# Patient Record
Sex: Male | Born: 1955
Health system: Southern US, Community
[De-identification: ages and names within clinical notes are randomized; demographics above are authoritative.]

## PROBLEM LIST (undated history)

## (undated) DIAGNOSIS — M109 Gout, unspecified: Secondary | ICD-10-CM

## (undated) DIAGNOSIS — J189 Pneumonia, unspecified organism: Secondary | ICD-10-CM

## (undated) DIAGNOSIS — R0600 Dyspnea, unspecified: Secondary | ICD-10-CM

## (undated) DIAGNOSIS — K219 Gastro-esophageal reflux disease without esophagitis: Secondary | ICD-10-CM

## (undated) DIAGNOSIS — L97909 Non-pressure chronic ulcer of unspecified part of unspecified lower leg with unspecified severity: Secondary | ICD-10-CM

## (undated) DIAGNOSIS — I10 Essential (primary) hypertension: Secondary | ICD-10-CM

## (undated) DIAGNOSIS — K859 Acute pancreatitis without necrosis or infection, unspecified: Secondary | ICD-10-CM

## (undated) DIAGNOSIS — K649 Unspecified hemorrhoids: Secondary | ICD-10-CM

## (undated) DIAGNOSIS — I340 Nonrheumatic mitral (valve) insufficiency: Secondary | ICD-10-CM

## (undated) DIAGNOSIS — J449 Chronic obstructive pulmonary disease, unspecified: Secondary | ICD-10-CM

## (undated) DIAGNOSIS — M1990 Unspecified osteoarthritis, unspecified site: Secondary | ICD-10-CM

## (undated) DIAGNOSIS — D126 Benign neoplasm of colon, unspecified: Secondary | ICD-10-CM

## (undated) DIAGNOSIS — L0232 Furuncle of buttock: Secondary | ICD-10-CM

## (undated) DIAGNOSIS — R7989 Other specified abnormal findings of blood chemistry: Secondary | ICD-10-CM

## (undated) DIAGNOSIS — R945 Abnormal results of liver function studies: Secondary | ICD-10-CM

## (undated) DIAGNOSIS — I071 Rheumatic tricuspid insufficiency: Secondary | ICD-10-CM

## (undated) DIAGNOSIS — J42 Unspecified chronic bronchitis: Secondary | ICD-10-CM

## (undated) HISTORY — DX: Acute pancreatitis without necrosis or infection, unspecified: K85.90

## (undated) HISTORY — DX: Unspecified hemorrhoids: K64.9

## (undated) HISTORY — DX: Nonrheumatic mitral (valve) insufficiency: I34.0

## (undated) HISTORY — DX: Benign neoplasm of colon, unspecified: D12.6

## (undated) HISTORY — DX: Abnormal results of liver function studies: R94.5

## (undated) HISTORY — DX: Other specified abnormal findings of blood chemistry: R79.89

## (undated) HISTORY — DX: Hypomagnesemia: E83.42

## (undated) HISTORY — DX: Rheumatic tricuspid insufficiency: I07.1

## (undated) HISTORY — DX: Non-pressure chronic ulcer of unspecified part of unspecified lower leg with unspecified severity: L97.909

## (undated) HISTORY — PX: TONSILLECTOMY: SUR1361

---

## 2003-09-15 ENCOUNTER — Emergency Department (HOSPITAL_COMMUNITY): Admission: EM | Admit: 2003-09-15 | Discharge: 2003-09-16 | Payer: Self-pay | Admitting: Emergency Medicine

## 2003-09-20 ENCOUNTER — Inpatient Hospital Stay (HOSPITAL_COMMUNITY): Admission: EM | Admit: 2003-09-20 | Discharge: 2003-09-23 | Payer: Self-pay | Admitting: Emergency Medicine

## 2003-10-11 ENCOUNTER — Emergency Department (HOSPITAL_COMMUNITY): Admission: EM | Admit: 2003-10-11 | Discharge: 2003-10-11 | Payer: Self-pay | Admitting: Family Medicine

## 2004-05-01 ENCOUNTER — Ambulatory Visit: Payer: Self-pay | Admitting: Internal Medicine

## 2004-05-11 ENCOUNTER — Ambulatory Visit: Payer: Self-pay | Admitting: Family Medicine

## 2004-05-11 ENCOUNTER — Ambulatory Visit: Payer: Self-pay | Admitting: *Deleted

## 2004-07-07 ENCOUNTER — Ambulatory Visit: Payer: Self-pay | Admitting: Internal Medicine

## 2004-08-01 ENCOUNTER — Ambulatory Visit: Payer: Self-pay | Admitting: Family Medicine

## 2004-11-27 ENCOUNTER — Ambulatory Visit: Payer: Self-pay | Admitting: Family Medicine

## 2005-04-03 ENCOUNTER — Ambulatory Visit: Payer: Self-pay | Admitting: Family Medicine

## 2005-06-15 ENCOUNTER — Ambulatory Visit: Payer: Self-pay | Admitting: Family Medicine

## 2005-06-28 ENCOUNTER — Ambulatory Visit: Payer: Self-pay | Admitting: Family Medicine

## 2005-07-02 ENCOUNTER — Ambulatory Visit: Payer: Self-pay | Admitting: Family Medicine

## 2005-07-09 ENCOUNTER — Ambulatory Visit: Payer: Self-pay | Admitting: Internal Medicine

## 2005-12-14 ENCOUNTER — Ambulatory Visit: Payer: Self-pay | Admitting: Family Medicine

## 2006-07-01 ENCOUNTER — Ambulatory Visit: Payer: Self-pay | Admitting: Family Medicine

## 2006-10-09 ENCOUNTER — Ambulatory Visit: Payer: Self-pay | Admitting: Family Medicine

## 2007-05-02 DIAGNOSIS — A630 Anogenital (venereal) warts: Secondary | ICD-10-CM | POA: Insufficient documentation

## 2007-05-02 DIAGNOSIS — J4530 Mild persistent asthma, uncomplicated: Secondary | ICD-10-CM | POA: Insufficient documentation

## 2007-05-02 DIAGNOSIS — J309 Allergic rhinitis, unspecified: Secondary | ICD-10-CM

## 2007-05-02 DIAGNOSIS — I10 Essential (primary) hypertension: Secondary | ICD-10-CM | POA: Insufficient documentation

## 2007-05-02 DIAGNOSIS — K859 Acute pancreatitis without necrosis or infection, unspecified: Secondary | ICD-10-CM | POA: Insufficient documentation

## 2007-05-02 DIAGNOSIS — J3089 Other allergic rhinitis: Secondary | ICD-10-CM | POA: Insufficient documentation

## 2007-05-02 DIAGNOSIS — K219 Gastro-esophageal reflux disease without esophagitis: Secondary | ICD-10-CM | POA: Insufficient documentation

## 2007-05-02 DIAGNOSIS — B009 Herpesviral infection, unspecified: Secondary | ICD-10-CM | POA: Insufficient documentation

## 2007-05-07 ENCOUNTER — Encounter (INDEPENDENT_AMBULATORY_CARE_PROVIDER_SITE_OTHER): Payer: Self-pay | Admitting: *Deleted

## 2007-06-20 ENCOUNTER — Encounter (INDEPENDENT_AMBULATORY_CARE_PROVIDER_SITE_OTHER): Payer: Self-pay | Admitting: Nurse Practitioner

## 2007-09-29 ENCOUNTER — Telehealth (INDEPENDENT_AMBULATORY_CARE_PROVIDER_SITE_OTHER): Payer: Self-pay | Admitting: Nurse Practitioner

## 2007-10-01 ENCOUNTER — Ambulatory Visit: Payer: Self-pay | Admitting: Nurse Practitioner

## 2007-10-01 DIAGNOSIS — B9789 Other viral agents as the cause of diseases classified elsewhere: Secondary | ICD-10-CM | POA: Insufficient documentation

## 2007-10-02 LAB — CONVERTED CEMR LAB
ALT: 33 units/L (ref 0–53)
AST: 38 units/L — ABNORMAL HIGH (ref 0–37)
BUN: 22 mg/dL (ref 6–23)
Basophils Absolute: 0.1 10*3/uL (ref 0.0–0.1)
Basophils Relative: 1 % (ref 0–1)
Calcium: 9 mg/dL (ref 8.4–10.5)
Creatinine, Ser: 1.1 mg/dL (ref 0.40–1.50)
Eosinophils Absolute: 0 10*3/uL (ref 0.0–0.7)
Eosinophils Relative: 0 % (ref 0–5)
HCT: 36.5 % — ABNORMAL LOW (ref 39.0–52.0)
Hemoglobin: 12.1 g/dL — ABNORMAL LOW (ref 13.0–17.0)
MCHC: 33.2 g/dL (ref 30.0–36.0)
MCV: 83.5 fL (ref 78.0–100.0)
Monocytes Absolute: 0.8 10*3/uL (ref 0.1–1.0)
PSA: 0.79 ng/mL (ref 0.10–4.00)
Platelets: 470 10*3/uL — ABNORMAL HIGH (ref 150–400)
RDW: 13.1 % (ref 11.5–15.5)
Total Bilirubin: 0.6 mg/dL (ref 0.3–1.2)

## 2007-10-22 ENCOUNTER — Encounter (INDEPENDENT_AMBULATORY_CARE_PROVIDER_SITE_OTHER): Payer: Self-pay | Admitting: Family Medicine

## 2008-03-31 ENCOUNTER — Telehealth (INDEPENDENT_AMBULATORY_CARE_PROVIDER_SITE_OTHER): Payer: Self-pay | Admitting: Family Medicine

## 2008-05-18 ENCOUNTER — Encounter (INDEPENDENT_AMBULATORY_CARE_PROVIDER_SITE_OTHER): Payer: Self-pay | Admitting: Family Medicine

## 2008-05-19 ENCOUNTER — Telehealth (INDEPENDENT_AMBULATORY_CARE_PROVIDER_SITE_OTHER): Payer: Self-pay | Admitting: Family Medicine

## 2008-05-25 ENCOUNTER — Ambulatory Visit: Payer: Self-pay | Admitting: Family Medicine

## 2008-05-25 DIAGNOSIS — F528 Other sexual dysfunction not due to a substance or known physiological condition: Secondary | ICD-10-CM | POA: Insufficient documentation

## 2008-05-25 DIAGNOSIS — J1189 Influenza due to unidentified influenza virus with other manifestations: Secondary | ICD-10-CM | POA: Insufficient documentation

## 2008-12-23 ENCOUNTER — Telehealth (INDEPENDENT_AMBULATORY_CARE_PROVIDER_SITE_OTHER): Payer: Self-pay | Admitting: Family Medicine

## 2009-06-13 ENCOUNTER — Telehealth (INDEPENDENT_AMBULATORY_CARE_PROVIDER_SITE_OTHER): Payer: Self-pay | Admitting: Nurse Practitioner

## 2009-07-05 ENCOUNTER — Ambulatory Visit: Payer: Self-pay | Admitting: Physician Assistant

## 2009-07-05 DIAGNOSIS — L500 Allergic urticaria: Secondary | ICD-10-CM | POA: Insufficient documentation

## 2009-07-05 DIAGNOSIS — B351 Tinea unguium: Secondary | ICD-10-CM | POA: Insufficient documentation

## 2009-07-05 DIAGNOSIS — M79609 Pain in unspecified limb: Secondary | ICD-10-CM | POA: Insufficient documentation

## 2009-07-05 LAB — CONVERTED CEMR LAB
ALT: 31 units/L (ref 0–53)
Albumin: 4.4 g/dL (ref 3.5–5.2)
Alkaline Phosphatase: 66 units/L (ref 39–117)
CO2: 26 meq/L (ref 19–32)
Glucose, Bld: 80 mg/dL (ref 70–99)
Potassium: 4.3 meq/L (ref 3.5–5.3)
Sodium: 142 meq/L (ref 135–145)
Total Bilirubin: 0.3 mg/dL (ref 0.3–1.2)
Total Protein: 8.2 g/dL (ref 6.0–8.3)

## 2009-07-06 ENCOUNTER — Encounter: Payer: Self-pay | Admitting: Physician Assistant

## 2009-10-10 ENCOUNTER — Telehealth: Payer: Self-pay | Admitting: Physician Assistant

## 2009-11-02 ENCOUNTER — Ambulatory Visit: Payer: Self-pay | Admitting: Internal Medicine

## 2009-11-25 ENCOUNTER — Telehealth: Payer: Self-pay | Admitting: Physician Assistant

## 2009-12-12 ENCOUNTER — Encounter: Payer: Self-pay | Admitting: Physician Assistant

## 2010-01-09 ENCOUNTER — Telehealth: Payer: Self-pay | Admitting: Physician Assistant

## 2010-01-27 ENCOUNTER — Encounter (INDEPENDENT_AMBULATORY_CARE_PROVIDER_SITE_OTHER): Payer: Self-pay | Admitting: *Deleted

## 2010-02-15 ENCOUNTER — Telehealth: Payer: Self-pay | Admitting: Physician Assistant

## 2010-02-15 ENCOUNTER — Ambulatory Visit: Payer: Self-pay | Admitting: Physician Assistant

## 2010-02-15 DIAGNOSIS — K029 Dental caries, unspecified: Secondary | ICD-10-CM | POA: Insufficient documentation

## 2010-02-16 DIAGNOSIS — Z8639 Personal history of other endocrine, nutritional and metabolic disease: Secondary | ICD-10-CM

## 2010-02-16 DIAGNOSIS — Z862 Personal history of diseases of the blood and blood-forming organs and certain disorders involving the immune mechanism: Secondary | ICD-10-CM | POA: Insufficient documentation

## 2010-02-16 LAB — CONVERTED CEMR LAB
ALT: 41 units/L (ref 0–53)
AST: 46 units/L — ABNORMAL HIGH (ref 0–37)
Alkaline Phosphatase: 66 units/L (ref 39–117)
Basophils Absolute: 0 10*3/uL (ref 0.0–0.1)
Basophils Relative: 1 % (ref 0–1)
Cholesterol: 146 mg/dL (ref 0–200)
Creatinine, Ser: 1.02 mg/dL (ref 0.40–1.50)
Eosinophils Absolute: 0.1 10*3/uL (ref 0.0–0.7)
LDL Cholesterol: 92 mg/dL (ref 0–99)
MCHC: 31.7 g/dL (ref 30.0–36.0)
MCV: 84.3 fL (ref 78.0–100.0)
Neutro Abs: 0.9 10*3/uL — ABNORMAL LOW (ref 1.7–7.7)
Neutrophils Relative %: 44 % (ref 43–77)
PSA: 0.36 ng/mL (ref 0.10–4.00)
Platelets: 222 10*3/uL (ref 150–400)
RBC: 4.71 M/uL (ref 4.22–5.81)
Sodium: 141 meq/L (ref 135–145)
TSH: 2.963 microintl units/mL (ref 0.350–4.500)
Total Bilirubin: 0.4 mg/dL (ref 0.3–1.2)
Total CHOL/HDL Ratio: 3.7
Total Protein: 7.8 g/dL (ref 6.0–8.3)
VLDL: 14 mg/dL (ref 0–40)

## 2010-02-17 ENCOUNTER — Encounter: Payer: Self-pay | Admitting: Physician Assistant

## 2010-02-20 DIAGNOSIS — D72819 Decreased white blood cell count, unspecified: Secondary | ICD-10-CM | POA: Insufficient documentation

## 2010-03-03 ENCOUNTER — Ambulatory Visit: Payer: Self-pay | Admitting: Physician Assistant

## 2010-03-04 ENCOUNTER — Encounter: Payer: Self-pay | Admitting: Physician Assistant

## 2010-03-07 LAB — CONVERTED CEMR LAB
Albumin: 4.5 g/dL (ref 3.5–5.2)
Alkaline Phosphatase: 71 units/L (ref 39–117)
Basophils Absolute: 0 10*3/uL (ref 0.0–0.1)
Bilirubin, Direct: 0.1 mg/dL (ref 0.0–0.3)
Eosinophils Relative: 2 % (ref 0–5)
HCT: 42.7 % (ref 39.0–52.0)
Lymphocytes Relative: 43 % (ref 12–46)
Neutrophils Relative %: 42 % — ABNORMAL LOW (ref 43–77)
Platelets: 241 10*3/uL (ref 150–400)
RDW: 13.3 % (ref 11.5–15.5)
Total Bilirubin: 0.5 mg/dL (ref 0.3–1.2)

## 2010-03-13 ENCOUNTER — Telehealth: Payer: Self-pay | Admitting: Physician Assistant

## 2010-03-13 ENCOUNTER — Ambulatory Visit: Payer: Self-pay | Admitting: Physician Assistant

## 2010-03-13 LAB — CONVERTED CEMR LAB
Alkaline Phosphatase: 67 units/L (ref 39–117)
Indirect Bilirubin: 0.4 mg/dL (ref 0.0–0.9)
Total Bilirubin: 0.5 mg/dL (ref 0.3–1.2)
Total Protein: 8.1 g/dL (ref 6.0–8.3)

## 2010-03-15 ENCOUNTER — Telehealth: Payer: Self-pay | Admitting: Physician Assistant

## 2010-03-15 ENCOUNTER — Ambulatory Visit: Payer: Self-pay | Admitting: Physician Assistant

## 2010-03-15 DIAGNOSIS — L84 Corns and callosities: Secondary | ICD-10-CM | POA: Insufficient documentation

## 2010-03-15 LAB — CONVERTED CEMR LAB
Basophils Absolute: 0 10*3/uL (ref 0.0–0.1)
Basophils Relative: 1 % (ref 0–1)
Eosinophils Absolute: 0.2 10*3/uL (ref 0.0–0.7)
Eosinophils Relative: 7 % — ABNORMAL HIGH (ref 0–5)
HCT: 42.6 % (ref 39.0–52.0)
Hemoglobin: 13.5 g/dL (ref 13.0–17.0)
MCHC: 31.7 g/dL (ref 30.0–36.0)
Monocytes Absolute: 0.3 10*3/uL (ref 0.1–1.0)
RDW: 13.8 % (ref 11.5–15.5)

## 2010-03-21 ENCOUNTER — Ambulatory Visit: Payer: Self-pay | Admitting: Oncology

## 2010-03-24 ENCOUNTER — Encounter: Payer: Self-pay | Admitting: Physician Assistant

## 2010-03-24 LAB — CBC WITH DIFFERENTIAL/PLATELET
BASO%: 0.7 % (ref 0.0–2.0)
Basophils Absolute: 0 10*3/uL (ref 0.0–0.1)
HCT: 40 % (ref 38.4–49.9)
HGB: 12.9 g/dL — ABNORMAL LOW (ref 13.0–17.1)
MCHC: 32.3 g/dL (ref 32.0–36.0)
MONO#: 0.3 10*3/uL (ref 0.1–0.9)
NEUT%: 38.5 % — ABNORMAL LOW (ref 39.0–75.0)
WBC: 2.7 10*3/uL — ABNORMAL LOW (ref 4.0–10.3)
lymph#: 1.2 10*3/uL (ref 0.9–3.3)

## 2010-03-24 LAB — COMPREHENSIVE METABOLIC PANEL
AST: 31 U/L (ref 0–37)
Albumin: 4.2 g/dL (ref 3.5–5.2)
BUN: 11 mg/dL (ref 6–23)
Calcium: 9.4 mg/dL (ref 8.4–10.5)
Chloride: 102 mEq/L (ref 96–112)
Potassium: 3.5 mEq/L (ref 3.5–5.3)
Total Protein: 8.1 g/dL (ref 6.0–8.3)

## 2010-03-24 LAB — CHCC SMEAR

## 2010-03-31 ENCOUNTER — Encounter: Payer: Self-pay | Admitting: Physician Assistant

## 2010-04-21 ENCOUNTER — Encounter: Payer: Self-pay | Admitting: Physician Assistant

## 2010-04-21 DIAGNOSIS — D708 Other neutropenia: Secondary | ICD-10-CM | POA: Insufficient documentation

## 2010-05-08 ENCOUNTER — Telehealth: Payer: Self-pay | Admitting: Physician Assistant

## 2010-06-23 ENCOUNTER — Ambulatory Visit: Payer: Self-pay | Admitting: Oncology

## 2010-08-07 ENCOUNTER — Ambulatory Visit: Payer: Self-pay | Admitting: Oncology

## 2010-08-08 ENCOUNTER — Encounter (INDEPENDENT_AMBULATORY_CARE_PROVIDER_SITE_OTHER): Payer: Self-pay | Admitting: Nurse Practitioner

## 2010-08-08 LAB — CBC WITH DIFFERENTIAL/PLATELET
BASO%: 0.3 % (ref 0.0–2.0)
EOS%: 4.7 % (ref 0.0–7.0)
Eosinophils Absolute: 0.1 10*3/uL (ref 0.0–0.5)
MCH: 28 pg (ref 27.2–33.4)
MCHC: 33.2 g/dL (ref 32.0–36.0)
MCV: 84.3 fL (ref 79.3–98.0)
MONO%: 19.3 % — ABNORMAL HIGH (ref 0.0–14.0)
NEUT#: 1 10*3/uL — ABNORMAL LOW (ref 1.5–6.5)
RBC: 4.45 10*6/uL (ref 4.20–5.82)
RDW: 13.9 % (ref 11.0–14.6)

## 2010-09-08 ENCOUNTER — Encounter (INDEPENDENT_AMBULATORY_CARE_PROVIDER_SITE_OTHER): Payer: Self-pay | Admitting: Nurse Practitioner

## 2010-09-19 NOTE — Letter (Signed)
Summary: [facility]  [facility]   Imported By: Arta Bruce 03/31/2010 10:38:16  _____________________________________________________________________  External Attachment:    Type:   Image     Comment:   External Document

## 2010-09-19 NOTE — Progress Notes (Signed)
Summary: MED REFILLS  Phone Note Refill Request Call back at [phone]   Caller: Patient Summary of Call: PT NEEDS REFILL ON MEDS, STILL HAVING A COLD  TO BREAK UP COUGH AND CONGESTION/LR  Initial call taken by: Oscar La,  May 08, 2010 4:41 PM  Follow-up for Phone Call        forward to provider Follow-up by: Armenia Shannon,  May 08, 2010 5:16 PM  Additional Follow-up for Phone Call Additional follow up Details #1::        What does he need refilled? Tereso Newcomer PA-C  May 08, 2010 5:56 PM     Additional Follow-up for Phone Call Additional follow up Details #2::    TESSALON PEARLS Follow-up by: Oscar La,  May 09, 2010 11:59 AM  Additional Follow-up for Phone Call Additional follow up Details #3:: Details for Additional Follow-up Action Taken: Rx in basket to send to his pharmacy. If having significant cough, shortness of breath, wheezing, etc., he really should be seen for an appt. If symptoms getting better and he just needs the medicine for cough, ok.  Tereso Newcomer PA-C  May 09, 2010 1:58 PM   pt says he just have coughing and congestion.... Armenia Shannon  May 09, 2010 2:29 PM   New/Updated Medications: TESSALON PERLES 100 MG CAPS (BENZONATATE) Take 1 capsule by mouth three times a day as needed for cough Prescriptions: TESSALON PERLES 100 MG CAPS (BENZONATATE) Take 1 capsule by mouth three times a day as needed for cough  #30 x 0   Entered by:   Tereso Newcomer PA-C   Authorized by:   Armenia Shannon   Signed by:   Tereso Newcomer PA-C on 05/09/2010   Method used:   Printed then faxed to ...       [facility] (retail)       [address] [place], [place]  [address]       Ph: [phone]       Fax: [phone]   RxID:   [id number]

## 2010-09-19 NOTE — Progress Notes (Signed)
  Phone Note Call from Patient   Summary of Call: pt says the provider increased the advair and he has been having problem breathing.... pt says this has started when he increased the med which was last week...Marland Kitchen pt says he went back down to normal dose to see if his breathing gets better and it has got better with old dose.... pt would like to know what to do.... [facility]..... Initial call taken by: Armenia Shannon RMA,  March 13, 2010 12:29 PM  Follow-up for Phone Call        Was he using samples that I gave him? If so, they only have a week of medicine in them.  Question if he was out and using an empty inhaler. If the above not true, is he feeling better now? If he is not breathing better or wheezing a lot or using his rescue inhaler a lot, he needs an appt.  Can see Aggie Cosier for a nurse visit if nothing available.  Follow-up by: Tereso Newcomer PA-C,  March 14, 2010 5:47 AM  Additional Follow-up for Phone Call Additional follow up Details #1::        pt is here in office and i told him Catlyn Shipton's response Additional Follow-up by: Armenia Shannon,  March 15, 2010 9:28 AM

## 2010-09-19 NOTE — Assessment & Plan Note (Signed)
Summary: fu with Stephen Whitaker (Asthma and HTN) ///gk   Vital Signs:  Patient profile:   55 year old male Height:      72.5 inches Weight:      195 pounds BMI:     26.18 O2 Sat:      99 % on Room air Temp:     98.1 degrees F oral Pulse rate:   90 / minute Pulse rhythm:   regular Resp:     18 per minute BP sitting:   128 / 81  (left arm) Cuff size:   large  Vitals Entered By: Armenia Shannon (February 15, 2010 11:53 AM)  O2 Flow:  Room air CC: f/u .Marland Kitchen.. pt needs refill on ibuprofen... pt wants to know if there is another med like viagra... pt says his feet still bother him... pt wants a referral to the denist, Hypertension Management Is Patient Diabetic? No Pain Assessment Patient in pain? no       Does patient need assistance? Functional Status Self care Ambulation Normal Comments PF  1.   460    2.   370    3.   380   Primary Care Provider:  Tereso Newcomer PA-C  CC:  f/u .Marland Kitchen.. pt needs refill on ibuprofen... pt wants to know if there is another med like viagra... pt says his feet still bother him... pt wants a referral to the denist and Hypertension Management.  History of Present Illness: Here for f/u.  HTN:  Doing well.  No side effects with meds.  Asthma:  This time of year is worse for him.  Also, working in a factory now.  He uses the ventolin more. See below.   Dental:  Needs to see dentist for chipped tooth.  Has receding gums as well.  No fevers or swelling.  ED:  Wants Rx for viagra to take to outside pharmacy.  Onychomycosis:  Did go to Podiatry clinic.  Never started Lamisil.  Podiatrist wanted to do culture on toenail first.  Patient is to call his office.         Asthma History    Asthma Control Assessment:    Age range: 12+ years    Symptoms: 0-2 days/week    Nighttime Awakenings: 0-2/month    Interferes w/ normal activity: no limitations    SABA use (not for EIB): >2 days/week    Asthma Control Assessment: Not Well Controlled  Hypertension History:  He complains of headache, but denies chest pain, dyspnea with exertion, and syncope.        Positive major cardiovascular risk factors include male age 33 years old or older, hypertension, and family history for ischemic heart disease (males less than 67 years old).  Negative major cardiovascular risk factors include non-tobacco-user status.      Problems Prior to Update: 1)  Pain in Limb  (ICD-729.5) 2)  Onychomycosis  (ICD-110.1) 3)  Allergic Urticaria  (ICD-708.0) 4)  Erectile Dysfunction  (ICD-302.72) 5)  Influenza  (ICD-487.8) 6)  Health Screening  (ICD-V70.0) 7)  Viral Infection  (ICD-079.99) 8)  Condyloma Acuminatum  (ICD-078.11) 9)  Hsv  (ICD-054.9) 10)  Allergic Rhinitis  (ICD-477.9) 11)  Gerd  (ICD-530.81) 12)  Pancreatitis  (ICD-577.0) 13)  Hypertension  (ICD-401.9) 14)  Asthma  (ICD-493.90)  Current Medications (verified): 1)  Ibuprofen 800 Mg  Tabs (Ibuprofen) .Marland Kitchen.. 1 Tablet By Mouth Every 8 Hours As Needed For Pain 2)  Allegra 180 Mg  Tabs (Fexofenadine Hcl) .... Once Daily As Needed  Allergies 3)  Singulair 10 Mg  Tabs (Montelukast Sodium) .... Once Daily 4)  Advair Diskus 250-50 Mcg/dose  Misc (Fluticasone-Salmeterol) .... One Inhalation Two Times A Day Rinse and Spit After Use 5)  Ester-C   Tbcr (Bioflavonoid Products) 6)  Viagra 100 Mg  Tabs (Sildenafil Citrate) .... Take 1/2 To 1 Tablet By Mouth 30-60 Minutes Prior To Sex.limit Use To Once in 24hrs 7)  Hydrochlorothiazide 25 Mg  Tabs (Hydrochlorothiazide) .... Take 1 Tablet By Mouth Every Morning 8)  Protonix 40 Mg  Tbec (Pantoprazole Sodium) .... Take 1 Tablet By Mouth Once A Day 9)  Ventolin Hfa 108 (90 Base) Mcg/act Aers (Albuterol Sulfate) .... 2 Puffs Every 6 Hours As Needed For Shortness of Breath 10)  Lotrisone 1-0.05 %  Crea (Clotrimazole-Betamethasone) .... Apply To Affected Area Two Times A Day As Needed 11)  Nasacort Aq 55 Mcg/act  Aers (Triamcinolone Acetonide(Nasal)) .Marland Kitchen.. 1 Spray in Each Nostril Twice  Daily 12)  Klor-Con M20 20 Meq Cr-Tabs (Potassium Chloride Crys Cr) .Marland Kitchen.. 1 Tablet By Mouth Daily 13)  Creon 24000 Unit Cpep (Pancrelipase (Lip-Prot-Amyl)) .Marland Kitchen.. 1 Capsule By Mouth Three Times A Day Before Meals 14)  Valtrex 1 Gm Tabs (Valacyclovir Hcl) .Marland Kitchen.. 1 Tablet By Mouth Daily X 5 Days or Outbreak 15)  Olux 0.05 % Foam (Clobetasol Propionate) .... Apply Two Times A Day As Needed To Rash 16)  Lamisil 250 Mg Tabs (Terbinafine Hcl) .... Take 1 Tablet By Mouth Once A Day  Allergies (verified): No Known Drug Allergies  Past History:  Past Medical History: Last updated: 05/25/2008  CONDYLOMA ACUMINATUM (ICD-078.11) HSV (ICD-054.9) ALLERGIC RHINITIS (ICD-477.9) GERD (ICD-530.81) PANCREATITIS (ICD-577.0) HYPERTENSION (ICD-401.9) ASTHMA (ICD-493.90)  Social History: Never Smoked Occupation: works in Psychologist, counselling Divorced 1 son rare alcohol  Physical Exam  General:  alert, well-developed, and well-nourished.   Head:  normocephalic and atraumatic.   Neck:  supple.   Lungs:  normal breath sounds, no crackles, and no wheezes.   Heart:  normal rate and regular rhythm.   Abdomen:  soft, non-tender, normal bowel sounds, and no hepatomegaly.   Extremities:  no edema  Neurologic:  alert & oriented X3 and cranial nerves II-XII intact.   Psych:  normally interactive.     Impression & Recommendations:  Problem # 1:  ASTHMA (ICD-493.90) increase Advair to 500/50 for the summer months when he has increased dyspnea with the humidity  His updated medication list for this problem includes:    Singulair 10 Mg Tabs (Montelukast sodium) ..... Once daily    Advair Diskus 500-50 Mcg/dose Aepb (Fluticasone-salmeterol) .Marland Kitchen... 1 inhalation two times a day    Ventolin Hfa 108 (90 Base) Mcg/act Aers (Albuterol sulfate) .Marland Kitchen... 2 puffs every 6 hours as needed for shortness of breath  Problem # 2:  ONYCHOMYCOSIS (ICD-110.1) no lamisil yet to see podiatrist first  His updated medication list  for this problem includes:    Lotrisone 1-0.05 % Crea (Clotrimazole-betamethasone) .Marland Kitchen... Apply to affected area two times a day as needed    Lamisil 250 Mg Tabs (Terbinafine hcl) .Marland Kitchen... Take 1 tablet by mouth once a day  Problem # 3:  ERECTILE DYSFUNCTION (ICD-302.72) needs paper Rx to take to outside pharmacy  His updated medication list for this problem includes:    Viagra 100 Mg Tabs (Sildenafil citrate) .Marland Kitchen... Take 1/2 to 1 tablet by mouth 30-60 minutes prior to sex.limit use to once in 24hrs  Problem # 4:  DENTAL CARIES (ICD-521.00)  Orders: Dental Referral (Dentist)  Problem # 5:  HEALTH SCREENING (ICD-V70.0) refer for screening colo wants PSA wants to wait on pneumovax and Td schedule CPE   Orders: T-Comprehensive Metabolic Panel [id number]) T-Lipid Profile (09811-91478) T-CBC w/Diff (29562-13086) T-TSH (57846-96295) T-PSA (28413-24401) T-Hemoccult Cards-Multiple (02725) Gastroenterology Referral (GI)  Problem # 6:  GERD (ICD-530.81) refill protonix  His updated medication list for this problem includes:    Protonix 40 Mg Tbec (Pantoprazole sodium) .Marland Kitchen... Take 1 tablet by mouth once a day  Problem # 7:  PANCREATITIS (ICD-577.0) stable with pancreatic enzymes  Problem # 8:  HYPERTENSION (ICD-401.9) stable  His updated medication list for this problem includes:    Hydrochlorothiazide 25 Mg Tabs (Hydrochlorothiazide) .Marland Kitchen... Take 1 tablet by mouth every morning  Orders: T-Comprehensive Metabolic Panel [id number]) T-Lipid Profile (25956-38756) T-CBC w/Diff (43329-51884) T-TSH (16606-30160)  Complete Medication List: 1)  Ibuprofen 800 Mg Tabs (Ibuprofen) .Marland Kitchen.. 1 tablet by mouth every 8 hours as needed for pain 2)  Allegra 180 Mg Tabs (Fexofenadine hcl) .... Once daily as needed allergies 3)  Singulair 10 Mg Tabs (Montelukast sodium) .... Once daily 4)  Advair Diskus 500-50 Mcg/dose Aepb (Fluticasone-salmeterol) .Marland Kitchen.. 1 inhalation two times a day 5)  Ester-c Tbcr  (Bioflavonoid products) 6)  Viagra 100 Mg Tabs (Sildenafil citrate) .... Take 1/2 to 1 tablet by mouth 30-60 minutes prior to sex.limit use to once in 24hrs 7)  Hydrochlorothiazide 25 Mg Tabs (Hydrochlorothiazide) .... Take 1 tablet by mouth every morning 8)  Protonix 40 Mg Tbec (Pantoprazole sodium) .... Take 1 tablet by mouth once a day 9)  Ventolin Hfa 108 (90 Base) Mcg/act Aers (Albuterol sulfate) .... 2 puffs every 6 hours as needed for shortness of breath 10)  Lotrisone 1-0.05 % Crea (Clotrimazole-betamethasone) .... Apply to affected area two times a day as needed 11)  Nasacort Aq 55 Mcg/act Aers (Triamcinolone acetonide(nasal)) .Marland Kitchen.. 1 spray in each nostril twice daily 12)  Klor-con M20 20 Meq Cr-tabs (Potassium chloride crys cr) .Marland Kitchen.. 1 tablet by mouth daily 13)  Creon 24000 Unit Cpep (Pancrelipase (lip-prot-amyl)) .Marland Kitchen.. 1 capsule by mouth three times a day before meals 14)  Valtrex 1 Gm Tabs (Valacyclovir hcl) .Marland Kitchen.. 1 tablet by mouth daily x 5 days or outbreak 15)  Olux 0.05 % Foam (Clobetasol propionate) .... Apply two times a day as needed to rash 16)  Lamisil 250 Mg Tabs (Terbinafine hcl) .... Take 1 tablet by mouth once a day  Hypertension Assessment/Plan:      The patient's hypertensive risk group is category B: At least one risk factor (excluding diabetes) with no target organ damage.  Today's blood pressure is 128/81.  His blood pressure goal is < 140/90.  Patient Instructions: 1)  If you start on Lamisil, call our office to get LFTs done 6 weeks after starting. 2)  Please schedule a follow-up appointment in 3-4  months with Vannia Pola for CPE.  Prescriptions: PROTONIX 40 MG  TBEC (PANTOPRAZOLE SODIUM) Take 1 tablet by mouth once a day  #30 x 5   Entered and Authorized by:   Tereso Newcomer PA-C   Signed by:   Tereso Newcomer PA-C on 02/15/2010   Method used:   Faxed to ...       [facility] - [facility] (retail)       [address] [place], [place]   [address]       Ph: [phone]       Fax: [phone]   RxID:  [id number] ADVAIR DISKUS 500-50 MCG/DOSE AEPB (FLUTICASONE-SALMETEROL) 1 inhalation two times a day  #1 x 5   Entered and Authorized by:   Tereso Newcomer PA-C   Signed by:   Tereso Newcomer PA-C on 02/15/2010   Method used:   Faxed to ...       [facility] (retail)       [address] [place], [place]  [address]       Ph: [phone]       Fax: [phone]   RxID:   [id number] IBUPROFEN 800 MG  TABS (IBUPROFEN) 1 tablet by mouth every 8 hours as needed for pain  #60 x 3   Entered and Authorized by:   Tereso Newcomer PA-C   Signed by:   Tereso Newcomer PA-C on 02/15/2010   Method used:   Faxed to ...       [facility] - [facility] (retail)       [address] [place], [place]  [address]       Ph: [phone]       Fax: [phone]   RxID:   [id number] VIAGRA 100 MG  TABS (SILDENAFIL CITRATE) Take 1/2 to 1 tablet by mouth 30-60 minutes prior to sex.Limit use to once in 24hrs  #15 x 5   Entered and Authorized by:   Tereso Newcomer PA-C   Signed by:   Tereso Newcomer PA-C on 02/15/2010   Method used:   Print then Give to Patient   RxID:   [id number]

## 2010-09-19 NOTE — Progress Notes (Signed)
Summary: GI referral  Phone Note Outgoing Call   Summary of Call: Please refer to Dr. Doreatha Martin for screening colo.  Initial call taken by: Brynda Rim,  February 15, 2010 1:13 PM

## 2010-09-19 NOTE — Assessment & Plan Note (Signed)
Summary: FU WITH Sybrina Laning//GK   Vital Signs:  Patient profile:   55 year old male Height:      72.5 inches Weight:      196 pounds BMI:     26.31 O2 Sat:      100 % on Room air Temp:     98.1 degrees F oral Pulse rate:   79 / minute Pulse rhythm:   regular Resp:     18 per minute BP sitting:   113 / 70  (left arm) Cuff size:   large  Vitals Entered By: Armenia Shannon (March 15, 2010 9:17 AM)  O2 Flow:  Room air CC: f/u.... pt says he wants you to look at his left middle toe.... pt wants to know lab results..., Hypertension Management Is Patient Diabetic? No Pain Assessment Patient in pain? no       Does patient need assistance? Functional Status Self care Ambulation Normal Comments PF  1.   410       2.  390     3.  390   Primary Care Provider:  Tereso Newcomer PA-C  CC:  f/u.... pt says he wants you to look at his left middle toe.... pt wants to know lab results... and Hypertension Management.  History of Present Illness: 55 year old male returns for followup.  He was given a sample of Advair 500/50 last time.  These samples only last for a week.  Within the week he started to feel worse.  He went back to 250.  His symptoms improved with this.  He still has some wheezing and uses his Proventil inhaler quite frequently.  The increased humidity recently seems to attribute his symptoms.  I have been following his liver function tests.  His AST has been mildly elevated.  In looking through his records, his AST has been elevated in the past.  Recent AST is normal.  He does a history of chronic pancreatitis.  He denies abdominal pain.  He had recent hepatitis serologies.  These were negative.  I have been following his white count.  This has been low.  Recent peripheral smear demonstrated few mature segmented neutrophils.  I discussed with him today regarding referral to hematology.  He is open to this.  Asthma History    Asthma Control Assessment:    Age range: 12+ years  Symptoms: 0-2 days/week    Nighttime Awakenings: 0-2/month    Interferes w/ normal activity: no limitations    SABA use (not for EIB): >2 days/week    Asthma Control Assessment: Not Well Controlled  Hypertension History:      Positive major cardiovascular risk factors include male age 52 years old or older, hypertension, and family history for ischemic heart disease (males less than 56 years old).  Negative major cardiovascular risk factors include non-tobacco-user status.     Current Medications (verified): 1)  Ibuprofen 800 Mg  Tabs (Ibuprofen) .Marland Kitchen.. 1 Tablet By Mouth Every 8 Hours As Needed For Pain 2)  Allegra 180 Mg  Tabs (Fexofenadine Hcl) .... Once Daily As Needed Allergies 3)  Singulair 10 Mg  Tabs (Montelukast Sodium) .... Once Daily 4)  Advair Diskus 500-50 Mcg/dose Aepb (Fluticasone-Salmeterol) .Marland Kitchen.. 1 Inhalation Two Times A Day 5)  Ester-C   Tbcr (Bioflavonoid Products) 6)  Viagra 100 Mg  Tabs (Sildenafil Citrate) .... Take 1/2 To 1 Tablet By Mouth 30-60 Minutes Prior To Sex.limit Use To Once in 24hrs 7)  Hydrochlorothiazide 25 Mg  Tabs (Hydrochlorothiazide) .... Take 1 Tablet  By Mouth Every Morning 8)  Protonix 40 Mg  Tbec (Pantoprazole Sodium) .... Take 1 Tablet By Mouth Once A Day 9)  Ventolin Hfa 108 (90 Base) Mcg/act Aers (Albuterol Sulfate) .... 2 Puffs Every 6 Hours As Needed For Shortness of Breath 10)  Lotrisone 1-0.05 %  Crea (Clotrimazole-Betamethasone) .... Apply To Affected Area Two Times A Day As Needed 11)  Nasacort Aq 55 Mcg/act  Aers (Triamcinolone Acetonide(Nasal)) .Marland Kitchen.. 1 Spray in Each Nostril Twice Daily 12)  Klor-Con M20 20 Meq Cr-Tabs (Potassium Chloride Crys Cr) .Marland Kitchen.. 1 Tablet By Mouth Daily 13)  Creon 24000 Unit Cpep (Pancrelipase (Lip-Prot-Amyl)) .Marland Kitchen.. 1 Capsule By Mouth Three Times A Day Before Meals 14)  Valtrex 1 Gm Tabs (Valacyclovir Hcl) .Marland Kitchen.. 1 Tablet By Mouth Daily X 5 Days or Outbreak 15)  Olux 0.05 % Foam (Clobetasol Propionate) .... Apply Two Times A  Day As Needed To Rash 16)  Lamisil 250 Mg Tabs (Terbinafine Hcl) .... Take 1 Tablet By Mouth Once A Day  Allergies (verified): No Known Drug Allergies  Physical Exam  General:  alert, well-developed, and well-nourished.   Head:  normocephalic and atraumatic.   Neck:  supple.   Lungs:  normal breath sounds.   few mild exp wheezes at base no rales  Heart:  normal rate and regular rhythm.   Abdomen:  soft, non-tender, no hepatomegaly, and no splenomegaly.   Neurologic:  alert & oriented X3 and cranial nerves II-XII intact.   Psych:  normally interactive.     Impression & Recommendations:  Problem # 1:  ASTHMA (ICD-493.90) Assessment Unchanged will give him another try with advair 500/50 think he ran out of sample and was not getting any med he will call if making him feel worse  His updated medication list for this problem includes:    Singulair 10 Mg Tabs (Montelukast sodium) ..... Once daily    Advair Diskus 500-50 Mcg/dose Aepb (Fluticasone-salmeterol) .Marland Kitchen... 1 inhalation two times a day    Ventolin Hfa 108 (90 Base) Mcg/act Aers (Albuterol sulfate) .Marland Kitchen... 2 puffs every 6 hours as needed for shortness of breath  Problem # 2:  LIVER FUNCTION TESTS, ABNORMAL, HX OF (ICD-V12.2) AST mildly elevated no sig alcohol now used to be heavy drinker has chronic pancreatitis Hep serologies negative  Problem # 3:  OTHER NEUTROPENIA (ICD-288.09)  check HIV refer to heme for eval Orders: T-HIV Antibody  (Reflex) ([id number]) Hematology Referral (Hematology)  Problem # 4:  DENTAL CARIES (ICD-521.00) still waiting on dental clinic  Problem # 5:  CALLUS, TOE (ICD-700) left 2nd toe refer to foot clinic for shaving  Problem # 6:  HYPERTENSION (ICD-401.9) stable  His updated medication list for this problem includes:    Hydrochlorothiazide 25 Mg Tabs (Hydrochlorothiazide) .Marland Kitchen... Take 1 tablet by mouth every morning  Complete Medication List: 1)  Ibuprofen 800 Mg Tabs  (Ibuprofen) .Marland Kitchen.. 1 tablet by mouth every 8 hours as needed for pain 2)  Allegra 180 Mg Tabs (Fexofenadine hcl) .... Once daily as needed allergies 3)  Singulair 10 Mg Tabs (Montelukast sodium) .... Once daily 4)  Advair Diskus 500-50 Mcg/dose Aepb (Fluticasone-salmeterol) .Marland Kitchen.. 1 inhalation two times a day 5)  Ester-c Tbcr (Bioflavonoid products) 6)  Viagra 100 Mg Tabs (Sildenafil citrate) .... Take 1/2 to 1 tablet by mouth 30-60 minutes prior to sex.limit use to once in 24hrs 7)  Hydrochlorothiazide 25 Mg Tabs (Hydrochlorothiazide) .... Take 1 tablet by mouth every morning 8)  Protonix 40 Mg Tbec (Pantoprazole sodium) .Marland KitchenMarland KitchenMarland Kitchen  Take 1 tablet by mouth once a day 9)  Ventolin Hfa 108 (90 Base) Mcg/act Aers (Albuterol sulfate) .... 2 puffs every 6 hours as needed for shortness of breath 10)  Lotrisone 1-0.05 % Crea (Clotrimazole-betamethasone) .... Apply to affected area two times a day as needed 11)  Nasacort Aq 55 Mcg/act Aers (Triamcinolone acetonide(nasal)) .Marland Kitchen.. 1 spray in each nostril twice daily 12)  Klor-con M20 20 Meq Cr-tabs (Potassium chloride crys cr) .Marland Kitchen.. 1 tablet by mouth daily 13)  Creon 24000 Unit Cpep (Pancrelipase (lip-prot-amyl)) .Marland Kitchen.. 1 capsule by mouth three times a day before meals 14)  Valtrex 1 Gm Tabs (Valacyclovir hcl) .Marland Kitchen.. 1 tablet by mouth daily x 5 days or outbreak 15)  Olux 0.05 % Foam (Clobetasol propionate) .... Apply two times a day as needed to rash 16)  Lamisil 250 Mg Tabs (Terbinafine hcl) .... Take 1 tablet by mouth once a day  Hypertension Assessment/Plan:      The patient's hypertensive risk group is category B: At least one risk factor (excluding diabetes) with no target organ damage.  His calculated 10 year risk of coronary heart disease is 6 %.  Today's blood pressure is 113/70.  His blood pressure goal is < 140/90.   Patient Instructions: 1)  Schedule appointment at podiatry clinic at [facility] for shaving of callus on toe. 2)  Start on the Advair 500/50.  Let me  know if you feel worse with this. 3)  Someone will call you about seeing the hematologist.  Call me if you do not hear something in 2 weeks. 4)  Please schedule a follow-up appointment in 2 month with Preslea Rhodus for Asthma.   Laboratory Results    Other Tests  Rapid HIV: negative

## 2010-09-19 NOTE — Progress Notes (Signed)
Summary: podiatry  Phone Note Call from Patient   Summary of Call: Pt did not take Lamisil due to possible side effects, would like to be referred to podiatry instead is this ok with you?  Pt can be reached at [phone]. Initial call taken by: Vesta Mixer CMA,  October 10, 2009 3:44 PM  Follow-up for Phone Call        He can go to podiatry clinic at [facility]. Order on your desk. Follow-up by: Tereso Newcomer PA-C,  October 10, 2009 5:55 PM  Additional Follow-up for Phone Call Additional follow up Details #1::        Left message on answering machine for pt to call back.Marland KitchenMarland KitchenArmenia Shannon  October 11, 2009 3:51 PM  pt is informed.... Armenia Shannon  October 13, 2009 5:21 PM

## 2010-09-19 NOTE — Progress Notes (Signed)
Summary: Hematology referral  Phone Note Outgoing Call   Summary of Call: Please refer to Hematology for Neutropenia.  Initial call taken by: Brynda Rim,  March 15, 2010 2:13 PM

## 2010-09-19 NOTE — Progress Notes (Signed)
  Phone Note Call from Patient Call back at [facility] Phone [phone]   Summary of Call: The pt needs more refills from ventolin medication; he goes to [facility]. weaver PA-c Initial call taken by: Manon Hilding,  November 25, 2009 4:47 PM  Follow-up for Phone Call        forward to provider, last filled on 07-08-09 1 x 3 Follow-up by: Armenia Shannon,  November 28, 2009 4:15 PM  Additional Follow-up for Phone Call Additional follow up Details #1::        pt is aware Additional Follow-up by: Armenia Shannon,  November 29, 2009 11:39 AM    New/Updated Medications: VENTOLIN HFA 108 (90 BASE) MCG/ACT AERS (ALBUTEROL SULFATE) 2 puffs every 6 hours as needed for shortness of breath Prescriptions: VENTOLIN HFA 108 (90 BASE) MCG/ACT AERS (ALBUTEROL SULFATE) 2 puffs every 6 hours as needed for shortness of breath  #1 x 11   Entered and Authorized by:   Tereso Newcomer PA-C   Signed by:   Tereso Newcomer PA-C on 11/28/2009   Method used:   Faxed to ...       [facility] - Pharmac (retail)       [address] [place], [place]  [address]       Ph: [phone]       Fax: [phone]   RxID:   [id number]

## 2010-09-19 NOTE — Progress Notes (Signed)
  Phone Note Outgoing Call   Summary of Call: Needs f/u for Asthma and HTN. Initial call taken by: Brynda Rim,  Jan 09, 2010 10:41 PM  Follow-up for Phone Call        gracelia can you schedule this pt an appt Follow-up by: Armenia Shannon,  Jan 11, 2010 10:27 AM  Additional Follow-up for Phone Call Additional follow up Details #1::        left a message to the pt to call back.Manon Hilding  Jan 11, 2010 2:12 PM  left a message to the pt to call back.Manon Hilding  January 19, 2010 2:13 PM  LEFT A MESSAGE TO THE PT ON THE VOICE MAIL TO CALL us BACK.Manon Hilding  January 25, 2010 10:01 AM    Additional Follow-up for Phone Call Additional follow up Details #2::    Left a message again to the pt  voice mail, ,but he is not caling me back so we might need to send a letter.Manon Hilding  January 26, 2010 4:31 PM    Left message on answering machine for pt to call back.... will mail letter... Armenia Shannon  January 27, 2010 8:44 AM

## 2010-09-19 NOTE — Letter (Signed)
Summary: *HSN Results Follow up  [facility]  [address] [place], [place] [address]   Phone: [phone]  Fax: [phone]      01/27/2010   Stephen Whitaker [address] [place], [place]  [address]   Dear  Mr. Stephen Whitaker,                            ____S.Drinkard,FNP   ____D. Gore,FNP       ____B. McPherson,MD   ____V. Rankins,MD    ____E. Mulberry,MD    ____N. Daphine Deutscher, FNP  ____D. Reche Dixon, MD    ____K. Philipp Deputy, MD    ____Other     This letter is to inform you that your recent test(s):  _______Pap Smear    _______Lab Test     _______X-ray    _______ is within acceptable limits  _______ requires a medication change  _______ requires a follow-up lab visit  ___X____ requires a follow-up visit with your provider   Comments:  We have been trying to reach you.  Please give the office a call to schedule you an appointment with your provider.       _________________________________________________________ If you have any questions, please contact our office                     Sincerely,  Armenia Shannon [facility]

## 2010-09-19 NOTE — Letter (Signed)
Summary: PODIATRY NOTE  PODIATRY NOTE   Imported By: Arta Bruce 01/19/2010 11:52:35  _____________________________________________________________________  External Attachment:    Type:   Image     Comment:   External Document

## 2010-09-19 NOTE — Miscellaneous (Signed)
  Clinical Lists Changes  Problems: Changed problem from OTHER NEUTROPENIA (ICD-288.09) to OTHER NEUTROPENIA (ICD-288.09) - eval by Heme . . Dr. Clelia Croft 8.5.2011 . . .observe WBC for now

## 2010-09-21 NOTE — Letter (Signed)
Summary: HEMATOLOGY/MEDICAL ONCOLOGY  HEMATOLOGY/MEDICAL ONCOLOGY   Imported By: Arta Bruce 09/11/2010 10:30:16  _____________________________________________________________________  External Attachment:    Type:   Image     Comment:   External Document

## 2010-09-21 NOTE — Miscellaneous (Signed)
Summary: Dx addition  Clinical Lists Changes  Problems: Added new problem of LEUKOPENIA, MILD (ICD-288.50) - Followed by hematology

## 2010-09-22 NOTE — Letter (Signed)
Summary: [facility]NEW PT EVAL  [facility]   Imported By: Arta Bruce 04/27/2010 12:50:00  _____________________________________________________________________  External Attachment:    Type:   Image     Comment:   External Document

## 2010-09-22 NOTE — Letter (Signed)
Summary: DENTAL REFERRAL  DENTAL REFERRAL   Imported By: Arta Bruce 02/17/2010 12:42:24  _____________________________________________________________________  External Attachment:    Type:   Image     Comment:   External Document

## 2010-10-13 ENCOUNTER — Telehealth (INDEPENDENT_AMBULATORY_CARE_PROVIDER_SITE_OTHER): Payer: Self-pay | Admitting: Internal Medicine

## 2010-10-17 ENCOUNTER — Encounter: Payer: Self-pay | Admitting: Nurse Practitioner

## 2010-10-17 ENCOUNTER — Telehealth (INDEPENDENT_AMBULATORY_CARE_PROVIDER_SITE_OTHER): Payer: Self-pay | Admitting: Nurse Practitioner

## 2010-10-17 ENCOUNTER — Encounter (INDEPENDENT_AMBULATORY_CARE_PROVIDER_SITE_OTHER): Payer: Self-pay | Admitting: Nurse Practitioner

## 2010-10-17 DIAGNOSIS — J40 Bronchitis, not specified as acute or chronic: Secondary | ICD-10-CM | POA: Insufficient documentation

## 2010-10-17 DIAGNOSIS — M722 Plantar fascial fibromatosis: Secondary | ICD-10-CM | POA: Insufficient documentation

## 2010-10-24 ENCOUNTER — Encounter (INDEPENDENT_AMBULATORY_CARE_PROVIDER_SITE_OTHER): Payer: Self-pay | Admitting: Nurse Practitioner

## 2010-10-24 ENCOUNTER — Telehealth (INDEPENDENT_AMBULATORY_CARE_PROVIDER_SITE_OTHER): Payer: Self-pay | Admitting: Nurse Practitioner

## 2010-10-26 NOTE — Progress Notes (Signed)
Summary: Wants refill of tessalon perles  Phone Note From Pharmacy   Caller: [facility] - Pharmac Summary of Call: Request for Tessalon perles from pharmacy--has not been filled since 2009--please call pt. and see what symptoms he is having.  In future, if he is not on a medication regularly, he should call the office with symptoms and why he wants a med.  To call the pharmacy for regular refills. Initial call taken by: Julieanne Manson MD,  October 13, 2010 4:29 PM  Follow-up for Phone Call        Left message on voicemail for pt. to return call.  Dutch Quint RN  October 13, 2010 4:41 PM   Additional Follow-up for Phone Call Additional follow up Details #1::        Cough and congestion expectorating white/green mucous, lg amt. Having problems getting mucous up. Afebrile, nasal D/C clear and constant. Denies any eye or ear problems. Occasional HA, has been using nyquil with no relief. Appt. scheduled. Additional Follow-up by: Gaylyn Cheers RN,  October 16, 2010 11:33 AM

## 2010-10-26 NOTE — Assessment & Plan Note (Signed)
Summary: Bronchitis/Plantar Fascititis   Vital Signs:  Patient profile:   55 year old male Weight:      190.6 pounds BMI:     25.59 O2 Sat:      99 % on Room air Temp:     98.3 degrees F oral Pulse rate:   98 / minute Pulse rhythm:   regular Resp:     20 per minute BP sitting:   126 / 78  (left arm) Cuff size:   large  Vitals Entered By: Levon Hedger (October 17, 2010 11:22 AM)  Nutrition Counseling: Patient's BMI is greater than 25 and therefore counseled on weight management options.  O2 Flow:  Room air  Serial Vital Signs/Assessments:  Comments: P/F  300, 350,350 By: Levon Hedger   CC: cold that triggered his bronchitis x 3 days, Hypertension Management Is Patient Diabetic? No Pain Assessment Patient in pain? yes     Location: foot Intensity: 7-8  Does patient need assistance? Functional Status Self care Ambulation Normal   Primary Care Provider:  Tereso Newcomer PA-C  CC:  cold that triggered his bronchitis x 3 days and Hypertension Management.  History of Present Illness:  Pt into the office with c/o Upper respiratory symptoms Started 3 days ago Main problem is cough. 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  Hypertension History:      He denies headache, chest pain, and palpitations.  He notes no problems with any antihypertensive medication side effects.        Positive major cardiovascular risk factors include male age 69 years old or older, hypertension, and family history for ischemic heart disease (males less than 68 years old).  Negative major cardiovascular risk factors include non-tobacco-user status.     Allergies (verified): No Known Drug Allergies  Review of Systems General:  Denies fever. ENT:  Complains of hoarseness and  nasal congestion; denies earache and sore throat. CV:  Denies chest pain or discomfort. Resp:  Complains of cough, sputum productive, and wheezing; Chronic bronchitis. GU:  Complains of erectile dysfunction. MS:  started 2 weeks ago. Neuro:  Complains of headaches.  Physical Exam  General:  alert.   Head:  normocephalic.   Lungs:  scattered rhonchi throughout Heart:  normal rate and regular rhythm.   Abdomen:  normal bowel sounds.   Neurologic:  alert & oriented X3.   Psych:  Oriented X3.     Foot/Ankle Exam  Foot Exam:    Right:    Inspection:  Normal    Palpation:  Abnormal    Stability:  stable    Tenderness:  yes    Swelling:  no    Erythema:  no   Impression & Recommendations:  Problem # 1:  PLANTAR FASCIITIS, LEFT (ICD-728.71)  pt advised to get inserts in his shoes advise pt to use a can to roll back and forth to exericse His updated medication list for this problem includes:    Ibuprofen 800 Mg Tabs (Ibuprofen) .Marland Kitchen... 1 tablet by mouth every 8 hours as needed for pain  Orders: Podiatry Referral (Podiatry)  Problem # 2:  BRONCHITIS (ICD-490) pt to start on prednisone  neb given in office The following medications were removed from the medication list:    Tessalon Perles  100 Mg Caps (Benzonatate) .Marland Kitchen... Take 1 capsule by mouth three times a day as needed for cough His updated medication list for this problem includes:    Singulair 10 Mg Tabs (Montelukast sodium) ..... Once daily    Advair Diskus 500-50 Mcg/dose Aepb (Fluticasone-salmeterol) .Marland Kitchen... 1 inhalation two times a day    Ventolin Hfa 108 (90 Base) Mcg/act Aers (Albuterol sulfate) .Marland Kitchen... 2 puffs every 6 hours as needed for shortness of breath  Orders: Albuterol Sulfate Sol 1mg  unit dose (Z6109) Atrovent 1mg  (Neb) [id number]) Admin of Therapeutic Inj  intramuscular or subcutaneous (09811)  Problem # 3:  ONYCHOMYCOSIS (ICD-110.1) pt wants referral to podiatry His updated medication list for this problem  includes:    Lotrisone 1-0.05 % Crea (Clotrimazole-betamethasone) .Marland Kitchen... Apply to affected area two times a day as needed    Lamisil 250 Mg Tabs (Terbinafine hcl) .Marland Kitchen... Take 1 tablet by mouth once a day  Problem # 4:  HYPERTENSION (ICD-401.9)  His updated medication list for this problem includes:    Hydrochlorothiazide 25 Mg Tabs (Hydrochlorothiazide) .Marland Kitchen... Take 1 tablet by mouth every morning  Problem # 5:  ERECTILE DYSFUNCTION (ICD-302.72) pt would like rx for viagra - advised that he could no longer get filled at [facility] His updated medication list for this problem includes:    Viagra 100 Mg Tabs (Sildenafil citrate) .Marland Kitchen... Take 1/2 to 1 tablet by mouth 30-60 minutes prior to sex.limit use to once in 24hrs  Complete Medication List: 1)  Ibuprofen 800 Mg Tabs (Ibuprofen) .Marland Kitchen.. 1 tablet by mouth every 8 hours as needed for pain 2)  Xyzal 5 Mg Tabs (Levocetirizine dihydrochloride) .... One tablet by mouth daily for allergies 3)  Singulair 10 Mg Tabs (Montelukast sodium) .... Once daily 4)  Advair Diskus 500-50 Mcg/dose Aepb (Fluticasone-salmeterol) .Marland Kitchen.. 1 inhalation two times a day 5)  Ester-c Tbcr (Bioflavonoid products) 6)  Viagra 100 Mg Tabs (Sildenafil citrate) .... Take 1/2 to 1 tablet by mouth 30-60 minutes prior to sex.limit use to once in 24hrs 7)  Hydrochlorothiazide 25 Mg Tabs (Hydrochlorothiazide) .... Take 1 tablet by mouth every morning 8)  Protonix 40 Mg Tbec (Pantoprazole sodium) .... Take 1 tablet by mouth once a day 9)  Ventolin Hfa 108 (90 Base) Mcg/act Aers (Albuterol sulfate) .... 2 puffs every 6 hours as needed for shortness of breath 10)  Lotrisone 1-0.05 % Crea (Clotrimazole-betamethasone) .... Apply to affected area two times a day as needed 11)  Nasacort Aq 55 Mcg/act Aers (Triamcinolone acetonide(nasal)) .Marland Kitchen.. 1 spray in each nostril twice daily 12)  Klor-con M20 20 Meq Cr-tabs (Potassium chloride crys cr) .Marland Kitchen.. 1 tablet by mouth daily 13)  Creon 24000 Unit Cpep  (Pancrelipase (lip-prot-amyl)) .Marland Kitchen.. 1 capsule by mouth three times a day before meals 14)  Valtrex 1 Gm Tabs (Valacyclovir hcl) .Marland Kitchen.. 1 tablet by mouth daily x 5 days or outbreak 15)  Olux 0.05 % Foam (Clobetasol propionate) .... Apply two times a day as needed to rash 16)  Lamisil 250 Mg Tabs (Terbinafine hcl) .... Take 1 tablet by mouth once a day  Asthma Management Plan    Asthma Severity: Moderate Persistent    Control Assessment: Not Well Controlled    Personal best PEF: 350 liters/minute    Predicted PEF: 613 liters/minute    Working PEF: 613 liters/minute    Plan based on PEF formula: [employer] [employer] Zone: (Range: 490 to 610) ADVAIR DISKUS 500-50 MCG/DOSE AEPB:  2 puffs every 12 hours SINGULAIR  10 MG  TABS:  1 tablet daily  Yellow Zone:  Red Zone:  Hypertension Assessment/Plan:      The patient's hypertensive risk group is category B: At least one risk factor (excluding diabetes) with no target organ damage.  His calculated 10 year risk of coronary heart disease is 6 %.  Today's blood pressure is 126/78.  His blood pressure goal is < 140/90.   Patient Instructions: 1)  Breathing treatment given today in office 2)  Prednisone started 3)  Lotrisone cream sent to the pharmacy 4)  Will refer to podiatry Prescriptions: LOTRISONE 1-0.05 %  CREA (CLOTRIMAZOLE-BETAMETHASONE) Apply to affected area two times a day as needed  #45grams x 2   Entered and Authorized by:   Lehman Prom FNP   Signed by:   Lehman Prom FNP on 10/17/2010   Method used:   Faxed to ...       [facility] - Pharmac (retail)       [address] [place], [place]  [address]       Ph: [phone]       Fax: [phone]   RxID:   [id number]    Medication Administration  Medication # 1:    Medication: Albuterol Sulfate Sol 1mg  unit dose    Diagnosis: BRONCHITIS (ICD-490)    Dose: 2.5mg     Route: po    Exp Date: 04/2011    Lot #: [id number]    Mfr:  nephron    Comments: ndc  [id number]    Patient tolerated medication without complications    Given by: Levon Hedger (October 17, 2010 1:21 PM)  Medication # 2:    Medication: Atrovent 1mg  (Neb)    Diagnosis: BRONCHITIS (ICD-490)    Dose: 3 ml    Route: po    Exp Date: 01/2011    Lot #: [id number]    Mfr: cobalt    Comments: ndc  [phone]    Patient tolerated medication without complications    Given by: Levon Hedger (October 17, 2010 1:21 PM)  Orders Added: 1)  Est. Patient Level III [99213] 2)  Podiatry Referral [Podiatry] 3)  Albuterol Sulfate Sol 1mg  unit dose [J7613] 4)  Atrovent 1mg  (Neb) [P3295] 5)  Admin of Therapeutic Inj  intramuscular or subcutaneous [96372]     Medication Administration  Medication # 1:    Medication: Albuterol Sulfate Sol 1mg  unit dose    Diagnosis: BRONCHITIS (ICD-490)    Dose: 2.5mg     Route: po    Exp Date: 04/2011    Lot #: [id number]    Mfr: nephron    Comments: SAY  [id number]    Patient tolerated medication without complications    Given by: Levon Hedger (October 17, 2010 1:21 PM)  Medication # 2:    Medication: Atrovent 1mg  (Neb)    Diagnosis: BRONCHITIS (ICD-490)    Dose: 3 ml    Route: po    Exp Date: 01/2011    Lot #: [id number]    Mfr: cobalt    Comments: ndc  [phone]    Patient tolerated medication without complications    Given by: Levon Hedger (October 17, 2010 1:21 PM)  Orders Added: 1)  Est. Patient Level III [42706] 2)  Podiatry Referral [Podiatry] 3)  Albuterol Sulfate Sol 1mg  unit dose [J7613] 4)  Atrovent 1mg  (Neb) [C3762] 5)  Admin of Therapeutic Inj  intramuscular or subcutaneous [83151]

## 2010-10-31 NOTE — Progress Notes (Signed)
Summary: Cough persists  Phone Note Call from Patient   Summary of Call: Pt. called re previous symptoms from last visit - states was given Prenisone, tessalon pearls; pt. states took temp this am, reading was 101.4.  Requests refill on meds, states he does not have $15.00 copay to come in for visit.  Follow-up for Phone Call        States he is still coughing, hurts a little, has headache and body aches, temp has gone down to 99.4, took estra-C and ibuprofen.   Fever started last night, along with aching, chills.  Cough is about the same, had started getting better, but it didn't hurt then when he coughed.  Denies SOB, has been using inhaler, having some wheezing when he's lying down.  States chest is still congested.  Denies eararche or sore throat.  Completed prednisone.  Drinking plenty of water.  Cough is productive of scarce amounts of thick white mucus.  Cough is throughout day/night.  Took Nyquil last night, able to sleep, some relief of symptoms.  Advised per cold/cough protocol, humidify home, keep hydrated, avoid strong perfumes or cleaning products and cigarette smoke.  Use Mucinex to loosen secretions, along with warm showers.   Verbalized understanding and agreement.  Would like to know if tessalon perles and prednisone can be refilled. Follow-up by: Dutch Quint RN,  October 24, 2010 12:32 PM  Additional Follow-up for Phone Call Additional follow up Details #1::        Advise pt to quit smoking  He is take mucinex as indicated above will order a z-pack (rx sent to [facility] pharmacy) he needs to stay hydrated He should use his ventolin as needed and advair as ordered Additional Follow-up by: Lehman Prom FNP,  October 24, 2010 2:23 PM    Additional Follow-up for Phone Call Additional follow up Details #2::    Pt. notified of new Rx and provider's instructions. States he does not smoke.  Verbalized understanding and agreement.  Dutch Quint RN  October 24, 2010 3:17 PM   New/Updated  Medications: AZITHROMYCIN 250 MG TABS (AZITHROMYCIN) 2 tablets by mouth on first day then one tablet by mouth daily Prescriptions: AZITHROMYCIN 250 MG TABS (AZITHROMYCIN) 2 tablets by mouth on first day then one tablet by mouth daily  #6 x 0   Entered and Authorized by:   Lehman Prom FNP   Signed by:   Lehman Prom FNP on 10/24/2010   Method used:   Faxed to ...       [facility] - Pharmac (retail)       [address] [place], [place]  [address]       Ph: [phone]       Fax: [phone]   RxID:   [id number]

## 2010-10-31 NOTE — Progress Notes (Signed)
Summary: Podiatry referral  Phone Note Outgoing Call   Summary of Call: Refer to podiatry Plantar fascitits and long toe nails Initial call taken by: Lehman Prom FNP,  October 17, 2010 1:06 PM  Follow-up for Phone Call        PT HAS AN APPT [facility] 11-17-10 @ 1:15PM T PT AWARE OF HER APPT.Marland KitchenCheryll Whitaker  October 24, 2010 2:48 PM

## 2010-11-16 NOTE — Letter (Signed)
Summary: CALL A NURSE  CALL A NURSE   Imported By: Arta Bruce 11/08/2010 16:42:27  _____________________________________________________________________  External Attachment:    Type:   Image     Comment:   External Document

## 2011-01-05 NOTE — Discharge Summary (Signed)
NAME:  Stephen Whitaker, Stephen Whitaker                      ACCOUNT NO.:  [id number]   MEDICAL RECORD NO.:  [id number]                   PATIENT TYPE:  INP   LOCATION:  6711                                 FACILITY:  [facility]   PHYSICIAN:  Sherin Quarry, MD                   DATE OF BIRTH:  Dec 27, 1955   DATE OF ADMISSION:  09/20/2003  DATE OF DISCHARGE:  09/23/2003                                 DISCHARGE SUMMARY   Stephen Whitaker is a 55 year old man who was initially admitted on January  31.  He presented to the [facility] Emergency Room at that time with a  complaint of difficulty eating and vomiting.  In the emergency room the  patient was incoherent and reported that he could not eat because he was  bothered by bad spirits.  He also said numerous, rather unusual, hyper  religious comments.  He stated that he had lost about 40 pounds over the  last year.  He reported that he had been experiencing chronic nausea and  vomiting.  In the emergency room the patient was found to have an elevated  amylase and lipase and Dr. Deirdre Peer. Polite was contacted to admit the  patient.   PHYSICAL EXAMINATION:  GENERAL:  At the time of admission, the patient was  alert and oriented.  He was in no distress.  He had seemed to experienced  recent weight loss.  CHEST:  Clear.  CARDIOVASCULAR:  Normal S1, normal S2 without rubs, murmurs or gallops.  ABDOMEN:  Benign.  NEUROLOGIC:  Within normal limits.  EXTREMITIES:  No cyanosis, clubbing or edema.   RELEVANT LABORATORY STUDIES OBTAINED:  Include thyroid profile, which was  normal.  CBC revealed a hemoglobin of 13.9, hematocrit of 42.2, white count  was 4,100.  Sodium was initially 128 and it came up to 132 with hydration.  The liver profile was normal.  The amylase was persistently in the range of  350.  Alcohol was less than 5.  A drug screen was done and it was positive  for opiates, presumably because the patient had received pain medication  prior to this  being done.  No drugs of abuse were identified.  Chest x-ray  was normal.  CT scan of the abdomen was completely normal.  The liver,  spleen, adrenal glands, kidneys and gallbladder all looked fine.  There was  no evidence of free fluid and no lymph nodes.  CT of the pelvis looked good.   Initially during the hospitalization, the patient continued to have episodic  confusion.  On the night of February 1, he was noted to be shaking his head  back and forth, speaking incoherently and exhibiting a very unsteady gait.  On February 1 he was seen by speech therapy and underwent a modified barium  swallow, which was normal.  By February 2 there had been a remarkable change  in the patient's behavior.  He was very alert, was completely coherent.  He  exhibited no delusional thinking.  He was not hallucinating.  At that time  he denied depression, anxiety, insomnia or auditory hallucinations.  He  denied any previous history of psychosis.  On February 2 the patient was  seen in consultation by Dr. Antonietta Breach of the psychiatry service.  Dr.  Jeanie Sewer could find no evidence of psychiatric illness.  He was rather  perplexed in trying to understand the patient's transient delusional  behavior.  He questioned whether the patient could have been abusing  alcohol, but there is no evidence that this is the case.  The only  suggestion that he could make was that psychiatry be reconsulted if his  symptoms returned.  By February 3 the patient was eating a regular diet.  There did not seem to be any reason to keep him in the hospital and he was  therefore discharged.  I advised him to identify a primary physician and to  follow-up with that doctor.  He was seen by social services who will assist  him with obtaining medications.   DISCHARGE DIAGNOSES:  1. Elevated amylase with no apparent evidence of pancreatitis.  2. Nausea, vomiting and weight loss of uncertain etiology.  3. History of asthma.  4.  History of acid reflux.  5. Transient delusions, resolved.   DISCHARGE MEDICATIONS:  The patient will be instructed to continue thiamin  100 mg daily and Folate 1 mg daily.  He should also take a multiple vitamin  each day.  He will also take Protonix 40 mg daily.  I also prescribed for  him some Lotrisone 1% cream to apply to his feet on a b.i.d. schedule.  As  he currently has no resources, I suggested that he might want to follow up  with [facility] for his subsequent care.                                                Sherin Quarry, MD    SY/MEDQ  D:  09/23/2003  T:  09/23/2003  Job:  [id number]

## 2011-01-05 NOTE — H&P (Signed)
NAME:  Stephen Whitaker, Stephen Whitaker                      ACCOUNT NO.:  [id number]   MEDICAL RECORD NO.:  [id number]                   PATIENT TYPE:  EMS   LOCATION:  [facility]                                 FACILITY:  [facility]   PHYSICIAN:  Deirdre Peer. Polite, M.D.              DATE OF BIRTH:  [birth year]   DATE OF ADMISSION:  09/20/2003  DATE OF DISCHARGE:                                HISTORY & PHYSICAL   CHIEF COMPLAINT:  Nausea and vomiting.   HISTORY OF PRESENT ILLNESS:  This is a 55 year old male with known history  of hypertension, GERD, and asthmatic bronchitis who presents to the ED for  evaluation secondary to inability to keep food down.  At the time of my  evaluation, the patient is somewhat confused, at time hyper-religious, and  unable to give a good coherent story.  States that food does not stay down  because of bad spirits.  He does admit to weight loss of approximately 40  pounds.  He denies fever or chills.  Does admit to some nausea and vomiting  and, as stated, poor p.o. intake.  The patient denies any new medications,  denies any alcohol or drugs.   In the ED, the patient was evaluated and found to be hemodynamically stable  with lab abnormalities of amylase 351 and lipase 119.  Because of the  patient's lab abnormalities, [facility] have been called to evaluate  the patient for possible admission.   At the time of my exam, the patient is alert and oriented, in no apparent  distress.  Has been noted to be retching and attempting to vomit, but he  states nothing has come up for two days.  When asked about his last meal, he  states that he occasionally has Jello pops and ice pops.   PAST MEDICAL HISTORY:  Per patient, significant for:  1. Hypertension.  2. Bronchitis.  3. Asthma.  4. GERD.   The patient denies diabetes, heart attack, or stroke.  The patient's primary  care has been contacted and confirms above medical problems and no prior  problems with alcoholism  or drugs.   MEDICATIONS:  Per patient, he takes several over-the-counter multivitamins,  [employer] products, nebulizers which include Proventil, Aerobid, and Allegra.  Hydrochlorothiazide for his hypertension.   SOCIAL HISTORY:  Negative for tobacco, alcohol, or drugs.  The patient  states that he works at [employer].   ALLERGIES:  No known drug allergies; however, the patient does admit allergy  to SHELLFISH.   PAST SURGICAL HISTORY:  Negative.   FAMILY HISTORY:  Noncontributory.   PHYSICAL EXAMINATION:  GENERAL:  The patient is alert and oriented.  No  apparent distress.  HEENT:  Anicteric sclerae.  No oral lesions.  The patient does appear to  have sunken eyeballs and some mild temporal wasting.  NECK:  No nodes.  No JVD.  CHEST:  Clear to auscultation, no rales.  CARDIOVASCULAR:  Regular.  No S3.  ABDOMEN:  Soft, nontender.  No hepatosplenomegaly appreciated.  No  tenderness.  EXTREMITIES:  No edema, 2+ pulse.  RECTAL:  Deferred per patient.  NEUROLOGIC:  Nonfocal.   LABORATORY DATA:  White count 4.1, hemoglobin 13.9, MCV 80.9, platelets 398.  BMET: Sodium 131, potassium 3.3, chloride 89, carbon dioxide 32, BUN 31,  creatinine 1.3, AST 42, ALT 33, alkaline phosphatase 79, bilirubin 0.8,  amylase 351, lipase 119.   IMPRESSION AND PLAN:  1. Abnormal laboratories:  Elevated amylase and lipase without history of     alcohol ingestion.  Must rule out pancreatitis; i.e., possible gallstone     pancreatitis.  2. Nausea and vomiting.  3. Questionable weight loss of 40 pounds.  4. Poor p.o. intake secondary to nausea and vomiting.  5. History of bronchitis/asthma.  6. Dehydration secondary to poor p.o. intake.  7. Gastroesophageal reflux disease.  8. Rule out psychosis.   RECOMMENDATIONS:  Patient be admitted to a medicine floor bed for evaluation  and treatment of abnormal labs.  The patient will have a urine drug screen,  will have a CT of the abdomen and pelvis to  rule out pathology related to  the pancreas.  Will have serial amylase and lipase.  The patient will have  judicious IV fluids and replete potassium.  The patient may need a  psychiatric consultation as the patient seems to have some hyper-religious  state at this time and seems completely focused on the fact that he cannot  take food and because of bad spirits.  Also of note, the patient's primary  MD has been called, Dr. Dorothe Pea, and there is no history of any problems of  this nature. Will make further recommendations at the review of the above  studies.                                                Deirdre Peer. Polite, M.D.    RDP/MEDQ  D:  09/20/2003  T:  09/20/2003  Job:  [id number]

## 2011-06-02 ENCOUNTER — Emergency Department (HOSPITAL_COMMUNITY)
Admission: EM | Admit: 2011-06-02 | Discharge: 2011-06-02 | Disposition: A | Discharge status: Home / Self Care | Payer: Self-pay | Attending: Emergency Medicine | Admitting: Emergency Medicine

## 2011-06-02 DIAGNOSIS — I10 Essential (primary) hypertension: Secondary | ICD-10-CM | POA: Insufficient documentation

## 2011-06-02 DIAGNOSIS — Z79899 Other long term (current) drug therapy: Secondary | ICD-10-CM | POA: Insufficient documentation

## 2011-06-02 DIAGNOSIS — R55 Syncope and collapse: Secondary | ICD-10-CM | POA: Insufficient documentation

## 2011-06-02 DIAGNOSIS — I739 Peripheral vascular disease, unspecified: Secondary | ICD-10-CM | POA: Insufficient documentation

## 2011-06-02 DIAGNOSIS — K219 Gastro-esophageal reflux disease without esophagitis: Secondary | ICD-10-CM | POA: Insufficient documentation

## 2011-06-02 DIAGNOSIS — J42 Unspecified chronic bronchitis: Secondary | ICD-10-CM | POA: Insufficient documentation

## 2011-06-02 LAB — GLUCOSE, CAPILLARY: Glucose-Capillary: 83 mg/dL (ref 70–99)

## 2011-06-02 LAB — POCT I-STAT, CHEM 8
Calcium, Ion: 1.17 mmol/L (ref 1.12–1.32)
Glucose, Bld: 95 mg/dL (ref 70–99)
HCT: 45 % (ref 39.0–52.0)
Hemoglobin: 15.3 g/dL (ref 13.0–17.0)
TCO2: 27 mmol/L (ref 0–100)

## 2011-06-03 ENCOUNTER — Emergency Department (HOSPITAL_COMMUNITY)
Admission: EM | Admit: 2011-06-03 | Discharge: 2011-06-03 | Disposition: A | Discharge status: Home / Self Care | Payer: Self-pay | Attending: Emergency Medicine | Admitting: Emergency Medicine

## 2011-06-03 DIAGNOSIS — W19XXXA Unspecified fall, initial encounter: Secondary | ICD-10-CM | POA: Insufficient documentation

## 2011-06-03 DIAGNOSIS — M25519 Pain in unspecified shoulder: Secondary | ICD-10-CM | POA: Insufficient documentation

## 2011-06-03 DIAGNOSIS — Z79899 Other long term (current) drug therapy: Secondary | ICD-10-CM | POA: Insufficient documentation

## 2011-06-03 DIAGNOSIS — I10 Essential (primary) hypertension: Secondary | ICD-10-CM | POA: Insufficient documentation

## 2011-06-03 DIAGNOSIS — S139XXA Sprain of joints and ligaments of unspecified parts of neck, initial encounter: Secondary | ICD-10-CM | POA: Insufficient documentation

## 2011-06-03 DIAGNOSIS — M549 Dorsalgia, unspecified: Secondary | ICD-10-CM | POA: Insufficient documentation

## 2011-06-03 DIAGNOSIS — M542 Cervicalgia: Secondary | ICD-10-CM | POA: Insufficient documentation

## 2011-06-03 DIAGNOSIS — K219 Gastro-esophageal reflux disease without esophagitis: Secondary | ICD-10-CM | POA: Insufficient documentation

## 2011-06-03 DIAGNOSIS — J42 Unspecified chronic bronchitis: Secondary | ICD-10-CM | POA: Insufficient documentation

## 2011-06-03 DIAGNOSIS — Y990 Civilian activity done for income or pay: Secondary | ICD-10-CM | POA: Insufficient documentation

## 2011-06-03 DIAGNOSIS — I739 Peripheral vascular disease, unspecified: Secondary | ICD-10-CM | POA: Insufficient documentation

## 2011-07-16 ENCOUNTER — Telehealth: Payer: Self-pay | Admitting: Oncology

## 2011-07-16 NOTE — Telephone Encounter (Signed)
Approved 100% ind 07/16/11 - 01/13/11. Family 1,total yearly income $96,736.

## 2012-12-11 ENCOUNTER — Encounter: Payer: Self-pay | Admitting: Internal Medicine

## 2013-01-14 ENCOUNTER — Encounter: Payer: Self-pay | Admitting: Internal Medicine

## 2013-01-22 ENCOUNTER — Encounter: Payer: Self-pay | Admitting: Internal Medicine

## 2013-02-05 ENCOUNTER — Encounter: Payer: Self-pay | Admitting: Internal Medicine

## 2014-06-14 ENCOUNTER — Emergency Department (HOSPITAL_COMMUNITY)
Admission: EM | Admit: 2014-06-14 | Discharge: 2014-06-14 | Disposition: A | Discharge status: Home / Self Care | Payer: BC Managed Care – PPO | Attending: Emergency Medicine | Admitting: Emergency Medicine

## 2014-06-14 ENCOUNTER — Emergency Department (HOSPITAL_COMMUNITY): Payer: BC Managed Care – PPO

## 2014-06-14 ENCOUNTER — Telehealth (HOSPITAL_COMMUNITY): Payer: Self-pay | Admitting: Unknown Physician Specialty

## 2014-06-14 ENCOUNTER — Encounter (HOSPITAL_COMMUNITY): Payer: Self-pay | Admitting: Emergency Medicine

## 2014-06-14 DIAGNOSIS — R52 Pain, unspecified: Secondary | ICD-10-CM

## 2014-06-14 DIAGNOSIS — B353 Tinea pedis: Secondary | ICD-10-CM | POA: Diagnosis not present

## 2014-06-14 DIAGNOSIS — R2242 Localized swelling, mass and lump, left lower limb: Secondary | ICD-10-CM | POA: Diagnosis not present

## 2014-06-14 DIAGNOSIS — I10 Essential (primary) hypertension: Secondary | ICD-10-CM | POA: Insufficient documentation

## 2014-06-14 DIAGNOSIS — Z79899 Other long term (current) drug therapy: Secondary | ICD-10-CM | POA: Diagnosis not present

## 2014-06-14 DIAGNOSIS — M7989 Other specified soft tissue disorders: Secondary | ICD-10-CM

## 2014-06-14 DIAGNOSIS — Z791 Long term (current) use of non-steroidal anti-inflammatories (NSAID): Secondary | ICD-10-CM | POA: Insufficient documentation

## 2014-06-14 DIAGNOSIS — Z7951 Long term (current) use of inhaled steroids: Secondary | ICD-10-CM | POA: Insufficient documentation

## 2014-06-14 DIAGNOSIS — M79675 Pain in left toe(s): Secondary | ICD-10-CM | POA: Diagnosis present

## 2014-06-14 HISTORY — DX: Essential (primary) hypertension: I10

## 2014-06-14 MED ORDER — FLUCONAZOLE 100 MG PO TABS
150.0000 mg | ORAL_TABLET | Freq: Once | ORAL | Status: AC
  Administered 2014-06-14: 150 mg via ORAL
  Filled 2014-06-14: qty 2

## 2014-06-14 MED ORDER — ENOXAPARIN SODIUM 100 MG/ML ~~LOC~~ SOLN
1.0000 mg/kg | Freq: Once | SUBCUTANEOUS | Status: AC
  Administered 2014-06-14: 85 mg via SUBCUTANEOUS
  Filled 2014-06-14: qty 1

## 2014-06-14 MED ORDER — FLUCONAZOLE 150 MG PO TABS: 150.0000 mg | ORAL_TABLET | ORAL | Status: DC

## 2014-06-14 NOTE — ED Notes (Signed)
Pt c/o lt 3rd toes pain for several months.  Getting worse for one year .  He was referred to a ortho doctor but he never went to be checked.  He wears steel toed shoes at work

## 2014-06-14 NOTE — ED Provider Notes (Signed)
CSN: [id number]     Arrival date & time 06/14/14  0153 History   First MD Initiated Contact with Patient 06/14/14 0249     Chief Complaint  Patient presents with  . Toe Pain     (Consider location/radiation/quality/duration/timing/severity/associated sxs/prior Treatment) HPI Stephen Whitaker is a 58 y.o. male with a past medical history of hypertension coming in with foot pain. Patient states it is his left foot that is swollen and painful. He states that the bones of his foot have also been shifting. All his symptoms have been going on for approximately 1 year. He states the swelling has been off and on but now it persists. He does describe calf cramping and some tenderness. Had numbness and tingling in that extremity for years. He denies any recent trauma. He had follow-up with orthopedic surgery however he did not make the appointment and did not follow-up after that. He denies any chest pain or shortness of breath. Patient has no further complaints.  10 Systems reviewed and are negative for acute change except as noted in the HPI.     Past Medical History  Diagnosis Date  . Hypertension    History reviewed. No pertinent past surgical history. No family history on file. History  Substance Use Topics  . Smoking status: Never Smoker   . Smokeless tobacco: Not on file  . Alcohol Use: Yes    Review of Systems    Allergies  Shellfish allergy  Home Medications   Prior to Admission medications   Medication Sig Start Date End Date Taking? Authorizing Provider  albuterol (PROVENTIL HFA;VENTOLIN HFA) 108 (90 BASE) MCG/ACT inhaler Inhale 2 puffs into the lungs every 6 (six) hours as needed for wheezing or shortness of breath.   Yes Historical Provider, MD  Bioflavonoid Products (ESTER C PO) Take 1 tablet by mouth daily.   Yes Historical Provider, MD  Digestive Enzymes (DIGESTIVE ENZYME PO) Take 1 capsule by mouth 3 (three) times daily with meals. [employer] brand   Yes Historical  Provider, MD  Fluticasone-Salmeterol (ADVAIR) 500-50 MCG/DOSE AEPB Inhale 1 puff into the lungs 2 (two) times daily.   Yes Historical Provider, MD  hydrochlorothiazide (HYDRODIURIL) 25 MG tablet Take 25 mg by mouth daily.   Yes Historical Provider, MD  ibuprofen (ADVIL,MOTRIN) 800 MG tablet Take 800 mg by mouth 3 (three) times daily.   Yes Historical Provider, MD  loratadine (CLARITIN) 10 MG tablet Take 10 mg by mouth daily.   Yes Historical Provider, MD  Multiple Vitamin (MULTIVITAMIN WITH MINERALS) TABS tablet Take 1 tablet by mouth daily.   Yes Historical Provider, MD  OVER THE COUNTER MEDICATION Take 1 capsule by mouth 3 (three) times daily with meals. Blue/green algae   Yes Historical Provider, MD  pantoprazole (PROTONIX) 40 MG tablet Take 40 mg by mouth daily.   Yes Historical Provider, MD   BP 152/89  Pulse 87  Temp(Src) 98.5 F (36.9 C) (Oral)  Resp 18  Ht 6\' 2"  (1.88 m)  Wt 190 lb (86.183 kg)  BMI 24.38 kg/m2 Physical Exam  Nursing note and vitals reviewed. Constitutional: He is oriented to person, place, and time. Vital signs are normal. He appears well-developed and well-nourished.  Non-toxic appearance. He does not appear ill. No distress.  HENT:  Head: Normocephalic and atraumatic.  Nose: Nose normal.  Mouth/Throat: Oropharynx is clear and moist. No oropharyngeal exudate.  Eyes: Conjunctivae and EOM are normal. Pupils are equal, round, and reactive to light. No scleral icterus.  Neck:  Normal range of motion. Neck supple. No tracheal deviation, no edema, no erythema and normal range of motion present. No mass and no thyromegaly present.  Cardiovascular: Normal rate, regular rhythm, S1 normal, S2 normal, normal heart sounds, intact distal pulses and normal pulses.  Exam reveals no gallop and no friction rub.   No murmur heard. Pulses:      Radial pulses are 2+ on the right side, and 2+ on the left side.       Dorsalis pedis pulses are 2+ on the right side, and 2+ on the left  side.  Pulmonary/Chest: Effort normal and breath sounds normal. No respiratory distress. He has no wheezes. He has no rhonchi. He has no rales.  Abdominal: Soft. Normal appearance and bowel sounds are normal. He exhibits no distension, no ascites and no mass. There is no hepatosplenomegaly. There is no tenderness. There is no rebound, no guarding and no CVA tenderness.  Musculoskeletal: Normal range of motion. He exhibits edema. He exhibits no tenderness.  Left lower extremity 1+ edema to mid tibia. Mild tenderness to palpation of left calf muscle. Distally there are good pulses. There is decreased sensation of the left foot. There is obvious fungal infection to all 10 toes. Worse infection is on the left second toe, beginning to form an indurated area skin.  Lymphadenopathy:    He has no cervical adenopathy.  Neurological: He is alert and oriented to person, place, and time. He has normal strength. No cranial nerve deficit or sensory deficit. GCS eye subscore is 4. GCS verbal subscore is 5. GCS motor subscore is 6.  Skin: Skin is warm, dry and intact. No petechiae and no rash noted. He is not diaphoretic. No erythema. No pallor.  Psychiatric: He has a normal mood and affect. His behavior is normal. Judgment normal.    ED Course  Procedures (including critical care time) Labs Review Labs Reviewed - No data to display  Imaging Review Dg Foot Complete Left  06/14/2014   CLINICAL DATA:  Foot pain, intermittent swelling  EXAM: LEFT FOOT - COMPLETE 3+ VIEW  COMPARISON:  None.  FINDINGS: Lisfranc joint intact. Severe pes planus. Osseous irregularity about the calcaneal cuboid articulation with osseous remodeling. No displaced acute fracture or dislocation.  IMPRESSION: Severe pes planus.  Irregularity about the calcaneocuboid articulation with osseous remodeling, may reflect sequelae of remote trauma/fracture.   Electronically Signed   By: Carlos Levering M.D.   On: 06/14/2014 02:43     EKG  Interpretation None      MDM   Final diagnoses:  Pain    Patient presented to emergency department for left lower stomach pain for the past year. Patient will need ultrasound to evaluate for DVT. He was given Lovenox emergency department and told to follow-up with radiology tomorrow or at the latest Tuesday. Patient was given Diflucan for fungal infection as he's been using clotrimazole without any relief. He'll be given a prescription. He is advised to follow-up with orthopedic surgery regarding his severe pes planus deformity. He demonstrated understanding to this, his vital signs remained within normal limits and he is safe for discharge.    Everlene Balls, MD 06/14/14 [phone]

## 2014-06-14 NOTE — Discharge Instructions (Signed)
Athlete's Foot Stephen Whitaker, he was seen today for pain in her foot. You were given a blood thinner and you need to come back as soon as possible to have an ultrasound of your leg for blood clot. You were also given antifungal's for the infection of her foot. Please take as directed. Follow-up with orthopedic surgery regarding the alignment of the bones on your feet. If any symptoms worsen come back to the emergency department immediately for repeat evaluation. Thank you.  Athlete's foot is a skin infection caused by a fungus. Athlete's foot is often seen between or under the toes. It can also be seen on the bottom of the foot. Athlete's foot can spread to other people by sharing towels or shower stalls. HOME CARE  Only take medicines as told by your doctor. Do not use steroid creams.  Wash your feet daily. Dry your feet well, especially between the toes.  Change your socks every day. Wear cotton or wool socks.  Change your socks 2 to 3 times a day in hot weather.  Wear sandals or canvas tennis shoes with good airflow.  If you have blisters, soak your feet in a solution as told by your doctor. Do this for 20 to 30 minutes, 2 times a day. Dry your feet well after you soak them.  Do not share towels.  Wear sandals when you use shared locker rooms or showers. GET HELP RIGHT AWAY IF:   You have a fever.  Your foot is puffy (swollen), sore, warm, or red.  You are not getting better after 7 days of treatment.  You still have athlete's foot after 30 days.  You have problems caused by your medicine. MAKE SURE YOU:   Understand these instructions.  Will watch your condition.  Will get help right away if you are not doing well or get worse. Document Released: 01/23/2008 Document Revised: 10/29/2011 Document Reviewed: 05/25/2011 [facility] Patient Information 2015 [place], [place]. This information is not intended to replace advice given to you by your health care provider. Make sure you  discuss any questions you have with your health care provider.  Flat Feet Having flat feet is a common condition. One foot or both might be affected. People of any age can have flat feet. In fact, everyone is born with them. But most of the time, the foot gradually develops an arch. That is the curve on the bottom of the foot that creates a gap between the foot and the ground. An arch usually develops in childhood. Sometimes, though, an arch never develops and the foot stays flat on the bottom. Other times, an arch develops but later collapses (caves in). That is what gives the condition its nickname, "fallen arches." The medical term for flat feet is pes planus. Some people have flat feet their whole life and have no problems. For others, the condition causes pain and needs to be corrected.  CAUSES   A problem with the foot's soft tissue; tendons and ligaments could be loose.  This can cause what is called flexible flat feet. That means the shape of the foot changes with pressure. When standing on the toes, a curved arch can be seen. When standing on the ground, the foot is flat.  Wear and tear. Sometimes arches simply flatten over time.  Damage to the posterior tibial tendon. This is the tendon that goes from the inside of the ankle to the bones in the middle of the foot. It is the main support for  the arch. If the tendon is injured, stretched or torn, the arch might flatten.  Tarsal coalition. With this condition, two or more bones in the foot are joined together (fused ) during development in the womb. This limits movement and can lead to a flat foot. SYMPTOMS   The foot is even with the ground from toe to heel. Your caregiver will look closely at the inside of the foot while you are standing.  Pain along the bottom of the foot. Some people describe the pain as tightness.  Swelling on the inside of the foot or ankle.  Changes in the way you walk (gait).  The feet lean inward, starting  at the ankle (pronation). DIAGNOSIS  To decide if a child or adult has flat feet, a healthcare provider will probably:  Do a physical examination. This might include having the person stand on his or her toes and then stand normally. The caregiver will also hold the foot and put pressure on the foot in different directions.  Check the person's shoes. The pattern of wear on the soles can offer clues.  Order images (pictures) of the foot. They can help identify the cause of any pain. They also will show injuries to bones or tendons that could be causing the condition. The images can come from:  X-rays.  Computed tomography (CT) scan. This combines X-ray and a computer.  Magnetic resonance imaging (MRI). This uses magnets, radio waves and a computer to take a picture of the foot. It is the best technique to evaluate tendons, ligaments and muscles. TREATMENT   Flexible flat feet usually are painless. Most of the time, gait is not affected. Most children grow out of the condition. Often no treatment is needed. If there is pain, treatment options include:  Orthotics. These are inserts that go in the shoes. They add support and shape to the feet. An orthotic is custom-made from a mold of the foot.  Shoes. Not all shoes are the same. People with flat feet need arch support. However, too much can be painful. It is important to find shoes that offer the right amount of support. Athletes, especially runners, may need to try shoes made just for people with flatter feet.  Medication. For pain, only take over-the-counter medicine for pain, discomfort, as directed by your caregiver.  Rest. If the feet start to hurt, cut back on the exercise which increases the pain. Use common sense.  For damage to the posterior tibial tendon, options include:  Orthotics. Also adding a wedge on the inside edge may help. This can relieve pressure on the tendon.  Ankle brace, boot or cast. These supports can ease the  load on the tendon while it heals.  Surgery. If the tendon is torn, it might need to be repaired.  For tarsal coalition, similar options apply:  Pain medication.  Orthotics.  A cast and crutches. This keeps weight off the foot.  Physical therapy.  Surgery to remove the bone bridge joining the two bones together. PROGNOSIS  In most people, flat feet do not cause pain or problems. People can go about their normal activities. However, if flat feet are painful, they can and should be treated. Treatment usually relieves the pain. HOME CARE INSTRUCTIONS   Take any medications prescribed by the healthcare provider. Follow the directions carefully.  Wear, or make sure a child wears, orthotics or special shoes if this was suggested. Be sure to ask how often and for how long they should be  worn.  Do any exercises or therapy treatments that were suggested.  Take notes on when the pain occurs. This will help healthcare providers decide how to treat the condition.  If surgery is needed, be sure to find out if there is anything that should or should not be done before the operation. SEEK MEDICAL CARE IF:   Pain worsens in the foot or lower leg.  Pain disappears after treatment, but then returns.  Walking or simple exercise becomes difficult or causes foot pain.  Orthotics or special shoes are uncomfortable or painful. Document Released: 06/03/2009 Document Revised: 10/29/2011 Document Reviewed: 06/03/2009 [facility] Patient Information 2015 [place], [place]. This information is not intended to replace advice given to you by your health care provider. Make sure you discuss any questions you have with your health care provider. Deep Vein Thrombosis A deep vein thrombosis (DVT) is a blood clot that develops in the deep, larger veins of the leg, arm, or pelvis. These are more dangerous than clots that might form in veins near the surface of the body. A DVT can lead to serious and even  life-threatening complications if the clot breaks off and travels in the bloodstream to the lungs.  A DVT can damage the valves in your leg veins so that instead of flowing upward, the blood pools in the lower leg. This is called post-thrombotic syndrome, and it can result in pain, swelling, discoloration, and sores on the leg. CAUSES Usually, several things contribute to the formation of blood clots. Contributing factors include:  The flow of blood slows down.  The inside of the vein is damaged in some way.  You have a condition that makes blood clot more easily. RISK FACTORS Some people are more likely than others to develop blood clots. Risk factors include:   Smoking.  Being overweight (obese).  Sitting or lying still for a long time. This includes long-distance travel, paralysis, or recovery from an illness or surgery. Other factors that increase risk are:   Older age, especially over 33 years of age.  Having a family history of blood clots or if you have already had a blot clot.  Having major or lengthy surgery. This is especially true for surgery on the hip, knee, or belly (abdomen). Hip surgery is particularly high risk.  Having a long, thin tube (catheter) placed inside a vein during a medical procedure.  Breaking a hip or leg.  Having cancer or cancer treatment.  Pregnancy and childbirth.  Hormone changes make the blood clot more easily during pregnancy.  The fetus puts pressure on the veins of the pelvis.  There is a risk of injury to veins during delivery or a caesarean delivery. The risk is highest just after childbirth.  Medicines containing the male hormone estrogen. This includes birth control pills and hormone replacement therapy.  Other circulation or heart problems.  SIGNS AND SYMPTOMS When a clot forms, it can either partially or totally block the blood flow in that vein. Symptoms of a DVT can include:  Swelling of the leg or arm, especially if one  side is much worse.  Warmth and redness of the leg or arm, especially if one side is much worse.  Pain in an arm or leg. If the clot is in the leg, symptoms may be more noticeable or worse when standing or walking. The symptoms of a DVT that has traveled to the lungs (pulmonary embolism, PE) usually start suddenly and include:  Shortness of breath.  Coughing.  Coughing  up blood or blood-tinged mucus.  Chest pain. The chest pain is often worse with deep breaths.  Rapid heartbeat. Anyone with these symptoms should get emergency medical treatment right away. Do not wait to see if the symptoms will go away. Call your local emergency services (911 in the [place] if you have these symptoms. Do not drive yourself to the hospital. DIAGNOSIS If a DVT is suspected, your health care provider will take a full medical history and perform a physical exam. Tests that also may be required include:  Blood tests, including studies of the clotting properties of the blood.  Ultrasound to see if you have clots in your legs or lungs.  X-rays to show the flow of blood when dye is injected into the veins (venogram).  Studies of your lungs if you have any chest symptoms. PREVENTION  Exercise the legs regularly. Take a brisk 30-minute walk every day.  Maintain a weight that is appropriate for your height.  Avoid sitting or lying in bed for long periods of time without moving your legs.  Women, particularly those over the age of 67 years, should consider the risks and benefits of taking estrogen medicines, including birth control pills.  Do not smoke, especially if you take estrogen medicines.  Long-distance travel can increase your risk of DVT. You should exercise your legs by walking or pumping the muscles every hour.  Many of the risk factors above relate to situations that exist with hospitalization, either for illness, injury, or elective surgery. Prevention may include medical and nonmedical  measures.  Your health care provider will assess you for the need for venous thromboembolism prevention when you are admitted to the hospital. If you are having surgery, your surgeon will assess you the day of or day after surgery. TREATMENT Once identified, a DVT can be treated. It can also be prevented in some circumstances. Once you have had a DVT, you may be at increased risk for a DVT in the future. The most common treatment for DVT is blood-thinning (anticoagulant) medicine, which reduces the blood's tendency to clot. Anticoagulants can stop new blood clots from forming and stop old clots from growing. They cannot dissolve existing clots. Your body does this by itself over time. Anticoagulants can be given by mouth, through an IV tube, or by injection. Your health care provider will determine the best program for you. Other medicines or treatments that may be used are:  Heparin or related medicines (low molecular weight heparin) are often the first treatment for a blood clot. They act quickly. However, they cannot be taken orally and must be given either in shot form or by IV tube.  Heparin can cause a fall in a component of blood that stops bleeding and forms blood clots (platelets). You will be monitored with blood tests to be sure this does not occur.  Warfarin is an anticoagulant that can be swallowed. It takes a few days to start working, so usually heparin or related medicines are used in combination. Once warfarin is working, heparin is usually stopped.  Factor Xa inhibitor medicines, such as rivaroxaban and apixaban, also reduce blood clotting. These medicines are taken orally and can often be used without heparin or related medicines.  Less commonly, clot dissolving drugs (thrombolytics) are used to dissolve a DVT. They carry a high risk of bleeding, so they are used mainly in severe cases where your life or a part of your body is threatened.  Very rarely, a blood clot in the  leg needs  to be removed surgically.  If you are unable to take anticoagulants, your health care provider may arrange for you to have a filter placed in a main vein in your abdomen. This filter prevents clots from traveling to your lungs. HOME CARE INSTRUCTIONS  Take all medicines as directed by your health care provider.  Learn as much as you can about DVT.  Wear a medical alert bracelet or carry a medical alert card.  Ask your health care provider how soon you can go back to normal activities. It is important to stay active to prevent blood clots. If you are on anticoagulant medicine, avoid contact sports.  It is very important to exercise. This is especially important while traveling, sitting, or standing for long periods of time. Exercise your legs by walking or by tightening and relaxing your leg muscles regularly. Take frequent walks.  You may need to wear compression stockings. These are tight elastic stockings that apply pressure to the lower legs. This pressure can help keep the blood in the legs from clotting. Taking Warfarin Warfarin is a daily medicine that is taken by mouth. Your health care provider will advise you on the length of treatment (usually 3-6 months, sometimes lifelong). If you take warfarin:  Understand how to take warfarin and foods that can affect how warfarin works in Veterinary surgeon.  Too much and too little warfarin are both dangerous. Too much warfarin increases the risk of bleeding. Too little warfarin continues to allow the risk for blood clots. Warfarin and Regular Blood Testing While taking warfarin, you will need to have regular blood tests to measure your blood clotting time. These blood tests usually include both the prothrombin time (PT) and international normalized ratio (INR) tests. The PT and INR results allow your health care provider to adjust your dose of warfarin. It is very important that you have your PT and INR tested as often as directed by your health care  provider.  Warfarin and Your Diet Avoid major changes in your diet, or notify your health care provider before changing your diet. Arrange a visit with a registered dietitian to answer your questions. Many foods, especially foods high in vitamin K, can interfere with warfarin and affect the PT and INR results. You should eat a consistent amount of foods high in vitamin K. Foods high in vitamin K include:   Spinach, kale, broccoli, cabbage, collard and turnip greens, Brussels sprouts, peas, cauliflower, seaweed, and parsley.  Beef and pork liver.  Green tea.  Soybean oil. Warfarin with Other Medicines Many medicines can interfere with warfarin and affect the PT and INR results. You must:  Tell your health care provider about any and all medicines, vitamins, and supplements you take, including aspirin and other over-the-counter anti-inflammatory medicines. Be especially cautious with aspirin and anti-inflammatory medicines. Ask your health care provider before taking these.  Do not take or discontinue any prescribed or over-the-counter medicine except on the advice of your health care provider or pharmacist. Warfarin Side Effects Warfarin can have side effects, such as easy bruising and difficulty stopping bleeding. Ask your health care provider or pharmacist about other side effects of warfarin. You will need to:  Hold pressure over cuts for longer than usual.  Notify your dentist and other health care providers that you are taking warfarin before you undergo any procedures where bleeding may occur. Warfarin with Alcohol and Tobacco   Drinking alcohol frequently can increase the effect of warfarin, leading to excess bleeding.  It is best to avoid alcoholic drinks or to consume only very small amounts while taking warfarin. Notify your health care provider if you change your alcohol intake.   Do not use any tobacco products including cigarettes, chewing tobacco, or electronic cigarettes.  If you smoke, quit. Ask your health care provider for help with quitting smoking. Alternative Medicines to Warfarin: Factor Xa Inhibitor Medicines  These blood-thinning medicines are taken by mouth, usually for several weeks or longer. It is important to take the medicine every single day at the same time each day.  There are no regular blood tests required when using these medicines.  There are fewer food and drug interactions than with warfarin.  The side effects of this class of medicine are similar to those of warfarin, including excessive bruising or bleeding. Ask your health care provider or pharmacist about other potential side effects. SEEK MEDICAL CARE IF:  You notice a rapid heartbeat.  You feel weaker or more tired than usual.  You feel faint.  You notice increased bruising.  You feel your symptoms are not getting better in the time expected.  You believe you are having side effects of medicine. SEEK IMMEDIATE MEDICAL CARE IF:  You have chest pain.  You have trouble breathing.  You have new or increased swelling or pain in one leg.  You cough up blood.  You notice blood in vomit, in a bowel movement, or in urine. MAKE SURE YOU:  Understand these instructions.  Will watch your condition.  Will get help right away if you are not doing well or get worse. Document Released: 08/06/2005 Document Revised: 12/21/2013 Document Reviewed: 04/13/2013 [facility] Patient Information 2015 [place], [place]. This information is not intended to replace advice given to you by your health care provider. Make sure you discuss any questions you have with your health care provider.

## 2014-06-14 NOTE — ED Notes (Signed)
Pt discharged home with all belongings, pt alert, oriented and ambulatory upon discharge. 1 new rx prescribed, pt drove self home, no narcotics given in ED

## 2014-06-15 ENCOUNTER — Encounter (HOSPITAL_COMMUNITY): Payer: Self-pay | Admitting: Emergency Medicine

## 2014-06-15 ENCOUNTER — Ambulatory Visit (HOSPITAL_COMMUNITY)
Admission: RE | Admit: 2014-06-15 | Discharge: 2014-06-15 | Disposition: A | Discharge status: Home / Self Care | Payer: BC Managed Care – PPO | Source: Ambulatory Visit | Attending: Emergency Medicine | Admitting: Emergency Medicine

## 2014-06-15 DIAGNOSIS — M79676 Pain in unspecified toe(s): Secondary | ICD-10-CM

## 2014-06-15 DIAGNOSIS — M7989 Other specified soft tissue disorders: Secondary | ICD-10-CM | POA: Insufficient documentation

## 2014-06-15 NOTE — Progress Notes (Signed)
*  PRELIMINARY RESULTS* Vascular Ultrasound Left lower extremity venous duplex has been completed.  Preliminary findings: no evidence of DVT or baker's cyst.   Landry Mellow, RDMS, RVT  06/15/2014, 8:09 AM

## 2014-07-09 ENCOUNTER — Other Ambulatory Visit (HOSPITAL_COMMUNITY): Payer: Self-pay | Admitting: Orthopedic Surgery

## 2014-07-13 MED ORDER — CEFAZOLIN SODIUM-DEXTROSE 2-3 GM-% IV SOLR
2.0000 g | INTRAVENOUS | Status: AC
  Administered 2014-07-14: 2 g via INTRAVENOUS

## 2014-07-13 NOTE — Progress Notes (Addendum)
I was unable to reach patient by phone.  I left  A message on voice mail.  I instructed the patient to arrive at [facility] entrance at [phone]   , nothing to eat or drink after midnight.   I instructed the patient to take the following medications in the am with just enough water to get them down:  Protonix and Clartin.  Use Advair inhaler, use Albuterol inhaler if needed and bring it to the hospital with you. I asked patient to not wear any lotions, powders, cologne, jewelry, piercing, make-up or nail polish.  I asked the patient to call [phone], in the am if there were any questions or problems.  I notified patient that he needs a driver to go home and someone to stay with him for the first 24 hours.

## 2014-07-14 ENCOUNTER — Encounter (HOSPITAL_COMMUNITY)
Admission: RE | Disposition: A | Discharge status: Home / Self Care | Payer: Self-pay | Source: Ambulatory Visit | Attending: Orthopedic Surgery

## 2014-07-14 ENCOUNTER — Ambulatory Visit (HOSPITAL_COMMUNITY): Payer: BC Managed Care – PPO | Admitting: Anesthesiology

## 2014-07-14 ENCOUNTER — Ambulatory Visit (HOSPITAL_COMMUNITY)
Admission: RE | Admit: 2014-07-14 | Discharge: 2014-07-14 | Disposition: A | Discharge status: Home / Self Care | Payer: BC Managed Care – PPO | Source: Ambulatory Visit | Attending: Orthopedic Surgery | Admitting: Orthopedic Surgery

## 2014-07-14 ENCOUNTER — Encounter (HOSPITAL_COMMUNITY): Payer: Self-pay | Admitting: *Deleted

## 2014-07-14 DIAGNOSIS — D1632 Benign neoplasm of short bones of left lower limb: Secondary | ICD-10-CM

## 2014-07-14 DIAGNOSIS — L97529 Non-pressure chronic ulcer of other part of left foot with unspecified severity: Secondary | ICD-10-CM | POA: Diagnosis not present

## 2014-07-14 DIAGNOSIS — K219 Gastro-esophageal reflux disease without esophagitis: Secondary | ICD-10-CM | POA: Insufficient documentation

## 2014-07-14 DIAGNOSIS — Z91013 Allergy to seafood: Secondary | ICD-10-CM | POA: Diagnosis not present

## 2014-07-14 DIAGNOSIS — I10 Essential (primary) hypertension: Secondary | ICD-10-CM | POA: Diagnosis not present

## 2014-07-14 DIAGNOSIS — M868X7 Other osteomyelitis, ankle and foot: Secondary | ICD-10-CM | POA: Insufficient documentation

## 2014-07-14 DIAGNOSIS — J45909 Unspecified asthma, uncomplicated: Secondary | ICD-10-CM | POA: Diagnosis not present

## 2014-07-14 DIAGNOSIS — E1140 Type 2 diabetes mellitus with diabetic neuropathy, unspecified: Secondary | ICD-10-CM | POA: Diagnosis not present

## 2014-07-14 DIAGNOSIS — M86172 Other acute osteomyelitis, left ankle and foot: Secondary | ICD-10-CM

## 2014-07-14 HISTORY — PX: AMPUTATION: SHX166

## 2014-07-14 LAB — PROTIME-INR
INR: 1.12 (ref 0.00–1.49)
Prothrombin Time: 14.6 seconds (ref 11.6–15.2)

## 2014-07-14 LAB — COMPREHENSIVE METABOLIC PANEL
ALBUMIN: 3.8 g/dL (ref 3.5–5.2)
ALT: 36 U/L (ref 0–53)
AST: 52 U/L — AB (ref 0–37)
Alkaline Phosphatase: 88 U/L (ref 39–117)
Anion gap: 12 (ref 5–15)
BILIRUBIN TOTAL: 0.3 mg/dL (ref 0.3–1.2)
BUN: 15 mg/dL (ref 6–23)
CHLORIDE: 101 meq/L (ref 96–112)
CO2: 27 mEq/L (ref 19–32)
CREATININE: 0.88 mg/dL (ref 0.50–1.35)
Calcium: 9.5 mg/dL (ref 8.4–10.5)
GFR calc Af Amer: >90 mL/min (ref >90)
GFR calc non Af Amer: >90 mL/min (ref >90)
Glucose, Bld: 88 mg/dL (ref 70–99)
POTASSIUM: 3.7 meq/L (ref 3.7–5.3)
Sodium: 140 mEq/L (ref 137–147)
TOTAL PROTEIN: 8.6 g/dL — AB (ref 6.0–8.3)

## 2014-07-14 LAB — CBC
HCT: 36.8 % — ABNORMAL LOW (ref 39.0–52.0)
Hemoglobin: 11.7 g/dL — ABNORMAL LOW (ref 13.0–17.0)
MCH: 26.3 pg (ref 26.0–34.0)
MCHC: 31.8 g/dL (ref 30.0–36.0)
MCV: 82.7 fL (ref 78.0–100.0)
PLATELETS: 328 10*3/uL (ref 150–400)
RBC: 4.45 MIL/uL (ref 4.22–5.81)
RDW: 12.9 % (ref 11.5–15.5)
WBC: 2.3 10*3/uL — ABNORMAL LOW (ref 4.0–10.5)

## 2014-07-14 LAB — APTT: APTT: 27 s (ref 24–37)

## 2014-07-14 SURGERY — AMPUTATION DIGIT
Anesthesia: General | Laterality: Left

## 2014-07-14 MED ORDER — FENTANYL CITRATE 0.05 MG/ML IJ SOLN
INTRAMUSCULAR | Status: DC | PRN
  Administered 2014-07-14: 100 ug via INTRAVENOUS

## 2014-07-14 MED ORDER — BUPIVACAINE-EPINEPHRINE (PF) 0.5% -1:200000 IJ SOLN
INTRAMUSCULAR | Status: DC | PRN
  Administered 2014-07-14: 30 mL via PERINEURAL

## 2014-07-14 MED ORDER — OXYCODONE HCL 5 MG/5ML PO SOLN: 5.0000 mg | Freq: Once | ORAL | Status: DC | PRN

## 2014-07-14 MED ORDER — PROPOFOL 10 MG/ML IV BOLUS
INTRAVENOUS | Status: AC
  Filled 2014-07-14: qty 20

## 2014-07-14 MED ORDER — LACTATED RINGERS IV SOLN
INTRAVENOUS | Status: DC | PRN
  Administered 2014-07-14: 10:00:00 via INTRAVENOUS

## 2014-07-14 MED ORDER — LIDOCAINE HCL (CARDIAC) 20 MG/ML IV SOLN
INTRAVENOUS | Status: AC
  Filled 2014-07-14: qty 5

## 2014-07-14 MED ORDER — PROPOFOL 10 MG/ML IV BOLUS
INTRAVENOUS | Status: DC | PRN
  Administered 2014-07-14: 100 mg via INTRAVENOUS

## 2014-07-14 MED ORDER — MIDAZOLAM HCL 5 MG/5ML IJ SOLN
INTRAMUSCULAR | Status: DC | PRN
  Administered 2014-07-14: 2 mg via INTRAVENOUS

## 2014-07-14 MED ORDER — ONDANSETRON HCL 4 MG/2ML IJ SOLN
INTRAMUSCULAR | Status: AC
  Filled 2014-07-14: qty 2

## 2014-07-14 MED ORDER — FENTANYL CITRATE 0.05 MG/ML IJ SOLN
INTRAMUSCULAR | Status: AC
  Administered 2014-07-14: 100 ug
  Filled 2014-07-14: qty 2

## 2014-07-14 MED ORDER — OXYCODONE HCL 5 MG PO TABS: 5.0000 mg | ORAL_TABLET | Freq: Once | ORAL | Status: DC | PRN

## 2014-07-14 MED ORDER — MIDAZOLAM HCL 2 MG/2ML IJ SOLN
INTRAMUSCULAR | Status: AC
  Administered 2014-07-14: 2 mg
  Filled 2014-07-14: qty 2

## 2014-07-14 MED ORDER — FENTANYL CITRATE 0.05 MG/ML IJ SOLN
INTRAMUSCULAR | Status: AC
  Filled 2014-07-14: qty 5

## 2014-07-14 MED ORDER — MIDAZOLAM HCL 2 MG/2ML IJ SOLN
INTRAMUSCULAR | Status: AC
  Filled 2014-07-14: qty 2

## 2014-07-14 MED ORDER — ONDANSETRON HCL 4 MG/2ML IJ SOLN
INTRAMUSCULAR | Status: DC | PRN
  Administered 2014-07-14: 4 mg via INTRAVENOUS

## 2014-07-14 MED ORDER — HYDROMORPHONE HCL 1 MG/ML IJ SOLN: 0.2500 mg | INTRAMUSCULAR | Status: DC | PRN

## 2014-07-14 MED ORDER — PROMETHAZINE HCL 25 MG/ML IJ SOLN: 6.2500 mg | INTRAMUSCULAR | Status: DC | PRN

## 2014-07-14 MED ORDER — LACTATED RINGERS IV SOLN
INTRAVENOUS | Status: DC
  Administered 2014-07-14: 09:00:00 via INTRAVENOUS

## 2014-07-14 MED ORDER — OXYCODONE-ACETAMINOPHEN 5-325 MG PO TABS: 1.0000 | ORAL_TABLET | ORAL | Status: DC | PRN

## 2014-07-14 MED ORDER — 0.9 % SODIUM CHLORIDE (POUR BTL) OPTIME
TOPICAL | Status: DC | PRN
  Administered 2014-07-14: 1000 mL

## 2014-07-14 SURGICAL SUPPLY — 29 items
BNDG COHESIVE 4X5 TAN STRL (GAUZE/BANDAGES/DRESSINGS) ×2 IMPLANT
BNDG ESMARK 4X9 LF (GAUZE/BANDAGES/DRESSINGS) IMPLANT
BNDG GAUZE ELAST 4 BULKY (GAUZE/BANDAGES/DRESSINGS) ×2 IMPLANT
COVER SURGICAL LIGHT HANDLE (MISCELLANEOUS) ×2 IMPLANT
DRAPE U-SHAPE 47X51 STRL (DRAPES) ×2 IMPLANT
DRSG ADAPTIC 3X8 NADH LF (GAUZE/BANDAGES/DRESSINGS) ×2 IMPLANT
DRSG PAD ABDOMINAL 8X10 ST (GAUZE/BANDAGES/DRESSINGS) ×2 IMPLANT
DURAPREP 26ML APPLICATOR (WOUND CARE) ×2 IMPLANT
ELECT REM PT RETURN 9FT ADLT (ELECTROSURGICAL) ×2
ELECTRODE REM PT RTRN 9FT ADLT (ELECTROSURGICAL) ×1 IMPLANT
GAUZE SPONGE 4X4 12PLY STRL (GAUZE/BANDAGES/DRESSINGS) IMPLANT
GLOVE BIOGEL PI IND STRL 9 (GLOVE) ×1 IMPLANT
GLOVE BIOGEL PI INDICATOR 9 (GLOVE) ×1
GLOVE SURG ORTHO 9.0 STRL STRW (GLOVE) ×2 IMPLANT
GOWN STRL REUS W/ TWL XL LVL3 (GOWN DISPOSABLE) ×2 IMPLANT
GOWN STRL REUS W/TWL XL LVL3 (GOWN DISPOSABLE) ×2
KIT BASIN OR (CUSTOM PROCEDURE TRAY) ×2 IMPLANT
KIT ROOM TURNOVER OR (KITS) ×2 IMPLANT
MANIFOLD NEPTUNE II (INSTRUMENTS) ×2 IMPLANT
NEEDLE 22X1 1/2 (OR ONLY) (NEEDLE) IMPLANT
NS IRRIG 1000ML POUR BTL (IV SOLUTION) ×2 IMPLANT
PACK ORTHO EXTREMITY (CUSTOM PROCEDURE TRAY) ×2 IMPLANT
PAD ARMBOARD 7.5X6 YLW CONV (MISCELLANEOUS) ×4 IMPLANT
SPONGE GAUZE 4X4 12PLY STER LF (GAUZE/BANDAGES/DRESSINGS) ×2 IMPLANT
SUCTION FRAZIER TIP 10 FR DISP (SUCTIONS) IMPLANT
SUT ETHILON 2 0 PSLX (SUTURE) ×2 IMPLANT
SYR CONTROL 10ML LL (SYRINGE) IMPLANT
TOWEL OR 17X24 6PK STRL BLUE (TOWEL DISPOSABLE) ×2 IMPLANT
TOWEL OR 17X26 10 PK STRL BLUE (TOWEL DISPOSABLE) ×2 IMPLANT

## 2014-07-14 NOTE — Progress Notes (Addendum)
Orthopedic Tech Progress Note Patient Details:  Stephen Whitaker 12/17/1955 [id number]  Ortho Devices Type of Ortho Device: Crutches Ortho Device/Splint Location: lle Ortho Device/Splint Interventions: Application Viewed order from doctor's order list One ortho visit is to be deleted [name] 07/14/2014, 2:54 PM

## 2014-07-14 NOTE — Transfer of Care (Signed)
Immediate Anesthesia Transfer of Care Note  Patient: Stephen Whitaker  Procedure(s) Performed: Procedure(s): 2nd Toe Amputation (Left)  Patient Location: PACU  Anesthesia Type:MAC  Level of Consciousness: awake, alert , oriented and patient cooperative  Airway & Oxygen Therapy: Patient Spontanous Breathing  Post-op Assessment: Report given to PACU RN, Post -op Vital signs reviewed and stable and Patient moving all extremities  Post vital signs: Reviewed and stable  Complications: No apparent anesthesia complications

## 2014-07-14 NOTE — H&P (Signed)
Stephen Whitaker is an 58 y.o. male.   Chief Complaint: Left foot second toe osteomyelitis and ulceration HPI: Patient is a 58 year old gentleman with diabetic insensate neuropathy who presents with ulceration osteomyelitis left foot second toe  Past Medical History  Diagnosis Date  . Hypertension     No past surgical history on file.  No family history on file. Social History:  reports that he has never smoked. He does not have any smokeless tobacco history on file. He reports that he drinks alcohol. His drug history is not on file.  Allergies:  Allergies  Allergen Reactions  . Shellfish Allergy Swelling    No prescriptions prior to admission    No results found for this or any previous visit (from the past 48 hour(s)). No results found.  Review of Systems  All other systems reviewed and are negative.   There were no vitals taken for this visit. Physical Exam  On examination patient has diminished pulses. There is ulceration osteomyelitis left foot second toe. Assessment/Plan Assessment: Ulceration osteomyelitis left foot second toe.  Plan: We'll plan for left foot second toe amputation. Risks and benefits were discussed including risk of the wound not healing risk for higher level amputation. Patient states he understands and wished to proceed at this time.  Stephen Whitaker 07/14/2014, 6:57 AM

## 2014-07-14 NOTE — Discharge Instructions (Signed)
What to eat:  For your first meals, you should eat lightly; only small meals initially.  If you do not have nausea, you may eat larger meals.  Avoid spicy, greasy and heavy food.    General Anesthesia, Adult, Care After  Refer to this sheet in the next few weeks. These instructions provide you with information on caring for yourself after your procedure. Your health care provider may also give you more specific instructions. Your treatment has been planned according to current medical practices, but problems sometimes occur. Call your health care provider if you have any problems or questions after your procedure.  WHAT TO EXPECT AFTER THE PROCEDURE  After the procedure, it is typical to experience:  Sleepiness.  Nausea and vomiting. HOME CARE INSTRUCTIONS  For the first 24 hours after general anesthesia:  Have a responsible person with you.  Do not drive a car. If you are alone, do not take public transportation.  Do not drink alcohol.  Do not take medicine that has not been prescribed by your health care provider.  Do not sign important papers or make important decisions.  You may resume a normal diet and activities as directed by your health care provider.  Change bandages (dressings) as directed.  If you have questions or problems that seem related to general anesthesia, call the hospital and ask for the anesthetist or anesthesiologist on call. SEEK MEDICAL CARE IF:  You have nausea and vomiting that continue the day after anesthesia.  You develop a rash. SEEK IMMEDIATE MEDICAL CARE IF:  You have difficulty breathing.  You have chest pain.  You have any allergic problems. Document Released: 11/12/2000 Document Revised: 04/08/2013 Document Reviewed: 02/19/2013  [facility] Patient Information 2014 [employer] [place], [place].   Reinforce dressing if it bleeds through, and call Dr. Sharol Given as needed.

## 2014-07-14 NOTE — Progress Notes (Signed)
Orthopedic Tech Progress Note Patient Details:  Stephen Whitaker 21-Sep-1955 [id number] There was a double charge for an ortho tech visit, so one had to be deleted. Patient ID: Stephen Whitaker, male   DOB: January 24, 1956, 58 y.o.   MRN: [id number]   Stephen Whitaker 07/14/2014, 4:11 PM

## 2014-07-14 NOTE — Anesthesia Preprocedure Evaluation (Addendum)
Anesthesia Evaluation  Patient identified by MRN, date of birth, ID band Patient awake    Reviewed: Allergy & Precautions, H&P , NPO status   History of Anesthesia Complications Negative for: history of anesthetic complications  Airway Mallampati: II  TM Distance: >3 FB Neck ROM: Full    Dental  (+) Teeth Intact, Dental Advisory Given   Pulmonary asthma ,    Pulmonary exam normal       Cardiovascular hypertension,     Neuro/Psych negative neurological ROS  negative psych ROS   GI/Hepatic Neg liver ROS, GERD-  Medicated,  Endo/Other  negative endocrine ROS  Renal/GU negative Renal ROS     Musculoskeletal   Abdominal   Peds  Hematology   Anesthesia Other Findings   Reproductive/Obstetrics                            Anesthesia Physical Anesthesia Plan  ASA: III  Anesthesia Plan: MAC and Regional   Post-op Pain Management:    Induction: Intravenous  Airway Management Planned: Simple Face Mask  Additional Equipment:   Intra-op Plan:   Post-operative Plan:   Informed Consent: I have reviewed the patients History and Physical, chart, labs and discussed the procedure including the risks, benefits and alternatives for the proposed anesthesia with the patient or authorized representative who has indicated his/her understanding and acceptance.   Dental advisory given  Plan Discussed with: Anesthesiologist, Surgeon and CRNA  Anesthesia Plan Comments:        Anesthesia Quick Evaluation

## 2014-07-14 NOTE — Progress Notes (Signed)
Orthopedic Tech Progress Note Patient Details:  Stephen Whitaker [birth year] [id number]  Ortho Devices Type of Ortho Device: Postop shoe/boot Ortho Device/Splint Location: lle Ortho Device/Splint Interventions: Application   [name] 07/14/2014, 12:32 PM

## 2014-07-14 NOTE — Progress Notes (Signed)
Orthopedic Tech Progress Note Patient Details:  Stephen Whitaker August 07, 1956 [id number]  Patient ID: Stephen Whitaker, male   DOB: May 30, 1956, 58 y.o.   MRN: [id number]  Viewed order from doctor's order list [name] 07/14/2014, 12:33 PM

## 2014-07-14 NOTE — Anesthesia Procedure Notes (Addendum)
Anesthesia Regional Block:  Popliteal block  Pre-Anesthetic Checklist: ,, timeout performed, Correct Patient, Correct Site, Correct Laterality, Correct Procedure, Correct Position, site marked, Risks and benefits discussed,  Surgical consent,  Pre-op evaluation,  At surgeon's request and post-op pain management  Laterality: Left  Prep: chloraprep       Needles:  Injection technique: Single-shot  Needle Type: Echogenic Stimulator Needle          Additional Needles:  Procedures: ultrasound guided (picture in chart) and nerve stimulator Popliteal block  Nerve Stimulator or Paresthesia:  Response: plantar flexion, 0.45 mA,   Additional Responses:   Narrative:  Start time: 07/14/2014 10:09 AM End time: 07/14/2014 10:19 AM  Performed by: Personally   Additional Notes: A functioning IV was confirmed and monitors were applied.  Sterile prep and drape, hand hygiene and sterile gloves were used.  Negative aspiration and test dose prior to incremental administration of local anesthetic. The patient tolerated the procedure well.Ultrasound  guidance: relevant anatomy identified, needle position confirmed, local anesthetic spread visualized around nerve(s), vascular puncture avoided.  Image printed for medical record.    Procedure Name: MAC Date/Time: 07/14/2014 11:03 AM Performed by: Izora Gala Pre-anesthesia Checklist: Patient identified, Emergency Drugs available, Suction available and Patient being monitored Patient Re-evaluated:Patient Re-evaluated prior to inductionPreoxygenation: Pre-oxygenation with 100% oxygen Intubation Type: IV induction Placement Confirmation: positive ETCO2

## 2014-07-14 NOTE — Anesthesia Postprocedure Evaluation (Signed)
Anesthesia Post Note  Patient: Stephen Whitaker  Procedure(s) Performed: Procedure(s) (LRB): 2nd Toe Amputation (Left)  Anesthesia type: MAC  Patient location: PACU  Post pain: Pain level controlled  Post assessment: Patient's Cardiovascular Status Stable  Last Vitals:  Filed Vitals:   07/14/14 1232  BP:   Pulse: 69  Temp:   Resp:     Post vital signs: Reviewed and stable  Level of consciousness: sedated  Complications: No apparent anesthesia complications

## 2014-07-14 NOTE — Op Note (Signed)
07/14/2014  11:13 AM  PATIENT:  Stephen Whitaker    PRE-OPERATIVE DIAGNOSIS:  Osteomyelitis Left 2nd Toe  POST-OPERATIVE DIAGNOSIS:  Same  PROCEDURE:  2nd Ray Amputation  SURGEON:  [name], MD  PHYSICIAN ASSISTANT:None ANESTHESIA:   General  PREOPERATIVE INDICATIONS:  Stephen Whitaker Marrow is a  58 y.o. male with a diagnosis of Osteomyelitis Left 2nd Toe who failed conservative measures and elected for surgical management.    The risks benefits and alternatives were discussed with the patient preoperatively including but not limited to the risks of infection, bleeding, nerve injury, cardiopulmonary complications, the need for revision surgery, among others, and the patient was willing to proceed.  OPERATIVE IMPLANTS: None  OPERATIVE FINDINGS: Good petechial bleeding  OPERATIVE PROCEDURE: Patient was brought to the operating room after undergoing an ankle block. After adequate levels of anesthesia were obtained patient's left lower extremity was prepped using DuraPrep draped into a sterile field. A timeout was called.  An elliptical incision was made around the second toe and second ray. Metatarsal was transected through the shaft and the metatarsal head and toe were resected in one block of tissue. The wound was irrigated with normal saline hemostasis was obtained. Sterile compressive dressing was applied patient was taken to the PACU in stable condition.

## 2014-07-19 ENCOUNTER — Encounter (HOSPITAL_COMMUNITY): Payer: Self-pay | Admitting: Orthopedic Surgery

## 2014-08-10 ENCOUNTER — Encounter: Payer: Self-pay | Admitting: Internal Medicine

## 2014-08-10 ENCOUNTER — Ambulatory Visit: Payer: BC Managed Care – PPO | Attending: Internal Medicine | Admitting: Internal Medicine

## 2014-08-10 VITALS — BP 155/84 | HR 88 | Temp 99.5°F | Resp 16 | Ht 74.5 in | Wt 193.0 lb

## 2014-08-10 DIAGNOSIS — M1000 Idiopathic gout, unspecified site: Secondary | ICD-10-CM | POA: Diagnosis not present

## 2014-08-10 DIAGNOSIS — Z0000 Encounter for general adult medical examination without abnormal findings: Secondary | ICD-10-CM

## 2014-08-10 DIAGNOSIS — K861 Other chronic pancreatitis: Secondary | ICD-10-CM | POA: Diagnosis not present

## 2014-08-10 DIAGNOSIS — H109 Unspecified conjunctivitis: Secondary | ICD-10-CM | POA: Insufficient documentation

## 2014-08-10 DIAGNOSIS — J42 Unspecified chronic bronchitis: Secondary | ICD-10-CM | POA: Insufficient documentation

## 2014-08-10 DIAGNOSIS — I10 Essential (primary) hypertension: Secondary | ICD-10-CM | POA: Diagnosis not present

## 2014-08-10 DIAGNOSIS — J322 Chronic ethmoidal sinusitis: Secondary | ICD-10-CM | POA: Insufficient documentation

## 2014-08-10 DIAGNOSIS — Z23 Encounter for immunization: Secondary | ICD-10-CM

## 2014-08-10 DIAGNOSIS — J449 Chronic obstructive pulmonary disease, unspecified: Secondary | ICD-10-CM | POA: Insufficient documentation

## 2014-08-10 DIAGNOSIS — Z1211 Encounter for screening for malignant neoplasm of colon: Secondary | ICD-10-CM

## 2014-08-10 DIAGNOSIS — H1013 Acute atopic conjunctivitis, bilateral: Secondary | ICD-10-CM | POA: Insufficient documentation

## 2014-08-10 LAB — TSH: TSH: 3.603 u[IU]/mL (ref 0.350–4.500)

## 2014-08-10 LAB — LIPID PANEL
CHOL/HDL RATIO: 3.3 ratio
Cholesterol: 143 mg/dL (ref 0–200)
HDL: 44 mg/dL (ref >39)
LDL Cholesterol: 84 mg/dL (ref 0–99)
Triglycerides: 77 mg/dL (ref <150)
VLDL: 15 mg/dL (ref 0–40)

## 2014-08-10 LAB — COMPLETE METABOLIC PANEL WITH GFR
ALBUMIN: 4.2 g/dL (ref 3.5–5.2)
ALT: 35 U/L (ref 0–53)
AST: 41 U/L — ABNORMAL HIGH (ref 0–37)
Alkaline Phosphatase: 89 U/L (ref 39–117)
BILIRUBIN TOTAL: 0.4 mg/dL (ref 0.2–1.2)
BUN: 15 mg/dL (ref 6–23)
CO2: 31 meq/L (ref 19–32)
CREATININE: 0.98 mg/dL (ref 0.50–1.35)
Calcium: 9.8 mg/dL (ref 8.4–10.5)
Chloride: 98 mEq/L (ref 96–112)
GFR, Est African American: >89 mL/min
GFR, Est Non African American: 84 mL/min
Glucose, Bld: 90 mg/dL (ref 70–99)
Potassium: 3.8 mEq/L (ref 3.5–5.3)
SODIUM: 138 meq/L (ref 135–145)
TOTAL PROTEIN: 8.3 g/dL (ref 6.0–8.3)

## 2014-08-10 LAB — POCT GLYCOSYLATED HEMOGLOBIN (HGB A1C): Hemoglobin A1C: 5.6

## 2014-08-10 LAB — URIC ACID: Uric Acid, Serum: 6 mg/dL (ref 4.0–7.8)

## 2014-08-10 MED ORDER — FLUTICASONE PROPIONATE 50 MCG/ACT NA SUSP: 1.0000 | Freq: Every day | NASAL | Status: DC

## 2014-08-10 MED ORDER — ERYTHROMYCIN 5 MG/GM OP OINT: 1.0000 "application " | TOPICAL_OINTMENT | Freq: Every day | OPHTHALMIC | Status: DC

## 2014-08-10 MED ORDER — IBUPROFEN 800 MG PO TABS: 800.0000 mg | ORAL_TABLET | Freq: Three times a day (TID) | ORAL | Status: DC | PRN

## 2014-08-10 MED ORDER — HYDROCHLOROTHIAZIDE 25 MG PO TABS: 25.0000 mg | ORAL_TABLET | Freq: Every day | ORAL | Status: DC

## 2014-08-10 MED ORDER — PANTOPRAZOLE SODIUM 40 MG PO TBEC: 40.0000 mg | DELAYED_RELEASE_TABLET | Freq: Every day | ORAL | Status: DC

## 2014-08-10 MED ORDER — PANCRELIPASE (LIP-PROT-AMYL) 24000-76000 UNITS PO CPEP: 24000.0000 [IU] | ORAL_CAPSULE | Freq: Every day | ORAL | Status: DC

## 2014-08-10 NOTE — Progress Notes (Signed)
Patient ID: Stephen Whitaker, male   DOB: Mar 21, 1956, 58 y.o.   MRN: [id number]   Stephen Whitaker, is a 58 y.o. male  NKN:[id number]  FXT:[id number]  DOB - 1956-06-18  CC:  Chief Complaint  Patient presents with  . Establish Care       HPI: Stephen Whitaker is a 58 y.o. male here today to establish medical care. Patient has history of hypertension , chronic pancreatitis and diabetes mellitus who recently had amputation of left second toe due to osteomyelitis came in today for follow-up. Patient needs med refill and also some paperwork filled out.  He is complaining of redness and discharge of both eyes for a few days , no blurry vision , no foreign body, no history of trauma to the eyes. Patient has numbness and tingling in his lower extremities for years. He denies any recent trauma. Patient has No headache, No chest pain, No abdominal pain - No Nausea, No new weakness tingling or numbness, No Cough - SOB.  Allergies  Allergen Reactions  . Shellfish Allergy Swelling   Past Medical History  Diagnosis Date  . Hypertension   . Pancreatitis, acute    Current Outpatient Prescriptions on File Prior to Visit  Medication Sig Dispense Refill  . albuterol (PROVENTIL HFA;VENTOLIN HFA) 108 (90 BASE) MCG/ACT inhaler Inhale 2 puffs into the lungs every 6 (six) hours as needed for wheezing or shortness of breath.    Marland Kitchen Bioflavonoid Products (ESTER C PO) Take 1 tablet by mouth daily.    . Fluticasone-Salmeterol (ADVAIR) 500-50 MCG/DOSE AEPB Inhale 1 puff into the lungs 2 (two) times daily.    Marland Kitchen loratadine (CLARITIN) 10 MG tablet Take 10 mg by mouth daily.    . Multiple Vitamin (MULTIVITAMIN WITH MINERALS) TABS tablet Take 1 tablet by mouth daily.    Marland Kitchen OVER THE COUNTER MEDICATION Take 1 capsule by mouth 3 (three) times daily with meals. Blue/green algae    . oxyCODONE-acetaminophen (ROXICET) 5-325 MG per tablet Take 1 tablet by mouth every 4 (four) hours as needed for severe pain. 60 tablet 0  .  Digestive Enzymes (DIGESTIVE ENZYME PO) Take 1 capsule by mouth 3 (three) times daily with meals. [employer] brand     No current facility-administered medications on file prior to visit.   Family History  Problem Relation Age of Onset  . Adopted: Yes   History   Social History  . Marital Status: Single    Spouse Name: N/A    Number of Children: N/A  . Years of Education: N/A   Occupational History  . Not on file.   Social History Main Topics  . Smoking status: Never Smoker   . Smokeless tobacco: Not on file  . Alcohol Use: Yes  . Drug Use: Not on file  . Sexual Activity: Not on file   Other Topics Concern  . Not on file   Social History Narrative    Review of Systems: Constitutional: Negative for fever, chills, diaphoresis, activity change, appetite change and fatigue. HENT: Negative for ear pain, nosebleeds, congestion, facial swelling, rhinorrhea, neck pain, neck stiffness and ear discharge.  Eyes: Negative for pain, discharge, redness, itching and visual disturbance. Respiratory: Negative for cough, choking, chest tightness, shortness of breath, wheezing and stridor.  Cardiovascular: Negative for chest pain, palpitations and leg swelling. Gastrointestinal: Negative for abdominal distention. Genitourinary: Negative for dysuria, urgency, frequency, hematuria, flank pain, decreased urine volume, difficulty urinating and dyspareunia.  Musculoskeletal: Negative for back pain, joint swelling, arthralgia and gait  problem. Neurological: Negative for dizziness, tremors, seizures, syncope, facial asymmetry, speech difficulty, weakness, light-headedness, numbness and headaches.  Hematological: Negative for adenopathy. Does not bruise/bleed easily. Psychiatric/Behavioral: Negative for hallucinations, behavioral problems, confusion, dysphoric mood, decreased concentration and agitation.    Objective:   Filed Vitals:   08/10/14 0930  BP: 155/84  Pulse: 88  Temp: 99.5 F (37.5 C)    Resp: 16    Physical Exam: Constitutional: Patient appears well-developed and well-nourished. No distress. HENT: Normocephalic, atraumatic, External right and left ear normal. Oropharynx is clear and moist.  Eyes:  Bilateral conjunctivitis with conjunctival injections. PERRLA, no scleral icterus. Neck: Normal ROM. Neck supple. No JVD. No tracheal deviation. No thyromegaly. CVS: RRR, S1/S2 +, no murmurs, no gallops, no carotid bruit.  Pulmonary: Effort and breath sounds normal, no stridor, rhonchi, wheezes, rales.  Abdominal: Soft. BS +, no distension, tenderness, rebound or guarding.  Musculoskeletal: Normal range of motion.  S/P 2nd toe of left foot amputation Lymphadenopathy: No lymphadenopathy noted, cervical, inguinal or axillary Neuro: Alert. Normal reflexes, muscle tone coordination. No cranial nerve deficit. Skin: Skin is warm and dry. No rash noted. Not diaphoretic. No erythema. No pallor. Psychiatric: Normal mood and affect. Behavior, judgment, thought content normal.  Lab Results  Component Value Date   WBC 2.3* 07/14/2014   HGB 11.7* 07/14/2014   HCT 36.8* 07/14/2014   MCV 82.7 07/14/2014   PLT 328 07/14/2014   Lab Results  Component Value Date   CREATININE 0.88 07/14/2014   BUN 15 07/14/2014   NA 140 07/14/2014   K 3.7 07/14/2014   CL 101 07/14/2014   CO2 27 07/14/2014    No results found for: HGBA1C Lipid Panel     Component Value Date/Time   CHOL 146 02/15/2010 2227   TRIG 69 02/15/2010 2227   HDL 40 02/15/2010 2227   CHOLHDL 3.7 Ratio 02/15/2010 2227   VLDL 14 02/15/2010 2227   LDLCALC 92 02/15/2010 2227       Assessment and plan:   1. Bilateral conjunctivitis  - erythromycin (ROMYCIN) ophthalmic ointment; Place 1 application into both eyes at bedtime.  Dispense: 3.5 g; Refill: 1  2. Other chronic pancreatitis  - Pancrelipase, Lip-Prot-Amyl, (CREON) 24000 UNITS CPEP; Take 1 capsule (24,000 Units total) by mouth daily.  Dispense: 180 capsule;  Refill: 3 - pantoprazole (PROTONIX) 40 MG tablet; Take 1 tablet (40 mg total) by mouth daily.  Dispense: 30 tablet; Refill: 3  3. Chronic bronchitis, unspecified chronic bronchitis type Continue inhalers  4. Chronic ethmoidal sinusitis  - fluticasone (FLONASE) 50 MCG/ACT nasal spray; Place 1 spray into both nostrils daily.  Dispense: 16 g; Refill: 1  5. Essential hypertension  - hydrochlorothiazide (HYDRODIURIL) 25 MG tablet; Take 1 tablet (25 mg total) by mouth daily.  Dispense: 30 tablet; Refill: 3  - CBC with Differential - COMPLETE METABOLIC PANEL WITH GFR - POCT glycosylated hemoglobin (Hb A1C) - Lipid panel - TSH - Urinalysis, Complete - PSA  6. Idiopathic gout, unspecified chronicity, unspecified site  - ibuprofen (ADVIL,MOTRIN) 800 MG tablet; Take 1 tablet (800 mg total) by mouth every 8 (eight) hours as needed for mild pain.  Dispense: 60 tablet; Refill: 1 - Uric Acid  7. Colon cancer screening  - [facility] COLONOSCOPY - Ambulatory referral to Gastroenterology  Patient was counseled extensively about nutrition and exercise.  Return in about 3 months (around 11/09/2014), or if symptoms worsen or fail to improve, for Follow up HTN, Gout, Routine Follow Up.  The patient was  given clear instructions to go to ER or return to medical center if symptoms don't improve, worsen or new problems develop. The patient verbalized understanding. The patient was told to call to get lab results if they haven't heard anything in the next week.     This note has been created with Surveyor, quantity. Any transcriptional errors are unintentional.    Angelica Chessman, MD, [place], [place], [place], [place]   08/10/2014, 10:22 AM

## 2014-08-10 NOTE — Patient Instructions (Signed)
Gout Gout is an inflammatory arthritis caused by a buildup of uric acid crystals in the joints. Uric acid is a chemical that is normally present in the blood. When the level of uric acid in the blood is too high it can form crystals that deposit in your joints and tissues. This causes joint redness, soreness, and swelling (inflammation). Repeat attacks are common. Over time, uric acid crystals can form into masses (tophi) near a joint, destroying bone and causing disfigurement. Gout is treatable and often preventable. CAUSES  The disease begins with elevated levels of uric acid in the blood. Uric acid is produced by your body when it breaks down a naturally found substance called purines. Certain foods you eat, such as meats and fish, contain high amounts of purines. Causes of an elevated uric acid level include:  Being passed down from parent to child (heredity).  Diseases that cause increased uric acid production (such as obesity, psoriasis, and certain cancers).  Excessive alcohol use.  Diet, especially diets rich in meat and seafood.  Medicines, including certain cancer-fighting medicines (chemotherapy), water pills (diuretics), and aspirin.  Chronic kidney disease. The kidneys are no longer able to remove uric acid well.  Problems with metabolism. Conditions strongly associated with gout include:  Obesity.  High blood pressure.  High cholesterol.  Diabetes. Not everyone with elevated uric acid levels gets gout. It is not understood why some people get gout and others do not. Surgery, joint injury, and eating too much of certain foods are some of the factors that can lead to gout attacks. SYMPTOMS   An attack of gout comes on quickly. It causes intense pain with redness, swelling, and warmth in a joint.  Fever can occur.  Often, only one joint is involved. Certain joints are more commonly involved:  Base of the big toe.  Knee.  Ankle.  Wrist.  Finger. Without  treatment, an attack usually goes away in a few days to weeks. Between attacks, you usually will not have symptoms, which is different from many other forms of arthritis. DIAGNOSIS  Your caregiver will suspect gout based on your symptoms and exam. In some cases, tests may be recommended. The tests may include:  Blood tests.  Urine tests.  X-rays.  Joint fluid exam. This exam requires a needle to remove fluid from the joint (arthrocentesis). Using a microscope, gout is confirmed when uric acid crystals are seen in the joint fluid. TREATMENT  There are two phases to gout treatment: treating the sudden onset (acute) attack and preventing attacks (prophylaxis).  Treatment of an Acute Attack.  Medicines are used. These include anti-inflammatory medicines or steroid medicines.  An injection of steroid medicine into the affected joint is sometimes necessary.  The painful joint is rested. Movement can worsen the arthritis.  You may use warm or cold treatments on painful joints, depending which works best for you.  Treatment to Prevent Attacks.  If you suffer from frequent gout attacks, your caregiver may advise preventive medicine. These medicines are started after the acute attack subsides. These medicines either help your kidneys eliminate uric acid from your body or decrease your uric acid production. You may need to stay on these medicines for a very long time.  The early phase of treatment with preventive medicine can be associated with an increase in acute gout attacks. For this reason, during the first few months of treatment, your caregiver may also advise you to take medicines usually used for acute gout treatment. Be sure you   understand your caregiver's directions. Your caregiver may make several adjustments to your medicine dose before these medicines are effective.  Discuss dietary treatment with your caregiver or dietitian. Alcohol and drinks high in sugar and fructose and foods  such as meat, poultry, and seafood can increase uric acid levels. Your caregiver or dietitian can advise you on drinks and foods that should be limited. HOME CARE INSTRUCTIONS   Do not take aspirin to relieve pain. This raises uric acid levels.  Only take over-the-counter or prescription medicines for pain, discomfort, or fever as directed by your caregiver.  Rest the joint as much as possible. When in bed, keep sheets and blankets off painful areas.  Keep the affected joint raised (elevated).  Apply warm or cold treatments to painful joints. Use of warm or cold treatments depends on which works best for you.  Use crutches if the painful joint is in your leg.  Drink enough fluids to keep your urine clear or pale yellow. This helps your body get rid of uric acid. Limit alcohol, sugary drinks, and fructose drinks.  Follow your dietary instructions. Pay careful attention to the amount of protein you eat. Your daily diet should emphasize fruits, vegetables, whole grains, and fat-free or low-fat milk products. Discuss the use of coffee, vitamin C, and cherries with your caregiver or dietitian. These may be helpful in lowering uric acid levels.  Maintain a healthy body weight. SEEK MEDICAL CARE IF:   You develop diarrhea, vomiting, or any side effects from medicines.  You do not feel better in 24 hours, or you are getting worse. SEEK IMMEDIATE MEDICAL CARE IF:   Your joint becomes suddenly more tender, and you have chills or a fever. MAKE SURE YOU:   Understand these instructions.  Will watch your condition.  Will get help right away if you are not doing well or get worse. Document Released: 08/03/2000 Document Revised: 12/21/2013 Document Reviewed: 03/19/2012 [facility] Patient Information 2015 [place], [place]. This information is not intended to replace advice given to you by your health care provider. Make sure you discuss any questions you have with your health care provider. DASH  Eating Plan DASH stands for "Dietary Approaches to Stop Hypertension." The DASH eating plan is a healthy eating plan that has been shown to reduce high blood pressure (hypertension). Additional health benefits may include reducing the risk of type 2 diabetes mellitus, heart disease, and stroke. The DASH eating plan may also help with weight loss. WHAT DO I NEED TO KNOW ABOUT THE DASH EATING PLAN? For the DASH eating plan, you will follow these general guidelines:  Choose foods with a percent daily value for sodium of less than 5% (as listed on the food label).  Use salt-free seasonings or herbs instead of table salt or sea salt.  Check with your health care provider or pharmacist before using salt substitutes.  Eat lower-sodium products, often labeled as "lower sodium" or "no salt added."  Eat fresh foods.  Eat more vegetables, fruits, and low-fat dairy products.  Choose whole grains. Look for the word "whole" as the first word in the ingredient list.  Choose fish and skinless chicken or [place] more often than red meat. Limit fish, poultry, and meat to 6 oz (170 g) each day.  Limit sweets, desserts, sugars, and sugary drinks.  Choose heart-healthy fats.  Limit cheese to 1 oz (28 g) per day.  Eat more home-cooked food and less restaurant, buffet, and fast food.  Limit fried foods.  Lacinda Axon  foods using methods other than frying.  Limit canned vegetables. If you do use them, rinse them well to decrease the sodium.  When eating at a restaurant, ask that your food be prepared with less salt, or no salt if possible. WHAT FOODS CAN I EAT? Seek help from a dietitian for individual calorie needs. Grains Whole grain or whole wheat bread. Brown rice. Whole grain or whole wheat pasta. Quinoa, bulgur, and whole grain cereals. Low-sodium cereals. Corn or whole wheat flour tortillas. Whole grain cornbread. Whole grain crackers. Low-sodium crackers. Vegetables Fresh or frozen vegetables (raw,  steamed, roasted, or grilled). Low-sodium or reduced-sodium tomato and vegetable juices. Low-sodium or reduced-sodium tomato sauce and paste. Low-sodium or reduced-sodium canned vegetables.  Fruits All fresh, canned (in natural juice), or frozen fruits. Meat and Other Protein Products Ground beef (85% or leaner), grass-fed beef, or beef trimmed of fat. Skinless chicken or [place]. Ground chicken or [place]. Pork trimmed of fat. All fish and seafood. Eggs. Dried beans, peas, or lentils. Unsalted nuts and seeds. Unsalted canned beans. Dairy Low-fat dairy products, such as skim or 1% milk, 2% or reduced-fat cheeses, low-fat ricotta or cottage cheese, or plain low-fat yogurt. Low-sodium or reduced-sodium cheeses. Fats and Oils Tub margarines without trans fats. Light or reduced-fat mayonnaise and salad dressings (reduced sodium). Avocado. Safflower, olive, or canola oils. Natural peanut or almond butter. Other Unsalted popcorn and pretzels. The items listed above may not be a complete list of recommended foods or beverages. Contact your dietitian for more options. WHAT FOODS ARE NOT RECOMMENDED? Grains White bread. White pasta. White rice. Refined cornbread. Bagels and croissants. Crackers that contain trans fat. Vegetables Creamed or fried vegetables. Vegetables in a cheese sauce. Regular canned vegetables. Regular canned tomato sauce and paste. Regular tomato and vegetable juices. Fruits Dried fruits. Canned fruit in light or heavy syrup. Fruit juice. Meat and Other Protein Products Fatty cuts of meat. Ribs, chicken wings, bacon, sausage, bologna, salami, chitterlings, fatback, hot dogs, bratwurst, and packaged luncheon meats. Salted nuts and seeds. Canned beans with salt. Dairy Whole or 2% milk, cream, half-and-half, and cream cheese. Whole-fat or sweetened yogurt. Full-fat cheeses or blue cheese. Nondairy creamers and whipped toppings. Processed cheese, cheese spreads, or cheese  curds. Condiments Onion and garlic salt, seasoned salt, table salt, and sea salt. Canned and packaged gravies. Worcestershire sauce. Tartar sauce. Barbecue sauce. Teriyaki sauce. Soy sauce, including reduced sodium. Steak sauce. Fish sauce. Oyster sauce. Cocktail sauce. Horseradish. Ketchup and mustard. Meat flavorings and tenderizers. Bouillon cubes. Hot sauce. Tabasco sauce. Marinades. Taco seasonings. Relishes. Fats and Oils Butter, stick margarine, lard, shortening, ghee, and bacon fat. Coconut, palm kernel, or palm oils. Regular salad dressings. Other Pickles and olives. Salted popcorn and pretzels. The items listed above may not be a complete list of foods and beverages to avoid. Contact your dietitian for more information. WHERE CAN I FIND MORE INFORMATION? National Heart, Lung, and Blood Institute: travelstabloid.com Document Released: 07/26/2011 Document Revised: 12/21/2013 Document Reviewed: 06/10/2013 [facility] Patient Information 2015 [place], [place]. This information is not intended to replace advice given to you by your health care provider. Make sure you discuss any questions you have with your health care provider. Hypertension Hypertension, commonly called high blood pressure, is when the force of blood pumping through your arteries is too strong. Your arteries are the blood vessels that carry blood from your heart throughout your body. A blood pressure reading consists of a higher number over a lower number, such as 110/72. The higher  number (systolic) is the pressure inside your arteries when your heart pumps. The lower number (diastolic) is the pressure inside your arteries when your heart relaxes. Ideally you want your blood pressure below 120/80. Hypertension forces your heart to work harder to pump blood. Your arteries may become narrow or stiff. Having hypertension puts you at risk for heart disease, stroke, and other problems.  RISK  FACTORS Some risk factors for high blood pressure are controllable. Others are not.  Risk factors you cannot control include:   Race. You may be at higher risk if you are African American.  Age. Risk increases with age.  Gender. Men are at higher risk than women before age 34 years. After age 5, women are at higher risk than men. Risk factors you can control include:  Not getting enough exercise or physical activity.  Being overweight.  Getting too much fat, sugar, calories, or salt in your diet.  Drinking too much alcohol. SIGNS AND SYMPTOMS Hypertension does not usually cause signs or symptoms. Extremely high blood pressure (hypertensive crisis) may cause headache, anxiety, shortness of breath, and nosebleed. DIAGNOSIS  To check if you have hypertension, your health care provider will measure your blood pressure while you are seated, with your arm held at the level of your heart. It should be measured at least twice using the same arm. Certain conditions can cause a difference in blood pressure between your right and left arms. A blood pressure reading that is higher than normal on one occasion does not mean that you need treatment. If one blood pressure reading is high, ask your health care provider about having it checked again. TREATMENT  Treating high blood pressure includes making lifestyle changes and possibly taking medicine. Living a healthy lifestyle can help lower high blood pressure. You may need to change some of your habits. Lifestyle changes may include:  Following the DASH diet. This diet is high in fruits, vegetables, and whole grains. It is low in salt, red meat, and added sugars.  Getting at least 2 hours of brisk physical activity every week.  Losing weight if necessary.  Not smoking.  Limiting alcoholic beverages.  Learning ways to reduce stress. If lifestyle changes are not enough to get your blood pressure under control, your health care provider may  prescribe medicine. You may need to take more than one. Work closely with your health care provider to understand the risks and benefits. HOME CARE INSTRUCTIONS  Have your blood pressure rechecked as directed by your health care provider.   Take medicines only as directed by your health care provider. Follow the directions carefully. Blood pressure medicines must be taken as prescribed. The medicine does not work as well when you skip doses. Skipping doses also puts you at risk for problems.   Do not smoke.   Monitor your blood pressure at home as directed by your health care provider. SEEK MEDICAL CARE IF:   You think you are having a reaction to medicines taken.  You have recurrent headaches or feel dizzy.  You have swelling in your ankles.  You have trouble with your vision. SEEK IMMEDIATE MEDICAL CARE IF:  You develop a severe headache or confusion.  You have unusual weakness, numbness, or feel faint.  You have severe chest or abdominal pain.  You vomit repeatedly.  You have trouble breathing. MAKE SURE YOU:   Understand these instructions.  Will watch your condition.  Will get help right away if you are not doing well or  get worse. Document Released: 08/06/2005 Document Revised: 12/21/2013 Document Reviewed: 05/29/2013 [facility] Patient Information 2015 [place], [place]. This information is not intended to replace advice given to you by your health care provider. Make sure you discuss any questions you have with your health care provider.

## 2014-08-10 NOTE — Progress Notes (Signed)
Pt is here to establish care. Pt recently had his second toe of his left foot amputated. Pt also reports having a sinus infection. Pt has paper work to be filled out.

## 2014-08-11 LAB — CBC WITH DIFFERENTIAL/PLATELET
Basophils Absolute: 0 10*3/uL (ref 0.0–0.1)
Basophils Relative: 1 % (ref 0–1)
Eosinophils Absolute: 0.3 10*3/uL (ref 0.0–0.7)
Eosinophils Relative: 12 % — ABNORMAL HIGH (ref 0–5)
HEMATOCRIT: 37.2 % — AB (ref 39.0–52.0)
HEMOGLOBIN: 12.3 g/dL — AB (ref 13.0–17.0)
LYMPHS PCT: 40 % (ref 12–46)
Lymphs Abs: 1.1 10*3/uL (ref 0.7–4.0)
MCH: 26.7 pg (ref 26.0–34.0)
MCHC: 33.1 g/dL (ref 30.0–36.0)
MCV: 80.7 fL (ref 78.0–100.0)
MONOS PCT: 14 % — AB (ref 3–12)
MPV: 9.9 fL (ref 9.4–12.4)
Monocytes Absolute: 0.4 10*3/uL (ref 0.1–1.0)
NEUTROS ABS: 0.9 10*3/uL — AB (ref 1.7–7.7)
Neutrophils Relative %: 33 % — ABNORMAL LOW (ref 43–77)
Platelets: 240 10*3/uL (ref 150–400)
RBC: 4.61 MIL/uL (ref 4.22–5.81)
RDW: 14.4 % (ref 11.5–15.5)
WBC: 2.8 10*3/uL — ABNORMAL LOW (ref 4.0–10.5)

## 2014-08-11 LAB — PATHOLOGIST SMEAR REVIEW

## 2014-08-11 LAB — URINALYSIS, COMPLETE
Bacteria, UA: NONE SEEN
Bilirubin Urine: NEGATIVE
Casts: NONE SEEN
Crystals: NONE SEEN
Glucose, UA: NEGATIVE mg/dL
Hgb urine dipstick: NEGATIVE
Ketones, ur: NEGATIVE mg/dL
LEUKOCYTES UA: NEGATIVE
Nitrite: NEGATIVE
PROTEIN: 30 mg/dL — AB
SPECIFIC GRAVITY, URINE: 1.013 (ref 1.005–1.030)
Squamous Epithelial / LPF: NONE SEEN
UROBILINOGEN UA: 0.2 mg/dL (ref 0.0–1.0)
pH: 6 (ref 5.0–8.0)

## 2014-08-11 LAB — PSA: PSA: 0.76 ng/mL (ref <=4.00)

## 2014-08-17 ENCOUNTER — Telehealth: Payer: Self-pay | Admitting: Internal Medicine

## 2014-08-20 DIAGNOSIS — J189 Pneumonia, unspecified organism: Secondary | ICD-10-CM

## 2014-08-20 HISTORY — DX: Pneumonia, unspecified organism: J18.9

## 2014-08-27 ENCOUNTER — Telehealth: Payer: Self-pay | Admitting: Emergency Medicine

## 2014-08-27 NOTE — Telephone Encounter (Signed)
Left message with test results and medication instructions

## 2014-08-27 NOTE — Telephone Encounter (Signed)
-----   Message from Tresa Garter, MD sent at 08/15/2014  1:37 PM EST ----- Please inform patient that his laboratory test results shows the following: 1] Mild anemia most likely iron deficiency, we will await the results of colonoscopy meanwhile will encourage patient to start over-the-counter iron, supplement. #2] slightly elevated liver enzyme but better than previous results, we will monitor the liver function. Other test results are within normal limits.

## 2014-09-19 ENCOUNTER — Encounter (HOSPITAL_COMMUNITY): Payer: Self-pay | Admitting: *Deleted

## 2014-09-19 ENCOUNTER — Emergency Department (INDEPENDENT_AMBULATORY_CARE_PROVIDER_SITE_OTHER)
Admission: EM | Admit: 2014-09-19 | Discharge: 2014-09-19 | Disposition: A | Discharge status: Home / Self Care | Payer: BLUE CROSS/BLUE SHIELD | Source: Home / Self Care | Attending: Family Medicine | Admitting: Family Medicine

## 2014-09-19 DIAGNOSIS — S90425A Blister (nonthermal), left lesser toe(s), initial encounter: Secondary | ICD-10-CM

## 2014-09-19 MED ORDER — CHLORHEXIDINE GLUCONATE 4 % EX LIQD
Freq: Once | CUTANEOUS | Status: AC
  Administered 2014-09-19: 12:00:00 via TOPICAL

## 2014-09-19 MED ORDER — POVIDONE-IODINE 10 % EX SOLN
CUTANEOUS | Status: AC
  Filled 2014-09-19: qty 118

## 2014-09-19 NOTE — ED Provider Notes (Signed)
CSN: [id number]     Arrival date & time 09/19/14  1048 History   First MD Initiated Contact with Patient 09/19/14 1121     Chief Complaint  Patient presents with  . Toe Pain   (Consider location/radiation/quality/duration/timing/severity/associated sxs/prior Treatment) Patient is a 59 y.o. male presenting with toe pain. The history is provided by the patient.  Toe Pain This is a new problem. The current episode started yesterday (left gt toe medial blister).    Past Medical History  Diagnosis Date  . Hypertension   . Pancreatitis, acute    Past Surgical History  Procedure Laterality Date  . Tonsillectomy    . Amputation Left 07/14/2014    Procedure: 2nd Toe Amputation;  Surgeon: [name], MD;  Location: [place];  Service: Orthopedics;  Laterality: Left;   Family History  Problem Relation Age of Onset  . Adopted: Yes   History  Substance Use Topics  . Smoking status: Never Smoker   . Smokeless tobacco: Not on file  . Alcohol Use: Yes    Review of Systems  Constitutional: Negative.   Musculoskeletal: Negative for gait problem.  Skin: Positive for wound.    Allergies  Shellfish allergy  Home Medications   Prior to Admission medications   Medication Sig Start Date End Date Taking? Authorizing Provider  albuterol (PROVENTIL HFA;VENTOLIN HFA) 108 (90 BASE) MCG/ACT inhaler Inhale 2 puffs into the lungs every 6 (six) hours as needed for wheezing or shortness of breath.    Historical Provider, MD  allopurinol (ZYLOPRIM) 100 MG tablet  07/19/14   Historical Provider, MD  Bioflavonoid Products (ESTER C PO) Take 1 tablet by mouth daily.    Historical Provider, MD  COLCRYS 0.6 MG tablet  08/06/14   Historical Provider, MD  Digestive Enzymes (DIGESTIVE ENZYME PO) Take 1 capsule by mouth 3 (three) times daily with meals. [place] brand    Historical Provider, MD  erythromycin [facility]) ophthalmic ointment Place 1 application into both eyes at bedtime. 08/10/14   Tresa Garter, MD  fluticasone (FLONASE) 50 MCG/ACT nasal spray Place 1 spray into both nostrils daily. 08/10/14   Tresa Garter, MD  Fluticasone-Salmeterol (ADVAIR) 500-50 MCG/DOSE AEPB Inhale 1 puff into the lungs 2 (two) times daily.    Historical Provider, MD  hydrochlorothiazide (HYDRODIURIL) 25 MG tablet Take 1 tablet (25 mg total) by mouth daily. 08/10/14   Tresa Garter, MD  ibuprofen (ADVIL,MOTRIN) 800 MG tablet Take 1 tablet (800 mg total) by mouth every 8 (eight) hours as needed for mild pain. 08/10/14   Tresa Garter, MD  loratadine (CLARITIN) 10 MG tablet Take 10 mg by mouth daily.    Historical Provider, MD  Multiple Vitamin (MULTIVITAMIN WITH MINERALS) TABS tablet Take 1 tablet by mouth daily.    Historical Provider, MD  OVER THE COUNTER MEDICATION Take 1 capsule by mouth 3 (three) times daily with meals. Blue/green algae    Historical Provider, MD  oxyCODONE-acetaminophen (ROXICET) 5-325 MG per tablet Take 1 tablet by mouth every 4 (four) hours as needed for severe pain. 07/14/14   [name], MD  Pancrelipase, Lip-Prot-Amyl, (CREON) 24000 UNITS CPEP Take 1 capsule (24,000 Units total) by mouth daily. 08/10/14   Tresa Garter, MD  pantoprazole (PROTONIX) 40 MG tablet Take 1 tablet (40 mg total) by mouth daily. 08/10/14   Tresa Garter, MD   There were no vitals taken for this visit. Physical Exam  Constitutional: He is oriented to person, place,  and time. He appears well-developed and well-nourished.  Musculoskeletal: He exhibits tenderness.  Hemorrhagic blister to medial left gt toe, no infection., debrided.  Neurological: He is alert and oriented to person, place, and time.  Skin: Skin is warm and dry. No erythema.  Nursing note and vitals reviewed.   ED Course  Procedures (including critical care time) Labs Review Labs Reviewed - No data to display  Imaging Review No results found.   MDM   1. Toe blister without infection, left, initial  encounter        Billy Fischer, MD 09/19/14 [phone]

## 2014-09-19 NOTE — Discharge Instructions (Signed)
Soak in warm salt water 1-2 times a day, cover to protect, see dr duda if any poblems.

## 2014-09-19 NOTE — ED Notes (Signed)
Pt  Has  A  Blister  On l big  Toe      For  sev  Days        painfull  To  Touch    Denys  specefic  Injury      Ambulated  To  Room  In no  Acute  Distress  Skin is  Warm  And  Dry  Pt is  Awake  And  Alert

## 2014-09-28 ENCOUNTER — Emergency Department (INDEPENDENT_AMBULATORY_CARE_PROVIDER_SITE_OTHER)
Admission: EM | Admit: 2014-09-28 | Discharge: 2014-09-28 | Disposition: A | Discharge status: Home / Self Care | Payer: BLUE CROSS/BLUE SHIELD | Source: Home / Self Care | Attending: Emergency Medicine | Admitting: Emergency Medicine

## 2014-09-28 ENCOUNTER — Encounter (HOSPITAL_COMMUNITY): Payer: Self-pay | Admitting: Emergency Medicine

## 2014-09-28 DIAGNOSIS — R42 Dizziness and giddiness: Secondary | ICD-10-CM | POA: Diagnosis not present

## 2014-09-28 DIAGNOSIS — R531 Weakness: Secondary | ICD-10-CM

## 2014-09-28 LAB — POCT I-STAT, CHEM 8
BUN: 16 mg/dL (ref 6–23)
CALCIUM ION: 1.15 mmol/L (ref 1.12–1.23)
CREATININE: 1.1 mg/dL (ref 0.50–1.35)
Chloride: 98 mmol/L (ref 96–112)
Glucose, Bld: 97 mg/dL (ref 70–99)
HEMATOCRIT: 47 % (ref 39.0–52.0)
Hemoglobin: 16 g/dL (ref 13.0–17.0)
Potassium: 3.4 mmol/L — ABNORMAL LOW (ref 3.5–5.1)
Sodium: 139 mmol/L (ref 135–145)
TCO2: 23 mmol/L (ref 0–100)

## 2014-09-28 NOTE — ED Provider Notes (Signed)
CSN: [id number]     Arrival date & time 09/28/14  1854 History   First MD Initiated Contact with Patient 09/28/14 1914     Chief Complaint  Patient presents with  . Weakness  . Dizziness  . Night Sweats   (Consider location/radiation/quality/duration/timing/severity/associated sxs/prior Treatment) HPI Comments: 59 year old male was walking to the bathroom yesterday and experienced sudden onset of dizziness and a feeling of weakness in his legs. He fell down and broke out into a sweat. He denies having chest pain, heaviness, tightness, pressure or squeezing. He denied acute shortness of breath. The dizziness and weakness lasted approximately 5 minutes. Sometime after this incident he felt a hard cramp in the left side of his left leg. Since then he feels generally weak with weakness in the legs. Other than that he feels better.   Past Medical History  Diagnosis Date  . Hypertension   . Pancreatitis, acute    Past Surgical History  Procedure Laterality Date  . Tonsillectomy    . Amputation Left 07/14/2014    Procedure: 2nd Toe Amputation;  Surgeon: [name], MD;  Location: [facility];  Service: Orthopedics;  Laterality: Left;   Family History  Problem Relation Age of Onset  . Adopted: Yes   History  Substance Use Topics  . Smoking status: Never Smoker   . Smokeless tobacco: Not on file  . Alcohol Use: Yes    Review of Systems  Constitutional: Negative for fever, activity change and appetite change.  HENT: Negative for congestion, dental problem, ear pain, facial swelling, postnasal drip, sore throat, trouble swallowing and voice change.   Eyes: Negative for pain and visual disturbance.  Respiratory: Negative for cough, chest tightness, shortness of breath and wheezing.   Cardiovascular: Negative for chest pain, palpitations and leg swelling.  Gastrointestinal: Positive for nausea.  Genitourinary: Negative.   Musculoskeletal: Positive for neck pain.  Skin: Negative.    Neurological: Positive for dizziness, weakness and headaches. Negative for tremors, syncope, facial asymmetry, speech difficulty and numbness.       These particular symptoms abdomen improved substantially since yesterday.    Allergies  Shellfish allergy  Home Medications   Prior to Admission medications   Medication Sig Start Date End Date Taking? Authorizing Provider  albuterol (PROVENTIL HFA;VENTOLIN HFA) 108 (90 BASE) MCG/ACT inhaler Inhale 2 puffs into the lungs every 6 (six) hours as needed for wheezing or shortness of breath.   Yes Historical Provider, MD  Bioflavonoid Products (ESTER C PO) Take 1 tablet by mouth daily.   Yes Historical Provider, MD  COLCRYS 0.6 MG tablet  08/06/14  Yes Historical Provider, MD  Digestive Enzymes (DIGESTIVE ENZYME PO) Take 1 capsule by mouth 3 (three) times daily with meals. [place] brand   Yes Historical Provider, MD  hydrochlorothiazide (HYDRODIURIL) 25 MG tablet Take 1 tablet (25 mg total) by mouth daily. 08/10/14  Yes Tresa Garter, MD  loratadine (CLARITIN) 10 MG tablet Take 10 mg by mouth daily.   Yes Historical Provider, MD  Pancrelipase, Lip-Prot-Amyl, (CREON) 24000 UNITS CPEP Take 1 capsule (24,000 Units total) by mouth daily. 08/10/14  Yes Tresa Garter, MD  pantoprazole (PROTONIX) 40 MG tablet Take 1 tablet (40 mg total) by mouth daily. 08/10/14  Yes Tresa Garter, MD  allopurinol (ZYLOPRIM) 100 MG tablet  07/19/14   Historical Provider, MD  erythromycin [facility]) ophthalmic ointment Place 1 application into both eyes at bedtime. 08/10/14   Tresa Garter, MD  fluticasone (FLONASE) 50 MCG/ACT nasal  spray Place 1 spray into both nostrils daily. 08/10/14   Tresa Garter, MD  Fluticasone-Salmeterol (ADVAIR) 500-50 MCG/DOSE AEPB Inhale 1 puff into the lungs 2 (two) times daily.    Historical Provider, MD  ibuprofen (ADVIL,MOTRIN) 800 MG tablet Take 1 tablet (800 mg total) by mouth every 8 (eight) hours as needed for mild  pain. 08/10/14   Tresa Garter, MD  Multiple Vitamin (MULTIVITAMIN WITH MINERALS) TABS tablet Take 1 tablet by mouth daily.    Historical Provider, MD  OVER THE COUNTER MEDICATION Take 1 capsule by mouth 3 (three) times daily with meals. Blue/green algae    Historical Provider, MD  oxyCODONE-acetaminophen (ROXICET) 5-325 MG per tablet Take 1 tablet by mouth every 4 (four) hours as needed for severe pain. 07/14/14   [name], MD   BP 138/88 mmHg  Pulse 104  Temp(Src) 99.9 F (37.7 C) (Oral)  Resp 20  SpO2 99% Physical Exam  Constitutional: He is oriented to person, place, and time. He appears well-developed and well-nourished. No distress.  HENT:  Head: Normocephalic and atraumatic.  Right Ear: External ear normal.  Left Ear: External ear normal.  Bilateral TMs are normal Oropharynx with minor erythema and scant PND. No exudates. Soft palate rises symmetrically. No intraoral swelling or erythema.  Eyes: Conjunctivae and EOM are normal. Pupils are equal, round, and reactive to light. Right eye exhibits no discharge. Left eye exhibits no discharge.  Neck: Normal range of motion. Neck supple.  Cardiovascular: Normal rate, regular rhythm, normal heart sounds and intact distal pulses.   No murmur heard. Pulmonary/Chest: Effort normal and breath sounds normal. No respiratory distress. He has no wheezes. He has no rales.  Abdominal: Soft. There is no tenderness. There is no rebound.  Vague "stomach pain"  Musculoskeletal: He exhibits no edema or tenderness.  Lymphadenopathy:    He has no cervical adenopathy.  Neurological: He is alert and oriented to person, place, and time. He has normal strength. He displays no tremor. No cranial nerve deficit or sensory deficit. He exhibits normal muscle tone.  Reflex Scores:      Patellar reflexes are 0 on the right side and 0 on the left side. Romberg is positive. Heel to toe is abnormal. He is unable to walk a straight line. He attributes  this to having  Toe amputation and malposition of an ankle bone to the left foot. He states he has had balance problems for several months to over a year.  Skin: Skin is warm and dry.  Psychiatric: He has a normal mood and affect.  Nursing note and vitals reviewed.   ED Course  Procedures (including critical care time) Labs Review Labs Reviewed  POCT I-STAT, CHEM 8 - Abnormal; Notable for the following:    Potassium 3.4 (*)    All other components within normal limits   Results for orders placed or performed during the hospital encounter of 09/28/14  I-STAT, chem 8  Result Value Ref Range   Sodium 139 135 - 145 mmol/L   Potassium 3.4 (L) 3.5 - 5.1 mmol/L   Chloride 98 96 - 112 mmol/L   BUN 16 6 - 23 mg/dL   Creatinine, Ser 1.10 0.50 - 1.35 mg/dL   Glucose, Bld 97 70 - 99 mg/dL   Calcium, Ion 1.15 1.12 - 1.23 mmol/L   TCO2 23 0 - 100 mmol/L   Hemoglobin 16.0 13.0 - 17.0 g/dL   HCT 47.0 39.0 - 52.0 %    Imaging  Review No results found.   MDM   1. Dizziness   2. Weakness     Did not go to work or drive if you are having dizziness. If the symptoms return as they occurred yesterday go to the emergency department. Call to get an appointment with your primary care doctor tomorrow. Patient is stable at this time and states he does feel better. He will be given a note to stay out of work tomorrow. COnsulted with Dr. Koren Shiver, NP 09/28/14 2037  Janne Napoleon, NP 09/28/14 2038

## 2014-09-28 NOTE — Discharge Instructions (Signed)
Dizziness Did not go to work or drive if you are having dizziness. If the symptoms return as they occurred yesterday go to the emergency department. Call to get an appointment with your primary care doctor tomorrow.  Weakness Weakness is a lack of strength. It may be felt all over the body (generalized) or in one specific part of the body (focal). Some causes of weakness can be serious. You may need further medical evaluation, especially if you are elderly or you have a history of immunosuppression (such as chemotherapy or HIV), kidney disease, heart disease, or diabetes. CAUSES  Weakness can be caused by many different things, including:  Infection.  Physical exhaustion.  Internal bleeding or other blood loss that results in a lack of red blood cells (anemia).  Dehydration. This cause is more common in elderly people.  Side effects or electrolyte abnormalities from medicines, such as pain medicines or sedatives.  Emotional distress, anxiety, or depression.  Circulation problems, especially severe peripheral arterial disease.  Heart disease, such as rapid atrial fibrillation, bradycardia, or heart failure.  Nervous system disorders, such as Guillain-Barr syndrome, multiple sclerosis, or stroke. DIAGNOSIS  To find the cause of your weakness, your caregiver will take your history and perform a physical exam. Lab tests or X-rays may also be ordered, if needed. TREATMENT  Treatment of weakness depends on the cause of your symptoms and can vary greatly. HOME CARE INSTRUCTIONS   Rest as needed.  Eat a well-balanced diet.  Try to get some exercise every day.  Only take over-the-counter or prescription medicines as directed by your caregiver. SEEK MEDICAL CARE IF:   Your weakness seems to be getting worse or spreads to other parts of your body.  You develop new aches or pains. SEEK IMMEDIATE MEDICAL CARE IF:   You cannot perform your normal daily activities, such as getting  dressed and feeding yourself.  You cannot walk up and down stairs, or you feel exhausted when you do so.  You have shortness of breath or chest pain.  You have difficulty moving parts of your body.  You have weakness in only one area of the body or on only one side of the body.  You have a fever.  You have trouble speaking or swallowing.  You cannot control your bladder or bowel movements.  You have black or bloody vomit or stools. MAKE SURE YOU:  Understand these instructions.  Will watch your condition.  Will get help right away if you are not doing well or get worse. Document Released: 08/06/2005 Document Revised: 02/05/2012 Document Reviewed: 10/05/2011 [facility] Patient Information 2015 [place], [place]. This information is not intended to replace advice given to you by your health care provider. Make sure you discuss any questions you have with your health care provider. Dizziness is a common problem. It is a feeling of unsteadiness or light-headedness. You may feel like you are about to faint. Dizziness can lead to injury if you stumble or fall. A person of any age group can suffer from dizziness, but dizziness is more common in older adults. CAUSES  Dizziness can be caused by many different things, including:  Middle ear problems.  Standing for too long.  Infections.  An allergic reaction.  Aging.  An emotional response to something, such as the sight of blood.  Side effects of medicines.  Tiredness.  Problems with circulation or blood pressure.  Excessive use of alcohol or medicines, or illegal drug use.  Breathing too fast (hyperventilation).  An irregular  heart rhythm (arrhythmia).  A low red blood cell count (anemia).  Pregnancy.  Vomiting, diarrhea, fever, or other illnesses that cause body fluid loss (dehydration).  Diseases or conditions such as Parkinson's disease, high blood pressure (hypertension), diabetes, and thyroid problems.  Exposure  to extreme heat. DIAGNOSIS  Your health care provider will ask about your symptoms, perform a physical exam, and perform an electrocardiogram (ECG) to record the electrical activity of your heart. Your health care provider may also perform other heart or blood tests to determine the cause of your dizziness. These may include:  Transthoracic echocardiogram (TTE). During echocardiography, sound waves are used to evaluate how blood flows through your heart.  Transesophageal echocardiogram (TEE).  Cardiac monitoring. This allows your health care provider to monitor your heart rate and rhythm in real time.  Holter monitor. This is a portable device that records your heartbeat and can help diagnose heart arrhythmias. It allows your health care provider to track your heart activity for several days if needed.  Stress tests by exercise or by giving medicine that makes the heart beat faster. TREATMENT  Treatment of dizziness depends on the cause of your symptoms and can vary greatly. HOME CARE INSTRUCTIONS   Drink enough fluids to keep your urine clear or pale yellow. This is especially important in very hot weather. In older adults, it is also important in cold weather.  Take your medicine exactly as directed if your dizziness is caused by medicines. When taking blood pressure medicines, it is especially important to get up slowly.  Rise slowly from chairs and steady yourself until you feel okay.  In the morning, first sit up on the side of the bed. When you feel okay, stand slowly while holding onto something until you know your balance is fine.  Move your legs often if you need to stand in one place for a long time. Tighten and relax your muscles in your legs while standing.  Have someone stay with you for 1-2 days if dizziness continues to be a problem. Do this until you feel you are well enough to stay alone. Have the person call your health care provider if he or she notices changes in you  that are concerning.  Do not drive or use heavy machinery if you feel dizzy.  Do not drink alcohol. SEEK IMMEDIATE MEDICAL CARE IF:   Your dizziness or light-headedness gets worse.  You feel nauseous or vomit.  You have problems talking, walking, or using your arms, hands, or legs.  You feel weak.  You are not thinking clearly or you have trouble forming sentences. It may take a friend or family member to notice this.  You have chest pain, abdominal pain, shortness of breath, or sweating.  Your vision changes.  You notice any bleeding.  You have side effects from medicine that seems to be getting worse rather than better. MAKE SURE YOU:   Understand these instructions.  Will watch your condition.  Will get help right away if you are not doing well or get worse. Document Released: 01/30/2001 Document Revised: 08/11/2013 Document Reviewed: 02/23/2011 [facility] Patient Information 2015 [place], [place]. This information is not intended to replace advice given to you by your health care provider. Make sure you discuss any questions you have with your health care provider.

## 2014-09-28 NOTE — ED Notes (Signed)
Reports having a episode with acute on set of feeling weak, dizzy, nausea, and breaking out in a sweat.   States "I dropped to my knees when the episode hit".   States this has never happened before.  Episode happened Monday.     Pt has not had any further problems.   Denies any cardiac hx.   No fever, vomiting, or diarrhea.

## 2014-10-21 ENCOUNTER — Ambulatory Visit: Payer: BLUE CROSS/BLUE SHIELD | Attending: Internal Medicine | Admitting: Internal Medicine

## 2014-10-21 ENCOUNTER — Encounter: Payer: Self-pay | Admitting: Internal Medicine

## 2014-10-21 VITALS — BP 153/83 | HR 75 | Temp 97.8°F | Resp 18 | Ht 74.0 in | Wt 193.0 lb

## 2014-10-21 DIAGNOSIS — L309 Dermatitis, unspecified: Secondary | ICD-10-CM | POA: Diagnosis present

## 2014-10-21 DIAGNOSIS — R55 Syncope and collapse: Secondary | ICD-10-CM

## 2014-10-21 DIAGNOSIS — I10 Essential (primary) hypertension: Secondary | ICD-10-CM | POA: Diagnosis not present

## 2014-10-21 LAB — BASIC METABOLIC PANEL
BUN: 15 mg/dL (ref 6–23)
CO2: 26 meq/L (ref 19–32)
CREATININE: 0.92 mg/dL (ref 0.50–1.35)
Calcium: 9.2 mg/dL (ref 8.4–10.5)
Chloride: 103 mEq/L (ref 96–112)
Glucose, Bld: 82 mg/dL (ref 70–99)
POTASSIUM: 3.5 meq/L (ref 3.5–5.3)
SODIUM: 139 meq/L (ref 135–145)

## 2014-10-21 MED ORDER — FLUTICASONE-SALMETEROL 500-50 MCG/DOSE IN AEPB: 1.0000 | INHALATION_SPRAY | Freq: Two times a day (BID) | RESPIRATORY_TRACT | Status: DC

## 2014-10-21 MED ORDER — CLOBETASOL PROPIONATE 0.05 % EX CREA: 1.0000 "application " | TOPICAL_CREAM | Freq: Two times a day (BID) | CUTANEOUS | Status: DC

## 2014-10-21 NOTE — Patient Instructions (Signed)
DASH Eating Plan DASH stands for "Dietary Approaches to Stop Hypertension." The DASH eating plan is a healthy eating plan that has been shown to reduce high blood pressure (hypertension). Additional health benefits may include reducing the risk of type 2 diabetes mellitus, heart disease, and stroke. The DASH eating plan may also help with weight loss. WHAT DO I NEED TO KNOW ABOUT THE DASH EATING PLAN? For the DASH eating plan, you will follow these general guidelines:  Choose foods with a percent daily value for sodium of less than 5% (as listed on the food label).  Use salt-free seasonings or herbs instead of table salt or sea salt.  Check with your health care provider or pharmacist before using salt substitutes.  Eat lower-sodium products, often labeled as "lower sodium" or "no salt added."  Eat fresh foods.  Eat more vegetables, fruits, and low-fat dairy products.  Choose whole grains. Look for the word "whole" as the first word in the ingredient list.  Choose fish and skinless chicken or turkey more often than red meat. Limit fish, poultry, and meat to 6 oz (170 g) each day.  Limit sweets, desserts, sugars, and sugary drinks.  Choose heart-healthy fats.  Limit cheese to 1 oz (28 g) per day.  Eat more home-cooked food and less restaurant, buffet, and fast food.  Limit fried foods.  Cook foods using methods other than frying.  Limit canned vegetables. If you do use them, rinse them well to decrease the sodium.  When eating at a restaurant, ask that your food be prepared with less salt, or no salt if possible. WHAT FOODS CAN I EAT? Seek help from a dietitian for individual calorie needs. Grains Whole grain or whole wheat bread. Brown rice. Whole grain or whole wheat pasta. Quinoa, bulgur, and whole grain cereals. Low-sodium cereals. Corn or whole wheat flour tortillas. Whole grain cornbread. Whole grain crackers. Low-sodium crackers. Vegetables Fresh or frozen vegetables  (raw, steamed, roasted, or grilled). Low-sodium or reduced-sodium tomato and vegetable juices. Low-sodium or reduced-sodium tomato sauce and paste. Low-sodium or reduced-sodium canned vegetables.  Fruits All fresh, canned (in natural juice), or frozen fruits. Meat and Other Protein Products Ground beef (85% or leaner), grass-fed beef, or beef trimmed of fat. Skinless chicken or turkey. Ground chicken or turkey. Pork trimmed of fat. All fish and seafood. Eggs. Dried beans, peas, or lentils. Unsalted nuts and seeds. Unsalted canned beans. Dairy Low-fat dairy products, such as skim or 1% milk, 2% or reduced-fat cheeses, low-fat ricotta or cottage cheese, or plain low-fat yogurt. Low-sodium or reduced-sodium cheeses. Fats and Oils Tub margarines without trans fats. Light or reduced-fat mayonnaise and salad dressings (reduced sodium). Avocado. Safflower, olive, or canola oils. Natural peanut or almond butter. Other Unsalted popcorn and pretzels. The items listed above may not be a complete list of recommended foods or beverages. Contact your dietitian for more options. WHAT FOODS ARE NOT RECOMMENDED? Grains White bread. White pasta. White rice. Refined cornbread. Bagels and croissants. Crackers that contain trans fat. Vegetables Creamed or fried vegetables. Vegetables in a cheese sauce. Regular canned vegetables. Regular canned tomato sauce and paste. Regular tomato and vegetable juices. Fruits Dried fruits. Canned fruit in light or heavy syrup. Fruit juice. Meat and Other Protein Products Fatty cuts of meat. Ribs, chicken wings, bacon, sausage, bologna, salami, chitterlings, fatback, hot dogs, bratwurst, and packaged luncheon meats. Salted nuts and seeds. Canned beans with salt. Dairy Whole or 2% milk, cream, half-and-half, and cream cheese. Whole-fat or sweetened yogurt. Full-fat   cheeses or blue cheese. Nondairy creamers and whipped toppings. Processed cheese, cheese spreads, or cheese  curds. Condiments Onion and garlic salt, seasoned salt, table salt, and sea salt. Canned and packaged gravies. Worcestershire sauce. Tartar sauce. Barbecue sauce. Teriyaki sauce. Soy sauce, including reduced sodium. Steak sauce. Fish sauce. Oyster sauce. Cocktail sauce. Horseradish. Ketchup and mustard. Meat flavorings and tenderizers. Bouillon cubes. Hot sauce. Tabasco sauce. Marinades. Taco seasonings. Relishes. Fats and Oils Butter, stick margarine, lard, shortening, ghee, and bacon fat. Coconut, palm kernel, or palm oils. Regular salad dressings. Other Pickles and olives. Salted popcorn and pretzels. The items listed above may not be a complete list of foods and beverages to avoid. Contact your dietitian for more information. WHERE CAN I FIND MORE INFORMATION? National Heart, Lung, and Blood Institute: travelstabloid.com Document Released: 07/26/2011 Document Revised: 12/21/2013 Document Reviewed: 06/10/2013 [facility] Patient Information 2015 [place], [place]. This information is not intended to replace advice given to you by your health care provider. Make sure you discuss any questions you have with your health care provider. Hypertension Hypertension, commonly called high blood pressure, is when the force of blood pumping through your arteries is too strong. Your arteries are the blood vessels that carry blood from your heart throughout your body. A blood pressure reading consists of a higher number over a lower number, such as 110/72. The higher number (systolic) is the pressure inside your arteries when your heart pumps. The lower number (diastolic) is the pressure inside your arteries when your heart relaxes. Ideally you want your blood pressure below 120/80. Hypertension forces your heart to work harder to pump blood. Your arteries may become narrow or stiff. Having hypertension puts you at risk for heart disease, stroke, and other problems.  RISK  FACTORS Some risk factors for high blood pressure are controllable. Others are not.  Risk factors you cannot control include:   Race. You may be at higher risk if you are African American.  Age. Risk increases with age.  Gender. Men are at higher risk than women before age 37 years. After age 55, women are at higher risk than men. Risk factors you can control include:  Not getting enough exercise or physical activity.  Being overweight.  Getting too much fat, sugar, calories, or salt in your diet.  Drinking too much alcohol. SIGNS AND SYMPTOMS Hypertension does not usually cause signs or symptoms. Extremely high blood pressure (hypertensive crisis) may cause headache, anxiety, shortness of breath, and nosebleed. DIAGNOSIS  To check if you have hypertension, your health care provider will measure your blood pressure while you are seated, with your arm held at the level of your heart. It should be measured at least twice using the same arm. Certain conditions can cause a difference in blood pressure between your right and left arms. A blood pressure reading that is higher than normal on one occasion does not mean that you need treatment. If one blood pressure reading is high, ask your health care provider about having it checked again. TREATMENT  Treating high blood pressure includes making lifestyle changes and possibly taking medicine. Living a healthy lifestyle can help lower high blood pressure. You may need to change some of your habits. Lifestyle changes may include:  Following the DASH diet. This diet is high in fruits, vegetables, and whole grains. It is low in salt, red meat, and added sugars.  Getting at least 2 hours of brisk physical activity every week.  Losing weight if necessary.  Not smoking.  Limiting  alcoholic beverages.  Learning ways to reduce stress. If lifestyle changes are not enough to get your blood pressure under control, your health care provider may  prescribe medicine. You may need to take more than one. Work closely with your health care provider to understand the risks and benefits. HOME CARE INSTRUCTIONS  Have your blood pressure rechecked as directed by your health care provider.   Take medicines only as directed by your health care provider. Follow the directions carefully. Blood pressure medicines must be taken as prescribed. The medicine does not work as well when you skip doses. Skipping doses also puts you at risk for problems.   Do not smoke.   Monitor your blood pressure at home as directed by your health care provider. SEEK MEDICAL CARE IF:   You think you are having a reaction to medicines taken.  You have recurrent headaches or feel dizzy.  You have swelling in your ankles.  You have trouble with your vision. SEEK IMMEDIATE MEDICAL CARE IF:  You develop a severe headache or confusion.  You have unusual weakness, numbness, or feel faint.  You have severe chest or abdominal pain.  You vomit repeatedly.  You have trouble breathing. MAKE SURE YOU:   Understand these instructions.  Will watch your condition.  Will get help right away if you are not doing well or get worse. Document Released: 08/06/2005 Document Revised: 12/21/2013 Document Reviewed: 05/29/2013 [facility] Patient Information 2015 [place], [place]. This information is not intended to replace advice given to you by your health care provider. Make sure you discuss any questions you have with your health care provider. Dizziness Dizziness is a common problem. It is a feeling of unsteadiness or light-headedness. You may feel like you are about to faint. Dizziness can lead to injury if you stumble or fall. A person of any age group can suffer from dizziness, but dizziness is more common in older adults. CAUSES  Dizziness can be caused by many different things, including:  Middle ear problems.  Standing for too long.  Infections.  An allergic  reaction.  Aging.  An emotional response to something, such as the sight of blood.  Side effects of medicines.  Tiredness.  Problems with circulation or blood pressure.  Excessive use of alcohol or medicines, or illegal drug use.  Breathing too fast (hyperventilation).  An irregular heart rhythm (arrhythmia).  A low red blood cell count (anemia).  Pregnancy.  Vomiting, diarrhea, fever, or other illnesses that cause body fluid loss (dehydration).  Diseases or conditions such as Parkinson's disease, high blood pressure (hypertension), diabetes, and thyroid problems.  Exposure to extreme heat. DIAGNOSIS  Your health care provider will ask about your symptoms, perform a physical exam, and perform an electrocardiogram (ECG) to record the electrical activity of your heart. Your health care provider may also perform other heart or blood tests to determine the cause of your dizziness. These may include:  Transthoracic echocardiogram (TTE). During echocardiography, sound waves are used to evaluate how blood flows through your heart.  Transesophageal echocardiogram (TEE).  Cardiac monitoring. This allows your health care provider to monitor your heart rate and rhythm in real time.  Holter monitor. This is a portable device that records your heartbeat and can help diagnose heart arrhythmias. It allows your health care provider to track your heart activity for several days if needed.  Stress tests by exercise or by giving medicine that makes the heart beat faster. TREATMENT  Treatment of dizziness depends on the cause  of your symptoms and can vary greatly. HOME CARE INSTRUCTIONS   Drink enough fluids to keep your urine clear or pale yellow. This is especially important in very hot weather. In older adults, it is also important in cold weather.  Take your medicine exactly as directed if your dizziness is caused by medicines. When taking blood pressure medicines, it is especially  important to get up slowly.  Rise slowly from chairs and steady yourself until you feel okay.  In the morning, first sit up on the side of the bed. When you feel okay, stand slowly while holding onto something until you know your balance is fine.  Move your legs often if you need to stand in one place for a long time. Tighten and relax your muscles in your legs while standing.  Have someone stay with you for 1-2 days if dizziness continues to be a problem. Do this until you feel you are well enough to stay alone. Have the person call your health care provider if he or she notices changes in you that are concerning.  Do not drive or use heavy machinery if you feel dizzy.  Do not drink alcohol. SEEK IMMEDIATE MEDICAL CARE IF:   Your dizziness or light-headedness gets worse.  You feel nauseous or vomit.  You have problems talking, walking, or using your arms, hands, or legs.  You feel weak.  You are not thinking clearly or you have trouble forming sentences. It may take a friend or family member to notice this.  You have chest pain, abdominal pain, shortness of breath, or sweating.  Your vision changes.  You notice any bleeding.  You have side effects from medicine that seems to be getting worse rather than better. MAKE SURE YOU:   Understand these instructions.  Will watch your condition.  Will get help right away if you are not doing well or get worse. Document Released: 01/30/2001 Document Revised: 08/11/2013 Document Reviewed: 02/23/2011 [facility] Patient Information 2015 [place], [place]. This information is not intended to replace advice given to you by your health care provider. Make sure you discuss any questions you have with your health care provider.

## 2014-10-21 NOTE — Progress Notes (Signed)
:   59 year old male was walking to the bathroom yesterday and experienced sudden onset of dizziness and a feeling of weakness in his legs. He fell down and broke out into a sweat. He denies having chest pain, heaviness, tightness, pressure or squeezing. He denied acute shortness of breath. The dizziness and weakness lasted approximately 5 minutes. Sometime after this incident he felt a hard cramp in the left side of his left leg. Since then he feels generally weak with weakness in the legs. Other than that he feels better.   Pt returns today to f/u dizziness and feeling faint while at home 3 weeks ago Seen in ER with negative work-up Denies dizziness or syncope C/o itchy, rash all over  Need refill on previous medication Clobetasol

## 2014-10-21 NOTE — Progress Notes (Signed)
Patient ID: Stephen Whitaker, male   DOB: 28-Dec-1955, 60 y.o.   MRN: [id number]   Stephen Whitaker, is a 59 y.o. male  [id number]  [id number]  DOB - [birth year]  Chief Complaint  Patient presents with  . Follow-up  . Dizziness  . Rash        Subjective:   Stephen Whitaker is a 59 y.o. male here today for a follow up visit. Patient has history of hypertension recently seen in the ED for acute onset of dizziness and a feeling of weakness in his legs. Patient fell down and broke out in sweats, denies chest pain or heaviness or tightness. He denied shortness of breath. He was evaluated in the ED found to be stable clinically and discharged to be followed up in the clinic today for further workup. Patient has no new complaint today, denies ongoing dizziness or syncope. No complaints of itchy and rash all over. He had his itchiness and rash before and was prescribed clobetasol which he had the rashes are not his request a refill of clobetasol. Patient has allergy to shellfish but has not eaten shellfish recently, does not know aggravating factor to this itchiness and rash. Patient has No headache, No chest pain, No abdominal pain - No Nausea, No new weakness tingling or numbness, No Cough - SOB.  Problem  Eczema  Essential Hypertension  Vasovagal Syncope    ALLERGIES: Allergies  Allergen Reactions  . Shellfish Allergy Swelling    PAST MEDICAL HISTORY: Past Medical History  Diagnosis Date  . Hypertension   . Pancreatitis, acute     MEDICATIONS AT HOME: Prior to Admission medications   Medication Sig Start Date End Date Taking? Authorizing Provider  albuterol (PROVENTIL HFA;VENTOLIN HFA) 108 (90 BASE) MCG/ACT inhaler Inhale 2 puffs into the lungs every 6 (six) hours as needed for wheezing or shortness of breath.   Yes Historical Provider, MD  allopurinol (ZYLOPRIM) 100 MG tablet  07/19/14  Yes Historical Provider, MD  COLCRYS 0.6 MG tablet  08/06/14  Yes Historical  Provider, MD  Digestive Enzymes (DIGESTIVE ENZYME PO) Take 1 capsule by mouth 3 (three) times daily with meals. [place] brand   Yes Historical Provider, MD  fluticasone (FLONASE) 50 MCG/ACT nasal spray Place 1 spray into both nostrils daily. 08/10/14  Yes Tresa Garter, MD  Fluticasone-Salmeterol (ADVAIR) 500-50 MCG/DOSE AEPB Inhale 1 puff into the lungs 2 (two) times daily. 10/21/14  Yes Tresa Garter, MD  hydrochlorothiazide (HYDRODIURIL) 25 MG tablet Take 1 tablet (25 mg total) by mouth daily. 08/10/14  Yes Tresa Garter, MD  Multiple Vitamin (MULTIVITAMIN WITH MINERALS) TABS tablet Take 1 tablet by mouth daily.   Yes Historical Provider, MD  Pancrelipase, Lip-Prot-Amyl, (CREON) 24000 UNITS CPEP Take 1 capsule (24,000 Units total) by mouth daily. 08/10/14  Yes Tresa Garter, MD  pantoprazole (PROTONIX) 40 MG tablet Take 1 tablet (40 mg total) by mouth daily. 08/10/14  Yes Tresa Garter, MD  Bioflavonoid Products (ESTER C PO) Take 1 tablet by mouth daily.    Historical Provider, MD  clobetasol cream (TEMOVATE) 2.56 % Apply 1 application topically 2 (two) times daily. 10/21/14   Tresa Garter, MD  erythromycin [facility]) ophthalmic ointment Place 1 application into both eyes at bedtime. Patient not taking: Reported on 10/21/2014 08/10/14   Tresa Garter, MD  ibuprofen (ADVIL,MOTRIN) 800 MG tablet Take 1 tablet (800 mg total) by mouth every 8 (eight) hours as needed for mild pain. Patient not taking: Reported  on 10/21/2014 08/10/14   Tresa Garter, MD  loratadine (CLARITIN) 10 MG tablet Take 10 mg by mouth daily.    Historical Provider, MD  OVER THE COUNTER MEDICATION Take 1 capsule by mouth 3 (three) times daily with meals. Blue/green algae    Historical Provider, MD     Objective:   Filed Vitals:   10/21/14 1527  BP: 153/83  Pulse: 75  Temp: 97.8 F (36.6 C)  TempSrc: Oral  Resp: 18  Height: 6\' 2"  (1.88 m)  Weight: 193 lb (87.544 kg)  SpO2: 97%     Exam General appearance : Awake, alert, not in any distress. Speech Clear. Not toxic looking HEENT: Atraumatic and Normocephalic, pupils equally reactive to light and accomodation Neck: supple, no JVD. No cervical lymphadenopathy.  Chest:Good air entry bilaterally, no added sounds  CVS: S1 S2 regular, no murmurs.  Abdomen: Bowel sounds present, Non tender and not distended with no gaurding, rigidity or rebound. Extremities: B/L Lower Ext shows no edema, both legs are warm to touch Neurology: Awake alert, and oriented X 3, CN II-XII intact, Non focal Skin: Maculopapular rashes on extensor surfaces of upper and lower limb and some part of trunk. Data Review Lab Results  Component Value Date   HGBA1C 5.6 08/10/2014     Assessment & Plan   1. Eczema  - clobetasol cream (TEMOVATE) 0.05 %; Apply 1 application topically 2 (two) times daily.  Dispense: 60 g; Refill: 3  2. Essential hypertension  - Basic Metabolic Panel  3. Vasovagal syncope: Patient has negative orthostatic vitals  - 2D Echocardiogram with contrast; Future - US Carotid Duplex Bilateral; Future  Patient was counseled extensively about nutrition and exercise. Return in about 3 months (around 01/21/2015) for Follow up HTN, Annual Physical.  The patient was given clear instructions to go to ER or return to medical center if symptoms don't improve, worsen or new problems develop. The patient verbalized understanding. The patient was told to call to get lab results if they haven't heard anything in the next week.   This note has been created with Surveyor, quantity. Any transcriptional errors are unintentional.    Angelica Chessman, MD, [place], [place], [place] and [place], [place]   10/21/2014, 4:05 PM

## 2014-10-22 ENCOUNTER — Telehealth: Payer: Self-pay

## 2014-10-22 ENCOUNTER — Other Ambulatory Visit (HOSPITAL_COMMUNITY): Payer: Self-pay | Admitting: Internal Medicine

## 2014-10-22 DIAGNOSIS — R55 Syncope and collapse: Secondary | ICD-10-CM

## 2014-10-22 NOTE — Telephone Encounter (Signed)
Dr. Doreene Burke ordered 2D echocardiogram with contrast and carotid duplex bilateral for patient for vasovagal syncope. Was informed by Vascular that prior authorization may be needed.  Call placed to patient's insurance [place] [place] [phone]) and was informed that prior authorization not needed for this imaging.  Call placed to [facility] to provide update and schedule echocardiogram and carotid duplex bilateral.  Patient scheduled for carotid duplex bilateral on 10/26/14 at 1400 and echocardiogram on 10/26/14 at 1500.  Call placed to patient to inform; unable to reach patient. Left voicemail requesting return call.  Awaiting call back.  Also placed call to emergency contact, Warrick Parisian; however, unable to reach patient at this number or leave voicemail as voicemail box full and unable to receive messages.

## 2014-10-22 NOTE — Telephone Encounter (Signed)
Call placed once again to patient to inform that carotid duplex bilateral scheduled for 10/26/14 at 1400 and 2D echocardiogram scheduled for 10/26/14 at 1500. Unable to reach patient once again; awaiting return call from patient.

## 2014-10-25 ENCOUNTER — Telehealth: Payer: Self-pay | Admitting: Emergency Medicine

## 2014-10-25 NOTE — Telephone Encounter (Signed)
Left 2nd message with scheduled Echo/Carotid Duplex scheduled 10/26/14 @ 2pm and 4pm. Pt instructed to arrived @ 145 pm and call nurse line when message received

## 2014-10-26 ENCOUNTER — Ambulatory Visit (HOSPITAL_COMMUNITY): Payer: BLUE CROSS/BLUE SHIELD

## 2014-10-29 ENCOUNTER — Telehealth: Payer: Self-pay | Admitting: Emergency Medicine

## 2014-10-29 NOTE — Telephone Encounter (Signed)
-----   Message from Tresa Garter, MD sent at 10/27/2014  5:27 PM EST ----- Please inform patient that his blood test is normal, potassium is now within normal limit

## 2014-10-29 NOTE — Telephone Encounter (Signed)
Left message with test results.

## 2014-11-04 ENCOUNTER — Ambulatory Visit (HOSPITAL_COMMUNITY): Payer: BLUE CROSS/BLUE SHIELD

## 2014-11-04 ENCOUNTER — Ambulatory Visit (HOSPITAL_COMMUNITY): Admission: RE | Admit: 2014-11-04 | Payer: BLUE CROSS/BLUE SHIELD | Source: Ambulatory Visit

## 2014-11-05 ENCOUNTER — Other Ambulatory Visit: Payer: Self-pay | Admitting: Emergency Medicine

## 2014-11-05 MED ORDER — CLOBETASOL PROPIONATE 0.05 % EX OINT: 1.0000 "application " | TOPICAL_OINTMENT | Freq: Two times a day (BID) | CUTANEOUS | Status: DC

## 2014-11-09 ENCOUNTER — Ambulatory Visit (HOSPITAL_COMMUNITY)
Admission: RE | Admit: 2014-11-09 | Discharge: 2014-11-09 | Disposition: A | Discharge status: Home / Self Care | Payer: BLUE CROSS/BLUE SHIELD | Source: Ambulatory Visit | Attending: Internal Medicine | Admitting: Internal Medicine

## 2014-11-09 DIAGNOSIS — R55 Syncope and collapse: Secondary | ICD-10-CM

## 2014-11-09 NOTE — Progress Notes (Signed)
VASCULAR LAB PRELIMINARY  PRELIMINARY  PRELIMINARY  PRELIMINARY  Carotid duplex completed.    Preliminary report:  Bilateral:  1-39% ICA stenosis.  Vertebral artery flow is antegrade.     Avenell Sellers, [place], RVS 11/09/2014, 3:03 PM

## 2014-11-09 NOTE — Progress Notes (Signed)
  Echocardiogram 2D Echocardiogram has been performed.  Stephen Whitaker M 11/09/2014, 2:28 PM

## 2014-11-13 ENCOUNTER — Emergency Department (INDEPENDENT_AMBULATORY_CARE_PROVIDER_SITE_OTHER)
Admission: EM | Admit: 2014-11-13 | Discharge: 2014-11-13 | Disposition: A | Discharge status: Home / Self Care | Payer: BLUE CROSS/BLUE SHIELD | Source: Home / Self Care | Attending: Emergency Medicine | Admitting: Emergency Medicine

## 2014-11-13 ENCOUNTER — Encounter (HOSPITAL_COMMUNITY): Payer: Self-pay

## 2014-11-13 ENCOUNTER — Emergency Department (INDEPENDENT_AMBULATORY_CARE_PROVIDER_SITE_OTHER): Payer: BLUE CROSS/BLUE SHIELD

## 2014-11-13 DIAGNOSIS — J189 Pneumonia, unspecified organism: Secondary | ICD-10-CM | POA: Diagnosis not present

## 2014-11-13 MED ORDER — PREDNISONE 50 MG PO TABS: ORAL_TABLET | ORAL | Status: DC

## 2014-11-13 MED ORDER — LEVOFLOXACIN 500 MG PO TABS: 500.0000 mg | ORAL_TABLET | Freq: Every day | ORAL | Status: DC

## 2014-11-13 NOTE — ED Notes (Signed)
C/o no energy, fatigue since Friday. NAD

## 2014-11-13 NOTE — ED Provider Notes (Signed)
CSN: [id number]     Arrival date & time 11/13/14  1121 History   First MD Initiated Contact with Patient 11/13/14 1213     Chief Complaint  Patient presents with  . Cough   (Consider location/radiation/quality/duration/timing/severity/associated sxs/prior Treatment) HPI He is a 59 year old man here for evaluation of decreased energy. He states yesterday he developed subjective fever, decreased energy, feeling short of breath, productive cough. He also reports some nasal discharge. He describes some hot and cold chills. He had one episode of nausea with vomiting yesterday. No abdominal pain or diarrhea. No known sick contacts. He states he did have one day last week where he had no energy, but this resolved until yesterday.  Past Medical History  Diagnosis Date  . Hypertension   . Pancreatitis, acute    Past Surgical History  Procedure Laterality Date  . Tonsillectomy    . Amputation Left 07/14/2014    Procedure: 2nd Toe Amputation;  Surgeon: [name], MD;  Location: [facility];  Service: Orthopedics;  Laterality: Left;   Family History  Problem Relation Age of Onset  . Adopted: Yes   History  Substance Use Topics  . Smoking status: Never Smoker   . Smokeless tobacco: Not on file  . Alcohol Use: Yes    Review of Systems  Constitutional: Positive for fever (subjective), chills, activity change and fatigue.  HENT: Positive for congestion and rhinorrhea. Negative for sore throat.   Respiratory: Positive for cough and shortness of breath.   Cardiovascular: Negative for chest pain.  Gastrointestinal: Positive for nausea and vomiting. Negative for diarrhea.  Musculoskeletal: Negative for myalgias.  Neurological: Negative for headaches.    Allergies  Shellfish allergy  Home Medications   Prior to Admission medications   Medication Sig Start Date End Date Taking? Authorizing Provider  albuterol (PROVENTIL HFA;VENTOLIN HFA) 108 (90 BASE) MCG/ACT inhaler Inhale 2 puffs into the  lungs every 6 (six) hours as needed for wheezing or shortness of breath.    Historical Provider, MD  allopurinol (ZYLOPRIM) 100 MG tablet  07/19/14   Historical Provider, MD  Bioflavonoid Products (ESTER C PO) Take 1 tablet by mouth daily.    Historical Provider, MD  clobetasol ointment (TEMOVATE) 0.73 % Apply 1 application topically 2 (two) times daily. 11/05/14   Tresa Garter, MD  Digestive Enzymes (DIGESTIVE ENZYME PO) Take 1 capsule by mouth 3 (three) times daily with meals. [place] brand    Historical Provider, MD  Fluticasone-Salmeterol (ADVAIR) 500-50 MCG/DOSE AEPB Inhale 1 puff into the lungs 2 (two) times daily. 10/21/14   Tresa Garter, MD  hydrochlorothiazide (HYDRODIURIL) 25 MG tablet Take 1 tablet (25 mg total) by mouth daily. 08/10/14   Tresa Garter, MD  ibuprofen (ADVIL,MOTRIN) 800 MG tablet Take 1 tablet (800 mg total) by mouth every 8 (eight) hours as needed for mild pain. Patient not taking: Reported on 10/21/2014 08/10/14   Tresa Garter, MD  levofloxacin (LEVAQUIN) 500 MG tablet Take 1 tablet (500 mg total) by mouth daily. 11/13/14   Melony Overly, MD  loratadine (CLARITIN) 10 MG tablet Take 10 mg by mouth daily.    Historical Provider, MD  Multiple Vitamin (MULTIVITAMIN WITH MINERALS) TABS tablet Take 1 tablet by mouth daily.    Historical Provider, MD  OVER THE COUNTER MEDICATION Take 1 capsule by mouth 3 (three) times daily with meals. Blue/green algae    Historical Provider, MD  Pancrelipase, Lip-Prot-Amyl, (CREON) 24000 UNITS CPEP Take 1 capsule (24,000 Units total)  by mouth daily. 08/10/14   Tresa Garter, MD  pantoprazole (PROTONIX) 40 MG tablet Take 1 tablet (40 mg total) by mouth daily. 08/10/14   Tresa Garter, MD  predniSONE (DELTASONE) 50 MG tablet Take 1 pill daily for 5 days. 11/13/14   Melony Overly, MD   BP 124/82 mmHg  Pulse 101  Temp(Src) 98.8 F (37.1 C) (Oral)  Resp 20  SpO2 97% Physical Exam  Constitutional: He is oriented to  person, place, and time. He appears well-developed and well-nourished. No distress.  HENT:  Head: Normocephalic and atraumatic.  Right Ear: Tympanic membrane normal.  Nose: Rhinorrhea present. No mucosal edema.  Mouth/Throat: Posterior oropharyngeal erythema present. No oropharyngeal exudate.  Eyes: Conjunctivae are normal.  Neck: Neck supple.  Cardiovascular: Normal rate, regular rhythm and normal heart sounds.   No murmur heard. Pulmonary/Chest: Effort normal and breath sounds normal. No respiratory distress. He has no wheezes. He has no rales.  Lymphadenopathy:    He has no cervical adenopathy.  Neurological: He is alert and oriented to person, place, and time.    ED Course  Procedures (including critical care time) Labs Review Labs Reviewed - No data to display  Imaging Review Dg Chest 2 View  11/13/2014   CLINICAL DATA:  Cough and fever for 2 days.  EXAM: CHEST  2 VIEW  COMPARISON:  09/16/2003  FINDINGS: Cardiomediastinal silhouette is stable. There is streaky atelectasis or early infiltrate in lingula. No pulmonary edema. Bony thorax is unremarkable.  IMPRESSION: Streaky atelectasis or infiltrate in lingula.  No pulmonary edema.   Electronically Signed   By: Lahoma Crocker M.D.   On: 11/13/2014 12:47     MDM   1. CAP (community acquired pneumonia)    X-ray concerning for pneumonia. We'll treat with Levaquin and prednisone. Return precautions reviewed.    Melony Overly, MD 11/13/14 1259

## 2014-11-13 NOTE — Discharge Instructions (Signed)
You have pneumonia. Take Levaquin and prednisone as prescribed. You should feel better in the next 1-2 days. If you are having persistent fevers, having trouble breathing, or develop vomiting please go to the emergency room.

## 2014-12-30 ENCOUNTER — Encounter (HOSPITAL_COMMUNITY): Payer: Self-pay | Admitting: Emergency Medicine

## 2014-12-30 ENCOUNTER — Emergency Department (INDEPENDENT_AMBULATORY_CARE_PROVIDER_SITE_OTHER)
Admission: EM | Admit: 2014-12-30 | Discharge: 2014-12-30 | Disposition: A | Discharge status: Home / Self Care | Payer: BLUE CROSS/BLUE SHIELD | Source: Home / Self Care | Attending: Family Medicine | Admitting: Family Medicine

## 2014-12-30 DIAGNOSIS — L97529 Non-pressure chronic ulcer of other part of left foot with unspecified severity: Secondary | ICD-10-CM

## 2014-12-30 MED ORDER — SULFAMETHOXAZOLE-TRIMETHOPRIM 800-160 MG PO TABS: 2.0000 | ORAL_TABLET | Freq: Two times a day (BID) | ORAL | Status: DC

## 2014-12-30 NOTE — ED Provider Notes (Signed)
Stephen Whitaker is a 59 y.o. male who presents to Urgent Care today for ulceration plantar surface left first toe. Symptoms present for months. Patient removed a blister and notes a small hole on the bottom of his toe. It is not particularly tender. No fevers or chills nausea vomiting or diarrhea. He has an appointment coming up with his orthopedic surgeon soon.   Past Medical History  Diagnosis Date  . Hypertension   . Pancreatitis, acute    Past Surgical History  Procedure Laterality Date  . Tonsillectomy    . Amputation Left 07/14/2014    Procedure: 2nd Toe Amputation;  Surgeon: [name], MD;  Location: [facility];  Service: Orthopedics;  Laterality: Left;   History  Substance Use Topics  . Smoking status: Never Smoker   . Smokeless tobacco: Not on file  . Alcohol Use: Yes   ROS as above Medications: No current facility-administered medications for this encounter.   Current Outpatient Prescriptions  Medication Sig Dispense Refill  . albuterol (PROVENTIL HFA;VENTOLIN HFA) 108 (90 BASE) MCG/ACT inhaler Inhale 2 puffs into the lungs every 6 (six) hours as needed for wheezing or shortness of breath.    . allopurinol (ZYLOPRIM) 100 MG tablet     . Bioflavonoid Products (ESTER C PO) Take 1 tablet by mouth daily.    . clobetasol ointment (TEMOVATE) 0.93 % Apply 1 application topically 2 (two) times daily. 30 g 0  . Digestive Enzymes (DIGESTIVE ENZYME PO) Take 1 capsule by mouth 3 (three) times daily with meals. [place] brand    . Fluticasone-Salmeterol (ADVAIR) 500-50 MCG/DOSE AEPB Inhale 1 puff into the lungs 2 (two) times daily. 60 each 3  . hydrochlorothiazide (HYDRODIURIL) 25 MG tablet Take 1 tablet (25 mg total) by mouth daily. 30 tablet 3  . ibuprofen (ADVIL,MOTRIN) 800 MG tablet Take 1 tablet (800 mg total) by mouth every 8 (eight) hours as needed for mild pain. (Patient not taking: Reported on 10/21/2014) 60 tablet 1  . levofloxacin (LEVAQUIN) 500 MG tablet Take 1 tablet (500 mg  total) by mouth daily. 7 tablet 0  . loratadine (CLARITIN) 10 MG tablet Take 10 mg by mouth daily.    . Multiple Vitamin (MULTIVITAMIN WITH MINERALS) TABS tablet Take 1 tablet by mouth daily.    Marland Kitchen OVER THE COUNTER MEDICATION Take 1 capsule by mouth 3 (three) times daily with meals. Blue/green algae    . Pancrelipase, Lip-Prot-Amyl, (CREON) 24000 UNITS CPEP Take 1 capsule (24,000 Units total) by mouth daily. 180 capsule 3  . pantoprazole (PROTONIX) 40 MG tablet Take 1 tablet (40 mg total) by mouth daily. 30 tablet 3  . predniSONE (DELTASONE) 50 MG tablet Take 1 pill daily for 5 days. 5 tablet 0  . sulfamethoxazole-trimethoprim (BACTRIM DS,SEPTRA DS) 800-160 MG per tablet Take 2 tablets by mouth 2 (two) times daily. 28 tablet 0  . [DISCONTINUED] COLCRYS 0.6 MG tablet     . [DISCONTINUED] fluticasone (FLONASE) 50 MCG/ACT nasal spray Place 1 spray into both nostrils daily. 16 g 1   Allergies  Allergen Reactions  . Shellfish Allergy Swelling     Exam:  BP 122/82 mmHg  Pulse 102  Temp(Src) 99.3 F (37.4 C) (Oral)  Resp 20  SpO2 96% Gen: Well NAD Left foot and ankle: Significant pronation and subluxation of his ankle medially. He has a unstageable ulceration on the plantar aspect of his great toe. Nontender no skin erythema. Pulses and sensation intact in the foot.  No results found for  this or any previous visit (from the past 24 hour(s)). No results found.  Assessment and Plan: 59 y.o. male with total ulceration due to abnormal gait. Follow-up with Dr. Sharol Given treat with Bactrim return as needed  Discussed warning signs or symptoms. Please see discharge instructions. Patient expresses understanding.     Gregor Hams, MD 12/30/14 [phone]

## 2014-12-30 NOTE — Discharge Instructions (Signed)
Thank you for coming in today. Consider steel insoles to keep pressure off your toe.  Turf Toe Full [employer]  Item #: [id number]  How do I place an order online? Each one of our product pages contains an option to Add to [facility]. Click on as many items as youd like and each will be added to your shopping cart. For some items, you will need to select the appropriate, color, size or width prior to clicking on the Add to Cart button. When you are done shopping, select Shopping Cart on the header bar at the top of the page. You can change the quantity or delete items when you are in the [employer].  Once youve reviewed your items and quantity click the Checkout button. The next pages will ask for your shipping, billing and payment information. You will then be able to view your order details before selection Send Order. Your order will not be processed until you click the Send Order button.  You will know that your order has gone through if you receive an immediate e-mail confirmation. If you do not receive a confirming email, notify us at [email or web address] or call toll free at [phone]. Also, please contact us when a change has been made to a previous email address used so that we may update our entire system. Phone orders are also welcome. Our Customer Service Representatives are available to help you Monday through Friday 9:00 a.m. EST - 5:30 p.m. EST.  Do I Have To Order Online? Can I Fax Or Call In My Order? You can place an order with [place] two different ways:  Online through our secure site By Calling and placing an order with a live Customer Service Representative, Monday through Friday - 9:00 a.m. EST - 5:30 p.m. EST. We do not accept fax orders. - See more at: [email or web address]   Consider follow up with Dr. Darene Lamer for insoles.  Return as needed.     Pressure Ulcer A pressure ulcer is a sore that has  formed from the breakdown of skin and exposure of deeper layers of tissue. It develops in areas of the body where there is unrelieved pressure. Pressure ulcers are usually found over a bony area, such as the shoulder blades, spine, lower back, hips, knees, ankles, and heels. Pressure ulcers vary in severity. Your health care provider may determine the severity (stage) of your pressure ulcer. The stages include:  Stage I--The skin is red, and when the skin is pressed, it stays red.  Stage II--The top layer of skin is gone, and there is a shallow, pink ulcer.  Stage III--The ulcer becomes deeper, and it is more difficult to see the whole wound. Also, there may be yellow or brown parts, as well as pink and red parts.  Stage IV--The ulcer may be deep and red, pink, brown, white, or yellow. Bone or muscle may be seen.  Unstageable pressure ulcer--The ulcer is covered almost completely with black, brown, or yellow tissue. It is not known how deep the ulcer is or what stage it is until this covering comes off.  Suspected deep tissue injury--A person's skin can be injured from pressure or pulling on the skin when his or her position is changed. The skin appears purple or maroon. There may not be an opening in the skin, but there could be a blood-filled blister. This deep tissue injury is often difficult to see in people with darker skin tones. The site may open and become deeper in  time. However, early interventions will help the area heal and may prevent the area from opening. CAUSES  Pressure ulcers are caused by pressure against the skin that limits the flow of blood to the skin and nearby tissues. There are many risk factors that can lead to pressure sores. RISK FACTORS  Decreased ability to move.  Decreased ability to feel pain or discomfort.  Excessive skin moisture from urine, stool, sweat, or secretions.  Poor nutrition.  Dehydration.  Tobacco, drug, or alcohol abuse.  Having someone  pull on bedsheets that are under you, such as when health care workers are changing your position in a hospital bed.  Obesity.  Increased adult age.  Hospitalization in a critical care unit for longer than 4 days with use of medical devices.  Prolonged use of medical devices.  Critical illness.  Anemia.  Traumatic brain injury.  Spinal cord injury.  Stroke.  Diabetes.  Poor blood glucose control.  Low blood pressure (hypotension).  Low oxygen levels.  Medicines that reduce blood flow.  Infection. DIAGNOSIS  Your health care provider will diagnose your pressure ulcer based on its appearance. The health care provider may determine the stage of your pressure ulcer as well. Tests may be done to check for infection, to assess your circulation, or to check for other diseases, such as diabetes. TREATMENT  Treatment of your pressure ulcer begins with determining what stage the ulcer is in. Your treatment team may include your health care provider, a wound care specialist, a nutritionist, a physical therapist, and a Psychologist, sport and exercise. Possible treatments may include:   Moving or repositioning every 1-2 hours.  Using beds or mattresses to shift your body weight and pressure points frequently.  Improving your diet.  Cleaning and bandaging (dressing) the open wound.  Giving antibiotic medicines.  Removing damaged tissue.  Surgery and sometimes skin grafts. HOME CARE INSTRUCTIONS  If you were hospitalized, follow the care plan that was started in the hospital.  Avoid staying in the same position for more than 2 hours. Use padding, devices, or mattresses to cushion your pressure points as directed by your health care provider.  Eat a well-balanced diet. Take nutritional supplements and vitamins as directed by your health care provider.  Keep all follow-up appointments.  Only take over-the-counter or prescription medicines for pain, fever, or discomfort as directed by your health  care provider. SEEK MEDICAL CARE IF:   Your pressure ulcer is not improving.  You do not know how to care for your pressure ulcer.  You notice other areas of redness on your skin.  You have a fever. SEEK IMMEDIATE MEDICAL CARE IF:   You have increasing redness, swelling, or pain in your pressure ulcer.  You notice pus coming from your pressure ulcer.  You notice a bad smell coming from the wound or dressing.  Your pressure ulcer opens up again. Document Released: 08/06/2005 Document Revised: 08/11/2013 Document Reviewed: 04/13/2013 [facility] Patient Information 2015 [place], [place]. This information is not intended to replace advice given to you by your health care provider. Make sure you discuss any questions you have with your health care provider.

## 2014-12-30 NOTE — ED Notes (Signed)
C/o a blister on left big toe for 3-4 months States he does have hx of blisters on toes

## 2015-01-21 ENCOUNTER — Inpatient Hospital Stay (HOSPITAL_COMMUNITY): Payer: BLUE CROSS/BLUE SHIELD

## 2015-01-21 ENCOUNTER — Inpatient Hospital Stay (HOSPITAL_COMMUNITY)
Admission: EM | Admit: 2015-01-21 | Discharge: 2015-01-24 | DRG: 871 | Disposition: A | Discharge status: Home / Self Care | Payer: BLUE CROSS/BLUE SHIELD | Attending: Internal Medicine | Admitting: Internal Medicine

## 2015-01-21 ENCOUNTER — Encounter (HOSPITAL_COMMUNITY): Payer: Self-pay

## 2015-01-21 ENCOUNTER — Inpatient Hospital Stay (HOSPITAL_COMMUNITY)
Admit: 2015-01-21 | Discharge: 2015-01-21 | Disposition: A | Discharge status: Home / Self Care | Payer: BLUE CROSS/BLUE SHIELD | Attending: Internal Medicine | Admitting: Internal Medicine

## 2015-01-21 ENCOUNTER — Emergency Department (HOSPITAL_COMMUNITY): Payer: BLUE CROSS/BLUE SHIELD

## 2015-01-21 ENCOUNTER — Other Ambulatory Visit: Payer: Self-pay

## 2015-01-21 DIAGNOSIS — Z91013 Allergy to seafood: Secondary | ICD-10-CM | POA: Diagnosis not present

## 2015-01-21 DIAGNOSIS — E611 Iron deficiency: Secondary | ICD-10-CM | POA: Diagnosis present

## 2015-01-21 DIAGNOSIS — I10 Essential (primary) hypertension: Secondary | ICD-10-CM | POA: Diagnosis present

## 2015-01-21 DIAGNOSIS — D709 Neutropenia, unspecified: Secondary | ICD-10-CM | POA: Insufficient documentation

## 2015-01-21 DIAGNOSIS — I7300 Raynaud's syndrome without gangrene: Secondary | ICD-10-CM | POA: Diagnosis not present

## 2015-01-21 DIAGNOSIS — D500 Iron deficiency anemia secondary to blood loss (chronic): Secondary | ICD-10-CM | POA: Insufficient documentation

## 2015-01-21 DIAGNOSIS — E872 Acidosis: Secondary | ICD-10-CM | POA: Diagnosis present

## 2015-01-21 DIAGNOSIS — E860 Dehydration: Secondary | ICD-10-CM | POA: Diagnosis present

## 2015-01-21 DIAGNOSIS — R197 Diarrhea, unspecified: Secondary | ICD-10-CM | POA: Diagnosis present

## 2015-01-21 DIAGNOSIS — Z791 Long term (current) use of non-steroidal anti-inflammatories (NSAID): Secondary | ICD-10-CM

## 2015-01-21 DIAGNOSIS — L89899 Pressure ulcer of other site, unspecified stage: Secondary | ICD-10-CM | POA: Diagnosis present

## 2015-01-21 DIAGNOSIS — L97519 Non-pressure chronic ulcer of other part of right foot with unspecified severity: Secondary | ICD-10-CM | POA: Diagnosis not present

## 2015-01-21 DIAGNOSIS — R404 Transient alteration of awareness: Secondary | ICD-10-CM

## 2015-01-21 DIAGNOSIS — D72819 Decreased white blood cell count, unspecified: Secondary | ICD-10-CM | POA: Diagnosis present

## 2015-01-21 DIAGNOSIS — L03032 Cellulitis of left toe: Secondary | ICD-10-CM | POA: Diagnosis present

## 2015-01-21 DIAGNOSIS — R55 Syncope and collapse: Secondary | ICD-10-CM | POA: Diagnosis present

## 2015-01-21 DIAGNOSIS — E11621 Type 2 diabetes mellitus with foot ulcer: Secondary | ICD-10-CM | POA: Diagnosis present

## 2015-01-21 DIAGNOSIS — D61811 Other drug-induced pancytopenia: Secondary | ICD-10-CM

## 2015-01-21 DIAGNOSIS — J449 Chronic obstructive pulmonary disease, unspecified: Secondary | ICD-10-CM | POA: Diagnosis present

## 2015-01-21 DIAGNOSIS — A401 Sepsis due to streptococcus, group B: Secondary | ICD-10-CM | POA: Diagnosis not present

## 2015-01-21 DIAGNOSIS — E876 Hypokalemia: Secondary | ICD-10-CM | POA: Diagnosis present

## 2015-01-21 DIAGNOSIS — K521 Toxic gastroenteritis and colitis: Secondary | ICD-10-CM | POA: Diagnosis present

## 2015-01-21 DIAGNOSIS — D61818 Other pancytopenia: Secondary | ICD-10-CM | POA: Diagnosis present

## 2015-01-21 DIAGNOSIS — A419 Sepsis, unspecified organism: Secondary | ICD-10-CM | POA: Diagnosis not present

## 2015-01-21 DIAGNOSIS — R531 Weakness: Secondary | ICD-10-CM | POA: Diagnosis not present

## 2015-01-21 DIAGNOSIS — D649 Anemia, unspecified: Secondary | ICD-10-CM | POA: Diagnosis present

## 2015-01-21 DIAGNOSIS — Z89429 Acquired absence of other toe(s), unspecified side: Secondary | ICD-10-CM | POA: Diagnosis not present

## 2015-01-21 DIAGNOSIS — L97529 Non-pressure chronic ulcer of other part of left foot with unspecified severity: Secondary | ICD-10-CM

## 2015-01-21 DIAGNOSIS — R5081 Fever presenting with conditions classified elsewhere: Secondary | ICD-10-CM | POA: Diagnosis present

## 2015-01-21 DIAGNOSIS — R4020 Unspecified coma: Secondary | ICD-10-CM | POA: Insufficient documentation

## 2015-01-21 DIAGNOSIS — R109 Unspecified abdominal pain: Secondary | ICD-10-CM

## 2015-01-21 DIAGNOSIS — M869 Osteomyelitis, unspecified: Secondary | ICD-10-CM | POA: Diagnosis present

## 2015-01-21 DIAGNOSIS — K859 Acute pancreatitis, unspecified: Secondary | ICD-10-CM | POA: Diagnosis present

## 2015-01-21 DIAGNOSIS — K861 Other chronic pancreatitis: Secondary | ICD-10-CM | POA: Diagnosis present

## 2015-01-21 DIAGNOSIS — Z79899 Other long term (current) drug therapy: Secondary | ICD-10-CM | POA: Diagnosis not present

## 2015-01-21 DIAGNOSIS — IMO0001 Reserved for inherently not codable concepts without codable children: Secondary | ICD-10-CM | POA: Insufficient documentation

## 2015-01-21 DIAGNOSIS — T3695XA Adverse effect of unspecified systemic antibiotic, initial encounter: Secondary | ICD-10-CM | POA: Diagnosis present

## 2015-01-21 DIAGNOSIS — L97509 Non-pressure chronic ulcer of other part of unspecified foot with unspecified severity: Secondary | ICD-10-CM | POA: Diagnosis present

## 2015-01-21 DIAGNOSIS — L8990 Pressure ulcer of unspecified site, unspecified stage: Secondary | ICD-10-CM | POA: Diagnosis present

## 2015-01-21 LAB — CBC WITH DIFFERENTIAL/PLATELET
BAND NEUTROPHILS: 0 % (ref 0–10)
Basophils Absolute: 0 10*3/uL (ref 0.0–0.1)
Basophils Absolute: 0 10*3/uL (ref 0.0–0.1)
Basophils Relative: 0 % (ref 0–1)
Basophils Relative: 0 % (ref 0–1)
Blasts: 0 %
EOS ABS: 0 10*3/uL (ref 0.0–0.7)
EOS PCT: 0 % (ref 0–5)
Eosinophils Absolute: 0 10*3/uL (ref 0.0–0.7)
Eosinophils Relative: 0 % (ref 0–5)
HCT: 28 % — ABNORMAL LOW (ref 39.0–52.0)
HCT: 31 % — ABNORMAL LOW (ref 39.0–52.0)
HEMOGLOBIN: 10 g/dL — AB (ref 13.0–17.0)
Hemoglobin: 9.3 g/dL — ABNORMAL LOW (ref 13.0–17.0)
Lymphocytes Relative: 60 % — ABNORMAL HIGH (ref 12–46)
Lymphocytes Relative: 54 % — ABNORMAL HIGH (ref 12–46)
Lymphs Abs: 0.5 10*3/uL — ABNORMAL LOW (ref 0.7–4.0)
Lymphs Abs: 0.5 10*3/uL — ABNORMAL LOW (ref 0.7–4.0)
MCH: 26.6 pg (ref 26.0–34.0)
MCH: 26.1 pg (ref 26.0–34.0)
MCHC: 33.2 g/dL (ref 30.0–36.0)
MCHC: 32.3 g/dL (ref 30.0–36.0)
MCV: 80.2 fL (ref 78.0–100.0)
MCV: 80.9 fL (ref 78.0–100.0)
METAMYELOCYTES PCT: 0 %
MONO ABS: 0.3 10*3/uL (ref 0.1–1.0)
MYELOCYTES: 0 %
Monocytes Absolute: 0.3 10*3/uL (ref 0.1–1.0)
Monocytes Relative: 40 % — ABNORMAL HIGH (ref 3–12)
Monocytes Relative: 33 % — ABNORMAL HIGH (ref 3–12)
NEUTROS ABS: 0 10*3/uL — AB (ref 1.7–7.7)
Neutro Abs: 0.1 10*3/uL — ABNORMAL LOW (ref 1.7–7.7)
Neutrophils Relative %: 0 % — ABNORMAL LOW (ref 43–77)
Neutrophils Relative %: 13 % — ABNORMAL LOW (ref 43–77)
Other: 0 %
Platelets: 286 10*3/uL (ref 150–400)
Platelets: ADEQUATE 10*3/uL (ref 150–400)
Promyelocytes Absolute: 0 %
RBC: 3.49 MIL/uL — AB (ref 4.22–5.81)
RBC: 3.83 MIL/uL — AB (ref 4.22–5.81)
RDW: 13.9 % (ref 11.5–15.5)
RDW: 13.8 % (ref 11.5–15.5)
WBC: 0.8 10*3/uL — AB (ref 4.0–10.5)
WBC: 0.9 10*3/uL — CL (ref 4.0–10.5)
nRBC: 0 /100 WBC

## 2015-01-21 LAB — BASIC METABOLIC PANEL
Anion gap: 5 (ref 5–15)
BUN: 26 mg/dL — AB (ref 6–20)
CO2: 25 mmol/L (ref 22–32)
Calcium: 7.1 mg/dL — ABNORMAL LOW (ref 8.9–10.3)
Chloride: 103 mmol/L (ref 101–111)
Creatinine, Ser: 1.17 mg/dL (ref 0.61–1.24)
GFR calc Af Amer: >60 mL/min (ref >60)
Glucose, Bld: 112 mg/dL — ABNORMAL HIGH (ref 65–99)
Potassium: 3 mmol/L — ABNORMAL LOW (ref 3.5–5.1)
Sodium: 133 mmol/L — ABNORMAL LOW (ref 135–145)

## 2015-01-21 LAB — LIPASE, BLOOD
Lipase: 23 U/L (ref 22–51)
Lipase: 115 U/L — ABNORMAL HIGH (ref 22–51)

## 2015-01-21 LAB — CORTISOL: Cortisol, Plasma: 15.4 ug/dL

## 2015-01-21 LAB — I-STAT CG4 LACTIC ACID, ED: Lactic Acid, Venous: 2.76 mmol/L (ref 0.5–2.0)

## 2015-01-21 LAB — COMPREHENSIVE METABOLIC PANEL
ALT: 73 U/L — ABNORMAL HIGH (ref 17–63)
AST: 49 U/L — ABNORMAL HIGH (ref 15–41)
Albumin: 2.8 g/dL — ABNORMAL LOW (ref 3.5–5.0)
Alkaline Phosphatase: 44 U/L (ref 38–126)
Anion gap: 10 (ref 5–15)
BILIRUBIN TOTAL: 1.1 mg/dL (ref 0.3–1.2)
BUN: 30 mg/dL — ABNORMAL HIGH (ref 6–20)
CALCIUM: 7.4 mg/dL — AB (ref 8.9–10.3)
CHLORIDE: 101 mmol/L (ref 101–111)
CO2: 22 mmol/L (ref 22–32)
Creatinine, Ser: 1.24 mg/dL (ref 0.61–1.24)
GFR calc Af Amer: >60 mL/min (ref >60)
GFR calc non Af Amer: >60 mL/min (ref >60)
Glucose, Bld: 107 mg/dL — ABNORMAL HIGH (ref 65–99)
Potassium: 2.8 mmol/L — ABNORMAL LOW (ref 3.5–5.1)
Sodium: 133 mmol/L — ABNORMAL LOW (ref 135–145)
TOTAL PROTEIN: 6.6 g/dL (ref 6.5–8.1)

## 2015-01-21 LAB — HIV ANTIBODY (ROUTINE TESTING W REFLEX): HIV SCREEN 4TH GENERATION: NONREACTIVE

## 2015-01-21 LAB — URINALYSIS, ROUTINE W REFLEX MICROSCOPIC
Bilirubin Urine: NEGATIVE
Glucose, UA: NEGATIVE mg/dL
KETONES UR: NEGATIVE mg/dL
Leukocytes, UA: NEGATIVE
NITRITE: NEGATIVE
PROTEIN: 100 mg/dL — AB
Specific Gravity, Urine: 1.019 (ref 1.005–1.030)
UROBILINOGEN UA: 1 mg/dL (ref 0.0–1.0)
pH: 6 (ref 5.0–8.0)

## 2015-01-21 LAB — URINE MICROSCOPIC-ADD ON

## 2015-01-21 LAB — RAPID HIV SCREEN (HIV 1/2 AB+AG)
HIV 1/2 Antibodies: NONREACTIVE
HIV-1 P24 Antigen - HIV24: NONREACTIVE

## 2015-01-21 LAB — PROTIME-INR
INR: 1.61 — ABNORMAL HIGH (ref 0.00–1.49)
Prothrombin Time: 19.2 seconds — ABNORMAL HIGH (ref 11.6–15.2)

## 2015-01-21 LAB — IRON AND TIBC
Iron: 13 ug/dL — ABNORMAL LOW (ref 45–182)
Saturation Ratios: 7 % — ABNORMAL LOW (ref 17.9–39.5)
TIBC: 181 ug/dL — ABNORMAL LOW (ref 250–450)
UIBC: 168 ug/dL

## 2015-01-21 LAB — TYPE AND SCREEN
ABO/RH(D): O POS
Antibody Screen: NEGATIVE

## 2015-01-21 LAB — VITAMIN B12: Vitamin B-12: 915 pg/mL — ABNORMAL HIGH (ref 180–914)

## 2015-01-21 LAB — LACTATE DEHYDROGENASE: LDH: 178 U/L (ref 98–192)

## 2015-01-21 LAB — MAGNESIUM: MAGNESIUM: 1.8 mg/dL (ref 1.7–2.4)

## 2015-01-21 LAB — TROPONIN I: Troponin I: <0.03 ng/mL (ref <0.031)

## 2015-01-21 LAB — FERRITIN: FERRITIN: 577 ng/mL — AB (ref 24–336)

## 2015-01-21 LAB — SEDIMENTATION RATE: Sed Rate: 80 mm/hr — ABNORMAL HIGH (ref 0–16)

## 2015-01-21 LAB — SAVE SMEAR

## 2015-01-21 LAB — PATHOLOGIST SMEAR REVIEW

## 2015-01-21 LAB — PROCALCITONIN: Procalcitonin: 4.17 ng/mL

## 2015-01-21 LAB — TSH: TSH: 0.633 u[IU]/mL (ref 0.350–4.500)

## 2015-01-21 LAB — OCCULT BLOOD X 1 CARD TO LAB, STOOL: Fecal Occult Bld: NEGATIVE

## 2015-01-21 LAB — LACTIC ACID, PLASMA: LACTIC ACID, VENOUS: 0.8 mmol/L (ref 0.5–2.0)

## 2015-01-21 LAB — ABO/RH: ABO/RH(D): O POS

## 2015-01-21 MED ORDER — SODIUM CHLORIDE 0.9 % IV BOLUS (SEPSIS)
1000.0000 mL | Freq: Once | INTRAVENOUS | Status: AC
  Administered 2015-01-21: 1000 mL via INTRAVENOUS

## 2015-01-21 MED ORDER — VANCOMYCIN HCL IN DEXTROSE 1-5 GM/200ML-% IV SOLN
1000.0000 mg | Freq: Two times a day (BID) | INTRAVENOUS | Status: DC
  Administered 2015-01-21: 1000 mg via INTRAVENOUS
  Filled 2015-01-21: qty 200

## 2015-01-21 MED ORDER — SODIUM CHLORIDE 0.9 % IV SOLN
Freq: Once | INTRAVENOUS | Status: AC
  Administered 2015-01-21: 04:00:00 via INTRAVENOUS

## 2015-01-21 MED ORDER — ACETAMINOPHEN 650 MG RE SUPP: 650.0000 mg | Freq: Four times a day (QID) | RECTAL | Status: DC | PRN

## 2015-01-21 MED ORDER — ONDANSETRON HCL 4 MG PO TABS
4.0000 mg | ORAL_TABLET | Freq: Four times a day (QID) | ORAL | Status: DC | PRN
Start: 2015-01-21 — End: 2015-01-24

## 2015-01-21 MED ORDER — ACETAMINOPHEN 325 MG PO TABS
650.0000 mg | ORAL_TABLET | Freq: Four times a day (QID) | ORAL | Status: DC | PRN
  Administered 2015-01-21 – 2015-01-24 (×5): 650 mg via ORAL
  Filled 2015-01-21 (×5): qty 2

## 2015-01-21 MED ORDER — IOHEXOL 300 MG/ML  SOLN
100.0000 mL | Freq: Once | INTRAMUSCULAR | Status: AC | PRN
  Administered 2015-01-21: 100 mL via INTRAVENOUS

## 2015-01-21 MED ORDER — POTASSIUM CHLORIDE IN NACL 20-0.9 MEQ/L-% IV SOLN
INTRAVENOUS | Status: DC
  Administered 2015-01-21 – 2015-01-23 (×6): via INTRAVENOUS
  Filled 2015-01-21 (×9): qty 1000

## 2015-01-21 MED ORDER — VANCOMYCIN HCL IN DEXTROSE 1-5 GM/200ML-% IV SOLN
1000.0000 mg | Freq: Once | INTRAVENOUS | Status: AC
  Administered 2015-01-21: 1000 mg via INTRAVENOUS
  Filled 2015-01-21: qty 200

## 2015-01-21 MED ORDER — PANCRELIPASE (LIP-PROT-AMYL) 12000-38000 UNITS PO CPEP
24000.0000 [IU] | ORAL_CAPSULE | Freq: Every day | ORAL | Status: DC
Start: 2015-01-21 — End: 2015-01-24
  Administered 2015-01-21 – 2015-01-24 (×4): 24000 [IU] via ORAL
  Filled 2015-01-21 (×4): qty 2

## 2015-01-21 MED ORDER — ALLOPURINOL 100 MG PO TABS
100.0000 mg | ORAL_TABLET | Freq: Every day | ORAL | Status: DC
  Administered 2015-01-21 – 2015-01-24 (×4): 100 mg via ORAL
  Filled 2015-01-21 (×4): qty 1

## 2015-01-21 MED ORDER — POTASSIUM CHLORIDE 10 MEQ/100ML IV SOLN
INTRAVENOUS | Status: AC
  Filled 2015-01-21: qty 100

## 2015-01-21 MED ORDER — ALBUTEROL SULFATE (2.5 MG/3ML) 0.083% IN NEBU
3.0000 mL | INHALATION_SOLUTION | Freq: Four times a day (QID) | RESPIRATORY_TRACT | Status: DC | PRN
  Administered 2015-01-22: 3 mL via RESPIRATORY_TRACT
  Filled 2015-01-21: qty 3

## 2015-01-21 MED ORDER — IOHEXOL 300 MG/ML  SOLN: 25.0000 mL | INTRAMUSCULAR | Status: AC

## 2015-01-21 MED ORDER — FILGRASTIM 480 MCG/1.6ML IJ SOLN
480.0000 ug | Freq: Once | INTRAMUSCULAR | Status: AC
  Administered 2015-01-21: 480 ug via SUBCUTANEOUS
  Filled 2015-01-21: qty 1.6

## 2015-01-21 MED ORDER — POTASSIUM CHLORIDE 10 MEQ/100ML IV SOLN
10.0000 meq | Freq: Once | INTRAVENOUS | Status: AC
  Administered 2015-01-21: 10 meq via INTRAVENOUS
  Filled 2015-01-21: qty 100

## 2015-01-21 MED ORDER — MOMETASONE FURO-FORMOTEROL FUM 200-5 MCG/ACT IN AERO
2.0000 | INHALATION_SPRAY | Freq: Two times a day (BID) | RESPIRATORY_TRACT | Status: DC
  Administered 2015-01-22 – 2015-01-24 (×5): 2 via RESPIRATORY_TRACT
  Filled 2015-01-21: qty 8.8

## 2015-01-21 MED ORDER — ONDANSETRON HCL 4 MG/2ML IJ SOLN: 4.0000 mg | Freq: Four times a day (QID) | INTRAMUSCULAR | Status: DC | PRN

## 2015-01-21 MED ORDER — ONDANSETRON HCL 4 MG/2ML IJ SOLN
4.0000 mg | Freq: Once | INTRAMUSCULAR | Status: AC
  Administered 2015-01-21: 4 mg via INTRAVENOUS
  Filled 2015-01-21: qty 2

## 2015-01-21 MED ORDER — FLUTICASONE PROPIONATE 50 MCG/ACT NA SUSP
1.0000 | Freq: Every day | NASAL | Status: DC
  Administered 2015-01-21 – 2015-01-24 (×4): 1 via NASAL
  Filled 2015-01-21: qty 16

## 2015-01-21 MED ORDER — PIPERACILLIN-TAZOBACTAM 3.375 G IVPB
3.3750 g | Freq: Three times a day (TID) | INTRAVENOUS | Status: DC
  Administered 2015-01-21 – 2015-01-24 (×10): 3.375 g via INTRAVENOUS
  Filled 2015-01-21 (×11): qty 50

## 2015-01-21 MED ORDER — SODIUM CHLORIDE 0.9 % IV BOLUS (SEPSIS): 1000.0000 mL | Freq: Once | INTRAVENOUS | Status: DC

## 2015-01-21 MED ORDER — BACITRACIN ZINC 500 UNIT/GM EX OINT
TOPICAL_OINTMENT | Freq: Every day | CUTANEOUS | Status: DC
  Administered 2015-01-21: 1 via TOPICAL
  Administered 2015-01-22: 10:00:00 via TOPICAL
  Administered 2015-01-23 – 2015-01-24 (×3): 1 via TOPICAL
  Filled 2015-01-21 (×4): qty 0.9

## 2015-01-21 MED ORDER — LIDOCAINE HCL 2 % EX GEL
CUTANEOUS | Status: AC
  Administered 2015-01-21: 05:00:00
  Filled 2015-01-21: qty 10

## 2015-01-21 MED ORDER — PIPERACILLIN-TAZOBACTAM 3.375 G IVPB
3.3750 g | Freq: Once | INTRAVENOUS | Status: AC
  Administered 2015-01-21: 3.375 g via INTRAVENOUS
  Filled 2015-01-21: qty 50

## 2015-01-21 MED ORDER — POTASSIUM CHLORIDE CRYS ER 20 MEQ PO TBCR
60.0000 meq | EXTENDED_RELEASE_TABLET | Freq: Once | ORAL | Status: AC
  Administered 2015-01-21: 60 meq via ORAL
  Filled 2015-01-21 (×2): qty 3

## 2015-01-21 MED ORDER — SODIUM CHLORIDE 0.9 % IV SOLN
6.0000 mg/kg | Freq: Once | INTRAVENOUS | Status: AC
  Administered 2015-01-21: 482 mg via INTRAVENOUS
  Filled 2015-01-21: qty 9.64

## 2015-01-21 MED ORDER — LORATADINE 10 MG PO TABS
10.0000 mg | ORAL_TABLET | Freq: Every day | ORAL | Status: DC
  Administered 2015-01-21 – 2015-01-24 (×4): 10 mg via ORAL
  Filled 2015-01-21 (×4): qty 1

## 2015-01-21 NOTE — ED Notes (Signed)
Bed: VO16 Expected date:  Expected time:  Means of arrival:  Comments: EMS N/V/D, hypotensive

## 2015-01-21 NOTE — Progress Notes (Signed)
Patient with fever of 102.6, PRN tylenol given,  Now down to 101.1. Dr. Clementeen Graham notified. Will continue to assess patient.

## 2015-01-21 NOTE — Progress Notes (Signed)
[facility] PROGRESS NOTE  Stephen Whitaker HYI:[id number] DOB: Feb 01, 1956 DOA: 01/21/2015 PCP: Angelica Chessman, MD Brief narrative 59 year old male with history of hypertension, COPD, chronic pancreatitis, amputation of left second toe presented to the ED with an episode of loss of consciousness. Patient was recently prescribed a two-week course of Bactrim for ulceration of his left great toe. For the past few days he has had multiple episodes of diarrhea with 2 episodes of vomiting and nausea. Also reported epigastric discomfort. For the past 2 days patient reports poor by mouth intake. On the night prior to admission he went to the kitchen to get some food when he became weak in past out for a few minutes with stool incontinence. On arrival patient was septic with hypotension, febrile and pancytopenic. Also had elevated lactate acid. X-ray of the left foot showed possible osteomyelitis. Patient admitted to telemetry for sepsis.   Assessment/Plan: Sepsis  Possibly related to acute ostium myelitis versus colitis. Follow blood cultures. On empiric IV vancomycin and Zosyn. Blood pressure improved with aggressive IV hydration. MRI of the left foot shows edema in the distal phalanx of the great toe possibly associated with osteomyelitis.  consulted orthopedics. (Dr  Lorin Mercy). Follow lactic acid closely. -UA unremarkable. Check stool for C. difficile and GI pathogen panel.. Normal cortisol level  Syncope Patient has had similar symptoms recently and had outpatient workup including normal 2-D echo and carotids. CT head unremarkable. EEG results pending.  Acute pancreatitis/ possible colitis Monitor with IV hydration.  Pancytopenia Possibly related to sepsis and recent use of Bactrim. Appreciate hematology consult. Monitor CBC closely. Check LDH, folate,. Stool for occult blood negative. Findings deficiency. Normal TSH and B12.    Lactic acidosis Secondary to sepsis. Monitor with IV  hydration.  Hypokalemia Replenished with IV and by mouth KCl.   Diet: Regular DVT prophylaxis: SCDs  Code Status: Full code Family Communication: None at bedside Disposition Plan: Continue inpatient monitoring   Consultants:  Hematology  Orthopedics  Procedures:  EEG   head CT  MRI left foot  Antibiotics:  IV vancomycin and Zosyn.  HPI/Subjective: Seen and examined. Admission H&P reviewed. Reports mild abdominal discomfort. No diarrhea since admission.  Objective: Filed Vitals:   01/21/15 1517  BP: 106/57  Pulse: 106  Temp: 102.6 F (39.2 C)  Resp: 18    Intake/Output Summary (Last 24 hours) at 01/21/15 1535 Last data filed at 01/21/15 1520  Gross per 24 hour  Intake      0 ml  Output   1980 ml  Net  -1980 ml   Filed Weights   01/21/15 0649  Weight: 80.3 kg (177 lb 0.5 oz)    Exam:   General:  Middle aged thin built male in no acute distress  HEENT: Pallor present, dry oral mucosa, supple neck  Chest: Clear to auscultation bilaterally, no added sounds  CVS: Normal S1 and S2, no murmurs rub or gallop  GI: Soft, nondistended, nontender, bowel sounds present  Musculoskeletal: Warm, no edema, ulcerations over base of left great toe, without discharge but some foul-smelling  CNS: Alert and oriented.    Data Reviewed: Basic Metabolic Panel:  Recent Labs Lab 01/21/15 0413 01/21/15 0730  NA 133* 133*  K 2.8* 3.0*  CL 101 103  CO2 22 25  GLUCOSE 107* 112*  BUN 30* 26*  CREATININE 1.24 1.17  CALCIUM 7.4* 7.1*  MG  --  1.8   Liver Function Tests:  Recent Labs Lab 01/21/15 0413  AST 49*  ALT 73*  ALKPHOS 44  BILITOT 1.1  PROT 6.6  ALBUMIN 2.8*    Recent Labs Lab 01/21/15 0413 01/21/15 0730  LIPASE 23 115*   No results for input(s): AMMONIA in the last 168 hours. CBC:  Recent Labs Lab 01/21/15 0413 01/21/15 0627  WBC 0.8* 0.9*  NEUTROABS 0.0* 0.1*  HGB 9.3* 10.0*  HCT 28.0* 31.0*  MCV 80.2 80.9  PLT 286  PLATELET CLUMPS NOTED ON SMEAR, COUNT APPEARS ADEQUATE   Cardiac Enzymes:  Recent Labs Lab 01/21/15 0730  TROPONINI <0.03   BNP (last 3 results) No results for input(s): BNP in the last 8760 hours.  ProBNP (last 3 results) No results for input(s): PROBNP in the last 8760 hours.  CBG: No results for input(s): GLUCAP in the last 168 hours.  No results found for this or any previous visit (from the past 240 hour(s)).   Studies: Ct Head Wo Contrast  01/21/2015   CLINICAL DATA:  Nausea, vomiting, syncope, history hypertension, pancreatitis, chills, weakness, hypotensive at presentation  EXAM: CT HEAD WITHOUT CONTRAST  TECHNIQUE: Contiguous axial images were obtained from the base of the skull through the vertex without intravenous contrast.  COMPARISON:  None  FINDINGS: Mild atrophy.  Normal ventricular morphology.  No midline shift or mass effect.  Normal appearance of brain parenchyma.  Streak artifacts at skull base, for which repeat imaging was performed.  No intracranial hemorrhage, mass lesion or evidence acute infarction.  No extra-axial fluid collections.  Bones and sinuses unremarkable.  IMPRESSION: No acute intracranial abnormalities.   Electronically Signed   By: Lavonia Dana M.D.   On: 01/21/2015 12:36   Ct Abdomen Pelvis W Contrast  01/21/2015   CLINICAL DATA:  59 year old next the with history of hypertension, pancreatitis, presents with nausea, vomiting, syncope. Epigastric pain, worsening with vomiting. Diarrhea in chills.  EXAM: CT ABDOMEN AND PELVIS WITH CONTRAST  TECHNIQUE: Multidetector CT imaging of the abdomen and pelvis was performed using the standard protocol following bolus administration of intravenous contrast.  CONTRAST:  144mL OMNIPAQUE IOHEXOL 300 MG/ML  SOLN  COMPARISON:  09/20/2003  FINDINGS: Lower chest: Dependent opacities in the lung bases, likely atelectasis. No effusions. Heart is normal size.  Hepatobiliary: No biliary ductal dilatation or focal hepatic  lesion. Gallbladder grossly unremarkable.  Pancreas: No focal pancreatic abnormality. Small amount of fluid adjacent to the pancreatic tail in the left anterior para renal space. Small amount of fluid also noted in the right anterior para renal space.  Spleen: No focal abnormality.  Normal size.  Adrenals/Urinary Tract: Small cysts in the upper pole and midpole of the right kidney. No hydronephrosis. No renal or ureteral stones. Adrenal glands and urinary bladder unremarkable. Foley catheter in place within the bladder.  Stomach/Bowel: Stomach, large and small bowel grossly unremarkable. Appendix is visualized and unremarkable.  Vascular/Lymphatic: No retroperitoneal or mesenteric adenopathy. Aorta normal caliber.  Reproductive: No mass or other significant abnormality.  Other: No free fluid or free air.  Musculoskeletal: No focal bone lesion or acute bony abnormality.  : Small amount of fluid within the anterior para renal spaces bilaterally, including adjacent to the pancreatic tail. While no definite focal pancreatic process or inflammatory change around the pancreas is noted, this could reflect changes of early acute pancreatitis. Recommend clinical correlation.  Small right renal cysts.  Posterior bibasilar opacities, likely atelectasis.   Electronically Signed   By: Rolm Baptise M.D.   On: 01/21/2015 12:33   Mr Foot Left Wo Contrast  01/21/2015   CLINICAL DATA:  Ulceration on the plantar aspect of the great toe. Elevated white blood cell count and fever. Initial encounter  EXAM: MRI OF THE LEFT FOREFOOT WITHOUT CONTRAST  TECHNIQUE: Multiplanar, multisequence MR imaging was performed. No intravenous contrast was administered.  COMPARISON:  Plain films of the left great toe 01/2002/ 2016 and plain films left foot 06/14/2014.  FINDINGS: There appears to be a skin ulceration on the plantar surface of the great toe just distal to the IP joint. There is marrow edema in the distal 1.4 cm of the proximal phalanx of  the great toe eccentric on the medial side. The small cortical defect seen on the most recent plain films appears well-marginated without adjacent edema.  There is first MTP joint space narrowing with mild subchondral edema in the central head of the first metatarsal consistent with osteoarthritis. Bone marrow signal is otherwise unremarkable. No fluid collection is identified. Intrinsic musculature the foot is somewhat atrophic.  IMPRESSION: Skin ulceration appears to be on the plantar surface of the great toe at or just distal to the IP joint. There is no abscess. Edema in the distal 1.4 cm of the proximal phalanx of the great toe could be secondary to osteomyelitis, inflammatory change or stress change.  Mild appearing first MTP osteoarthritis.   Electronically Signed   By: Inge Rise M.D.   On: 01/21/2015 13:21   Dg Chest Portable 1 View  01/21/2015   CLINICAL DATA:  Vomiting for several days, weakness and dehydration.  EXAM: PORTABLE CHEST - 1 VIEW  COMPARISON:  11/13/2014  FINDINGS: Lung volumes are low. Minimal linear atelectasis at the lung bases, left greater than right. The cardiomediastinal contours are normal. Pulmonary vasculature is normal for technique. No consolidation, pleural effusion, or pneumothorax. No acute osseous abnormalities are seen.  IMPRESSION: Hypoventilatory chest with bibasilar atelectasis.   Electronically Signed   By: Jeb Levering M.D.   On: 01/21/2015 02:46   Dg Toe Great Left  01/21/2015   CLINICAL DATA:  Ulceration at the plantar aspect of the great toe. Initial encounter.  EXAM: LEFT GREAT TOE  COMPARISON:  Left foot radiographs performed 06/14/2014  FINDINGS: The known soft tissue ulceration is difficult to fully characterize on radiograph. No radiopaque foreign bodies are seen. Scattered soft tissue calcifications are suggested at the great toe. There is suggestion of a small erosion at the medial distal aspect of the first proximal phalanx.  The visualized joint  spaces are preserved. The patient is status post amputation of the distal second metatarsal. No osseous erosions are  IMPRESSION: 1. Suggestion of a small erosion at the medial distal aspect of the first proximal phalanx; osteomyelitis is a concern. MRI could be considered for further evaluation as deemed clinically appropriate. 2. Soft tissue ulceration not well characterized on radiograph. No radiopaque foreign bodies seen. Scattered soft calcifications suggested at the great toe.   Electronically Signed   By: Garald Balding M.D.   On: 01/21/2015 02:42    Scheduled Meds: . allopurinol  100 mg Oral Daily  . bacitracin   Topical Daily  . fluticasone  1 spray Each Nare Daily  . lipase/protease/amylase  24,000 Units Oral Daily  . loratadine  10 mg Oral Daily  . mometasone-formoterol  2 puff Inhalation BID  . piperacillin-tazobactam (ZOSYN)  IV  3.375 g Intravenous Q8H  . potassium chloride      . sodium chloride  1,000 mL Intravenous Once  . vancomycin  1,000 mg Intravenous  Q12H   Continuous Infusions: . 0.9 % NaCl with KCl 20 mEq / L 150 mL/hr at 01/21/15 0941     Time spent: 35 minutes    Delontae Lamm, [place]  [facility] Pager [phone]. If 7PM-7AM, please contact night-coverage at [email or web address], password [facility] 01/21/2015, 3:35 PM  LOS: 0 days

## 2015-01-21 NOTE — ED Provider Notes (Addendum)
CSN: [id number]     Arrival date & time 01/21/15  0139 History   First MD Initiated Contact with Patient 01/21/15 0141     Chief Complaint  Patient presents with  . Emesis     (Consider location/radiation/quality/duration/timing/severity/associated sxs/prior Treatment) HPI  This is a 59 year old male with a history of hypertension, pancreatitis who presents with nausea, vomiting, and a syncopal event. Patient reports that he has not felt well for the last day or 2. He reports abdominal pain which she describes as a "sore feeling" in the epigastrium worse with vomiting.  Also reports associated diarrhea and chills. Patient had a syncopal episode prior to EMS being called. He states that he's felt very weak. EMS noted blood pressure of 80/40. Patient reports chills but has not documented a fever at home. Of note, patient has a healing ulcer over the left great toe. He was given an antibiotics 2 weeks ago by Dr. Sharol Given.   On admission, patient noted to be febrile and tachycardic.  Past Medical History  Diagnosis Date  . Hypertension   . Pancreatitis, acute    Past Surgical History  Procedure Laterality Date  . Tonsillectomy    . Amputation Left 07/14/2014    Procedure: 2nd Toe Amputation;  Surgeon: [name], MD;  Location: [facility];  Service: Orthopedics;  Laterality: Left;   Family History  Problem Relation Age of Onset  . Adopted: Yes   History  Substance Use Topics  . Smoking status: Never Smoker   . Smokeless tobacco: Not on file  . Alcohol Use: Yes    Review of Systems  Constitutional: Positive for fever, chills and fatigue.  Respiratory: Negative.  Negative for chest tightness and shortness of breath.   Cardiovascular: Negative.  Negative for chest pain.  Gastrointestinal: Positive for nausea, vomiting, abdominal pain and diarrhea.  Genitourinary: Negative.  Negative for dysuria.  Musculoskeletal: Negative for back pain.  Skin: Positive for wound. Negative for color  change.  Neurological: Positive for weakness. Negative for headaches.  All other systems reviewed and are negative.     Allergies  Shellfish allergy  Home Medications   Prior to Admission medications   Medication Sig Start Date End Date Taking? Authorizing Provider  albuterol (PROVENTIL HFA;VENTOLIN HFA) 108 (90 BASE) MCG/ACT inhaler Inhale 2 puffs into the lungs every 6 (six) hours as needed for wheezing or shortness of breath.   Yes Historical Provider, MD  allopurinol (ZYLOPRIM) 100 MG tablet Take 100 mg by mouth daily.  07/19/14  Yes Historical Provider, MD  Bioflavonoid Products (ESTER C PO) Take 1 tablet by mouth daily.   Yes Historical Provider, MD  clobetasol ointment (TEMOVATE) 0.25 % Apply 1 application topically 2 (two) times daily. 11/05/14  Yes Tresa Garter, MD  Digestive Enzymes (DIGESTIVE ENZYME PO) Take 1 capsule by mouth 3 (three) times daily with meals. Glide brand   Yes Historical Provider, MD  fluticasone (FLONASE) 50 MCG/ACT nasal spray Place 1 spray into both nostrils daily. 12/29/14  Yes Historical Provider, MD  Fluticasone-Salmeterol (ADVAIR) 500-50 MCG/DOSE AEPB Inhale 1 puff into the lungs 2 (two) times daily. 10/21/14  Yes Tresa Garter, MD  hydrochlorothiazide (HYDRODIURIL) 25 MG tablet Take 1 tablet (25 mg total) by mouth daily. 08/10/14  Yes Tresa Garter, MD  ibuprofen (ADVIL,MOTRIN) 200 MG tablet Take 800 mg by mouth every 6 (six) hours as needed for moderate pain.   Yes Historical Provider, MD  loratadine (CLARITIN) 10 MG tablet Take 10 mg  by mouth daily.   Yes Historical Provider, MD  Multiple Vitamin (MULTIVITAMIN WITH MINERALS) TABS tablet Take 1 tablet by mouth daily.   Yes Historical Provider, MD  OVER THE COUNTER MEDICATION Take 1 capsule by mouth 3 (three) times daily with meals. Blue/green algae   Yes Historical Provider, MD  Pancrelipase, Lip-Prot-Amyl, (CREON) 24000 UNITS CPEP Take 1 capsule (24,000 Units total) by mouth daily.  08/10/14  Yes Tresa Garter, MD  pantoprazole (PROTONIX) 40 MG tablet Take 1 tablet (40 mg total) by mouth daily. 08/10/14  Yes Tresa Garter, MD  ibuprofen (ADVIL,MOTRIN) 800 MG tablet Take 1 tablet (800 mg total) by mouth every 8 (eight) hours as needed for mild pain. Patient not taking: Reported on 10/21/2014 08/10/14   Tresa Garter, MD  levofloxacin (LEVAQUIN) 500 MG tablet Take 1 tablet (500 mg total) by mouth daily. Patient not taking: Reported on 01/21/2015 11/13/14   Melony Overly, MD  predniSONE (DELTASONE) 50 MG tablet Take 1 pill daily for 5 days. Patient not taking: Reported on 01/21/2015 11/13/14   Melony Overly, MD  sulfamethoxazole-trimethoprim (BACTRIM DS,SEPTRA DS) 800-160 MG per tablet Take 2 tablets by mouth 2 (two) times daily. Patient not taking: Reported on 01/21/2015 12/30/14   Gregor Hams, MD   BP 121/65 mmHg  Pulse 100  Temp(Src) 100.8 F (38.2 C) (Oral)  Resp 18  SpO2 96% Physical Exam  Constitutional: He is oriented to person, place, and time.  Thin, ill-appearing but nontoxic  HENT:  Head: Normocephalic and atraumatic.  Mucous membranes dry  Eyes: Pupils are equal, round, and reactive to light.  Neck: Neck supple.  Cardiovascular: Regular rhythm and normal heart sounds.   No murmur heard. Tachycardia  Pulmonary/Chest: Effort normal and breath sounds normal. No respiratory distress. He has no wheezes.  Abdominal: Soft. Bowel sounds are normal. There is no tenderness. There is no rebound.  Musculoskeletal: He exhibits no edema.  1 cm ulcerative wound over the plantar aspect of the left great toe, no spontaneous drainage noted, wound edges are well granulated, no adjacent skin changes  Neurological: He is alert and oriented to person, place, and time.  Skin: Skin is warm and dry.  Psychiatric: He has a normal mood and affect.  Nursing note and vitals reviewed.   ED Course  Procedures (including critical care time)  CRITICAL CARE Performed by:  Merryl Hacker   Total critical care time: 45 min  Critical care time was exclusive of separately billable procedures and treating other patients.  Critical care was necessary to treat or prevent imminent or life-threatening deterioration.  Critical care was time spent personally by me on the following activities: development of treatment plan with patient and/or surrogate as well as nursing, discussions with consultants, evaluation of patient's response to treatment, examination of patient, obtaining history from patient or surrogate, ordering and performing treatments and interventions, ordering and review of laboratory studies, ordering and review of radiographic studies, pulse oximetry and re-evaluation of patient's condition.  Labs Review Labs Reviewed  URINALYSIS, ROUTINE W REFLEX MICROSCOPIC (NOT AT [facility]) - Abnormal; Notable for the following:    Color, Urine AMBER (*)    APPearance CLOUDY (*)    Hgb urine dipstick TRACE (*)    Protein, ur 100 (*)    All other components within normal limits  CBC WITH DIFFERENTIAL/PLATELET - Abnormal; Notable for the following:    WBC 0.8 (*)    RBC 3.49 (*)    Hemoglobin 9.3 (*)  HCT 28.0 (*)    All other components within normal limits  COMPREHENSIVE METABOLIC PANEL - Abnormal; Notable for the following:    Sodium 133 (*)    Potassium 2.8 (*)    Glucose, Bld 107 (*)    BUN 30 (*)    Calcium 7.4 (*)    Albumin 2.8 (*)    AST 49 (*)    ALT 73 (*)    All other components within normal limits  URINE MICROSCOPIC-ADD ON - Abnormal; Notable for the following:    Bacteria, UA MANY (*)    Casts GRANULAR CAST (*)    All other components within normal limits  I-STAT CG4 LACTIC ACID, ED - Abnormal; Notable for the following:    Lactic Acid, Venous 2.76 (*)    All other components within normal limits  CULTURE, BLOOD (ROUTINE X 2)  CULTURE, BLOOD (ROUTINE X 2)  URINE CULTURE  LIPASE, BLOOD  CBC WITH DIFFERENTIAL/PLATELET  RAPID HIV  SCREEN (HIV 1/2 AB+AG)  I-STAT CG4 LACTIC ACID, ED    Imaging Review Dg Chest Portable 1 View  01/21/2015   CLINICAL DATA:  Vomiting for several days, weakness and dehydration.  EXAM: PORTABLE CHEST - 1 VIEW  COMPARISON:  11/13/2014  FINDINGS: Lung volumes are low. Minimal linear atelectasis at the lung bases, left greater than right. The cardiomediastinal contours are normal. Pulmonary vasculature is normal for technique. No consolidation, pleural effusion, or pneumothorax. No acute osseous abnormalities are seen.  IMPRESSION: Hypoventilatory chest with bibasilar atelectasis.   Electronically Signed   By: Jeb Levering M.D.   On: 01/21/2015 02:46   Dg Toe Great Left  01/21/2015   CLINICAL DATA:  Ulceration at the plantar aspect of the great toe. Initial encounter.  EXAM: LEFT GREAT TOE  COMPARISON:  Left foot radiographs performed 06/14/2014  FINDINGS: The known soft tissue ulceration is difficult to fully characterize on radiograph. No radiopaque foreign bodies are seen. Scattered soft tissue calcifications are suggested at the great toe. There is suggestion of a small erosion at the medial distal aspect of the first proximal phalanx.  The visualized joint spaces are preserved. The patient is status post amputation of the distal second metatarsal. No osseous erosions are  IMPRESSION: 1. Suggestion of a small erosion at the medial distal aspect of the first proximal phalanx; osteomyelitis is a concern. MRI could be considered for further evaluation as deemed clinically appropriate. 2. Soft tissue ulceration not well characterized on radiograph. No radiopaque foreign bodies seen. Scattered soft calcifications suggested at the great toe.   Electronically Signed   By: Garald Balding M.D.   On: 01/21/2015 02:42     EKG Interpretation None      MDM   Final diagnoses:  Sepsis, due to unspecified organism  Osteomyelitis  Hypokalemia  Leukopenia    Patient presents with abdominal pain, nausea,  vomiting, diarrhea, and noted to be febrile and tachycardic on admission. Typical episode prior to arrival. Was hypotensive for EMS. Initial blood pressure 109/67; however subsequent blood pressures 95/53. Patient has a history of hypertension at baseline. For this reason, sepsis workup was initiated including blood cultures.  Source is unclear at this time.  Chest x-ray and x-ray of the great toe obtained. X-rays great toe concerning for possible osteomyelitis. Patient given vancomycin and Zosyn. There was a significant delay in obtaining patient lab work. See nursing notes for detail. Patient was given a total of 3 L of normal saline for marginal blood pressures and was  placed on normal saline infusion.  5:30 AM  I just spoke with the lab to get an update on lab work. Per the lab had not received repeat lab work. I confirmed that it was redrawn at approximate 4:30 AM. Upon my questioning, they now state that they have received it and will begin to run it.  5:50 AM Lab work notable for lactate of 2.7. This is following 3 L of volume resuscitation. Likely much higher with initial lab draw which was hemolyzed.  Other workup notable for hypokalemia and leukopenia. Patient with history of white counts between 2 and 3. White count on this blood draw was 0.8. Also noted to be acutely anemic with a hemoglobin of 9.3. Baseline normal. Platelet count is within normal range.  Chest x-ray and urinalysis without evidence of infection. Repeat exam, patient is more comfortable. Abdominal exam is benign and without signs of peritonitis. Patient could have colitis versus gastroenteritis. No indication for imaging at this time. Likely source of sepsis left great toe. Discuss with patient further testing as well given leukopenia and anemia. Patient reports he's had testing in the past. Denies any behaviors that would put him at risk for HIV. However he is consented for further testing including HIV.  CBC recent. Differential  with 0 neutrophils and anemia which is new. Patient denies any rectal bleeding.  Discussed with admitting hospitalist. Patient's blood pressure has responded nicely to IV fluids. Currently 120s over 5s. Heart rate in the 90s. Will admit to telemetry.  Merryl Hacker, MD 01/21/15 8329  Merryl Hacker, MD 01/21/15 1916  Merryl Hacker, MD 01/21/15 6060  Merryl Hacker, MD 01/24/15 2220

## 2015-01-21 NOTE — Consult Note (Signed)
[place]  Telephone:([phone]) [place] NOTE  FIELDS OROS                                MR#: [id number]  DOB: [birth year]                       CSN#: [id number]  Referring MD: [facility]     Patient Care Team: Tresa Garter, MD as PCP - General (Internal Medicine)  Reason for Consult: Leukopenia   MVE:[id number] W Leja is a  59 y.o.male admitted via EMS on 6/3 due to loss of consciousness in the setting of hypotension,requiring IV fluid resucitation. Patient had several episodes of nausea, vomiting and diarrhea, with abdominal cramping prior to admission. He was weak and lethargic as well. He had no respiratory or cardiac complaints. He denied any headaches or vision changes. Chest x ray was negative for acute changes. CT of the abdomen and pelvis is pending. Left great toe x ray was suspicious for osteomyelitis. He was admitted for management of possible sepsis. CBC is notable for leukopenia and mild anemia, in the setting of infection. Currently WBC is 0.9. ANC is 0.1. On admission WBC was 0.8, ANC was 0.  Labs from 07/2014 showed WBC already at 2.8 with ANC of 0.9, and in 2011 similar values, with WBC at 2.5, ANC 1.1. Smear has been ordered for review. Patient has ben seen in the past by Dr. Alen Blew at the [facility] at which time this was felt to be chronic. No bone marrow biopsy was performed at the time.  We were kindly asked to see the patient with recommendations.   PMH:  Past Medical History  Diagnosis Date  . Hypertension   . Pancreatitis, acute     Surgeries:  Past Surgical History  Procedure Laterality Date  . Tonsillectomy    . Amputation Left 07/14/2014    Procedure: 2nd Toe Amputation;  Surgeon: [name], MD;  Location: [place];  Service: Orthopedics;  Laterality: Left;    Allergies:  Allergies  Allergen Reactions  . Shellfish Allergy Swelling    Medications:   Prior to Admission:    Prescriptions prior to admission  Medication Sig Dispense Refill Last Dose  . albuterol (PROVENTIL HFA;VENTOLIN HFA) 108 (90 BASE) MCG/ACT inhaler Inhale 2 puffs into the lungs every 6 (six) hours as needed for wheezing or shortness of breath.   01/20/2015 at Unknown time  . allopurinol (ZYLOPRIM) 100 MG tablet Take 100 mg by mouth daily.    01/20/2015 at Unknown time  . Bioflavonoid Products (ESTER C PO) Take 1 tablet by mouth daily.   01/20/2015 at Unknown time  . clobetasol ointment (TEMOVATE) 6.28 % Apply 1 application topically 2 (two) times daily. 30 g 0 Past Month at Unknown time  . Digestive Enzymes (DIGESTIVE ENZYME PO) Take 1 capsule by mouth 3 (three) times daily with meals. Flanagan brand   01/20/2015 at Unknown time  . fluticasone (FLONASE) 50 MCG/ACT nasal spray Place 1 spray into both nostrils daily.   01/20/2015 at Unknown time  . Fluticasone-Salmeterol (ADVAIR) 500-50 MCG/DOSE AEPB Inhale 1 puff into the lungs 2 (two) times daily. 60 each 3 01/20/2015 at Unknown time  . hydrochlorothiazide (HYDRODIURIL) 25 MG tablet Take 1 tablet (25 mg total) by mouth daily. 30 tablet 3 01/20/2015 at Unknown time  . ibuprofen (ADVIL,MOTRIN)  200 MG tablet Take 800 mg by mouth every 6 (six) hours as needed for moderate pain.   01/20/2015 at Unknown time  . loratadine (CLARITIN) 10 MG tablet Take 10 mg by mouth daily.   01/20/2015 at Unknown time  . Multiple Vitamin (MULTIVITAMIN WITH MINERALS) TABS tablet Take 1 tablet by mouth daily.   01/20/2015 at Unknown time  . OVER THE COUNTER MEDICATION Take 1 capsule by mouth 3 (three) times daily with meals. Blue/green algae   01/20/2015 at Unknown time  . Pancrelipase, Lip-Prot-Amyl, (CREON) 24000 UNITS CPEP Take 1 capsule (24,000 Units total) by mouth daily. 180 capsule 3 01/20/2015 at Unknown time  . pantoprazole (PROTONIX) 40 MG tablet Take 1 tablet (40 mg total) by mouth daily. 30 tablet 3 01/20/2015 at Unknown time  . ibuprofen (ADVIL,MOTRIN) 800 MG tablet Take 1 tablet (800 mg  total) by mouth every 8 (eight) hours as needed for mild pain. (Patient not taking: Reported on 10/21/2014) 60 tablet 1 Unknown at Unknown time  . levofloxacin (LEVAQUIN) 500 MG tablet Take 1 tablet (500 mg total) by mouth daily. (Patient not taking: Reported on 01/21/2015) 7 tablet 0   . predniSONE (DELTASONE) 50 MG tablet Take 1 pill daily for 5 days. (Patient not taking: Reported on 01/21/2015) 5 tablet 0   . sulfamethoxazole-trimethoprim (BACTRIM DS,SEPTRA DS) 800-160 MG per tablet Take 2 tablets by mouth 2 (two) times daily. (Patient not taking: Reported on 01/21/2015) 28 tablet 0     Scheduled Meds: . allopurinol  100 mg Oral Daily  . fluticasone  1 spray Each Nare Daily  . iohexol  25 mL Oral Q1 Hr x 2  . lipase/protease/amylase  24,000 Units Oral Daily  . loratadine  10 mg Oral Daily  . mometasone-formoterol  2 puff Inhalation BID  . piperacillin-tazobactam (ZOSYN)  IV  3.375 g Intravenous Q8H  . potassium chloride      . sodium chloride  1,000 mL Intravenous Once  . sodium chloride  1,000 mL Intravenous Once  . vancomycin  1,000 mg Intravenous Q12H   Continuous Infusions: . 0.9 % NaCl with KCl 20 mEq / L     PRN Meds:.acetaminophen **OR** acetaminophen, albuterol, ondansetron **OR** ondansetron (ZOFRAN) IV  ROS: See HPI for significant positives.   Family History:    Family History  Problem Relation Age of Onset  . Adopted: Yes    No family history of hematological  disorders.  Social History:  reports that he has never smoked. He does not have any smokeless tobacco history on file. He reports that he drinks alcohol. His drug history is not on file.  Physical Exam   ECOG PERFORMANCE STATUS:  Filed Vitals:   01/21/15 0815  BP: 112/57  Pulse: 99  Temp: 100.3 F (37.9 C)  Resp: 20   Filed Weights   01/21/15 0649  Weight: 177 lb 0.5 oz (80.3 kg)    GENERAL: no distress, ill appearing SKIN: remarkable for left great toe ulceration EYES: normal, conjunctiva are  pink and non-injected, sclera clear OROPHARYNX:no exudate, no erythema and lips, buccal mucosa, and tongue normal  NECK: supple, thyroid normal size, non-tender, without nodularity LYMPH:  no palpable lymphadenopathy in the cervical, axillary or inguinal area LUNGS: clear to auscultation and percussion with normal breathing effort HEART: regular rate & rhythm and no murmurs and no lower extremity edema ABDOMEN  soft, non-tender and normal bowel sounds Musculoskeletal:no cyanosis of digits and no clubbing. Left toe amputation PSYCH: alert & oriented x 3  with fluent speech NEURO: no focal motor/sensory deficits  Labs:    Recent Labs Lab 01/21/15 0413 01/21/15 0627  WBC 0.8* 0.9*  HGB 9.3* 10.0*  HCT 28.0* 31.0*  PLT 286 PLATELET CLUMPS NOTED ON SMEAR, COUNT APPEARS ADEQUATE  MCV 80.2 80.9  MCH 26.6 26.1  MCHC 33.2 32.3  RDW 13.9 13.8  LYMPHSABS 0.5* 0.5*  MONOABS 0.3 0.3  EOSABS 0.0 0.0  BASOSABS 0.0 0.0       Recent Labs Lab 01/21/15 0413 01/21/15 0730  NA 133* 133*  K 2.8* 3.0*  CL 101 103  CO2 22 25  GLUCOSE 107* 112*  BUN 30* 26*  CREATININE 1.24 1.17  CALCIUM 7.4* 7.1*  MG  --  1.8  AST 49*  --   ALT 73*  --   ALKPHOS 44  --   BILITOT 1.1  --         Component Value Date/Time   BILITOT 1.1 01/21/2015 0413   BILIDIR 0.1 03/13/2010 2219   IBILI 0.4 03/13/2010 2219     No results for input(s): INR, PROTIME in the last 168 hours.  No results for input(s): DDIMER in the last 72 hours.   Anemia panel:  No results for input(s): VITAMINB12, FOLATE, FERRITIN, TIBC, IRON, RETICCTPCT in the last 72 hours.  Urinalysis    Component Value Date/Time   COLORURINE AMBER* 01/21/2015 0454   APPEARANCEUR CLOUDY* 01/21/2015 0454   LABSPEC 1.019 01/21/2015 0454   PHURINE 6.0 01/21/2015 0454   GLUCOSEU NEGATIVE 01/21/2015 0454   HGBUR TRACE* 01/21/2015 0454   BILIRUBINUR NEGATIVE 01/21/2015 0454   KETONESUR NEGATIVE 01/21/2015 0454   PROTEINUR 100*  01/21/2015 0454   UROBILINOGEN 1.0 01/21/2015 0454   NITRITE NEGATIVE 01/21/2015 0454   LEUKOCYTESUR NEGATIVE 01/21/2015 0454    Drugs of Abuse  No results found for: LABOPIA, COCAINSCRNUR, LABBENZ, AMPHETMU, THCU, LABBARB    Imaging Studies:  Dg Chest Portable 1 View  01/21/2015   CLINICAL DATA:  Vomiting for several days, weakness and dehydration.  EXAM: PORTABLE CHEST - 1 VIEW  COMPARISON:  11/13/2014  FINDINGS: Lung volumes are low. Minimal linear atelectasis at the lung bases, left greater than right. The cardiomediastinal contours are normal. Pulmonary vasculature is normal for technique. No consolidation, pleural effusion, or pneumothorax. No acute osseous abnormalities are seen.  IMPRESSION: Hypoventilatory chest with bibasilar atelectasis.   Electronically Signed   By: Jeb Levering M.D.   On: 01/21/2015 02:46   Dg Toe Great Left  01/21/2015   CLINICAL DATA:  Ulceration at the plantar aspect of the great toe. Initial encounter.  EXAM: LEFT GREAT TOE  COMPARISON:  Left foot radiographs performed 06/14/2014  FINDINGS: The known soft tissue ulceration is difficult to fully characterize on radiograph. No radiopaque foreign bodies are seen. Scattered soft tissue calcifications are suggested at the great toe. There is suggestion of a small erosion at the medial distal aspect of the first proximal phalanx.  The visualized joint spaces are preserved. The patient is status post amputation of the distal second metatarsal. No osseous erosions are  IMPRESSION: 1. Suggestion of a small erosion at the medial distal aspect of the first proximal phalanx; osteomyelitis is a concern. MRI could be considered for further evaluation as deemed clinically appropriate. 2. Soft tissue ulceration not well characterized on radiograph. No radiopaque foreign bodies seen. Scattered soft calcifications suggested at the great toe.   Electronically Signed   By: Garald Balding M.D.   On: 01/21/2015 02:42  Assessment  and Plan 59 y.o.  Leukopenia Likely acute on chronic In the setting of sepsis, antibiotics and IV dilution Smear ordered for review. Will consider Granix as ANC is less than 1.0 Continue to closely monitor  Anemia of chronic disease In the setting of dilution infection antibiotics No transfusion is indicated at this time Monitor counts closely Transfuse blood to maintain a Hb of 8 g or if the patient is acutely bleeding  Full Code   Other medical issues as per admitting team    [facility] E, PA-C 01/21/2015 9:12 AM   ADDENDUM:  I saw and examined the patient.  I will get his blood smear. He has marked decrease in his white blood cells. He has fairly mature white blood cells. I do not see any blasts.  He's had some leukopenia for several years. 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 This affects about 20-25% of African-Americans. It typically does not cause any issues and does not increase risk of infection.  I think the leukopenia might be a combination of factors. He is on vancomycin. I will consider changing the vancomycin. Vancomycin does cause reversible neutropenia.  I think just the stress of the underlying infection/inflammation might be a source for the worsening neutropenia.  It sounds like he describes Raynaud's syndrome. As such, I think he bodies be checked for ANA and rheumatoid factor.  I cannot find anything on his exam that was suspicious or pointed to the source of his neutropenia.  I have to believe that he will always have some degree of leukopenia but that his neutrophils will increase back up to baseline.  He is very nice. He had good fellowship.  We will follow along and help out as necessary.  [place]

## 2015-01-21 NOTE — Progress Notes (Signed)
Offsite EEG completed at [facility]. Results pending. 

## 2015-01-21 NOTE — ED Notes (Signed)
Blood drawn from patient's EMS IV, at bedside.

## 2015-01-21 NOTE — ED Notes (Signed)
Per screening tool, patient does not meet sepsis criteria.  However, patient is hypotensive, tachycardic, and febrile.

## 2015-01-21 NOTE — Progress Notes (Signed)
ANTIBIOTIC CONSULT NOTE - INITIAL  Pharmacy Consult for Daptomycin Indication: sepsis, possible toe osteomyelitis  Allergies  Allergen Reactions  . Shellfish Allergy Swelling    Patient Measurements: Height: 6' 2.5" (189.2 cm) Weight: 177 lb 0.5 oz (80.3 kg) IBW/kg (Calculated) : 83.35 Adjusted Body Weight:   Vital Signs: Temp: 101.1 F (38.4 C) (06/03 1652) Temp Source: Oral (06/03 1652) BP: 106/57 mmHg (06/03 1517) Pulse Rate: 106 (06/03 1517) Intake/Output from previous day: 06/02 0701 - 06/03 0700 In: -  Out: 580 [Urine:580] Intake/Output from this shift: Total I/O In: 1350 [IV Piggyback:1350] Out: 1650 [Urine:1650]  Labs:  Recent Labs  01/21/15 0413 01/21/15 0627 01/21/15 0730  WBC 0.8* 0.9*  --   HGB 9.3* 10.0*  --   PLT 286 PLATELET CLUMPS NOTED ON SMEAR, COUNT APPEARS ADEQUATE  --   CREATININE 1.24  --  1.17   Estimated Creatinine Clearance: 76.3 mL/min (by C-G formula based on Cr of 1.17). No results for input(s): VANCOTROUGH, VANCOPEAK, VANCORANDOM, GENTTROUGH, GENTPEAK, GENTRANDOM, TOBRATROUGH, TOBRAPEAK, TOBRARND, AMIKACINPEAK, AMIKACINTROU, AMIKACIN in the last 72 hours.   Microbiology: No results found for this or any previous visit (from the past 720 hour(s)).  Medical History: Past Medical History  Diagnosis Date  . Hypertension   . Pancreatitis, acute      Assessment: 70 yoF with history of hypertension, COPD, chronic pancreatitis, amputation of left second toe presented to the ED septic with hypotension, febrile and pancytopenic.  Patient was started on vancomycin and zosyn for sepsis, great toe ulcer.  MRI of foot noted that the edema of the great toe could be secondary to osteomyelitis, inflammatory change or stress change.  Hem/Onc was consulted for leukopenia.  Dr. Marin Olp concerned that vancomycin can cause neutropenia and wants to change to daptomycin.  Pharmacy will allow a one-time order for tonight since this is a restricted  medication that requires ID approval.  Goal of Therapy:  Doses adjusted per renal function Eradication of infection  Plan:  Daptomycin 482 mg (6 mg/kg) IV x 1.  Daptomycin is restricted antibiotic and further doses will need to be ordered by an ID physician.  This was communicated to Dr. Marin Olp.  Ok with ordering 1 dose for tonight and have ID follow up tomorrow.    Hershal Coria 01/21/2015,6:38 PM

## 2015-01-21 NOTE — ED Notes (Signed)
Labs redrawn at this time and sent to lab. CBC, CMP and Dark Green. Dark Nyoka Cowden was labeled an d placed in mini lab.

## 2015-01-21 NOTE — Consult Note (Signed)
Reason for Consult:Dhungel MD Referring Physician: left foot diabetic ulcer with previous 2nd toe amputation with sepsis and low WBC, fever , diarrhea.  Had been on Levaquin and Sepra.   Stephen Whitaker is an 59 y.o. male.  HPI: previous ulcer right foot medial great toe healed. Has felt ill, presented with fever , chills , diarrhea.  Denies drug abuse , denies IV drugs, he states he is  divorced, straight.   Past Medical History  Diagnosis Date  . Hypertension   . Pancreatitis, acute     Past Surgical History  Procedure Laterality Date  . Tonsillectomy    . Amputation Left 07/14/2014    Procedure: 2nd Toe Amputation;  Surgeon: [name], MD;  Location: [place];  Service: Orthopedics;  Laterality: Left;    Family History  Problem Relation Age of Onset  . Adopted: Yes    Social History:  reports that he has never smoked. He does not have any smokeless tobacco history on file. He reports that he drinks alcohol. His drug history is not on file.  Allergies:  Allergies  Allergen Reactions  . Shellfish Allergy Swelling    Medications: I have reviewed the patient's current medications.  Results for orders placed or performed during the hospital encounter of 01/21/15 (from the past 48 hour(s))  CBC with Differential/Platelet     Status: Abnormal   Collection Time: 01/21/15  4:13 AM  Result Value Ref Range   WBC 0.8 (LL) 4.0 - 10.5 K/uL    Comment: REPEATED TO VERIFY CRITICAL RESULT CALLED TO, READ BACK BY AND VERIFIED WITH: ADKINS RN AT 0535 ON 06.03.16 BY SHUEA    RBC 3.49 (L) 4.22 - 5.81 MIL/uL   Hemoglobin 9.3 (L) 13.0 - 17.0 g/dL   HCT 28.0 (L) 39.0 - 52.0 %   MCV 80.2 78.0 - 100.0 fL   MCH 26.6 26.0 - 34.0 pg   MCHC 33.2 30.0 - 36.0 g/dL   RDW 13.9 11.5 - 15.5 %   Platelets 286 150 - 400 K/uL   Neutrophils Relative % 0 (L) 43 - 77 %   Lymphocytes Relative 60 (H) 12 - 46 %   Monocytes Relative 40 (H) 3 - 12 %   Eosinophils Relative 0 0 - 5 %   Basophils  Relative 0 0 - 1 %   Band Neutrophils 0 0 - 10 %   Metamyelocytes Relative 0 %   Myelocytes 0 %   Promyelocytes Absolute 0 %   Blasts 0 %   nRBC 0 0 /100 WBC   Other 0 %   Neutro Abs 0.0 (L) 1.7 - 7.7 K/uL   Lymphs Abs 0.5 (L) 0.7 - 4.0 K/uL   Monocytes Absolute 0.3 0.1 - 1.0 K/uL   Eosinophils Absolute 0.0 0.0 - 0.7 K/uL   Basophils Absolute 0.0 0.0 - 0.1 K/uL   WBC Morphology WHITE COUNT CONFIRMED ON SMEAR    Smear Review LARGE PLATELETS PRESENT   Comprehensive metabolic panel     Status: Abnormal   Collection Time: 01/21/15  4:13 AM  Result Value Ref Range   Sodium 133 (L) 135 - 145 mmol/L   Potassium 2.8 (L) 3.5 - 5.1 mmol/L   Chloride 101 101 - 111 mmol/L   CO2 22 22 - 32 mmol/L   Glucose, Bld 107 (H) 65 - 99 mg/dL   BUN 30 (H) 6 - 20 mg/dL   Creatinine, Ser 1.24 0.61 - 1.24 mg/dL   Calcium 7.4 (L) 8.9 -  10.3 mg/dL   Total Protein 6.6 6.5 - 8.1 g/dL   Albumin 2.8 (L) 3.5 - 5.0 g/dL   AST 49 (H) 15 - 41 U/L   ALT 73 (H) 17 - 63 U/L   Alkaline Phosphatase 44 38 - 126 U/L   Total Bilirubin 1.1 0.3 - 1.2 mg/dL   GFR calc non Af Amer >60 >60 mL/min   GFR calc Af Amer >60 >60 mL/min    Comment: (NOTE) The eGFR has been calculated using the CKD EPI equation. This calculation has not been validated in all clinical situations. eGFR's persistently <60 mL/min signify possible Chronic Kidney Disease.    Anion gap 10 5 - 15  Lipase, blood     Status: None   Collection Time: 01/21/15  4:13 AM  Result Value Ref Range   Lipase 23 22 - 51 U/L  I-Stat CG4 Lactic Acid, ED     Status: Abnormal   Collection Time: 01/21/15  4:52 AM  Result Value Ref Range   Lactic Acid, Venous 2.76 (HH) 0.5 - 2.0 mmol/L   Comment NOTIFIED PHYSICIAN   Urinalysis, Routine w reflex microscopic (not at [facility])     Status: Abnormal   Collection Time: 01/21/15  4:54 AM  Result Value Ref Range   Color, Urine AMBER (A) YELLOW    Comment: BIOCHEMICALS MAY BE AFFECTED BY COLOR   APPearance CLOUDY (A) CLEAR    Specific Gravity, Urine 1.019 1.005 - 1.030   pH 6.0 5.0 - 8.0   Glucose, UA NEGATIVE NEGATIVE mg/dL   Hgb urine dipstick TRACE (A) NEGATIVE   Bilirubin Urine NEGATIVE NEGATIVE   Ketones, ur NEGATIVE NEGATIVE mg/dL   Protein, ur 100 (A) NEGATIVE mg/dL   Urobilinogen, UA 1.0 0.0 - 1.0 mg/dL   Nitrite NEGATIVE NEGATIVE   Leukocytes, UA NEGATIVE NEGATIVE  Urine microscopic-add on     Status: Abnormal   Collection Time: 01/21/15  4:54 AM  Result Value Ref Range   Squamous Epithelial / LPF RARE RARE   WBC, UA 0-2 <3 WBC/hpf   RBC / HPF 0-2 <3 RBC/hpf   Bacteria, UA MANY (A) RARE   Casts GRANULAR CAST (A) NEGATIVE    Comment: HYALINE CASTS  Rapid HIV screen (HIV 1/2 Ab+Ag)     Status: None   Collection Time: 01/21/15  6:27 AM  Result Value Ref Range   HIV-1 P24 Antigen - HIV24 NON REACTIVE NON REACTIVE   HIV 1/2 Antibodies NON REACTIVE NON REACTIVE   Interpretation (HIV Ag Ab)      A non reactive test result means that HIV 1 or HIV 2 antibodies and HIV 1 p24 antigen were not detected in the specimen.  CBC with Differential     Status: Abnormal   Collection Time: 01/21/15  6:27 AM  Result Value Ref Range   WBC 0.9 (LL) 4.0 - 10.5 K/uL    Comment: REPEATED TO VERIFY CRITICAL VALUE NOTED.  VALUE IS CONSISTENT WITH PREVIOUSLY REPORTED AND CALLED VALUE.    RBC 3.83 (L) 4.22 - 5.81 MIL/uL   Hemoglobin 10.0 (L) 13.0 - 17.0 g/dL   HCT 31.0 (L) 39.0 - 52.0 %   MCV 80.9 78.0 - 100.0 fL   MCH 26.1 26.0 - 34.0 pg   MCHC 32.3 30.0 - 36.0 g/dL   RDW 13.8 11.5 - 15.5 %   Platelets  150 - 400 K/uL    PLATELET CLUMPS NOTED ON SMEAR, COUNT APPEARS ADEQUATE   Neutrophils Relative % 13 (L) 43 -  77 %   Lymphocytes Relative 54 (H) 12 - 46 %   Monocytes Relative 33 (H) 3 - 12 %   Eosinophils Relative 0 0 - 5 %   Basophils Relative 0 0 - 1 %   Neutro Abs 0.1 (L) 1.7 - 7.7 K/uL   Lymphs Abs 0.5 (L) 0.7 - 4.0 K/uL   Monocytes Absolute 0.3 0.1 - 1.0 K/uL   Eosinophils Absolute 0.0 0.0 - 0.7 K/uL    Basophils Absolute 0.0 0.0 - 0.1 K/uL   WBC Morphology WHITE COUNT CONFIRMED ON SMEAR   Save smear     Status: None   Collection Time: 01/21/15  6:27 AM  Result Value Ref Range   Smear Review SMEAR STAINED AND AVAILABLE FOR REVIEW   Troponin I     Status: None   Collection Time: 01/21/15  7:30 AM  Result Value Ref Range   Troponin I <0.03 <0.031 ng/mL    Comment:        NO INDICATION OF MYOCARDIAL INJURY.   Basic metabolic panel     Status: Abnormal   Collection Time: 01/21/15  7:30 AM  Result Value Ref Range   Sodium 133 (L) 135 - 145 mmol/L   Potassium 3.0 (L) 3.5 - 5.1 mmol/L   Chloride 103 101 - 111 mmol/L   CO2 25 22 - 32 mmol/L   Glucose, Bld 112 (H) 65 - 99 mg/dL   BUN 26 (H) 6 - 20 mg/dL   Creatinine, Ser 1.17 0.61 - 1.24 mg/dL   Calcium 7.1 (L) 8.9 - 10.3 mg/dL   GFR calc non Af Amer >60 >60 mL/min   GFR calc Af Amer >60 >60 mL/min    Comment: (NOTE) The eGFR has been calculated using the CKD EPI equation. This calculation has not been validated in all clinical situations. eGFR's persistently <60 mL/min signify possible Chronic Kidney Disease.    Anion gap 5 5 - 15  Magnesium     Status: None   Collection Time: 01/21/15  7:30 AM  Result Value Ref Range   Magnesium 1.8 1.7 - 2.4 mg/dL  Pathologist smear review     Status: None   Collection Time: 01/21/15  7:30 AM  Result Value Ref Range   Path Review Reviewed by Kalman Drape, M.D.     Comment: 6.3.16 LEUKOPENIA. NORMOCYTIC ANEMIA  WITH SCATTERED HYPOCHROMIC CELLS.   Procalcitonin - Baseline     Status: None   Collection Time: 01/21/15  7:30 AM  Result Value Ref Range   Procalcitonin 4.17 ng/mL    Comment:        Interpretation: PCT > 2 ng/mL: Systemic infection (sepsis) is likely, unless other causes are known. (NOTE)         ICU PCT Algorithm               Non ICU PCT Algorithm    ----------------------------     ------------------------------         PCT < 0.25 ng/mL                 PCT < 0.1  ng/mL     Stopping of antibiotics            Stopping of antibiotics       strongly encouraged.               strongly encouraged.    ----------------------------     ------------------------------       PCT level decrease by  PCT < 0.25 ng/mL       >= 80% from peak PCT       OR PCT 0.25 - 0.5 ng/mL          Stopping of antibiotics                                             encouraged.     Stopping of antibiotics           encouraged.    ----------------------------     ------------------------------       PCT level decrease by              PCT >= 0.25 ng/mL       < 80% from peak PCT        AND PCT >= 0.5 ng/mL            Continuing antibiotics                                               encouraged.       Continuing antibiotics            encouraged.    ----------------------------     ------------------------------     PCT level increase compared          PCT > 0.5 ng/mL         with peak PCT AND          PCT >= 0.5 ng/mL             Escalation of antibiotics                                          strongly encouraged.      Escalation of antibiotics        strongly encouraged.   Sedimentation rate     Status: Abnormal   Collection Time: 01/21/15  7:30 AM  Result Value Ref Range   Sed Rate 80 (H) 0 - 16 mm/hr  HIV antibody     Status: None   Collection Time: 01/21/15  7:30 AM  Result Value Ref Range   HIV Screen 4th Generation wRfx Non Reactive Non Reactive    Comment: (NOTE) Performed At: [facility] [address] [place], [place] [id number] Lindon Romp MD [id number]   Lipase, blood     Status: Abnormal   Collection Time: 01/21/15  7:30 AM  Result Value Ref Range   Lipase 115 (H) 22 - 51 U/L  ABO/Rh     Status: None   Collection Time: 01/21/15  7:33 AM  Result Value Ref Range   ABO/RH(D) O POS   Occult blood card to lab, stool RN will collect     Status: None   Collection Time: 01/21/15  8:00 AM  Result Value Ref Range   Fecal  Occult Bld NEGATIVE NEGATIVE  Type and screen     Status: None   Collection Time: 01/21/15  9:45 AM  Result Value Ref Range   ABO/RH(D) O POS    Antibody Screen NEG    Sample Expiration 01/24/2015   Lactate dehydrogenase  Status: None   Collection Time: 01/21/15  9:45 AM  Result Value Ref Range   LDH 178 98 - 192 U/L  Cortisol     Status: None   Collection Time: 01/21/15  9:45 AM  Result Value Ref Range   Cortisol, Plasma 15.4 ug/dL    Comment: (NOTE) AM    6.7 - 22.6 ug/dL PM   <10.0       ug/dL Performed at [place]     Status: Abnormal   Collection Time: 01/21/15  9:45 AM  Result Value Ref Range   Prothrombin Time 19.2 (H) 11.6 - 15.2 seconds   INR 1.61 (H) 0.00 - 1.49  TSH     Status: None   Collection Time: 01/21/15  9:45 AM  Result Value Ref Range   TSH 0.633 0.350 - 4.500 uIU/mL  Iron and TIBC     Status: Abnormal   Collection Time: 01/21/15  9:45 AM  Result Value Ref Range   Iron 13 (L) 45 - 182 ug/dL   TIBC 181 (L) 250 - 450 ug/dL   Saturation Ratios 7 (L) 17.9 - 39.5 %   UIBC 168 ug/dL    Comment: Performed at [facility]  Ferritin     Status: Abnormal   Collection Time: 01/21/15  9:45 AM  Result Value Ref Range   Ferritin 577 (H) 24 - 336 ng/mL    Comment: Performed at [facility]  Vitamin B12     Status: Abnormal   Collection Time: 01/21/15  9:45 AM  Result Value Ref Range   Vitamin B-12 915 (H) 180 - 914 pg/mL    Comment: (NOTE) This assay is not validated for testing neonatal or myeloproliferative syndrome specimens for Vitamin B12 levels. Performed at [facility]   Lactic acid, plasma     Status: None   Collection Time: 01/21/15  5:18 PM  Result Value Ref Range   Lactic Acid, Venous 0.8 0.5 - 2.0 mmol/L    Ct Head Wo Contrast  01/21/2015   CLINICAL DATA:  Nausea, vomiting, syncope, history hypertension, pancreatitis, chills, weakness, hypotensive at presentation  EXAM: CT HEAD WITHOUT CONTRAST   TECHNIQUE: Contiguous axial images were obtained from the base of the skull through the vertex without intravenous contrast.  COMPARISON:  None  FINDINGS: Mild atrophy.  Normal ventricular morphology.  No midline shift or mass effect.  Normal appearance of brain parenchyma.  Streak artifacts at skull base, for which repeat imaging was performed.  No intracranial hemorrhage, mass lesion or evidence acute infarction.  No extra-axial fluid collections.  Bones and sinuses unremarkable.  IMPRESSION: No acute intracranial abnormalities.   Electronically Signed   By: Lavonia Dana M.D.   On: 01/21/2015 12:36   Ct Abdomen Pelvis W Contrast  01/21/2015   CLINICAL DATA:  59 year old next the with history of hypertension, pancreatitis, presents with nausea, vomiting, syncope. Epigastric pain, worsening with vomiting. Diarrhea in chills.  EXAM: CT ABDOMEN AND PELVIS WITH CONTRAST  TECHNIQUE: Multidetector CT imaging of the abdomen and pelvis was performed using the standard protocol following bolus administration of intravenous contrast.  CONTRAST:  170m OMNIPAQUE IOHEXOL 300 MG/ML  SOLN  COMPARISON:  09/20/2003  FINDINGS: Lower chest: Dependent opacities in the lung bases, likely atelectasis. No effusions. Heart is normal size.  Hepatobiliary: No biliary ductal dilatation or focal hepatic lesion. Gallbladder grossly unremarkable.  Pancreas: No focal pancreatic abnormality. Small amount of fluid adjacent to the pancreatic  tail in the left anterior para renal space. Small amount of fluid also noted in the right anterior para renal space.  Spleen: No focal abnormality.  Normal size.  Adrenals/Urinary Tract: Small cysts in the upper pole and midpole of the right kidney. No hydronephrosis. No renal or ureteral stones. Adrenal glands and urinary bladder unremarkable. Foley catheter in place within the bladder.  Stomach/Bowel: Stomach, large and small bowel grossly unremarkable. Appendix is visualized and unremarkable.   Vascular/Lymphatic: No retroperitoneal or mesenteric adenopathy. Aorta normal caliber.  Reproductive: No mass or other significant abnormality.  Other: No free fluid or free air.  Musculoskeletal: No focal bone lesion or acute bony abnormality.  : Small amount of fluid within the anterior para renal spaces bilaterally, including adjacent to the pancreatic tail. While no definite focal pancreatic process or inflammatory change around the pancreas is noted, this could reflect changes of early acute pancreatitis. Recommend clinical correlation.  Small right renal cysts.  Posterior bibasilar opacities, likely atelectasis.   Electronically Signed   By: Rolm Baptise M.D.   On: 01/21/2015 12:33   Mr Foot Left Wo Contrast  01/21/2015   CLINICAL DATA:  Ulceration on the plantar aspect of the great toe. Elevated white blood cell count and fever. Initial encounter  EXAM: MRI OF THE LEFT FOREFOOT WITHOUT CONTRAST  TECHNIQUE: Multiplanar, multisequence MR imaging was performed. No intravenous contrast was administered.  COMPARISON:  Plain films of the left great toe 01/2002/ 2016 and plain films left foot 06/14/2014.  FINDINGS: There appears to be a skin ulceration on the plantar surface of the great toe just distal to the IP joint. There is marrow edema in the distal 1.4 cm of the proximal phalanx of the great toe eccentric on the medial side. The small cortical defect seen on the most recent plain films appears well-marginated without adjacent edema.  There is first MTP joint space narrowing with mild subchondral edema in the central head of the first metatarsal consistent with osteoarthritis. Bone marrow signal is otherwise unremarkable. No fluid collection is identified. Intrinsic musculature the foot is somewhat atrophic.  IMPRESSION: Skin ulceration appears to be on the plantar surface of the great toe at or just distal to the IP joint. There is no abscess. Edema in the distal 1.4 cm of the proximal phalanx of the great  toe could be secondary to osteomyelitis, inflammatory change or stress change.  Mild appearing first MTP osteoarthritis.   Electronically Signed   By: Inge Rise M.D.   On: 01/21/2015 13:21   Dg Chest Portable 1 View  01/21/2015   CLINICAL DATA:  Vomiting for several days, weakness and dehydration.  EXAM: PORTABLE CHEST - 1 VIEW  COMPARISON:  11/13/2014  FINDINGS: Lung volumes are low. Minimal linear atelectasis at the lung bases, left greater than right. The cardiomediastinal contours are normal. Pulmonary vasculature is normal for technique. No consolidation, pleural effusion, or pneumothorax. No acute osseous abnormalities are seen.  IMPRESSION: Hypoventilatory chest with bibasilar atelectasis.   Electronically Signed   By: Jeb Levering M.D.   On: 01/21/2015 02:46   Dg Toe Great Left  01/21/2015   CLINICAL DATA:  Ulceration at the plantar aspect of the great toe. Initial encounter.  EXAM: LEFT GREAT TOE  COMPARISON:  Left foot radiographs performed 06/14/2014  FINDINGS: The known soft tissue ulceration is difficult to fully characterize on radiograph. No radiopaque foreign bodies are seen. Scattered soft tissue calcifications are suggested at the great toe. There is suggestion of  a small erosion at the medial distal aspect of the first proximal phalanx.  The visualized joint spaces are preserved. The patient is status post amputation of the distal second metatarsal. No osseous erosions are  IMPRESSION: 1. Suggestion of a small erosion at the medial distal aspect of the first proximal phalanx; osteomyelitis is a concern. MRI could be considered for further evaluation as deemed clinically appropriate. 2. Soft tissue ulceration not well characterized on radiograph. No radiopaque foreign bodies seen. Scattered soft calcifications suggested at the great toe.   Electronically Signed   By: Garald Balding M.D.   On: 01/21/2015 02:42    Review of Systems  Constitutional: Positive for fever, chills and  malaise/fatigue.  Respiratory: Negative for cough.   Gastrointestinal: Positive for diarrhea.  Musculoskeletal: Positive for joint pain.       Pos. Hx of gout  Neurological: Positive for sensory change.   Blood pressure 106/57, pulse 106, temperature 101.1 F (38.4 C), temperature source Oral, resp. rate 18, height 6' 2.5" (1.892 m), weight 80.3 kg (177 lb 0.5 oz), SpO2 100 %. Physical Exam  Constitutional: He is oriented to person, place, and time.  Thin , alert  HENT:  Head: Normocephalic.  Neck: Normal range of motion.  Cardiovascular: Normal rate.   Respiratory: Effort normal.  Musculoskeletal:  Left great toe ulcer plantar toe. No tendon visible.  Right foot no ulceration  Neurological: He is alert and oriented to person, place, and time.  Skin: Skin is warm.  Psychiatric: He has a normal mood and affect. His behavior is normal.    Assessment/Plan: xrays of toe and MRI do not show definite osteomyelitis. It may be early. Agree with IV ABX , will follow.   Continue daily dressing changes.   YATES,MARK C 01/21/2015, 8:24 PM

## 2015-01-21 NOTE — ED Notes (Signed)
Patient arrived via EMS after having N/V/D for several hours.  He had a syncopal episode at a convenience store.  EMS reports that his initial BP was 80/40.  Medicated with Zofran 4mg  IV en route.  Patient reports that he had surgery on his left foot seven months PTA and states he now has an ulcer on that foot.

## 2015-01-21 NOTE — Procedures (Signed)
ELECTROENCEPHALOGRAM REPORT  Date of Study: 01/21/2015  Patient's Name: Stephen Whitaker MRN: [id number] Date of Birth: [birth year]  Referring Provider: Dr. Flonnie Overman Dhungel  Clinical History: This is a 59 year old man with an episode of loss of consciousness and weakness.  Medications: acetaminophen (TYLENOL) tablet 650 mg albuterol (PROVENTIL) (2.5 MG/3ML) 0.083% nebulizer solution 3 mL allopurinol (ZYLOPRIM) tablet 100 mg fluticasone (FLONASE) 50 MCG/ACT nasal spray 1 spray lipase/protease/amylase (CREON) capsule 24,000 Units loratadine (CLARITIN) tablet 10 mg mometasone-formoterol (DULERA) 200-5 MCG/ACT inhaler 2 puff piperacillin-tazobactam (ZOSYN) IVPB 3.375 g vancomycin (VANCOCIN) IVPB 1000 mg/200 mL premix  Technical Summary: A multichannel digital EEG recording measured by the international 10-20 system with electrodes applied with paste and impedances below 5000 ohms performed in our laboratory with EKG monitoring in an awake and drowsy patient.  Hyperventilation and photic stimulation were not performed.  The digital EEG was referentially recorded, reformatted, and digitally filtered in a variety of bipolar and referential montages for optimal display.    Description: The patient is awake and drowsy during the recording.  During maximal wakefulness, there is a symmetric, medium voltage 9-10 Hz posterior dominant rhythm that attenuates with eye opening.  The record is symmetric.  During drowsiness, there is an increase in theta slowing of the background.  Deeper stages of sleep were not seen. Hyperventilation and photic stimulation were not performed.  There were no epileptiform discharges or electrographic seizures seen.    EKG lead was unremarkable.  Impression: This awake and drowsy EEG is normal.    Clinical Correlation: A normal EEG does not exclude a clinical diagnosis of epilepsy. Clinical correlation is advised.   Ellouise Newer, M.D.

## 2015-01-21 NOTE — H&P (Signed)
[facility] History and Physical  [name] JTT:[id number] DOB: 09/01/1955 DOA: 01/21/2015  Referring physician: Dr.Horton. PCP: Angelica Chessman, MD  Specialists: None.  Chief Complaint: Loss of consciousness and weakness.  HPI: Stephen Whitaker is a 59 y.o. male with history of hypertension, COPD, chronic pancreatitis and has had previous amputation of left foot second toe presents to the ER because of loss of consciousness. Patient states he has had a ulcer on his left great toe for which she has been on antibiotics for 3 weeks which he finished a week ago. Initially he was feeling constipated but over the last 2 days he has been having multiple episodes of diarrhea with couple of episodes of vomiting. He still feels nauseated and has some mild epigastric discomfort. Over the last 2 days he has hardly eaten anything and last night around 1 AM he had gone to get some food when he felt weak and lost consciousness at the stool. Patient states he may have lost consciousness for a couple of minutes. Patient was brought to the ER by EMS. On arrival patient was hypotensive and was given 3 L fluid bolus following which his blood pressure improved and labs show lactic acid elevated after the fluid bolus. At this time patient still feels weak denies any chest pain or shortness of breath abdomen appears benign. Patient denies any headache and appears nonfocal. Patient is still febrile. Patient's blood count shows low white blood cell count and anemia. X-rays show possible osteomyelitis of the left great toe.   Review of Systems: As presented in the history of presenting illness, rest negative.  Past Medical History  Diagnosis Date  . Hypertension   . Pancreatitis, acute    Past Surgical History  Procedure Laterality Date  . Tonsillectomy    . Amputation Left 07/14/2014    Procedure: 2nd Toe Amputation;  Surgeon: [name], MD;  Location: [place];  Service: Orthopedics;   Laterality: Left;   Social History:  reports that he has never smoked. He does not have any smokeless tobacco history on file. He reports that he drinks alcohol. His drug history is not on file. Where does patient live home. Can patient participate in ADLs? Yes.  Allergies  Allergen Reactions  . Shellfish Allergy Swelling    Family History:  Family History  Problem Relation Age of Onset  . Adopted: Yes      Prior to Admission medications   Medication Sig Start Date End Date Taking? Authorizing Provider  albuterol (PROVENTIL HFA;VENTOLIN HFA) 108 (90 BASE) MCG/ACT inhaler Inhale 2 puffs into the lungs every 6 (six) hours as needed for wheezing or shortness of breath.   Yes Historical Provider, MD  allopurinol (ZYLOPRIM) 100 MG tablet Take 100 mg by mouth daily.  07/19/14  Yes Historical Provider, MD  Bioflavonoid Products (ESTER C PO) Take 1 tablet by mouth daily.   Yes Historical Provider, MD  clobetasol ointment (TEMOVATE) 0.09 % Apply 1 application topically 2 (two) times daily. 11/05/14  Yes Tresa Garter, MD  Digestive Enzymes (DIGESTIVE ENZYME PO) Take 1 capsule by mouth 3 (three) times daily with meals. [place] brand   Yes Historical Provider, MD  fluticasone (FLONASE) 50 MCG/ACT nasal spray Place 1 spray into both nostrils daily. 12/29/14  Yes Historical Provider, MD  Fluticasone-Salmeterol (ADVAIR) 500-50 MCG/DOSE AEPB Inhale 1 puff into the lungs 2 (two) times daily. 10/21/14  Yes Tresa Garter, MD  hydrochlorothiazide (HYDRODIURIL) 25 MG tablet Take 1 tablet (25 mg  total) by mouth daily. 08/10/14  Yes Tresa Garter, MD  ibuprofen (ADVIL,MOTRIN) 200 MG tablet Take 800 mg by mouth every 6 (six) hours as needed for moderate pain.   Yes Historical Provider, MD  loratadine (CLARITIN) 10 MG tablet Take 10 mg by mouth daily.   Yes Historical Provider, MD  Multiple Vitamin (MULTIVITAMIN WITH MINERALS) TABS tablet Take 1 tablet by mouth daily.   Yes Historical Provider, MD   OVER THE COUNTER MEDICATION Take 1 capsule by mouth 3 (three) times daily with meals. Blue/green algae   Yes Historical Provider, MD  Pancrelipase, Lip-Prot-Amyl, (CREON) 24000 UNITS CPEP Take 1 capsule (24,000 Units total) by mouth daily. 08/10/14  Yes Tresa Garter, MD  pantoprazole (PROTONIX) 40 MG tablet Take 1 tablet (40 mg total) by mouth daily. 08/10/14  Yes Tresa Garter, MD  ibuprofen (ADVIL,MOTRIN) 800 MG tablet Take 1 tablet (800 mg total) by mouth every 8 (eight) hours as needed for mild pain. Patient not taking: Reported on 10/21/2014 08/10/14   Tresa Garter, MD  levofloxacin (LEVAQUIN) 500 MG tablet Take 1 tablet (500 mg total) by mouth daily. Patient not taking: Reported on 01/21/2015 11/13/14   Melony Overly, MD  predniSONE (DELTASONE) 50 MG tablet Take 1 pill daily for 5 days. Patient not taking: Reported on 01/21/2015 11/13/14   Melony Overly, MD  sulfamethoxazole-trimethoprim (BACTRIM DS,SEPTRA DS) 800-160 MG per tablet Take 2 tablets by mouth 2 (two) times daily. Patient not taking: Reported on 01/21/2015 12/30/14   Gregor Hams, MD    Physical Exam: Filed Vitals:   01/21/15 0500 01/21/15 0502 01/21/15 0546 01/21/15 0649  BP: 121/61 121/61 121/65 98/58  Pulse:  97 100 105  Temp:    101.5 F (38.6 C)  TempSrc:    Oral  Resp: 21 13 18 16   Height:    6' 2.5" (1.892 m)  Weight:    80.3 kg (177 lb 0.5 oz)  SpO2:  98% 96% 100%     General:  Moderately built and poorly nourished.  Eyes: Anicteric. No pallor.  ENT: No discharge from ears eyes nose and mouth.  Neck: No mass felt.  Cardiovascular: S1 and S2 heard.  Respiratory: No rhonchi or crepitations.  Abdomen: Soft nontender bowel sounds present.  Skin: Left foot great toe has a small ulcer with no active discharge.  Musculoskeletal: No edema.  Psychiatric: Appears normal.  Neurologic: Alert awake oriented to time place and person. Moves all extremities. For deep tendon reflexes.  Labs on  Admission:  Basic Metabolic Panel:  Recent Labs Lab 01/21/15 0413  NA 133*  K 2.8*  CL 101  CO2 22  GLUCOSE 107*  BUN 30*  CREATININE 1.24  CALCIUM 7.4*   Liver Function Tests:  Recent Labs Lab 01/21/15 0413  AST 49*  ALT 73*  ALKPHOS 44  BILITOT 1.1  PROT 6.6  ALBUMIN 2.8*    Recent Labs Lab 01/21/15 0413  LIPASE 23   No results for input(s): AMMONIA in the last 168 hours. CBC:  Recent Labs Lab 01/21/15 0413 01/21/15 0627  WBC 0.8* 0.9*  NEUTROABS 0.0* 0.1*  HGB 9.3* 10.0*  HCT 28.0* 31.0*  MCV 80.2 80.9  PLT 286 PLATELET CLUMPS NOTED ON SMEAR, COUNT APPEARS ADEQUATE   Cardiac Enzymes: No results for input(s): CKTOTAL, CKMB, CKMBINDEX, TROPONINI in the last 168 hours.  BNP (last 3 results) No results for input(s): BNP in the last 8760 hours.  ProBNP (last 3 results) No  results for input(s): PROBNP in the last 8760 hours.  CBG: No results for input(s): GLUCAP in the last 168 hours.  Radiological Exams on Admission: Dg Chest Portable 1 View  01/21/2015   CLINICAL DATA:  Vomiting for several days, weakness and dehydration.  EXAM: PORTABLE CHEST - 1 VIEW  COMPARISON:  11/13/2014  FINDINGS: Lung volumes are low. Minimal linear atelectasis at the lung bases, left greater than right. The cardiomediastinal contours are normal. Pulmonary vasculature is normal for technique. No consolidation, pleural effusion, or pneumothorax. No acute osseous abnormalities are seen.  IMPRESSION: Hypoventilatory chest with bibasilar atelectasis.   Electronically Signed   By: Jeb Levering M.D.   On: 01/21/2015 02:46   Dg Toe Great Left  01/21/2015   CLINICAL DATA:  Ulceration at the plantar aspect of the great toe. Initial encounter.  EXAM: LEFT GREAT TOE  COMPARISON:  Left foot radiographs performed 06/14/2014  FINDINGS: The known soft tissue ulceration is difficult to fully characterize on radiograph. No radiopaque foreign bodies are seen. Scattered soft tissue calcifications  are suggested at the great toe. There is suggestion of a small erosion at the medial distal aspect of the first proximal phalanx.  The visualized joint spaces are preserved. The patient is status post amputation of the distal second metatarsal. No osseous erosions are  IMPRESSION: 1. Suggestion of a small erosion at the medial distal aspect of the first proximal phalanx; osteomyelitis is a concern. MRI could be considered for further evaluation as deemed clinically appropriate. 2. Soft tissue ulceration not well characterized on radiograph. No radiopaque foreign bodies seen. Scattered soft calcifications suggested at the great toe.   Electronically Signed   By: Garald Balding M.D.   On: 01/21/2015 02:42    EKG: Independently reviewed. Sinus tachycardia.  Assessment/Plan Principal Problem:   Sepsis Active Problems:   Leucopenia   Anemia   Syncope   Diarrhea   Hypokalemia   Osteomyelitis   1. Sepsis - source could be either intra-abdominal secondary to colitis or related to osteomyelitis. Patient has been empirically placed on vancomycin and cefepime. Check blood cultures urine cultures stool studies including C. difficile. Patient has received 3 L normal saline bolus and I have ordered to monitor bolus. Patient will need to be admitted to stepdown unit for now. Follow lactic acid levels and procalcitonin levels. I have also ordered CT head and CT abdomen and pelvis. 2. Leukopenia and anemia - may be related to sepsis. Check LDH for any hemolytic process. Patient's leukopenia and anemia could also be related to patient's recent use of Bactrim. I have ordered for smear studies and LDH. Closely follow CBC. Patient will be to be on neutropenic precautions. Check HIV. May need hematology consult. Check stool for occult blood. Type and screen. 3. Osteomyelitis of left great toe - check sedimentation rate. Will need MRI of the left foot once patient is more stable. Patient is on empiric  antibiotics. 4. Hypokalemia probably from diarrhea - replace and recheck. Check magnesium levels. 5. Syncope probably from hypotension and dehydration - CT head is pending cardiac markers are pending. Check 2-D echo. Continue hydration. 6. History of hypertension presently hypotensive so holding off antihypertensives. 7. History of chronic bronchitis presently not wheezing. Chest x-ray is pending. 8. History of chronic pancreatitis - CT abdomen and pelvis is pending.  Patient will be admitted to stepdown unit due to persistent hypotension. Chest x-ray CT head CT abdomen and pelvis are pending. Stool for blood results are pending.  Discussed  with oncoming hospitalist Dr. Clementeen Graham.   DVT Prophylaxis SCDs.  Code Status: Full code.   Family Communication: Discussed with patient.  Disposition Plan: Admit to inpatient.    Mirta Mally N. [facility] Pager [phone].  If 7PM-7AM, please contact night-coverage [email or web address] Password [facility] 01/21/2015, 7:37 AM

## 2015-01-21 NOTE — Consult Note (Signed)
WOC wound consult note Reason for Consult: Left great toe ulceration.  Started as blister, per patient.  Amputation of second toe noted.  Wound type: Trauma Pressure Ulcer POA: Yes Measurement: 0.5 cm x 0.5 cm x 0.2 cm Wound EFE:OFHQ pink Drainage (amount, consistency, odor) Minimal serosanguinous drainage.  No odor.  Periwound:Calloused Dressing procedure/placement/frequency:Cleanse ulcer to left great toe with NS and pat gently dry.  Apply Bacitracin ointment to wound bed.  Cover with 2x2 and tape.  Change daily.  Will not follow at this time.  Please re-consult if needed.  Domenic Moras RN BSN [place] Pager [phone]

## 2015-01-21 NOTE — Progress Notes (Addendum)
ANTIBIOTIC CONSULT NOTE - INITIAL  Pharmacy Consult for Vancomycin Indication: Sepsis  Allergies  Allergen Reactions  . Shellfish Allergy Swelling    Patient Measurements:   Wt=87 kg  Vital Signs: Temp: 100.8 F (38.2 C) (06/03 0147) Temp Source: Oral (06/03 0147) BP: 121/65 mmHg (06/03 0546) Pulse Rate: 100 (06/03 0546) Intake/Output from previous day: 06/02 0701 - 06/03 0700 In: -  Out: 580 [Urine:580] Intake/Output from this shift: Total I/O In: -  Out: 580 [Urine:580]  Labs:  Recent Labs  01/21/15 0413  WBC 0.8*  HGB 9.3*  PLT 286  CREATININE 1.24   CrCl cannot be calculated (Unknown ideal weight.). No results for input(s): VANCOTROUGH, VANCOPEAK, VANCORANDOM, GENTTROUGH, GENTPEAK, GENTRANDOM, TOBRATROUGH, TOBRAPEAK, TOBRARND, AMIKACINPEAK, AMIKACINTROU, AMIKACIN in the last 72 hours.   Microbiology: No results found for this or any previous visit (from the past 720 hour(s)).  Medical History: Past Medical History  Diagnosis Date  . Hypertension   . Pancreatitis, acute     Medications:   (Not in a hospital admission) Scheduled:   Infusions:  . potassium chloride    . vancomycin     Assessment: 1 yoM c/o n/v/d x several hours s/p syncopal episode at [employer].  Initial BP 80/40.  Vancomycin per Rx for Sepsis.   Goal of Therapy:  Vancomycin trough level 15-20 mcg/ml  Plan:   Vancomycin 1Gm IV q12h  F/u SCr/cultures/levels  Lawana Pai R 01/21/2015,6:15 AM   Adden: To add zosyn to abx regimen for suspected sepsis.  Will start zosyn 3.375 gm IV q8h (infuse over 4 hours). Dia Sitter, PharmD, BCPS 01/21/2015 7:49 AM

## 2015-01-22 DIAGNOSIS — L97509 Non-pressure chronic ulcer of other part of unspecified foot with unspecified severity: Secondary | ICD-10-CM | POA: Diagnosis present

## 2015-01-22 LAB — CBC WITH DIFFERENTIAL/PLATELET
BASOS ABS: 0 10*3/uL (ref 0.0–0.1)
Basophils Relative: 0 % (ref 0–1)
EOS ABS: 0 10*3/uL (ref 0.0–0.7)
Eosinophils Relative: 0 % (ref 0–5)
HEMATOCRIT: 27.7 % — AB (ref 39.0–52.0)
Hemoglobin: 8.8 g/dL — ABNORMAL LOW (ref 13.0–17.0)
Lymphocytes Relative: 54 % — ABNORMAL HIGH (ref 12–46)
Lymphs Abs: 0.8 10*3/uL (ref 0.7–4.0)
MCH: 25.6 pg — ABNORMAL LOW (ref 26.0–34.0)
MCHC: 31.8 g/dL (ref 30.0–36.0)
MCV: 80.5 fL (ref 78.0–100.0)
Monocytes Absolute: 0.5 10*3/uL (ref 0.1–1.0)
Monocytes Relative: 37 % — ABNORMAL HIGH (ref 3–12)
NEUTROS ABS: 0.1 10*3/uL — AB (ref 1.7–7.7)
Neutrophils Relative %: 9 % — ABNORMAL LOW (ref 43–77)
PLATELETS: 262 10*3/uL (ref 150–400)
RBC: 3.44 MIL/uL — AB (ref 4.22–5.81)
RDW: 14.1 % (ref 11.5–15.5)
WBC: 1.4 10*3/uL — CL (ref 4.0–10.5)

## 2015-01-22 LAB — COMPREHENSIVE METABOLIC PANEL
ALK PHOS: 42 U/L (ref 38–126)
ALT: 58 U/L (ref 17–63)
ANION GAP: 6 (ref 5–15)
AST: 37 U/L (ref 15–41)
Albumin: 2.5 g/dL — ABNORMAL LOW (ref 3.5–5.0)
BUN: 14 mg/dL (ref 6–20)
CO2: 22 mmol/L (ref 22–32)
Calcium: 7.5 mg/dL — ABNORMAL LOW (ref 8.9–10.3)
Chloride: 107 mmol/L (ref 101–111)
Creatinine, Ser: 0.92 mg/dL (ref 0.61–1.24)
GFR calc non Af Amer: >60 mL/min (ref >60)
Glucose, Bld: 95 mg/dL (ref 65–99)
POTASSIUM: 3.4 mmol/L — AB (ref 3.5–5.1)
Sodium: 135 mmol/L (ref 135–145)
Total Bilirubin: 1 mg/dL (ref 0.3–1.2)
Total Protein: 6.3 g/dL — ABNORMAL LOW (ref 6.5–8.1)

## 2015-01-22 LAB — URINE CULTURE
Colony Count: NO GROWTH
Culture: NO GROWTH

## 2015-01-22 LAB — CLOSTRIDIUM DIFFICILE BY PCR: Toxigenic C. Difficile by PCR: NEGATIVE

## 2015-01-22 LAB — CK: Total CK: 359 U/L (ref 49–397)

## 2015-01-22 MED ORDER — SODIUM CHLORIDE 0.9 % IV SOLN
500.0000 mg | Freq: Once | INTRAVENOUS | Status: AC
  Administered 2015-01-22: 500 mg via INTRAVENOUS
  Filled 2015-01-22: qty 10

## 2015-01-22 MED ORDER — SODIUM CHLORIDE 0.9 % IV SOLN
510.0000 mg | Freq: Once | INTRAVENOUS | Status: AC
  Administered 2015-01-22: 510 mg via INTRAVENOUS
  Filled 2015-01-22: qty 17

## 2015-01-22 MED ORDER — PANTOPRAZOLE SODIUM 40 MG PO TBEC
40.0000 mg | DELAYED_RELEASE_TABLET | Freq: Every day | ORAL | Status: DC
  Administered 2015-01-22 – 2015-01-24 (×3): 40 mg via ORAL
  Filled 2015-01-22 (×3): qty 1

## 2015-01-22 NOTE — Consult Note (Signed)
[facility]  Total days of antibiotics 3        Day 3 vanco        Day 3 piptazo               Reason for Consult: febrile neutropenia/osteo    Referring Physician: [email or web address]  Principal Problem:   Sepsis Active Problems:   Leucopenia   Anemia   Syncope   Diarrhea   Hypokalemia   Osteomyelitis   Pressure ulcer   Blood poisoning   Ulcer of great toe    HPI: Stephen Whitaker is a 59 y.o. male with known history of leukopenia, htn, chronic pancreatitis who has had a non-healing ulcer to left great toe. He was seen in the urgent care center roughly mid May and received a course of bactrim which he finished roughly 1 week ago. He states that drainage from this great toe improved but he also noted to be constipated. Days prior to admit, he had poor po intake. He was admitted on 6/2 for fever, altered mental status found to have hypotension requiring to 3L IVF. His labs reviewed a total WBC of 0.9 but ANC of 0. Infectious work up revealed possible osteomyelitis for foot ulcer. He was also seen by Dr. Marin Olp to evaluate for neutropenia. He still remains febrile. He was given a dose of GCSF on 6/3 to increase his WBC to 1.4.   Past Medical History  Diagnosis Date  . Hypertension   . Pancreatitis, acute     Allergies:  Allergies  Allergen Reactions  . Shellfish Allergy Swelling    MEDICATIONS: . allopurinol  100 mg Oral Daily  . bacitracin   Topical Daily  . fluticasone  1 spray Each Nare Daily  . lipase/protease/amylase  24,000 Units Oral Daily  . loratadine  10 mg Oral Daily  . mometasone-formoterol  2 puff Inhalation BID  . pantoprazole  40 mg Oral Daily  . piperacillin-tazobactam (ZOSYN)  IV  3.375 g Intravenous Q8H  . sodium chloride  1,000 mL Intravenous Once    History  Substance Use Topics  . Smoking status: Never Smoker   . Smokeless tobacco: Not on file  . Alcohol Use: Yes    Family History  Problem Relation Age of Onset  .  Adopted: Yes    Review of Systems - Review of Systems  Constitutional: +for fever, chills, diaphoresis, activity change, appetite change, fatigue and unexpected weight change.  HENT: Negative for congestion, sore throat, rhinorrhea, sneezing, trouble swallowing and sinus pressure.  Eyes: Negative for photophobia and visual disturbance.  Respiratory: Negative for cough, chest tightness, shortness of breath, wheezing and stridor.  Cardiovascular: Negative for chest pain, palpitations and leg swelling.  Gastrointestinal: + diarrhea, Negative for nausea, vomiting, abdominal pain, diarrhea, constipation, blood in stool, abdominal distention and anal bleeding.  Genitourinary: Negative for dysuria, hematuria, flank pain and difficulty urinating.  Musculoskeletal: Negative for myalgias, back pain, joint swelling, arthralgias and gait problem.  Skin: Negative for color change, pallor, rash and wound.  Neurological: Negative for dizziness, tremors, weakness and light-headedness.  Hematological: Negative for adenopathy. Does not bruise/bleed easily.  Psychiatric/Behavioral: Negative for behavioral problems, confusion, sleep disturbance, dysphoric mood, decreased concentration and agitation.     OBJECTIVE: Temp:  [99.7 F (37.6 C)-101.3 F (38.5 C)] 100.5 F (38.1 C) (06/04 1416) Pulse Rate:  [95-100] 100 (06/04 1416) Resp:  [16-20] 20 (06/04 1416) BP: (109-119)/(60-68) 114/60 mmHg (06/04 1416) SpO2:  [100 %] 100 % (  06/04 1416) Weight:  [184 lb 6.4 oz (83.643 kg)] 184 lb 6.4 oz (83.643 kg) (06/04 5697) Physical Exam  Constitutional: He is oriented to person, place, and time. He appears frail. No distress.  HENT:  Mouth/Throat: Oropharynx is clear and moist. No oropharyngeal exudate.  Cardiovascular: Normal rate, regular rhythm and normal heart sounds. Exam reveals no gallop and no friction rub.  No murmur heard.  Pulmonary/Chest: Effort normal and breath sounds normal. No respiratory  distress. He has no wheezes.  Abdominal: Soft. Bowel sounds are normal. He exhibits no distension. There is no tenderness.  Lymphadenopathy:  He has no cervical adenopathy.  Neurological: He is alert and oriented to person, place, and time.  Skin: left great toe, plantar ulcer 1.5 cm wide, hyperkeratotic rim Psychiatric: He has a normal mood and affect. His behavior is normal.      LABS: Results for orders placed or performed during the hospital encounter of 01/21/15 (from the past 48 hour(s))  Blood culture (routine x 2)     Status: None (Preliminary result)   Collection Time: 01/21/15  2:19 AM  Result Value Ref Range   Specimen Description BLOOD RAC    Special Requests BOTTLES DRAWN AEROBIC AND ANAEROBIC 5CC    Culture             BLOOD CULTURE RECEIVED NO GROWTH TO DATE CULTURE WILL BE HELD FOR 5 DAYS BEFORE ISSUING A FINAL NEGATIVE REPORT Performed at [facility]    Report Status PENDING   Blood culture (routine x 2)     Status: None (Preliminary result)   Collection Time: 01/21/15  3:13 AM  Result Value Ref Range   Specimen Description BLOOD LEFT HAND    Special Requests BOTTLES DRAWN AEROBIC AND ANAEROBIC 5ML    Culture             BLOOD CULTURE RECEIVED NO GROWTH TO DATE CULTURE WILL BE HELD FOR 5 DAYS BEFORE ISSUING A FINAL NEGATIVE REPORT Performed at [facility]    Report Status PENDING   CBC with Differential/Platelet     Status: Abnormal   Collection Time: 01/21/15  4:13 AM  Result Value Ref Range   WBC 0.8 (LL) 4.0 - 10.5 K/uL    Comment: REPEATED TO VERIFY CRITICAL RESULT CALLED TO, READ BACK BY AND VERIFIED WITH: ADKINS RN AT 0535 ON 06.03.16 BY SHUEA    RBC 3.49 (L) 4.22 - 5.81 MIL/uL   Hemoglobin 9.3 (L) 13.0 - 17.0 g/dL   HCT 28.0 (L) 39.0 - 52.0 %   MCV 80.2 78.0 - 100.0 fL   MCH 26.6 26.0 - 34.0 pg   MCHC 33.2 30.0 - 36.0 g/dL   RDW 13.9 11.5 - 15.5 %   Platelets 286 150 - 400 K/uL   Neutrophils Relative % 0 (L) 43 - 77 %    Lymphocytes Relative 60 (H) 12 - 46 %   Monocytes Relative 40 (H) 3 - 12 %   Eosinophils Relative 0 0 - 5 %   Basophils Relative 0 0 - 1 %   Band Neutrophils 0 0 - 10 %   Metamyelocytes Relative 0 %   Myelocytes 0 %   Promyelocytes Absolute 0 %   Blasts 0 %   nRBC 0 0 /100 WBC   Other 0 %   Neutro Abs 0.0 (L) 1.7 - 7.7 K/uL   Lymphs Abs 0.5 (L) 0.7 - 4.0 K/uL   Monocytes Absolute 0.3 0.1 - 1.0 K/uL  Eosinophils Absolute 0.0 0.0 - 0.7 K/uL   Basophils Absolute 0.0 0.0 - 0.1 K/uL   WBC Morphology WHITE COUNT CONFIRMED ON SMEAR    Smear Review LARGE PLATELETS PRESENT   Comprehensive metabolic panel     Status: Abnormal   Collection Time: 01/21/15  4:13 AM  Result Value Ref Range   Sodium 133 (L) 135 - 145 mmol/L   Potassium 2.8 (L) 3.5 - 5.1 mmol/L   Chloride 101 101 - 111 mmol/L   CO2 22 22 - 32 mmol/L   Glucose, Bld 107 (H) 65 - 99 mg/dL   BUN 30 (H) 6 - 20 mg/dL   Creatinine, Ser 1.24 0.61 - 1.24 mg/dL   Calcium 7.4 (L) 8.9 - 10.3 mg/dL   Total Protein 6.6 6.5 - 8.1 g/dL   Albumin 2.8 (L) 3.5 - 5.0 g/dL   AST 49 (H) 15 - 41 U/L   ALT 73 (H) 17 - 63 U/L   Alkaline Phosphatase 44 38 - 126 U/L   Total Bilirubin 1.1 0.3 - 1.2 mg/dL   GFR calc non Af Amer >60 >60 mL/min   GFR calc Af Amer >60 >60 mL/min    Comment: (NOTE) The eGFR has been calculated using the CKD EPI equation. This calculation has not been validated in all clinical situations. eGFR's persistently <60 mL/min signify possible Chronic Kidney Disease.    Anion gap 10 5 - 15  Lipase, blood     Status: None   Collection Time: 01/21/15  4:13 AM  Result Value Ref Range   Lipase 23 22 - 51 U/L  I-Stat CG4 Lactic Acid, ED     Status: Abnormal   Collection Time: 01/21/15  4:52 AM  Result Value Ref Range   Lactic Acid, Venous 2.76 (HH) 0.5 - 2.0 mmol/L   Comment NOTIFIED PHYSICIAN   Urinalysis, Routine w reflex microscopic (not at [facility])     Status: Abnormal   Collection Time: 01/21/15  4:54 AM  Result Value  Ref Range   Color, Urine AMBER (A) YELLOW    Comment: BIOCHEMICALS MAY BE AFFECTED BY COLOR   APPearance CLOUDY (A) CLEAR   Specific Gravity, Urine 1.019 1.005 - 1.030   pH 6.0 5.0 - 8.0   Glucose, UA NEGATIVE NEGATIVE mg/dL   Hgb urine dipstick TRACE (A) NEGATIVE   Bilirubin Urine NEGATIVE NEGATIVE   Ketones, ur NEGATIVE NEGATIVE mg/dL   Protein, ur 100 (A) NEGATIVE mg/dL   Urobilinogen, UA 1.0 0.0 - 1.0 mg/dL   Nitrite NEGATIVE NEGATIVE   Leukocytes, UA NEGATIVE NEGATIVE  Urine microscopic-add on     Status: Abnormal   Collection Time: 01/21/15  4:54 AM  Result Value Ref Range   Squamous Epithelial / LPF RARE RARE   WBC, UA 0-2 <3 WBC/hpf   RBC / HPF 0-2 <3 RBC/hpf   Bacteria, UA MANY (A) RARE   Casts GRANULAR CAST (A) NEGATIVE    Comment: HYALINE CASTS  Rapid HIV screen (HIV 1/2 Ab+Ag)     Status: None   Collection Time: 01/21/15  6:27 AM  Result Value Ref Range   HIV-1 P24 Antigen - HIV24 NON REACTIVE NON REACTIVE   HIV 1/2 Antibodies NON REACTIVE NON REACTIVE   Interpretation (HIV Ag Ab)      A non reactive test result means that HIV 1 or HIV 2 antibodies and HIV 1 p24 antigen were not detected in the specimen.  CBC with Differential     Status: Abnormal   Collection  Time: 01/21/15  6:27 AM  Result Value Ref Range   WBC 0.9 (LL) 4.0 - 10.5 K/uL    Comment: REPEATED TO VERIFY CRITICAL VALUE NOTED.  VALUE IS CONSISTENT WITH PREVIOUSLY REPORTED AND CALLED VALUE.    RBC 3.83 (L) 4.22 - 5.81 MIL/uL   Hemoglobin 10.0 (L) 13.0 - 17.0 g/dL   HCT 31.0 (L) 39.0 - 52.0 %   MCV 80.9 78.0 - 100.0 fL   MCH 26.1 26.0 - 34.0 pg   MCHC 32.3 30.0 - 36.0 g/dL   RDW 13.8 11.5 - 15.5 %   Platelets  150 - 400 K/uL    PLATELET CLUMPS NOTED ON SMEAR, COUNT APPEARS ADEQUATE   Neutrophils Relative % 13 (L) 43 - 77 %   Lymphocytes Relative 54 (H) 12 - 46 %   Monocytes Relative 33 (H) 3 - 12 %   Eosinophils Relative 0 0 - 5 %   Basophils Relative 0 0 - 1 %   Neutro Abs 0.1 (L) 1.7 - 7.7  K/uL   Lymphs Abs 0.5 (L) 0.7 - 4.0 K/uL   Monocytes Absolute 0.3 0.1 - 1.0 K/uL   Eosinophils Absolute 0.0 0.0 - 0.7 K/uL   Basophils Absolute 0.0 0.0 - 0.1 K/uL   WBC Morphology WHITE COUNT CONFIRMED ON SMEAR   Save smear     Status: None   Collection Time: 01/21/15  6:27 AM  Result Value Ref Range   Smear Review SMEAR STAINED AND AVAILABLE FOR REVIEW   Troponin I     Status: None   Collection Time: 01/21/15  7:30 AM  Result Value Ref Range   Troponin I <0.03 <0.031 ng/mL    Comment:        NO INDICATION OF MYOCARDIAL INJURY.   Basic metabolic panel     Status: Abnormal   Collection Time: 01/21/15  7:30 AM  Result Value Ref Range   Sodium 133 (L) 135 - 145 mmol/L   Potassium 3.0 (L) 3.5 - 5.1 mmol/L   Chloride 103 101 - 111 mmol/L   CO2 25 22 - 32 mmol/L   Glucose, Bld 112 (H) 65 - 99 mg/dL   BUN 26 (H) 6 - 20 mg/dL   Creatinine, Ser 1.17 0.61 - 1.24 mg/dL   Calcium 7.1 (L) 8.9 - 10.3 mg/dL   GFR calc non Af Amer >60 >60 mL/min   GFR calc Af Amer >60 >60 mL/min    Comment: (NOTE) The eGFR has been calculated using the CKD EPI equation. This calculation has not been validated in all clinical situations. eGFR's persistently <60 mL/min signify possible Chronic Kidney Disease.    Anion gap 5 5 - 15  Magnesium     Status: None   Collection Time: 01/21/15  7:30 AM  Result Value Ref Range   Magnesium 1.8 1.7 - 2.4 mg/dL  Pathologist smear review     Status: None   Collection Time: 01/21/15  7:30 AM  Result Value Ref Range   Path Review Reviewed by Kalman Drape, M.D.     Comment: 6.3.16 LEUKOPENIA. NORMOCYTIC ANEMIA  WITH SCATTERED HYPOCHROMIC CELLS.   Procalcitonin - Baseline     Status: None   Collection Time: 01/21/15  7:30 AM  Result Value Ref Range   Procalcitonin 4.17 ng/mL    Comment:        Interpretation: PCT > 2 ng/mL: Systemic infection (sepsis) is likely, unless other causes are known. (NOTE)  ICU PCT Algorithm               Non ICU PCT  Algorithm    ----------------------------     ------------------------------         PCT < 0.25 ng/mL                 PCT < 0.1 ng/mL     Stopping of antibiotics            Stopping of antibiotics       strongly encouraged.               strongly encouraged.    ----------------------------     ------------------------------       PCT level decrease by               PCT < 0.25 ng/mL       >= 80% from peak PCT       OR PCT 0.25 - 0.5 ng/mL          Stopping of antibiotics                                             encouraged.     Stopping of antibiotics           encouraged.    ----------------------------     ------------------------------       PCT level decrease by              PCT >= 0.25 ng/mL       < 80% from peak PCT        AND PCT >= 0.5 ng/mL            Continuing antibiotics                                               encouraged.       Continuing antibiotics            encouraged.    ----------------------------     ------------------------------     PCT level increase compared          PCT > 0.5 ng/mL         with peak PCT AND          PCT >= 0.5 ng/mL             Escalation of antibiotics                                          strongly encouraged.      Escalation of antibiotics        strongly encouraged.   Sedimentation rate     Status: Abnormal   Collection Time: 01/21/15  7:30 AM  Result Value Ref Range   Sed Rate 80 (H) 0 - 16 mm/hr  HIV antibody     Status: None   Collection Time: 01/21/15  7:30 AM  Result Value Ref Range   HIV Screen 4th Generation wRfx Non Reactive Non Reactive    Comment: (NOTE) Performed At: [facility] [place], [place] [id number] Lindon Romp MD QG:[id number]  Lipase, blood     Status: Abnormal   Collection Time: 01/21/15  7:30 AM  Result Value Ref Range   Lipase 115 (H) 22 - 51 U/L  ABO/Rh     Status: None   Collection Time: 01/21/15  7:33 AM  Result Value Ref Range   ABO/RH(D) O POS   Occult  blood card to lab, stool RN will collect     Status: None   Collection Time: 01/21/15  8:00 AM  Result Value Ref Range   Fecal Occult Bld NEGATIVE NEGATIVE  Type and screen     Status: None   Collection Time: 01/21/15  9:45 AM  Result Value Ref Range   ABO/RH(D) O POS    Antibody Screen NEG    Sample Expiration 01/24/2015   Lactate dehydrogenase     Status: None   Collection Time: 01/21/15  9:45 AM  Result Value Ref Range   LDH 178 98 - 192 U/L  Cortisol     Status: None   Collection Time: 01/21/15  9:45 AM  Result Value Ref Range   Cortisol, Plasma 15.4 ug/dL    Comment: (NOTE) AM    6.7 - 22.6 ug/dL PM   <10.0       ug/dL Performed at [place]     Status: Abnormal   Collection Time: 01/21/15  9:45 AM  Result Value Ref Range   Prothrombin Time 19.2 (H) 11.6 - 15.2 seconds   INR 1.61 (H) 0.00 - 1.49  TSH     Status: None   Collection Time: 01/21/15  9:45 AM  Result Value Ref Range   TSH 0.633 0.350 - 4.500 uIU/mL  Iron and TIBC     Status: Abnormal   Collection Time: 01/21/15  9:45 AM  Result Value Ref Range   Iron 13 (L) 45 - 182 ug/dL   TIBC 181 (L) 250 - 450 ug/dL   Saturation Ratios 7 (L) 17.9 - 39.5 %   UIBC 168 ug/dL    Comment: Performed at [facility]  Ferritin     Status: Abnormal   Collection Time: 01/21/15  9:45 AM  Result Value Ref Range   Ferritin 577 (H) 24 - 336 ng/mL    Comment: Performed at [facility]  Vitamin B12     Status: Abnormal   Collection Time: 01/21/15  9:45 AM  Result Value Ref Range   Vitamin B-12 915 (H) 180 - 914 pg/mL    Comment: (NOTE) This assay is not validated for testing neonatal or myeloproliferative syndrome specimens for Vitamin B12 levels. Performed at [facility]   Lactic acid, plasma     Status: None   Collection Time: 01/21/15  5:18 PM  Result Value Ref Range   Lactic Acid, Venous 0.8 0.5 - 2.0 mmol/L  Clostridium Difficile by PCR     Status: None   Collection Time:  01/22/15  2:40 AM  Result Value Ref Range   C difficile by pcr NEGATIVE NEGATIVE  CBC with Differential/Platelet     Status: Abnormal   Collection Time: 01/22/15  5:21 AM  Result Value Ref Range   WBC 1.4 (LL) 4.0 - 10.5 K/uL    Comment: RESULT REPEATED AND VERIFIED CRITICAL VALUE NOTED.  VALUE IS CONSISTENT WITH PREVIOUSLY REPORTED AND CALLED VALUE. WHITE COUNT CONFIRMED ON SMEAR    RBC 3.44 (L) 4.22 - 5.81 MIL/uL   Hemoglobin 8.8 (L) 13.0 - 17.0 g/dL   HCT 27.7 (  L) 39.0 - 52.0 %   MCV 80.5 78.0 - 100.0 fL   MCH 25.6 (L) 26.0 - 34.0 pg   MCHC 31.8 30.0 - 36.0 g/dL   RDW 14.1 11.5 - 15.5 %   Platelets 262 150 - 400 K/uL   Neutrophils Relative % 9 (L) 43 - 77 %   Lymphocytes Relative 54 (H) 12 - 46 %   Monocytes Relative 37 (H) 3 - 12 %   Eosinophils Relative 0 0 - 5 %   Basophils Relative 0 0 - 1 %   Neutro Abs 0.1 (L) 1.7 - 7.7 K/uL   Lymphs Abs 0.8 0.7 - 4.0 K/uL   Monocytes Absolute 0.5 0.1 - 1.0 K/uL   Eosinophils Absolute 0.0 0.0 - 0.7 K/uL   Basophils Absolute 0.0 0.0 - 0.1 K/uL   WBC Morphology WHITE COUNT CONFIRMED ON SMEAR   Comprehensive metabolic panel     Status: Abnormal   Collection Time: 01/22/15  5:21 AM  Result Value Ref Range   Sodium 135 135 - 145 mmol/L   Potassium 3.4 (L) 3.5 - 5.1 mmol/L   Chloride 107 101 - 111 mmol/L   CO2 22 22 - 32 mmol/L   Glucose, Bld 95 65 - 99 mg/dL   BUN 14 6 - 20 mg/dL   Creatinine, Ser 0.92 0.61 - 1.24 mg/dL   Calcium 7.5 (L) 8.9 - 10.3 mg/dL   Total Protein 6.3 (L) 6.5 - 8.1 g/dL   Albumin 2.5 (L) 3.5 - 5.0 g/dL   AST 37 15 - 41 U/L   ALT 58 17 - 63 U/L   Alkaline Phosphatase 42 38 - 126 U/L   Total Bilirubin 1.0 0.3 - 1.2 mg/dL   GFR calc non Af Amer >60 >60 mL/min   GFR calc Af Amer >60 >60 mL/min    Comment: (NOTE) The eGFR has been calculated using the CKD EPI equation. This calculation has not been validated in all clinical situations. eGFR's persistently <60 mL/min signify possible Chronic Kidney Disease.      Anion gap 6 5 - 15    MICRO: Blood cx ngtd IMAGING: Ct Head Wo Contrast  01/21/2015   CLINICAL DATA:  Nausea, vomiting, syncope, history hypertension, pancreatitis, chills, weakness, hypotensive at presentation  EXAM: CT HEAD WITHOUT CONTRAST  TECHNIQUE: Contiguous axial images were obtained from the base of the skull through the vertex without intravenous contrast.  COMPARISON:  None  FINDINGS: Mild atrophy.  Normal ventricular morphology.  No midline shift or mass effect.  Normal appearance of brain parenchyma.  Streak artifacts at skull base, for which repeat imaging was performed.  No intracranial hemorrhage, mass lesion or evidence acute infarction.  No extra-axial fluid collections.  Bones and sinuses unremarkable.  IMPRESSION: No acute intracranial abnormalities.   Electronically Signed   By: Lavonia Dana M.D.   On: 01/21/2015 12:36   Ct Abdomen Pelvis W Contrast  01/21/2015   CLINICAL DATA:  59 year old next the with history of hypertension, pancreatitis, presents with nausea, vomiting, syncope. Epigastric pain, worsening with vomiting. Diarrhea in chills.  EXAM: CT ABDOMEN AND PELVIS WITH CONTRAST  TECHNIQUE: Multidetector CT imaging of the abdomen and pelvis was performed using the standard protocol following bolus administration of intravenous contrast.  CONTRAST:  18m OMNIPAQUE IOHEXOL 300 MG/ML  SOLN  COMPARISON:  09/20/2003  FINDINGS: Lower chest: Dependent opacities in the lung bases, likely atelectasis. No effusions. Heart is normal size.  Hepatobiliary: No biliary ductal dilatation or focal  hepatic lesion. Gallbladder grossly unremarkable.  Pancreas: No focal pancreatic abnormality. Small amount of fluid adjacent to the pancreatic tail in the left anterior para renal space. Small amount of fluid also noted in the right anterior para renal space.  Spleen: No focal abnormality.  Normal size.  Adrenals/Urinary Tract: Small cysts in the upper pole and midpole of the right kidney. No  hydronephrosis. No renal or ureteral stones. Adrenal glands and urinary bladder unremarkable. Foley catheter in place within the bladder.  Stomach/Bowel: Stomach, large and small bowel grossly unremarkable. Appendix is visualized and unremarkable.  Vascular/Lymphatic: No retroperitoneal or mesenteric adenopathy. Aorta normal caliber.  Reproductive: No mass or other significant abnormality.  Other: No free fluid or free air.  Musculoskeletal: No focal bone lesion or acute bony abnormality.  : Small amount of fluid within the anterior para renal spaces bilaterally, including adjacent to the pancreatic tail. While no definite focal pancreatic process or inflammatory change around the pancreas is noted, this could reflect changes of early acute pancreatitis. Recommend clinical correlation.  Small right renal cysts.  Posterior bibasilar opacities, likely atelectasis.   Electronically Signed   By: Rolm Baptise M.D.   On: 01/21/2015 12:33   Mr Foot Left Wo Contrast  01/21/2015   CLINICAL DATA:  Ulceration on the plantar aspect of the great toe. Elevated white blood cell count and fever. Initial encounter  EXAM: MRI OF THE LEFT FOREFOOT WITHOUT CONTRAST  TECHNIQUE: Multiplanar, multisequence MR imaging was performed. No intravenous contrast was administered.  COMPARISON:  Plain films of the left great toe 01/2002/ 2016 and plain films left foot 06/14/2014.  FINDINGS: There appears to be a skin ulceration on the plantar surface of the great toe just distal to the IP joint. There is marrow edema in the distal 1.4 cm of the proximal phalanx of the great toe eccentric on the medial side. The small cortical defect seen on the most recent plain films appears well-marginated without adjacent edema.  There is first MTP joint space narrowing with mild subchondral edema in the central head of the first metatarsal consistent with osteoarthritis. Bone marrow signal is otherwise unremarkable. No fluid collection is identified.  Intrinsic musculature the foot is somewhat atrophic.  IMPRESSION: Skin ulceration appears to be on the plantar surface of the great toe at or just distal to the IP joint. There is no abscess. Edema in the distal 1.4 cm of the proximal phalanx of the great toe could be secondary to osteomyelitis, inflammatory change or stress change.  Mild appearing first MTP osteoarthritis.   Electronically Signed   By: Inge Rise M.D.   On: 01/21/2015 13:21   Dg Chest Portable 1 View  01/21/2015   CLINICAL DATA:  Vomiting for several days, weakness and dehydration.  EXAM: PORTABLE CHEST - 1 VIEW  COMPARISON:  11/13/2014  FINDINGS: Lung volumes are low. Minimal linear atelectasis at the lung bases, left greater than right. The cardiomediastinal contours are normal. Pulmonary vasculature is normal for technique. No consolidation, pleural effusion, or pneumothorax. No acute osseous abnormalities are seen.  IMPRESSION: Hypoventilatory chest with bibasilar atelectasis.   Electronically Signed   By: Jeb Levering M.D.   On: 01/21/2015 02:46   Dg Toe Great Left  01/21/2015   CLINICAL DATA:  Ulceration at the plantar aspect of the great toe. Initial encounter.  EXAM: LEFT GREAT TOE  COMPARISON:  Left foot radiographs performed 06/14/2014  FINDINGS: The known soft tissue ulceration is difficult to fully characterize on radiograph. No  radiopaque foreign bodies are seen. Scattered soft tissue calcifications are suggested at the great toe. There is suggestion of a small erosion at the medial distal aspect of the first proximal phalanx.  The visualized joint spaces are preserved. The patient is status post amputation of the distal second metatarsal. No osseous erosions are  IMPRESSION: 1. Suggestion of a small erosion at the medial distal aspect of the first proximal phalanx; osteomyelitis is a concern. MRI could be considered for further evaluation as deemed clinically appropriate. 2. Soft tissue ulceration not well characterized  on radiograph. No radiopaque foreign bodies seen. Scattered soft calcifications suggested at the great toe.   Electronically Signed   By: Garald Balding M.D.   On: 01/21/2015 02:42    Assessment/Plan:  59yo M with febrile neutropenia and left great toe plantar ulcer   febrile neutropenia = possibly due to having bactrim exposure which may have caused bone marrow suppression. Fever could be due to plantar ulcer but  will check cmv, ebv viral load, and cd 4 count  For possible osteo will continue on dapto, and piptazo. Make sure baseline CK is checked.  Neutropenia = will defer to dr. Marin Olp for further needs of gcsf  Diarrhea = agree with check with c.difficile, but possibly just antibiotic associated c.difficile  Will provide further recs tomorrow

## 2015-01-22 NOTE — Progress Notes (Addendum)
ANTIBIOTIC CONSULT NOTE - FOLLOW UP  Pharmacy Consult for Daptomycin Indication: possible osteomyelitis  Allergies  Allergen Reactions  . Shellfish Allergy Swelling    Patient Measurements: Height: 6' 2.5" (189.2 cm) Weight: 184 lb 6.4 oz (83.643 kg) IBW/kg (Calculated) : 83.35  Vital Signs: Temp: 100.5 F (38.1 C) (06/04 1416) Temp Source: Oral (06/04 1416) BP: 114/60 mmHg (06/04 1416) Pulse Rate: 100 (06/04 1416) Intake/Output from previous day: 06/03 0701 - 06/04 0700 In: 4497.5 [I.V.:3047.5; IV Piggyback:1450] Out: 2326 [Urine:2325; Stool:1] Intake/Output from this shift: Total I/O In: 1241.3 [P.O.:240; I.V.:1001.3] Out: 2 [Stool:2]  Labs:  Recent Labs  01/21/15 0413 01/21/15 0627 01/21/15 0730 01/22/15 0521  WBC 0.8* 0.9*  --  1.4*  HGB 9.3* 10.0*  --  8.8*  PLT 286 PLATELET CLUMPS NOTED ON SMEAR, COUNT APPEARS ADEQUATE  --  262  CREATININE 1.24  --  1.17 0.92   Estimated Creatinine Clearance: 100.7 mL/min (by C-G formula based on Cr of 0.92). No results for input(s): VANCOTROUGH, VANCOPEAK, VANCORANDOM, GENTTROUGH, GENTPEAK, GENTRANDOM, TOBRATROUGH, TOBRAPEAK, TOBRARND, AMIKACINPEAK, AMIKACINTROU, AMIKACIN in the last 72 hours.   Microbiology: Recent Results (from the past 720 hour(s))  Blood culture (routine x 2)     Status: None (Preliminary result)   Collection Time: 01/21/15  2:19 AM  Result Value Ref Range Status   Specimen Description BLOOD RAC  Final   Special Requests BOTTLES DRAWN AEROBIC AND ANAEROBIC 5CC  Final   Culture   Final           BLOOD CULTURE RECEIVED NO GROWTH TO DATE CULTURE WILL BE HELD FOR 5 DAYS BEFORE ISSUING A FINAL NEGATIVE REPORT Performed at [facility]    Report Status PENDING  Incomplete  Blood culture (routine x 2)     Status: None (Preliminary result)   Collection Time: 01/21/15  3:13 AM  Result Value Ref Range Status   Specimen Description BLOOD LEFT HAND  Final   Special Requests BOTTLES DRAWN AEROBIC  AND ANAEROBIC 5ML  Final   Culture   Final           BLOOD CULTURE RECEIVED NO GROWTH TO DATE CULTURE WILL BE HELD FOR 5 DAYS BEFORE ISSUING A FINAL NEGATIVE REPORT Performed at [facility]    Report Status PENDING  Incomplete  Urine culture     Status: None   Collection Time: 01/21/15  4:54 AM  Result Value Ref Range Status   Specimen Description URINE, CATHETERIZED  Final   Special Requests NONE  Final   Colony Count NO GROWTH Performed at [facility]   Final   Culture NO GROWTH Performed at [facility]   Final   Report Status 01/22/2015 FINAL  Final  Clostridium Difficile by PCR     Status: None   Collection Time: 01/22/15  2:40 AM  Result Value Ref Range Status   C difficile by pcr NEGATIVE NEGATIVE Final    Anti-infectives    Start     Dose/Rate Route Frequency Ordered Stop   01/21/15 2000  DAPTOmycin (CUBICIN) 482 mg in sodium chloride 0.9 % IVPB     6 mg/kg  80.3 kg 219.3 mL/hr over 30 Minutes Intravenous  Once 01/21/15 1907 01/21/15 2152   01/21/15 1600  vancomycin (VANCOCIN) IVPB 1000 mg/200 mL premix  Status:  Discontinued     1,000 mg 200 mL/hr over 60 Minutes Intravenous Every 12 hours 01/21/15 0613 01/21/15 1749   01/21/15 1000  piperacillin-tazobactam (ZOSYN) IVPB 3.375  g     3.375 g 12.5 mL/hr over 240 Minutes Intravenous Every 8 hours 01/21/15 0751     01/21/15 0345  piperacillin-tazobactam (ZOSYN) IVPB 3.375 g     3.375 g 12.5 mL/hr over 240 Minutes Intravenous  Once 01/21/15 0337 01/21/15 0500   01/21/15 0345  vancomycin (VANCOCIN) IVPB 1000 mg/200 mL premix     1,000 mg 200 mL/hr over 60 Minutes Intravenous  Once 01/21/15 0344 01/21/15 0530      Assessment: 32 yoF with history of HTN, COPD, chronic pancreatitis, amputation of left second toe, and chronic neutropenia presented to the ED septic with hypotension, febrile and pancytopenic. Patient was started on vancomycin and zosyn for sepsis, great toe ulcer. MRI of foot  noted that the edema of the great toe could be secondary to osteomyelitis, inflammatory change or stress change. Hem/Onc was consulted for leukopenia. Dr. Marin Olp concerned that vancomycin can cause neutropenia and wants to change to daptomycin.  ID seen and cannot rule out osteomyelitis; agrees with continuing daptomycin per pharmacy.  6/3 >> vancomycin >> 6/3 6/3 >> Zosyn >>   6/3 >> daptomycin >>  Tmax: 102.6 yesterday, now 100.5 WBCs: neutropenia, chronic Renal: SCr bumped on admission; now resolved; CrCl > 90 Baseline CK pending  6/3 Blood: NGTD 6/3 urine: NGF 6/3 C diff: neg   Goal of Therapy:  Eradication of infection Appropriate antibiotic dosing for indication and renal function  Plan:  Day 1 antibiotics  Continue daptomycin 6 mg/kg q24 hr  Continue Zosyn 3.375 g IV q8 hr by extended infusion  Follow clinical course, renal function, culture results as available  Follow for de-escalation of antibiotics and LOT   Reuel Boom, PharmD Pager: [phone] 01/22/2015, 6:44 PM

## 2015-01-22 NOTE — Progress Notes (Signed)
[facility] PROGRESS NOTE  Stephen Whitaker TKP:[id number] DOB: Apr 12, 1956 DOA: 01/21/2015 PCP: Angelica Chessman, MD Brief narrative 59 year old male with history of hypertension, COPD, chronic pancreatitis, amputation of left second toe presented to the ED with an episode of loss of consciousness. Patient was recently prescribed a two-week course of Bactrim for ulceration of his left great toe. For the past few days he has had multiple episodes of diarrhea with 2 episodes of vomiting and nausea. Also reported epigastric discomfort. For the past 2 days patient reports poor by mouth intake. On the night prior to admission he went to the kitchen to get some food when he became weak in past out for a few minutes with stool incontinence. On arrival patient was septic with hypotension, febrile and pancytopenic. Also had elevated lactate acid. X-ray of the left foot showed possible osteomyelitis. Patient admitted to telemetry for sepsis.   Assessment/Plan: Sepsis  Possibly related to acute early osteomyelitis  versus colitis seen on abd CT.. Follow blood cultures. On empiric IV Zosyn ( vanco dced by heme given pancytopenia and ordered a dose of Cubicin on 6/3). Blood pressure improved with aggressive IV hydration. MRI of the left foot shows edema in the distal phalanx of the great toe possibly associated with possible osteomyelitis.  consulted orthopedics. (Dr  Lorin Mercy) who suggests this could be early osteomyelitis vs cellulitis. Recommend empiric antibodies and dressing. Appreciate wound care evaluation. -UA unremarkable. stool for C. difficile negative Normal cortisol level. -Patient still febrile and pancytopenic. ID consulted.   Pancytopenia Possibly related to sepsis and recent use of Bactrim. Appreciate hematology consult. Monitor CBC closely. Check LDH, folate,. Stool for occult blood negative. Iron deficiency noted. Ordered IV iron by hematology on 6/3. Vancomycin discontinued by  hematology and ordered a dose of Cubicin on 6/3. Further antibody recommendations per ID.Marland Kitchen  Normal TSH and B12.    Syncope Patient has had similar symptoms recently and had outpatient workup including normal 2-D echo and carotids. CT head unremarkable. EEG negative.  Acute pancreatitis/ possible colitis Monitor with IV hydration.   Lactic acidosis Secondary to sepsis. Resolved with hydration.  Hypokalemia Replenished    Diet: Regular DVT prophylaxis: SCDs  Code Status: Full code Family Communication: None at bedside Disposition Plan: Continue inpatient monitoring   Consultants:  Hematology  Orthopedics  Procedures:  EEG   head CT  MRI left foot  Antibiotics:  IV vancomycin 6/2-6/3  IV  Zosyn.6/3--  IV cubicin x 1 dose on 6/3  HPI/Subjective: Seen and examined. Has some abd discomfort which is better. No diarrhea , N/V. Reports some dysuria.  Objective: Filed Vitals:   01/22/15 0623  BP: 119/68  Pulse: 97  Temp: 101.3 F (38.5 C)  Resp: 16    Intake/Output Summary (Last 24 hours) at 01/22/15 1240 Last data filed at 01/22/15 1207  Gross per 24 hour  Intake 3587.5 ml  Output   2328 ml  Net 1259.5 ml   Filed Weights   01/21/15 0649 01/22/15 0623  Weight: 80.3 kg (177 lb 0.5 oz) 83.643 kg (184 lb 6.4 oz)    Exam:   General:  no acute distress  HEENT: Pallor present, dry oral mucosa, supple neck  Chest: Clear to auscultation bilaterally, no added sounds  CVS: Normal S1 and S2, no murmurs rub or gallop  GI: Soft, nondistended, nontender, bowel sounds present  Musculoskeletal: Warm, no edema, ulcerations over base of left great toe, without discharge but some foul-smelling  CNS: Alert and oriented.  Data Reviewed: Basic Metabolic Panel:  Recent Labs Lab 01/21/15 0413 01/21/15 0730 01/22/15 0521  NA 133* 133* 135  K 2.8* 3.0* 3.4*  CL 101 103 107  CO2 22 25 22   GLUCOSE 107* 112* 95  BUN 30* 26* 14  CREATININE 1.24 1.17  0.92  CALCIUM 7.4* 7.1* 7.5*  MG  --  1.8  --    Liver Function Tests:  Recent Labs Lab 01/21/15 0413 01/22/15 0521  AST 49* 37  ALT 73* 58  ALKPHOS 44 42  BILITOT 1.1 1.0  PROT 6.6 6.3*  ALBUMIN 2.8* 2.5*    Recent Labs Lab 01/21/15 0413 01/21/15 0730  LIPASE 23 115*   No results for input(s): AMMONIA in the last 168 hours. CBC:  Recent Labs Lab 01/21/15 0413 01/21/15 0627 01/22/15 0521  WBC 0.8* 0.9* 1.4*  NEUTROABS 0.0* 0.1* 0.1*  HGB 9.3* 10.0* 8.8*  HCT 28.0* 31.0* 27.7*  MCV 80.2 80.9 80.5  PLT 286 PLATELET CLUMPS NOTED ON SMEAR, COUNT APPEARS ADEQUATE 262   Cardiac Enzymes:  Recent Labs Lab 01/21/15 0730  TROPONINI <0.03   BNP (last 3 results) No results for input(s): BNP in the last 8760 hours.  ProBNP (last 3 results) No results for input(s): PROBNP in the last 8760 hours.  CBG: No results for input(s): GLUCAP in the last 168 hours.  Recent Results (from the past 240 hour(s))  Blood culture (routine x 2)     Status: None (Preliminary result)   Collection Time: 01/21/15  2:19 AM  Result Value Ref Range Status   Specimen Description BLOOD RAC  Final   Special Requests BOTTLES DRAWN AEROBIC AND ANAEROBIC 5CC  Final   Culture   Final           BLOOD CULTURE RECEIVED NO GROWTH TO DATE CULTURE WILL BE HELD FOR 5 DAYS BEFORE ISSUING A FINAL NEGATIVE REPORT Performed at [facility]    Report Status PENDING  Incomplete  Blood culture (routine x 2)     Status: None (Preliminary result)   Collection Time: 01/21/15  3:13 AM  Result Value Ref Range Status   Specimen Description BLOOD LEFT HAND  Final   Special Requests BOTTLES DRAWN AEROBIC AND ANAEROBIC 5ML  Final   Culture   Final           BLOOD CULTURE RECEIVED NO GROWTH TO DATE CULTURE WILL BE HELD FOR 5 DAYS BEFORE ISSUING A FINAL NEGATIVE REPORT Performed at [facility]    Report Status PENDING  Incomplete  Clostridium Difficile by PCR     Status: None   Collection  Time: 01/22/15  2:40 AM  Result Value Ref Range Status   C difficile by pcr NEGATIVE NEGATIVE Final     Studies: Ct Head Wo Contrast  01/21/2015   CLINICAL DATA:  Nausea, vomiting, syncope, history hypertension, pancreatitis, chills, weakness, hypotensive at presentation  EXAM: CT HEAD WITHOUT CONTRAST  TECHNIQUE: Contiguous axial images were obtained from the base of the skull through the vertex without intravenous contrast.  COMPARISON:  None  FINDINGS: Mild atrophy.  Normal ventricular morphology.  No midline shift or mass effect.  Normal appearance of brain parenchyma.  Streak artifacts at skull base, for which repeat imaging was performed.  No intracranial hemorrhage, mass lesion or evidence acute infarction.  No extra-axial fluid collections.  Bones and sinuses unremarkable.  IMPRESSION: No acute intracranial abnormalities.   Electronically Signed   By: Lavonia Dana M.D.   On: 01/21/2015  12:36   Ct Abdomen Pelvis W Contrast  01/21/2015   CLINICAL DATA:  59 year old next the with history of hypertension, pancreatitis, presents with nausea, vomiting, syncope. Epigastric pain, worsening with vomiting. Diarrhea in chills.  EXAM: CT ABDOMEN AND PELVIS WITH CONTRAST  TECHNIQUE: Multidetector CT imaging of the abdomen and pelvis was performed using the standard protocol following bolus administration of intravenous contrast.  CONTRAST:  114mL OMNIPAQUE IOHEXOL 300 MG/ML  SOLN  COMPARISON:  09/20/2003  FINDINGS: Lower chest: Dependent opacities in the lung bases, likely atelectasis. No effusions. Heart is normal size.  Hepatobiliary: No biliary ductal dilatation or focal hepatic lesion. Gallbladder grossly unremarkable.  Pancreas: No focal pancreatic abnormality. Small amount of fluid adjacent to the pancreatic tail in the left anterior para renal space. Small amount of fluid also noted in the right anterior para renal space.  Spleen: No focal abnormality.  Normal size.  Adrenals/Urinary Tract: Small cysts  in the upper pole and midpole of the right kidney. No hydronephrosis. No renal or ureteral stones. Adrenal glands and urinary bladder unremarkable. Foley catheter in place within the bladder.  Stomach/Bowel: Stomach, large and small bowel grossly unremarkable. Appendix is visualized and unremarkable.  Vascular/Lymphatic: No retroperitoneal or mesenteric adenopathy. Aorta normal caliber.  Reproductive: No mass or other significant abnormality.  Other: No free fluid or free air.  Musculoskeletal: No focal bone lesion or acute bony abnormality.  : Small amount of fluid within the anterior para renal spaces bilaterally, including adjacent to the pancreatic tail. While no definite focal pancreatic process or inflammatory change around the pancreas is noted, this could reflect changes of early acute pancreatitis. Recommend clinical correlation.  Small right renal cysts.  Posterior bibasilar opacities, likely atelectasis.   Electronically Signed   By: Rolm Baptise M.D.   On: 01/21/2015 12:33   Mr Foot Left Wo Contrast  01/21/2015   CLINICAL DATA:  Ulceration on the plantar aspect of the great toe. Elevated white blood cell count and fever. Initial encounter  EXAM: MRI OF THE LEFT FOREFOOT WITHOUT CONTRAST  TECHNIQUE: Multiplanar, multisequence MR imaging was performed. No intravenous contrast was administered.  COMPARISON:  Plain films of the left great toe 01/2002/ 2016 and plain films left foot 06/14/2014.  FINDINGS: There appears to be a skin ulceration on the plantar surface of the great toe just distal to the IP joint. There is marrow edema in the distal 1.4 cm of the proximal phalanx of the great toe eccentric on the medial side. The small cortical defect seen on the most recent plain films appears well-marginated without adjacent edema.  There is first MTP joint space narrowing with mild subchondral edema in the central head of the first metatarsal consistent with osteoarthritis. Bone marrow signal is otherwise  unremarkable. No fluid collection is identified. Intrinsic musculature the foot is somewhat atrophic.  IMPRESSION: Skin ulceration appears to be on the plantar surface of the great toe at or just distal to the IP joint. There is no abscess. Edema in the distal 1.4 cm of the proximal phalanx of the great toe could be secondary to osteomyelitis, inflammatory change or stress change.  Mild appearing first MTP osteoarthritis.   Electronically Signed   By: Inge Rise M.D.   On: 01/21/2015 13:21   Dg Chest Portable 1 View  01/21/2015   CLINICAL DATA:  Vomiting for several days, weakness and dehydration.  EXAM: PORTABLE CHEST - 1 VIEW  COMPARISON:  11/13/2014  FINDINGS: Lung volumes are low. Minimal  linear atelectasis at the lung bases, left greater than right. The cardiomediastinal contours are normal. Pulmonary vasculature is normal for technique. No consolidation, pleural effusion, or pneumothorax. No acute osseous abnormalities are seen.  IMPRESSION: Hypoventilatory chest with bibasilar atelectasis.   Electronically Signed   By: Jeb Levering M.D.   On: 01/21/2015 02:46   Dg Toe Great Left  01/21/2015   CLINICAL DATA:  Ulceration at the plantar aspect of the great toe. Initial encounter.  EXAM: LEFT GREAT TOE  COMPARISON:  Left foot radiographs performed 06/14/2014  FINDINGS: The known soft tissue ulceration is difficult to fully characterize on radiograph. No radiopaque foreign bodies are seen. Scattered soft tissue calcifications are suggested at the great toe. There is suggestion of a small erosion at the medial distal aspect of the first proximal phalanx.  The visualized joint spaces are preserved. The patient is status post amputation of the distal second metatarsal. No osseous erosions are  IMPRESSION: 1. Suggestion of a small erosion at the medial distal aspect of the first proximal phalanx; osteomyelitis is a concern. MRI could be considered for further evaluation as deemed clinically appropriate.  2. Soft tissue ulceration not well characterized on radiograph. No radiopaque foreign bodies seen. Scattered soft calcifications suggested at the great toe.   Electronically Signed   By: Garald Balding M.D.   On: 01/21/2015 02:42    Scheduled Meds: . allopurinol  100 mg Oral Daily  . bacitracin   Topical Daily  . fluticasone  1 spray Each Nare Daily  . lipase/protease/amylase  24,000 Units Oral Daily  . loratadine  10 mg Oral Daily  . mometasone-formoterol  2 puff Inhalation BID  . pantoprazole  40 mg Oral Daily  . piperacillin-tazobactam (ZOSYN)  IV  3.375 g Intravenous Q8H  . sodium chloride  1,000 mL Intravenous Once   Continuous Infusions: . 0.9 % NaCl with KCl 20 mEq / L 75 mL/hr at 01/22/15 1021     Time spent: 35 minutes    Dajae Kizer, [place]  [facility] Pager [phone]. If 7PM-7AM, please contact night-coverage at [email or web address], password [facility] 01/22/2015, 12:40 PM  LOS: 1 day

## 2015-01-23 LAB — BASIC METABOLIC PANEL
ANION GAP: 8 (ref 5–15)
BUN: 13 mg/dL (ref 6–20)
CO2: 23 mmol/L (ref 22–32)
CREATININE: 0.79 mg/dL (ref 0.61–1.24)
Calcium: 8 mg/dL — ABNORMAL LOW (ref 8.9–10.3)
Chloride: 107 mmol/L (ref 101–111)
GFR calc non Af Amer: >60 mL/min (ref >60)
Glucose, Bld: 91 mg/dL (ref 65–99)
Potassium: 2.9 mmol/L — ABNORMAL LOW (ref 3.5–5.1)
Sodium: 138 mmol/L (ref 135–145)

## 2015-01-23 LAB — CBC WITH DIFFERENTIAL/PLATELET
BASOS PCT: 2 % — AB (ref 0–1)
Basophils Absolute: 0.1 10*3/uL (ref 0.0–0.1)
EOS ABS: 0 10*3/uL (ref 0.0–0.7)
EOS PCT: 0 % (ref 0–5)
HCT: 28.5 % — ABNORMAL LOW (ref 39.0–52.0)
Hemoglobin: 9.2 g/dL — ABNORMAL LOW (ref 13.0–17.0)
Lymphocytes Relative: 27 % (ref 12–46)
Lymphs Abs: 1 10*3/uL (ref 0.7–4.0)
MCH: 25.9 pg — AB (ref 26.0–34.0)
MCHC: 32.3 g/dL (ref 30.0–36.0)
MCV: 80.3 fL (ref 78.0–100.0)
Monocytes Absolute: 1.4 10*3/uL — ABNORMAL HIGH (ref 0.1–1.0)
Monocytes Relative: 37 % — ABNORMAL HIGH (ref 3–12)
NEUTROS ABS: 1.3 10*3/uL — AB (ref 1.7–7.7)
NEUTROS PCT: 34 % — AB (ref 43–77)
Platelets: 244 10*3/uL (ref 150–400)
RBC: 3.55 MIL/uL — ABNORMAL LOW (ref 4.22–5.81)
RDW: 14.1 % (ref 11.5–15.5)
WBC: 3.8 10*3/uL — ABNORMAL LOW (ref 4.0–10.5)

## 2015-01-23 LAB — LIPASE, BLOOD: Lipase: 73 U/L — ABNORMAL HIGH (ref 22–51)

## 2015-01-23 LAB — PROCALCITONIN: Procalcitonin: 1.69 ng/mL

## 2015-01-23 MED ORDER — SODIUM CHLORIDE 0.9 % IV SOLN
500.0000 mg | INTRAVENOUS | Status: DC
  Administered 2015-01-23: 500 mg via INTRAVENOUS
  Filled 2015-01-23 (×2): qty 10

## 2015-01-23 MED ORDER — IBUPROFEN 200 MG PO TABS
400.0000 mg | ORAL_TABLET | Freq: Once | ORAL | Status: AC
  Administered 2015-01-23: 400 mg via ORAL
  Filled 2015-01-23: qty 2

## 2015-01-23 NOTE — Progress Notes (Signed)
[facility] PROGRESS NOTE  Stephen Whitaker FXT:[id number] DOB: 10/17/1955 DOA: 01/21/2015 PCP: Angelica Chessman, MD Brief narrative 59 year old male with history of hypertension, COPD, chronic pancreatitis, amputation of left second toe presented to the ED with an episode of loss of consciousness. Patient was recently prescribed a two-week course of Bactrim for ulceration of his left great toe. For the past few days he has had multiple episodes of diarrhea with 2 episodes of vomiting and nausea. Also reported epigastric discomfort. For the past 2 days patient reports poor by mouth intake. On the night prior to admission he went to the kitchen to get some food when he became weak in past out for a few minutes with stool incontinence. On arrival patient was septic with hypotension, febrile and pancytopenic. Also had elevated lactate acid. X-ray of the left foot showed possible osteomyelitis. Patient admitted to telemetry for sepsis.   Assessment/Plan: Sepsis  Possibly related to acute early osteomyelitis  versus colitis seen on abd CT.. Follow blood cultures. On empiric IV Zosyn. Vancomycin switched to Cubicin. MRI of the left foot shows edema in the distal phalanx of the great toe possibly associated with possible osteomyelitis.  consulted orthopedics. (Dr  Lorin Mercy) who suggests this could be early osteomyelitis vs cellulitis. Recommend empiric antibodies and dressing. Appreciate wound care evaluation. -UA unremarkable. stool for C. difficile negative . Normal cortisol level. -Afebrile last 24 hours and neutropenia improving. -Follow T helper cells, EBV and CMV PCR.  Pancytopenia Possibly related to sepsis and recent use of Bactrim. Appreciate hematology consult. Wbc improving after Neupogen given on 6/3. LDH normal.. Stool for occult blood negative. Iron deficiency noted. Given  IV iron by hematology on 6/4. Vancomycin switched to Cubicin. Normal TSH and B12.    Syncope Patient has  had similar symptoms recently and had outpatient workup including normal 2-D echo and carotids. CT head unremarkable. EEG negative.  Acute pancreatitis/ possible colitis Monitor with IV hydration.   Lactic acidosis Secondary to sepsis. Resolved with hydration.  Hypokalemia Persistent. Replenished. Check magnesium level.   Diet: Regular DVT prophylaxis: SCDs  Code Status: Full code Family Communication: None at bedside Disposition Plan: Home once pancytopenia improving and further plan per ortho   Consultants:  Hematology  Orthopedics  ID  Procedures:  EEG   head CT  MRI left foot  Antibiotics:  IV vancomycin 6/2-6/3  IV  Zosyn.6/3--  IV cubicin x 1 dose on 6/3  HPI/Subjective: Seen and examined. Still has some loose stool but no nausea, vomiting or abdominal pain.  Objective: Filed Vitals:   01/23/15 0635  BP: 117/66  Pulse: 75  Temp: 97.4 F (36.3 C)  Resp: 20    Intake/Output Summary (Last 24 hours) at 01/23/15 1414 Last data filed at 01/23/15 1200  Gross per 24 hour  Intake 1776.25 ml  Output      2 ml  Net 1774.25 ml   Filed Weights   01/21/15 0649 01/22/15 0623 01/23/15 0635  Weight: 80.3 kg (177 lb 0.5 oz) 83.643 kg (184 lb 6.4 oz) 84.868 kg (187 lb 1.6 oz)    Exam:   General:  no acute distress  HEENT: Pallor present, moist oral mucosa, supple neck  Chest: Clear to auscultation bilaterally, no added sounds  CVS: Normal S1 and S2, no murmurs rub or gallop  GI: Soft, nondistended, nontender, bowel sounds present  Musculoskeletal: Warm, no edema, ulcerations over base of left great toe, without discharge but some foul-smell  CNS: Alert and oriented.  Data Reviewed: Basic Metabolic Panel:  Recent Labs Lab 01/21/15 0413 01/21/15 0730 01/22/15 0521 01/23/15 0514  NA 133* 133* 135 138  K 2.8* 3.0* 3.4* 2.9*  CL 101 103 107 107  CO2 22 25 22 23   GLUCOSE 107* 112* 95 91  BUN 30* 26* 14 13  CREATININE 1.24 1.17  0.92 0.79  CALCIUM 7.4* 7.1* 7.5* 8.0*  MG  --  1.8  --   --    Liver Function Tests:  Recent Labs Lab 01/21/15 0413 01/22/15 0521  AST 49* 37  ALT 73* 58  ALKPHOS 44 42  BILITOT 1.1 1.0  PROT 6.6 6.3*  ALBUMIN 2.8* 2.5*    Recent Labs Lab 01/21/15 0413 01/21/15 0730 01/23/15 0514  LIPASE 23 115* 73*   No results for input(s): AMMONIA in the last 168 hours. CBC:  Recent Labs Lab 01/21/15 0413 01/21/15 0627 01/22/15 0521 01/23/15 0514  WBC 0.8* 0.9* 1.4* 3.8*  NEUTROABS 0.0* 0.1* 0.1* 1.3*  HGB 9.3* 10.0* 8.8* 9.2*  HCT 28.0* 31.0* 27.7* 28.5*  MCV 80.2 80.9 80.5 80.3  PLT 286 PLATELET CLUMPS NOTED ON SMEAR, COUNT APPEARS ADEQUATE 262 244   Cardiac Enzymes:  Recent Labs Lab 01/21/15 0730 01/22/15 1800  CKTOTAL  --  359  TROPONINI <0.03  --    BNP (last 3 results) No results for input(s): BNP in the last 8760 hours.  ProBNP (last 3 results) No results for input(s): PROBNP in the last 8760 hours.  CBG: No results for input(s): GLUCAP in the last 168 hours.  Recent Results (from the past 240 hour(s))  Blood culture (routine x 2)     Status: None (Preliminary result)   Collection Time: 01/21/15  2:19 AM  Result Value Ref Range Status   Specimen Description BLOOD RAC  Final   Special Requests BOTTLES DRAWN AEROBIC AND ANAEROBIC 5CC  Final   Culture   Final           BLOOD CULTURE RECEIVED NO GROWTH TO DATE CULTURE WILL BE HELD FOR 5 DAYS BEFORE ISSUING A FINAL NEGATIVE REPORT Performed at [facility]    Report Status PENDING  Incomplete  Blood culture (routine x 2)     Status: None (Preliminary result)   Collection Time: 01/21/15  3:13 AM  Result Value Ref Range Status   Specimen Description BLOOD LEFT HAND  Final   Special Requests BOTTLES DRAWN AEROBIC AND ANAEROBIC 5ML  Final   Culture   Final           BLOOD CULTURE RECEIVED NO GROWTH TO DATE CULTURE WILL BE HELD FOR 5 DAYS BEFORE ISSUING A FINAL NEGATIVE REPORT Performed at [facility]    Report Status PENDING  Incomplete  Urine culture     Status: None   Collection Time: 01/21/15  4:54 AM  Result Value Ref Range Status   Specimen Description URINE, CATHETERIZED  Final   Special Requests NONE  Final   Colony Count NO GROWTH Performed at [facility]   Final   Culture NO GROWTH Performed at [facility]   Final   Report Status 01/22/2015 FINAL  Final  Clostridium Difficile by PCR     Status: None   Collection Time: 01/22/15  2:40 AM  Result Value Ref Range Status   C difficile by pcr NEGATIVE NEGATIVE Final     Studies: No results found.  Scheduled Meds: . allopurinol  100 mg Oral Daily  . bacitracin  Topical Daily  . DAPTOmycin (CUBICIN)  IV  500 mg Intravenous Q24H  . fluticasone  1 spray Each Nare Daily  . lipase/protease/amylase  24,000 Units Oral Daily  . loratadine  10 mg Oral Daily  . mometasone-formoterol  2 puff Inhalation BID  . pantoprazole  40 mg Oral Daily  . piperacillin-tazobactam (ZOSYN)  IV  3.375 g Intravenous Q8H  . sodium chloride  1,000 mL Intravenous Once   Continuous Infusions:     Time spent: 35 minutes    Madelina Sanda, [place]  [facility] Pager [phone]. If 7PM-7AM, please contact night-coverage at [email or web address], password [facility] 01/23/2015, 2:14 PM  LOS: 2 days

## 2015-01-24 DIAGNOSIS — D61818 Other pancytopenia: Secondary | ICD-10-CM | POA: Diagnosis present

## 2015-01-24 DIAGNOSIS — Z8619 Personal history of other infectious and parasitic diseases: Secondary | ICD-10-CM

## 2015-01-24 DIAGNOSIS — IMO0001 Reserved for inherently not codable concepts without codable children: Secondary | ICD-10-CM | POA: Insufficient documentation

## 2015-01-24 DIAGNOSIS — D500 Iron deficiency anemia secondary to blood loss (chronic): Secondary | ICD-10-CM | POA: Insufficient documentation

## 2015-01-24 DIAGNOSIS — K861 Other chronic pancreatitis: Secondary | ICD-10-CM | POA: Diagnosis present

## 2015-01-24 DIAGNOSIS — M869 Osteomyelitis, unspecified: Secondary | ICD-10-CM | POA: Insufficient documentation

## 2015-01-24 DIAGNOSIS — R5081 Fever presenting with conditions classified elsewhere: Secondary | ICD-10-CM

## 2015-01-24 DIAGNOSIS — D709 Neutropenia, unspecified: Secondary | ICD-10-CM | POA: Insufficient documentation

## 2015-01-24 DIAGNOSIS — D72819 Decreased white blood cell count, unspecified: Secondary | ICD-10-CM | POA: Insufficient documentation

## 2015-01-24 DIAGNOSIS — Z89422 Acquired absence of other left toe(s): Secondary | ICD-10-CM

## 2015-01-24 LAB — CBC WITH DIFFERENTIAL/PLATELET
Band Neutrophils: 10 % (ref 0–10)
Basophils Absolute: 0 10*3/uL (ref 0.0–0.1)
Basophils Relative: 0 % (ref 0–1)
Blasts: 0 %
EOS ABS: 0 10*3/uL (ref 0.0–0.7)
Eosinophils Relative: 0 % (ref 0–5)
HCT: 27 % — ABNORMAL LOW (ref 39.0–52.0)
Hemoglobin: 8.7 g/dL — ABNORMAL LOW (ref 13.0–17.0)
LYMPHS ABS: 1.9 10*3/uL (ref 0.7–4.0)
Lymphocytes Relative: 22 % (ref 12–46)
MCH: 25.7 pg — AB (ref 26.0–34.0)
MCHC: 32.2 g/dL (ref 30.0–36.0)
MCV: 79.6 fL (ref 78.0–100.0)
MYELOCYTES: 10 %
Metamyelocytes Relative: 4 %
Monocytes Absolute: 1.4 10*3/uL — ABNORMAL HIGH (ref 0.1–1.0)
Monocytes Relative: 16 % — ABNORMAL HIGH (ref 3–12)
NEUTROS ABS: 5.4 10*3/uL (ref 1.7–7.7)
NEUTROS PCT: 38 % — AB (ref 43–77)
NRBC: 1 /100{WBCs} — AB
Other: 0 %
Platelets: 276 10*3/uL (ref 150–400)
Promyelocytes Absolute: 0 %
RBC: 3.39 MIL/uL — AB (ref 4.22–5.81)
RDW: 14.3 % (ref 11.5–15.5)
WBC: 8.7 10*3/uL (ref 4.0–10.5)

## 2015-01-24 LAB — T-HELPER CELLS (CD4) COUNT (NOT AT ARMC)
CD4 T CELL ABS: 200 /uL — AB (ref 400–2700)
CD4 T CELL HELPER: 30 % — AB (ref 33–55)

## 2015-01-24 LAB — FOLATE RBC
Folate, Hemolysate: 566.1 ng/mL
Folate, RBC: 2044 ng/mL (ref >498)
Hematocrit: 27.7 % — ABNORMAL LOW (ref 37.5–51.0)

## 2015-01-24 MED ORDER — HYDROCODONE-ACETAMINOPHEN 5-325 MG PO TABS: 1.0000 | ORAL_TABLET | Freq: Four times a day (QID) | ORAL | Status: DC | PRN

## 2015-01-24 MED ORDER — AMOXICILLIN-POT CLAVULANATE 875-125 MG PO TABS
1.0000 | ORAL_TABLET | Freq: Two times a day (BID) | ORAL | Status: DC
  Administered 2015-01-24: 1 via ORAL
  Filled 2015-01-24: qty 1

## 2015-01-24 MED ORDER — LOPERAMIDE HCL 2 MG PO CAPS
2.0000 mg | ORAL_CAPSULE | ORAL | Status: DC | PRN
  Administered 2015-01-24: 2 mg via ORAL
  Filled 2015-01-24: qty 1

## 2015-01-24 MED ORDER — BACITRACIN ZINC 500 UNIT/GM EX OINT: TOPICAL_OINTMENT | Freq: Every day | CUTANEOUS | Status: DC

## 2015-01-24 MED ORDER — LOPERAMIDE HCL 2 MG PO CAPS: 2.0000 mg | ORAL_CAPSULE | ORAL | Status: DC | PRN

## 2015-01-24 MED ORDER — AMOXICILLIN-POT CLAVULANATE 875-125 MG PO TABS: 1.0000 | ORAL_TABLET | Freq: Two times a day (BID) | ORAL | Status: DC

## 2015-01-24 NOTE — Progress Notes (Signed)
[facility]    Subjective: Patient reports a few loose stools this morning, C diff screen is pending, he is relieved that he was previously constipated.   He reports he has had neuropathy of unknown underlying cause of his bilateral extremities for a few years.  His left 2nd toe was amputated in November of 2015 due to osteomyelitis. He otherwise feels well and has no acute complaints.  His Tmax overnight was 99.7.   Antibiotics:  Anti-infectives    Start     Dose/Rate Route Frequency Ordered Stop   01/24/15 1500  amoxicillin-clavulanate (AUGMENTIN) 875-125 MG per tablet 1 tablet     1 tablet Oral Every 12 hours 01/24/15 1422     01/23/15 2000  DAPTOmycin (CUBICIN) 500 mg in sodium chloride 0.9 % IVPB  Status:  Discontinued     500 mg 220 mL/hr over 30 Minutes Intravenous Every 24 hours 01/23/15 0724 01/24/15 1422   01/22/15 2000  DAPTOmycin (CUBICIN) 500 mg in sodium chloride 0.9 % IVPB     500 mg 220 mL/hr over 30 Minutes Intravenous  Once 01/22/15 1858 01/22/15 2047   01/21/15 2000  DAPTOmycin (CUBICIN) 482 mg in sodium chloride 0.9 % IVPB     6 mg/kg  80.3 kg 219.3 mL/hr over 30 Minutes Intravenous  Once 01/21/15 1907 01/21/15 2152   01/21/15 1600  vancomycin (VANCOCIN) IVPB 1000 mg/200 mL premix  Status:  Discontinued     1,000 mg 200 mL/hr over 60 Minutes Intravenous Every 12 hours 01/21/15 0613 01/21/15 1749   01/21/15 1000  piperacillin-tazobactam (ZOSYN) IVPB 3.375 g  Status:  Discontinued     3.375 g 12.5 mL/hr over 240 Minutes Intravenous Every 8 hours 01/21/15 0751 01/24/15 1422   01/21/15 0345  piperacillin-tazobactam (ZOSYN) IVPB 3.375 g     3.375 g 12.5 mL/hr over 240 Minutes Intravenous  Once 01/21/15 0337 01/21/15 0500   01/21/15 0345  vancomycin (VANCOCIN) IVPB 1000 mg/200 mL premix     1,000 mg 200 mL/hr over 60 Minutes Intravenous  Once 01/21/15 0344 01/21/15 0530      Medications: Scheduled Meds: . allopurinol  100 mg  Oral Daily  . amoxicillin-clavulanate  1 tablet Oral Q12H  . bacitracin   Topical Daily  . fluticasone  1 spray Each Nare Daily  . lipase/protease/amylase  24,000 Units Oral Daily  . loratadine  10 mg Oral Daily  . mometasone-formoterol  2 puff Inhalation BID  . pantoprazole  40 mg Oral Daily  . sodium chloride  1,000 mL Intravenous Once   Continuous Infusions:  PRN Meds:.acetaminophen **OR** acetaminophen, albuterol, loperamide, ondansetron **OR** ondansetron (ZOFRAN) IV    Objective: Weight change: -6.4 oz (-0.181 kg)  Intake/Output Summary (Last 24 hours) at 01/24/15 1538 Last data filed at 01/24/15 1346  Gross per 24 hour  Intake    690 ml  Output      0 ml  Net    690 ml   Blood pressure 122/69, pulse 108, temperature 98.7 F (37.1 C), temperature source Oral, resp. rate 20, height 6' 2.5" (1.892 m), weight 186 lb 11.2 oz (84.687 kg), SpO2 98 %. Temp:  [98.7 F (37.1 C)-99.7 F (37.6 C)] 98.7 F (37.1 C) (06/06 1321) Pulse Rate:  [105-110] 108 (06/06 1321) Resp:  [18-20] 20 (06/06 1321) BP: (122-137)/(69-81) 122/69 mmHg (06/06 1321) SpO2:  [96 %-98 %] 98 % (06/06 1321) Weight:  [186 lb 11.2 oz (84.687 kg)] 186 lb 11.2 oz (84.687 kg) (06/06  0509)  Physical Exam: General: resting in bed in NAD HEENT:no scleral icterus Cardiac: RRR, no rubs, murmurs or gallops Pulm: clear to auscultation bilaterally, moving normal volumes of air Abd: soft, nontender, nondistended, BS present Ext: left 1st toe has ~1cm plantar ulcer without active drainage., 2nd toe is amputated Neuro: alert and oriented X3  CBC: Lab Results  Component Value Date   WBC 8.7 01/24/2015   HGB 8.7* 01/24/2015   HCT 27.0* 01/24/2015   MCV 79.6 01/24/2015   PLT 276 01/24/2015      BMET  Recent Labs  01/22/15 0521 01/23/15 0514  NA 135 138  K 3.4* 2.9*  CL 107 107  CO2 22 23  GLUCOSE 95 91  BUN 14 13  CREATININE 0.92 0.79  CALCIUM 7.5* 8.0*     Liver Panel   Recent Labs   01/22/15 0521  PROT 6.3*  ALBUMIN 2.5*  AST 37  ALT 58  ALKPHOS 42  BILITOT 1.0       Sedimentation Rate No results for input(s): ESRSEDRATE in the last 72 hours. C-Reactive Protein No results for input(s): CRP in the last 72 hours.  Micro Results: Recent Results (from the past 720 hour(s))  Blood culture (routine x 2)     Status: None (Preliminary result)   Collection Time: 01/21/15  2:19 AM  Result Value Ref Range Status   Specimen Description BLOOD RAC  Final   Special Requests BOTTLES DRAWN AEROBIC AND ANAEROBIC 5CC  Final   Culture   Final           BLOOD CULTURE RECEIVED NO GROWTH TO DATE CULTURE WILL BE HELD FOR 5 DAYS BEFORE ISSUING A FINAL NEGATIVE REPORT Performed at [facility]    Report Status PENDING  Incomplete  Blood culture (routine x 2)     Status: None (Preliminary result)   Collection Time: 01/21/15  3:13 AM  Result Value Ref Range Status   Specimen Description BLOOD LEFT HAND  Final   Special Requests BOTTLES DRAWN AEROBIC AND ANAEROBIC 5ML  Final   Culture   Final           BLOOD CULTURE RECEIVED NO GROWTH TO DATE CULTURE WILL BE HELD FOR 5 DAYS BEFORE ISSUING A FINAL NEGATIVE REPORT Performed at [facility]    Report Status PENDING  Incomplete  Urine culture     Status: None   Collection Time: 01/21/15  4:54 AM  Result Value Ref Range Status   Specimen Description URINE, CATHETERIZED  Final   Special Requests NONE  Final   Colony Count NO GROWTH Performed at [facility]   Final   Culture NO GROWTH Performed at [facility]   Final   Report Status 01/22/2015 FINAL  Final  Clostridium Difficile by PCR     Status: None   Collection Time: 01/22/15  2:40 AM  Result Value Ref Range Status   C difficile by pcr NEGATIVE NEGATIVE Final    Studies/Results: No results found.    Assessment/Plan:  Principal Problem:   Sepsis Active Problems:   Leucopenia   Anemia   Syncope   Diarrhea   Hypokalemia    Osteomyelitis   Pressure ulcer   Blood poisoning   Ulcer of great toe    Stephen Whitaker is a 59 y.o. male with hx of chronic leukopenia presented septic with febrile neutropenia due to possible osteomyelitis of his left 1st toe.  Neutropenia: seen inpatient by Dr. Marin Olp, has previously  seen Dr Alen Blew in the past (no BM biopsy), felt likely to be due to ethnic associated leukopenia that may have been exacerbated in the setting of sepsis/ vancomycin use.  He has received neupogen and his WBC now is 8.7  Possible Osteomyelitis: Follow outpatient by Dr Sharol Given, seen by Dr Lorin Mercy inpatient.  Has been treated inpatient with IV Daptomycin and Pip/tazo.  Blood cultures are no growth to date. - Can change to oral Augmentin, and follow up as outpatient.  Diarrhea - C diff negative, likely antibiotic associated diarrhea.   LOS: 3 days   Stephen Whitaker 01/24/2015, 3:38 PM

## 2015-01-24 NOTE — Discharge Summary (Signed)
Physician Discharge Summary  ESAIAH Whitaker MCN:[id number] DOB: 11/01/1955 DOA: 01/21/2015  PCP: Angelica Chessman, MD  Admit date: 01/21/2015 Discharge date: 01/24/2015  Time spent: 35 minutes  Recommendations for Outpatient Follow-up:  1. Discharge home with outpatient follow-up with PCP on 6/9 at 3 PM 2. Follow-up with Dr. Sharol Given in 2 weeks 3. Patient will complete a total 10 day course of antibiotic (Augmentin) for his left toe cellulitis and possible colitis on 6/12. Please follow-up CBC as outpatient. These also start patient on iron supplement as outpatient once current diarrhea resolved. 4. Please follow T helper cells, EBV and CMV sent during hospitalization. 5. Patient sees hematologist Dr. Alen Blew for his neutropenia and can follow-up as needed.  Discharge Diagnoses:  Principal Problem:   Sepsis   Active Problems:   Ulcer of great toe/ early osteomyelitis   Other pancytopenia    acute versus acute on Chronic pancreatitis   Essential hypertension   Leucopenia   Anemia   Syncope   Diarrhea   Hypokalemia   Pressure ulcer      Discharge Condition: Fair  Diet recommendation: Regular    Filed Weights   01/22/15 0623 01/23/15 0635 01/24/15 0509  Weight: 83.643 kg (184 lb 6.4 oz) 84.868 kg (187 lb 1.6 oz) 84.687 kg (186 lb 11.2 oz)    History of present illness:  Please refer to admission H&P for details, in brief, 59 year old male with history of hypertension, COPD, chronic pancreatitis, amputation of left second toe presented to the ED with an episode of loss of consciousness. Patient was recently prescribed a two-week course of Bactrim for ulceration of his left great toe. For the past few days he has had multiple episodes of diarrhea with 2 episodes of vomiting and nausea. Also reported epigastric discomfort. For the past 2 days patient reports poor by mouth intake. On the night prior to admission he went to the kitchen to get some food when he became weak in past  out for a few minutes with stool incontinence. On arrival patient was septic with hypotension, febrile and pancytopenic. Also had elevated lactate acid. X-ray of the left foot showed possible osteomyelitis. Patient admitted to telemetry for sepsis.   Assessment/Plan: Sepsis  Possibly related to acute early osteomyelitis/ cellulitis of right toe versus colitis seen on abd CT.Marland Kitchen Blood cultures negative. On empiric IV Zosyn. Vancomycin switched to Cubicin given pancytopenia. MRI of the left foot showed edema in the distal phalanx of the great toe possibly associated with possible osteomyelitis. consulted orthopedics. (Dr Lorin Mercy) who suggests this could be early osteomyelitis vs cellulitis. Recommend empiric antibodies and dressing. Appreciate wound care evaluation. -UA unremarkable. stool for C. difficile negative . Normal cortisol level. -Patient remains afebrile past 48 hours and neutropenia has resolved. Given clinical improvement would doubt osteomyelitis and patient will be discharged home on antibiotics and should follow-up with Dr. Sharol Given in 2 weeks. -Follow T helper cells, EBV and CMV PCR As outpatient.. -Appreciate ID evaluation. Patient will be discharged on oral Augmentin 875 mg twice daily to complete a total 10 day course of antibiotics.   Pancytopenia Possibly related to sepsis and recent use of Bactrim. Appreciate hematology consult. Wbc improving after Neupogen given on 6/3. LDH normal.. Stool for occult blood negative. Iron deficiency noted. Given IV iron by hematology on 6/4. Vancomycin switched to Cubicin. Normal TSH and B12.    Syncope Patient has had similar symptoms recently and had outpatient workup including normal 2-D echo and carotids. CT head unremarkable. EEG negative.No  further episode.  Acute pancreatitis/ possible colitis Symptoms improved with IV hydration.Patient has loose diarrhea which could be related to chronic pancreatitis. Added Imodium and encouraged to  continue pancrelipase.   patient denies smoking but reports occasional drinking of beer. Counseled strongly on avoiding alcohol.  Cellulitis / ?early  osteomyelitis Symptoms have improved on antibiotics. Will discharge and oral Augmentin as per ID recommendation. He should follow-up with Dr. Sharol Given in 2 weeks evaluate the ulcer. Will prescribe bacitracin for dressing.   Lactic acidosis Secondary to sepsis. Resolved with hydration.  Hypokalemia Replenished.   diarrhea Likely associated with antibiotics/chronic pancreatitis. C. difficile negative. Prescribed Imodium.     Diet: Regular   Code Status: Full code Family Communication: None at bedside Disposition Plan: Home outpatient follow-up    Consultants  hematology  orthopedics  ID  Procedures:  EEG  head CT  MRI left foot  Antibiotics:  IV vancomycin 6/2-6/3  IV Zosyn.6/3--  IV cubicin x 1 dose on 6/3  Discharge Exam: Filed Vitals:   01/24/15 1321  BP: 122/69  Pulse: 108  Temp: 98.7 F (37.1 C)  Resp: 20     Gen. elderly male in no acute distress   HEENT: Pallor present, moist oral mucosa, supple neck Chest: Clear to auscultation bilaterally, no added sounds CVS: Normal S1 and S2, no murmurs or gallop GI: Soft, nondistended, nontender, bowel sounds present  musculoskeletal: Warm, no edema, ulceration or base of the left great toe, no discharge, no foul smell CNS: Alert and oriented   Discharge Instructions    Current Discharge Medication List    START taking these medications   Details  amoxicillin-clavulanate (AUGMENTIN) 875-125 MG per tablet Take 1 tablet by mouth every 12 (twelve) hours. Qty: 14 tablet, Refills: 0    bacitracin ointment Apply topically daily. Qty: 120 g, Refills: 0    HYDROcodone-acetaminophen (NORCO/VICODIN) 5-325 MG per tablet Take 1 tablet by mouth every 6 (six) hours as needed for moderate pain. Qty: 30 tablet, Refills: 0    loperamide (IMODIUM) 2 MG capsule  Take 1 capsule (2 mg total) by mouth as needed for diarrhea or loose stools. Qty: 30 capsule, Refills: 0      CONTINUE these medications which have NOT CHANGED   Details  albuterol (PROVENTIL HFA;VENTOLIN HFA) 108 (90 BASE) MCG/ACT inhaler Inhale 2 puffs into the lungs every 6 (six) hours as needed for wheezing or shortness of breath.    allopurinol (ZYLOPRIM) 100 MG tablet Take 100 mg by mouth daily.     Bioflavonoid Products (ESTER C PO) Take 1 tablet by mouth daily.    clobetasol ointment (TEMOVATE) 7.65 % Apply 1 application topically 2 (two) times daily. Qty: 30 g, Refills: 0    Digestive Enzymes (DIGESTIVE ENZYME PO) Take 1 capsule by mouth 3 (three) times daily with meals. [employer] brand    fluticasone (FLONASE) 50 MCG/ACT nasal spray Place 1 spray into both nostrils daily.    Fluticasone-Salmeterol (ADVAIR) 500-50 MCG/DOSE AEPB Inhale 1 puff into the lungs 2 (two) times daily. Qty: 60 each, Refills: 3    hydrochlorothiazide (HYDRODIURIL) 25 MG tablet Take 1 tablet (25 mg total) by mouth daily. Qty: 30 tablet, Refills: 3   Associated Diagnoses: Essential hypertension    loratadine (CLARITIN) 10 MG tablet Take 10 mg by mouth daily.    Multiple Vitamin (MULTIVITAMIN WITH MINERALS) TABS tablet Take 1 tablet by mouth daily.    OVER THE COUNTER MEDICATION Take 1 capsule by mouth 3 (three) times daily with  meals. Blue/green algae    Pancrelipase, Lip-Prot-Amyl, (CREON) 24000 UNITS CPEP Take 1 capsule (24,000 Units total) by mouth daily. Qty: 180 capsule, Refills: 3   Associated Diagnoses: Other chronic pancreatitis    pantoprazole (PROTONIX) 40 MG tablet Take 1 tablet (40 mg total) by mouth daily. Qty: 30 tablet, Refills: 3   Associated Diagnoses: Other chronic pancreatitis      STOP taking these medications     ibuprofen (ADVIL,MOTRIN) 200 MG tablet      ibuprofen (ADVIL,MOTRIN) 800 MG tablet      levofloxacin (LEVAQUIN) 500 MG tablet      predniSONE (DELTASONE) 50 MG  tablet      sulfamethoxazole-trimethoprim (BACTRIM DS,SEPTRA DS) 800-160 MG per tablet        Allergies  Allergen Reactions  . Shellfish Allergy Swelling   Follow-up Information    Follow up with Angelica Chessman, MD On 02/03/2015.   Specialty:  Internal Medicine   Why:  Appointment time:  Thursday, February 03, 2015 at 3:00 pm   Contact information:   [place] [id number] [phone]       Follow up with DUDA,MARCUS V, MD. Schedule an appointment as soon as possible for a visit in 2 weeks.   Specialty:  Orthopedic Surgery   Contact information:   [place] [address] [phone]        The results of significant diagnostics from this hospitalization (including imaging, microbiology, ancillary and laboratory) are listed below for reference.    Significant Diagnostic Studies: Ct Head Wo Contrast  01/21/2015   CLINICAL DATA:  Nausea, vomiting, syncope, history hypertension, pancreatitis, chills, weakness, hypotensive at presentation  EXAM: CT HEAD WITHOUT CONTRAST  TECHNIQUE: Contiguous axial images were obtained from the base of the skull through the vertex without intravenous contrast.  COMPARISON:  None  FINDINGS: Mild atrophy.  Normal ventricular morphology.  No midline shift or mass effect.  Normal appearance of brain parenchyma.  Streak artifacts at skull base, for which repeat imaging was performed.  No intracranial hemorrhage, mass lesion or evidence acute infarction.  No extra-axial fluid collections.  Bones and sinuses unremarkable.  IMPRESSION: No acute intracranial abnormalities.   Electronically Signed   By: Lavonia Dana M.D.   On: 01/21/2015 12:36   Ct Abdomen Pelvis W Contrast  01/21/2015   CLINICAL DATA:  59 year old next the with history of hypertension, pancreatitis, presents with nausea, vomiting, syncope. Epigastric pain, worsening with vomiting. Diarrhea in chills.  EXAM: CT ABDOMEN AND PELVIS WITH CONTRAST  TECHNIQUE:  Multidetector CT imaging of the abdomen and pelvis was performed using the standard protocol following bolus administration of intravenous contrast.  CONTRAST:  146mL OMNIPAQUE IOHEXOL 300 MG/ML  SOLN  COMPARISON:  09/20/2003  FINDINGS: Lower chest: Dependent opacities in the lung bases, likely atelectasis. No effusions. Heart is normal size.  Hepatobiliary: No biliary ductal dilatation or focal hepatic lesion. Gallbladder grossly unremarkable.  Pancreas: No focal pancreatic abnormality. Small amount of fluid adjacent to the pancreatic tail in the left anterior para renal space. Small amount of fluid also noted in the right anterior para renal space.  Spleen: No focal abnormality.  Normal size.  Adrenals/Urinary Tract: Small cysts in the upper pole and midpole of the right kidney. No hydronephrosis. No renal or ureteral stones. Adrenal glands and urinary bladder unremarkable. Foley catheter in place within the bladder.  Stomach/Bowel: Stomach, large and small bowel grossly unremarkable. Appendix is visualized and unremarkable.  Vascular/Lymphatic: No  retroperitoneal or mesenteric adenopathy. Aorta normal caliber.  Reproductive: No mass or other significant abnormality.  Other: No free fluid or free air.  Musculoskeletal: No focal bone lesion or acute bony abnormality.  : Small amount of fluid within the anterior para renal spaces bilaterally, including adjacent to the pancreatic tail. While no definite focal pancreatic process or inflammatory change around the pancreas is noted, this could reflect changes of early acute pancreatitis. Recommend clinical correlation.  Small right renal cysts.  Posterior bibasilar opacities, likely atelectasis.   Electronically Signed   By: Rolm Baptise M.D.   On: 01/21/2015 12:33   Mr Foot Left Wo Contrast  01/21/2015   CLINICAL DATA:  Ulceration on the plantar aspect of the great toe. Elevated white blood cell count and fever. Initial encounter  EXAM: MRI OF THE LEFT FOREFOOT  WITHOUT CONTRAST  TECHNIQUE: Multiplanar, multisequence MR imaging was performed. No intravenous contrast was administered.  COMPARISON:  Plain films of the left great toe 01/2002/ 2016 and plain films left foot 06/14/2014.  FINDINGS: There appears to be a skin ulceration on the plantar surface of the great toe just distal to the IP joint. There is marrow edema in the distal 1.4 cm of the proximal phalanx of the great toe eccentric on the medial side. The small cortical defect seen on the most recent plain films appears well-marginated without adjacent edema.  There is first MTP joint space narrowing with mild subchondral edema in the central head of the first metatarsal consistent with osteoarthritis. Bone marrow signal is otherwise unremarkable. No fluid collection is identified. Intrinsic musculature the foot is somewhat atrophic.  IMPRESSION: Skin ulceration appears to be on the plantar surface of the great toe at or just distal to the IP joint. There is no abscess. Edema in the distal 1.4 cm of the proximal phalanx of the great toe could be secondary to osteomyelitis, inflammatory change or stress change.  Mild appearing first MTP osteoarthritis.   Electronically Signed   By: Inge Rise M.D.   On: 01/21/2015 13:21   Dg Chest Portable 1 View  01/21/2015   CLINICAL DATA:  Vomiting for several days, weakness and dehydration.  EXAM: PORTABLE CHEST - 1 VIEW  COMPARISON:  11/13/2014  FINDINGS: Lung volumes are low. Minimal linear atelectasis at the lung bases, left greater than right. The cardiomediastinal contours are normal. Pulmonary vasculature is normal for technique. No consolidation, pleural effusion, or pneumothorax. No acute osseous abnormalities are seen.  IMPRESSION: Hypoventilatory chest with bibasilar atelectasis.   Electronically Signed   By: Jeb Levering M.D.   On: 01/21/2015 02:46   Dg Toe Great Left  01/21/2015   CLINICAL DATA:  Ulceration at the plantar aspect of the great toe. Initial  encounter.  EXAM: LEFT GREAT TOE  COMPARISON:  Left foot radiographs performed 06/14/2014  FINDINGS: The known soft tissue ulceration is difficult to fully characterize on radiograph. No radiopaque foreign bodies are seen. Scattered soft tissue calcifications are suggested at the great toe. There is suggestion of a small erosion at the medial distal aspect of the first proximal phalanx.  The visualized joint spaces are preserved. The patient is status post amputation of the distal second metatarsal. No osseous erosions are  IMPRESSION: 1. Suggestion of a small erosion at the medial distal aspect of the first proximal phalanx; osteomyelitis is a concern. MRI could be considered for further evaluation as deemed clinically appropriate. 2. Soft tissue ulceration not well characterized on radiograph. No radiopaque foreign bodies  seen. Scattered soft calcifications suggested at the great toe.   Electronically Signed   By: Garald Balding M.D.   On: 01/21/2015 02:42    Microbiology: Recent Results (from the past 240 hour(s))  Blood culture (routine x 2)     Status: None (Preliminary result)   Collection Time: 01/21/15  2:19 AM  Result Value Ref Range Status   Specimen Description BLOOD RAC  Final   Special Requests BOTTLES DRAWN AEROBIC AND ANAEROBIC 5CC  Final   Culture   Final           BLOOD CULTURE RECEIVED NO GROWTH TO DATE CULTURE WILL BE HELD FOR 5 DAYS BEFORE ISSUING A FINAL NEGATIVE REPORT Performed at [facility]    Report Status PENDING  Incomplete  Blood culture (routine x 2)     Status: None (Preliminary result)   Collection Time: 01/21/15  3:13 AM  Result Value Ref Range Status   Specimen Description BLOOD LEFT HAND  Final   Special Requests BOTTLES DRAWN AEROBIC AND ANAEROBIC 5ML  Final   Culture   Final           BLOOD CULTURE RECEIVED NO GROWTH TO DATE CULTURE WILL BE HELD FOR 5 DAYS BEFORE ISSUING A FINAL NEGATIVE REPORT Performed at [facility]    Report Status  PENDING  Incomplete  Urine culture     Status: None   Collection Time: 01/21/15  4:54 AM  Result Value Ref Range Status   Specimen Description URINE, CATHETERIZED  Final   Special Requests NONE  Final   Colony Count NO GROWTH Performed at [facility]   Final   Culture NO GROWTH Performed at [facility]   Final   Report Status 01/22/2015 FINAL  Final  Clostridium Difficile by PCR     Status: None   Collection Time: 01/22/15  2:40 AM  Result Value Ref Range Status   C difficile by pcr NEGATIVE NEGATIVE Final     Labs: Basic Metabolic Panel:  Recent Labs Lab 01/21/15 0413 01/21/15 0730 01/22/15 0521 01/23/15 0514  NA 133* 133* 135 138  K 2.8* 3.0* 3.4* 2.9*  CL 101 103 107 107  CO2 22 25 22 23   GLUCOSE 107* 112* 95 91  BUN 30* 26* 14 13  CREATININE 1.24 1.17 0.92 0.79  CALCIUM 7.4* 7.1* 7.5* 8.0*  MG  --  1.8  --   --    Liver Function Tests:  Recent Labs Lab 01/21/15 0413 01/22/15 0521  AST 49* 37  ALT 73* 58  ALKPHOS 44 42  BILITOT 1.1 1.0  PROT 6.6 6.3*  ALBUMIN 2.8* 2.5*    Recent Labs Lab 01/21/15 0413 01/21/15 0730 01/23/15 0514  LIPASE 23 115* 73*   No results for input(s): AMMONIA in the last 168 hours. CBC:  Recent Labs Lab 01/21/15 0413 01/21/15 0932 01/21/15 0945 01/22/15 0521 01/23/15 0514 01/24/15 0425  WBC 0.8* 0.9*  --  1.4* 3.8* 8.7  NEUTROABS 0.0* 0.1*  --  0.1* 1.3* 5.4  HGB 9.3* 10.0*  --  8.8* 9.2* 8.7*  HCT 28.0* 31.0* 27.7* 27.7* 28.5* 27.0*  MCV 80.2 80.9  --  80.5 80.3 79.6  PLT 286 PLATELET CLUMPS NOTED ON SMEAR, COUNT APPEARS ADEQUATE  --  262 244 276   Cardiac Enzymes:  Recent Labs Lab 01/21/15 0730 01/22/15 1800  CKTOTAL  --  359  TROPONINI <0.03  --    BNP: BNP (last 3 results) No results  for input(s): BNP in the last 8760 hours.  ProBNP (last 3 results) No results for input(s): PROBNP in the last 8760 hours.  CBG: No results for input(s): GLUCAP in the last 168  hours.     SignedLouellen Molder  [facility] 01/24/2015, 5:45 PM

## 2015-01-25 ENCOUNTER — Ambulatory Visit (HOSPITAL_COMMUNITY): Payer: BLUE CROSS/BLUE SHIELD

## 2015-01-25 LAB — GI PATHOGEN PANEL BY PCR, STOOL
C difficile toxin A/B: NOT DETECTED
Campylobacter by PCR: NOT DETECTED
Cryptosporidium by PCR: NOT DETECTED
E COLI (STEC): NOT DETECTED
E coli (ETEC) LT/ST: NOT DETECTED
E coli 0157 by PCR: NOT DETECTED
G lamblia by PCR: NOT DETECTED
NOROVIRUS G1/G2: NOT DETECTED
Rotavirus A by PCR: NOT DETECTED
SALMONELLA BY PCR: NOT DETECTED
Shigella by PCR: NOT DETECTED

## 2015-01-25 LAB — EPSTEIN BARR VRS(EBV DNA BY PCR)
EBV DNA QN by PCR: NEGATIVE copies/mL
log10 EBV DNA Qn PCR: UNDETERMINED log10copy/mL

## 2015-01-26 ENCOUNTER — Telehealth: Payer: Self-pay | Admitting: Internal Medicine

## 2015-01-26 ENCOUNTER — Encounter (HOSPITAL_COMMUNITY): Payer: BLUE CROSS/BLUE SHIELD

## 2015-01-26 NOTE — Telephone Encounter (Signed)
Pt says that one of prescribed antibiotics is causing diarrhea (which is one of the reasons he was in the hospital).  Pt reports body temp is fluctuating between  97.6-97.9 and he is having swelling in feet, ankles and legs. Pt is concerned and would like instructions on what to do, pt was advised that tomorrow there will be walkin times available.

## 2015-01-27 ENCOUNTER — Ambulatory Visit: Payer: BLUE CROSS/BLUE SHIELD | Attending: Internal Medicine | Admitting: Physician Assistant

## 2015-01-27 VITALS — BP 132/73 | HR 93 | Temp 98.2°F | Resp 18 | Ht 74.0 in | Wt 190.2 lb

## 2015-01-27 DIAGNOSIS — R600 Localized edema: Secondary | ICD-10-CM

## 2015-01-27 LAB — CULTURE, BLOOD (ROUTINE X 2)
CULTURE: NO GROWTH
Culture: NO GROWTH

## 2015-01-27 MED ORDER — POTASSIUM CHLORIDE CRYS ER 20 MEQ PO TBCR: 20.0000 meq | EXTENDED_RELEASE_TABLET | Freq: Every day | ORAL | Status: DC

## 2015-01-27 MED ORDER — CIPROFLOXACIN HCL 500 MG PO TABS: 500.0000 mg | ORAL_TABLET | Freq: Two times a day (BID) | ORAL | Status: DC

## 2015-01-27 MED ORDER — FUROSEMIDE 40 MG PO TABS: 40.0000 mg | ORAL_TABLET | Freq: Every day | ORAL | Status: DC

## 2015-01-27 NOTE — Progress Notes (Signed)
Patient here from hospitalization with dehydration, N/V, and fever.  Patient was discharged yesterday and reports swelling in feet/legs.  Patient states he's been checking his temperature at home.  Patient has question about antibiotics causing diarrhea and Bactrim causing itching/rash/constipation.  Patient reports never smoking.

## 2015-01-27 NOTE — Progress Notes (Signed)
Stephen Whitaker  NKN:[id number]  FXT:[id number]  DOB - [birth year]  Chief Complaint  Patient presents with  . Leg Swelling  . Blood Infection       Subjective:   Stephen Whitaker is a 59 y.o. male here today for an acute visit.  He was hospitalized 01/20/2005 2016. He presented to the emergency department initially after loss of consciousness for unclear duration. He also been suffering from diarrhea, nausea, vomiting,  And decreased oral intake. He was hypotensive upon presentation and pancytopenic. He was diagnosed with sepsis and admitted for IV hydration in antibiotics. He also received wound care and was seen by hematology for pancytopenia, orthopedics, an infectious disease.  His CD4 count is low but no evidence of EBV or HIV. He was hypokalemic during his hospitalization which was replaced. He was sent home on Augmentin.  Since discharge  He feels he has been having a reaction from the Augmentin. He states that he's been having a lot of loose stools. He has already had 3 loose stools this morning. His stomach is a little sore. His bottom is a little sore.  Prior to being hospitalized urgent care and placed him on Bactrim which she was intolerant to as well.He also has been having bilateral lower extremity edema almost up to the knees. No chest pain. No shortness of breath. No dyspnea or orthopnea. His weight has not been calculated at home.  An echocardiogram in March showed normal LV function. He has no history of congestive heart failure.   ROS: GEN: denies fever or chills, denies change in weight Skin:  Positive lesion to great toe on the left, no drainage ; performs self dressing changes daily HEENT: denies headache, earache, epistaxis, sore throat, or neck pain LUNGS: denies SHOB, dyspnea, PND, orthopnea CV: denies CP or palpitations ABD: denies abd pain, N or V EXT: denies muscle spasms or swelling; positive swelling in bilateral lower extremities without  pain   ALLERGIES: Allergies  Allergen Reactions  . Shellfish Allergy Swelling    PAST MEDICAL HISTORY: Past Medical History  Diagnosis Date  . Hypertension   . Pancreatitis, acute     PAST SURGICAL HISTORY: Past Surgical History  Procedure Laterality Date  . Tonsillectomy    . Amputation Left 07/14/2014    Procedure: 2nd Toe Amputation;  Surgeon: [name], MD;  Location: [place];  Service: Orthopedics;  Laterality: Left;    MEDICATIONS AT HOME: Prior to Admission medications   Medication Sig Start Date End Date Taking? Authorizing Provider  albuterol (PROVENTIL HFA;VENTOLIN HFA) 108 (90 BASE) MCG/ACT inhaler Inhale 2 puffs into the lungs every 6 (six) hours as needed for wheezing or shortness of breath.    Historical Provider, MD  allopurinol (ZYLOPRIM) 100 MG tablet Take 100 mg by mouth daily.  07/19/14   Historical Provider, MD  bacitracin ointment Apply topically daily. 01/24/15   Nishant Dhungel, MD  Bioflavonoid Products (ESTER C PO) Take 1 tablet by mouth daily.    Historical Provider, MD  ciprofloxacin (CIPRO) 500 MG tablet Take 1 tablet (500 mg total) by mouth 2 (two) times daily. 01/27/15   Brayton Caves, PA-C  clobetasol ointment (TEMOVATE) 2.99 % Apply 1 application topically 2 (two) times daily. 11/05/14   Tresa Garter, MD  Digestive Enzymes (DIGESTIVE ENZYME PO) Take 1 capsule by mouth 3 (three) times daily with meals. [place] brand    Historical Provider, MD  fluticasone (FLONASE) 50 MCG/ACT nasal spray Place 1 spray into both nostrils daily.  12/29/14   Historical Provider, MD  Fluticasone-Salmeterol (ADVAIR) 500-50 MCG/DOSE AEPB Inhale 1 puff into the lungs 2 (two) times daily. 10/21/14   Tresa Garter, MD  furosemide (LASIX) 40 MG tablet Take 1 tablet (40 mg total) by mouth daily. 01/27/15   Lanisa Ishler Daneil Dan, PA-C  hydrochlorothiazide (HYDRODIURIL) 25 MG tablet Take 1 tablet (25 mg total) by mouth daily. 08/10/14   Tresa Garter, MD   HYDROcodone-acetaminophen (NORCO/VICODIN) 5-325 MG per tablet Take 1 tablet by mouth every 6 (six) hours as needed for moderate pain. 01/24/15   Nishant Dhungel, MD  loperamide (IMODIUM) 2 MG capsule Take 1 capsule (2 mg total) by mouth as needed for diarrhea or loose stools. 01/24/15   Nishant Dhungel, MD  loratadine (CLARITIN) 10 MG tablet Take 10 mg by mouth daily.    Historical Provider, MD  Multiple Vitamin (MULTIVITAMIN WITH MINERALS) TABS tablet Take 1 tablet by mouth daily.    Historical Provider, MD  OVER THE COUNTER MEDICATION Take 1 capsule by mouth 3 (three) times daily with meals. Blue/green algae    Historical Provider, MD  Pancrelipase, Lip-Prot-Amyl, (CREON) 24000 UNITS CPEP Take 1 capsule (24,000 Units total) by mouth daily. 08/10/14   Tresa Garter, MD  pantoprazole (PROTONIX) 40 MG tablet Take 1 tablet (40 mg total) by mouth daily. 08/10/14   Tresa Garter, MD  potassium chloride SA (K-DUR,KLOR-CON) 20 MEQ tablet Take 1 tablet (20 mEq total) by mouth daily. 01/27/15   Brayton Caves, PA-C     Objective:   Filed Vitals:   01/27/15 1219  BP: 132/73  Pulse: 93  Temp: 98.2 F (36.8 C)  TempSrc: Oral  Resp: 18  Height: 6\' 2"  (1.88 m)  Weight: 190 lb 3.2 oz (86.274 kg)  SpO2: 99%    Exam General appearance : Awake, alert, not in any distress. Speech Clear. Not toxic looking Neck: supple, no JVD. No cervical lymphadenopathy.  Chest:Good air entry bilaterally, no added sounds  CVS: S1 S2 regular, no murmurs.  Abdomen: Bowel sounds present, Non tender and not distended with no gaurding, rigidity or rebound. Extremities: B/L Lower Ext shows 2+ edema, both legs are warm to touch Wounds: I did not have him undress his wound  Data Review Lab Results  Component Value Date   HGBA1C 5.6 08/10/2014     Assessment & Plan  1. Bilateral Lower Extremity Edema  -Lasix 40 mg daily * 5 days  -Kdur 20 meq daily * 5 days  -BMP, Mag on 02/01/15  -nl echo March 2016 2.  Diarrhea, likely antibiotic induced  -change antibiotic  -immodium as needed 3. Early Osteomyelitis  -change Augmentin to Cipro  -check temperature daily  -Keep appt with Dr. Sharol Given in 2 weeks   Keep appt on 02/01/15 with Dr. Jarold Song with bloodwork The patient was given clear instructions to go to ER or return to medical center if symptoms don't improve, worsen or new problems develop. The patient verbalized understanding. The patient was told to call to get lab results if they haven't heard anything in the next week.   This note has been created with Surveyor, quantity. Any transcriptional errors are unintentional.    Zettie Pho, PA-C [facility] [place], [place]   01/27/2015, 12:50 PM

## 2015-02-01 ENCOUNTER — Encounter: Payer: Self-pay | Admitting: Family Medicine

## 2015-02-01 ENCOUNTER — Ambulatory Visit: Payer: BLUE CROSS/BLUE SHIELD | Attending: Internal Medicine | Admitting: Family Medicine

## 2015-02-01 DIAGNOSIS — D61818 Other pancytopenia: Secondary | ICD-10-CM | POA: Diagnosis not present

## 2015-02-01 DIAGNOSIS — M869 Osteomyelitis, unspecified: Secondary | ICD-10-CM

## 2015-02-01 DIAGNOSIS — L97522 Non-pressure chronic ulcer of other part of left foot with fat layer exposed: Secondary | ICD-10-CM

## 2015-02-01 LAB — CBC WITH DIFFERENTIAL/PLATELET
Basophils Absolute: 0 10*3/uL (ref 0.0–0.1)
Basophils Relative: 1 % (ref 0–1)
EOS ABS: 0 10*3/uL (ref 0.0–0.7)
EOS PCT: 0 % (ref 0–5)
HEMATOCRIT: 31.1 % — AB (ref 39.0–52.0)
Hemoglobin: 9.6 g/dL — ABNORMAL LOW (ref 13.0–17.0)
Lymphocytes Relative: 35 % (ref 12–46)
Lymphs Abs: 1.2 10*3/uL (ref 0.7–4.0)
MCH: 25.5 pg — AB (ref 26.0–34.0)
MCHC: 30.9 g/dL (ref 30.0–36.0)
MCV: 82.7 fL (ref 78.0–100.0)
MONOS PCT: 15 % — AB (ref 3–12)
MPV: 10.2 fL (ref 8.6–12.4)
Monocytes Absolute: 0.5 10*3/uL (ref 0.1–1.0)
NEUTROS PCT: 49 % (ref 43–77)
Neutro Abs: 1.7 10*3/uL (ref 1.7–7.7)
Platelets: 431 10*3/uL — ABNORMAL HIGH (ref 150–400)
RBC: 3.76 MIL/uL — ABNORMAL LOW (ref 4.22–5.81)
RDW: 15.2 % (ref 11.5–15.5)
WBC: 3.5 10*3/uL — ABNORMAL LOW (ref 4.0–10.5)

## 2015-02-01 LAB — COMPREHENSIVE METABOLIC PANEL
ALBUMIN: 3.4 g/dL — AB (ref 3.5–5.2)
ALT: 59 U/L — AB (ref 0–53)
AST: 41 U/L — ABNORMAL HIGH (ref 0–37)
Alkaline Phosphatase: 77 U/L (ref 39–117)
BUN: 12 mg/dL (ref 6–23)
CALCIUM: 9.1 mg/dL (ref 8.4–10.5)
CHLORIDE: 102 meq/L (ref 96–112)
CO2: 30 mEq/L (ref 19–32)
Creat: 0.97 mg/dL (ref 0.50–1.35)
Glucose, Bld: 79 mg/dL (ref 70–99)
POTASSIUM: 3.9 meq/L (ref 3.5–5.3)
Sodium: 139 mEq/L (ref 135–145)
Total Bilirubin: 0.3 mg/dL (ref 0.2–1.2)
Total Protein: 7 g/dL (ref 6.0–8.3)

## 2015-02-01 MED ORDER — ONE-A-DAY MENS PO TABS: 1.0000 | ORAL_TABLET | Freq: Every day | ORAL | Status: DC

## 2015-02-01 NOTE — Progress Notes (Signed)
Patient is here for hospital follow-up for diarrhea, low K and syncope. Patient reports feeling better than he was. Patient came in to the walk-in clinic on last Thursday for swelling of his feet. Patient still has some swelling in his feet. Patient reports having a little bit of pain rating it at a 4. The pain is located in his left big toe. Patient reports that the pain comes and goes and is an aching pain. Patient reports that his diarrhea has gotten better. Patient reports that he has a BM 2-3 times a day.   Patient would like a prescription for a multi-vitamin with mineral because he buys them over the counter, but he is out and would like to get something his insurance will cover.

## 2015-02-01 NOTE — Progress Notes (Signed)
Subjective:    Patient ID: Stephen Whitaker, male    DOB: May 26, 1956, 59 y.o.   MRN: [id number]  HPI  Stephen Whitaker is a 57 year old man with a history of hypertension, Asthma, GERD who was hospitalized 6/3-6/6 for sepsis secondary to  left toe cellulitis and possible Osteomyelitis. He did have some pancytopenia which was thought to be secondary to previous use of Bactrim and he received Neupogen and IV iron Left foot xray revealed edema in distal 1.4cm of the proximal phalanx of the great toe which could be secondary to Osteomyelitis, inflammatory change or stress change; mild appearing MTP Osteoarthritis.  At his follow up office visit he was switched from Augmentin to Ciprofloxacin due to possible Osteomyelitis and given Lasix and potassium pills .Patient states his pedal edema has improved and the diarrhea he previously had has resolved. He has an upcoming appointment with Dr Sharol Given.   Past Medical History  Diagnosis Date  . Hypertension   . Pancreatitis, acute     Past Surgical History  Procedure Laterality Date  . Tonsillectomy    . Amputation Left 07/14/2014    Procedure: 2nd Toe Amputation;  Surgeon: [name], MD;  Location: [place];  Service: Orthopedics;  Laterality: Left;    History   Social History  . Marital Status: Divorced    Spouse Name: N/A  . Number of Children: N/A  . Years of Education: N/A   Occupational History  . Not on file.   Social History Main Topics  . Smoking status: Never Smoker   . Smokeless tobacco: Not on file  . Alcohol Use: No  . Drug Use: No  . Sexual Activity: Not on file   Other Topics Concern  . Not on file   Social History Narrative    Allergies  Allergen Reactions  . Shellfish Allergy Swelling    Current Outpatient Prescriptions on File Prior to Visit  Medication Sig Dispense Refill  . albuterol (PROVENTIL HFA;VENTOLIN HFA) 108 (90 BASE) MCG/ACT inhaler Inhale 2 puffs into the lungs every 6 (six) hours as needed  for wheezing or shortness of breath.    . allopurinol (ZYLOPRIM) 100 MG tablet Take 100 mg by mouth daily.     . bacitracin ointment Apply topically daily. 120 g 0  . Bioflavonoid Products (ESTER C PO) Take 1 tablet by mouth daily.    . ciprofloxacin (CIPRO) 500 MG tablet Take 1 tablet (500 mg total) by mouth 2 (two) times daily. 20 tablet 0  . clobetasol ointment (TEMOVATE) 7.82 % Apply 1 application topically 2 (two) times daily. 30 g 0  . fluticasone (FLONASE) 50 MCG/ACT nasal spray Place 1 spray into both nostrils daily.    . Fluticasone-Salmeterol (ADVAIR) 500-50 MCG/DOSE AEPB Inhale 1 puff into the lungs 2 (two) times daily. 60 each 3  . furosemide (LASIX) 40 MG tablet Take 1 tablet (40 mg total) by mouth daily. 5 tablet 0  . hydrochlorothiazide (HYDRODIURIL) 25 MG tablet Take 1 tablet (25 mg total) by mouth daily. 30 tablet 3  . HYDROcodone-acetaminophen (NORCO/VICODIN) 5-325 MG per tablet Take 1 tablet by mouth every 6 (six) hours as needed for moderate pain. 30 tablet 0  . loperamide (IMODIUM) 2 MG capsule Take 1 capsule (2 mg total) by mouth as needed for diarrhea or loose stools. 30 capsule 0  . loratadine (CLARITIN) 10 MG tablet Take 10 mg by mouth daily.    . Multiple Vitamin (MULTIVITAMIN WITH MINERALS) TABS tablet Take 1  tablet by mouth daily.    . Pancrelipase, Lip-Prot-Amyl, (CREON) 24000 UNITS CPEP Take 1 capsule (24,000 Units total) by mouth daily. 180 capsule 3  . pantoprazole (PROTONIX) 40 MG tablet Take 1 tablet (40 mg total) by mouth daily. 30 tablet 3  . potassium chloride SA (K-DUR,KLOR-CON) 20 MEQ tablet Take 1 tablet (20 mEq total) by mouth daily. 5 tablet 0  . Digestive Enzymes (DIGESTIVE ENZYME PO) Take 1 capsule by mouth 3 (three) times daily with meals. [place] brand    . OVER THE COUNTER MEDICATION Take 1 capsule by mouth 3 (three) times daily with meals. Blue/green algae    . [DISCONTINUED] COLCRYS 0.6 MG tablet      No current facility-administered medications on  file prior to visit.      ; Review of Systems \ General: negative for fever, weight loss, appetite change Eyes: no visual symptoms. ENT: no ear symptoms, no sinus tenderness, no nasal congestion or sore throat. Neck: no pain  Respiratory: no wheezing, shortness of breath, cough Cardiovascular: no chest pain, no dyspnea on exertion, no pedal edema, no orthopnea. Gastrointestinal: no abdominal pain, no diarrhea, no constipation Genito-Urinary: no urinary frequency, no dysuria, no polyuria. Hematologic: no bruising Endocrine: no cold or heat intolerance Neurological: no headaches, no seizures, no tremors Musculoskeletal: see hpi     Objective: Filed Vitals:   02/01/15 1413 02/01/15 1418  BP:  122/78  Pulse:  103  Temp:  98 F (36.7 C)  TempSrc:  Oral  Resp:  20  Height: 6' 2.5" (1.892 m)   Weight: 180 lb 6.4 oz (81.829 kg)   SpO2:  98%      Physical Exam  Constitutional: normal appearing,  Neck: normal range of motion, no thyromegaly, no JVD Cardiovascular: tachycardic rate, regular rhythm, normal heart sounds, no murmurs, rub or gallop, no pedal edema Respiratory: clear to auscultation bilaterally, no wheezes, no rales, no rhonchi Abdomen: soft, not tender to palpation, normal bowel sounds, no enlarged organs Extremities: Base of first left big toe with ulceration, no discharge noted. Skin: warm and dry, no lesions.  EXAM: MRI OF THE LEFT FOREFOOT WITHOUT CONTRAST  TECHNIQUE: Multiplanar, multisequence MR imaging was performed. No intravenous contrast was administered.  COMPARISON: Plain films of the left great toe 01/2002/ 2016 and plain films left foot 06/14/2014.  FINDINGS: There appears to be a skin ulceration on the plantar surface of the great toe just distal to the IP joint. There is marrow edema in the distal 1.4 cm of the proximal phalanx of the great toe eccentric on the medial side. The small cortical defect seen on the most recent plain films  appears well-marginated without adjacent edema.  There is first MTP joint space narrowing with mild subchondral edema in the central head of the first metatarsal consistent with osteoarthritis. Bone marrow signal is otherwise unremarkable. No fluid collection is identified. Intrinsic musculature the foot is somewhat atrophic.  IMPRESSION: Skin ulceration appears to be on the plantar surface of the great toe at or just distal to the IP joint. There is no abscess. Edema in the distal 1.4 cm of the proximal phalanx of the great toe could be secondary to osteomyelitis, inflammatory change or stress change.  Mild appearing first MTP osteoarthritis.   Electronically Signed  By: Inge Rise M.D.  On: 01/21/2015 13:21        Assessment & Plan:  59 year old male with a history of Hypertension currently managed for Early Osteomyelitis and Pancytopenia.  Left  toe cellulitis/early osteomyelitis: Wound dressing done in the ED Continue Ciprofloxacin Advised to keep appointment with Dr Sharol Given.  Pancytopenia: CBC today  Return precautions discussed.

## 2015-02-02 ENCOUNTER — Telehealth: Payer: Self-pay

## 2015-02-02 NOTE — Telephone Encounter (Signed)
-----   Message from Arnoldo Morale, MD sent at 02/02/2015  3:05 PM EDT ----- Mild improvement in anemia, liver enzymes are mildly elevated. Will monitor.

## 2015-02-02 NOTE — Telephone Encounter (Signed)
Nurse called patient, reached voicemail. Left message for patient to call Damarko Stitely at [phone].   

## 2015-02-03 ENCOUNTER — Inpatient Hospital Stay: Payer: BLUE CROSS/BLUE SHIELD | Admitting: Internal Medicine

## 2015-02-03 ENCOUNTER — Other Ambulatory Visit: Payer: Self-pay | Admitting: Family Medicine

## 2015-02-03 MED ORDER — FERROUS SULFATE 325 (65 FE) MG PO TABS: 325.0000 mg | ORAL_TABLET | Freq: Two times a day (BID) | ORAL | Status: DC

## 2015-02-03 NOTE — Telephone Encounter (Signed)
Nurse called patient, reached voicemail. Left message for patient to call Tristen Luce at [phone].   

## 2015-02-03 NOTE — Progress Notes (Unsigned)
Please inform him i have sent off ferrous sulfate to his [facility] pharmacy due to his anemia.

## 2015-02-03 NOTE — Telephone Encounter (Signed)
-----   Message from Arnoldo Morale, MD sent at 02/02/2015  3:05 PM EDT ----- Mild improvement in anemia, liver enzymes are mildly elevated. Will monitor.

## 2015-02-04 ENCOUNTER — Telehealth: Payer: Self-pay | Admitting: Internal Medicine

## 2015-02-04 ENCOUNTER — Telehealth: Payer: Self-pay

## 2015-02-04 NOTE — Telephone Encounter (Signed)
Nurse called patient, reached voicemail. Left message for patient to call Jyl Chico at [phone].   

## 2015-02-04 NOTE — Telephone Encounter (Signed)
Pt is returning call to obtain his results. Please follow up with pt. Thank you.

## 2015-02-04 NOTE — Telephone Encounter (Signed)
-----   Message from Arnoldo Morale, MD sent at 02/02/2015  3:05 PM EDT ----- Mild improvement in anemia, liver enzymes are mildly elevated. Will monitor.

## 2015-02-08 NOTE — Telephone Encounter (Signed)
Nurse called patient, patient verified date of birth. Patient aware of mild improvement in anemia and has already picked up ferrous sulfate at pharmacy.  Patient also aware of mildly elevated liver enzymes. Patient aware of monitoring liver enzymes.  Patient voices understanding and has no questions.

## 2015-02-08 NOTE — Telephone Encounter (Signed)
Nurse called patient, reached voicemail. Left message for patient to call Stephen Whitaker at [phone].   

## 2015-02-08 NOTE — Telephone Encounter (Signed)
-----   Message from Arnoldo Morale, MD sent at 02/02/2015  3:05 PM EDT ----- Mild improvement in anemia, liver enzymes are mildly elevated. Will monitor.

## 2015-02-24 ENCOUNTER — Encounter: Payer: Self-pay | Admitting: Internal Medicine

## 2015-02-24 ENCOUNTER — Ambulatory Visit: Payer: BLUE CROSS/BLUE SHIELD | Attending: Internal Medicine | Admitting: Internal Medicine

## 2015-02-24 ENCOUNTER — Inpatient Hospital Stay: Payer: BLUE CROSS/BLUE SHIELD | Admitting: Infectious Disease

## 2015-02-24 VITALS — BP 118/76 | HR 102 | Temp 98.5°F | Resp 18 | Ht 74.5 in | Wt 185.4 lb

## 2015-02-24 DIAGNOSIS — I10 Essential (primary) hypertension: Secondary | ICD-10-CM

## 2015-02-24 DIAGNOSIS — M869 Osteomyelitis, unspecified: Secondary | ICD-10-CM | POA: Diagnosis not present

## 2015-02-24 DIAGNOSIS — Z1211 Encounter for screening for malignant neoplasm of colon: Secondary | ICD-10-CM | POA: Diagnosis not present

## 2015-02-24 NOTE — Progress Notes (Signed)
Patient ID: Stephen Whitaker, male   DOB: September 04, 1955, 59 y.o.   MRN: [id number]   Stephen Whitaker, is a 59 y.o. male  FXT:[id number]  GDJ:[id number]  DOB - [birth year]  Chief Complaint  Patient presents with  . Follow-up        Subjective:   Stephen Whitaker is a 59 y.o. male with history of hypertension, COPD, chronic pancreatitis and previous amputation of left foot second toe from osteomyelitis here today for follow up. He had had repeated infection of the left 2nd toe and has been on different antibiotics. Patient denies any pain today. Patient reports his toe has been feeling fine. He has no fever. Wound is healing better. Patient needs refills on advair. Patient called it in but they were out. If they are still out of stock but have samples, he would like that for the mean time. Patient has No headache, No chest pain, No abdominal pain - No Nausea, No new weakness tingling or numbness.  ALLERGIES: Allergies  Allergen Reactions  . Shellfish Allergy Swelling    PAST MEDICAL HISTORY: Past Medical History  Diagnosis Date  . Hypertension   . Pancreatitis, acute     MEDICATIONS AT HOME: Prior to Admission medications   Medication Sig Start Date End Date Taking? Authorizing Provider  albuterol (PROVENTIL HFA;VENTOLIN HFA) 108 (90 BASE) MCG/ACT inhaler Inhale 2 puffs into the lungs every 6 (six) hours as needed for wheezing or shortness of breath.   Yes Historical Provider, MD  allopurinol (ZYLOPRIM) 100 MG tablet Take 100 mg by mouth daily.  07/19/14  Yes Historical Provider, MD  bacitracin ointment Apply topically daily. 01/24/15  Yes Nishant Dhungel, MD  Bioflavonoid Products (ESTER C PO) Take 1 tablet by mouth daily.   Yes Historical Provider, MD  clobetasol ointment (TEMOVATE) 6.22 % Apply 1 application topically 2 (two) times daily. 11/05/14  Yes Tresa Garter, MD  Digestive Enzymes (DIGESTIVE ENZYME PO) Take 1 capsule by mouth 3 (three) times daily with meals. [place] brand    Yes Historical Provider, MD  ferrous sulfate 325 (65 FE) MG tablet Take 1 tablet (325 mg total) by mouth 2 (two) times daily with a meal. 02/03/15  Yes Arnoldo Morale, MD  fluticasone (FLONASE) 50 MCG/ACT nasal spray Place 1 spray into both nostrils daily. 12/29/14  Yes Historical Provider, MD  Fluticasone-Salmeterol (ADVAIR) 500-50 MCG/DOSE AEPB Inhale 1 puff into the lungs 2 (two) times daily. 10/21/14  Yes Tresa Garter, MD  hydrochlorothiazide (HYDRODIURIL) 25 MG tablet Take 1 tablet (25 mg total) by mouth daily. 08/10/14  Yes Tresa Garter, MD  HYDROcodone-acetaminophen (NORCO/VICODIN) 5-325 MG per tablet Take 1 tablet by mouth every 6 (six) hours as needed for moderate pain. 01/24/15  Yes Nishant Dhungel, MD  ibuprofen (ADVIL,MOTRIN) 800 MG tablet Take 800 mg by mouth every 8 (eight) hours as needed.   Yes Historical Provider, MD  loperamide (IMODIUM) 2 MG capsule Take 1 capsule (2 mg total) by mouth as needed for diarrhea or loose stools. 01/24/15  Yes Nishant Dhungel, MD  loratadine (CLARITIN) 10 MG tablet Take 10 mg by mouth daily.   Yes Historical Provider, MD  Multiple Vitamin (MULTIVITAMIN WITH MINERALS) TABS tablet Take 1 tablet by mouth daily.   Yes Historical Provider, MD  OVER THE COUNTER MEDICATION Take 1 capsule by mouth 3 (three) times daily with meals. Blue/green algae   Yes Historical Provider, MD  Pancrelipase, Lip-Prot-Amyl, (CREON) 24000 UNITS CPEP Take 1 capsule (24,000 Units total) by mouth  daily. 08/10/14  Yes Tresa Garter, MD  pantoprazole (PROTONIX) 40 MG tablet Take 1 tablet (40 mg total) by mouth daily. 08/10/14  Yes Tresa Garter, MD  potassium chloride SA (K-DUR,KLOR-CON) 20 MEQ tablet Take 1 tablet (20 mEq total) by mouth daily. 01/27/15  Yes Tiffany Daneil Dan, PA-C  ciprofloxacin (CIPRO) 500 MG tablet Take 1 tablet (500 mg total) by mouth 2 (two) times daily. Patient not taking: Reported on 02/24/2015 01/27/15   Brayton Caves, PA-C  furosemide (LASIX) 40 MG  tablet Take 1 tablet (40 mg total) by mouth daily. Patient not taking: Reported on 02/24/2015 01/27/15   Brayton Caves, PA-C  multivitamin (ONE-A-DAY MEN'S) TABS tablet Take 1 tablet by mouth daily. Patient not taking: Reported on 02/24/2015 02/01/15   Arnoldo Morale, MD     Objective:   Filed Vitals:   02/24/15 1534  BP: 118/76  Pulse: 102  Temp: 98.5 F (36.9 C)  TempSrc: Oral  Resp: 18  Height: 6' 2.5" (1.892 m)  Weight: 185 lb 6.4 oz (84.097 kg)  SpO2: 99%    Exam General appearance : Awake, alert, not in any distress. Speech Clear. Not toxic looking HEENT: Atraumatic and Normocephalic, pupils equally reactive to light and accomodation Neck: supple, no JVD. No cervical lymphadenopathy.  Chest:Good air entry bilaterally, no added sounds  CVS: S1 S2 regular, no murmurs.  Abdomen: Bowel sounds present, Non tender and not distended with no gaurding, rigidity or rebound. Extremities: B/L Lower Ext shows no edema, both legs are warm to touch. Healing big toe wound, dressing clean and dry Neurology: Awake alert, and oriented X 3, CN II-XII intact, Non focal   Data Review Lab Results  Component Value Date   HGBA1C 5.6 08/10/2014     Assessment & Plan   1. Osteomyelitis  Continue current management Follow up with orthopedics as scheduled  2. Essential hypertension  - We have discussed target BP range and blood pressure goal - I have advised patient to check BP regularly and to call us back or report to clinic if the numbers are consistently higher than 140/90  - We discussed the importance of compliance with medical therapy and DASH diet recommended, consequences of uncontrolled hypertension discussed.  - continue current BP medications  3. Colon cancer screening  - Ambulatory referral to Gastroenterology  Patient have been counseled extensively about nutrition and exercise  Return in about 3 months (around 05/27/2015) for Follow up HTN, Follow up Pain and  comorbidities.  The patient was given clear instructions to go to ER or return to medical center if symptoms don't improve, worsen or new problems develop. The patient verbalized understanding. The patient was told to call to get lab results if they haven't heard anything in the next week.   This note has been created with Surveyor, quantity. Any transcriptional errors are unintentional.    Angelica Chessman, MD, [place], [place], [place], [place] and [place], [place]   02/24/2015, 4:13 PM

## 2015-02-24 NOTE — Patient Instructions (Signed)
Colonoscopy  A colonoscopy is an exam to look at the entire large intestine (colon). This exam can help find problems such as tumors, polyps, inflammation, and areas of bleeding. The exam takes about 1 hour.   LET YOUR HEALTH CARE PROVIDER KNOW ABOUT:   · Any allergies you have.  · All medicines you are taking, including vitamins, herbs, eye drops, creams, and over-the-counter medicines.  · Previous problems you or members of your family have had with the use of anesthetics.  · Any blood disorders you have.  · Previous surgeries you have had.  · Medical conditions you have.  RISKS AND COMPLICATIONS   Generally, this is a safe procedure. However, as with any procedure, complications can occur. Possible complications include:  · Bleeding.  · Tearing or rupture of the colon wall.  · Reaction to medicines given during the exam.  · Infection (rare).  BEFORE THE PROCEDURE   · Ask your health care provider about changing or stopping your regular medicines.  · You may be prescribed an oral bowel prep. This involves drinking a large amount of medicated liquid, starting the day before your procedure. The liquid will cause you to have multiple loose stools until your stool is almost clear or light green. This cleans out your colon in preparation for the procedure.  · Do not eat or drink anything else once you have started the bowel prep, unless your health care provider tells you it is safe to do so.  · Arrange for someone to drive you home after the procedure.  PROCEDURE   · You will be given medicine to help you relax (sedative).  · You will lie on your side with your knees bent.  · A long, flexible tube with a light and camera on the end (colonoscope) will be inserted through the rectum and into the colon. The camera sends video back to a computer screen as it moves through the colon. The colonoscope also releases carbon dioxide gas to inflate the colon. This helps your health care provider see the area better.  · During  the exam, your health care provider may take a small tissue sample (biopsy) to be examined under a microscope if any abnormalities are found.  · The exam is finished when the entire colon has been viewed.  AFTER THE PROCEDURE   · Do not drive for 24 hours after the exam.  · You may have a small amount of blood in your stool.  · You may pass moderate amounts of gas and have mild abdominal cramping or bloating. This is caused by the gas used to inflate your colon during the exam.  · Ask when your test results will be ready and how you will get your results. Make sure you get your test results.  Document Released: 08/03/2000 Document Revised: 05/27/2013 Document Reviewed: 04/13/2013  ExitCare® Patient Information ©2015 [employer]. This information is not intended to replace advice given to you by your health care provider. Make sure you discuss any questions you have with your health care provider.

## 2015-02-24 NOTE — Progress Notes (Signed)
Patient here for follow up for toe infection in left big toe. Patient denies any pain today. Patient reports when his toe is in pain it is aching pain. Patient reports his toe has been feeling fine.   Patient needs refills on advair. Patient called it in but they were out. If they are still out of stock but have samples, he would like that for the mean time.

## 2015-02-28 ENCOUNTER — Inpatient Hospital Stay: Payer: BLUE CROSS/BLUE SHIELD | Admitting: Infectious Disease

## 2015-03-09 ENCOUNTER — Inpatient Hospital Stay: Payer: BLUE CROSS/BLUE SHIELD | Admitting: Infectious Disease

## 2015-06-20 ENCOUNTER — Inpatient Hospital Stay: Payer: BLUE CROSS/BLUE SHIELD | Admitting: Internal Medicine

## 2015-06-28 ENCOUNTER — Inpatient Hospital Stay: Payer: BLUE CROSS/BLUE SHIELD | Admitting: Internal Medicine

## 2015-07-05 ENCOUNTER — Ambulatory Visit (INDEPENDENT_AMBULATORY_CARE_PROVIDER_SITE_OTHER): Payer: BLUE CROSS/BLUE SHIELD | Admitting: Internal Medicine

## 2015-07-05 ENCOUNTER — Encounter: Payer: Self-pay | Admitting: Internal Medicine

## 2015-07-05 DIAGNOSIS — L97522 Non-pressure chronic ulcer of other part of left foot with fat layer exposed: Secondary | ICD-10-CM | POA: Diagnosis not present

## 2015-07-05 LAB — CBC
HCT: 37.5 % — ABNORMAL LOW (ref 39.0–52.0)
Hemoglobin: 12 g/dL — ABNORMAL LOW (ref 13.0–17.0)
MCH: 26.9 pg (ref 26.0–34.0)
MCHC: 32 g/dL (ref 30.0–36.0)
MCV: 84.1 fL (ref 78.0–100.0)
MPV: 9.4 fL (ref 8.6–12.4)
PLATELETS: 251 10*3/uL (ref 150–400)
RBC: 4.46 MIL/uL (ref 4.22–5.81)
RDW: 14.1 % (ref 11.5–15.5)
WBC: 2.8 10*3/uL — AB (ref 4.0–10.5)

## 2015-07-05 LAB — C-REACTIVE PROTEIN: CRP: 1.8 mg/dL — ABNORMAL HIGH (ref <0.60)

## 2015-07-05 MED ORDER — POTASSIUM CHLORIDE CRYS ER 20 MEQ PO TBCR: 20.0000 meq | EXTENDED_RELEASE_TABLET | Freq: Every day | ORAL | Status: DC

## 2015-07-05 NOTE — Progress Notes (Signed)
[facility]  Patient Active Problem List   Diagnosis Date Noted  . Ulcer of great toe ([place])     Priority: High  . Other pancytopenia ([place]) 01/24/2015  . Chronic pancreatitis ([place]) 01/24/2015  . Osteomyelitis ([place])   . Blood poisoning ([place])   . Neutropenia ([place])   . Iron deficiency anemia due to chronic blood loss   . Leukopenia   . Sepsis ([place]) 01/21/2015  . Leucopenia 01/21/2015  . Anemia 01/21/2015  . Syncope 01/21/2015  . Diarrhea 01/21/2015  . Hypokalemia 01/21/2015  . Loss of consciousness   . Eczema 10/21/2014  . Essential hypertension 10/21/2014  . Vasovagal syncope 10/21/2014  . Bilateral conjunctivitis 08/10/2014  . Other chronic pancreatitis ([place]) 08/10/2014  . COLD (chronic obstructive lung disease) ([place]) 08/10/2014  . Chronic ethmoidal sinusitis 08/10/2014  . BRONCHITIS 10/17/2010  . PLANTAR FASCIITIS, LEFT 10/17/2010  . OTHER NEUTROPENIA 04/21/2010  . CALLUS, TOE 03/15/2010  . LEUKOPENIA, MILD 02/20/2010  . LIVER FUNCTION TESTS, ABNORMAL, HX OF 02/16/2010  . DENTAL CARIES 02/15/2010  . ONYCHOMYCOSIS 07/05/2009  . ALLERGIC URTICARIA 07/05/2009  . PAIN IN LIMB 07/05/2009  . ERECTILE DYSFUNCTION 05/25/2008  . INFLUENZA 05/25/2008  . VIRAL INFECTION 10/01/2007  . HSV 05/02/2007  . CONDYLOMA ACUMINATUM 05/02/2007  . HYPERTENSION 05/02/2007  . ALLERGIC RHINITIS 05/02/2007  . ASTHMA 05/02/2007  . GERD 05/02/2007  . PANCREATITIS 05/02/2007    Patient's Medications  New Prescriptions   No medications on file  Previous Medications   ALBUTEROL (PROVENTIL HFA;VENTOLIN HFA) 108 (90 BASE) MCG/ACT INHALER    Inhale 2 puffs into the lungs every 6 (six) hours as needed for wheezing or shortness of breath.   ALLOPURINOL (ZYLOPRIM) 100 MG TABLET    Take 100 mg by mouth daily.    BIOFLAVONOID PRODUCTS (ESTER C PO)    Take 1 tablet by mouth daily.   CLOBETASOL OINTMENT (TEMOVATE) 0.05 %    Apply 1 application topically 2 (two)  times daily.   DIGESTIVE ENZYMES (DIGESTIVE ENZYME PO)    Take 1 capsule by mouth 3 (three) times daily with meals. [employer] brand   FERROUS SULFATE 325 (65 FE) MG TABLET    Take 1 tablet (325 mg total) by mouth 2 (two) times daily with a meal.   FLUTICASONE (FLONASE) 50 MCG/ACT NASAL SPRAY    Place 1 spray into both nostrils daily.   FLUTICASONE-SALMETEROL (ADVAIR) 500-50 MCG/DOSE AEPB    Inhale 1 puff into the lungs 2 (two) times daily.   FUROSEMIDE (LASIX) 40 MG TABLET    Take 1 tablet (40 mg total) by mouth daily.   HYDROCHLOROTHIAZIDE (HYDRODIURIL) 25 MG TABLET    Take 1 tablet (25 mg total) by mouth daily.   HYDROCODONE-ACETAMINOPHEN (NORCO/VICODIN) 5-325 MG PER TABLET    Take 1 tablet by mouth every 6 (six) hours as needed for moderate pain.   IBUPROFEN (ADVIL,MOTRIN) 800 MG TABLET    Take 800 mg by mouth every 8 (eight) hours as needed.   LOPERAMIDE (IMODIUM) 2 MG CAPSULE    Take 1 capsule (2 mg total) by mouth as needed for diarrhea or loose stools.   LORATADINE (CLARITIN) 10 MG TABLET    Take 10 mg by mouth daily.   MULTIPLE VITAMIN (MULTIVITAMIN WITH MINERALS) TABS TABLET    Take 1 tablet by mouth daily.   MULTIVITAMIN (ONE-A-DAY MEN'S) TABS TABLET    Take 1 tablet by mouth daily.   OVER THE  COUNTER MEDICATION    Take 1 capsule by mouth 3 (three) times daily with meals. Blue/green algae   PANCRELIPASE, LIP-PROT-AMYL, (CREON) 24000 UNITS CPEP    Take 1 capsule (24,000 Units total) by mouth daily.   PANTOPRAZOLE (PROTONIX) 40 MG TABLET    Take 1 tablet (40 mg total) by mouth daily.  Modified Medications   Modified Medication Previous Medication   POTASSIUM CHLORIDE SA (K-DUR,KLOR-CON) 20 MEQ TABLET potassium chloride SA (K-DUR,KLOR-CON) 20 MEQ tablet      Take 1 tablet (20 mEq total) by mouth daily.    Take 1 tablet (20 mEq total) by mouth daily.  Discontinued Medications   BACITRACIN OINTMENT    Apply topically daily.   CIPROFLOXACIN (CIPRO) 500 MG TABLET    Take 1 tablet (500 mg total) by  mouth 2 (two) times daily.    Subjective: Stephen Whitaker is in for his hospital follow-up visit. He's had recurrent foot ulcers and was hospitalized in June with an ulcer on the plantar surface of his left great toe. MRI revealed some edema in the proximal phalanx but no definitive evidence of osteomyelitis. There was no abscess. He was seen by my partner, Dr. Carlyle Basques, who elected to treat him for possible soft tissue infection with 2 weeks of amoxicillin clavulanate. He was also noted to be leukopenic at that time. It was not clear if this was related to his recent treatment with trimethoprim sulfamethoxazole. His white blood cell count was improving upon discharge. He states he's been bothered by gout in his left ankle recently. He states that [name] put him on doxycycline about 2 weeks ago. He is not sure why that was started. He has not had any fever, chills or sweats. He has not had any increase or change in the clear to sometimes bloody drainage from the ulcer. He is not having swelling, redness or warmth in his toe. He states that he was seen by one of [name] assistance yesterday and had the skin around the ulcer trimmed. An x-ray was done and he was told that there was no evidence of bone infection.  Review of Systems: Review of Systems  Constitutional: Negative for fever, chills and diaphoresis.  Skin: Negative for rash.  Neurological: Positive for sensory change.  Psychiatric/Behavioral: Negative for depression. The patient is not nervous/anxious.     Past Medical History  Diagnosis Date  . Hypertension   . Pancreatitis, acute     Social History  Substance Use Topics  . Smoking status: Never Smoker   . Smokeless tobacco: None  . Alcohol Use: No    Family History  Problem Relation Age of Onset  . Adopted: Yes    Allergies  Allergen Reactions  . Shellfish Allergy Swelling    Objective: Filed Vitals:   07/05/15 1529  BP: 120/76  Pulse: 97  Temp: 98.2 F  (36.8 C)  TempSrc: Oral  Weight: 194 lb 4 oz (88.111 kg)   Body mass index is 24.61 kg/(m^2).  Physical Exam  Constitutional:  He is well dressed and in no distress.  Musculoskeletal:  He has diffuse swelling around his left ankle without redness or warmth. It is not particularly tender with range of motion. He states the swelling is acute and he believes it is related to his gout.  He has a bland appearing ulcer on the plantar surface of his left great toe. There is a scant amount of bloody drainage. There is no odor. There is no visible tendon  or bone. There is no surrounding cellulitis or fluctuance. He has thickened toenails. His second toe is surgically absent.  Psychiatric: Mood and affect normal.    Lab Results    Problem List Items Addressed This Visit      High   Ulcer of great toe ([place])    I do not see clear evidence of active infection at this time. I've asked him to sign a release of information so we can get records from [name] office including the recent x-ray report. I will recheck his CBC to see if his leukopenia has resolved and also check a sedimentation rate and C-reactive protein today. I see no current indication for antibiotics. He will follow-up with me in 4 weeks.      Relevant Orders   CBC   C-reactive protein   Sedimentation rate       Michel Bickers, MD [facility] [place] [phone] pager   [phone] cell 07/05/2015, 4:53 PM

## 2015-07-05 NOTE — Assessment & Plan Note (Signed)
I do not see clear evidence of active infection at this time. I've asked him to sign a release of information so we can get records from Dr. Jess Barters office including the recent x-ray report. I will recheck his CBC to see if his leukopenia has resolved and also check a sedimentation rate and C-reactive protein today. I see no current indication for antibiotics. He will follow-up with me in 4 weeks.

## 2015-07-06 LAB — SEDIMENTATION RATE: SED RATE: 27 mm/h — AB (ref 0–20)

## 2015-07-22 ENCOUNTER — Other Ambulatory Visit: Payer: Self-pay | Admitting: Internal Medicine

## 2015-08-01 ENCOUNTER — Ambulatory Visit: Payer: BLUE CROSS/BLUE SHIELD | Admitting: Internal Medicine

## 2015-08-01 ENCOUNTER — Other Ambulatory Visit: Payer: Self-pay | Admitting: *Deleted

## 2015-08-01 MED ORDER — POTASSIUM CHLORIDE CRYS ER 20 MEQ PO TBCR: 20.0000 meq | EXTENDED_RELEASE_TABLET | Freq: Every day | ORAL | Status: DC

## 2015-08-10 ENCOUNTER — Ambulatory Visit: Payer: BLUE CROSS/BLUE SHIELD | Admitting: Internal Medicine

## 2015-08-10 ENCOUNTER — Telehealth: Payer: Self-pay | Admitting: Internal Medicine

## 2015-08-10 NOTE — Telephone Encounter (Signed)
hydrochlorothiazide (HYDRODIURIL) 25 MG tablet pantoprazole (PROTONIX) 40 MG tablet And singulair for his allergies

## 2015-08-12 ENCOUNTER — Other Ambulatory Visit: Payer: Self-pay | Admitting: *Deleted

## 2015-08-12 DIAGNOSIS — I10 Essential (primary) hypertension: Secondary | ICD-10-CM

## 2015-08-12 DIAGNOSIS — K861 Other chronic pancreatitis: Secondary | ICD-10-CM

## 2015-08-12 MED ORDER — PANTOPRAZOLE SODIUM 40 MG PO TBEC: 40.0000 mg | DELAYED_RELEASE_TABLET | Freq: Every day | ORAL | Status: DC

## 2015-08-12 MED ORDER — HYDROCHLOROTHIAZIDE 25 MG PO TABS: 25.0000 mg | ORAL_TABLET | Freq: Every day | ORAL | Status: DC

## 2015-08-12 NOTE — Telephone Encounter (Signed)
He needs a follow up and needs to have a BMET as well while on HCTZ. Only sending 30 day supply

## 2015-08-13 ENCOUNTER — Emergency Department (INDEPENDENT_AMBULATORY_CARE_PROVIDER_SITE_OTHER)
Admission: EM | Admit: 2015-08-13 | Discharge: 2015-08-13 | Disposition: A | Discharge status: Home / Self Care | Payer: BLUE CROSS/BLUE SHIELD | Source: Home / Self Care | Attending: Emergency Medicine | Admitting: Emergency Medicine

## 2015-08-13 ENCOUNTER — Emergency Department (INDEPENDENT_AMBULATORY_CARE_PROVIDER_SITE_OTHER): Payer: BLUE CROSS/BLUE SHIELD

## 2015-08-13 ENCOUNTER — Encounter (HOSPITAL_COMMUNITY): Payer: Self-pay

## 2015-08-13 DIAGNOSIS — J42 Unspecified chronic bronchitis: Secondary | ICD-10-CM

## 2015-08-13 DIAGNOSIS — J209 Acute bronchitis, unspecified: Secondary | ICD-10-CM

## 2015-08-13 HISTORY — DX: Gout, unspecified: M10.9

## 2015-08-13 HISTORY — DX: Unspecified chronic bronchitis: J42

## 2015-08-13 MED ORDER — PREDNISONE 50 MG PO TABS: 50.0000 mg | ORAL_TABLET | Freq: Every day | ORAL | Status: DC

## 2015-08-13 MED ORDER — AZITHROMYCIN 500 MG PO TABS: 500.0000 mg | ORAL_TABLET | Freq: Every day | ORAL | Status: DC

## 2015-08-13 MED ORDER — ALBUTEROL SULFATE HFA 108 (90 BASE) MCG/ACT IN AERS: 2.0000 | INHALATION_SPRAY | RESPIRATORY_TRACT | Status: DC | PRN

## 2015-08-13 NOTE — ED Notes (Signed)
Pt has chronic bronchitis with productive cough, sneezing and headaches. Pt alert and oriented

## 2015-08-13 NOTE — Discharge Instructions (Signed)
2 puffs from your albuterol inhaler as needed for coughing, wheezing, shortness of breath. Follow-up with your primary care physician. Go to the ER if you get worse.

## 2015-08-13 NOTE — ED Provider Notes (Signed)
HPI  SUBJECTIVE:  Stephen Whitaker is a 59 y.o. male who presents with 2 days cough productive of yellowish sputum, shortness of breath described as inability take a deep breath then slightly less than normal, wheezing, chest pain. He states his sputum has changed in amount and color. He reports nasal congestion, postnasal drip, rhinorrhea, fatigue. Symptoms are worse with exercise, better with inhalers and ibuprofen. He takes his Advair twice a day and Proventil twice a day. He has not used his albuterol more than that. No dyspnea on exertion, nausea, vomiting, fevers, headache, body aches. No hemoptysis. No ear pain. No abdominal pain. No lower extremity edema, bacteria, PND, orthopnea, unintentional weight gain. He states this feels similar to previous COPD/chronic bronchitis exacerbations. He did get a flu shot this year. Past medical history of hypertension, pancreatitis, gallops. No history of diabetes, CHF.    Past Medical History  Diagnosis Date  . Hypertension   . Pancreatitis, acute   . Chronic bronchitis ([place])   . Gout     Past Surgical History  Procedure Laterality Date  . Tonsillectomy    . Amputation Left 07/14/2014    Procedure: 2nd Toe Amputation;  Surgeon: [name], MD;  Location: [facility] [place];  Service: Orthopedics;  Laterality: Left;    Family History  Problem Relation Age of Onset  . Adopted: Yes    Social History  Substance Use Topics  . Smoking status: Never Smoker   . Smokeless tobacco: None  . Alcohol Use: No    No current facility-administered medications for this encounter.  Current outpatient prescriptions:  .  colchicine (COLCRYS) 0.6 MG tablet, Take 0.6 mg by mouth daily., Disp: , Rfl:  .  albuterol (PROVENTIL HFA;VENTOLIN HFA) 108 (90 BASE) MCG/ACT inhaler, Inhale 2 puffs into the lungs every 6 (six) hours as needed for wheezing or shortness of breath., Disp: , Rfl:  .  albuterol (PROVENTIL HFA;VENTOLIN HFA) 108 (90 BASE) MCG/ACT inhaler, Inhale  2 puffs into the lungs every 4 (four) hours as needed for wheezing or shortness of breath. Dispense with aerochamber, Disp: 1 Inhaler, Rfl: 0 .  allopurinol (ZYLOPRIM) 100 MG tablet, Take 100 mg by mouth daily. , Disp: , Rfl:  .  azithromycin (ZITHROMAX) 500 MG tablet, Take 1 tablet (500 mg total) by mouth daily., Disp: 5 tablet, Rfl: 0 .  Bioflavonoid Products (ESTER C PO), Take 1 tablet by mouth daily., Disp: , Rfl:  .  clobetasol ointment (TEMOVATE) [id number] %, Apply 1 application topically 2 (two) times daily., Disp: 30 g, Rfl: 0 .  Digestive Enzymes (DIGESTIVE ENZYME PO), Take 1 capsule by mouth 3 (three) times daily with meals. [employer] brand, Disp: , Rfl:  .  ferrous sulfate 325 (65 FE) MG tablet, Take 1 tablet (325 mg total) by mouth 2 (two) times daily with a meal. (Patient not taking: Reported on 07/05/2015), Disp: 60 tablet, Rfl: 3 .  fluticasone (FLONASE) 50 MCG/ACT nasal spray, Place 1 spray into both nostrils daily., Disp: , Rfl:  .  Fluticasone-Salmeterol (ADVAIR) 500-50 MCG/DOSE AEPB, Inhale 1 puff into the lungs 2 (two) times daily., Disp: 60 each, Rfl: 3 .  hydrochlorothiazide (HYDRODIURIL) 25 MG tablet, Take 1 tablet (25 mg total) by mouth daily., Disp: 30 tablet, Rfl: 0 .  ibuprofen (ADVIL,MOTRIN) 800 MG tablet, Take 800 mg by mouth every 8 (eight) hours as needed., Disp: , Rfl:  .  Multiple Vitamin (MULTIVITAMIN WITH MINERALS) TABS tablet, Take 1 tablet by mouth daily., Disp: , Rfl:  .  OVER THE COUNTER MEDICATION, Take 1 capsule by mouth 3 (three) times daily with meals. Blue/green algae, Disp: , Rfl:  .  Pancrelipase, Lip-Prot-Amyl, (CREON) 24000 UNITS CPEP, Take 1 capsule (24,000 Units total) by mouth daily., Disp: 180 capsule, Rfl: 3 .  pantoprazole (PROTONIX) 40 MG tablet, Take 1 tablet (40 mg total) by mouth daily., Disp: 30 tablet, Rfl: 0 .  potassium chloride SA (K-DUR,KLOR-CON) 20 MEQ tablet, Take 1 tablet (20 mEq total) by mouth daily., Disp: 5 tablet, Rfl: 0 .  predniSONE  (DELTASONE) 50 MG tablet, Take 1 tablet (50 mg total) by mouth daily with breakfast., Disp: 5 tablet, Rfl: 0 .  [DISCONTINUED] furosemide (LASIX) 40 MG tablet, Take 1 tablet (40 mg total) by mouth daily. (Patient not taking: Reported on 02/24/2015), Disp: 5 tablet, Rfl: 0 .  [DISCONTINUED] loratadine (CLARITIN) 10 MG tablet, Take 10 mg by mouth daily., Disp: , Rfl:   Allergies  Allergen Reactions  . Shellfish Allergy Swelling     ROS  As noted in HPI.   Physical Exam  BP 113/76 mmHg  Pulse 98  Temp(Src) 98.6 F (37 C) (Oral)  SpO2 100%  Constitutional: Well developed, well nourished, no acute distress Eyes: PERRL, EOMI, conjunctiva normal bilaterally HENT: Normocephalic, atraumatic,mucus membranes moist. Normal TMs. Erythematous, swollen turbinates with purulent nasal drainage. No sinus tenderness. Cobblestoning oropharynx. Normal tonsils. No postnasal drip Lymph: No cervical lymphadenopathy Respiratory: Fair air movement, Clear to auscultation bilaterally, no rales, no wheezing, no rhonchi Cardiovascular: Normal rate and rhythm, no murmurs, no gallops, no rubs GI: nondistended skin: No rash, skin intact Musculoskeletal: Calves symmetric, nontender, No edema, no tenderness, no deformities Neurologic: Alert & oriented x 3, CN II-XII grossly intact, no motor deficits, sensation grossly intact Psychiatric: Speech and behavior appropriate   ED Course   Medications - No data to display  Orders Placed This Encounter  Procedures  . DG Chest 2 View    Standing Status: Standing     Number of Occurrences: 1     Standing Expiration Date:     Order Specific Question:  Reason for Exam (SYMPTOM  OR DIAGNOSIS REQUIRED)    Answer:  COPD w/prod cough x 2 days r/o PNA   No results found for this or any previous visit (from the past 24 hour(s)). Dg Chest 2 View  08/13/2015  CLINICAL DATA:  Chronic bronchitis. Sick for several days with cough and bloody mucus. Nonsmoker. EXAM: CHEST  2  VIEW COMPARISON:  01/21/2015 and 11/13/2014. FINDINGS: The heart size and mediastinal contours are normal. The lungs are clear. There is no pleural effusion or pneumothorax. No acute osseous findings are identified. IMPRESSION: Stable chest.  No acute cardiopulmonary process. Electronically Signed   By: Richardean Sale M.D.   On: 08/13/2015 17:01    ED Clinical Impression  Acute exacerbation of chronic bronchitis [facility])  ED Assessment/Plan  Reviewed chest x-ray. No pneumonia.  See radiology report for full details.  Patient to start saline nasal irrigation continue Flonase for nasal congestion. Patient meets criteria for acute exacerbation of chronic bronchitis. We'll send home with high dose azithromycin 500 mg for 5 days, regular albuterol, 5 days of oral steroids. Follow-up with primary care physician as needed. Return to the ER if gets worse.   Discussed imaging, MDM, plan and followup with patient. Discussed sn/sx that should prompt return to the UC or ED. Patient agrees with plan.  *This clinic note was created using Dragon dictation software. Therefore, there may be occasional mistakes  despite careful proofreading.  ?  Melynda Ripple, MD 08/13/15 1714

## 2015-08-20 ENCOUNTER — Encounter (HOSPITAL_COMMUNITY): Payer: Self-pay | Admitting: *Deleted

## 2015-08-20 ENCOUNTER — Emergency Department (INDEPENDENT_AMBULATORY_CARE_PROVIDER_SITE_OTHER)
Admission: EM | Admit: 2015-08-20 | Discharge: 2015-08-20 | Disposition: A | Discharge status: Home / Self Care | Payer: BLUE CROSS/BLUE SHIELD | Source: Home / Self Care

## 2015-08-20 DIAGNOSIS — J069 Acute upper respiratory infection, unspecified: Secondary | ICD-10-CM | POA: Diagnosis not present

## 2015-08-20 NOTE — Discharge Instructions (Signed)
Plenty of fluids, see your doctor if further problems

## 2015-08-20 NOTE — ED Provider Notes (Signed)
CSN: [id number]     Arrival date & time 08/20/15  1733 History   None    No chief complaint on file.  (Consider location/radiation/quality/duration/timing/severity/associated sxs/prior Treatment) Patient is a 59 y.o. male presenting with cough. The history is provided by the patient.  Cough Cough characteristics:  Dry and non-productive Severity:  Mild Chronicity:  New Smoker: no   Context comment:  Seen 12/24 and eval with meds given, sx improved. Associated symptoms: no fever, no shortness of breath and no wheezing     Past Medical History  Diagnosis Date  . Hypertension   . Pancreatitis, acute   . Chronic bronchitis ([place])   . Gout    Past Surgical History  Procedure Laterality Date  . Tonsillectomy    . Amputation Left 07/14/2014    Procedure: 2nd Toe Amputation;  Surgeon: [name], MD;  Location: [place];  Service: Orthopedics;  Laterality: Left;   Family History  Problem Relation Age of Onset  . Adopted: Yes   Social History  Substance Use Topics  . Smoking status: Never Smoker   . Smokeless tobacco: None  . Alcohol Use: No    Review of Systems  Constitutional: Negative.  Negative for fever.  Respiratory: Positive for cough. Negative for shortness of breath and wheezing.   Cardiovascular: Negative.   All other systems reviewed and are negative.   Allergies  Shellfish allergy  Home Medications   Prior to Admission medications   Medication Sig Start Date End Date Taking? Authorizing Provider  albuterol (PROVENTIL HFA;VENTOLIN HFA) 108 (90 BASE) MCG/ACT inhaler Inhale 2 puffs into the lungs every 6 (six) hours as needed for wheezing or shortness of breath.    Historical Provider, MD  albuterol (PROVENTIL HFA;VENTOLIN HFA) 108 (90 BASE) MCG/ACT inhaler Inhale 2 puffs into the lungs every 4 (four) hours as needed for wheezing or shortness of breath. Dispense with aerochamber 08/13/15   Melynda Ripple, MD  allopurinol (ZYLOPRIM) 100 MG tablet Take 100 mg  by mouth daily.  07/19/14   Historical Provider, MD  azithromycin (ZITHROMAX) 500 MG tablet Take 1 tablet (500 mg total) by mouth daily. 08/13/15   Melynda Ripple, MD  Bioflavonoid Products (ESTER C PO) Take 1 tablet by mouth daily.    Historical Provider, MD  clobetasol ointment (TEMOVATE) AB-123456789 % Apply 1 application topically 2 (two) times daily. 11/05/14   Tresa Garter, MD  colchicine (COLCRYS) 0.6 MG tablet Take 0.6 mg by mouth daily.    Historical Provider, MD  Digestive Enzymes (DIGESTIVE ENZYME PO) Take 1 capsule by mouth 3 (three) times daily with meals. [employer] brand    Historical Provider, MD  ferrous sulfate 325 (65 FE) MG tablet Take 1 tablet (325 mg total) by mouth 2 (two) times daily with a meal. Patient not taking: Reported on 07/05/2015 02/03/15   Arnoldo Morale, MD  fluticasone (FLONASE) 50 MCG/ACT nasal spray Place 1 spray into both nostrils daily. 12/29/14   Historical Provider, MD  Fluticasone-Salmeterol (ADVAIR) 500-50 MCG/DOSE AEPB Inhale 1 puff into the lungs 2 (two) times daily. 10/21/14   Tresa Garter, MD  hydrochlorothiazide (HYDRODIURIL) 25 MG tablet Take 1 tablet (25 mg total) by mouth daily. 08/12/15   Lance Bosch, NP  ibuprofen (ADVIL,MOTRIN) 800 MG tablet Take 800 mg by mouth every 8 (eight) hours as needed.    Historical Provider, MD  Multiple Vitamin (MULTIVITAMIN WITH MINERALS) TABS tablet Take 1 tablet by mouth daily.    Historical Provider, MD  OVER  THE COUNTER MEDICATION Take 1 capsule by mouth 3 (three) times daily with meals. Blue/green algae    Historical Provider, MD  Pancrelipase, Lip-Prot-Amyl, (CREON) 24000 UNITS CPEP Take 1 capsule (24,000 Units total) by mouth daily. 08/10/14   Tresa Garter, MD  pantoprazole (PROTONIX) 40 MG tablet Take 1 tablet (40 mg total) by mouth daily. 08/12/15   Lance Bosch, NP  potassium chloride SA (K-DUR,KLOR-CON) 20 MEQ tablet Take 1 tablet (20 mEq total) by mouth daily. 08/01/15   Tresa Garter, MD   predniSONE (DELTASONE) 50 MG tablet Take 1 tablet (50 mg total) by mouth daily with breakfast. 08/13/15   Melynda Ripple, MD   Meds Ordered and Administered this Visit  Medications - No data to display  BP 126/84 mmHg  Pulse 78  Temp(Src) 98.1 F (36.7 C) (Oral)  Resp 20  SpO2 99% No data found.   Physical Exam  Constitutional: He is oriented to person, place, and time. He appears well-developed and well-nourished. No distress.  HENT:  Mouth/Throat: Oropharynx is clear and moist.  Neck: Normal range of motion. Neck supple.  Cardiovascular: Normal heart sounds and intact distal pulses.   Pulmonary/Chest: Effort normal and breath sounds normal.  Lymphadenopathy:    He has no cervical adenopathy.  Neurological: He is alert and oriented to person, place, and time.  Skin: Skin is warm and dry.  Nursing note and vitals reviewed.   ED Course  Procedures (including critical care time)  Labs Review Labs Reviewed - No data to display  Imaging Review No results found.   Visual Acuity Review  Right Eye Distance:   Left Eye Distance:   Bilateral Distance:    Right Eye Near:   Left Eye Near:    Bilateral Near:         MDM   1. URI (upper respiratory infection)        Billy Fischer, MD 08/21/15 1919

## 2015-10-11 ENCOUNTER — Other Ambulatory Visit: Payer: Self-pay | Admitting: Internal Medicine

## 2015-11-02 ENCOUNTER — Encounter (HOSPITAL_COMMUNITY): Payer: Self-pay

## 2015-11-02 ENCOUNTER — Emergency Department (INDEPENDENT_AMBULATORY_CARE_PROVIDER_SITE_OTHER)
Admission: EM | Admit: 2015-11-02 | Discharge: 2015-11-02 | Disposition: A | Discharge status: Home / Self Care | Payer: BLUE CROSS/BLUE SHIELD | Source: Home / Self Care | Attending: Family Medicine | Admitting: Family Medicine

## 2015-11-02 DIAGNOSIS — J40 Bronchitis, not specified as acute or chronic: Secondary | ICD-10-CM | POA: Diagnosis not present

## 2015-11-02 MED ORDER — PREDNISONE 50 MG PO TABS: 50.0000 mg | ORAL_TABLET | Freq: Every day | ORAL | Status: DC

## 2015-11-02 MED ORDER — AZITHROMYCIN 250 MG PO TABS: 250.0000 mg | ORAL_TABLET | Freq: Every day | ORAL | Status: DC

## 2015-11-02 NOTE — Discharge Instructions (Signed)
Upper Respiratory Infection, Adult Most upper respiratory infections (URIs) are a viral infection of the air passages leading to the lungs. A URI affects the nose, throat, and upper air passages. The most common type of URI is nasopharyngitis and is typically referred to as "the common cold." URIs run their course and usually go away on their own. Most of the time, a URI does not require medical attention, but sometimes a bacterial infection in the upper airways can follow a viral infection. This is called a secondary infection. Sinus and middle ear infections are common types of secondary upper respiratory infections. Bacterial pneumonia can also complicate a URI. A URI can worsen asthma and chronic obstructive pulmonary disease (COPD). Sometimes, these complications can require emergency medical care and may be life threatening.  CAUSES Almost all URIs are caused by viruses. A virus is a type of germ and can spread from one person to another.  RISKS FACTORS You may be at risk for a URI if:   You smoke.   You have chronic heart or lung disease.  You have a weakened defense (immune) system.   You are very young or very old.   You have nasal allergies or asthma.  You work in crowded or poorly ventilated areas.  You work in health care facilities or schools. SIGNS AND SYMPTOMS  Symptoms typically develop 2-3 days after you come in contact with a cold virus. Most viral URIs last 7-10 days. However, viral URIs from the influenza virus (flu virus) can last 14-18 days and are typically more severe. Symptoms may include:   Runny or stuffy (congested) nose.   Sneezing.   Cough.   Sore throat.   Headache.   Fatigue.   Fever.   Loss of appetite.   Pain in your forehead, behind your eyes, and over your cheekbones (sinus pain).  Muscle aches.  DIAGNOSIS  Your health care provider may diagnose a URI by:  Physical exam.  Tests to check that your symptoms are not due to  another condition such as:  Strep throat.  Sinusitis.  Pneumonia.  Asthma. TREATMENT  A URI goes away on its own with time. It cannot be cured with medicines, but medicines may be prescribed or recommended to relieve symptoms. Medicines may help:  Reduce your fever.  Reduce your cough.  Relieve nasal congestion. HOME CARE INSTRUCTIONS   Take medicines only as directed by your health care provider.   Gargle warm saltwater or take cough drops to comfort your throat as directed by your health care provider.  Use a warm mist humidifier or inhale steam from a shower to increase air moisture. This may make it easier to breathe.  Drink enough fluid to keep your urine clear or pale yellow.   Eat soups and other clear broths and maintain good nutrition.   Rest as needed.   Return to work when your temperature has returned to normal or as your health care provider advises. You may need to stay home longer to avoid infecting others. You can also use a face mask and careful hand washing to prevent spread of the virus.  Increase the usage of your inhaler if you have asthma.   Do not use any tobacco products, including cigarettes, chewing tobacco, or electronic cigarettes. If you need help quitting, ask your health care provider. PREVENTION  The best way to protect yourself from getting a cold is to practice good hygiene.   Avoid oral or hand contact with people with cold   symptoms.   Wash your hands often if contact occurs.  There is no clear evidence that vitamin C, vitamin E, echinacea, or exercise reduces the chance of developing a cold. However, it is always recommended to get plenty of rest, exercise, and practice good nutrition.  SEEK MEDICAL CARE IF:   You are getting worse rather than better.   Your symptoms are not controlled by medicine.   You have chills.  You have worsening shortness of breath.  You have brown or red mucus.  You have yellow or brown nasal  discharge.  You have pain in your face, especially when you bend forward.  You have a fever.  You have swollen neck glands.  You have pain while swallowing.  You have white areas in the back of your throat. SEEK IMMEDIATE MEDICAL CARE IF:   You have severe or persistent:  Headache.  Ear pain.  Sinus pain.  Chest pain.  You have chronic lung disease and any of the following:  Wheezing.  Prolonged cough.  Coughing up blood.  A change in your usual mucus.  You have a stiff neck.  You have changes in your:  Vision.  Hearing.  Thinking.  Mood. MAKE SURE YOU:   Understand these instructions.  Will watch your condition.  Will get help right away if you are not doing well or get worse.   This information is not intended to replace advice given to you by your health care provider. Make sure you discuss any questions you have with your health care provider.   Document Released: 01/30/2001 Document Revised: 12/21/2014 Document Reviewed: 11/11/2013 Elsevier Interactive Patient Education 2016 [employer]  

## 2015-11-02 NOTE — ED Notes (Signed)
59 y.o./male present with cough x2 days thinks he may have possible bronchitis or pneumonia Has been sluggish and fatigue, patient has been taking ibuprofen and it was last taken about 6pm tonight 11/02/2015. No acute distress

## 2015-11-03 ENCOUNTER — Other Ambulatory Visit: Payer: Self-pay | Admitting: Orthopedic Surgery

## 2015-11-03 NOTE — ED Provider Notes (Signed)
CSN: [id number]     Arrival date & time 11/02/15  1957 History   First MD Initiated Contact with Patient 11/02/15 2053     Chief Complaint  Patient presents with  . Cough   (Consider location/radiation/quality/duration/timing/severity/associated sxs/prior Treatment) HPI History obtained from patient:   LOCATION:chest upper resp SEVERITY: 2 DURATION:3 weeks CONTEXT:had flu symptoms, but now with cough, wheezing QUALITY:history of chronic bronchitis MODIFYING FACTORS:using inhalers  ASSOCIATED SYMPTOMS:cough, sputum production TIMING:constant OCCUPATION:  Past Medical History  Diagnosis Date  . Hypertension   . Pancreatitis, acute   . Chronic bronchitis ([place])   . Gout    Past Surgical History  Procedure Laterality Date  . Tonsillectomy    . Amputation Left 07/14/2014    Procedure: 2nd Toe Amputation;  Surgeon: [name], MD;  Location: [facility];  Service: Orthopedics;  Laterality: Left;   Family History  Problem Relation Age of Onset  . Adopted: Yes   Social History  Substance Use Topics  . Smoking status: Never Smoker   . Smokeless tobacco: None  . Alcohol Use: No    Review of Systems Cough, wheeze Allergies  Shellfish allergy  Home Medications   Prior to Admission medications   Medication Sig Start Date End Date Taking? Authorizing Provider  albuterol (PROVENTIL HFA;VENTOLIN HFA) 108 (90 BASE) MCG/ACT inhaler Inhale 2 puffs into the lungs every 6 (six) hours as needed for wheezing or shortness of breath.   Yes Historical Provider, MD  albuterol (PROVENTIL HFA;VENTOLIN HFA) 108 (90 BASE) MCG/ACT inhaler Inhale 2 puffs into the lungs every 4 (four) hours as needed for wheezing or shortness of breath. Dispense with aerochamber 08/13/15  Yes Melynda Ripple, MD  allopurinol (ZYLOPRIM) 100 MG tablet Take 100 mg by mouth daily.  07/19/14  Yes Historical Provider, MD  Digestive Enzymes (DIGESTIVE ENZYME PO) Take 1 capsule by mouth 3 (three) times daily with meals.  [place] brand   Yes Historical Provider, MD  fluticasone (FLONASE) 50 MCG/ACT nasal spray Place 1 spray into both nostrils daily. 12/29/14  Yes Historical Provider, MD  Fluticasone-Salmeterol (ADVAIR) 500-50 MCG/DOSE AEPB Inhale 1 puff into the lungs 2 (two) times daily. 10/21/14  Yes Tresa Garter, MD  hydrochlorothiazide (HYDRODIURIL) 25 MG tablet Take 1 tablet (25 mg total) by mouth daily. 08/12/15  Yes Lance Bosch, NP  ibuprofen (ADVIL,MOTRIN) 800 MG tablet Take 800 mg by mouth every 8 (eight) hours as needed.   Yes Historical Provider, MD  Multiple Vitamin (MULTIVITAMIN WITH MINERALS) TABS tablet Take 1 tablet by mouth daily.   Yes Historical Provider, MD  Pancrelipase, Lip-Prot-Amyl, (CREON) 24000 UNITS CPEP Take 1 capsule (24,000 Units total) by mouth daily. 08/10/14  Yes Tresa Garter, MD  pantoprazole (PROTONIX) 40 MG tablet Take 1 tablet (40 mg total) by mouth daily. Needs office visit for more refills 10/11/15  Yes Olugbemiga E Doreene Burke, MD  azithromycin (ZITHROMAX Z-PAK) 250 MG tablet Take 1 tablet (250 mg total) by mouth daily. 11/02/15   Konrad Felix, PA  Bioflavonoid Products (ESTER C PO) Take 1 tablet by mouth daily.    Historical Provider, MD  clobetasol ointment (TEMOVATE) [id number] % Apply 1 application topically 2 (two) times daily. 11/05/14   Tresa Garter, MD  colchicine (COLCRYS) 0.6 MG tablet Take 0.6 mg by mouth daily.    Historical Provider, MD  ferrous sulfate 325 (65 FE) MG tablet Take 1 tablet (325 mg total) by mouth 2 (two) times daily with a meal. Patient not taking: Reported  on 07/05/2015 02/03/15   Arnoldo Morale, MD  OVER THE COUNTER MEDICATION Take 1 capsule by mouth 3 (three) times daily with meals. Blue/green algae    Historical Provider, MD  potassium chloride SA (K-DUR,KLOR-CON) 20 MEQ tablet Take 1 tablet (20 mEq total) by mouth daily. 08/01/15   Tresa Garter, MD  predniSONE (DELTASONE) 50 MG tablet Take 1 tablet (50 mg total) by mouth daily with  breakfast. 08/13/15   Melynda Ripple, MD  predniSONE (DELTASONE) 50 MG tablet Take 1 tablet (50 mg total) by mouth daily. 11/02/15   Konrad Felix, PA   Meds Ordered and Administered this Visit  Medications - No data to display  BP 126/93 mmHg  Pulse 106  Temp(Src) 99 F (37.2 C) (Oral)  SpO2 98% No data found.   Physical Exam NURSES NOTES AND VITAL SIGNS REVIEWED. CONSTITUTIONAL: Well developed, well nourished, no acute distress HEENT: normocephalic, atraumatic, right and left TM's are normal EYES: Conjunctiva normal NECK:normal ROM, supple, no adenopathy PULMONARY:No respiratory distress, normal effort, Lungs: Diffuse wheezes throughout chest  CARDIOVASCULAR: RRR, no murmur ABDOMEN: soft, ND, NT, +'ve BS MUSCULOSKELETAL: Normal ROM of all extremities,  SKIN: warm and dry without rash PSYCHIATRIC: Mood and affect, behavior are normal  ED Course  Procedures (including critical care time)  Labs Review Labs Reviewed - No data to display  Imaging Review No results found.   Visual Acuity Review  Right Eye Distance:   Left Eye Distance:   Bilateral Distance:    Right Eye Near:   Left Eye Near:    Bilateral Near:        RX zpak, prednisone. Has inhalers at home MDM   1. Bronchitis     Patient is reassured that there are no issues that require transfer to higher level of care at this time or additional tests. Patient is advised to continue home symptomatic treatment. Patient is advised that if there are new or worsening symptoms to attend the emergency department, contact primary care provider, or return to UC. Instructions of care provided discharged home in stable condition. Return to work/school note provided.   THIS NOTE WAS GENERATED USING A VOICE RECOGNITION SOFTWARE PROGRAM. ALL REASONABLE EFFORTS  WERE MADE TO PROOFREAD THIS DOCUMENT FOR ACCURACY.  I have verbally reviewed the discharge instructions with the patient. A printed AVS was given to the  patient.  All questions were answered prior to discharge.      Konrad Felix, [place] 11/03/15 [phone]

## 2015-11-11 ENCOUNTER — Encounter (HOSPITAL_COMMUNITY): Payer: Self-pay

## 2015-11-11 ENCOUNTER — Encounter (HOSPITAL_COMMUNITY)
Admission: RE | Admit: 2015-11-11 | Discharge: 2015-11-11 | Disposition: A | Discharge status: Home / Self Care | Payer: BLUE CROSS/BLUE SHIELD | Source: Ambulatory Visit | Attending: Orthopedic Surgery | Admitting: Orthopedic Surgery

## 2015-11-11 DIAGNOSIS — M868X6 Other osteomyelitis, lower leg: Secondary | ICD-10-CM | POA: Diagnosis not present

## 2015-11-11 DIAGNOSIS — Z01812 Encounter for preprocedural laboratory examination: Secondary | ICD-10-CM | POA: Insufficient documentation

## 2015-11-11 HISTORY — DX: Gastro-esophageal reflux disease without esophagitis: K21.9

## 2015-11-11 HISTORY — DX: Unspecified osteoarthritis, unspecified site: M19.90

## 2015-11-11 HISTORY — DX: Pneumonia, unspecified organism: J18.9

## 2015-11-11 LAB — COMPREHENSIVE METABOLIC PANEL
ALK PHOS: 68 U/L (ref 38–126)
ALT: 34 U/L (ref 17–63)
ANION GAP: 10 (ref 5–15)
AST: 34 U/L (ref 15–41)
Albumin: 2.9 g/dL — ABNORMAL LOW (ref 3.5–5.0)
BUN: 11 mg/dL (ref 6–20)
CALCIUM: 8.9 mg/dL (ref 8.9–10.3)
CO2: 29 mmol/L (ref 22–32)
CREATININE: 0.77 mg/dL (ref 0.61–1.24)
Chloride: 102 mmol/L (ref 101–111)
Glucose, Bld: 94 mg/dL (ref 65–99)
Potassium: 3 mmol/L — ABNORMAL LOW (ref 3.5–5.1)
SODIUM: 141 mmol/L (ref 135–145)
TOTAL PROTEIN: 7.2 g/dL (ref 6.5–8.1)
Total Bilirubin: 0.4 mg/dL (ref 0.3–1.2)

## 2015-11-11 LAB — CBC
HCT: 35.1 % — ABNORMAL LOW (ref 39.0–52.0)
HEMOGLOBIN: 11.1 g/dL — AB (ref 13.0–17.0)
MCH: 26.8 pg (ref 26.0–34.0)
MCHC: 31.6 g/dL (ref 30.0–36.0)
MCV: 84.8 fL (ref 78.0–100.0)
Platelets: 331 10*3/uL (ref 150–400)
RBC: 4.14 MIL/uL — ABNORMAL LOW (ref 4.22–5.81)
RDW: 13.6 % (ref 11.5–15.5)
WBC: 4.1 10*3/uL (ref 4.0–10.5)

## 2015-11-11 LAB — PROTIME-INR
INR: 1.23 (ref 0.00–1.49)
PROTHROMBIN TIME: 15.6 s — AB (ref 11.6–15.2)

## 2015-11-11 LAB — APTT: aPTT: 28 seconds (ref 24–37)

## 2015-11-11 NOTE — Pre-Procedure Instructions (Signed)
    Stephen Whitaker  11/11/2015     Your procedure is scheduled on Wednesday, March 29.  Report to [facility] Admitting at 8:45   A.M.                 Your surgery or procedure is scheduled for 10:45 AM   Call this number if you have problems the morning of surgery: [phone]     Remember:  Do not eat food or drink liquids after midnight Tuesday, March 28.  Take these medicines the morning of surgery with A SIP OF WATER : allopurinol (ZYLOPRIM), pantoprazole (PROTONIX).              May use inhalers, Nasal Sprays and eye drops.             Today, Stop taking Aspirin, Coumadin, Plavix, Effient and Herbal medications.  Do not take any NSAIDs ie: Ibuprofen,  Advil,Naproxen or any medication containing Aspirin.     Do not wear jewelry, make-up or nail polish.  Do not wear lotions, powders, or perfumes.   Men may shave face and neck.  Do not bring valuables to the hospital.  [facility] is not responsible for any belongings or valuables.  Contacts, dentures or bridgework may not be worn into surgery.  Leave your suitcase in the car.  After surgery it may be brought to your room.  For patients admitted to the hospital, discharge time will be determined by your treatment team.  Patients discharged the day of surgery will not be allowed to drive home.   Name and phone number of your driver:   - Special instructions:  Review   - Preparing For Surgery.  Please read over the following fact sheets that you were given. Pain Booklet, Coughing and Deep Breathing and Surgical Site Infection Prevention

## 2015-11-11 NOTE — Progress Notes (Signed)
Stephen Whitaker denies chest pain or shortness of breath.  Patient experienced synope episode in 10/2014,he had an echo and carotid stents done- all were "ok".  Patients syncope at this time.

## 2015-11-14 ENCOUNTER — Other Ambulatory Visit: Payer: Self-pay | Admitting: Internal Medicine

## 2015-11-15 MED ORDER — CEFAZOLIN SODIUM-DEXTROSE 2-4 GM/100ML-% IV SOLN
2.0000 g | INTRAVENOUS | Status: DC
  Filled 2015-11-15: qty 100

## 2015-11-15 NOTE — Anesthesia Preprocedure Evaluation (Addendum)
Anesthesia Evaluation  Patient identified by MRN, date of birth, ID band Patient awake    Reviewed: Allergy & Precautions, NPO status , Patient's Chart, lab work & pertinent test results  History of Anesthesia Complications Negative for: history of anesthetic complications  Airway Mallampati: II  TM Distance: >3 FB Neck ROM: Full    Dental no notable dental hx. (+) Dental Advisory Given   Pulmonary asthma , COPD,    Pulmonary exam normal breath sounds clear to auscultation       Cardiovascular hypertension, Pt. on medications Normal cardiovascular exam Rhythm:Regular Rate:Normal     Neuro/Psych negative neurological ROS  negative psych ROS   GI/Hepatic Neg liver ROS, GERD  Medicated and Controlled,  Endo/Other  negative endocrine ROS  Renal/GU negative Renal ROS  negative genitourinary   Musculoskeletal  (+) Arthritis , Osteoarthritis,    Abdominal   Peds negative pediatric ROS (+)  Hematology negative hematology ROS (+)   Anesthesia Other Findings   Reproductive/Obstetrics negative OB ROS                            Anesthesia Physical Anesthesia Plan  ASA: III  Anesthesia Plan: General   Post-op Pain Management: GA combined w/ Regional for post-op pain   Induction: Intravenous  Airway Management Planned: LMA  Additional Equipment:   Intra-op Plan:   Post-operative Plan: Extubation in OR  Informed Consent: I have reviewed the patients History and Physical, chart, labs and discussed the procedure including the risks, benefits and alternatives for the proposed anesthesia with the patient or authorized representative who has indicated his/her understanding and acceptance.   Dental advisory given  Plan Discussed with: CRNA  Anesthesia Plan Comments:         Anesthesia Quick Evaluation

## 2015-11-16 ENCOUNTER — Ambulatory Visit (HOSPITAL_COMMUNITY): Payer: BLUE CROSS/BLUE SHIELD | Admitting: Anesthesiology

## 2015-11-16 ENCOUNTER — Encounter (HOSPITAL_COMMUNITY)
Admission: RE | Disposition: A | Discharge status: Home / Self Care | Payer: Self-pay | Source: Ambulatory Visit | Attending: Orthopedic Surgery

## 2015-11-16 ENCOUNTER — Encounter (HOSPITAL_COMMUNITY): Payer: Self-pay | Admitting: *Deleted

## 2015-11-16 ENCOUNTER — Ambulatory Visit (HOSPITAL_COMMUNITY)
Admission: RE | Admit: 2015-11-16 | Discharge: 2015-11-17 | Disposition: A | Discharge status: Home / Self Care | Payer: BLUE CROSS/BLUE SHIELD | Source: Ambulatory Visit | Attending: Orthopedic Surgery | Admitting: Orthopedic Surgery

## 2015-11-16 DIAGNOSIS — Z89422 Acquired absence of other left toe(s): Secondary | ICD-10-CM | POA: Diagnosis not present

## 2015-11-16 DIAGNOSIS — K219 Gastro-esophageal reflux disease without esophagitis: Secondary | ICD-10-CM | POA: Diagnosis not present

## 2015-11-16 DIAGNOSIS — M869 Osteomyelitis, unspecified: Secondary | ICD-10-CM

## 2015-11-16 DIAGNOSIS — I10 Essential (primary) hypertension: Secondary | ICD-10-CM | POA: Diagnosis not present

## 2015-11-16 DIAGNOSIS — L97529 Non-pressure chronic ulcer of other part of left foot with unspecified severity: Secondary | ICD-10-CM | POA: Diagnosis not present

## 2015-11-16 DIAGNOSIS — M868X6 Other osteomyelitis, lower leg: Secondary | ICD-10-CM | POA: Diagnosis present

## 2015-11-16 DIAGNOSIS — M868X7 Other osteomyelitis, ankle and foot: Secondary | ICD-10-CM | POA: Insufficient documentation

## 2015-11-16 DIAGNOSIS — M1990 Unspecified osteoarthritis, unspecified site: Secondary | ICD-10-CM | POA: Insufficient documentation

## 2015-11-16 HISTORY — PX: TOE AMPUTATION: SHX809

## 2015-11-16 HISTORY — PX: AMPUTATION: SHX166

## 2015-11-16 SURGERY — AMPUTATION DIGIT
Anesthesia: General | Laterality: Left

## 2015-11-16 MED ORDER — ONDANSETRON HCL 4 MG/2ML IJ SOLN
4.0000 mg | Freq: Four times a day (QID) | INTRAMUSCULAR | Status: DC | PRN
  Administered 2015-11-17: 4 mg via INTRAVENOUS
  Filled 2015-11-16: qty 2

## 2015-11-16 MED ORDER — LIDOCAINE HCL (CARDIAC) 20 MG/ML IV SOLN
INTRAVENOUS | Status: DC | PRN
  Administered 2015-11-16: 80 mg via INTRAVENOUS

## 2015-11-16 MED ORDER — METHOCARBAMOL 500 MG PO TABS
500.0000 mg | ORAL_TABLET | Freq: Four times a day (QID) | ORAL | Status: DC | PRN
  Administered 2015-11-16 – 2015-11-17 (×3): 500 mg via ORAL
  Filled 2015-11-16 (×3): qty 1

## 2015-11-16 MED ORDER — 0.9 % SODIUM CHLORIDE (POUR BTL) OPTIME
TOPICAL | Status: DC | PRN
  Administered 2015-11-16: 1000 mL

## 2015-11-16 MED ORDER — MOMETASONE FURO-FORMOTEROL FUM 200-5 MCG/ACT IN AERO
2.0000 | INHALATION_SPRAY | Freq: Two times a day (BID) | RESPIRATORY_TRACT | Status: DC
  Administered 2015-11-16 – 2015-11-17 (×2): 2 via RESPIRATORY_TRACT
  Filled 2015-11-16: qty 8.8

## 2015-11-16 MED ORDER — SODIUM CHLORIDE 0.9 % IV SOLN
INTRAVENOUS | Status: DC
  Administered 2015-11-16: 13:00:00 via INTRAVENOUS

## 2015-11-16 MED ORDER — FENTANYL CITRATE (PF) 100 MCG/2ML IJ SOLN
25.0000 ug | INTRAMUSCULAR | Status: DC | PRN
  Administered 2015-11-16 (×2): 50 ug via INTRAVENOUS

## 2015-11-16 MED ORDER — FENTANYL CITRATE (PF) 100 MCG/2ML IJ SOLN
INTRAMUSCULAR | Status: DC | PRN
  Administered 2015-11-16: 50 ug via INTRAVENOUS

## 2015-11-16 MED ORDER — ALBUTEROL SULFATE HFA 108 (90 BASE) MCG/ACT IN AERS: 2.0000 | INHALATION_SPRAY | RESPIRATORY_TRACT | Status: DC | PRN

## 2015-11-16 MED ORDER — PANCRELIPASE (LIP-PROT-AMYL) 12000-38000 UNITS PO CPEP
24000.0000 [IU] | ORAL_CAPSULE | Freq: Every day | ORAL | Status: DC
  Administered 2015-11-16 – 2015-11-17 (×2): 24000 [IU] via ORAL
  Filled 2015-11-16 (×3): qty 2

## 2015-11-16 MED ORDER — ONDANSETRON HCL 4 MG/2ML IJ SOLN: 4.0000 mg | Freq: Once | INTRAMUSCULAR | Status: DC | PRN

## 2015-11-16 MED ORDER — ASPIRIN EC 325 MG PO TBEC
325.0000 mg | DELAYED_RELEASE_TABLET | Freq: Every day | ORAL | Status: DC
  Administered 2015-11-16 – 2015-11-17 (×2): 325 mg via ORAL
  Filled 2015-11-16 (×2): qty 1

## 2015-11-16 MED ORDER — HYDROMORPHONE HCL 1 MG/ML IJ SOLN
1.0000 mg | INTRAMUSCULAR | Status: DC | PRN
  Administered 2015-11-16: 1 mg via INTRAVENOUS
  Filled 2015-11-16: qty 1

## 2015-11-16 MED ORDER — FENTANYL CITRATE (PF) 100 MCG/2ML IJ SOLN
INTRAMUSCULAR | Status: AC
  Filled 2015-11-16: qty 2

## 2015-11-16 MED ORDER — CHLORHEXIDINE GLUCONATE 4 % EX LIQD
60.0000 mL | Freq: Once | CUTANEOUS | Status: DC
Start: 2015-11-16 — End: 2015-11-16

## 2015-11-16 MED ORDER — PROPOFOL 10 MG/ML IV BOLUS
INTRAVENOUS | Status: AC
  Filled 2015-11-16: qty 20

## 2015-11-16 MED ORDER — ALBUTEROL SULFATE (2.5 MG/3ML) 0.083% IN NEBU: 2.5000 mg | INHALATION_SOLUTION | RESPIRATORY_TRACT | Status: DC | PRN

## 2015-11-16 MED ORDER — METHOCARBAMOL 1000 MG/10ML IJ SOLN
500.0000 mg | Freq: Four times a day (QID) | INTRAVENOUS | Status: DC | PRN
  Filled 2015-11-16: qty 5

## 2015-11-16 MED ORDER — ACETAMINOPHEN 325 MG PO TABS: 650.0000 mg | ORAL_TABLET | Freq: Four times a day (QID) | ORAL | Status: DC | PRN

## 2015-11-16 MED ORDER — LIDOCAINE HCL (CARDIAC) 20 MG/ML IV SOLN
INTRAVENOUS | Status: AC
  Filled 2015-11-16: qty 5

## 2015-11-16 MED ORDER — OXYCODONE HCL 5 MG PO TABS
5.0000 mg | ORAL_TABLET | ORAL | Status: DC | PRN
Start: 2015-11-16 — End: 2015-11-17
  Administered 2015-11-16 – 2015-11-17 (×4): 10 mg via ORAL
  Filled 2015-11-16 (×4): qty 2

## 2015-11-16 MED ORDER — MIDAZOLAM HCL 2 MG/2ML IJ SOLN
INTRAMUSCULAR | Status: AC
  Filled 2015-11-16: qty 2

## 2015-11-16 MED ORDER — ONDANSETRON HCL 4 MG PO TABS: 4.0000 mg | ORAL_TABLET | Freq: Four times a day (QID) | ORAL | Status: DC | PRN

## 2015-11-16 MED ORDER — CEFAZOLIN SODIUM 1-5 GM-% IV SOLN
1.0000 g | Freq: Four times a day (QID) | INTRAVENOUS | Status: AC
  Administered 2015-11-16 – 2015-11-17 (×3): 1 g via INTRAVENOUS
  Filled 2015-11-16 (×3): qty 50

## 2015-11-16 MED ORDER — FENTANYL CITRATE (PF) 250 MCG/5ML IJ SOLN
INTRAMUSCULAR | Status: AC
  Filled 2015-11-16: qty 5

## 2015-11-16 MED ORDER — ALLOPURINOL 300 MG PO TABS
300.0000 mg | ORAL_TABLET | Freq: Every day | ORAL | Status: DC
  Administered 2015-11-16 – 2015-11-17 (×2): 300 mg via ORAL
  Filled 2015-11-16 (×2): qty 1

## 2015-11-16 MED ORDER — FLUTICASONE PROPIONATE 50 MCG/ACT NA SUSP
1.0000 | Freq: Every day | NASAL | Status: DC
  Administered 2015-11-16 – 2015-11-17 (×2): 1 via NASAL
  Filled 2015-11-16: qty 16

## 2015-11-16 MED ORDER — PANTOPRAZOLE SODIUM 40 MG PO TBEC
40.0000 mg | DELAYED_RELEASE_TABLET | Freq: Every day | ORAL | Status: DC
  Administered 2015-11-16 – 2015-11-17 (×2): 40 mg via ORAL
  Filled 2015-11-16 (×2): qty 1

## 2015-11-16 MED ORDER — METOCLOPRAMIDE HCL 5 MG PO TABS: 5.0000 mg | ORAL_TABLET | Freq: Three times a day (TID) | ORAL | Status: DC | PRN

## 2015-11-16 MED ORDER — HYDROCHLOROTHIAZIDE 25 MG PO TABS
25.0000 mg | ORAL_TABLET | Freq: Every day | ORAL | Status: DC
  Administered 2015-11-16 – 2015-11-17 (×2): 25 mg via ORAL
  Filled 2015-11-16 (×2): qty 1

## 2015-11-16 MED ORDER — LACTATED RINGERS IV SOLN
INTRAVENOUS | Status: DC | PRN
  Administered 2015-11-16: 08:00:00 via INTRAVENOUS

## 2015-11-16 MED ORDER — BUPIVACAINE-EPINEPHRINE (PF) 0.5% -1:200000 IJ SOLN
INTRAMUSCULAR | Status: DC | PRN
  Administered 2015-11-16: 30 mL

## 2015-11-16 MED ORDER — METOCLOPRAMIDE HCL 5 MG/ML IJ SOLN: 5.0000 mg | Freq: Three times a day (TID) | INTRAMUSCULAR | Status: DC | PRN

## 2015-11-16 MED ORDER — ACETAMINOPHEN 650 MG RE SUPP: 650.0000 mg | Freq: Four times a day (QID) | RECTAL | Status: DC | PRN

## 2015-11-16 MED ORDER — MIDAZOLAM HCL 5 MG/5ML IJ SOLN
INTRAMUSCULAR | Status: DC | PRN
  Administered 2015-11-16: 2 mg via INTRAVENOUS

## 2015-11-16 MED ORDER — PROPOFOL 10 MG/ML IV BOLUS
INTRAVENOUS | Status: DC | PRN
  Administered 2015-11-16: 150 mg via INTRAVENOUS

## 2015-11-16 SURGICAL SUPPLY — 32 items
BLADE SURG 21 STRL SS (BLADE) ×2 IMPLANT
BNDG COHESIVE 3X5 TAN STRL LF (GAUZE/BANDAGES/DRESSINGS) ×2 IMPLANT
BNDG COHESIVE 4X5 TAN STRL (GAUZE/BANDAGES/DRESSINGS) ×2 IMPLANT
BNDG ESMARK 4X9 LF (GAUZE/BANDAGES/DRESSINGS) IMPLANT
BNDG GAUZE ELAST 4 BULKY (GAUZE/BANDAGES/DRESSINGS) ×2 IMPLANT
COVER SURGICAL LIGHT HANDLE (MISCELLANEOUS) ×4 IMPLANT
DRAPE U-SHAPE 47X51 STRL (DRAPES) ×2 IMPLANT
DRSG ADAPTIC 3X8 NADH LF (GAUZE/BANDAGES/DRESSINGS) ×2 IMPLANT
DRSG PAD ABDOMINAL 8X10 ST (GAUZE/BANDAGES/DRESSINGS) ×2 IMPLANT
DURAPREP 26ML APPLICATOR (WOUND CARE) ×2 IMPLANT
ELECT REM PT RETURN 9FT ADLT (ELECTROSURGICAL) ×2
ELECTRODE REM PT RTRN 9FT ADLT (ELECTROSURGICAL) ×1 IMPLANT
GAUZE SPONGE 4X4 12PLY STRL (GAUZE/BANDAGES/DRESSINGS) IMPLANT
GLOVE BIOGEL PI IND STRL 9 (GLOVE) ×1 IMPLANT
GLOVE BIOGEL PI INDICATOR 9 (GLOVE) ×1
GLOVE SURG ORTHO 9.0 STRL STRW (GLOVE) ×2 IMPLANT
GOWN STRL REUS W/ TWL XL LVL3 (GOWN DISPOSABLE) ×2 IMPLANT
GOWN STRL REUS W/TWL XL LVL3 (GOWN DISPOSABLE) ×2
KIT BASIN OR (CUSTOM PROCEDURE TRAY) ×2 IMPLANT
KIT ROOM TURNOVER OR (KITS) ×2 IMPLANT
MANIFOLD NEPTUNE II (INSTRUMENTS) ×2 IMPLANT
NEEDLE 22X1 1/2 (OR ONLY) (NEEDLE) IMPLANT
NS IRRIG 1000ML POUR BTL (IV SOLUTION) ×2 IMPLANT
PACK ORTHO EXTREMITY (CUSTOM PROCEDURE TRAY) ×2 IMPLANT
PAD ARMBOARD 7.5X6 YLW CONV (MISCELLANEOUS) ×4 IMPLANT
SPONGE GAUZE 4X4 12PLY STER LF (GAUZE/BANDAGES/DRESSINGS) ×2 IMPLANT
SUCTION FRAZIER HANDLE 10FR (MISCELLANEOUS)
SUCTION TUBE FRAZIER 10FR DISP (MISCELLANEOUS) IMPLANT
SUT ETHILON 2 0 PSLX (SUTURE) ×2 IMPLANT
SYR CONTROL 10ML LL (SYRINGE) IMPLANT
TOWEL OR 17X24 6PK STRL BLUE (TOWEL DISPOSABLE) ×2 IMPLANT
TOWEL OR 17X26 10 PK STRL BLUE (TOWEL DISPOSABLE) ×2 IMPLANT

## 2015-11-16 NOTE — Anesthesia Postprocedure Evaluation (Signed)
Anesthesia Post Note  Patient: Stephen Whitaker  Procedure(s) Performed: Procedure(s) (LRB): Left Great Toe Amputation at Metatarsophalangeal Joint (Left)  Patient location during evaluation: PACU Anesthesia Type: General Level of consciousness: awake and alert Pain management: pain level controlled Vital Signs Assessment: post-procedure vital signs reviewed and stable Respiratory status: spontaneous breathing, nonlabored ventilation, respiratory function stable and patient connected to nasal cannula oxygen Cardiovascular status: blood pressure returned to baseline and stable Postop Assessment: no signs of nausea or vomiting Anesthetic complications: no    Last Vitals:  Filed Vitals:   11/16/15 1214 11/16/15 1230  BP:  123/82  Pulse:    Temp: 37.2 C 36.6 C  Resp:  16    Last Pain:  Filed Vitals:   11/16/15 1233  PainSc: 5                  Freddye Cardamone JENNETTE

## 2015-11-16 NOTE — Transfer of Care (Signed)
Immediate Anesthesia Transfer of Care Note  Patient: Stephen Whitaker  Procedure(s) Performed: Procedure(s): Left Great Toe Amputation at Metatarsophalangeal Joint (Left)  Patient Location: PACU  Anesthesia Type:General  Level of Consciousness: awake  Airway & Oxygen Therapy: Patient Spontanous Breathing and Patient connected to nasal cannula oxygen  Post-op Assessment: Report given to RN and Post -op Vital signs reviewed and stable  Post vital signs: Reviewed and stable  Last Vitals:  Filed Vitals:   11/16/15 0738 11/16/15 0845  BP: 167/95   Pulse: 94   Temp: 37 C 36.1 C  Resp: 20     Complications: No apparent anesthesia complications

## 2015-11-16 NOTE — Op Note (Signed)
11/16/2015  8:48 AM  PATIENT:  Stephen Whitaker    PRE-OPERATIVE DIAGNOSIS:  Osteomyelitis Left Great Toe  POST-OPERATIVE DIAGNOSIS:  Same  PROCEDURE:  Left Great Toe Amputation at Metatarsophalangeal Joint  SURGEON:  [name], MD  PHYSICIAN ASSISTANT:None ANESTHESIA:   General  PREOPERATIVE INDICATIONS:  Stephen Whitaker is a  60 y.o. male with a diagnosis of Osteomyelitis Left Great Toe who failed conservative measures and elected for surgical management.    The risks benefits and alternatives were discussed with the patient preoperatively including but not limited to the risks of infection, bleeding, nerve injury, cardiopulmonary complications, the need for revision surgery, among others, and the patient was willing to proceed.  OPERATIVE IMPLANTS: None  OPERATIVE FINDINGS: Necrotic bone no abscess  OPERATIVE PROCEDURE: Patient was brought to the operating room and underwent a general anesthetic. After adequate levels anesthesia obtained patient's left lower extremity was prepped using DuraPrep draped into a sterile field. A timeout was called. A fishmouth incision was made just distal to the MTP joint. The toe was amputated through the MTP joint. There is significant necrosis of the proximal phalanx of the great toe and the bone collapsed. There is no deep abscess. Electrocautery was used for hemostasis the wound was irrigated with normal saline. The incision was closed using 2-0 nylon. A sterile compressive dressing was applied. Patient was extubated taken to the PACU in stable condition.  Plan for overnight observation or pain control.

## 2015-11-16 NOTE — H&P (Signed)
Stephen Whitaker is an 60 y.o. male.   Chief Complaint: Osteomyelitis ulceration left great toe HPI: Patient is a 60 year old gentleman who is 2 years status post amputation of the left second toe presents at this time with osteomyelitis ulceration left great toe.  Past Medical History  Diagnosis Date  . Hypertension   . Pancreatitis, acute   . Chronic bronchitis ([place])   . Gout   . Pneumonia 2016  . GERD (gastroesophageal reflux disease)   . Arthritis   . Herpes genitalia     Past Surgical History  Procedure Laterality Date  . Tonsillectomy    . Amputation Left 07/14/2014    Procedure: 2nd Toe Amputation;  Surgeon: [name], MD;  Location: [facility];  Service: Orthopedics;  Laterality: Left;    Family History  Problem Relation Age of Onset  . Adopted: Yes   Social History:  reports that he has never smoked. He does not have any smokeless tobacco history on file. He reports that he does not drink alcohol or use illicit drugs.  Allergies:  Allergies  Allergen Reactions  . Shellfish Allergy Swelling  . Ciprofloxacin Rash and Other (See Comments)    Syncope epsiode    Medications Prior to Admission  Medication Sig Dispense Refill  . albuterol (PROVENTIL HFA;VENTOLIN HFA) 108 (90 BASE) MCG/ACT inhaler Inhale 2 puffs into the lungs every 4 (four) hours as needed for wheezing or shortness of breath. Dispense with aerochamber 1 Inhaler 0  . allopurinol (ZYLOPRIM) 100 MG tablet Take 300 mg by mouth daily.     Marland Kitchen Bioflavonoid Products (ESTER C PO) Take 1 tablet by mouth daily.    . clobetasol ointment (TEMOVATE) [id number] % Apply 1 application topically 2 (two) times daily. (Patient taking differently: Apply 1 application topically 2 (two) times daily as needed (for skin irritation). ) 30 g 0  . fluticasone (FLONASE) 50 MCG/ACT nasal spray Place 1 spray into both nostrils daily.    . Fluticasone-Salmeterol (ADVAIR) 500-50 MCG/DOSE AEPB Inhale 1 puff into the lungs 2 (two) times daily.  60 each 3  . hydrochlorothiazide (HYDRODIURIL) 25 MG tablet Take 1 tablet (25 mg total) by mouth daily. 30 tablet 0  . ibuprofen (ADVIL,MOTRIN) 800 MG tablet Take 800 mg by mouth every 8 (eight) hours as needed for moderate pain.     . Multiple Vitamin (MULTIVITAMIN WITH MINERALS) TABS tablet Take 1 tablet by mouth daily.    Mable Fill (EYE ALLERGY RELIEF OP) Place 1 drop into both eyes daily as needed (for allergy eyes).    . Neomy-Bacit-Polymyx-Pramoxine (TRIPLE ANTIBIOTIC+PAIN RELIEF EX) Apply 1 application topically daily as needed (apply to affected toe).    Marland Kitchen OVER THE COUNTER MEDICATION Take 1 capsule by mouth 3 (three) times daily with meals. Blue/green algae    . Pancrelipase, Lip-Prot-Amyl, (CREON) 24000 UNITS CPEP Take 1 capsule (24,000 Units total) by mouth daily. 180 capsule 3  . pantoprazole (PROTONIX) 40 MG tablet TAKE ONE TABLET BY MOUTH ONCE DAILY 30 tablet 2    No results found for this or any previous visit (from the past 48 hour(s)). No results found.  Review of Systems  All other systems reviewed and are negative.   There were no vitals taken for this visit. Physical Exam  On examination patient has ostium myelitis ulceration left great toe Assessment/Plan Assessment: Osteomyelitis ulceration left great toe.  Plan: We'll plan for amputation of the left great toe risk and benefits were discussed including the risk of the  wound not healing. Patient states he understands wishes to proceed at this time.  [name], MD 11/16/2015, 6:45 AM

## 2015-11-16 NOTE — Anesthesia Procedure Notes (Addendum)
Anesthesia Regional Block:  Popliteal block  Pre-Anesthetic Checklist: ,, timeout performed, Correct Patient, Correct Site, Correct Laterality, Correct Procedure, Correct Position, site marked, Risks and benefits discussed,  Surgical consent,  Pre-op evaluation,  At surgeon's request and post-op pain management  Laterality: Left  Prep: Maximum Sterile Barrier Precautions used and Dura Prep       Needles:  Injection technique: Single-shot  Needle Type: Echogenic Stimulator Needle     Needle Length: 10cm 10 cm Needle Gauge: 21 and 21 G    Additional Needles:  Procedures: ultrasound guided (picture in chart) and nerve stimulator Popliteal block Narrative:  Injection made incrementally with aspirations every 5 mL.  Performed by: Personally  Anesthesiologist: Lauretta Grill  Additional Notes: Patient tolerated the procedure well without complications   Procedure Name: LMA Insertion Date/Time: 11/16/2015 8:32 AM Performed by: Manus Gunning, Sebron Mcmahill J Pre-anesthesia Checklist: Timeout performed, Patient identified, Emergency Drugs available, Suction available and Patient being monitored Patient Re-evaluated:Patient Re-evaluated prior to inductionOxygen Delivery Method: Circle system utilized Preoxygenation: Pre-oxygenation with 100% oxygen Intubation Type: IV induction Ventilation: Mask ventilation without difficulty LMA: LMA inserted LMA Size: 5.0 Number of attempts: 1 Placement Confirmation: positive ETCO2 and breath sounds checked- equal and bilateral Tube secured with: Tape Dental Injury: Teeth and Oropharynx as per pre-operative assessment

## 2015-11-16 NOTE — Progress Notes (Signed)
Orthopedic Tech Progress Note Patient Details:  Stephen Whitaker 1956-06-13 [id number]  Ortho Devices Type of Ortho Device: Postop shoe/boot Ortho Device/Splint Location: lle Ortho Device/Splint Interventions: Application   [name] 11/16/2015, 2:38 PM

## 2015-11-17 ENCOUNTER — Encounter (HOSPITAL_COMMUNITY): Payer: Self-pay | Admitting: General Practice

## 2015-11-17 DIAGNOSIS — M868X7 Other osteomyelitis, ankle and foot: Secondary | ICD-10-CM | POA: Diagnosis not present

## 2015-11-17 MED ORDER — OXYCODONE-ACETAMINOPHEN 5-325 MG PO TABS: 1.0000 | ORAL_TABLET | ORAL | Status: DC | PRN

## 2015-11-17 NOTE — Progress Notes (Signed)
Physical Therapy Treatment Patient Details Name: Stephen Whitaker MRN: [id number] DOB: 08/22/1955 Today's Date: 11/17/2015    History of Present Illness Osteomyelitis of toe of left foot now s/p Left Great Toe Amputation at [place] Toe Amputation at [place] [facility]. PMH: Lt toe amputation 2015, hypertension.     PT Comments    Pt seen for second PT session with focus on stairs and ambulation. By the end of the session the pt was able to ambulate up/down stairs with min guard assistance and ambulate 100 ft with crutches. Pt did have 1 loss of balance but had independent recovery. Pt expressing that he feels like a walker would be more stable around his apartment. Recommended that the pt also have assistance with going up/down stairs for additional stability. Patient denies any questions or concerns.    Follow Up Recommendations  No PT follow up;Supervision for mobility/OOB     Equipment Recommendations  Rolling walker with 5" wheels    Recommendations for Other Services       Precautions / Restrictions Precautions Required Braces or Orthoses: Other Brace/Splint (post-op shoe) Restrictions Weight Bearing Restrictions: Yes LLE Weight Bearing: Touchdown weight bearing    Mobility  Bed Mobility Overal bed mobility: Independent             General bed mobility comments: sit-supine  Transfers Overall transfer level: Modified independent Equipment used: None             General transfer comment: transfers performed without device and with crutches. Reminder for TTWB only on LLE. No physical assistance needed.   Ambulation/Gait Ambulation/Gait assistance: Modified independent (Device/Increase time) Ambulation Distance (Feet): 100 Feet Assistive device: Crutches Gait Pattern/deviations: Step-to pattern Gait velocity: decreased   General Gait Details: reminder for TTWB during ambulation, 1 mild loss of balance with independent  recovery.    Stairs Stairs: Yes Stairs assistance: Min guard Stair Management: No rails;Forwards;With crutches Number of Stairs: 10 (5 steps X2 with seated rest between attempts. ) General stair comments: Education performed on technique prior to and during session. Handout also provided as a reminder. Encouraged pt to have assistance when going home for additional support.   Wheelchair Mobility    Modified Rankin (Stroke Patients Only)       Balance Overall balance assessment: Needs assistance Sitting-balance support: No upper extremity supported Sitting balance-Leahy Scale: Normal     Standing balance support: No upper extremity supported Standing balance-Leahy Scale: Good                      Cognition Arousal/Alertness: Awake/alert Behavior During Therapy: WFL for tasks assessed/performed Overall Cognitive Status: Within Functional Limits for tasks assessed                      Exercises      General Comments General comments (skin integrity, edema, etc.): Pt denies any questions or concerns following session.       Pertinent Vitals/Pain Pain Assessment: 0-10 Pain Score: 6  Pain Location: Lt foot Pain Descriptors / Indicators: Aching Pain Intervention(s): Monitored during session    Home Living Family/patient expects to be discharged to:: Private residence Living Arrangements: Other (Comment) (room at [facility])   Type of Home: Apartment Home Access: Stairs to enter Entrance Stairs-Rails: None Home Layout: One level Home Equipment: Crutches      Prior Function Level of Independence: Independent          PT Goals (current goals can  now be found in the care plan section) Acute Rehab PT Goals Patient Stated Goal: be able to move better and have less pain.  PT Goal Formulation: With patient Time For Goal Achievement: 12/01/15 Potential to Achieve Goals: Good Progress towards PT goals: Progressing toward goals    Frequency   Min 5X/week    PT Plan Current plan remains appropriate    Co-evaluation             End of Session Equipment Utilized During Treatment: Gait belt Activity Tolerance: Patient tolerated treatment well Patient left: in bed;with call bell/phone within reach;Other (comment) (LLE elevated)     Time: [id number] PT Time Calculation (min) (ACUTE ONLY): 32 min  Charges:  $Gait Training: 23-37 mins                    G Codes:      Cassell Clement, PT, CSCS Pager [phone] Office [phone]  11/17/2015, 1:12 PM

## 2015-11-17 NOTE — Discharge Summary (Signed)
Physician Discharge Summary  Patient ID: Stephen Whitaker MRN: [id number] DOB/AGE: 61-29-1957 61 y.o.  Admit date: 11/16/2015 Discharge date: 11/17/2015  Admission Diagnoses:Osteomyelitis left great toe  Discharge Diagnoses:  Active Problems:   Osteomyelitis of toe of left foot [facility])   Discharged Condition: stable  Hospital Course: Patient's hospital course was essentially unremarkable. He underwent a left great toe amputation. Postoperatively he progressed well the dressing was clean and dry and he was discharged to home in stable condition.  Consults: None  Significant Diagnostic Studies: labs: Routine labs  Treatments: surgery: See operative note  Discharge Exam: Blood pressure 136/80, pulse 87, temperature 98.3 F (36.8 C), temperature source Oral, resp. rate 16, weight 88.905 kg (196 lb), SpO2 100 %. Incision/Wound: Dressing clean and dry  Disposition: 01-Home or Self Care     Medication List    ASK your doctor about these medications        albuterol 108 (90 Base) MCG/ACT inhaler  Commonly known as:  PROVENTIL HFA;VENTOLIN HFA  Inhale 2 puffs into the lungs every 4 (four) hours as needed for wheezing or shortness of breath. Dispense with aerochamber     allopurinol 100 MG tablet  Commonly known as:  ZYLOPRIM  Take 300 mg by mouth daily.     clobetasol ointment 0.05 %  Commonly known as:  TEMOVATE  Apply 1 application topically 2 (two) times daily.     ESTER C PO  Take 1 tablet by mouth daily.     EYE ALLERGY RELIEF OP  Place 1 drop into both eyes daily as needed (for allergy eyes).     fluticasone 50 MCG/ACT nasal spray  Commonly known as:  FLONASE  Place 1 spray into both nostrils daily.     Fluticasone-Salmeterol 500-50 MCG/DOSE Aepb  Commonly known as:  ADVAIR  Inhale 1 puff into the lungs 2 (two) times daily.     hydrochlorothiazide 25 MG tablet  Commonly known as:  HYDRODIURIL  Take 1 tablet (25 mg total) by mouth daily.     ibuprofen 800  MG tablet  Commonly known as:  ADVIL,MOTRIN  Take 800 mg by mouth every 8 (eight) hours as needed for moderate pain.     multivitamin with minerals Tabs tablet  Take 1 tablet by mouth daily.     OVER THE COUNTER MEDICATION  Take 1 capsule by mouth 3 (three) times daily with meals. Blue/green algae     Pancrelipase (Lip-Prot-Amyl) 24000 units Cpep  Commonly known as:  CREON  Take 1 capsule (24,000 Units total) by mouth daily.     pantoprazole 40 MG tablet  Commonly known as:  PROTONIX  TAKE ONE TABLET BY MOUTH ONCE DAILY     TRIPLE ANTIBIOTIC+PAIN RELIEF EX  Apply 1 application topically daily as needed (apply to affected toe).           Follow-up Information    Follow up with Leocadio Heal V, MD In 1 week.   Specialty:  Orthopedic Surgery   Contact information:   [place] [address] [phone]       Signed: [name] 11/17/2015, 6:34 AM

## 2015-11-17 NOTE — Evaluation (Signed)
Physical Therapy Evaluation Patient Details Name: Stephen Whitaker MRN: [id number] DOB: 1955-11-02 Today's Date: 11/17/2015   History of Present Illness  Osteomyelitis of toe of left foot now s/p Left Great Toe Amputation at [place] Toe Amputation at [place]. PMH: Lt toe amputation 2015, hypertension.   Clinical Impression  Patient is s/p above surgery resulting in functional limitations due to the deficits listed below (see PT Problem List).  Patient will benefit from skilled PT to increase their independence and safety with mobility. Anticipate pt will D/C home when released. Pt reports feeling slightly short of breath after ambulation and requested use of an inhaler. Nursing notified and they are addressing. Pt request PT to return to attempt stairs. Pt left in care of nursing.       Follow Up Recommendations No PT follow up;Supervision for mobility/OOB    Equipment Recommendations  Rolling walker with 5" wheels    Recommendations for Other Services       Precautions / Restrictions Precautions Required Braces or Orthoses: Other Brace/Splint (post op shoe) Restrictions Weight Bearing Restrictions: Yes LLE Weight Bearing: Touchdown weight bearing      Mobility  Bed Mobility Overal bed mobility: Independent             General bed mobility comments: supine to sit  Transfers Overall transfer level: Modified independent Equipment used: None             General transfer comment: using bed and rail to push up from  Ambulation/Gait Ambulation/Gait assistance: Supervision Ambulation Distance (Feet): 60 Feet (10 feet with rw, 50 feet with crutches) Assistive device: Rolling walker (2 wheeled);Crutches Gait Pattern/deviations: Step-to pattern Gait velocity: decreased   General Gait Details: Cues for TTWB prior to ambulation. Educated on use of rw and crutches. Encouraging use of UEs to maintain WB restrictions with  walker and crutches.   Stairs Stairs:  (declined, reports feeling short of breath. )          Wheelchair Mobility    Modified Rankin (Stroke Patients Only)       Balance Overall balance assessment: Needs assistance Sitting-balance support: No upper extremity supported Sitting balance-Leahy Scale: Normal     Standing balance support: No upper extremity supported Standing balance-Leahy Scale: Good                               Pertinent Vitals/Pain Pain Assessment: 0-10 Pain Score: 7  Pain Location: Lt foot Pain Descriptors / Indicators: Sharp Pain Intervention(s): Limited activity within patient's tolerance;Monitored during session    Home Living Family/patient expects to be discharged to:: Private residence Living Arrangements: Other (Comment) (room at [facility])   Type of Home: Apartment Home Access: Stairs to enter Entrance Stairs-Rails: None Entrance Stairs-Number of Steps: 5 Home Layout: One level Home Equipment: Crutches      Prior Function Level of Independence: Independent               Hand Dominance        Extremity/Trunk Assessment   Upper Extremity Assessment: Overall WFL for tasks assessed           Lower Extremity Assessment: Overall WFL for tasks assessed         Communication   Communication: No difficulties  Cognition Arousal/Alertness: Awake/alert Behavior During Therapy: WFL for tasks assessed/performed Overall Cognitive Status: Within Functional Limits for tasks assessed  General Comments General comments (skin integrity, edema, etc.): Pt reports feeling short of breath, requesting rest prior to attempting stairs.     Exercises        Assessment/Plan    PT Assessment Patient needs continued PT services  PT Diagnosis Difficulty walking   PT Problem List Decreased strength;Decreased activity tolerance;Decreased balance;Decreased mobility;Decreased knowledge of use  of DME;Pain  PT Treatment Interventions DME instruction;Gait training;Stair training;Functional mobility training;Therapeutic activities;Therapeutic exercise   PT Goals (Current goals can be found in the Care Plan section) Acute Rehab PT Goals Patient Stated Goal: be able to move better and have less pain.  PT Goal Formulation: With patient Time For Goal Achievement: 12/01/15 Potential to Achieve Goals: Good    Frequency Min 5X/week   Barriers to discharge        Co-evaluation               End of Session Equipment Utilized During Treatment: Gait belt Activity Tolerance: Other (comment) (limited by shortness of breath. ) Patient left: in chair;with call bell/phone within reach Nurse Communication: Mobility status;Other (comment) (shortness of breath, nursing addressing)         Time: [id number] PT Time Calculation (min) (ACUTE ONLY): 31 min   Charges:   PT Evaluation $PT Eval Moderate Complexity: 1 Procedure PT Treatments $Gait Training: 8-22 mins   PT G Codes:        Cassell Clement, PT, CSCS Pager [phone] Office [phone]  11/17/2015, 9:39 AM

## 2015-11-17 NOTE — Progress Notes (Signed)
Discharge instructions and prescriptions provided to patient.  IV removed.  No discharge related questions.  Patient belongings with patient.  Post op boot on.  Walker with patient at time of discharge.  Patient's son is at work and unable to pick up.  Cab voucher was provided to patient to be driven to pharmacy and then home.  Patient's care is in [place] parking; [place] NT escorted patient to [place] parking to retrieve personal belongings prior to being discharged.  Patient educated that he is not able to drive home due to receipt of pain medication within 24 hours.  Son to pick up personal vehicle on tomorrow.  Escorted to cab with [place], [place].  No complaints at the time of discharge.

## 2015-11-20 NOTE — Progress Notes (Signed)
Late entry for missed G-code   12-05-2015 0942  PT G-Codes **NOT FOR INPATIENT CLASS**  Functional Assessment Tool Used clinical judgment  Functional Limitation Mobility: Walking and moving around  Mobility: Walking and Moving Around Current Status [id number]) CJ  Mobility: Walking and Moving Around Goal Status [id number]) CI  Cassell Clement, PT, CSCS Pager [phone] Office [phone]

## 2015-11-24 ENCOUNTER — Ambulatory Visit: Payer: BLUE CROSS/BLUE SHIELD | Attending: Internal Medicine | Admitting: Internal Medicine

## 2015-11-24 ENCOUNTER — Encounter: Payer: Self-pay | Admitting: Internal Medicine

## 2015-11-24 VITALS — BP 113/73 | HR 109 | Temp 98.1°F | Resp 18 | Ht 74.5 in | Wt 191.8 lb

## 2015-11-24 DIAGNOSIS — I10 Essential (primary) hypertension: Secondary | ICD-10-CM | POA: Diagnosis not present

## 2015-11-24 DIAGNOSIS — Z89412 Acquired absence of left great toe: Secondary | ICD-10-CM | POA: Diagnosis not present

## 2015-11-24 DIAGNOSIS — K861 Other chronic pancreatitis: Secondary | ICD-10-CM | POA: Insufficient documentation

## 2015-11-24 DIAGNOSIS — Z888 Allergy status to other drugs, medicaments and biological substances status: Secondary | ICD-10-CM | POA: Diagnosis not present

## 2015-11-24 DIAGNOSIS — Z79899 Other long term (current) drug therapy: Secondary | ICD-10-CM | POA: Insufficient documentation

## 2015-11-24 DIAGNOSIS — J42 Unspecified chronic bronchitis: Secondary | ICD-10-CM | POA: Insufficient documentation

## 2015-11-24 DIAGNOSIS — M1990 Unspecified osteoarthritis, unspecified site: Secondary | ICD-10-CM | POA: Diagnosis not present

## 2015-11-24 DIAGNOSIS — J449 Chronic obstructive pulmonary disease, unspecified: Secondary | ICD-10-CM | POA: Insufficient documentation

## 2015-11-24 DIAGNOSIS — M86672 Other chronic osteomyelitis, left ankle and foot: Secondary | ICD-10-CM | POA: Diagnosis not present

## 2015-11-24 DIAGNOSIS — M109 Gout, unspecified: Secondary | ICD-10-CM | POA: Diagnosis not present

## 2015-11-24 DIAGNOSIS — J322 Chronic ethmoidal sinusitis: Secondary | ICD-10-CM | POA: Insufficient documentation

## 2015-11-24 DIAGNOSIS — K219 Gastro-esophageal reflux disease without esophagitis: Secondary | ICD-10-CM | POA: Diagnosis not present

## 2015-11-24 MED ORDER — FLUTICASONE-SALMETEROL 500-50 MCG/DOSE IN AEPB: 1.0000 | INHALATION_SPRAY | Freq: Two times a day (BID) | RESPIRATORY_TRACT | Status: DC

## 2015-11-24 MED ORDER — ADULT MULTIVITAMIN W/MINERALS CH: 1.0000 | ORAL_TABLET | Freq: Every day | ORAL | Status: DC

## 2015-11-24 MED ORDER — ALBUTEROL SULFATE HFA 108 (90 BASE) MCG/ACT IN AERS: 2.0000 | INHALATION_SPRAY | RESPIRATORY_TRACT | Status: DC | PRN

## 2015-11-24 MED ORDER — PANTOPRAZOLE SODIUM 40 MG PO TBEC
40.0000 mg | DELAYED_RELEASE_TABLET | Freq: Every day | ORAL | Status: DC
Start: 2015-11-24 — End: 2016-05-07

## 2015-11-24 MED ORDER — HYDROCHLOROTHIAZIDE 25 MG PO TABS: 25.0000 mg | ORAL_TABLET | Freq: Every day | ORAL | Status: DC

## 2015-11-24 MED ORDER — PANCRELIPASE (LIP-PROT-AMYL) 24000-76000 UNITS PO CPEP: 24000.0000 [IU] | ORAL_CAPSULE | Freq: Every day | ORAL | Status: DC

## 2015-11-24 MED ORDER — MONTELUKAST SODIUM 10 MG PO TABS: 10.0000 mg | ORAL_TABLET | Freq: Every day | ORAL | Status: DC

## 2015-11-24 MED ORDER — FLUTICASONE PROPIONATE 50 MCG/ACT NA SUSP: 1.0000 | Freq: Every day | NASAL | Status: DC

## 2015-11-24 NOTE — Progress Notes (Signed)
Patient is here for HTN FU  Patient denies pain at this time.  Patient request refills on medications.  Patient has taken medications and patient has eaten today.

## 2015-11-24 NOTE — Progress Notes (Signed)
Patient ID: Stephen Whitaker, male   DOB: October 03, 1955, 60 y.o.   MRN: [id number]   Stephen Whitaker, is a 60 y.o. male  [id number]  [id number]  DOB - [birth year]  Chief Complaint  Patient presents with  . Follow-up    HTN        Subjective:   Stephen Whitaker is a 60 y.o. male at least 2 years status post amputation of the left second toe and recent amputation of the left great toe because of osteomyelitis here today for a follow up visit from surgery. He has a follow-up with orthopedic surgery tomorrow. There is no discharge from surgical site, no foul smell, no bleeding, dressing is dry. His other medical history includes hypertension, COPD, chronic sinusitis, and chronic pancreatitis, which are stable at this time. Patient has no new complaint today. No history of fever. Patient needs refill on his medications. Patient has No headache, No chest pain, No abdominal pain - No Nausea, No new weakness tingling or numbness.  Problem  Chronic Osteomyelitis of Left Foot (Hcc)  Chronic Bronchitis (Hcc)    ALLERGIES: Allergies  Allergen Reactions  . Shellfish Allergy Swelling  . Ciprofloxacin Rash and Other (See Comments)    Syncope epsiode    PAST MEDICAL HISTORY: Past Medical History  Diagnosis Date  . Hypertension   . Pancreatitis, acute   . Chronic bronchitis ([place])   . Gout   . Pneumonia 2016  . GERD (gastroesophageal reflux disease)   . Arthritis   . Herpes genitalia     MEDICATIONS AT HOME: Prior to Admission medications   Medication Sig Start Date End Date Taking? Authorizing Provider  albuterol (PROVENTIL HFA;VENTOLIN HFA) 108 (90 Base) MCG/ACT inhaler Inhale 2 puffs into the lungs every 4 (four) hours as needed for wheezing or shortness of breath. Dispense with aerochamber 11/24/15  Yes Tresa Garter, MD  allopurinol (ZYLOPRIM) 100 MG tablet Take 300 mg by mouth daily.  07/19/14  Yes Historical Provider, MD  Bioflavonoid Products (ESTER C PO) Take 1  tablet by mouth daily.   Yes Historical Provider, MD  clobetasol ointment (TEMOVATE) [id number] % Apply 1 application topically 2 (two) times daily. Patient taking differently: Apply 1 application topically 2 (two) times daily as needed (for skin irritation).  11/05/14  Yes Tresa Garter, MD  fluticasone (FLONASE) 50 MCG/ACT nasal spray Place 1 spray into both nostrils daily. 11/24/15  Yes Tresa Garter, MD  Fluticasone-Salmeterol (ADVAIR) 500-50 MCG/DOSE AEPB Inhale 1 puff into the lungs 2 (two) times daily. 11/24/15  Yes Tresa Garter, MD  hydrochlorothiazide (HYDRODIURIL) 25 MG tablet Take 1 tablet (25 mg total) by mouth daily. 11/24/15  Yes Tresa Garter, MD  ibuprofen (ADVIL,MOTRIN) 800 MG tablet Take 800 mg by mouth every 8 (eight) hours as needed for moderate pain.    Yes Historical Provider, MD  Multiple Vitamin (MULTIVITAMIN WITH MINERALS) TABS tablet Take 1 tablet by mouth daily. 11/24/15  Yes Tresa Garter, MD  Naphazoline-Pheniramine (EYE ALLERGY RELIEF OP) Place 1 drop into both eyes daily as needed (for allergy eyes).   Yes Historical Provider, MD  Neomy-Bacit-Polymyx-Pramoxine (TRIPLE ANTIBIOTIC+PAIN RELIEF EX) Apply 1 application topically daily as needed (apply to affected toe).   Yes Historical Provider, MD  OVER THE COUNTER MEDICATION Take 1 capsule by mouth 3 (three) times daily with meals. Blue/green algae   Yes Historical Provider, MD  oxyCODONE-acetaminophen (ROXICET) 5-325 MG tablet Take 1 tablet by mouth every 4 (four) hours as needed for  severe pain. 11/17/15  Yes [name], MD  Pancrelipase, Lip-Prot-Amyl, (CREON) 24000 units CPEP Take 1 capsule (24,000 Units total) by mouth daily. 11/24/15  Yes Tresa Garter, MD  pantoprazole (PROTONIX) 40 MG tablet Take 1 tablet (40 mg total) by mouth daily. 11/24/15  Yes Tresa Garter, MD  montelukast (SINGULAIR) 10 MG tablet Take 1 tablet (10 mg total) by mouth at bedtime. 11/24/15   Tresa Garter, MD      Objective:   Filed Vitals:   11/24/15 1451  BP: 113/73  Pulse: 109  Temp: 98.1 F (36.7 C)  TempSrc: Oral  Resp: 18  Height: 6' 2.5" (1.892 m)  Weight: 191 lb 12.8 oz (87 kg)    Exam General appearance : Awake, alert, not in any distress. Speech Clear. Not toxic looking HEENT: Atraumatic and Normocephalic, pupils equally reactive to light and accomodation Neck: supple, no JVD. No cervical lymphadenopathy.  Chest:Good air entry bilaterally, no added sounds  CVS: S1 S2 regular, no murmurs.  Abdomen: Bowel sounds present, Non tender and not distended with no gaurding, rigidity or rebound. Extremities: Left great toe s/p amputation, dressing clean and dry, no sign of infection. B/L Lower Ext shows no edema, both legs are warm to touch Neurology: Awake alert, and oriented X 3, CN II-XII intact, Non focal Skin:No Rash  Data Review Lab Results  Component Value Date   HGBA1C 5.6 08/10/2014     Assessment & Plan   1. Chronic osteomyelitis of left foot (HCC) - S/P Left big toe amputation, follow-up appointment tomorrow with Orthopedic surgeon - Multiple Vitamin (MULTIVITAMIN WITH MINERALS) TABS tablet; Take 1 tablet by mouth daily.  Dispense: 90 tablet; Refill: 3  2. Essential hypertension  - hydrochlorothiazide (HYDRODIURIL) 25 MG tablet; Take 1 tablet (25 mg total) by mouth daily.  Dispense: 90 tablet; Refill: 3 We have discussed target BP range and blood pressure goal. I have advised patient to check BP regularly and to call us back or report to clinic if the numbers are consistently higher than 140/90. We discussed the importance of compliance with medical therapy and DASH diet recommended, consequences of uncontrolled hypertension discussed.   - continue current BP medications  3. Other chronic pancreatitis (HCC)  - Pancrelipase, Lip-Prot-Amyl, (CREON) 24000 units CPEP; Take 1 capsule (24,000 Units total) by mouth daily.  Dispense: 180 capsule; Refill: 3 -  pantoprazole (PROTONIX) 40 MG tablet; Take 1 tablet (40 mg total) by mouth daily.  Dispense: 30 tablet; Refill: 2  4. Chronic ethmoidal sinusitis  - montelukast (SINGULAIR) 10 MG tablet; Take 1 tablet (10 mg total) by mouth at bedtime.  Dispense: 30 tablet; Refill: 3 - fluticasone (FLONASE) 50 MCG/ACT nasal spray; Place 1 spray into both nostrils daily.  Dispense: 16 g; Refill: 3  5. Chronic bronchitis, unspecified chronic bronchitis type (HCC)  - Fluticasone-Salmeterol (ADVAIR) 500-50 MCG/DOSE AEPB; Inhale 1 puff into the lungs 2 (two) times daily.  Dispense: 60 each; Refill: 3 - albuterol (PROVENTIL HFA;VENTOLIN HFA) 108 (90 Base) MCG/ACT inhaler; Inhale 2 puffs into the lungs every 4 (four) hours as needed for wheezing or shortness of breath. Dispense with aerochamber  Dispense: 1 Inhaler; Refill: 1  Patient have been counseled extensively about nutrition and exercise  Return in about 3 months (around 02/23/2016) for Chronic Osteomyelitis, Follow up Pain and comorbidities, Follow up HTN.  The patient was given clear instructions to go to ER or return to medical center if symptoms don't improve, worsen or new problems develop.  The patient verbalized understanding. The patient was told to call to get lab results if they haven't heard anything in the next week.   This note has been created with Surveyor, quantity. Any transcriptional errors are unintentional.    Angelica Chessman, MD, [place], Karilyn Cota, [place] and [place], [place]   11/24/2015, 3:32 PM

## 2015-11-24 NOTE — Patient Instructions (Signed)
DASH Eating Plan DASH stands for "Dietary Approaches to Stop Hypertension." The DASH eating plan is a healthy eating plan that has been shown to reduce high blood pressure (hypertension). Additional health benefits may include reducing the risk of type 2 diabetes mellitus, heart disease, and stroke. The DASH eating plan may also help with weight loss. WHAT DO I NEED TO KNOW ABOUT THE DASH EATING PLAN? For the DASH eating plan, you will follow these general guidelines:  Choose foods with a percent daily value for sodium of less than 5% (as listed on the food label).  Use salt-free seasonings or herbs instead of table salt or sea salt.  Check with your health care provider or pharmacist before using salt substitutes.  Eat lower-sodium products, often labeled as "lower sodium" or "no salt added."  Eat fresh foods.  Eat more vegetables, fruits, and low-fat dairy products.  Choose whole grains. Look for the word "whole" as the first word in the ingredient list.  Choose fish and skinless chicken or turkey more often than red meat. Limit fish, poultry, and meat to 6 oz (170 g) each day.  Limit sweets, desserts, sugars, and sugary drinks.  Choose heart-healthy fats.  Limit cheese to 1 oz (28 g) per day.  Eat more home-cooked food and less restaurant, buffet, and fast food.  Limit fried foods.  Cook foods using methods other than frying.  Limit canned vegetables. If you do use them, rinse them well to decrease the sodium.  When eating at a restaurant, ask that your food be prepared with less salt, or no salt if possible. WHAT FOODS CAN I EAT? Seek help from a dietitian for individual calorie needs. Grains Whole grain or whole wheat bread. Brown rice. Whole grain or whole wheat pasta. Quinoa, bulgur, and whole grain cereals. Low-sodium cereals. Corn or whole wheat flour tortillas. Whole grain cornbread. Whole grain crackers. Low-sodium crackers. Vegetables Fresh or frozen vegetables  (raw, steamed, roasted, or grilled). Low-sodium or reduced-sodium tomato and vegetable juices. Low-sodium or reduced-sodium tomato sauce and paste. Low-sodium or reduced-sodium canned vegetables.  Fruits All fresh, canned (in natural juice), or frozen fruits. Meat and Other Protein Products Ground beef (85% or leaner), grass-fed beef, or beef trimmed of fat. Skinless chicken or turkey. Ground chicken or turkey. Pork trimmed of fat. All fish and seafood. Eggs. Dried beans, peas, or lentils. Unsalted nuts and seeds. Unsalted canned beans. Dairy Low-fat dairy products, such as skim or 1% milk, 2% or reduced-fat cheeses, low-fat ricotta or cottage cheese, or plain low-fat yogurt. Low-sodium or reduced-sodium cheeses. Fats and Oils Tub margarines without trans fats. Light or reduced-fat mayonnaise and salad dressings (reduced sodium). Avocado. Safflower, olive, or canola oils. Natural peanut or almond butter. Other Unsalted popcorn and pretzels. The items listed above may not be a complete list of recommended foods or beverages. Contact your dietitian for more options. WHAT FOODS ARE NOT RECOMMENDED? Grains White bread. White pasta. White rice. Refined cornbread. Bagels and croissants. Crackers that contain trans fat. Vegetables Creamed or fried vegetables. Vegetables in a cheese sauce. Regular canned vegetables. Regular canned tomato sauce and paste. Regular tomato and vegetable juices. Fruits Dried fruits. Canned fruit in light or heavy syrup. Fruit juice. Meat and Other Protein Products Fatty cuts of meat. Ribs, chicken wings, bacon, sausage, bologna, salami, chitterlings, fatback, hot dogs, bratwurst, and packaged luncheon meats. Salted nuts and seeds. Canned beans with salt. Dairy Whole or 2% milk, cream, half-and-half, and cream cheese. Whole-fat or sweetened yogurt. Full-fat   cheeses or blue cheese. Nondairy creamers and whipped toppings. Processed cheese, cheese spreads, or cheese  curds. Condiments Onion and garlic salt, seasoned salt, table salt, and sea salt. Canned and packaged gravies. Worcestershire sauce. Tartar sauce. Barbecue sauce. Teriyaki sauce. Soy sauce, including reduced sodium. Steak sauce. Fish sauce. Oyster sauce. Cocktail sauce. Horseradish. Ketchup and mustard. Meat flavorings and tenderizers. Bouillon cubes. Hot sauce. Tabasco sauce. Marinades. Taco seasonings. Relishes. Fats and Oils Butter, stick margarine, lard, shortening, ghee, and bacon fat. Coconut, palm kernel, or palm oils. Regular salad dressings. Other Pickles and olives. Salted popcorn and pretzels. The items listed above may not be a complete list of foods and beverages to avoid. Contact your dietitian for more information. WHERE CAN I FIND MORE INFORMATION? National [employer], [employer], [employer]: www.nhlbi.nih.gov/health/health-topics/topics/dash/   This information is not intended to replace advice given to you by your health care provider. Make sure you discuss any questions you have with your health care provider.   Document Released: 07/26/2011 Document Revised: 08/27/2014 Document Reviewed: 06/10/2013 Elsevier Interactive Patient Education 2016 Elsevier Inc[employer] Hypertension Hypertension, commonly called high blood pressure, is when the force of blood pumping through your arteries is too strong. Your arteries are the blood vessels that carry blood from your heart throughout your body. A blood pressure reading consists of a higher number over a lower number, such as 110/72. The higher number (systolic) is the pressure inside your arteries when your heart pumps. The lower number (diastolic) is the pressure inside your arteries when your heart relaxes. Ideally you want your blood pressure below 120/80. Hypertension forces your heart to work harder to pump blood. Your arteries may become narrow or stiff. Having untreated or uncontrolled hypertension can cause heart attack, stroke, kidney  disease, and other problems. RISK FACTORS Some risk factors for high blood pressure are controllable. Others are not.  Risk factors you cannot control include:   Race. You may be at higher risk if you are African American.  Age. Risk increases with age.  Gender. Men are at higher risk than women before age 45 years. After age 65, women are at higher risk than men. Risk factors you can control include:  Not getting enough exercise or physical activity.  Being overweight.  Getting too much fat, sugar, calories, or salt in your diet.  Drinking too much alcohol. SIGNS AND SYMPTOMS Hypertension does not usually cause signs or symptoms. Extremely high blood pressure (hypertensive crisis) may cause headache, anxiety, shortness of breath, and nosebleed. DIAGNOSIS To check if you have hypertension, your health care provider will measure your blood pressure while you are seated, with your arm held at the level of your heart. It should be measured at least twice using the same arm. Certain conditions can cause a difference in blood pressure between your right and left arms. A blood pressure reading that is higher than normal on one occasion does not mean that you need treatment. If it is not clear whether you have high blood pressure, you may be asked to return on a different day to have your blood pressure checked again. Or, you may be asked to monitor your blood pressure at home for 1 or more weeks. TREATMENT Treating high blood pressure includes making lifestyle changes and possibly taking medicine. Living a healthy lifestyle can help lower high blood pressure. You may need to change some of your habits. Lifestyle changes may include:  Following the DASH diet. This diet is high in fruits, vegetables, and whole grains.   It is low in salt, red meat, and added sugars.  Keep your sodium intake below 2,300 mg per day.  Getting at least 30-45 minutes of aerobic exercise at least 4 times per  week.  Losing weight if necessary.  Not smoking.  Limiting alcoholic beverages.  Learning ways to reduce stress. Your health care provider may prescribe medicine if lifestyle changes are not enough to get your blood pressure under control, and if one of the following is true:  You are 26-64 years of age and your systolic blood pressure is above 140.  You are 61 years of age or older, and your systolic blood pressure is above 150.  Your diastolic blood pressure is above 90.  You have diabetes, and your systolic blood pressure is over [id number] or your diastolic blood pressure is over 90.  You have kidney disease and your blood pressure is above 140/90.  You have heart disease and your blood pressure is above 140/90. Your personal target blood pressure may vary depending on your medical conditions, your age, and other factors. HOME CARE INSTRUCTIONS  Have your blood pressure rechecked as directed by your health care provider.   Take medicines only as directed by your health care provider. Follow the directions carefully. Blood pressure medicines must be taken as prescribed. The medicine does not work as well when you skip doses. Skipping doses also puts you at risk for problems.  Do not smoke.   Monitor your blood pressure at home as directed by your health care provider. SEEK MEDICAL CARE IF:   You think you are having a reaction to medicines taken.  You have recurrent headaches or feel dizzy.  You have swelling in your ankles.  You have trouble with your vision. SEEK IMMEDIATE MEDICAL CARE IF:  You develop a severe headache or confusion.  You have unusual weakness, numbness, or feel faint.  You have severe chest or abdominal pain.  You vomit repeatedly.  You have trouble breathing. MAKE SURE YOU:   Understand these instructions.  Will watch your condition.  Will get help right away if you are not doing well or get worse.   This information is not intended to  replace advice given to you by your health care provider. Make sure you discuss any questions you have with your health care provider.   Document Released: 08/06/2005 Document Revised: 12/21/2014 Document Reviewed: 05/29/2013 Elsevier Interactive Patient Education 2016 Elsevier Inc. Bone and Joint Infections, Adult Bone infections (osteomyelitis) and joint infections (septic arthritis) occur when bacteria or other germs get inside a bone or a joint. This can happen if you have an infection in another part of your body that spreads through your blood. Germs from your skin or from outside of your body can also cause this type of infection if you have a wound or a broken bone (fracture) that breaks the skin. Anyone can get a bone infection or joint infection. You may be more likely to get this type of infection if you have a condition, such as diabetes, that lowers your ability to fight infection or increases your chances of getting an infection. Bone and joint infections can cause damage, and they can spread to other areas of your body. They need to be treated quickly. CAUSES Most bone and joint infections are caused by bacteria. They can also be caused by other germs, such as viruses and funguses. RISK FACTORS This condition is more likely to develop in:  People who recently had surgery, especially bone  or joint surgery.  People who have a long-term (chronic) disease, such as:  HIV (human immunodeficiency virus).  Diabetes.  Rheumatoid arthritis.  Sickle cell anemia.  Elderly people.  People who take medicines that block or weaken the body's defense system (immune system).  People who have a condition that reduces their blood flow.  People who are on kidney dialysis.  People who have an artificial joint.  People who have had a joint or bone repaired with plates or screws (surgical hardware).  People who use or abuse IV drugs.  People who have had trauma, such as stepping on a  nail. SYMPTOMS Symptoms vary depending on the type and location of your infection. Common symptoms of bone and joint infections include:  Fever and chills.  Redness and warmth.  Swelling.  Pain and stiffness.  Drainage of fluid or pus near the infection.  Weight loss and fatigue.  Decreased ability to use a hand or foot. DIAGNOSIS This condition may be diagnosed based on symptoms, medical history, a physical exam, and diagnostic tests. Tests can help to identify the cause of the infection. You may have various tests, such as:  A sample of tissue, fluid, or blood taken to be examined under a microscope.  A procedure to remove fluid from the infected joint with a needle (joint aspiration) for testing in a lab.  Pus or discharge swabbed from a wound for testing to identify germs and to determine what type of medicine will kill them (culture and sensitivity).  Blood tests to look for evidence of infection and inflammation (biomarkers).  Imaging studies to determine how severe the bone or joint infection is. These may include:  X-rays.  CT scan.  MRI.  Bone scan. TREATMENT Treatment depends on the cause and type of infection. Antibiotic medicines are usually the first treatment for a bone or joint infection. Treatment with antibiotics may include:  Getting IV antibiotics. This may be done in a hospital at first. You may have to continue IV antibiotics at home for several weeks. You may also have to take antibiotics by mouth for several weeks after that.  Taking more than one kind of antibiotic. Treatment may start with a type of antibiotic that works against many different bacteria (broad spectrumantibiotics). IV antibiotics may be changed if tests show that another type may work better. Other treatments may include:  Draining fluid from the joint by placing a needle into it (aspiration).  Surgery to remove:  Dead or dying tissue from a bone or joint.  An infected  artificial joint.  Infected plates or screws that were used to repair a broken bone. HOME CARE INSTRUCTIONS  Take medicines only as directed by your health care provider.  Take your antibiotic medicine as directed by your health care provider. Finish the antibiotic even if you start to feel better.  Follow instructions from your health care provider about how to take IV antibiotics at home.  Ask your health care provider if you have any restrictions on your activities.  Keep all follow-up visits as directed by your health care provider. This is important. SEEK MEDICAL CARE IF:  You have a fever or chills.  You have redness, warmth, pain, or swelling that returns after treatment. SEEK IMMEDIATE MEDICAL CARE IF:  You have rapid breathing or you have trouble breathing.  You have chest pain.  You cannot drink fluids or make urine.  The affected arm or leg swells, changes color, or turns blue.   This information  is not intended to replace advice given to you by your health care provider. Make sure you discuss any questions you have with your health care provider.   Document Released: 08/06/2005 Document Revised: 12/21/2014 Document Reviewed: 08/04/2014 Elsevier Interactive Patient Education 2016 Elsevier Inc. Gout Gout is an inflammatory arthritis caused by a buildup of uric acid crystals in the joints. Uric acid is a chemical that is normally present in the blood. When the level of uric acid in the blood is too high it can form crystals that deposit in your joints and tissues. This causes joint redness, soreness, and swelling (inflammation). Repeat attacks are common. Over time, uric acid crystals can form into masses (tophi) near a joint, destroying bone and causing disfigurement. Gout is treatable and often preventable. CAUSES  The disease begins with elevated levels of uric acid in the blood. Uric acid is produced by your body when it breaks down a naturally found substance called  purines. Certain foods you eat, such as meats and fish, contain high amounts of purines. Causes of an elevated uric acid level include:  Being passed down from parent to child (heredity).  Diseases that cause increased uric acid production (such as obesity, psoriasis, and certain cancers).  Excessive alcohol use.  Diet, especially diets rich in meat and seafood.  Medicines, including certain cancer-fighting medicines (chemotherapy), water pills (diuretics), and aspirin.  Chronic kidney disease. The kidneys are no longer able to remove uric acid well.  Problems with metabolism. Conditions strongly associated with gout include:  Obesity.  High blood pressure.  High cholesterol.  Diabetes. Not everyone with elevated uric acid levels gets gout. It is not understood why some people get gout and others do not. Surgery, joint injury, and eating too much of certain foods are some of the factors that can lead to gout attacks. SYMPTOMS   An attack of gout comes on quickly. It causes intense pain with redness, swelling, and warmth in a joint.  Fever can occur.  Often, only one joint is involved. Certain joints are more commonly involved:  Base of the big toe.  Knee.  Ankle.  Wrist.  Finger. Without treatment, an attack usually goes away in a few days to weeks. Between attacks, you usually will not have symptoms, which is different from many other forms of arthritis. DIAGNOSIS  Your caregiver will suspect gout based on your symptoms and exam. In some cases, tests may be recommended. The tests may include:  Blood tests.  Urine tests.  X-rays.  Joint fluid exam. This exam requires a needle to remove fluid from the joint (arthrocentesis). Using a microscope, gout is confirmed when uric acid crystals are seen in the joint fluid. TREATMENT  There are two phases to gout treatment: treating the sudden onset (acute) attack and preventing attacks (prophylaxis).  Treatment of an  Acute Attack.  Medicines are used. These include anti-inflammatory medicines or steroid medicines.  An injection of steroid medicine into the affected joint is sometimes necessary.  The painful joint is rested. Movement can worsen the arthritis.  You may use warm or cold treatments on painful joints, depending which works best for you.  Treatment to Prevent Attacks.  If you suffer from frequent gout attacks, your caregiver may advise preventive medicine. These medicines are started after the acute attack subsides. These medicines either help your kidneys eliminate uric acid from your body or decrease your uric acid production. You may need to stay on these medicines for a very long time.  The early phase of treatment with preventive medicine can be associated with an increase in acute gout attacks. For this reason, during the first few months of treatment, your caregiver may also advise you to take medicines usually used for acute gout treatment. Be sure you understand your caregiver's directions. Your caregiver may make several adjustments to your medicine dose before these medicines are effective.  Discuss dietary treatment with your caregiver or dietitian. Alcohol and drinks high in sugar and fructose and foods such as meat, poultry, and seafood can increase uric acid levels. Your caregiver or dietitian can advise you on drinks and foods that should be limited. HOME CARE INSTRUCTIONS   Do not take aspirin to relieve pain. This raises uric acid levels.  Only take over-the-counter or prescription medicines for pain, discomfort, or fever as directed by your caregiver.  Rest the joint as much as possible. When in bed, keep sheets and blankets off painful areas.  Keep the affected joint raised (elevated).  Apply warm or cold treatments to painful joints. Use of warm or cold treatments depends on which works best for you.  Use crutches if the painful joint is in your leg.  Drink enough  fluids to keep your urine clear or pale yellow. This helps your body get rid of uric acid. Limit alcohol, sugary drinks, and fructose drinks.  Follow your dietary instructions. Pay careful attention to the amount of protein you eat. Your daily diet should emphasize fruits, vegetables, whole grains, and fat-free or low-fat milk products. Discuss the use of coffee, vitamin C, and cherries with your caregiver or dietitian. These may be helpful in lowering uric acid levels.  Maintain a healthy body weight. SEEK MEDICAL CARE IF:   You develop diarrhea, vomiting, or any side effects from medicines.  You do not feel better in 24 hours, or you are getting worse. SEEK IMMEDIATE MEDICAL CARE IF:   Your joint becomes suddenly more tender, and you have chills or a fever. MAKE SURE YOU:   Understand these instructions.  Will watch your condition.  Will get help right away if you are not doing well or get worse.   This information is not intended to replace advice given to you by your health care provider. Make sure you discuss any questions you have with your health care provider.   Document Released: 08/03/2000 Document Revised: 08/27/2014 Document Reviewed: 03/19/2012 Elsevier Interactive Patient Education [employer].

## 2015-11-30 ENCOUNTER — Telehealth: Payer: Self-pay | Admitting: Internal Medicine

## 2015-11-30 NOTE — Telephone Encounter (Signed)
Patient called to request a prescription for Cialis, patient would like to get some samples from the pharmacy but the pharmacy needs a prescription. Please f/u

## 2016-01-31 ENCOUNTER — Other Ambulatory Visit: Payer: Self-pay | Admitting: Internal Medicine

## 2016-01-31 NOTE — Telephone Encounter (Signed)
Patient needs appointment.

## 2016-01-31 NOTE — Telephone Encounter (Signed)
Routed to PCP for approval.

## 2016-02-09 NOTE — Telephone Encounter (Signed)
Patient needs to be scheduled an appointment to address this concern.

## 2016-03-30 ENCOUNTER — Other Ambulatory Visit: Payer: Self-pay | Admitting: Internal Medicine

## 2016-03-30 DIAGNOSIS — J42 Unspecified chronic bronchitis: Secondary | ICD-10-CM

## 2016-04-09 ENCOUNTER — Other Ambulatory Visit: Payer: Self-pay | Admitting: Internal Medicine

## 2016-04-09 DIAGNOSIS — J42 Unspecified chronic bronchitis: Secondary | ICD-10-CM

## 2016-04-10 ENCOUNTER — Telehealth: Payer: Self-pay | Admitting: Internal Medicine

## 2016-04-10 NOTE — Telephone Encounter (Signed)
Patient is needing to be prescribed Cialis. Patient said medication is now being covered by insurance. Please follow up.

## 2016-04-10 NOTE — Telephone Encounter (Signed)
I cannot prescribe Cialis - will forward request to Dr. Doreene Burke.

## 2016-04-14 ENCOUNTER — Other Ambulatory Visit: Payer: Self-pay | Admitting: Internal Medicine

## 2016-04-17 ENCOUNTER — Other Ambulatory Visit: Payer: Self-pay | Admitting: Internal Medicine

## 2016-04-17 NOTE — Telephone Encounter (Signed)
Patient needs appointment to evaluate the need for Cialis. It is not on his medication list.

## 2016-04-20 NOTE — Telephone Encounter (Signed)
Medical Assistant left message on patient's home and cell voicemail. Voicemail states to give a call back to [place] with [facility] at [phone].  !!!Patient needs an appointment to be evaluated for his Cialis need!!!

## 2016-04-21 IMAGING — CR DG TOE GREAT 2+V*L*
1 series · 3 of 3 positions shown · non-contrast
Comparison: Left foot radiographs performed 06/14/2014

CLINICAL DATA: Ulceration at the plantar aspect of the great toe.
Initial encounter.

EXAM:
LEFT GREAT TOE

[Series 1: AP · U · 3 of 3 slices shown]
[im 1/3]
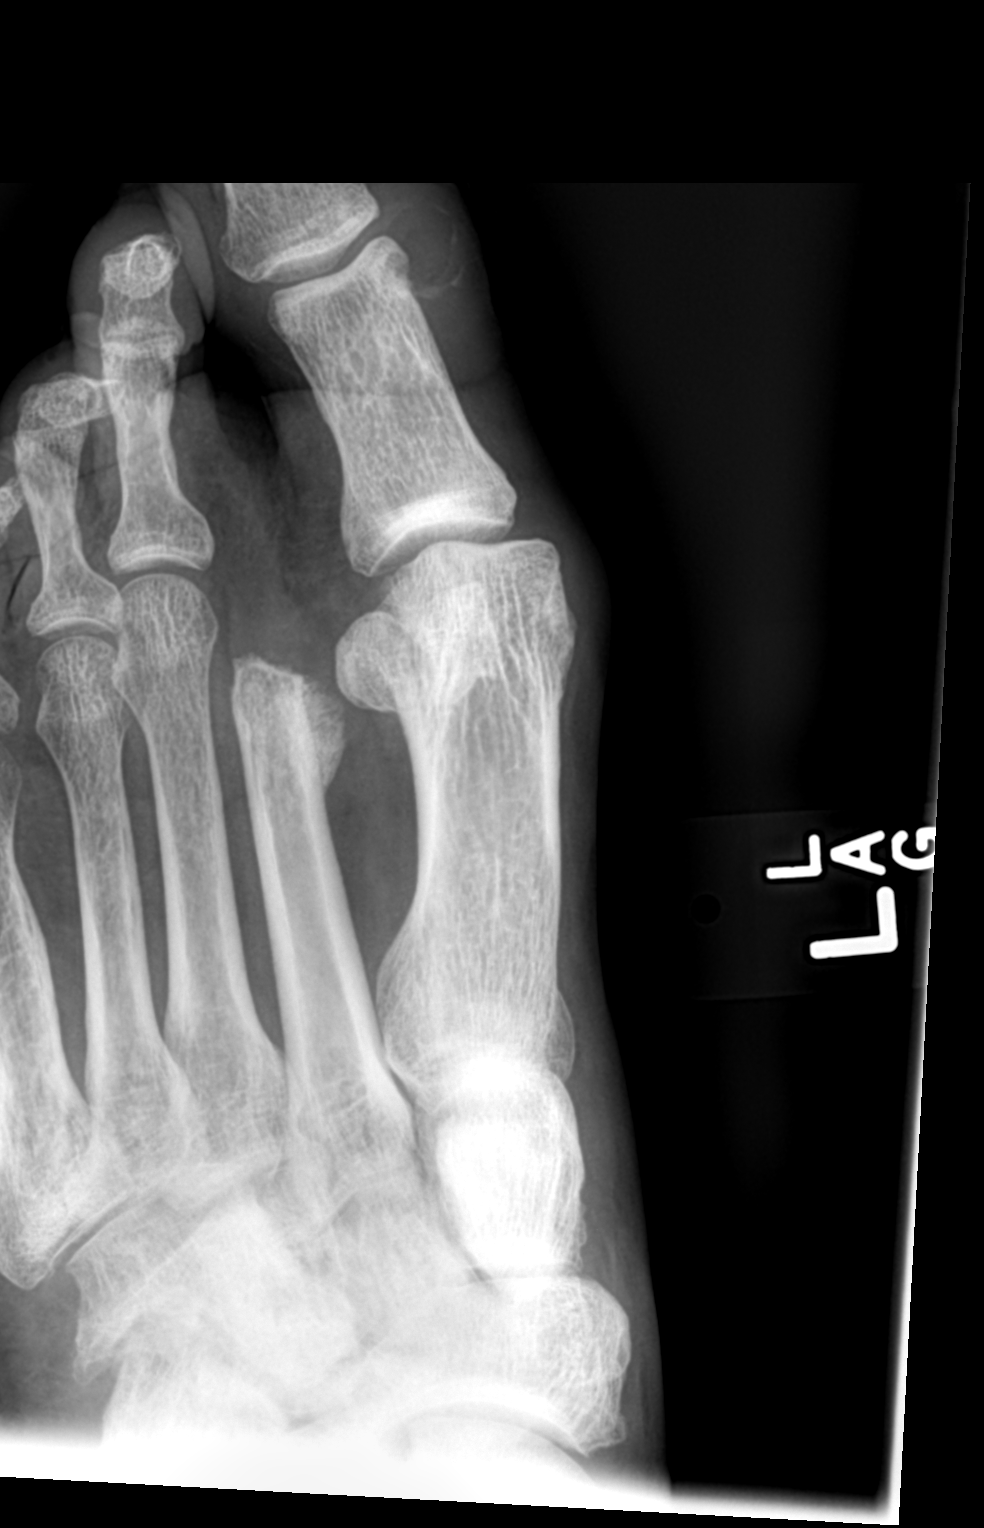
[im 2/3]
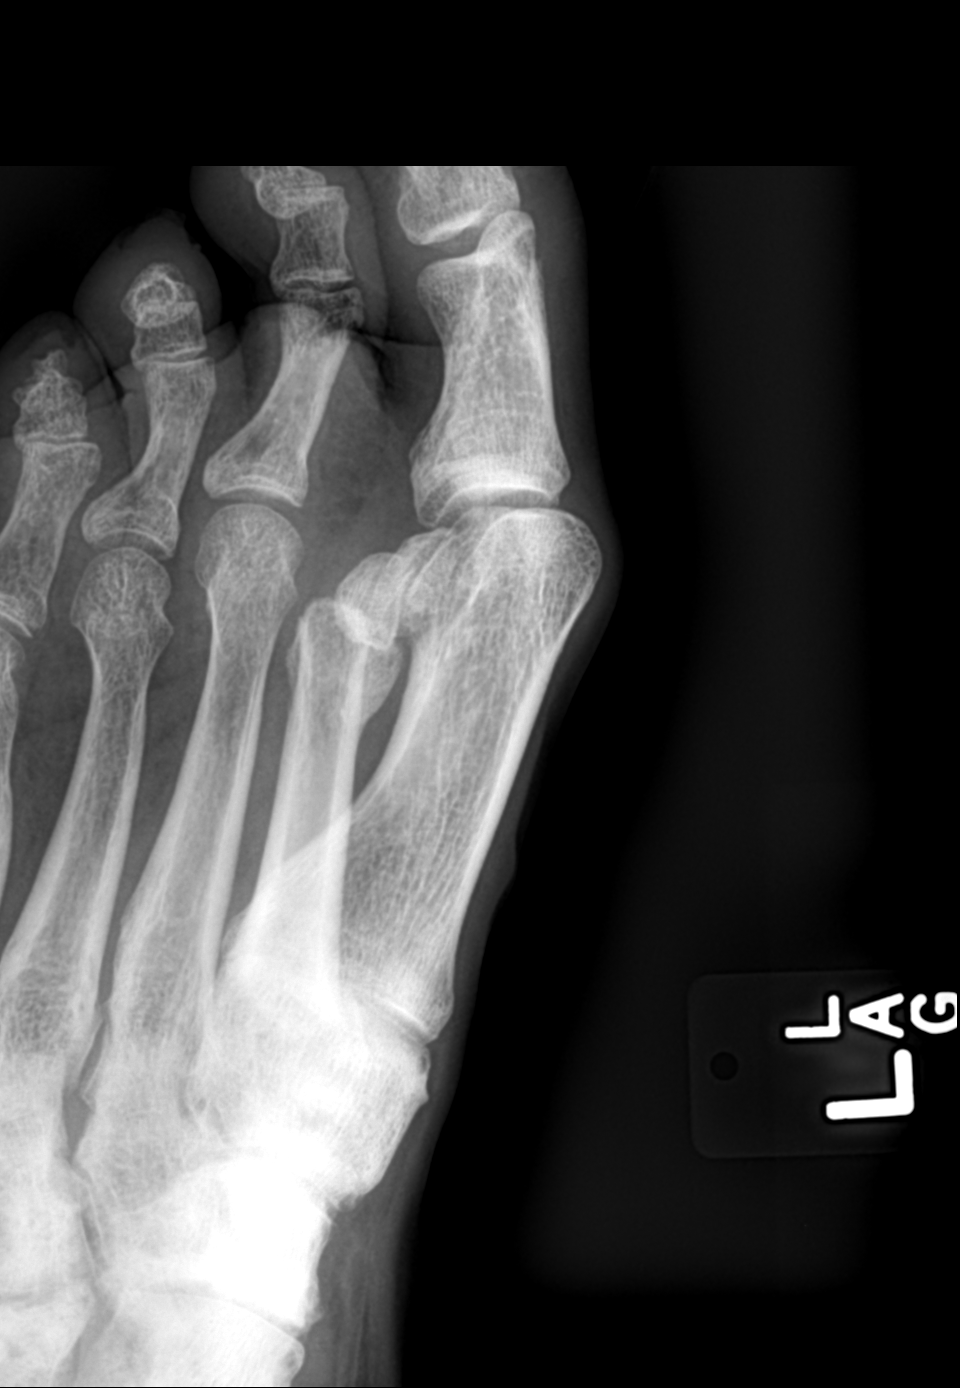
[im 3/3]
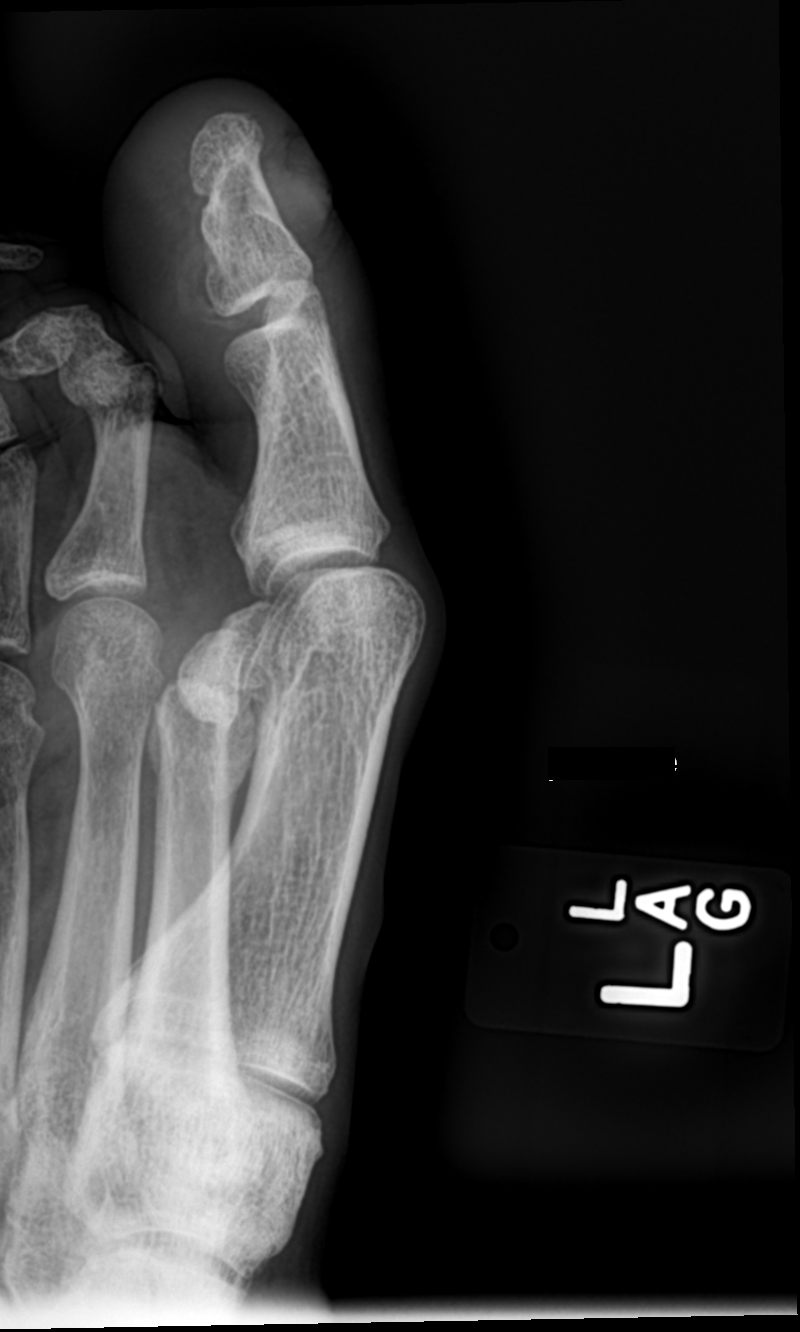

[3 of 3 positions shown; findings below may reference images not displayed]

FINDINGS: The known soft tissue ulceration is difficult to fully characterize
on radiograph. No radiopaque foreign bodies are seen. Scattered soft
tissue calcifications are suggested at the great toe. There is
suggestion of a small erosion at the medial distal aspect of the
first proximal phalanx.

The visualized joint spaces are preserved. The patient is status
post amputation of the distal second metatarsal. No osseous erosions
are
IMPRESSION: 1. Suggestion of a small erosion at the medial distal aspect of the
first proximal phalanx; osteomyelitis is a concern. MRI could be
considered for further evaluation as deemed clinically appropriate.
2. Soft tissue ulceration not well characterized on radiograph. No
radiopaque foreign bodies seen. Scattered soft calcifications
suggested at the great toe.

## 2016-04-21 IMAGING — MR MR FOOT*L* W/O CM
4 of 6 series · 22 of 40 positions shown · non-contrast
Comparison: Plain films of the left great toe [DATE]/ 3196 and
plain films left foot 06/14/2014.

CLINICAL DATA: Ulceration on the plantar aspect of the great toe.
Elevated white blood cell count and fever. Initial encounter

EXAM:
MRI OF THE LEFT FOREFOOT WITHOUT CONTRAST
TECHNIQUE: Multiplanar, multisequence MR imaging was performed. No intravenous
contrast was administered.

[Series 3: T1 · coronal · 4.0mm · 0.29mm/px · 9 of 32 slices shown (1 of 2)]
[im 1/32]
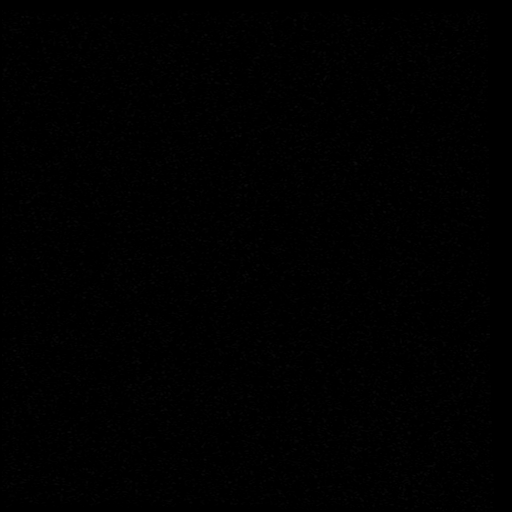
[im 4/32]
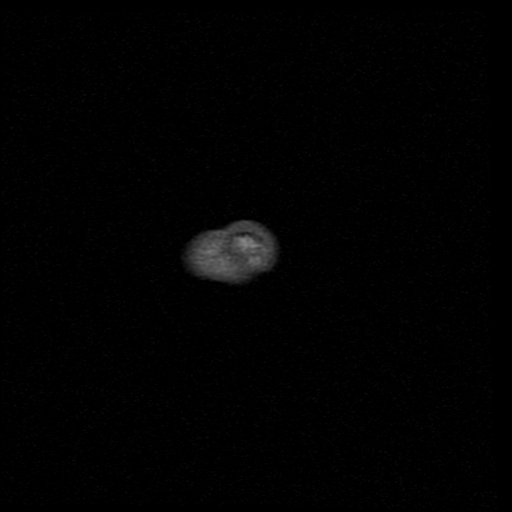
[im 8/32]
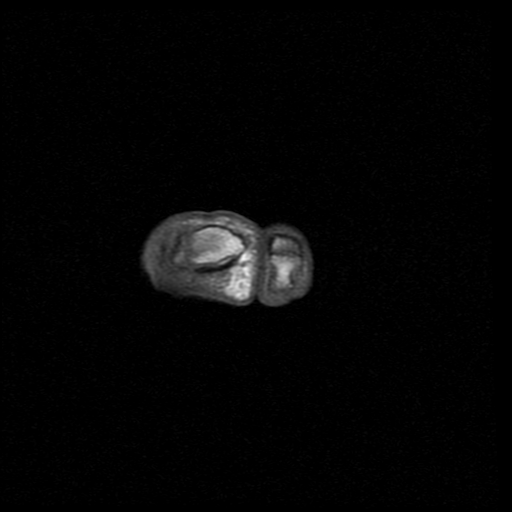
[im 12/32]
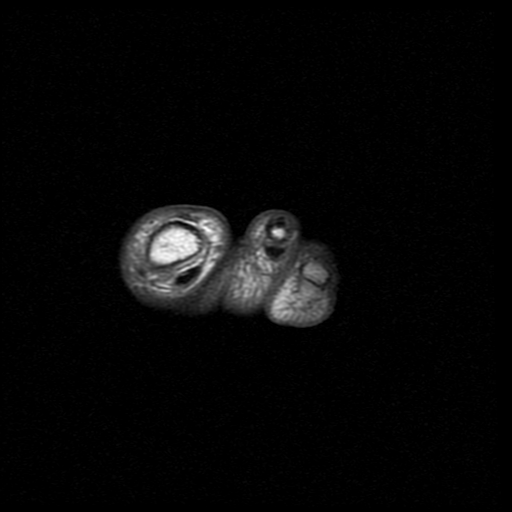
[im 16/32]
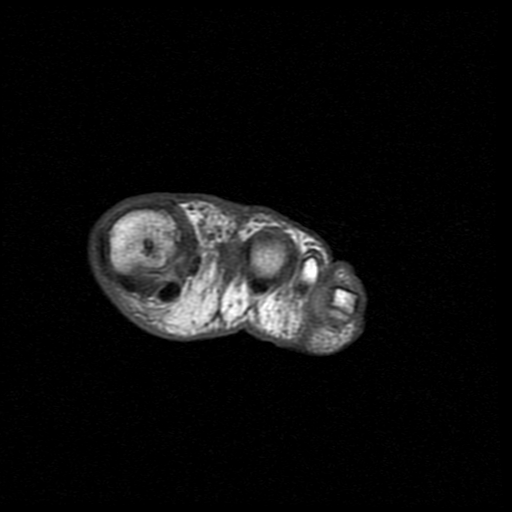
[im 20/32]
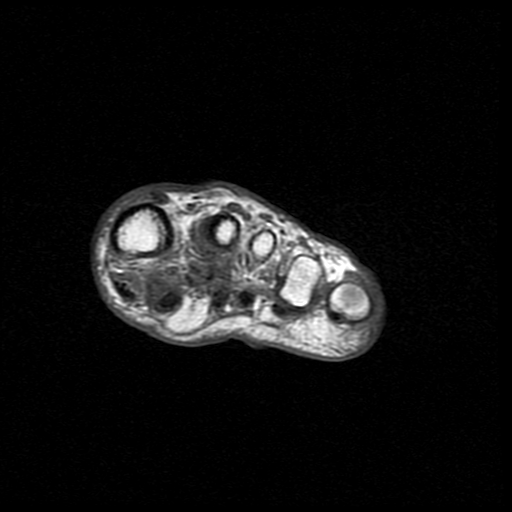
[im 24/32]
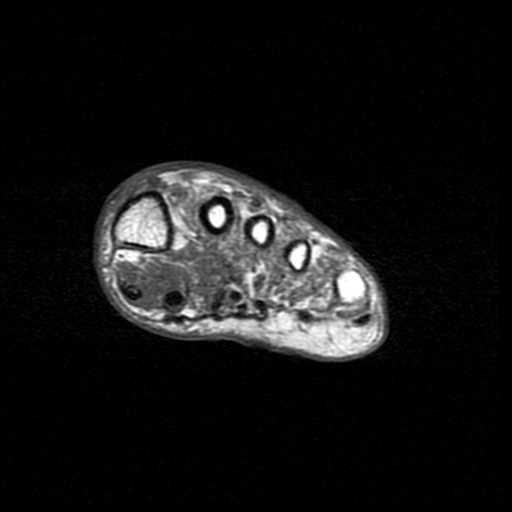
[im 28/32]
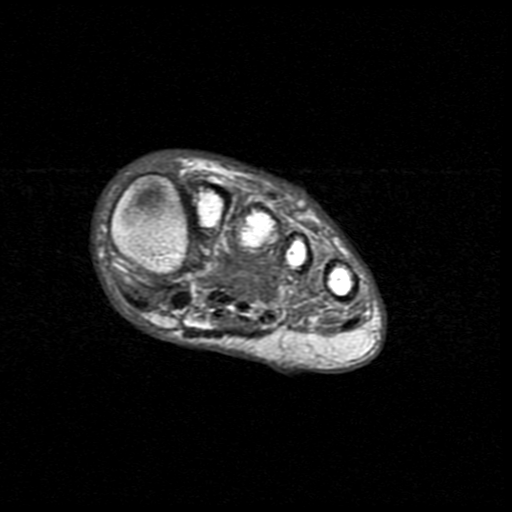
[im 32/32]
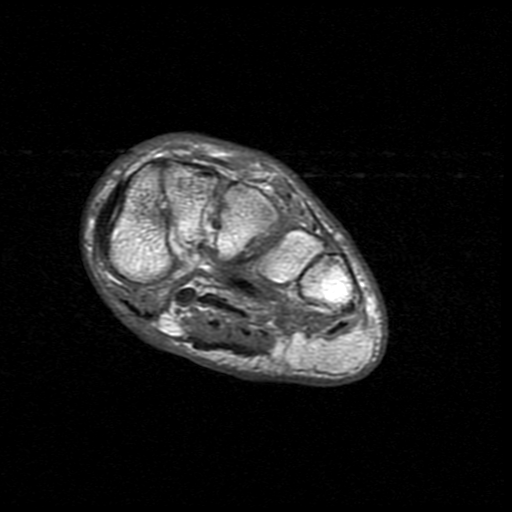

[Series 4: T2 · coronal · 4.0mm · 0.29mm/px · 5 of 32 slices shown]
[im 1/32]
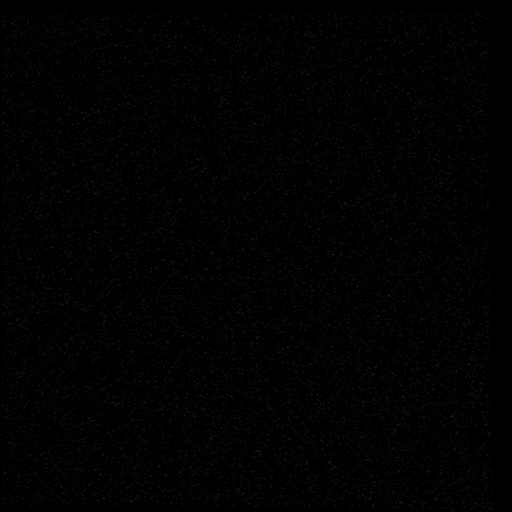
[im 4/32]
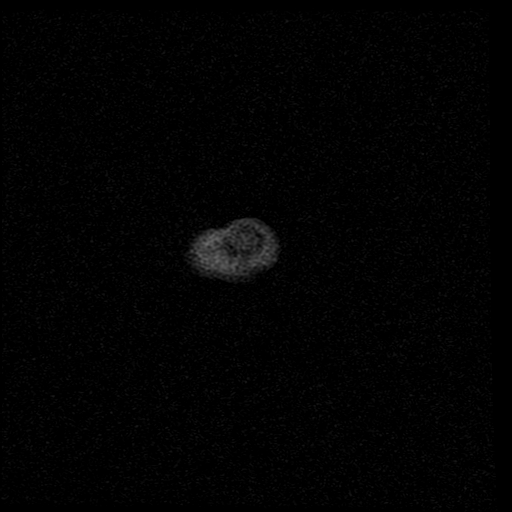
[im 8/32]
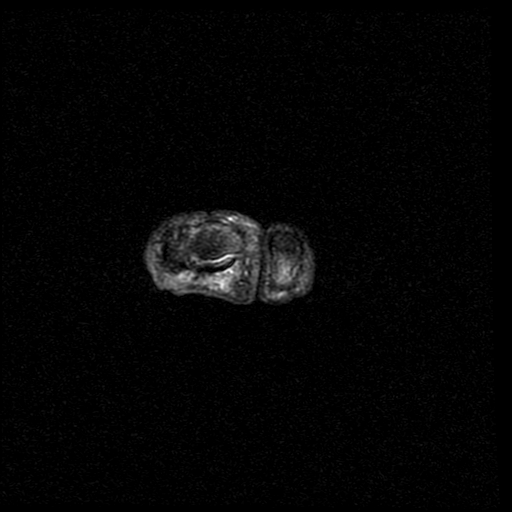
[im 16/32]
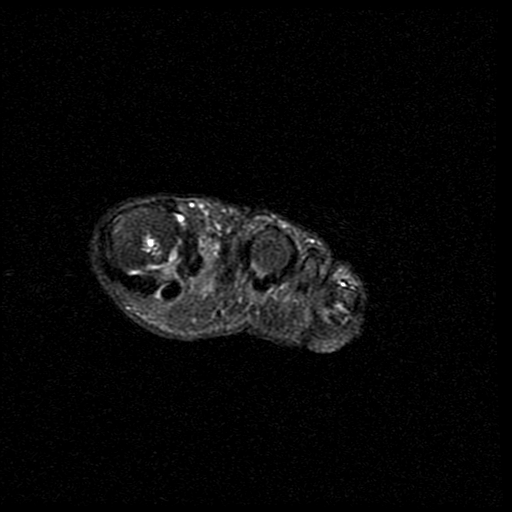
[im 28/32]
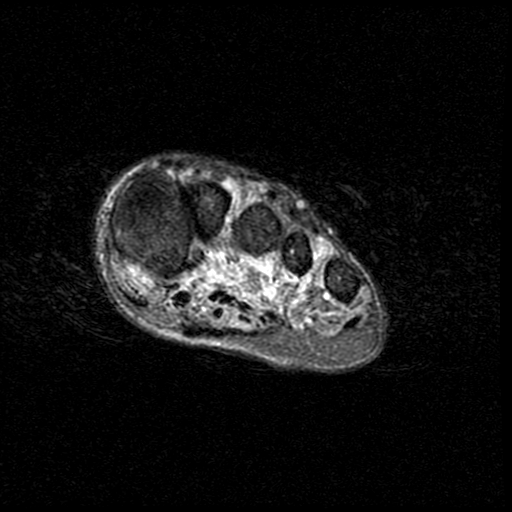

[Series 6: T1 · axial · 4.0mm · 0.62mm/px · z∈[-45,+30]mm · 5 of 17 slices shown (2 of 2)]
[im 1/17]
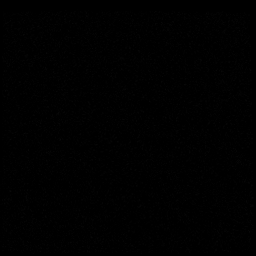
[im 5/17]
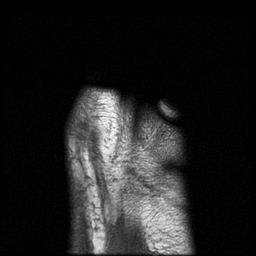
[im 9/17]
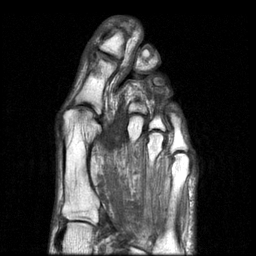
[im 13/17]
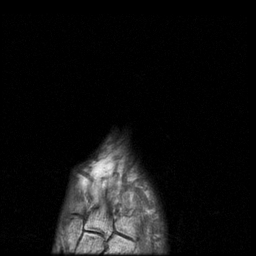
[im 17/17]
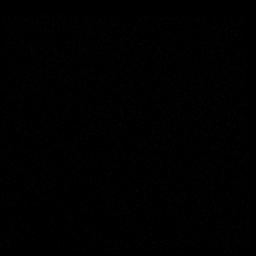

[Series 7: STIR · axial · 4.0mm · 0.31mm/px · z∈[-45,+30]mm · 3 of 17 slices shown]
[im 1/17]
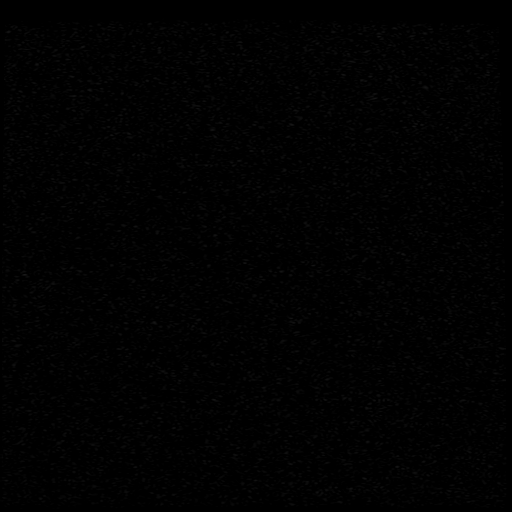
[im 9/17]
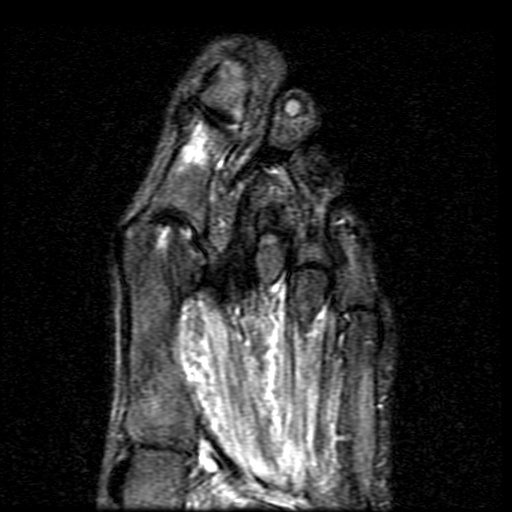
[im 17/17]
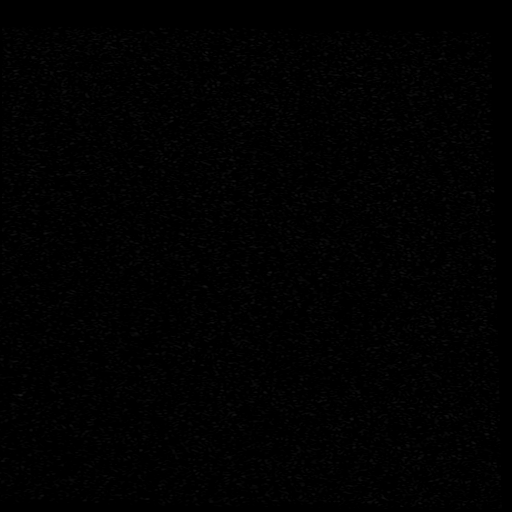

[22 of 40 positions shown; findings below may reference images not displayed]

FINDINGS: There appears to be a skin ulceration on the plantar surface of the
great toe just distal to the IP joint. There is marrow edema in the
distal 1.4 cm of the proximal phalanx of the great toe eccentric on
the medial side. The small cortical defect seen on the most recent
plain films appears well-marginated without adjacent edema.

There is first MTP joint space narrowing with mild subchondral edema
in the central head of the first metatarsal consistent with
osteoarthritis. Bone marrow signal is otherwise unremarkable. No
fluid collection is identified. Intrinsic musculature the foot is
somewhat atrophic.
IMPRESSION: Skin ulceration appears to be on the plantar surface of the great
toe at or just distal to the IP joint. There is no abscess. Edema in
the distal 1.4 cm of the proximal phalanx of the great toe could be
secondary to osteomyelitis, inflammatory change or stress change.

Mild appearing first MTP osteoarthritis.

## 2016-05-03 ENCOUNTER — Other Ambulatory Visit: Payer: Self-pay | Admitting: Internal Medicine

## 2016-05-07 ENCOUNTER — Other Ambulatory Visit: Payer: Self-pay | Admitting: Internal Medicine

## 2016-05-17 ENCOUNTER — Encounter: Payer: Self-pay | Admitting: Internal Medicine

## 2016-05-17 ENCOUNTER — Ambulatory Visit: Payer: BLUE CROSS/BLUE SHIELD | Attending: Internal Medicine | Admitting: Internal Medicine

## 2016-05-17 VITALS — BP 122/74 | HR 83 | Temp 98.1°F | Resp 17 | Ht 74.5 in | Wt 190.6 lb

## 2016-05-17 DIAGNOSIS — B351 Tinea unguium: Secondary | ICD-10-CM | POA: Insufficient documentation

## 2016-05-17 DIAGNOSIS — Z79899 Other long term (current) drug therapy: Secondary | ICD-10-CM | POA: Diagnosis not present

## 2016-05-17 DIAGNOSIS — Z881 Allergy status to other antibiotic agents status: Secondary | ICD-10-CM | POA: Insufficient documentation

## 2016-05-17 DIAGNOSIS — K219 Gastro-esophageal reflux disease without esophagitis: Secondary | ICD-10-CM | POA: Diagnosis not present

## 2016-05-17 DIAGNOSIS — N529 Male erectile dysfunction, unspecified: Secondary | ICD-10-CM

## 2016-05-17 DIAGNOSIS — J42 Unspecified chronic bronchitis: Secondary | ICD-10-CM | POA: Diagnosis not present

## 2016-05-17 DIAGNOSIS — K861 Other chronic pancreatitis: Secondary | ICD-10-CM | POA: Insufficient documentation

## 2016-05-17 DIAGNOSIS — Z91013 Allergy to seafood: Secondary | ICD-10-CM | POA: Insufficient documentation

## 2016-05-17 DIAGNOSIS — M109 Gout, unspecified: Secondary | ICD-10-CM | POA: Insufficient documentation

## 2016-05-17 DIAGNOSIS — I10 Essential (primary) hypertension: Secondary | ICD-10-CM | POA: Diagnosis present

## 2016-05-17 MED ORDER — TADALAFIL 20 MG PO TABS: 10.0000 mg | ORAL_TABLET | ORAL | 3 refills | Status: DC | PRN

## 2016-05-17 MED ORDER — ALBUTEROL SULFATE HFA 108 (90 BASE) MCG/ACT IN AERS: INHALATION_SPRAY | RESPIRATORY_TRACT | 0 refills | Status: DC

## 2016-05-17 MED ORDER — PANCRELIPASE (LIP-PROT-AMYL) 24000-76000 UNITS PO CPEP: 24000.0000 [IU] | ORAL_CAPSULE | Freq: Every day | ORAL | 3 refills | Status: DC

## 2016-05-17 MED ORDER — HYDROCHLOROTHIAZIDE 25 MG PO TABS: 25.0000 mg | ORAL_TABLET | Freq: Every day | ORAL | 3 refills | Status: DC

## 2016-05-17 MED ORDER — MONTELUKAST SODIUM 10 MG PO TABS: 10.0000 mg | ORAL_TABLET | Freq: Every day | ORAL | 3 refills | Status: DC

## 2016-05-17 MED ORDER — TERBINAFINE HCL 1 % EX CREA: 1.0000 "application " | TOPICAL_CREAM | Freq: Two times a day (BID) | CUTANEOUS | 3 refills | Status: DC

## 2016-05-17 MED ORDER — PANTOPRAZOLE SODIUM 40 MG PO TBEC: 40.0000 mg | DELAYED_RELEASE_TABLET | Freq: Every day | ORAL | 3 refills | Status: DC

## 2016-05-17 MED ORDER — FLUTICASONE-SALMETEROL 500-50 MCG/DOSE IN AEPB: 1.0000 | INHALATION_SPRAY | Freq: Two times a day (BID) | RESPIRATORY_TRACT | 3 refills | Status: DC

## 2016-05-17 MED ORDER — FLUTICASONE PROPIONATE 50 MCG/ACT NA SUSP: NASAL | 3 refills | Status: DC

## 2016-05-17 NOTE — Patient Instructions (Signed)
DASH Eating Plan DASH stands for "Dietary Approaches to Stop Hypertension." The DASH eating plan is a healthy eating plan that has been shown to reduce high blood pressure (hypertension). Additional health benefits may include reducing the risk of type 2 diabetes mellitus, heart disease, and stroke. The DASH eating plan may also help with weight loss. WHAT DO I NEED TO KNOW ABOUT THE DASH EATING PLAN? For the DASH eating plan, you will follow these general guidelines:  Choose foods with a percent daily value for sodium of less than 5% (as listed on the food label).  Use salt-free seasonings or herbs instead of table salt or sea salt.  Check with your health care provider or pharmacist before using salt substitutes.  Eat lower-sodium products, often labeled as "lower sodium" or "no salt added."  Eat fresh foods.  Eat more vegetables, fruits, and low-fat dairy products.  Choose whole grains. Look for the word "whole" as the first word in the ingredient list.  Choose fish and skinless chicken or turkey more often than red meat. Limit fish, poultry, and meat to 6 oz (170 g) each day.  Limit sweets, desserts, sugars, and sugary drinks.  Choose heart-healthy fats.  Limit cheese to 1 oz (28 g) per day.  Eat more home-cooked food and less restaurant, buffet, and fast food.  Limit fried foods.  Cook foods using methods other than frying.  Limit canned vegetables. If you do use them, rinse them well to decrease the sodium.  When eating at a restaurant, ask that your food be prepared with less salt, or no salt if possible. WHAT FOODS CAN I EAT? Seek help from a dietitian for individual calorie needs. Grains Whole grain or whole wheat bread. Brown rice. Whole grain or whole wheat pasta. Quinoa, bulgur, and whole grain cereals. Low-sodium cereals. Corn or whole wheat flour tortillas. Whole grain cornbread. Whole grain crackers. Low-sodium crackers. Vegetables Fresh or frozen vegetables  (raw, steamed, roasted, or grilled). Low-sodium or reduced-sodium tomato and vegetable juices. Low-sodium or reduced-sodium tomato sauce and paste. Low-sodium or reduced-sodium canned vegetables.  Fruits All fresh, canned (in natural juice), or frozen fruits. Meat and Other Protein Products Ground beef (85% or leaner), grass-fed beef, or beef trimmed of fat. Skinless chicken or turkey. Ground chicken or turkey. Pork trimmed of fat. All fish and seafood. Eggs. Dried beans, peas, or lentils. Unsalted nuts and seeds. Unsalted canned beans. Dairy Low-fat dairy products, such as skim or 1% milk, 2% or reduced-fat cheeses, low-fat ricotta or cottage cheese, or plain low-fat yogurt. Low-sodium or reduced-sodium cheeses. Fats and Oils Tub margarines without trans fats. Light or reduced-fat mayonnaise and salad dressings (reduced sodium). Avocado. Safflower, olive, or canola oils. Natural peanut or almond butter. Other Unsalted popcorn and pretzels. The items listed above may not be a complete list of recommended foods or beverages. Contact your dietitian for more options. WHAT FOODS ARE NOT RECOMMENDED? Grains White bread. White pasta. White rice. Refined cornbread. Bagels and croissants. Crackers that contain trans fat. Vegetables Creamed or fried vegetables. Vegetables in a cheese sauce. Regular canned vegetables. Regular canned tomato sauce and paste. Regular tomato and vegetable juices. Fruits Dried fruits. Canned fruit in light or heavy syrup. Fruit juice. Meat and Other Protein Products Fatty cuts of meat. Ribs, chicken wings, bacon, sausage, bologna, salami, chitterlings, fatback, hot dogs, bratwurst, and packaged luncheon meats. Salted nuts and seeds. Canned beans with salt. Dairy Whole or 2% milk, cream, half-and-half, and cream cheese. Whole-fat or sweetened yogurt. Full-fat   cheeses or blue cheese. Nondairy creamers and whipped toppings. Processed cheese, cheese spreads, or cheese  curds. Condiments Onion and garlic salt, seasoned salt, table salt, and sea salt. Canned and packaged gravies. Worcestershire sauce. Tartar sauce. Barbecue sauce. Teriyaki sauce. Soy sauce, including reduced sodium. Steak sauce. Fish sauce. Oyster sauce. Cocktail sauce. Horseradish. Ketchup and mustard. Meat flavorings and tenderizers. Bouillon cubes. Hot sauce. Tabasco sauce. Marinades. Taco seasonings. Relishes. Fats and Oils Butter, stick margarine, lard, shortening, ghee, and bacon fat. Coconut, palm kernel, or palm oils. Regular salad dressings. Other Pickles and olives. Salted popcorn and pretzels. The items listed above may not be a complete list of foods and beverages to avoid. Contact your dietitian for more information. WHERE CAN I FIND MORE INFORMATION? National Heart, Lung, and [employer] Institute: www.nhlbi.nih.gov/health/health-topics/topics/dash/   This information is not intended to replace advice given to you by your health care provider. Make sure you discuss any questions you have with your health care provider.   Document Released: 07/26/2011 Document Revised: 08/27/2014 Document Reviewed: 06/10/2013 Elsevier Interactive Patient Education 2016 Else[employer] Inc[employer] Hypertension Hypertension, commonly called high blood pressure, is when the force of blood pumping through your arteries is too strong. Your arteries are the blood vessels that carry blood from your heart throughout your body. A blood pressure reading consists of a higher number over a lower number, such as 110/72. The higher number (systolic) is the pressure inside your arteries when your heart pumps. The lower number (diastolic) is the pressure inside your arteries when your heart relaxes. Ideally you want your blood pressure below 120/80. Hypertension forces your heart to work harder to pump blood. Your arteries may become narrow or stiff. Having untreated or uncontrolled hypertension can cause heart attack, stroke, kidney  disease, and other problems. RISK FACTORS Some risk factors for high blood pressure are controllable. Others are not.  Risk factors you cannot control include:   Race. You may be at higher risk if you are African American.  Age. Risk increases with age.  Gender. Men are at higher risk than women before age 45 years. After age 65, women are at higher risk than men. Risk factors you can control include:  Not getting enough exercise or physical activity.  Being overweight.  Getting too much fat, sugar, calories, or salt in your diet.  Drinking too much alcohol. SIGNS AND SYMPTOMS Hypertension does not usually cause signs or symptoms. Extremely high blood pressure (hypertensive crisis) may cause headache, anxiety, shortness of breath, and nosebleed. DIAGNOSIS To check if you have hypertension, your health care provider will measure your blood pressure while you are seated, with your arm held at the level of your heart. It should be measured at least twice using the same arm. Certain conditions can cause a difference in blood pressure between your right and left arms. A blood pressure reading that is higher than normal on one occasion does not mean that you need treatment. If it is not clear whether you have high blood pressure, you may be asked to return on a different day to have your blood pressure checked again. Or, you may be asked to monitor your blood pressure at home for 1 or more weeks. TREATMENT Treating high blood pressure includes making lifestyle changes and possibly taking medicine. Living a healthy lifestyle can help lower high blood pressure. You may need to change some of your habits. Lifestyle changes may include:  Following the DASH diet. This diet is high in fruits, vegetables, and whole grains.   It is low in salt, red meat, and added sugars.  Keep your sodium intake below 2,300 mg per day.  Getting at least 30-45 minutes of aerobic exercise at least 4 times per  week.  Losing weight if necessary.  Not smoking.  Limiting alcoholic beverages.  Learning ways to reduce stress. Your health care provider may prescribe medicine if lifestyle changes are not enough to get your blood pressure under control, and if one of the following is true:  You are 68-77 years of age and your systolic blood pressure is above 140.  You are 67 years of age or older, and your systolic blood pressure is above 150.  Your diastolic blood pressure is above 90.  You have diabetes, and your systolic blood pressure is over [id number] or your diastolic blood pressure is over 90.  You have kidney disease and your blood pressure is above 140/90.  You have heart disease and your blood pressure is above 140/90. Your personal target blood pressure may vary depending on your medical conditions, your age, and other factors. HOME CARE INSTRUCTIONS  Have your blood pressure rechecked as directed by your health care provider.   Take medicines only as directed by your health care provider. Follow the directions carefully. Blood pressure medicines must be taken as prescribed. The medicine does not work as well when you skip doses. Skipping doses also puts you at risk for problems.  Do not smoke.   Monitor your blood pressure at home as directed by your health care provider. SEEK MEDICAL CARE IF:   You think you are having a reaction to medicines taken.  You have recurrent headaches or feel dizzy.  You have swelling in your ankles.  You have trouble with your vision. SEEK IMMEDIATE MEDICAL CARE IF:  You develop a severe headache or confusion.  You have unusual weakness, numbness, or feel faint.  You have severe chest or abdominal pain.  You vomit repeatedly.  You have trouble breathing. MAKE SURE YOU:   Understand these instructions.  Will watch your condition.  Will get help right away if you are not doing well or get worse.   This information is not intended to  replace advice given to you by your health care provider. Make sure you discuss any questions you have with your health care provider.   Document Released: 08/06/2005 Document Revised: 12/21/2014 Document Reviewed: 05/29/2013 Elsevier Interactive Patient Education 2016 [place] Athlete's foot (tinea pedis) is a fungal infection of the skin on the feet. It often occurs on the skin between the toes or underneath the toes. It can also occur on the soles of the feet. Athlete's foot is more likely to occur in hot, humid weather. Not washing your feet or changing your socks often enough can contribute to athlete's foot. The infection can spread from person to person (contagious). CAUSES Athlete's foot is caused by a fungus. This fungus thrives in warm, moist places. Most people get athlete's foot by sharing shower stalls, towels, and wet floors with an infected person. People with weakened immune systems, including those with diabetes, may be more likely to get athlete's foot. SYMPTOMS   Itchy areas between the toes or on the soles of the feet.  White, flaky, or scaly areas between the toes or on the soles of the feet.  Tiny, intensely itchy blisters between the toes or on the soles of the feet.  Tiny cuts on the skin. These cuts can develop a bacterial infection.  Thick or discolored toenails. DIAGNOSIS  Your caregiver can usually tell what the problem is by doing a physical exam. Your caregiver may also take a skin sample from the rash area. The skin sample may be examined under a microscope, or it may be tested to see if fungus will grow in the sample. A sample may also be taken from your toenail for testing. TREATMENT  Over-the-counter and prescription medicines can be used to kill the fungus. These medicines are available as powders or creams. Your caregiver can suggest medicines for you. Fungal infections respond slowly to treatment. You may need to continue using your  medicine for several weeks. PREVENTION   Do not share towels.  Wear sandals in wet areas, such as shared locker rooms and shared showers.  Keep your feet dry. Wear shoes that allow air to circulate. Wear cotton or wool socks. HOME CARE INSTRUCTIONS   Take medicines as directed by your caregiver. Do not use steroid creams on athlete's foot.  Keep your feet clean and cool. Wash your feet daily and dry them thoroughly, especially between your toes.  Change your socks every day. Wear cotton or wool socks. In hot climates, you may need to change your socks 2 to 3 times per day.  Wear sandals or canvas tennis shoes with good air circulation.  If you have blisters, soak your feet in Burow's solution or Epsom salts for 20 to 30 minutes, 2 times a day to dry out the blisters. Make sure you dry your feet thoroughly afterward. SEEK MEDICAL CARE IF:   You have a fever.  You have swelling, soreness, warmth, or redness in your foot.  You are not getting better after 7 days of treatment.  You are not completely cured after 30 days.  You have any problems caused by your medicines. MAKE SURE YOU:   Understand these instructions.  Will watch your condition.  Will get help right away if you are not doing well or get worse.   This information is not intended to replace advice given to you by your health care provider. Make sure you discuss any questions you have with your health care provider.   Document Released: 08/03/2000 Document Revised: 10/29/2011 Document Reviewed: 02/07/2015 Elsevier Interactive Patient Education [employer].

## 2016-05-17 NOTE — Progress Notes (Signed)
Stephen Whitaker, is a 60 y.o. male  [id number]  [id number]  DOB - December 16, 1955  Chief Complaint  Patient presents with  . Hypertension  . Foot Pain        Subjective:   Stephen Whitaker is a 60 y.o. male here today for a follow up visit. Patient has No headache, No chest pain, No abdominal pain - No Nausea, No new weakness tingling or numbness, No Cough - SOB.  No problems updated.  ALLERGIES: Allergies  Allergen Reactions  . Shellfish Allergy Swelling  . Ciprofloxacin Rash and Other (See Comments)    Syncope epsiode    PAST MEDICAL HISTORY: Past Medical History:  Diagnosis Date  . Arthritis   . Chronic bronchitis ([place])   . GERD (gastroesophageal reflux disease)   . Gout   . Herpes genitalia   . Hypertension   . Pancreatitis, acute   . Pneumonia 2016    MEDICATIONS AT HOME: Prior to Admission medications   Medication Sig Start Date End Date Taking? Authorizing Provider  albuterol (VENTOLIN HFA) 108 (90 Base) MCG/ACT inhaler INHALE TWO PUFFS BY MOUTH EVERY 4 HOURS AS NEEDED FOR WHEEZING OR SHORTNESS OF BREATH 05/17/16  Yes Tresa Garter, MD  allopurinol (ZYLOPRIM) 100 MG tablet Take 300 mg by mouth daily.  07/19/14  Yes Historical Provider, MD  Bioflavonoid Products (ESTER C PO) Take 1 tablet by mouth daily.   Yes Historical Provider, MD  clobetasol ointment (TEMOVATE) [id number] % Apply 1 application topically 2 (two) times daily. Patient taking differently: Apply 1 application topically 2 (two) times daily as needed (for skin irritation).  11/05/14  Yes Tresa Garter, MD  fluticasone (FLONASE) 50 MCG/ACT nasal spray USE ONE SPRAY(S) IN EACH NOSTRIL ONCE DAILY 05/17/16  Yes Tresa Garter, MD  Fluticasone-Salmeterol (ADVAIR) 500-50 MCG/DOSE AEPB Inhale 1 puff into the lungs 2 (two) times daily. 05/17/16  Yes Tresa Garter, MD  hydrochlorothiazide (HYDRODIURIL) 25 MG tablet Take 1 tablet (25 mg total) by mouth daily. 05/17/16  Yes Tresa Garter, MD  ibuprofen (ADVIL,MOTRIN) 800 MG tablet Take 800 mg by mouth every 8 (eight) hours as needed for moderate pain.    Yes Historical Provider, MD  montelukast (SINGULAIR) 10 MG tablet Take 1 tablet (10 mg total) by mouth at bedtime. 05/17/16  Yes Tresa Garter, MD  Multiple Vitamin (MULTIVITAMIN WITH MINERALS) TABS tablet Take 1 tablet by mouth daily. 11/24/15  Yes Tresa Garter, MD  Naphazoline-Pheniramine (EYE ALLERGY RELIEF OP) Place 1 drop into both eyes daily as needed (for allergy eyes).   Yes Historical Provider, MD  Neomy-Bacit-Polymyx-Pramoxine (TRIPLE ANTIBIOTIC+PAIN RELIEF EX) Apply 1 application topically daily as needed (apply to affected toe).   Yes Historical Provider, MD  OVER THE COUNTER MEDICATION Take 1 capsule by mouth 3 (three) times daily with meals. Blue/green algae   Yes Historical Provider, MD  oxyCODONE-acetaminophen (ROXICET) 5-325 MG tablet Take 1 tablet by mouth every 4 (four) hours as needed for severe pain. 11/17/15  Yes [name], MD  Pancrelipase, Lip-Prot-Amyl, (CREON) 24000-76000 units CPEP Take 1 capsule (24,000 Units total) by mouth daily. 05/17/16  Yes Tresa Garter, MD  pantoprazole (PROTONIX) 40 MG tablet Take 1 tablet (40 mg total) by mouth daily. 05/17/16  Yes Tresa Garter, MD  tadalafil (CIALIS) 20 MG tablet Take 0.5-1 tablets (10-20 mg total) by mouth every other day as needed for erectile dysfunction. 05/17/16   Tresa Garter, MD  terbinafine (LAMISIL AT) 1 %  cream Apply 1 application topically 2 (two) times daily. 05/17/16   Tresa Garter, MD     Objective:   Vitals:   05/17/16 1525  BP: 122/74  Pulse: 83  Resp: 17  Temp: 98.1 F (36.7 C)  TempSrc: Oral  SpO2: 100%  Weight: 190 lb 9.6 oz (86.5 kg)  Height: 6' 2.5" (1.892 m)    Exam General appearance : Awake, alert, not in any distress. Speech Clear. Not toxic looking HEENT: Atraumatic and Normocephalic, pupils equally reactive to light and  accomodation Neck: Supple, no JVD. No cervical lymphadenopathy.  Chest: Good air entry bilaterally, no added sounds  CVS: S1 S2 regular, no murmurs.  Abdomen: Bowel sounds present, Non tender and not distended with no gaurding, rigidity or rebound. Extremities: B/L Lower Ext shows no edema, both legs are warm to touch Neurology: Awake alert, and oriented X 3, CN II-XII intact, Non focal Skin: No Rash  Data Review Lab Results  Component Value Date   HGBA1C 5.6 08/10/2014     Assessment & Plan   1. Other chronic pancreatitis (HCC)  - Pancrelipase, Lip-Prot-Amyl, (CREON) 24000-76000 units CPEP; Take 1 capsule (24,000 Units total) by mouth daily.  Dispense: 180 capsule; Refill: 3  2. Chronic bronchitis, unspecified chronic bronchitis type (HCC)  - albuterol (VENTOLIN HFA) 108 (90 Base) MCG/ACT inhaler; INHALE TWO PUFFS BY MOUTH EVERY 4 HOURS AS NEEDED FOR WHEEZING OR SHORTNESS OF BREATH  Dispense: 18 each; Refill: 0 - montelukast (SINGULAIR) 10 MG tablet; Take 1 tablet (10 mg total) by mouth at bedtime.  Dispense: 30 tablet; Refill: 3 - Fluticasone-Salmeterol (ADVAIR) 500-50 MCG/DOSE AEPB; Inhale 1 puff into the lungs 2 (two) times daily.  Dispense: 60 each; Refill: 3 - fluticasone (FLONASE) 50 MCG/ACT nasal spray; USE ONE SPRAY(S) IN EACH NOSTRIL ONCE DAILY  Dispense: 16 g; Refill: 3  3. Essential hypertension  - hydrochlorothiazide (HYDRODIURIL) 25 MG tablet; Take 1 tablet (25 mg total) by mouth daily.  Dispense: 90 tablet; Refill: 3  4. Gastroesophageal reflux disease without esophagitis  - pantoprazole (PROTONIX) 40 MG tablet; Take 1 tablet (40 mg total) by mouth daily.  Dispense: 30 tablet; Refill: 3  5. Onychomycosis - terbinafine (LAMISIL AT) 1 % cream; Apply 1 application topically 2 (two) times daily.  Dispense: 60 g; Refill: 3  6. Erectile dysfunction, unspecified erectile dysfunction type  - tadalafil (CIALIS) 20 MG tablet; Take 0.5-1 tablets (10-20 mg total) by mouth  every other day as needed for erectile dysfunction.  Dispense: 30 tablet; Refill: 3 Patient have been counseled extensively about nutrition and exercise  Return in about 6 months (around 11/14/2016) for Annual Physical, Follow up HTN, Follow up Pain and comorbidities.  The patient was given clear instructions to go to ER or return to medical center if symptoms don't improve, worsen or new problems develop. The patient verbalized understanding. The patient was told to call to get lab results if they haven't heard anything in the next week.   This note has been created with Surveyor, quantity. Any transcriptional errors are unintentional.    Angelica Chessman, MD, [place], [place]arilyn Cota, [place] and [place], [place]   05/17/2016, 4:01 PM

## 2016-05-17 NOTE — Progress Notes (Signed)
Pt is in for HTN follow up and foot pain  Pt believes he has a boil on his buttocks that comes and goes  Wants Rx for erectile dysfunction

## 2016-05-30 ENCOUNTER — Ambulatory Visit (INDEPENDENT_AMBULATORY_CARE_PROVIDER_SITE_OTHER): Payer: BLUE CROSS/BLUE SHIELD | Admitting: Orthopedic Surgery

## 2016-05-30 DIAGNOSIS — M86271 Subacute osteomyelitis, right ankle and foot: Secondary | ICD-10-CM | POA: Diagnosis not present

## 2016-06-04 ENCOUNTER — Other Ambulatory Visit (HOSPITAL_COMMUNITY): Payer: Self-pay | Admitting: Family

## 2016-06-08 ENCOUNTER — Ambulatory Visit (INDEPENDENT_AMBULATORY_CARE_PROVIDER_SITE_OTHER): Payer: BLUE CROSS/BLUE SHIELD | Admitting: Family

## 2016-06-08 DIAGNOSIS — M25572 Pain in left ankle and joints of left foot: Secondary | ICD-10-CM

## 2016-06-08 DIAGNOSIS — M1009 Idiopathic gout, multiple sites: Secondary | ICD-10-CM | POA: Diagnosis not present

## 2016-06-12 ENCOUNTER — Other Ambulatory Visit: Payer: Self-pay | Admitting: Orthopedic Surgery

## 2016-06-12 ENCOUNTER — Encounter (HOSPITAL_COMMUNITY): Payer: Self-pay | Admitting: *Deleted

## 2016-06-12 NOTE — Anesthesia Preprocedure Evaluation (Addendum)
Anesthesia Evaluation  Patient identified by MRN, date of birth, ID band Patient awake    Reviewed: Allergy & Precautions, NPO status , Patient's Chart, lab work & pertinent test results  History of Anesthesia Complications Negative for: history of anesthetic complications  Airway Mallampati: II  TM Distance: >3 FB Neck ROM: Full    Dental no notable dental hx. (+) Dental Advisory Given   Pulmonary asthma , COPD,    Pulmonary exam normal breath sounds clear to auscultation       Cardiovascular hypertension, Pt. on medications Normal cardiovascular exam Rhythm:Regular Rate:Normal     Neuro/Psych negative neurological ROS  negative psych ROS   GI/Hepatic Neg liver ROS, GERD  Medicated and Controlled,  Endo/Other  negative endocrine ROS  Renal/GU negative Renal ROS  negative genitourinary   Musculoskeletal  (+) Arthritis , Osteoarthritis,    Abdominal   Peds negative pediatric ROS (+)  Hematology negative hematology ROS (+)   Anesthesia Other Findings   Reproductive/Obstetrics negative OB ROS                             Lab Results  Component Value Date   WBC 4.1 11/11/2015   HGB 11.1 (L) 11/11/2015   HCT 35.1 (L) 11/11/2015   MCV 84.8 11/11/2015   PLT 331 11/11/2015   Lab Results  Component Value Date   CREATININE 0.77 11/11/2015   BUN 11 11/11/2015   NA 141 11/11/2015   K 3.0 (L) 11/11/2015   CL 102 11/11/2015   CO2 29 11/11/2015    Anesthesia Physical  Anesthesia Plan  ASA: III  Anesthesia Plan: MAC and Regional   Post-op Pain Management:    Induction: Intravenous  Airway Management Planned: Natural Airway and Simple Face Mask  Additional Equipment:   Intra-op Plan:   Post-operative Plan:   Informed Consent: I have reviewed the patients History and Physical, chart, labs and discussed the procedure including the risks, benefits and alternatives for the  proposed anesthesia with the patient or authorized representative who has indicated his/her understanding and acceptance.   Dental advisory given  Plan Discussed with: CRNA  Anesthesia Plan Comments:        Anesthesia Quick Evaluation

## 2016-06-13 ENCOUNTER — Ambulatory Visit (HOSPITAL_COMMUNITY): Payer: BLUE CROSS/BLUE SHIELD | Admitting: Anesthesiology

## 2016-06-13 ENCOUNTER — Ambulatory Visit (HOSPITAL_COMMUNITY)
Admission: RE | Admit: 2016-06-13 | Discharge: 2016-06-14 | Disposition: A | Discharge status: Home / Self Care | Payer: BLUE CROSS/BLUE SHIELD | Source: Ambulatory Visit | Attending: Orthopedic Surgery | Admitting: Orthopedic Surgery

## 2016-06-13 ENCOUNTER — Encounter (HOSPITAL_COMMUNITY): Payer: Self-pay | Admitting: Urology

## 2016-06-13 ENCOUNTER — Encounter (HOSPITAL_COMMUNITY)
Admission: RE | Disposition: A | Discharge status: Home / Self Care | Payer: Self-pay | Source: Ambulatory Visit | Attending: Orthopedic Surgery

## 2016-06-13 DIAGNOSIS — S98131A Complete traumatic amputation of one right lesser toe, initial encounter: Secondary | ICD-10-CM

## 2016-06-13 DIAGNOSIS — Z89422 Acquired absence of other left toe(s): Secondary | ICD-10-CM | POA: Insufficient documentation

## 2016-06-13 DIAGNOSIS — M868X7 Other osteomyelitis, ankle and foot: Secondary | ICD-10-CM | POA: Diagnosis present

## 2016-06-13 DIAGNOSIS — I10 Essential (primary) hypertension: Secondary | ICD-10-CM | POA: Insufficient documentation

## 2016-06-13 DIAGNOSIS — Z881 Allergy status to other antibiotic agents status: Secondary | ICD-10-CM | POA: Diagnosis not present

## 2016-06-13 DIAGNOSIS — M109 Gout, unspecified: Secondary | ICD-10-CM | POA: Diagnosis not present

## 2016-06-13 DIAGNOSIS — G629 Polyneuropathy, unspecified: Secondary | ICD-10-CM | POA: Diagnosis not present

## 2016-06-13 DIAGNOSIS — Z91013 Allergy to seafood: Secondary | ICD-10-CM | POA: Diagnosis not present

## 2016-06-13 DIAGNOSIS — Z89412 Acquired absence of left great toe: Secondary | ICD-10-CM | POA: Insufficient documentation

## 2016-06-13 DIAGNOSIS — Z882 Allergy status to sulfonamides status: Secondary | ICD-10-CM | POA: Insufficient documentation

## 2016-06-13 DIAGNOSIS — K219 Gastro-esophageal reflux disease without esophagitis: Secondary | ICD-10-CM | POA: Diagnosis not present

## 2016-06-13 DIAGNOSIS — M1990 Unspecified osteoarthritis, unspecified site: Secondary | ICD-10-CM | POA: Diagnosis not present

## 2016-06-13 DIAGNOSIS — A6000 Herpesviral infection of urogenital system, unspecified: Secondary | ICD-10-CM | POA: Insufficient documentation

## 2016-06-13 DIAGNOSIS — J449 Chronic obstructive pulmonary disease, unspecified: Secondary | ICD-10-CM | POA: Diagnosis not present

## 2016-06-13 DIAGNOSIS — M869 Osteomyelitis, unspecified: Secondary | ICD-10-CM | POA: Diagnosis not present

## 2016-06-13 HISTORY — DX: Dyspnea, unspecified: R06.00

## 2016-06-13 HISTORY — PX: TOE AMPUTATION: SHX809

## 2016-06-13 HISTORY — DX: Chronic obstructive pulmonary disease, unspecified: J44.9

## 2016-06-13 HISTORY — PX: AMPUTATION: SHX166

## 2016-06-13 LAB — CBC
HEMATOCRIT: 38.1 % — AB (ref 39.0–52.0)
Hemoglobin: 12.1 g/dL — ABNORMAL LOW (ref 13.0–17.0)
MCH: 27.4 pg (ref 26.0–34.0)
MCHC: 31.8 g/dL (ref 30.0–36.0)
MCV: 86.2 fL (ref 78.0–100.0)
PLATELETS: 234 10*3/uL (ref 150–400)
RBC: 4.42 MIL/uL (ref 4.22–5.81)
RDW: 13.8 % (ref 11.5–15.5)
WBC: 2.4 10*3/uL — AB (ref 4.0–10.5)

## 2016-06-13 LAB — BASIC METABOLIC PANEL
Anion gap: 8 (ref 5–15)
BUN: 14 mg/dL (ref 6–20)
CHLORIDE: 106 mmol/L (ref 101–111)
CO2: 27 mmol/L (ref 22–32)
CREATININE: 0.89 mg/dL (ref 0.61–1.24)
Calcium: 9.1 mg/dL (ref 8.9–10.3)
Glucose, Bld: 92 mg/dL (ref 65–99)
POTASSIUM: 3 mmol/L — AB (ref 3.5–5.1)
SODIUM: 141 mmol/L (ref 135–145)

## 2016-06-13 SURGERY — AMPUTATION DIGIT
Anesthesia: Monitor Anesthesia Care | Laterality: Right

## 2016-06-13 MED ORDER — ASPIRIN EC 325 MG PO TBEC
325.0000 mg | DELAYED_RELEASE_TABLET | Freq: Every day | ORAL | Status: DC
  Administered 2016-06-13 – 2016-06-14 (×2): 325 mg via ORAL
  Filled 2016-06-13 (×2): qty 1

## 2016-06-13 MED ORDER — FENTANYL CITRATE (PF) 100 MCG/2ML IJ SOLN
INTRAMUSCULAR | Status: DC | PRN
  Administered 2016-06-13 (×2): 50 ug via INTRAVENOUS

## 2016-06-13 MED ORDER — MEPIVACAINE HCL 1.5 % IJ SOLN
INTRAMUSCULAR | Status: DC | PRN
  Administered 2016-06-13: 15 mL via EPIDURAL

## 2016-06-13 MED ORDER — SODIUM CHLORIDE 0.9 % IV SOLN
INTRAVENOUS | Status: DC
  Administered 2016-06-13: 12:00:00 via INTRAVENOUS

## 2016-06-13 MED ORDER — OXYCODONE-ACETAMINOPHEN 5-325 MG PO TABS: 1.0000 | ORAL_TABLET | ORAL | 0 refills | Status: DC | PRN

## 2016-06-13 MED ORDER — METOCLOPRAMIDE HCL 5 MG PO TABS: 5.0000 mg | ORAL_TABLET | Freq: Three times a day (TID) | ORAL | Status: DC | PRN

## 2016-06-13 MED ORDER — CEFAZOLIN IN D5W 1 GM/50ML IV SOLN
1.0000 g | Freq: Four times a day (QID) | INTRAVENOUS | Status: AC
  Administered 2016-06-13 (×3): 1 g via INTRAVENOUS
  Filled 2016-06-13 (×3): qty 50

## 2016-06-13 MED ORDER — METHOCARBAMOL 1000 MG/10ML IJ SOLN
500.0000 mg | Freq: Four times a day (QID) | INTRAVENOUS | Status: DC | PRN
  Filled 2016-06-13: qty 5

## 2016-06-13 MED ORDER — ONDANSETRON HCL 4 MG PO TABS
4.0000 mg | ORAL_TABLET | Freq: Four times a day (QID) | ORAL | Status: DC | PRN
  Administered 2016-06-14: 4 mg via ORAL
  Filled 2016-06-13: qty 1

## 2016-06-13 MED ORDER — ACETAMINOPHEN 325 MG PO TABS
650.0000 mg | ORAL_TABLET | Freq: Four times a day (QID) | ORAL | Status: DC | PRN
  Administered 2016-06-13: 650 mg via ORAL
  Filled 2016-06-13: qty 2

## 2016-06-13 MED ORDER — ALLOPURINOL 300 MG PO TABS
300.0000 mg | ORAL_TABLET | Freq: Every day | ORAL | Status: DC
  Administered 2016-06-14: 300 mg via ORAL
  Filled 2016-06-13: qty 1

## 2016-06-13 MED ORDER — PROPOFOL 10 MG/ML IV BOLUS
INTRAVENOUS | Status: DC | PRN
  Administered 2016-06-13 (×2): 50 mg via INTRAVENOUS
  Administered 2016-06-13: 20 mg via INTRAVENOUS

## 2016-06-13 MED ORDER — LIDOCAINE HCL (CARDIAC) 20 MG/ML IV SOLN
INTRAVENOUS | Status: DC | PRN
  Administered 2016-06-13: 100 mg via INTRAVENOUS

## 2016-06-13 MED ORDER — PROMETHAZINE HCL 25 MG/ML IJ SOLN: 6.2500 mg | INTRAMUSCULAR | Status: DC | PRN

## 2016-06-13 MED ORDER — CEFAZOLIN SODIUM-DEXTROSE 2-4 GM/100ML-% IV SOLN
2.0000 g | INTRAVENOUS | Status: AC
  Administered 2016-06-13: 2 g via INTRAVENOUS
  Filled 2016-06-13: qty 100

## 2016-06-13 MED ORDER — CHLORHEXIDINE GLUCONATE 4 % EX LIQD: 60.0000 mL | Freq: Once | CUTANEOUS | Status: DC

## 2016-06-13 MED ORDER — METHOCARBAMOL 500 MG PO TABS
500.0000 mg | ORAL_TABLET | Freq: Four times a day (QID) | ORAL | Status: DC | PRN
  Administered 2016-06-13 – 2016-06-14 (×3): 500 mg via ORAL
  Filled 2016-06-13 (×3): qty 1

## 2016-06-13 MED ORDER — PROPOFOL 10 MG/ML IV BOLUS
INTRAVENOUS | Status: AC
  Filled 2016-06-13: qty 40

## 2016-06-13 MED ORDER — LACTATED RINGERS IV SOLN
INTRAVENOUS | Status: DC | PRN
  Administered 2016-06-13: 08:00:00 via INTRAVENOUS

## 2016-06-13 MED ORDER — HYDROCHLOROTHIAZIDE 25 MG PO TABS
25.0000 mg | ORAL_TABLET | Freq: Every day | ORAL | Status: DC
  Administered 2016-06-13 – 2016-06-14 (×2): 25 mg via ORAL
  Filled 2016-06-13 (×2): qty 1

## 2016-06-13 MED ORDER — MIDAZOLAM HCL 2 MG/2ML IJ SOLN
INTRAMUSCULAR | Status: AC
  Filled 2016-06-13: qty 2

## 2016-06-13 MED ORDER — BUPIVACAINE-EPINEPHRINE (PF) 0.5% -1:200000 IJ SOLN
INTRAMUSCULAR | Status: DC | PRN
  Administered 2016-06-13: 15 mL via PERINEURAL

## 2016-06-13 MED ORDER — OXYCODONE HCL 5 MG PO TABS
5.0000 mg | ORAL_TABLET | ORAL | Status: DC | PRN
  Administered 2016-06-13 – 2016-06-14 (×5): 10 mg via ORAL
  Filled 2016-06-13 (×6): qty 2

## 2016-06-13 MED ORDER — FENTANYL CITRATE (PF) 100 MCG/2ML IJ SOLN
INTRAMUSCULAR | Status: AC
  Filled 2016-06-13: qty 2

## 2016-06-13 MED ORDER — MONTELUKAST SODIUM 10 MG PO TABS
10.0000 mg | ORAL_TABLET | Freq: Every day | ORAL | Status: DC
  Administered 2016-06-13: 10 mg via ORAL
  Filled 2016-06-13: qty 1

## 2016-06-13 MED ORDER — CYCLOBENZAPRINE HCL 5 MG PO TABS
7.5000 mg | ORAL_TABLET | Freq: Four times a day (QID) | ORAL | Status: DC | PRN
  Filled 2016-06-13 (×2): qty 1.5

## 2016-06-13 MED ORDER — HYDROMORPHONE HCL 1 MG/ML IJ SOLN: 0.2500 mg | INTRAMUSCULAR | Status: DC | PRN

## 2016-06-13 MED ORDER — ONDANSETRON HCL 4 MG/2ML IJ SOLN
INTRAMUSCULAR | Status: DC | PRN
  Administered 2016-06-13: 4 mg via INTRAVENOUS

## 2016-06-13 MED ORDER — PANTOPRAZOLE SODIUM 40 MG PO TBEC
40.0000 mg | DELAYED_RELEASE_TABLET | Freq: Every day | ORAL | Status: DC
  Administered 2016-06-14: 40 mg via ORAL
  Filled 2016-06-13: qty 1

## 2016-06-13 MED ORDER — 0.9 % SODIUM CHLORIDE (POUR BTL) OPTIME
TOPICAL | Status: DC | PRN
  Administered 2016-06-13: 1000 mL

## 2016-06-13 MED ORDER — MOMETASONE FURO-FORMOTEROL FUM 200-5 MCG/ACT IN AERO
2.0000 | INHALATION_SPRAY | Freq: Two times a day (BID) | RESPIRATORY_TRACT | Status: DC
  Administered 2016-06-13 – 2016-06-14 (×2): 2 via RESPIRATORY_TRACT
  Filled 2016-06-13: qty 8.8

## 2016-06-13 MED ORDER — ONDANSETRON HCL 4 MG/2ML IJ SOLN: 4.0000 mg | Freq: Four times a day (QID) | INTRAMUSCULAR | Status: DC | PRN

## 2016-06-13 MED ORDER — METOCLOPRAMIDE HCL 5 MG/ML IJ SOLN: 5.0000 mg | Freq: Three times a day (TID) | INTRAMUSCULAR | Status: DC | PRN

## 2016-06-13 MED ORDER — MIDAZOLAM HCL 2 MG/2ML IJ SOLN
INTRAMUSCULAR | Status: DC | PRN
  Administered 2016-06-13 (×2): 1 mg via INTRAVENOUS

## 2016-06-13 MED ORDER — ALBUTEROL SULFATE (2.5 MG/3ML) 0.083% IN NEBU: 2.5000 mg | INHALATION_SOLUTION | RESPIRATORY_TRACT | Status: DC | PRN

## 2016-06-13 MED ORDER — ACETAMINOPHEN 650 MG RE SUPP
650.0000 mg | Freq: Four times a day (QID) | RECTAL | Status: DC | PRN
Start: 2016-06-13 — End: 2016-06-14

## 2016-06-13 SURGICAL SUPPLY — 43 items
BLADE SURG 21 STRL SS (BLADE) ×2 IMPLANT
BNDG COHESIVE 4X5 TAN STRL (GAUZE/BANDAGES/DRESSINGS) ×2 IMPLANT
BNDG ESMARK 4X9 LF (GAUZE/BANDAGES/DRESSINGS) ×2 IMPLANT
BNDG GAUZE ELAST 4 BULKY (GAUZE/BANDAGES/DRESSINGS) ×2 IMPLANT
CHLORAPREP W/TINT 26ML (MISCELLANEOUS) ×2 IMPLANT
COVER SURGICAL LIGHT HANDLE (MISCELLANEOUS) ×2 IMPLANT
DRAPE U-SHAPE 47X51 STRL (DRAPES) ×2 IMPLANT
DRSG EMULSION OIL 3X3 NADH (GAUZE/BANDAGES/DRESSINGS) ×2 IMPLANT
DRSG PAD ABDOMINAL 8X10 ST (GAUZE/BANDAGES/DRESSINGS) ×2 IMPLANT
DURAPREP 26ML APPLICATOR (WOUND CARE) IMPLANT
ELECT REM PT RETURN 9FT ADLT (ELECTROSURGICAL) ×2
ELECTRODE REM PT RTRN 9FT ADLT (ELECTROSURGICAL) ×1 IMPLANT
GAUZE SPONGE 4X4 12PLY STRL (GAUZE/BANDAGES/DRESSINGS) ×2 IMPLANT
GLOVE BIOGEL PI IND STRL 6.5 (GLOVE) ×1 IMPLANT
GLOVE BIOGEL PI IND STRL 7.0 (GLOVE) ×1 IMPLANT
GLOVE BIOGEL PI IND STRL 7.5 (GLOVE) ×1 IMPLANT
GLOVE BIOGEL PI IND STRL 9 (GLOVE) ×1 IMPLANT
GLOVE BIOGEL PI INDICATOR 6.5 (GLOVE) ×1
GLOVE BIOGEL PI INDICATOR 7.0 (GLOVE) ×1
GLOVE BIOGEL PI INDICATOR 7.5 (GLOVE) ×1
GLOVE BIOGEL PI INDICATOR 9 (GLOVE) ×1
GLOVE SURG ORTHO 9.0 STRL STRW (GLOVE) ×2 IMPLANT
GLOVE SURG SS PI 6.0 STRL IVOR (GLOVE) ×2 IMPLANT
GLOVE SURG SS PI 6.5 STRL IVOR (GLOVE) ×2 IMPLANT
GOWN STRL REUS W/ TWL XL LVL3 (GOWN DISPOSABLE) ×2 IMPLANT
GOWN STRL REUS W/TWL XL LVL3 (GOWN DISPOSABLE) ×2
KIT BASIN OR (CUSTOM PROCEDURE TRAY) ×2 IMPLANT
KIT ROOM TURNOVER OR (KITS) ×2 IMPLANT
MANIFOLD NEPTUNE II (INSTRUMENTS) ×2 IMPLANT
NEEDLE 22X1 1/2 (OR ONLY) (NEEDLE) IMPLANT
NS IRRIG 1000ML POUR BTL (IV SOLUTION) ×2 IMPLANT
PACK ORTHO EXTREMITY (CUSTOM PROCEDURE TRAY) ×2 IMPLANT
PAD ABD 8X10 STRL (GAUZE/BANDAGES/DRESSINGS) ×2 IMPLANT
PAD ARMBOARD 7.5X6 YLW CONV (MISCELLANEOUS) ×4 IMPLANT
SPONGE GAUZE 4X4 12PLY STER LF (GAUZE/BANDAGES/DRESSINGS) ×2 IMPLANT
SUCTION FRAZIER HANDLE 10FR (MISCELLANEOUS)
SUCTION TUBE FRAZIER 10FR DISP (MISCELLANEOUS) IMPLANT
SUT ETHILON 2 0 PSLX (SUTURE) ×2 IMPLANT
SYR CONTROL 10ML LL (SYRINGE) IMPLANT
TOWEL OR 17X24 6PK STRL BLUE (TOWEL DISPOSABLE) ×2 IMPLANT
TOWEL OR 17X26 10 PK STRL BLUE (TOWEL DISPOSABLE) ×2 IMPLANT
TUBE CONNECTING 12X1/4 (SUCTIONS) ×2 IMPLANT
YANKAUER SUCT BULB TIP NO VENT (SUCTIONS) ×2 IMPLANT

## 2016-06-13 NOTE — H&P (Signed)
Stephen Whitaker is an 60 y.o. male.   Chief Complaint: Osteomyelitis right foot second toe HPI: Patient is a 60 year old gentleman with insensate neuropathy status post great toe amputation who presents at this time for amputation of the second toe right foot due to failure of conservative wound care with persistent osteomyelitis.  Past Medical History:  Diagnosis Date  . Arthritis   . Chronic bronchitis ([place])   . COPD (chronic obstructive pulmonary disease) (HCC)    Chronic Bronchitis  . Dyspnea   . GERD (gastroesophageal reflux disease)   . Gout   . Herpes genitalia   . Hypertension   . Pancreatitis, acute   . Pneumonia 2016    Past Surgical History:  Procedure Laterality Date  . AMPUTATION Left 07/14/2014   Procedure: 2nd Toe Amputation;  Surgeon: [name], MD;  Location: [place];  Service: Orthopedics;  Laterality: Left;  . AMPUTATION Left 11/16/2015   Procedure: Left Great Toe Amputation at Metatarsophalangeal Joint;  Surgeon: [name], MD;  Location: [place];  Service: Orthopedics;  Laterality: Left;  . TOE AMPUTATION Left 11/16/2015   left great toe   . TONSILLECTOMY      Family History  Problem Relation Age of Onset  . Adopted: Yes   Social History:  reports that he has never smoked. He has never used smokeless tobacco. He reports that he does not drink alcohol or use drugs.  Allergies:  Allergies  Allergen Reactions  . Shellfish Allergy Swelling  . Ciprofloxacin Rash and Other (See Comments)    Syncope epsiode  . Sulfa Antibiotics Other (See Comments)    Dizziness, syncope episode     No prescriptions prior to admission.    No results found for this or any previous visit (from the past 48 hour(s)). No results found.  ROS  There were no vitals taken for this visit. Physical Exam  Patient is alert oriented no adenopathy well-dressed normal affect normal respiratory effort.  Examination right foot he has cellulitis swelling ulceration right foot  second toe. Radiographs show osteomyelitis. Assessment/Plan Assessment: Osteomyelitis right foot second toe.  Plan: We will plan for amputation right foot second toe risk and benefits were discussed including risk of the wound not healing. Patient states he understands wishes to proceed at this time.  [name], MD 06/13/2016, 6:27 AM

## 2016-06-13 NOTE — Anesthesia Postprocedure Evaluation (Signed)
Anesthesia Post Note  Patient: Stephen Whitaker  Procedure(s) Performed: Procedure(s) (LRB): RIGHT 2nd Toe Amputation (Right)  Patient location during evaluation: PACU Anesthesia Type: MAC and Regional Level of consciousness: awake and alert Pain management: pain level controlled Vital Signs Assessment: post-procedure vital signs reviewed and stable Respiratory status: spontaneous breathing, nonlabored ventilation, respiratory function stable and patient connected to nasal cannula oxygen Cardiovascular status: stable and blood pressure returned to baseline Anesthetic complications: no    Last Vitals:  Vitals:   06/13/16 1000 06/13/16 1030  BP:    Pulse: 72 68  Resp: 14 17  Temp:  36.3 C    Last Pain:  Vitals:   06/13/16 0906  TempSrc:   PainSc: 0-No pain                 Tiajuana Amass

## 2016-06-13 NOTE — Progress Notes (Signed)
Orthopedic Tech Progress Note Patient Details:  Stephen Whitaker [birth year] [id number] Viewed order from doctor's order list Ortho Devices Type of Ortho Device: Postop shoe/boot, Crutches Ortho Device/Splint Location: rle Ortho Device/Splint Interventions: Application   Stephen Whitaker 06/13/2016, 10:19 AM

## 2016-06-13 NOTE — Transfer of Care (Signed)
Immediate Anesthesia Transfer of Care Note  Patient: Stephen Whitaker  Procedure(s) Performed: Procedure(s): RIGHT 2nd Toe Amputation (Right)  Patient Location: PACU  Anesthesia Type:MAC  Level of Consciousness: awake, alert  and oriented  Airway & Oxygen Therapy: Patient Spontanous Breathing and Patient connected to nasal cannula oxygen  Post-op Assessment: Report given to RN and Post -op Vital signs reviewed and stable  Post vital signs: Reviewed and stable  Last Vitals:  Vitals:   06/13/16 0723 06/13/16 0906  BP: (!) 169/101   Pulse:    Resp:    Temp:  (P) 36.3 C    Last Pain:  Vitals:   06/13/16 0906  TempSrc:   PainSc: (P) 0-No pain         Complications: No apparent anesthesia complications

## 2016-06-13 NOTE — Anesthesia Procedure Notes (Signed)
Anesthesia Regional Block:  Popliteal block  Pre-Anesthetic Checklist: ,, timeout performed, Correct Patient, Correct Site, Correct Laterality, Correct Procedure, Correct Position, site marked, Risks and benefits discussed,  Surgical consent,  Pre-op evaluation,  At surgeon's request and post-op pain management  Laterality: Right  Prep: chloraprep       Needles:  Injection technique: Single-shot  Needle Type: Echogenic Stimulator Needle     Needle Length: 9cm 9 cm Needle Gauge: 21 and 21 G    Additional Needles:  Procedures: ultrasound guided (picture in chart) and nerve stimulator Popliteal block  Nerve Stimulator or Paresthesia:  Response: tibial and peroneal, 0.5 mA,   Additional Responses:   Narrative:  Start time: 06/13/2016 8:05 AM End time: 06/13/2016 8:14 AM Injection made incrementally with aspirations every 5 mL.  Performed by: Personally  Anesthesiologist: Suzette Battiest

## 2016-06-13 NOTE — Progress Notes (Signed)
Notified Dr. Conrad Belknap of BP

## 2016-06-13 NOTE — Op Note (Signed)
06/13/2016  8:59 AM  PATIENT:  Stephen Whitaker    PRE-OPERATIVE DIAGNOSIS:  Osteomyelitis RIGHT 2nd Toe  POST-OPERATIVE DIAGNOSIS:  Same  PROCEDURE:  RIGHT 2nd Toe Amputation  SURGEON:  [name], MD  PHYSICIAN ASSISTANT:None ANESTHESIA:   General  PREOPERATIVE INDICATIONS:  Stephen Whitaker is a  60 y.o. male with a diagnosis of Osteomyelitis RIGHT 2nd Toe who failed conservative measures and elected for surgical management.    The risks benefits and alternatives were discussed with the patient preoperatively including but not limited to the risks of infection, bleeding, nerve injury, cardiopulmonary complications, the need for revision surgery, among others, and the patient was willing to proceed.  OPERATIVE IMPLANTS: None  OPERATIVE FINDINGS: Good petechial bleeding  OPERATIVE PROCEDURE: Patient brought to operating room after undergoing a regional block. After adequate levels anesthesia obtained patient's right lower extremity was prepped using DuraPrep draped into a sterile field a timeout was called. A incision was made just distal to the MTP joint of the right second toe the toe was amputated through the MTP joint. The wound was irrigated with normal saline electrocautery was used for hemostasis there was no abscess no signs of infection at this level. The incision was closed using 2-0 nylon a sterile compressive dressing was applied patient was taken to the PACU in stable condition plan for discharge to home follow-up in the office in 1 week.

## 2016-06-13 NOTE — Care Management Note (Signed)
Case Management Note  Patient Details  Name: Stephen Whitaker MRN: [id number] Date of Birth: 1955/09/04  Subjective/Objective:   Right toe amputation                 Action/Plan: Discharge Planning: NCM spoke to pt. Pt has crutches for home. Pt states he lives at home alone but able to manage. Has RW at home.   PCP- Angelica Chessman E MD  Expected Discharge Date:                 Expected Discharge Plan:  Home/Self Care  In-House Referral:  NA  Discharge planning Services  CM Consult  Post Acute Care Choice:  NA Choice offered to:  NA  DME Arranged:  Crutches DME Agency:  NA  HH Arranged:  NA HH Agency:  NA  Status of Service:  Completed, signed off  If discussed at [place] of Stay Meetings, dates discussed:    Additional Comments:  Erenest Rasher, RN 06/13/2016, 3:19 PM

## 2016-06-14 ENCOUNTER — Other Ambulatory Visit (INDEPENDENT_AMBULATORY_CARE_PROVIDER_SITE_OTHER): Payer: Self-pay | Admitting: Family

## 2016-06-14 ENCOUNTER — Encounter (HOSPITAL_COMMUNITY): Payer: Self-pay | Admitting: Orthopedic Surgery

## 2016-06-14 ENCOUNTER — Telehealth (INDEPENDENT_AMBULATORY_CARE_PROVIDER_SITE_OTHER): Payer: Self-pay

## 2016-06-14 DIAGNOSIS — M868X7 Other osteomyelitis, ankle and foot: Secondary | ICD-10-CM | POA: Diagnosis not present

## 2016-06-14 MED ORDER — OXYCODONE-ACETAMINOPHEN 5-325 MG PO TABS: 1.0000 | ORAL_TABLET | ORAL | 0 refills | Status: DC | PRN

## 2016-06-14 MED ORDER — TRAMADOL HCL 50 MG PO TABS: 50.0000 mg | ORAL_TABLET | Freq: Two times a day (BID) | ORAL | 0 refills | Status: AC

## 2016-06-14 MED ORDER — COLCHICINE 0.6 MG PO TABS: 0.6000 mg | ORAL_TABLET | Freq: Two times a day (BID) | ORAL | Status: DC | PRN

## 2016-06-14 MED ORDER — PANCRELIPASE (LIP-PROT-AMYL) 12000-38000 UNITS PO CPEP
24000.0000 [IU] | ORAL_CAPSULE | Freq: Every day | ORAL | Status: DC
  Filled 2016-06-14 (×3): qty 2

## 2016-06-14 NOTE — Progress Notes (Signed)
Called Dr Sharol Given, regarding AVS papers are not sighned

## 2016-06-14 NOTE — Telephone Encounter (Signed)
Per Junie Panning to give rx for Tramadol 50mg  bid #30 this was entered into system.

## 2016-06-14 NOTE — Progress Notes (Signed)
Called Dr Sharol Given spoke with Junie Panning for discharge papers and medications for Mr Vigen, went over medications and papers with full understanding.  Patient wants to drive home because his car is here I called ED social worker and she states that is fine she will bring him a taxi voucher for him and he can pick up his vehicle tomorrow at the hospital due to medications the pt has received today.

## 2016-06-14 NOTE — Progress Notes (Signed)
Discharge Diagnoses:  Active Problems:   Amputated toe, right (HCC)   Surgeries: Procedure(s): RIGHT 2nd Toe Amputation on 06/13/2016    Consultants:   Discharged Condition: Improved  Hospital Course: Stephen Whitaker is an 60 y.o. male who was admitted 06/13/2016 with a chief complaint of osteomyelitis right second toe, and found to have a diagnosis of Osteomyelitis RIGHT 2nd Toe.  They were brought to the operating room on 06/13/2016 and underwent Procedure(s): RIGHT 2nd Toe Amputation.  Hospital course has been unremarkable. States is ready for discharge. Has assistance at home.  They were given perioperative antibiotics:  Anti-infectives    Start     Dose/Rate Route Frequency Ordered Stop   06/13/16 1230  ceFAZolin (ANCEF) IVPB 1 g/50 mL premix     1 g 100 mL/hr over 30 Minutes Intravenous Every 6 hours 06/13/16 1116 06/13/16 2338   06/13/16 0532  ceFAZolin (ANCEF) IVPB 2g/100 mL premix     2 g 200 mL/hr over 30 Minutes Intravenous On call to O.R. 06/13/16 0532 06/13/16 0845    .  They were given sequential compression devices, early ambulation, and Other (comment) ASA for DVT prophylaxis.  Recent vital signs:  Patient Vitals for the past 24 hrs:  BP Temp Temp src Pulse Resp SpO2  06/14/16 0431 139/68 99.1 F (37.3 C) Oral 82 18 100 %  06/13/16 2030 (!) 141/79 99.1 F (37.3 C) Oral 76 18 100 %  06/13/16 1118 (!) 145/93 97.5 F (36.4 C) Oral 68 18 100 %  06/13/16 1030 - 97.3 F (36.3 C) - 68 17 100 %  06/13/16 1000 - - - 72 14 100 %  06/13/16 0930 - - - 64 11 100 %  06/13/16 0906 124/74 97.3 F (36.3 C) - 70 19 100 %  .  Recent laboratory studies: No results found.   Diagnostic Studies: No results found.  They benefited maximally from their hospital stay and there were no complications.     Disposition: 01-Home or Self Care Discharge Instructions    Call MD / Call 911    Complete by:  As directed    If you experience chest pain or shortness of breath,  CALL 911 and be transported to the hospital emergency room.  If you develope a fever above 101 F, pus (white drainage) or increased drainage or redness at the wound, or calf pain, call your surgeon's office.   Call MD / Call 911    Complete by:  As directed    If you experience chest pain or shortness of breath, CALL 911 and be transported to the hospital emergency room.  If you develope a fever above 101 F, pus (white drainage) or increased drainage or redness at the wound, or calf pain, call your surgeon's office.   Constipation Prevention    Complete by:  As directed    Drink plenty of fluids.  Prune juice may be helpful.  You may use a stool softener, such as Colace (over the counter) 100 mg twice a day.  Use MiraLax (over the counter) for constipation as needed.   Constipation Prevention    Complete by:  As directed    Drink plenty of fluids.  Prune juice may be helpful.  You may use a stool softener, such as Colace (over the counter) 100 mg twice a day.  Use MiraLax (over the counter) for constipation as needed.   Diet - low sodium heart healthy    Complete by:  As directed  Diet - low sodium heart healthy    Complete by:  As directed    Increase activity slowly as tolerated    Complete by:  As directed    Increase activity slowly as tolerated    Complete by:  As directed    Post-op shoe    Complete by:  As directed    Touch down weight bearing    Complete by:  As directed    Laterality:  right   Extremity:  Lower     Follow-up Information    [name], MD In 1 week.   Specialty:  Orthopedic Surgery Contact information: [place] [address] [phone]            Signed: Suzan Slick 06/14/2016, 7:37 AM

## 2016-06-14 NOTE — Evaluation (Signed)
Physical Therapy Evaluation Patient Details Name: Stephen Whitaker MRN: [id number] DOB: [birth year] Today's Date: 06/14/2016   History of Present Illness  Pt is a 60 y/o male s/o R 2nd toe amputation. PMH including but not limited to COPD, gout, HTN and hx of R 2nd toe amputation and L toe amputation.  Clinical Impression  Pt presented supine in bed with HOB elevated, awake and willing to participate in therapy session. Prior to admission, pt reported that he was independent with all functional mobility. Pt moving well during evaluation; however, reporting an increase in pain which he attributed to gout. Pt declined stair training at this time. As pt has had this procedure done in the past, he stated that he understands how to safely ascend and descend stairs. Pt would continue to benefit from skilled physical therapy services at this time while admitted to address his below listed limitations in order to improve his overall safety and independence with functional mobility.      Follow Up Recommendations Supervision - Intermittent    Equipment Recommendations  None recommended by PT    Recommendations for Other Services       Precautions / Restrictions Precautions Precautions: Fall Restrictions Weight Bearing Restrictions: Yes RLE Weight Bearing: Weight bearing as tolerated      Mobility  Bed Mobility Overal bed mobility: Needs Assistance Bed Mobility: Supine to Sit     Supine to sit: HOB elevated;Min guard     General bed mobility comments: pt required increased time, HOB elevated and min guard for safety  Transfers Overall transfer level: Needs assistance Equipment used: Rolling walker (2 wheeled) Transfers: Sit to/from Stand Sit to Stand: Min guard         General transfer comment: pt required increased time and min guard for safety. pt demonstrated good hand placement and technique.  Ambulation/Gait Ambulation/Gait assistance: Min guard Ambulation Distance  (Feet): 50 Feet Assistive device: Rolling walker (2 wheeled) Gait Pattern/deviations: Step-to pattern;Step-through pattern;Decreased step length - left;Decreased stance time - right;Decreased weight shift to right Gait velocity: decreased Gait velocity interpretation: Below normal speed for age/gender General Gait Details: pt demonstrated safe technique with RW  Stairs            Wheelchair Mobility    Modified Rankin (Stroke Patients Only)       Balance Overall balance assessment: Needs assistance Sitting-balance support: Feet supported;No upper extremity supported Sitting balance-Leahy Scale: Fair     Standing balance support: During functional activity;No upper extremity supported Standing balance-Leahy Scale: Fair Standing balance comment: pt able to stand and use urinal without UE supports                              Pertinent Vitals/Pain Pain Assessment: Faces Faces Pain Scale: Hurts even more Pain Location: R foot Pain Descriptors / Indicators: Throbbing;Sharp;Grimacing;Guarding;Moaning Pain Intervention(s): Monitored during session;Repositioned    Home Living Family/patient expects to be discharged to:: Private residence Living Arrangements: Alone Available Help at Discharge: Other (Comment) (None)   Home Access: Stairs to enter Entrance Stairs-Rails: None Entrance Stairs-Number of Steps: 4 Home Layout: One level Home Equipment: Walker - 2 wheels;Crutches      Prior Function Level of Independence: Independent               Hand Dominance        Extremity/Trunk Assessment   Upper Extremity Assessment: Overall WFL for tasks assessed  Lower Extremity Assessment: RLE deficits/detail RLE Deficits / Details: pt is s/p R 2nd toe amputation and WBAT. pt demostrated functional strength and was able to lift R LE against gravity during bed mobility.    Cervical / Trunk Assessment: Normal  Communication   Communication:  No difficulties  Cognition Arousal/Alertness: Awake/alert Behavior During Therapy: WFL for tasks assessed/performed Overall Cognitive Status: Within Functional Limits for tasks assessed                      General Comments      Exercises     Assessment/Plan    PT Assessment Patient needs continued PT services  PT Problem List Decreased strength;Decreased activity tolerance;Decreased balance;Decreased mobility;Decreased coordination;Decreased knowledge of use of DME;Pain          PT Treatment Interventions DME instruction;Gait training;Stair training;Functional mobility training;Therapeutic activities;Therapeutic exercise;Balance training;Neuromuscular re-education;Patient/family education    PT Goals (Current goals can be found in the Care Plan section)  Acute Rehab PT Goals Patient Stated Goal: return home PT Goal Formulation: With patient Time For Goal Achievement: 06/21/16 Potential to Achieve Goals: Good    Frequency Min 5X/week   Barriers to discharge Decreased caregiver support      Co-evaluation               End of Session Equipment Utilized During Treatment: Gait belt Activity Tolerance: Patient limited by pain Patient left: in chair;with call bell/phone within reach Nurse Communication: Mobility status    Functional Assessment Tool Used: clinical judgement Functional Limitation: Mobility: Walking and moving around Mobility: Walking and Moving Around Current Status [id number]): At least 1 percent but less than 20 percent impaired, limited or restricted Mobility: Walking and Moving Around Goal Status [phone]): 0 percent impaired, limited or restricted    Time: 0940-1000 PT Time Calculation (min) (ACUTE ONLY): 20 min   Charges:   PT Evaluation $PT Eval Moderate Complexity: 1 Procedure     PT G Codes:   PT G-Codes **NOT FOR INPATIENT CLASS** Functional Assessment Tool Used: clinical judgement Functional Limitation: Mobility: Walking and  moving around Mobility: Walking and Moving Around Current Status [id number]): At least 1 percent but less than 20 percent impaired, limited or restricted Mobility: Walking and Moving Around Goal Status [phone]): 0 percent impaired, limited or restricted    [facility] 06/14/2016, 10:10 AM Sherie Don, PT, DPT [phone]

## 2016-06-20 ENCOUNTER — Ambulatory Visit (INDEPENDENT_AMBULATORY_CARE_PROVIDER_SITE_OTHER): Payer: BLUE CROSS/BLUE SHIELD | Admitting: Orthopedic Surgery

## 2016-06-20 ENCOUNTER — Encounter (INDEPENDENT_AMBULATORY_CARE_PROVIDER_SITE_OTHER): Payer: Self-pay | Admitting: Orthopedic Surgery

## 2016-06-20 VITALS — Ht 74.0 in | Wt 194.0 lb

## 2016-06-20 DIAGNOSIS — Z89421 Acquired absence of other right toe(s): Secondary | ICD-10-CM

## 2016-06-20 NOTE — Progress Notes (Signed)
Wound Care Note   Patient: Stephen Whitaker           Date of Birth: 07/20/1956           MRN: [id number]             PCP: Angelica Chessman, MD Visit Date: 06/20/2016   Assessment & Plan: Visit Diagnoses:  1. S/P amputation of lesser toe, right (HCC)   1 week out  Plan: Weightbearing as tolerated in a postoperative shoe he may advance to a sneaker as the swelling resolves wash with soap and water and apply a Band-Aid with antibiotic ointment daily.   Follow-Up Instructions: No Follow-up on file.  Orders:  No orders of the defined types were placed in this encounter.  No orders of the defined types were placed in this encounter.     Procedures: No notes on file   Clinical Data: No additional findings.   No images are attached to the encounter.   Subjective: Chief Complaint  Patient presents with  . Right Foot - Routine Post Op  . Routine Post Op    Right 2nd toe amputation 06/14/16    Patient presents today for follow up right 2nd amputation on 06/14/16. He is 6 days post op. He has slight maceration at incision in between web space. He ambulates with cane today. He is taking oxycodone for his pain, he states the tramadol made him feel funny so he decided to discontinue this medication. There is no drainage and minimal swelling. He is currently out of work for surgery and wants to know if the the cause of his surgery secondary to his work.     Review of Systems  Miscellaneous:  -Home Health Care: no  -Physical Therapy: no  -Out of Work?: currently out of work     Objective: Vital Signs: Ht 6\' 2"  (1.88 m)   Wt 194 lb (88 kg)   BMI 24.91 kg/m   Physical Exam: On examination incision is well approximated there is no redness no cellulitis no drainage no signs of infection. There is essentially no swelling.  Specialty Comments: No specialty comments available.   PMFS History: Patient Active Problem List   Diagnosis Date Noted  . Amputated toe,  right ([place]) 06/13/2016  . Chronic osteomyelitis of left foot ([place]) 11/24/2015  . Chronic bronchitis ([place]) 11/24/2015  . Osteomyelitis of toe of left foot ([place]) 11/16/2015  . Other pancytopenia ([place]) 01/24/2015  . Chronic pancreatitis ([place]) 01/24/2015  . Osteomyelitis ([place])   . Blood poisoning ([place])   . Neutropenia ([place])   . Iron deficiency anemia due to chronic blood loss   . Leukopenia   . Ulcer of great toe ([place])   . Sepsis ([place]) 01/21/2015  . Leucopenia 01/21/2015  . Anemia 01/21/2015  . Syncope 01/21/2015  . Diarrhea 01/21/2015  . Hypokalemia 01/21/2015  . Loss of consciousness ([place])   . Eczema 10/21/2014  . Essential hypertension 10/21/2014  . Vasovagal syncope 10/21/2014  . Bilateral conjunctivitis 08/10/2014  . Other chronic pancreatitis ([place]) 08/10/2014  . COLD (chronic obstructive lung disease) ([place]) 08/10/2014  . Chronic ethmoidal sinusitis 08/10/2014  . BRONCHITIS 10/17/2010  . PLANTAR FASCIITIS, LEFT 10/17/2010  . OTHER NEUTROPENIA 04/21/2010  . CALLUS, TOE 03/15/2010  . LEUKOPENIA, MILD 02/20/2010  . LIVER FUNCTION TESTS, ABNORMAL, HX OF 02/16/2010  . DENTAL CARIES 02/15/2010  . ONYCHOMYCOSIS 07/05/2009  . ALLERGIC URTICARIA 07/05/2009  . PAIN IN LIMB 07/05/2009  . ERECTILE DYSFUNCTION 05/25/2008  . INFLUENZA  05/25/2008  . VIRAL INFECTION 10/01/2007  . HSV 05/02/2007  . CONDYLOMA ACUMINATUM 05/02/2007  . HYPERTENSION 05/02/2007  . ALLERGIC RHINITIS 05/02/2007  . ASTHMA 05/02/2007  . GERD 05/02/2007  . PANCREATITIS 05/02/2007   Past Medical History:  Diagnosis Date  . Arthritis   . Chronic bronchitis ([place])   . COPD (chronic obstructive pulmonary disease) (HCC)    Chronic Bronchitis  . Dyspnea   . GERD (gastroesophageal reflux disease)   . Gout   . Herpes genitalia   . Hypertension   . Osteomyelitis ([place]) 05/2016   Osteomyelitis  of right 2nd toe  . Pancreatitis, acute   . Pneumonia 2016    Family History  Problem Relation Age of Onset  .  Adopted: Yes   Past Surgical History:  Procedure Laterality Date  . AMPUTATION Left 07/14/2014   Procedure: 2nd Toe Amputation;  Surgeon: [name], MD;  Location: [facility];  Service: Orthopedics;  Laterality: Left;  . AMPUTATION Left 11/16/2015   Procedure: Left Great Toe Amputation at Metatarsophalangeal Joint;  Surgeon: [name], MD;  Location: [facility];  Service: Orthopedics;  Laterality: Left;  . AMPUTATION Right 06/13/2016   Procedure: RIGHT 2nd Toe Amputation;  Surgeon: [name], MD;  Location: [facility];  Service: Orthopedics;  Laterality: Right;  . TOE AMPUTATION Left 11/16/2015   left great toe   . TOE AMPUTATION Right 06/13/2016  . TONSILLECTOMY     Social History   Occupational History  . Not on file.   Social History Main Topics  . Smoking status: Never Smoker  . Smokeless tobacco: Never Used  . Alcohol use No  . Drug use: No  . Sexual activity: Not on file

## 2016-06-22 ENCOUNTER — Encounter (HOSPITAL_COMMUNITY): Payer: Self-pay | Admitting: Emergency Medicine

## 2016-06-22 ENCOUNTER — Ambulatory Visit (HOSPITAL_COMMUNITY)
Admission: EM | Admit: 2016-06-22 | Discharge: 2016-06-22 | Disposition: A | Discharge status: Home / Self Care | Payer: BLUE CROSS/BLUE SHIELD | Attending: Internal Medicine | Admitting: Internal Medicine

## 2016-06-22 DIAGNOSIS — J42 Unspecified chronic bronchitis: Secondary | ICD-10-CM

## 2016-06-22 DIAGNOSIS — R509 Fever, unspecified: Secondary | ICD-10-CM | POA: Diagnosis not present

## 2016-06-22 DIAGNOSIS — Z89421 Acquired absence of other right toe(s): Secondary | ICD-10-CM | POA: Diagnosis not present

## 2016-06-22 DIAGNOSIS — J209 Acute bronchitis, unspecified: Secondary | ICD-10-CM

## 2016-06-22 MED ORDER — METHYLPREDNISOLONE SODIUM SUCC 125 MG IJ SOLR
INTRAMUSCULAR | Status: AC
  Filled 2016-06-22: qty 2

## 2016-06-22 MED ORDER — CEPHALEXIN 500 MG PO CAPS: 500.0000 mg | ORAL_CAPSULE | Freq: Two times a day (BID) | ORAL | 0 refills | Status: DC

## 2016-06-22 MED ORDER — METHYLPREDNISOLONE SODIUM SUCC 125 MG IJ SOLR: 125.0000 mg | Freq: Once | INTRAMUSCULAR | Status: DC

## 2016-06-22 MED ORDER — DOXYCYCLINE HYCLATE 100 MG PO CAPS: 100.0000 mg | ORAL_CAPSULE | Freq: Two times a day (BID) | ORAL | 0 refills | Status: DC

## 2016-06-22 MED ORDER — METHYLPREDNISOLONE SODIUM SUCC 125 MG IJ SOLR
125.0000 mg | Freq: Once | INTRAMUSCULAR | Status: AC
  Administered 2016-06-22: 125 mg via INTRAMUSCULAR

## 2016-06-22 NOTE — ED Triage Notes (Signed)
Here for fevers onset today associated w/nausea, and weakness  Reports amputation of 2nd toe on right foot...taking doxycycline   A&O x4... NAD

## 2016-06-22 NOTE — ED Provider Notes (Signed)
[place]    CSN: [id number] Arrival date & time: 06/22/16  1830     History   Chief Complaint Chief Complaint  Patient presents with  . Fever    HPI Stephen Whitaker is a 60 y.o. male. He had his right second toe amputated on October 25. This was due to chronic infection, which he attributes to maceration from working in the [employer]. This was his third toe amputation. Having some numbness in his feet, and a circulatory difficulty.  He was on doxycycline until a couple of days ago, for a few weeks, because of toe infection. 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 He also has chronic bronchitis, and presents today with a fever of 101.8 earlier today. He has noticed increasing difficulty coughing up phlegm from baseline, and equivocal change in chronic sinus congestion and runny nose. He has had nausea and malaise today. No vomiting, but hasn't eaten anything. No diarrhea, no abdominal pain. No dysuria. He has had some urinary frequency and small volumes, which he attributes to not drinking very much.  HPI  Past Medical History:  Diagnosis Date  . Arthritis   . Chronic bronchitis ([place])   . COPD (chronic obstructive pulmonary disease) (HCC)    Chronic Bronchitis  . Dyspnea   . GERD (gastroesophageal reflux disease)   . Gout   . Herpes genitalia   . Hypertension   . Osteomyelitis ([place]) 05/2016   Osteomyelitis  of right 2nd toe  . Pancreatitis, acute   . Pneumonia 2016    Patient Active Problem List   Diagnosis Date Noted  . Amputated toe, right ([place]) 06/13/2016  . Chronic osteomyelitis of left foot ([place]) 11/24/2015  . Chronic bronchitis ([place]) 11/24/2015  . Osteomyelitis of toe of left foot ([place]) 11/16/2015  . Other pancytopenia ([place]) 01/24/2015  . Chronic pancreatitis ([place]) 01/24/2015  . Osteomyelitis ([place])   . Blood poisoning ([place])   . Neutropenia ([place])   . Iron deficiency anemia due to chronic blood loss   .  Leukopenia   . Ulcer of great toe ([place])   . Sepsis ([place]) 01/21/2015  . Leucopenia 01/21/2015  . Anemia 01/21/2015  . Syncope 01/21/2015  . Diarrhea 01/21/2015  . Hypokalemia 01/21/2015  . Loss of consciousness ([place])   . Eczema 10/21/2014  . Essential hypertension 10/21/2014  . Vasovagal syncope 10/21/2014  . Bilateral conjunctivitis 08/10/2014  . Other chronic pancreatitis ([place]) 08/10/2014  . COLD (chronic obstructive lung disease) ([place]) 08/10/2014  . Chronic ethmoidal sinusitis 08/10/2014  . BRONCHITIS 10/17/2010  . PLANTAR FASCIITIS, LEFT 10/17/2010  . OTHER NEUTROPENIA 04/21/2010  . CALLUS, TOE 03/15/2010  . LEUKOPENIA, MILD 02/20/2010  . LIVER FUNCTION TESTS, ABNORMAL, HX OF 02/16/2010  . DENTAL CARIES 02/15/2010  . ONYCHOMYCOSIS 07/05/2009  . ALLERGIC URTICARIA 07/05/2009  . PAIN IN LIMB 07/05/2009  . ERECTILE DYSFUNCTION 05/25/2008  . INFLUENZA 05/25/2008  . VIRAL INFECTION 10/01/2007  . HSV 05/02/2007  . CONDYLOMA ACUMINATUM 05/02/2007  . HYPERTENSION 05/02/2007  . ALLERGIC RHINITIS 05/02/2007  . ASTHMA 05/02/2007  . GERD 05/02/2007  . PANCREATITIS 05/02/2007    Past Surgical History:  Procedure Laterality Date  . AMPUTATION Left 07/14/2014   Procedure: 2nd Toe Amputation;  Surgeon: [name], MD;  Location: [facility];  Service: Orthopedics;  Laterality: Left;  . AMPUTATION Left 11/16/2015   Procedure: Left Great Toe Amputation at Metatarsophalangeal Joint;  Surgeon: [name], MD;  Location: [facility]  OR;  Service: Orthopedics;  Laterality: Left;  . AMPUTATION Right 06/13/2016   Procedure: RIGHT 2nd Toe Amputation;  Surgeon: [name], MD;  Location: [facility];  Service: Orthopedics;  Laterality: Right;  . TOE AMPUTATION Left 11/16/2015   left great toe   . TOE AMPUTATION Right 06/13/2016  . TONSILLECTOMY         Home Medications    Prior to Admission medications   Medication Sig Start Date End Date Taking? Authorizing Provider  albuterol (VENTOLIN HFA)  108 (90 Base) MCG/ACT inhaler INHALE TWO PUFFS BY MOUTH EVERY 4 HOURS AS NEEDED FOR WHEEZING OR SHORTNESS OF BREATH 05/17/16  Yes Tresa Garter, MD  allopurinol (ZYLOPRIM) 300 MG tablet Take 300 mg by mouth daily.   Yes Historical Provider, MD  Bioflavonoid Products (ESTER C PO) Take 1 tablet by mouth daily.   Yes Historical Provider, MD  clobetasol ointment (TEMOVATE) [id number] % Apply 1 application topically 2 (two) times daily. Patient taking differently: Apply 1 application topically 2 (two) times daily as needed (for skin irritation).  11/05/14  Yes Olugbemiga E Doreene Burke, MD  COLCRYS 0.6 MG tablet Take 0.6 mg by mouth 2 (two) times daily as needed (gout flare).  06/04/16  Yes Historical Provider, MD  cyclobenzaprine (FEXMID) 7.5 MG tablet Take 7.5 mg by mouth 4 (four) times daily as needed for muscle spasms.  05/11/16  Yes Historical Provider, MD  Doxepin HCl 5 % CREA Apply 1 application topically daily as needed (itching).  05/11/16  Yes Historical Provider, MD  fluticasone (FLONASE) 50 MCG/ACT nasal spray USE ONE SPRAY(S) IN EACH NOSTRIL ONCE DAILY 05/17/16  Yes Tresa Garter, MD  Fluticasone-Salmeterol (ADVAIR) 500-50 MCG/DOSE AEPB Inhale 1 puff into the lungs 2 (two) times daily. 05/17/16  Yes Tresa Garter, MD  hydrochlorothiazide (HYDRODIURIL) 25 MG tablet Take 1 tablet (25 mg total) by mouth daily. 05/17/16  Yes Tresa Garter, MD  ibuprofen (ADVIL,MOTRIN) 800 MG tablet Take 800 mg by mouth every 8 (eight) hours as needed for moderate pain.    Yes Historical Provider, MD  loperamide (IMODIUM A-D) 2 MG tablet Take 2 mg by mouth 4 (four) times daily as needed for diarrhea or loose stools.   Yes Historical Provider, MD  montelukast (SINGULAIR) 10 MG tablet Take 1 tablet (10 mg total) by mouth at bedtime. 05/17/16  Yes Tresa Garter, MD  Multiple Vitamin (MULTIVITAMIN WITH MINERALS) TABS tablet Take 1 tablet by mouth daily. Patient taking differently: Take 1 tablet by mouth 3  (three) times daily.  11/24/15  Yes Tresa Garter, MD  Naphazoline-Pheniramine (EYE ALLERGY RELIEF OP) Place 1 drop into both eyes daily as needed (for allergy eyes).   Yes Historical Provider, MD  Neomy-Bacit-Polymyx-Pramoxine (TRIPLE ANTIBIOTIC+PAIN RELIEF EX) Apply 1 application topically daily as needed (apply to affected toe).   Yes Historical Provider, MD  oxyCODONE-acetaminophen (PERCOCET/ROXICET) 5-325 MG tablet Take 1 tablet by mouth every 4 (four) hours as needed for severe pain. 06/14/16  Yes Chrystie Nose, NP  pantoprazole (PROTONIX) 40 MG tablet Take 1 tablet (40 mg total) by mouth daily. 05/17/16  Yes Tresa Garter, MD  terbinafine (LAMISIL AT) 1 % cream Apply 1 application topically 2 (two) times daily. Patient taking differently: Apply 1 application topically 2 (two) times daily as needed (rash).  05/17/16  Yes Olugbemiga Essie Christine, MD  traMADol (ULTRAM) 50 MG tablet Take 1 tablet (50 mg total) by mouth 2 (two) times daily. 06/14/16 07/14/16 Yes Erin  Jerrol Banana, NP  cephALEXin (KEFLEX) 500 MG capsule Take 1 capsule (500 mg total) by mouth 2 (two) times daily. 06/22/16   Sherlene Shams, MD  doxycycline (VIBRAMYCIN) 100 MG capsule Take 1 capsule (100 mg total) by mouth 2 (two) times daily. 06/22/16   Sherlene Shams, MD  lidocaine (XYLOCAINE) 5 % ointment Apply 1 application topically daily as needed for moderate pain.  05/10/16   Historical Provider, MD  OVER THE COUNTER MEDICATION Take 1 capsule by mouth 3 (three) times daily with meals. Blue/green algae    Historical Provider, MD  oxyCODONE-acetaminophen (ROXICET) 5-325 MG tablet Take 1 tablet by mouth every 4 (four) hours as needed for severe pain. Patient not taking: Reported on 06/20/2016 06/13/16   [name], MD  Pancrelipase, Lip-Prot-Amyl, (CREON) 24000-76000 units CPEP Take 1 capsule (24,000 Units total) by mouth daily. 05/17/16   Tresa Garter, MD    Family History Family History  Problem Relation Age of  Onset  . Adopted: Yes    Social History Social History  Substance Use Topics  . Smoking status: Never Smoker  . Smokeless tobacco: Never Used  . Alcohol use No     Allergies   Ciprofloxacin and Sulfa antibiotics   Review of Systems Review of Systems  All other systems reviewed and are negative.    Physical Exam Triage Vital Signs ED Triage Vitals  Enc Vitals Group     BP 06/22/16 1846 156/89     Pulse Rate 06/22/16 1846 104     Resp 06/22/16 1846 20     Temp 06/22/16 1846 100 F (37.8 C)     Temp Source 06/22/16 1846 Oral     SpO2 06/22/16 1846 100 %     Weight --      Height --      Pain Score 06/22/16 1850 6   Updated Vital Signs BP 156/89 (BP Location: Left Arm)   Pulse 104   Temp 100 F (37.8 C) (Oral)   Resp 20   SpO2 100%  Physical Exam  Constitutional: He is oriented to person, place, and time. No distress.  Alert, nicely groomed  HENT:  Head: Atraumatic.  Bilateral TMs are dull, and the left TM is flushed pink. Moderate nasal congestion Throat is red with postnasal drainage evident  Eyes:  Conjugate gaze, no eye redness/drainage  Neck: Neck supple.  Cardiovascular: Regular rhythm.   Heart rate 100s  Pulmonary/Chest: No respiratory distress. He has no wheezes. He has no rales.  Coarse but symmetric breath sounds throughout  Abdominal: He exhibits no distension.  Musculoskeletal: Normal range of motion.  Surgical site at the right toes is maybe slightly swollen, no erythema, scant serosanguineous drainage on the Band-Aid. No fluctuance, not able to express any drainage. Foot is warm, not clear that there is any difference in temperature between the 2 feet.  Neurological: He is alert and oriented to person, place, and time.  Skin: Skin is warm and dry.  No cyanosis  Nursing note and vitals reviewed.    UC Treatments / Results   Procedures Procedures (including critical care time)  Medications Ordered in UC Medications    methylPREDNISolone sodium succinate (SOLU-MEDROL) 125 mg/2 mL injection 125 mg (not administered)    Final Clinical Impressions(s) / UC Diagnoses   Final diagnoses:  Febrile illness, acute  Chronic bronchitis with acute exacerbation (HCC)  History of amputation of lesser toe of right foot (HCC)   Injection of solumedrol for bronchitis  exacerbation was given today at urgent care.  Prescriptions for doxycycline and cephalexin, to cover possibility of toe infection and bronchitis, were sent to the pharmacy.  Followup as planned on 11/15 with Dr Sharol Given about your toe.  Recheck or followup with Dr Doreene Burke for persistent fever/weakness.  New Prescriptions New Prescriptions   CEPHALEXIN (KEFLEX) 500 MG CAPSULE    Take 1 capsule (500 mg total) by mouth 2 (two) times daily.   DOXYCYCLINE (VIBRAMYCIN) 100 MG CAPSULE    Take 1 capsule (100 mg total) by mouth 2 (two) times daily.     Sherlene Shams, MD 06/26/16 1155

## 2016-06-22 NOTE — Discharge Instructions (Addendum)
Injection of solumedrol for bronchitis exacerbation was given today at urgent care.  Prescriptions for doxycycline and cephalexin, to cover possibility of toe infection and bronchitis, were sent to the pharmacy.  Followup as planned on 11/15 with Dr Sharol Given about your toe.  Recheck or followup with Dr Doreene Burke for persistent fever/weakness.

## 2016-07-04 ENCOUNTER — Ambulatory Visit (INDEPENDENT_AMBULATORY_CARE_PROVIDER_SITE_OTHER): Payer: BLUE CROSS/BLUE SHIELD | Admitting: Orthopedic Surgery

## 2016-07-05 ENCOUNTER — Ambulatory Visit (INDEPENDENT_AMBULATORY_CARE_PROVIDER_SITE_OTHER): Payer: BLUE CROSS/BLUE SHIELD | Admitting: Family

## 2016-07-05 ENCOUNTER — Encounter (INDEPENDENT_AMBULATORY_CARE_PROVIDER_SITE_OTHER): Payer: Self-pay | Admitting: Orthopedic Surgery

## 2016-07-05 VITALS — Ht 74.0 in | Wt 194.0 lb

## 2016-07-05 DIAGNOSIS — M76829 Posterior tibial tendinitis, unspecified leg: Secondary | ICD-10-CM

## 2016-07-05 DIAGNOSIS — M2140 Flat foot [pes planus] (acquired), unspecified foot: Secondary | ICD-10-CM | POA: Diagnosis not present

## 2016-07-05 DIAGNOSIS — IMO0002 Reserved for concepts with insufficient information to code with codable children: Secondary | ICD-10-CM

## 2016-07-05 DIAGNOSIS — Z89421 Acquired absence of other right toe(s): Secondary | ICD-10-CM

## 2016-07-05 MED ORDER — OXYCODONE-ACETAMINOPHEN 5-325 MG PO TABS: 1.0000 | ORAL_TABLET | Freq: Three times a day (TID) | ORAL | 0 refills | Status: DC | PRN

## 2016-07-05 MED ORDER — ALLOPURINOL 300 MG PO TABS: 300.0000 mg | ORAL_TABLET | Freq: Every day | ORAL | 2 refills | Status: DC

## 2016-07-05 NOTE — Progress Notes (Signed)
Office Visit Note   Patient: Stephen Whitaker           Date of Birth: 01-01-1956           MRN: [id number] Visit Date: 07/05/2016              Requested by: Tresa Garter, MD [place], [place] [id number] PCP: Angelica Chessman, MD   Assessment & Plan: Visit Diagnoses:  1. Toe amputation status, right ([place])   2. Posterior tibial tendon dysfunction     Plan: Have provided him with a posterior tibial tendon brace for the left foot. May resume regular shoewear on the right. Weightbearing as tolerated.   Follow-Up Instructions: Return in about 4 weeks (around 08/02/2016).   Orders:  No orders of the defined types were placed in this encounter.  Meds ordered this encounter  Medications  . allopurinol (ZYLOPRIM) 300 MG tablet    Sig: Take 1 tablet (300 mg total) by mouth daily.    Dispense:  60 tablet    Refill:  2  . oxyCODONE-acetaminophen (ROXICET) 5-325 MG tablet    Sig: Take 1 tablet by mouth every 8 (eight) hours as needed for severe pain.    Dispense:  30 tablet    Refill:  0      Procedures: No procedures performed   Clinical Data: No additional findings.   Subjective: Chief Complaint  Patient presents with  . Right Foot - Routine Post Op    06/13/16 RIGHT FOOT 2ND RAY AMPUTATION    Patient is 3 weeks s/p a right foot 2nd ray amputation. He is full weight bearing in a regular shoe. He is not currently taking any antibiotics and requests a refill of his percocet and allopurinol while in the office today.  Also complaining of left ankle pain. States has been told in the past that he has posterior tibial tendinitis. This continues to bother him feel the left foot and ankle are unstable. Pain with ambulation. This is a chronic issue with waxing and waning symptoms.  Review of Systems  Constitutional: Negative for chills and fever.     Objective: Vital Signs: Ht 6\' 2"  (1.88 m)   Wt 194 lb (88 kg)   BMI 24.91 kg/m   Physical  Exam  Left Ankle Exam  Swelling: none  Tenderness  Left ankle tenderness location: Tender along the posterior tibial tendon, pain with single limb heel raise.   Range of Motion  The patient has normal left ankle ROM.      right foot: The second ray amputation is well healed. There are no open areas noted drainage no erythema or sign of infection.  Specialty Comments:  No specialty comments available.  Imaging: No results found.   PMFS History: Patient Active Problem List   Diagnosis Date Noted  . Amputated toe, right ([place]) 06/13/2016  . Chronic osteomyelitis of left foot ([place]) 11/24/2015  . Chronic bronchitis ([place]) 11/24/2015  . Osteomyelitis of toe of left foot ([place]) 11/16/2015  . Other pancytopenia ([place]) 01/24/2015  . Chronic pancreatitis ([place]) 01/24/2015  . Osteomyelitis ([place])   . Blood poisoning ([place])   . Neutropenia ([place])   . Iron deficiency anemia due to chronic blood loss   . Leukopenia   . Ulcer of great toe ([place])   . Sepsis ([place]) 01/21/2015  . Leucopenia 01/21/2015  . Anemia 01/21/2015  . Syncope 01/21/2015  . Diarrhea 01/21/2015  . Hypokalemia 01/21/2015  . Loss of consciousness ([place])   .  Eczema 10/21/2014  . Essential hypertension 10/21/2014  . Vasovagal syncope 10/21/2014  . Bilateral conjunctivitis 08/10/2014  . Other chronic pancreatitis ([place]) 08/10/2014  . COLD (chronic obstructive lung disease) ([place]) 08/10/2014  . Chronic ethmoidal sinusitis 08/10/2014  . BRONCHITIS 10/17/2010  . PLANTAR FASCIITIS, LEFT 10/17/2010  . OTHER NEUTROPENIA 04/21/2010  . CALLUS, TOE 03/15/2010  . LEUKOPENIA, MILD 02/20/2010  . LIVER FUNCTION TESTS, ABNORMAL, HX OF 02/16/2010  . DENTAL CARIES 02/15/2010  . ONYCHOMYCOSIS 07/05/2009  . ALLERGIC URTICARIA 07/05/2009  . PAIN IN LIMB 07/05/2009  . ERECTILE DYSFUNCTION 05/25/2008  . INFLUENZA 05/25/2008  . VIRAL INFECTION 10/01/2007  . HSV 05/02/2007  . CONDYLOMA ACUMINATUM 05/02/2007  . HYPERTENSION 05/02/2007  .  ALLERGIC RHINITIS 05/02/2007  . ASTHMA 05/02/2007  . GERD 05/02/2007  . PANCREATITIS 05/02/2007   Past Medical History:  Diagnosis Date  . Arthritis   . Chronic bronchitis ([place])   . COPD (chronic obstructive pulmonary disease) (HCC)    Chronic Bronchitis  . Dyspnea   . GERD (gastroesophageal reflux disease)   . Gout   . Herpes genitalia   . Hypertension   . Osteomyelitis ([place]) 05/2016   Osteomyelitis  of right 2nd toe  . Pancreatitis, acute   . Pneumonia 2016    Family History  Problem Relation Age of Onset  . Adopted: Yes    Past Surgical History:  Procedure Laterality Date  . AMPUTATION Left 07/14/2014   Procedure: 2nd Toe Amputation;  Surgeon: [name], MD;  Location: [facility];  Service: Orthopedics;  Laterality: Left;  . AMPUTATION Left 11/16/2015   Procedure: Left Great Toe Amputation at Metatarsophalangeal Joint;  Surgeon: [name], MD;  Location: [facility];  Service: Orthopedics;  Laterality: Left;  . AMPUTATION Right 06/13/2016   Procedure: RIGHT 2nd Toe Amputation;  Surgeon: [name], MD;  Location: [facility];  Service: Orthopedics;  Laterality: Right;  . TOE AMPUTATION Left 11/16/2015   left great toe   . TOE AMPUTATION Right 06/13/2016  . TONSILLECTOMY     Social History   Occupational History  . Not on file.   Social History Main Topics  . Smoking status: Never Smoker  . Smokeless tobacco: Never Used  . Alcohol use No  . Drug use: No  . Sexual activity: Not on file

## 2016-07-09 ENCOUNTER — Other Ambulatory Visit: Payer: Self-pay | Admitting: Internal Medicine

## 2016-07-09 ENCOUNTER — Telehealth: Payer: Self-pay | Admitting: Internal Medicine

## 2016-07-09 NOTE — Telephone Encounter (Signed)
Patient states PCP changed CREON intake to 3x a day, and is in need of new script in order for insurance to cover medication...  Please follow up

## 2016-07-17 ENCOUNTER — Encounter (HOSPITAL_COMMUNITY): Payer: Self-pay | Admitting: *Deleted

## 2016-07-17 ENCOUNTER — Ambulatory Visit (HOSPITAL_COMMUNITY)
Admission: EM | Admit: 2016-07-17 | Discharge: 2016-07-17 | Disposition: A | Discharge status: Home / Self Care | Payer: BLUE CROSS/BLUE SHIELD | Attending: Emergency Medicine | Admitting: Emergency Medicine

## 2016-07-17 DIAGNOSIS — J40 Bronchitis, not specified as acute or chronic: Secondary | ICD-10-CM

## 2016-07-17 MED ORDER — PREDNISONE 50 MG PO TABS: ORAL_TABLET | ORAL | 0 refills | Status: DC

## 2016-07-17 MED ORDER — AZITHROMYCIN 250 MG PO TABS: ORAL_TABLET | ORAL | 0 refills | Status: DC

## 2016-07-17 NOTE — Discharge Instructions (Signed)
You have a bronchitis flair. Take azithromycin and prednisone as prescribed. Make sure you continue your regular medicines. Use you albuterol every 4 hours as needed for wheezing or cough. You should see improvement in the next 3-5 days. If you develop fevers, difficulty breathing, or are just not getting better, please come back or go to the emergency room.

## 2016-07-17 NOTE — ED Provider Notes (Signed)
[place]    CSN: [id number] Arrival date & time: 07/17/16  1956     History   Chief Complaint Chief Complaint  Patient presents with  . URI    HPI Stephen Whitaker is a 60 y.o. male.   HPI  He is a 60 year old man here for sinus drainage and wheezing. He has a history of chronic bronchitis. He was seen here 3 weeks ago and treated with a course of doxycycline and the steroid shot. He reports persistence of his symptoms. He is using his albuterol inhaler more frequently. He also reports a lot of sinus drainage. No fevers. He is using his inhalers like he is supposed to. He is also using Flonase daily. He denies any fevers.  Past Medical History:  Diagnosis Date  . Arthritis   . Chronic bronchitis ([place])   . COPD (chronic obstructive pulmonary disease) (HCC)    Chronic Bronchitis  . Dyspnea   . GERD (gastroesophageal reflux disease)   . Gout   . Herpes genitalia   . Hypertension   . Osteomyelitis ([place]) 05/2016   Osteomyelitis  of right 2nd toe  . Pancreatitis, acute   . Pneumonia 2016    Patient Active Problem List   Diagnosis Date Noted  . Amputated toe, right ([place]) 06/13/2016  . Chronic osteomyelitis of left foot ([place]) 11/24/2015  . Chronic bronchitis ([place]) 11/24/2015  . Osteomyelitis of toe of left foot ([place]) 11/16/2015  . Other pancytopenia ([place]) 01/24/2015  . Chronic pancreatitis ([place]) 01/24/2015  . Osteomyelitis ([place])   . Blood poisoning ([place])   . Neutropenia ([place])   . Iron deficiency anemia due to chronic blood loss   . Leukopenia   . Ulcer of great toe ([place])   . Sepsis ([place]) 01/21/2015  . Leucopenia 01/21/2015  . Anemia 01/21/2015  . Syncope 01/21/2015  . Diarrhea 01/21/2015  . Hypokalemia 01/21/2015  . Loss of consciousness ([place])   . Eczema 10/21/2014  . Essential hypertension 10/21/2014  . Vasovagal syncope 10/21/2014  . Bilateral conjunctivitis 08/10/2014  . Other chronic pancreatitis ([place]) 08/10/2014  . COLD (chronic  obstructive lung disease) ([place]) 08/10/2014  . Chronic ethmoidal sinusitis 08/10/2014  . BRONCHITIS 10/17/2010  . PLANTAR FASCIITIS, LEFT 10/17/2010  . OTHER NEUTROPENIA 04/21/2010  . CALLUS, TOE 03/15/2010  . LEUKOPENIA, MILD 02/20/2010  . LIVER FUNCTION TESTS, ABNORMAL, HX OF 02/16/2010  . DENTAL CARIES 02/15/2010  . ONYCHOMYCOSIS 07/05/2009  . ALLERGIC URTICARIA 07/05/2009  . PAIN IN LIMB 07/05/2009  . ERECTILE DYSFUNCTION 05/25/2008  . INFLUENZA 05/25/2008  . VIRAL INFECTION 10/01/2007  . HSV 05/02/2007  . CONDYLOMA ACUMINATUM 05/02/2007  . HYPERTENSION 05/02/2007  . ALLERGIC RHINITIS 05/02/2007  . ASTHMA 05/02/2007  . GERD 05/02/2007  . PANCREATITIS 05/02/2007    Past Surgical History:  Procedure Laterality Date  . AMPUTATION Left 07/14/2014   Procedure: 2nd Toe Amputation;  Surgeon: [name], MD;  Location: [facility];  Service: Orthopedics;  Laterality: Left;  . AMPUTATION Left 11/16/2015   Procedure: Left Great Toe Amputation at Metatarsophalangeal Joint;  Surgeon: [name], MD;  Location: [facility];  Service: Orthopedics;  Laterality: Left;  . AMPUTATION Right 06/13/2016   Procedure: RIGHT 2nd Toe Amputation;  Surgeon: [name], MD;  Location: [facility];  Service: Orthopedics;  Laterality: Right;  . TOE AMPUTATION Left 11/16/2015   left great toe   . TOE AMPUTATION Right 06/13/2016  . TONSILLECTOMY         Home Medications  Prior to Admission medications   Medication Sig Start Date End Date Taking? Authorizing Provider  albuterol (VENTOLIN HFA) 108 (90 Base) MCG/ACT inhaler INHALE TWO PUFFS BY MOUTH EVERY 4 HOURS AS NEEDED FOR WHEEZING OR SHORTNESS OF BREATH 05/17/16   Tresa Garter, MD  allopurinol (ZYLOPRIM) 300 MG tablet Take 1 tablet (300 mg total) by mouth daily. 07/05/16   Chrystie Nose, NP  azithromycin (ZITHROMAX Z-PAK) 250 MG tablet Take 2 pills today, then 1 pill daily until gone. 07/17/16   Melony Overly, MD  Bioflavonoid Products (ESTER C  PO) Take 1 tablet by mouth daily.    Historical Provider, MD  clobetasol ointment (TEMOVATE) [id number] % Apply 1 application topically 2 (two) times daily. Patient taking differently: Apply 1 application topically 2 (two) times daily as needed (for skin irritation).  11/05/14   Olugbemiga Essie Christine, MD  COLCRYS 0.6 MG tablet Take 0.6 mg by mouth 2 (two) times daily as needed (gout flare).  06/04/16   Historical Provider, MD  cyclobenzaprine (FEXMID) 7.5 MG tablet Take 7.5 mg by mouth 4 (four) times daily as needed for muscle spasms.  05/11/16   Historical Provider, MD  Doxepin HCl 5 % CREA Apply 1 application topically daily as needed (itching).  05/11/16   Historical Provider, MD  fluticasone (FLONASE) 50 MCG/ACT nasal spray USE ONE SPRAY(S) IN EACH NOSTRIL ONCE DAILY 05/17/16   Tresa Garter, MD  Fluticasone-Salmeterol (ADVAIR) 500-50 MCG/DOSE AEPB Inhale 1 puff into the lungs 2 (two) times daily. 05/17/16   Tresa Garter, MD  hydrochlorothiazide (HYDRODIURIL) 25 MG tablet Take 1 tablet (25 mg total) by mouth daily. 05/17/16   Tresa Garter, MD  ibuprofen (ADVIL,MOTRIN) 800 MG tablet Take 800 mg by mouth every 8 (eight) hours as needed for moderate pain.     Historical Provider, MD  lidocaine (XYLOCAINE) 5 % ointment Apply 1 application topically daily as needed for moderate pain.  05/10/16   Historical Provider, MD  loperamide (IMODIUM A-D) 2 MG tablet Take 2 mg by mouth 4 (four) times daily as needed for diarrhea or loose stools.    Historical Provider, MD  montelukast (SINGULAIR) 10 MG tablet Take 1 tablet (10 mg total) by mouth at bedtime. 05/17/16   Tresa Garter, MD  Multiple Vitamin (MULTIVITAMIN WITH MINERALS) TABS tablet Take 1 tablet by mouth daily. Patient taking differently: Take 1 tablet by mouth 3 (three) times daily.  11/24/15   Tresa Garter, MD  Naphazoline-Pheniramine (EYE ALLERGY RELIEF OP) Place 1 drop into both eyes daily as needed (for allergy eyes).    Historical  Provider, MD  Neomy-Bacit-Polymyx-Pramoxine (TRIPLE ANTIBIOTIC+PAIN RELIEF EX) Apply 1 application topically daily as needed (apply to affected toe).    Historical Provider, MD  OVER THE COUNTER MEDICATION Take 1 capsule by mouth 3 (three) times daily with meals. Blue/green algae    Historical Provider, MD  oxyCODONE-acetaminophen (PERCOCET/ROXICET) 5-325 MG tablet Take 1 tablet by mouth every 4 (four) hours as needed for severe pain. 06/14/16   Chrystie Nose, NP  oxyCODONE-acetaminophen (ROXICET) 5-325 MG tablet Take 1 tablet by mouth every 8 (eight) hours as needed for severe pain. 07/05/16   Chrystie Nose, NP  Pancrelipase, Lip-Prot-Amyl, (CREON) 24000-76000 units CPEP Take 1 capsule (24,000 Units total) by mouth daily. 05/17/16   Tresa Garter, MD  pantoprazole (PROTONIX) 40 MG tablet Take 1 tablet (40 mg total) by mouth daily. 05/17/16   Tresa Garter, MD  predniSONE (DELTASONE)  50 MG tablet Take 1 pill daily for 5 days. 07/17/16   Melony Overly, MD  terbinafine (LAMISIL AT) 1 % cream Apply 1 application topically 2 (two) times daily. Patient taking differently: Apply 1 application topically 2 (two) times daily as needed (rash).  05/17/16   Tresa Garter, MD    Family History Family History  Problem Relation Age of Onset  . Adopted: Yes    Social History Social History  Substance Use Topics  . Smoking status: Never Smoker  . Smokeless tobacco: Never Used  . Alcohol use No     Allergies   Ciprofloxacin and Sulfa antibiotics   Review of Systems Review of Systems As in history of present illness  Physical Exam Triage Vital Signs ED Triage Vitals  Enc Vitals Group     BP 07/17/16 2049 132/78     Pulse Rate 07/17/16 2049 78     Resp 07/17/16 2049 18     Temp 07/17/16 2049 98.6 F (37 C)     Temp Source 07/17/16 2049 Oral     SpO2 07/17/16 2049 98 %     Weight --      Height --      Head Circumference --      Peak Flow --      Pain Score 07/17/16  2050 2     Pain Loc --      Pain Edu? --      Excl. in [place]? --    No data found.   Updated Vital Signs BP 132/78 (BP Location: Right Arm)   Pulse 78   Temp 98.6 F (37 C) (Oral)   Resp 18   SpO2 98%   Visual Acuity Right Eye Distance:   Left Eye Distance:   Bilateral Distance:    Right Eye Near:   Left Eye Near:    Bilateral Near:     Physical Exam  Constitutional: He is oriented to person, place, and time. He appears well-developed and well-nourished. No distress.  HENT:  Nose: Nose normal.  Mouth/Throat: Oropharynx is clear and moist. No oropharyngeal exudate.  Neck: Neck supple.  Cardiovascular: Normal rate, regular rhythm and normal heart sounds.   No murmur heard. Pulmonary/Chest: Effort normal and breath sounds normal. No respiratory distress. He has no wheezes. He has no rales.  Lymphadenopathy:    He has no cervical adenopathy.  Neurological: He is alert and oriented to person, place, and time.     UC Treatments / Results  Labs (all labs ordered are listed, but only abnormal results are displayed) Labs Reviewed - No data to display  EKG  EKG Interpretation None       Radiology No results found.  Procedures Procedures (including critical care time)  Medications Ordered in UC Medications - No data to display   Initial Impression / Assessment and Plan / UC Course  I have reviewed the triage vital signs and the nursing notes.  Pertinent labs & imaging results that were available during my care of the patient were reviewed by me and considered in my medical decision making (see chart for details).  Clinical Course     We'll treat with azithromycin and prednisone. Continue home medications as prescribed. Follow-up as needed.  Final Clinical Impressions(s) / UC Diagnoses   Final diagnoses:  Bronchitis    New Prescriptions New Prescriptions   AZITHROMYCIN (ZITHROMAX Z-PAK) 250 MG TABLET    Take 2 pills today, then 1 pill daily until  gone.  PREDNISONE (DELTASONE) 50 MG TABLET    Take 1 pill daily for 5 days.     Melony Overly, MD 07/17/16 2108

## 2016-07-17 NOTE — ED Triage Notes (Signed)
PT  HAS  SYMPYTOMS  OF   SINUS  DRAINAGE /  CONGESTION       SYMPTOMS  RELEIVED  SOME BY OTC  MEDS  AND  INHALERS      SYMPTOMS  X   3   WEEKS

## 2016-07-27 ENCOUNTER — Ambulatory Visit (INDEPENDENT_AMBULATORY_CARE_PROVIDER_SITE_OTHER): Payer: BLUE CROSS/BLUE SHIELD | Admitting: Family

## 2016-07-27 ENCOUNTER — Other Ambulatory Visit: Payer: Self-pay | Admitting: Internal Medicine

## 2016-07-27 DIAGNOSIS — M2142 Flat foot [pes planus] (acquired), left foot: Secondary | ICD-10-CM

## 2016-07-27 DIAGNOSIS — K861 Other chronic pancreatitis: Secondary | ICD-10-CM

## 2016-07-27 DIAGNOSIS — M76829 Posterior tibial tendinitis, unspecified leg: Secondary | ICD-10-CM

## 2016-07-27 DIAGNOSIS — S98131A Complete traumatic amputation of one right lesser toe, initial encounter: Secondary | ICD-10-CM

## 2016-07-27 MED ORDER — PANCRELIPASE (LIP-PROT-AMYL) 24000-76000 UNITS PO CPEP: 24000.0000 [IU] | ORAL_CAPSULE | Freq: Three times a day (TID) | ORAL | 11 refills | Status: DC

## 2016-07-27 NOTE — Progress Notes (Signed)
Office Visit Note   Patient: Stephen Whitaker           Date of Birth: 21-Apr-1956           MRN: [id number] Visit Date: 07/27/2016              Requested by: Tresa Garter, MD [address] [place], [place] [address] PCP: Angelica Chessman, MD   Assessment & Plan: Visit Diagnoses:  1. Posterior tibial tendon dysfunction   2. Amputated toe, right ([place])     Plan: Have provided him with a posterior tibial tendon brace for the left foot. May resume regular shoewear on the right. Weightbearing as tolerated.   Follow-Up Instructions: Return in about 3 weeks (around 08/17/2016).   Orders:  No orders of the defined types were placed in this encounter.  No orders of the defined types were placed in this encounter.     Procedures: No procedures performed   Clinical Data: No additional findings.   Subjective: No chief complaint on file.   Patient is a 60 year old gentleman who is seen today in follow up for 2 separate issues  1. Right foot - is s/p a right foot 2nd ray amputation which is well healed. He is full weight bearing in a regular shoe. 2. Left foot and ankle pain and swelling. Does have history of gout in the ankle as well as posterior tibial tendon insufficiency on the left. Complains of pain with ambulation on the left. Swelling that is intermittent to the left ankle. Denies this foot ever getting red or warm.   Does state is getting better every day. Feels will be ready to return to work soon.   Also complaining of left ankle pain. States has been told in the past that he has posterior tibial tendinitis. This continues to bother him feel the left foot and ankle are unstable. Pain with ambulation. This is a chronic issue with waxing and waning symptoms.  Review of Systems  Constitutional: Negative for chills and fever.     Objective: Vital Signs: There were no vitals taken for this visit.  Physical Exam  Constitutional: He is oriented to person,  place, and time. He appears well-developed and well-nourished.  Pulmonary/Chest: Effort normal.  Musculoskeletal:       Right foot: There is normal range of motion and no swelling.  Well healed. No open areas. No sign of infection.  Neurological: He is alert and oriented to person, place, and time.  Psychiatric: He has a normal mood and affect.  Nursing note reviewed.   Left Ankle Exam  Swelling: moderate  Tenderness  Left ankle tenderness location: Tender along the posterior tibial tendon, unable to perform single limb heel raise.   Range of Motion  The patient has normal left ankle ROM.      right foot: The second ray amputation is well healed. There are no open areas noted drainage no erythema or sign of infection.  Specialty Comments:  No specialty comments available.  Imaging: No results found.   PMFS History: Patient Active Problem List   Diagnosis Date Noted  . Amputated toe, right ([place]) 06/13/2016  . Chronic osteomyelitis of left foot ([place]) 11/24/2015  . Chronic bronchitis ([place]) 11/24/2015  . Osteomyelitis of toe of left foot ([place]) 11/16/2015  . Other pancytopenia ([place]) 01/24/2015  . Chronic pancreatitis ([place]) 01/24/2015  . Osteomyelitis ([place])   . Blood poisoning ([place])   . Neutropenia ([place])   . Iron deficiency  anemia due to chronic blood loss   . Leukopenia   . Ulcer of great toe ([place])   . Sepsis ([place]) 01/21/2015  . Leucopenia 01/21/2015  . Anemia 01/21/2015  . Syncope 01/21/2015  . Diarrhea 01/21/2015  . Hypokalemia 01/21/2015  . Loss of consciousness ([employer] [facility])   . Eczema 10/21/2014  . Essential hypertension 10/21/2014  . Vasovagal syncope 10/21/2014  . Bilateral conjunctivitis 08/10/2014  . Other chronic pancreatitis ([place]) 08/10/2014  . COLD (chronic obstructive lung disease) ([place]) 08/10/2014  . Chronic ethmoidal sinusitis 08/10/2014  . BRONCHITIS 10/17/2010  . PLANTAR FASCIITIS, LEFT 10/17/2010  . OTHER NEUTROPENIA 04/21/2010  . CALLUS, TOE  03/15/2010  . LEUKOPENIA, MILD 02/20/2010  . LIVER FUNCTION TESTS, ABNORMAL, HX OF 02/16/2010  . DENTAL CARIES 02/15/2010  . ONYCHOMYCOSIS 07/05/2009  . ALLERGIC URTICARIA 07/05/2009  . PAIN IN LIMB 07/05/2009  . ERECTILE DYSFUNCTION 05/25/2008  . INFLUENZA 05/25/2008  . VIRAL INFECTION 10/01/2007  . HSV 05/02/2007  . CONDYLOMA ACUMINATUM 05/02/2007  . HYPERTENSION 05/02/2007  . ALLERGIC RHINITIS 05/02/2007  . ASTHMA 05/02/2007  . GERD 05/02/2007  . PANCREATITIS 05/02/2007   Past Medical History:  Diagnosis Date  . Arthritis   . Chronic bronchitis ([place])   . COPD (chronic obstructive pulmonary disease) ([facility])    Chronic Bronchitis  . Dyspnea   . GERD (gastroesophageal reflux disease)   . Gout   . Herpes genitalia   . Hypertension   . Osteomyelitis ([place]) 05/2016   Osteomyelitis  of right 2nd toe  . Pancreatitis, acute   . Pneumonia 2016    Family History  Problem Relation Age of Onset  . Adopted: Yes    Past Surgical History:  Procedure Laterality Date  . AMPUTATION Left 07/14/2014   Procedure: 2nd Toe Amputation;  Surgeon: [name], MD;  Location: [facility];  Service: Orthopedics;  Laterality: Left;  . AMPUTATION Left 11/16/2015   Procedure: Left Great Toe Amputation at Metatarsophalangeal Joint;  Surgeon: [name], MD;  Location: [facility];  Service: Orthopedics;  Laterality: Left;  . AMPUTATION Right 06/13/2016   Procedure: RIGHT 2nd Toe Amputation;  Surgeon: [name], MD;  Location: [facility];  Service: Orthopedics;  Laterality: Right;  . TOE AMPUTATION Left 11/16/2015   left great toe   . TOE AMPUTATION Right 06/13/2016  . TONSILLECTOMY     Social History   Occupational History  . Not on file.   Social History Main Topics  . Smoking status: Never Smoker  . Smokeless tobacco: Never Used  . Alcohol use No  . Drug use: No  . Sexual activity: Not on file

## 2016-07-30 NOTE — Telephone Encounter (Signed)
Patient is requesting a change in his work note. Can you take out "alternate sitting / standing" to just light duty.  Cb# [phone]

## 2016-07-31 NOTE — Telephone Encounter (Signed)
Faxed letter to [phone]

## 2016-08-01 ENCOUNTER — Telehealth (INDEPENDENT_AMBULATORY_CARE_PROVIDER_SITE_OTHER): Payer: Self-pay | Admitting: *Deleted

## 2016-08-01 NOTE — Telephone Encounter (Signed)
Faxed note to [phone]

## 2016-08-01 NOTE — Telephone Encounter (Signed)
Pt stated insurance company needs last office note faxed to [phone].

## 2016-08-02 ENCOUNTER — Other Ambulatory Visit: Payer: Self-pay | Admitting: Internal Medicine

## 2016-08-02 ENCOUNTER — Telehealth (INDEPENDENT_AMBULATORY_CARE_PROVIDER_SITE_OTHER): Payer: Self-pay | Admitting: Orthopedic Surgery

## 2016-08-02 MED ORDER — IBUPROFEN 800 MG PO TABS: 800.0000 mg | ORAL_TABLET | Freq: Three times a day (TID) | ORAL | 0 refills | Status: DC | PRN

## 2016-08-02 MED ORDER — ALLOPURINOL 300 MG PO TABS: 300.0000 mg | ORAL_TABLET | Freq: Every day | ORAL | 2 refills | Status: DC

## 2016-08-02 NOTE — Telephone Encounter (Signed)
Patient called needing Rx's refilled ( Allopurinol, Ibuprofen, and Oxycodone) Patient said he use the [facility] on [address]. The number is [phone]. The number to contact him is [phone]

## 2016-08-02 NOTE — Telephone Encounter (Signed)
I called and advised patient per Junie Panning rx for oxycodone is inappropriate refill, he should no longer need this medication. He expressed understanding. The allopurinol and ibuprofen sent into pharmacy, advised patient of this as well.

## 2016-08-03 ENCOUNTER — Telehealth (INDEPENDENT_AMBULATORY_CARE_PROVIDER_SITE_OTHER): Payer: Self-pay | Admitting: Orthopedic Surgery

## 2016-08-07 ENCOUNTER — Telehealth (INDEPENDENT_AMBULATORY_CARE_PROVIDER_SITE_OTHER): Payer: Self-pay | Admitting: Family

## 2016-08-07 NOTE — Telephone Encounter (Signed)
This has been done.

## 2016-08-17 ENCOUNTER — Ambulatory Visit (INDEPENDENT_AMBULATORY_CARE_PROVIDER_SITE_OTHER): Payer: BLUE CROSS/BLUE SHIELD | Admitting: Orthopedic Surgery

## 2016-08-17 ENCOUNTER — Telehealth (INDEPENDENT_AMBULATORY_CARE_PROVIDER_SITE_OTHER): Payer: Self-pay | Admitting: Orthopedic Surgery

## 2016-08-17 NOTE — Telephone Encounter (Signed)
Pt requesting if his rx for ibuprofen can be for 90 pills and not 30. erin wrote. To [facility] [address] Pt number is [phone]

## 2016-08-20 ENCOUNTER — Other Ambulatory Visit (INDEPENDENT_AMBULATORY_CARE_PROVIDER_SITE_OTHER): Payer: Self-pay | Admitting: Family

## 2016-08-21 ENCOUNTER — Other Ambulatory Visit (INDEPENDENT_AMBULATORY_CARE_PROVIDER_SITE_OTHER): Payer: Self-pay | Admitting: Family

## 2016-08-21 MED ORDER — IBUPROFEN 800 MG PO TABS: 800.0000 mg | ORAL_TABLET | Freq: Three times a day (TID) | ORAL | 0 refills | Status: DC | PRN

## 2016-08-21 NOTE — Telephone Encounter (Signed)
done

## 2016-08-23 ENCOUNTER — Ambulatory Visit (INDEPENDENT_AMBULATORY_CARE_PROVIDER_SITE_OTHER): Payer: BLUE CROSS/BLUE SHIELD | Admitting: Orthopedic Surgery

## 2016-08-28 ENCOUNTER — Encounter (INDEPENDENT_AMBULATORY_CARE_PROVIDER_SITE_OTHER): Payer: Self-pay

## 2016-08-28 ENCOUNTER — Ambulatory Visit (INDEPENDENT_AMBULATORY_CARE_PROVIDER_SITE_OTHER): Payer: BLUE CROSS/BLUE SHIELD | Admitting: Orthopedic Surgery

## 2016-09-10 ENCOUNTER — Telehealth: Payer: Self-pay | Admitting: Internal Medicine

## 2016-09-10 ENCOUNTER — Encounter (HOSPITAL_COMMUNITY): Payer: Self-pay | Admitting: Emergency Medicine

## 2016-09-10 ENCOUNTER — Ambulatory Visit (INDEPENDENT_AMBULATORY_CARE_PROVIDER_SITE_OTHER): Payer: BLUE CROSS/BLUE SHIELD | Admitting: Family

## 2016-09-10 ENCOUNTER — Ambulatory Visit (INDEPENDENT_AMBULATORY_CARE_PROVIDER_SITE_OTHER): Payer: Self-pay

## 2016-09-10 ENCOUNTER — Ambulatory Visit (HOSPITAL_COMMUNITY)
Admission: EM | Admit: 2016-09-10 | Discharge: 2016-09-10 | Disposition: A | Discharge status: Home / Self Care | Payer: BLUE CROSS/BLUE SHIELD | Attending: Family Medicine | Admitting: Family Medicine

## 2016-09-10 VITALS — Ht 74.0 in | Wt 194.0 lb

## 2016-09-10 DIAGNOSIS — M79675 Pain in left toe(s): Secondary | ICD-10-CM

## 2016-09-10 DIAGNOSIS — B9789 Other viral agents as the cause of diseases classified elsewhere: Secondary | ICD-10-CM

## 2016-09-10 DIAGNOSIS — J069 Acute upper respiratory infection, unspecified: Secondary | ICD-10-CM

## 2016-09-10 MED ORDER — IPRATROPIUM BROMIDE 0.06 % NA SOLN: 2.0000 | Freq: Four times a day (QID) | NASAL | 12 refills | Status: DC

## 2016-09-10 MED ORDER — PREDNISONE 10 MG (21) PO TBPK: 20.0000 mg | ORAL_TABLET | Freq: Two times a day (BID) | ORAL | 2 refills | Status: DC

## 2016-09-10 MED ORDER — BENZONATATE 100 MG PO CAPS: 100.0000 mg | ORAL_CAPSULE | Freq: Three times a day (TID) | ORAL | 0 refills | Status: DC

## 2016-09-10 NOTE — ED Triage Notes (Signed)
Onset Friday of symptoms: sneezing, coughing, blood streak secretions from nose.

## 2016-09-10 NOTE — Telephone Encounter (Signed)
Patient came to the office to request medication refill for predniSONE (DELTASONE) 50 MG tablet  And Vpack. Please call it in to [facility] on [address].   Thank you.

## 2016-09-10 NOTE — ED Provider Notes (Signed)
CSN: [id number]     Arrival date & time 09/10/16  1756 History   None    Chief Complaint  Patient presents with  . URI   (Consider location/radiation/quality/duration/timing/severity/associated sxs/prior Treatment) 61 year old male presents with chief complaint of cough, sinus congestion, and sore throat for 4 days. He reports his mucus has been clear but has had some blood tinged streaks in it. He denies sinus pain, sinus pressure, dental pain, ear pain, body aches, muscle aches, or fever. His cough is dry, non-productive, and hacking.   The history is provided by the patient.  URI    Past Medical History:  Diagnosis Date  . Arthritis   . Chronic bronchitis ([place])   . COPD (chronic obstructive pulmonary disease) (HCC)    Chronic Bronchitis  . Dyspnea   . GERD (gastroesophageal reflux disease)   . Gout   . Herpes genitalia   . Hypertension   . Osteomyelitis ([place]) 05/2016   Osteomyelitis  of right 2nd toe  . Pancreatitis, acute   . Pneumonia 2016   Past Surgical History:  Procedure Laterality Date  . AMPUTATION Left 07/14/2014   Procedure: 2nd Toe Amputation;  Surgeon: [name], MD;  Location: [facility];  Service: Orthopedics;  Laterality: Left;  . AMPUTATION Left 11/16/2015   Procedure: Left Great Toe Amputation at Metatarsophalangeal Joint;  Surgeon: [name], MD;  Location: [place];  Service: Orthopedics;  Laterality: Left;  . AMPUTATION Right 06/13/2016   Procedure: RIGHT 2nd Toe Amputation;  Surgeon: [name], MD;  Location: [facility];  Service: Orthopedics;  Laterality: Right;  . TOE AMPUTATION Left 11/16/2015   left great toe   . TOE AMPUTATION Right 06/13/2016  . TONSILLECTOMY     Family History  Problem Relation Age of Onset  . Adopted: Yes   Social History  Substance Use Topics  . Smoking status: Never Smoker  . Smokeless tobacco: Never Used  . Alcohol use No    Review of Systems  Reason unable to perform ROS: as covered in HPI.  All other systems  reviewed and are negative.   Allergies  Ciprofloxacin and Sulfa antibiotics  Home Medications   Prior to Admission medications   Medication Sig Start Date End Date Taking? Authorizing Provider  albuterol (VENTOLIN HFA) 108 (90 Base) MCG/ACT inhaler INHALE TWO PUFFS BY MOUTH EVERY 4 HOURS AS NEEDED FOR WHEEZING OR SHORTNESS OF BREATH 05/17/16   Tresa Garter, MD  allopurinol (ZYLOPRIM) 300 MG tablet Take 1 tablet (300 mg total) by mouth daily. 08/02/16   Suzan Slick, NP  azithromycin (ZITHROMAX Z-PAK) 250 MG tablet Take 2 pills today, then 1 pill daily until gone. 07/17/16   Melony Overly, MD  benzonatate (TESSALON) 100 MG capsule Take 1 capsule (100 mg total) by mouth every 8 (eight) hours. 09/10/16   Barnet Glasgow, NP  Bioflavonoid Products (ESTER C PO) Take 1 tablet by mouth daily.    Historical Provider, MD  clobetasol ointment (TEMOVATE) AB-123456789 % Apply 1 application topically 2 (two) times daily. Patient taking differently: Apply 1 application topically 2 (two) times daily as needed (for skin irritation).  11/05/14   Olugbemiga Essie Christine, MD  COLCRYS 0.6 MG tablet Take 0.6 mg by mouth 2 (two) times daily as needed (gout flare).  06/04/16   Historical Provider, MD  cyclobenzaprine (FEXMID) 7.5 MG tablet Take 7.5 mg by mouth 4 (four) times daily as needed for muscle spasms.  05/11/16   Historical Provider,  MD  Doxepin HCl 5 % CREA Apply 1 application topically daily as needed (itching).  05/11/16   Historical Provider, MD  fluticasone (FLONASE) 50 MCG/ACT nasal spray USE ONE SPRAY(S) IN EACH NOSTRIL ONCE DAILY 05/17/16   Tresa Garter, MD  Fluticasone-Salmeterol (ADVAIR) 500-50 MCG/DOSE AEPB Inhale 1 puff into the lungs 2 (two) times daily. 05/17/16   Tresa Garter, MD  hydrochlorothiazide (HYDRODIURIL) 25 MG tablet Take 1 tablet (25 mg total) by mouth daily. 05/17/16   Tresa Garter, MD  ibuprofen (ADVIL,MOTRIN) 800 MG tablet Take 1 tablet (800 mg total) by mouth every 8  (eight) hours as needed for moderate pain. 08/21/16   Suzan Slick, NP  ipratropium (ATROVENT) 0.06 % nasal spray Place 2 sprays into both nostrils 4 (four) times daily. 09/10/16   Barnet Glasgow, NP  lidocaine (XYLOCAINE) 5 % ointment Apply 1 application topically daily as needed for moderate pain.  05/10/16   Historical Provider, MD  loperamide (IMODIUM A-D) 2 MG tablet Take 2 mg by mouth 4 (four) times daily as needed for diarrhea or loose stools.    Historical Provider, MD  montelukast (SINGULAIR) 10 MG tablet Take 1 tablet (10 mg total) by mouth at bedtime. 05/17/16   Tresa Garter, MD  Multiple Vitamin (MULTIVITAMIN WITH MINERALS) TABS tablet Take 1 tablet by mouth daily. Patient taking differently: Take 1 tablet by mouth 3 (three) times daily.  11/24/15   Tresa Garter, MD  Naphazoline-Pheniramine (EYE ALLERGY RELIEF OP) Place 1 drop into both eyes daily as needed (for allergy eyes).    Historical Provider, MD  Neomy-Bacit-Polymyx-Pramoxine (TRIPLE ANTIBIOTIC+PAIN RELIEF EX) Apply 1 application topically daily as needed (apply to affected toe).    Historical Provider, MD  OVER THE COUNTER MEDICATION Take 1 capsule by mouth 3 (three) times daily with meals. Blue/green algae    Historical Provider, MD  oxyCODONE-acetaminophen (PERCOCET/ROXICET) 5-325 MG tablet Take 1 tablet by mouth every 4 (four) hours as needed for severe pain. 06/14/16   Suzan Slick, NP  oxyCODONE-acetaminophen (ROXICET) 5-325 MG tablet Take 1 tablet by mouth every 8 (eight) hours as needed for severe pain. 07/05/16   Suzan Slick, NP  Pancrelipase, Lip-Prot-Amyl, (CREON) 24000-76000 units CPEP Take 1 capsule (24,000 Units total) by mouth 3 (three) times daily. 07/27/16   Tresa Garter, MD  pantoprazole (PROTONIX) 40 MG tablet Take 1 tablet (40 mg total) by mouth daily. 05/17/16   Tresa Garter, MD  predniSONE (DELTASONE) 50 MG tablet Take 1 pill daily for 5 days. 07/17/16   Melony Overly, MD  predniSONE  (STERAPRED UNI-PAK 21 TAB) 10 MG (21) TBPK tablet Take 2 tablets (20 mg total) by mouth 2 (two) times daily. 09/10/16   Barnet Glasgow, NP  terbinafine (LAMISIL AT) 1 % cream Apply 1 application topically 2 (two) times daily. Patient taking differently: Apply 1 application topically 2 (two) times daily as needed (rash).  05/17/16   Tresa Garter, MD  VENTOLIN HFA 108 (90 Base) MCG/ACT inhaler INHALE TWO PUFFS INTO THE LUNGS EVERY 4 HOURS AS NEEDED FOR WHEEZING OR SHORTNESS OF BREATH 08/03/16   Tresa Garter, MD   Meds Ordered and Administered this Visit  Medications - No data to display  BP 151/80 (BP Location: Right Arm)   Pulse 91   Temp 98.2 F (36.8 C) (Oral)   Resp 22   SpO2 98%  No data found.   Physical Exam  Constitutional: He is oriented to  person, place, and time. He appears well-developed and well-nourished. No distress.  HENT:  Head: Normocephalic and atraumatic.  Right Ear: Tympanic membrane and external ear normal.  Left Ear: Tympanic membrane and external ear normal.  Nose: Rhinorrhea present. No epistaxis. Right sinus exhibits no maxillary sinus tenderness and no frontal sinus tenderness. Left sinus exhibits no maxillary sinus tenderness and no frontal sinus tenderness.  Mouth/Throat: Uvula is midline and oropharynx is clear and moist. No oropharyngeal exudate.  Eyes: Pupils are equal, round, and reactive to light.  Neck: Normal range of motion. Neck supple. No JVD present.  Cardiovascular: Normal rate and regular rhythm.   Pulmonary/Chest: Effort normal and breath sounds normal. No respiratory distress. He has no wheezes.  Abdominal: Soft. Bowel sounds are normal.  Lymphadenopathy:    He has no cervical adenopathy.  Neurological: He is alert and oriented to person, place, and time.  Skin: Skin is warm and dry. Capillary refill takes less than 2 seconds. No rash noted. He is not diaphoretic. No erythema.  Psychiatric: He has a normal mood and affect.   Nursing note and vitals reviewed.   Urgent Care Course     Procedures (including critical care time)  Labs Review Labs Reviewed - No data to display  Imaging Review No results found.   Visual Acuity Review  Right Eye Distance:   Left Eye Distance:   Bilateral Distance:    Right Eye Near:   Left Eye Near:    Bilateral Near:         MDM   1. Viral URI with cough   You most likely have a viral URI, I advise rest, plenty of fluids and management of symptoms with over the counter medicines. For symptoms you may take Tylenol as needed every 4-6 hours for body aches or fever, not to exceed 4,000 mg a day, Take mucinex or mucinex DM ever 12 hours with a full glass of water, you may use an inhaled steroid such as Flonase, 2 sprays each nostril once a day for congestion, or an antihistamine such as Claritin or Zyrtec once a day. In addition to these therapies, I have prescribed prednisone, take 2 tablets twice a day for 6 days, ipratropium nasal spray, two sprays each nostril 4 times a day, and tessalon for cough, 1 tablet 3 times a day as needed.  Should your symptoms worsen or fail to resolve, follow up with your primary care provider or return to clinic.      Barnet Glasgow, NP 09/10/16 [phone]

## 2016-09-10 NOTE — Progress Notes (Signed)
Office Visit Note   Patient: Stephen Whitaker           Date of Birth: 03-Nov-1955           MRN: [id number] Visit Date: 09/10/2016              Requested by: Tresa Garter, MD [address] [place], [place] [address] PCP: Angelica Chessman, MD  Chief Complaint  Patient presents with  . Left Foot - Pain  . Right Foot - Pain    HPI: Patient complains of bilateral feet second toe "bruise" states that he noticed this about two days ago and does not report any injury. He states that he is concerned due to his previous amputations and that he is concerned. The toes are clawing and there are no open areas and the pt is treating this with a dry dressing. He is full weight bearing. Pamella Pert, RMA  The patient is a 61 year old gentleman who is seen today for evaluation of discoloration to the left 3rd toe tip. This is been ongoing for 2 days. Has been covering with a dry dressing. There is no drainage or odor. Has been full weightbearing in regular shoe wear. States further work he has been using steel caps for his shoes. Has worn his steel boots feels these are too tight and may have caused the ulceration.    Assessment & Plan: Visit Diagnoses:  1. Toe pain, left     Plan: Silvadene dressings to the distal aspect of the third toe. Follow-up in office in 2 weeks. Him will discontinue his steel toed boots. Recommended that he wears steel caps over his diabetic extra depth shoes.  Follow-Up Instructions: Return in about 2 weeks (around 09/24/2016).   Ortho Exam Physical Exam  Constitutional: Appears well-developed.  Head: Normocephalic.  Eyes: EOM are normal.  Neck: Normal range of motion.  Cardiovascular: Normal rate.   Pulmonary/Chest: Effort normal.  Neurological: Is alert.  Skin: Skin is warm.  Psychiatric: Has a normal mood and affect. Left foot third toe distal tip with eschar. There is no swelling no erythema to the toe no drainage no sign of infection. No open  ulceration.  Imaging: No results found.  Orders:  Orders Placed This Encounter  Procedures  . XR Toe 2nd Left   No orders of the defined types were placed in this encounter.    Procedures: No procedures performed  Clinical Data: No additional findings.  Subjective: Review of Systems  Constitutional: Negative for chills and fever.  Skin: Negative for wound.    Objective: Vital Signs: Ht 6\' 2"  (1.88 m)   Wt 194 lb (88 kg)   BMI 24.91 kg/m   Specialty Comments:  No specialty comments available.  PMFS History: Patient Active Problem List   Diagnosis Date Noted  . Amputated toe, right ([place]) 06/13/2016  . Chronic osteomyelitis of left foot ([place]) 11/24/2015  . Chronic bronchitis ([place]) 11/24/2015  . Osteomyelitis of toe of left foot ([place]) 11/16/2015  . Other pancytopenia ([place]) 01/24/2015  . Chronic pancreatitis ([place]) 01/24/2015  . Osteomyelitis ([place])   . Blood poisoning ([place])   . Neutropenia ([place])   . Iron deficiency anemia due to chronic blood loss   . Leukopenia   . Ulcer of great toe ([name])   . Sepsis ([place]) 01/21/2015  . Leucopenia 01/21/2015  . Anemia 01/21/2015  . Syncope 01/21/2015  . Diarrhea 01/21/2015  . Hypokalemia 01/21/2015  . Loss of consciousness ([place])   .  Eczema 10/21/2014  . Essential hypertension 10/21/2014  . Vasovagal syncope 10/21/2014  . Bilateral conjunctivitis 08/10/2014  . Other chronic pancreatitis ([place]) 08/10/2014  . COLD (chronic obstructive lung disease) ([place]) 08/10/2014  . Chronic ethmoidal sinusitis 08/10/2014  . BRONCHITIS 10/17/2010  . PLANTAR FASCIITIS, LEFT 10/17/2010  . OTHER NEUTROPENIA 04/21/2010  . CALLUS, TOE 03/15/2010  . LEUKOPENIA, MILD 02/20/2010  . LIVER FUNCTION TESTS, ABNORMAL, HX OF 02/16/2010  . DENTAL CARIES 02/15/2010  . ONYCHOMYCOSIS 07/05/2009  . ALLERGIC URTICARIA 07/05/2009  . PAIN IN LIMB 07/05/2009  . ERECTILE DYSFUNCTION 05/25/2008  . INFLUENZA 05/25/2008  . VIRAL INFECTION 10/01/2007  . HSV  05/02/2007  . CONDYLOMA ACUMINATUM 05/02/2007  . HYPERTENSION 05/02/2007  . ALLERGIC RHINITIS 05/02/2007  . ASTHMA 05/02/2007  . GERD 05/02/2007  . PANCREATITIS 05/02/2007   Past Medical History:  Diagnosis Date  . Arthritis   . Chronic bronchitis ([place])   . COPD (chronic obstructive pulmonary disease) (HCC)    Chronic Bronchitis  . Dyspnea   . GERD (gastroesophageal reflux disease)   . Gout   . Herpes genitalia   . Hypertension   . Osteomyelitis ([place]) 05/2016   Osteomyelitis  of right 2nd toe  . Pancreatitis, acute   . Pneumonia 2016    Family History  Problem Relation Age of Onset  . Adopted: Yes    Past Surgical History:  Procedure Laterality Date  . AMPUTATION Left 07/14/2014   Procedure: 2nd Toe Amputation;  Surgeon: [name], MD;  Location: [facility];  Service: Orthopedics;  Laterality: Left;  . AMPUTATION Left 11/16/2015   Procedure: Left Great Toe Amputation at Metatarsophalangeal Joint;  Surgeon: [name], MD;  Location: [facility];  Service: Orthopedics;  Laterality: Left;  . AMPUTATION Right 06/13/2016   Procedure: RIGHT 2nd Toe Amputation;  Surgeon: [name], MD;  Location: [facility];  Service: Orthopedics;  Laterality: Right;  . TOE AMPUTATION Left 11/16/2015   left great toe   . TOE AMPUTATION Right 06/13/2016  . TONSILLECTOMY     Social History   Occupational History  . Not on file.   Social History Main Topics  . Smoking status: Never Smoker  . Smokeless tobacco: Never Used  . Alcohol use No  . Drug use: No  . Sexual activity: Not on file

## 2016-09-10 NOTE — Discharge Instructions (Signed)
You most likely have a viral URI, I advise rest, plenty of fluids and management of symptoms with over the counter medicines. For symptoms you may take Tylenol as needed every 4-6 hours for body aches or fever, not to exceed 4,000 mg a day, Take mucinex or mucinex DM ever 12 hours with a full glass of water, you may use an inhaled steroid such as Flonase, 2 sprays each nostril once a day for congestion, or an antihistamine such as Claritin or Zyrtec once a day. In addition to these therapies, I have prescribed prednisone, take 2 tablets twice a day for 6 days, ipratropium nasal spray, two sprays each nostril 4 times a day, and tessalon for cough, 1 tablet 3 times a day as needed.  Should your symptoms worsen or fail to resolve, follow up with your primary care provider or return to clinic.

## 2016-09-11 NOTE — Telephone Encounter (Signed)
Patient was given prednisone in the ED yesterday. Will disregard this request.

## 2016-09-20 ENCOUNTER — Ambulatory Visit: Payer: BLUE CROSS/BLUE SHIELD | Attending: Internal Medicine | Admitting: Physician Assistant

## 2016-09-20 ENCOUNTER — Encounter: Payer: Self-pay | Admitting: Physician Assistant

## 2016-09-20 VITALS — BP 158/93 | HR 86 | Temp 98.8°F | Resp 16 | Wt 202.6 lb

## 2016-09-20 DIAGNOSIS — J40 Bronchitis, not specified as acute or chronic: Secondary | ICD-10-CM | POA: Diagnosis not present

## 2016-09-20 DIAGNOSIS — M869 Osteomyelitis, unspecified: Secondary | ICD-10-CM | POA: Insufficient documentation

## 2016-09-20 DIAGNOSIS — R7989 Other specified abnormal findings of blood chemistry: Secondary | ICD-10-CM | POA: Diagnosis not present

## 2016-09-20 DIAGNOSIS — J069 Acute upper respiratory infection, unspecified: Secondary | ICD-10-CM | POA: Insufficient documentation

## 2016-09-20 DIAGNOSIS — M109 Gout, unspecified: Secondary | ICD-10-CM | POA: Diagnosis not present

## 2016-09-20 DIAGNOSIS — I10 Essential (primary) hypertension: Secondary | ICD-10-CM | POA: Diagnosis not present

## 2016-09-20 DIAGNOSIS — E876 Hypokalemia: Secondary | ICD-10-CM | POA: Insufficient documentation

## 2016-09-20 DIAGNOSIS — J449 Chronic obstructive pulmonary disease, unspecified: Secondary | ICD-10-CM | POA: Diagnosis not present

## 2016-09-20 DIAGNOSIS — K219 Gastro-esophageal reflux disease without esophagitis: Secondary | ICD-10-CM | POA: Diagnosis not present

## 2016-09-20 LAB — CBC WITH DIFFERENTIAL/PLATELET
BASOS ABS: 55 {cells}/uL (ref 0–200)
BASOS PCT: 1 %
Eosinophils Absolute: 0 cells/uL — ABNORMAL LOW (ref 15–500)
Eosinophils Relative: 0 %
HCT: 38.5 % (ref 38.5–50.0)
Hemoglobin: 12.2 g/dL — ABNORMAL LOW (ref 13.2–17.1)
LYMPHS PCT: 18 %
Lymphs Abs: 990 cells/uL (ref 850–3900)
MCH: 27.8 pg (ref 27.0–33.0)
MCHC: 31.7 g/dL — AB (ref 32.0–36.0)
MCV: 87.7 fL (ref 80.0–100.0)
MONOS PCT: 12 %
MPV: 9.9 fL (ref 7.5–12.5)
Monocytes Absolute: 660 cells/uL (ref 200–950)
Neutro Abs: 3795 cells/uL (ref 1500–7800)
Neutrophils Relative %: 69 %
PLATELETS: 299 10*3/uL (ref 140–400)
RBC: 4.39 MIL/uL (ref 4.20–5.80)
RDW: 14.8 % (ref 11.0–15.0)
WBC: 5.5 10*3/uL (ref 3.8–10.8)

## 2016-09-20 LAB — COMPREHENSIVE METABOLIC PANEL
ALT: 38 U/L (ref 9–46)
AST: 33 U/L (ref 10–35)
Albumin: 3.6 g/dL (ref 3.6–5.1)
Alkaline Phosphatase: 75 U/L (ref 40–115)
BUN: 12 mg/dL (ref 7–25)
CHLORIDE: 103 mmol/L (ref 98–110)
CO2: 27 mmol/L (ref 20–31)
Calcium: 9.1 mg/dL (ref 8.6–10.3)
Creat: 1.3 mg/dL — ABNORMAL HIGH (ref 0.70–1.25)
GLUCOSE: 93 mg/dL (ref 65–99)
POTASSIUM: 3.5 mmol/L (ref 3.5–5.3)
Sodium: 140 mmol/L (ref 135–146)
Total Bilirubin: 0.4 mg/dL (ref 0.2–1.2)
Total Protein: 7 g/dL (ref 6.1–8.1)

## 2016-09-20 MED ORDER — AZITHROMYCIN 250 MG PO TABS: ORAL_TABLET | ORAL | 0 refills | Status: DC

## 2016-09-20 MED ORDER — ZOSTER VACCINE LIVE 19400 UNT/0.65ML ~~LOC~~ SUSR: 0.6500 mL | Freq: Once | SUBCUTANEOUS | 0 refills | Status: AC

## 2016-09-20 NOTE — Patient Instructions (Signed)
Check Blood pressure 3 times weekly over the next few weeks and record and bring to your next visit.

## 2016-09-20 NOTE — Progress Notes (Signed)
Stephen Whitaker, is a 61 y.o. male  [id number]  [id number]  DOB - 09/03/1955  Subjective:  Chief Complaint and HPI: Stephen Whitaker is a 61 y.o. male here today for a follow up visit after being seen in the ED 09/10/2016 for a viral URI.  He was treated with prednisone and tessalon perles.  He has improved but still has a harsh cough and is coughing up brown/green sputum.  He is using his albuterol inhaler 2 times daily.  He is compliant on his BP meds. Denies HA/CP/Vision changes. He has had a cough now for about 3 weeks.  No fever/night sweats.  I noticed his potassium was low the last time it was checked.  He denies palpitations/muscle cramps.    ED/Hospital notes/labs reviewed.     Depression screen [facility] 2/9 09/20/2016 05/17/2016 11/24/2015 07/05/2015 02/24/2015  Decreased Interest 0 0 0 0 0  Down, Depressed, Hopeless 0 0 0 0 0  PHQ - 2 Score 0 0 0 0 0    ROS:   Constitutional:  No f/c, No night sweats, No unexplained weight loss. EENT:  No vision changes, No blurry vision, No hearing changes. No mouth, throat, or ear problems.  Respiratory: + cough, + SOB but improving Cardiac: No CP, no palpitations GI:  No abd pain, No N/V/D. GU: No Urinary s/sx Musculoskeletal: No joint pain Neuro: No headache, no dizziness, no motor weakness.  Skin: No rash Endocrine:  No polydipsia. No polyuria.  Psych: Denies SI/HI  Problem  Bronchitis   Qualifier: Diagnosis of  By: Hassell Done FNP, Tori Milks       ALLERGIES: Allergies  Allergen Reactions  . Ciprofloxacin Rash and Other (See Comments)    Syncope epsiode  . Sulfa Antibiotics Other (See Comments)    Dizziness, syncope episode     PAST MEDICAL HISTORY: Past Medical History:  Diagnosis Date  . Arthritis   . Chronic bronchitis ([place])   . COPD (chronic obstructive pulmonary disease) (HCC)    Chronic Bronchitis  . Dyspnea   . GERD (gastroesophageal reflux disease)   . Gout   . Herpes genitalia   . Hypertension   .  Osteomyelitis ([place]) 05/2016   Osteomyelitis  of right 2nd toe  . Pancreatitis, acute   . Pneumonia 2016    MEDICATIONS AT HOME: Prior to Admission medications   Medication Sig Start Date End Date Taking? Authorizing Provider  albuterol (VENTOLIN HFA) 108 (90 Base) MCG/ACT inhaler INHALE TWO PUFFS BY MOUTH EVERY 4 HOURS AS NEEDED FOR WHEEZING OR SHORTNESS OF BREATH 05/17/16  Yes Tresa Garter, MD  allopurinol (ZYLOPRIM) 300 MG tablet Take 1 tablet (300 mg total) by mouth daily. 08/02/16  Yes Suzan Slick, NP  Bioflavonoid Products (ESTER C PO) Take 1 tablet by mouth daily.   Yes Historical Provider, MD  clobetasol ointment (TEMOVATE) AB-123456789 % Apply 1 application topically 2 (two) times daily. Patient taking differently: Apply 1 application topically 2 (two) times daily as needed (for skin irritation).  11/05/14  Yes Olugbemiga E Doreene Burke, MD  COLCRYS 0.6 MG tablet Take 0.6 mg by mouth 2 (two) times daily as needed (gout flare).  06/04/16  Yes Historical Provider, MD  cyclobenzaprine (FEXMID) 7.5 MG tablet Take 7.5 mg by mouth 4 (four) times daily as needed for muscle spasms.  05/11/16  Yes Historical Provider, MD  fluticasone (FLONASE) 50 MCG/ACT nasal spray USE ONE SPRAY(S) IN EACH NOSTRIL ONCE DAILY 05/17/16  Yes Tresa Garter, MD  Fluticasone-Salmeterol (ADVAIR) 500-50 MCG/DOSE  AEPB Inhale 1 puff into the lungs 2 (two) times daily. 05/17/16  Yes Tresa Garter, MD  hydrochlorothiazide (HYDRODIURIL) 25 MG tablet Take 1 tablet (25 mg total) by mouth daily. 05/17/16  Yes Tresa Garter, MD  ibuprofen (ADVIL,MOTRIN) 800 MG tablet Take 1 tablet (800 mg total) by mouth every 8 (eight) hours as needed for moderate pain. 08/21/16  Yes Suzan Slick, NP  lidocaine (XYLOCAINE) 5 % ointment Apply 1 application topically daily as needed for moderate pain.  05/10/16  Yes Historical Provider, MD  loperamide (IMODIUM A-D) 2 MG tablet Take 2 mg by mouth 4 (four) times daily as needed for diarrhea or  loose stools.   Yes Historical Provider, MD  montelukast (SINGULAIR) 10 MG tablet Take 1 tablet (10 mg total) by mouth at bedtime. 05/17/16  Yes Tresa Garter, MD  Multiple Vitamin (MULTIVITAMIN WITH MINERALS) TABS tablet Take 1 tablet by mouth daily. Patient taking differently: Take 1 tablet by mouth 3 (three) times daily.  11/24/15  Yes Tresa Garter, MD  Neomy-Bacit-Polymyx-Pramoxine (TRIPLE ANTIBIOTIC+PAIN RELIEF EX) Apply 1 application topically daily as needed (apply to affected toe).   Yes Historical Provider, MD  OVER THE COUNTER MEDICATION Take 1 capsule by mouth 3 (three) times daily with meals. Blue/green algae   Yes Historical Provider, MD  Pancrelipase, Lip-Prot-Amyl, (CREON) 24000-76000 units CPEP Take 1 capsule (24,000 Units total) by mouth 3 (three) times daily. 07/27/16  Yes Tresa Garter, MD  pantoprazole (PROTONIX) 40 MG tablet Take 1 tablet (40 mg total) by mouth daily. 05/17/16  Yes Tresa Garter, MD  ipratropium (ATROVENT) 0.06 % nasal spray Place 2 sprays into both nostrils 4 (four) times daily. Patient not taking: Reported on 09/20/2016 09/10/16   Barnet Glasgow, NP  Naphazoline-Pheniramine (EYE ALLERGY RELIEF OP) Place 1 drop into both eyes daily as needed (for allergy eyes).    Historical Provider, MD  predniSONE (STERAPRED UNI-PAK 21 TAB) 10 MG (21) TBPK tablet Take 2 tablets (20 mg total) by mouth 2 (two) times daily. Patient not taking: Reported on 09/20/2016 09/10/16   Barnet Glasgow, NP  terbinafine (LAMISIL AT) 1 % cream Apply 1 application topically 2 (two) times daily. Patient taking differently: Apply 1 application topically 2 (two) times daily as needed (rash).  05/17/16   Tresa Garter, MD  VENTOLIN HFA 108 (90 Base) MCG/ACT inhaler INHALE TWO PUFFS INTO THE LUNGS EVERY 4 HOURS AS NEEDED FOR WHEEZING OR SHORTNESS OF BREATH 08/03/16   Tresa Garter, MD     Objective:  EXAM:   Vitals:   09/20/16 1428  BP: (!) 158/93  Pulse: 86    Resp: 16  Temp: 98.8 F (37.1 C)  TempSrc: Oral  SpO2: 98%  Weight: 202 lb 9.6 oz (91.9 kg)    General appearance : A&OX3. NAD. Non-toxic-appearing HEENT: Atraumatic and Normocephalic.  PERRLA. EOM intact.  TM clear B. Mouth-MMM, post pharynx WNL w/o erythema Neck: supple, no JVD. No cervical lymphadenopathy. No thyromegaly Chest/Lungs:  Breathing-non-labored, Good air entry bilaterally, breath sounds normal without rales, rhonchi, or wheezing  CVS: S1 S2 regular, no murmurs, gallops, rubs  Extremities: Bilateral Lower Ext shows no edema, both legs are warm to touch with = pulse throughout Neurology:  CN II-XII grossly intact, Non focal.   Psych:  TP linear. J/I WNL. Normal speech. Appropriate eye contact and affect.  Skin:  No Rash  Data Review Lab Results  Component Value Date   HGBA1C 5.6 08/10/2014  Assessment & Plan   1. Bronchitis Cover for atypicals due to length of illness Azithromycin as directed  2. Essential hypertension - Comprehensive metabolic panel  3. Hypokalemia Asymptomatic-may need replacement - CBC with Differential/Platelet - Comprehensive metabolic panel  4. Abnormal CBC - CBC with Differential/Platelet  5.  Zostavax Rx given   Patient have been counseled extensively about nutrition and exercise  Return in about 4 weeks (around 10/18/2016) for Dr Gena Fray htn and labs.  The patient was given clear instructions to go to ER or return to medical center if symptoms don't improve, worsen or new problems develop. The patient verbalized understanding. The patient was told to call to get lab results if they haven't heard anything in the next week.     Droll Caldron, PA-C [facility] and [facility] [place], [place]   09/20/2016, 2:52 PMPatient ID: Stephen Whitaker, male   DOB: [birth year], 49 y.o.   MRN: [id number]

## 2016-09-21 ENCOUNTER — Telehealth: Payer: Self-pay

## 2016-09-21 NOTE — Telephone Encounter (Signed)
Contacted pt to go over lab results pt didn't answer lvm asking pt to give me a call at his earliest convenience  

## 2016-09-24 NOTE — Telephone Encounter (Signed)
Pt called office to get lab results, pt aware of lab results.

## 2016-10-03 ENCOUNTER — Encounter (HOSPITAL_COMMUNITY): Payer: Self-pay | Admitting: Emergency Medicine

## 2016-10-03 ENCOUNTER — Ambulatory Visit (HOSPITAL_COMMUNITY)
Admission: EM | Admit: 2016-10-03 | Discharge: 2016-10-03 | Disposition: A | Discharge status: Home / Self Care | Payer: BLUE CROSS/BLUE SHIELD | Attending: Family Medicine | Admitting: Family Medicine

## 2016-10-03 DIAGNOSIS — J0100 Acute maxillary sinusitis, unspecified: Secondary | ICD-10-CM

## 2016-10-03 DIAGNOSIS — J208 Acute bronchitis due to other specified organisms: Secondary | ICD-10-CM | POA: Diagnosis not present

## 2016-10-03 MED ORDER — GUAIFENESIN-CODEINE 100-10 MG/5ML PO SYRP: 10.0000 mL | ORAL_SOLUTION | Freq: Four times a day (QID) | ORAL | 0 refills | Status: DC | PRN

## 2016-10-03 MED ORDER — IPRATROPIUM BROMIDE 0.06 % NA SOLN: 2.0000 | Freq: Four times a day (QID) | NASAL | 2 refills | Status: DC

## 2016-10-03 MED ORDER — DOXYCYCLINE HYCLATE 100 MG PO CAPS: 100.0000 mg | ORAL_CAPSULE | Freq: Two times a day (BID) | ORAL | 0 refills | Status: DC

## 2016-10-03 NOTE — ED Provider Notes (Signed)
[facility]    CSN: [id number] Arrival date & time: 10/03/16  1936     History   Chief Complaint Chief Complaint  Patient presents with  . Cough    HPI Stephen Whitaker is a 61 y.o. male.   HPI  Past Medical History:  Diagnosis Date  . Arthritis   . Chronic bronchitis ([place])   . COPD (chronic obstructive pulmonary disease) (HCC)    Chronic Bronchitis  . Dyspnea   . GERD (gastroesophageal reflux disease)   . Gout   . Herpes genitalia   . Hypertension   . Osteomyelitis ([place]) 05/2016   Osteomyelitis  of right 2nd toe  . Pancreatitis, acute   . Pneumonia 2016    Patient Active Problem List   Diagnosis Date Noted  . Amputated toe, right ([place]) 06/13/2016  . Chronic osteomyelitis of left foot ([place]) 11/24/2015  . Chronic bronchitis ([place]) 11/24/2015  . Osteomyelitis of toe of left foot ([place]) 11/16/2015  . Other pancytopenia ([place]) 01/24/2015  . Chronic pancreatitis ([place]) 01/24/2015  . Osteomyelitis ([place])   . Blood poisoning ([place])   . Neutropenia ([place])   . Iron deficiency anemia due to chronic blood loss   . Leukopenia   . Ulcer of great toe ([place])   . Sepsis ([place]) 01/21/2015  . Leucopenia 01/21/2015  . Anemia 01/21/2015  . Syncope 01/21/2015  . Diarrhea 01/21/2015  . Hypokalemia 01/21/2015  . Loss of consciousness ([place])   . Eczema 10/21/2014  . Essential hypertension 10/21/2014  . Vasovagal syncope 10/21/2014  . Bilateral conjunctivitis 08/10/2014  . Other chronic pancreatitis ([place]) 08/10/2014  . COLD (chronic obstructive lung disease) ([place]) 08/10/2014  . Chronic ethmoidal sinusitis 08/10/2014  . Bronchitis 10/17/2010  . PLANTAR FASCIITIS, LEFT 10/17/2010  . OTHER NEUTROPENIA 04/21/2010  . CALLUS, TOE 03/15/2010  . LEUKOPENIA, MILD 02/20/2010  . LIVER FUNCTION TESTS, ABNORMAL, HX OF 02/16/2010  . DENTAL CARIES 02/15/2010  . ONYCHOMYCOSIS 07/05/2009  . ALLERGIC URTICARIA 07/05/2009  . PAIN IN LIMB 07/05/2009  . ERECTILE DYSFUNCTION  05/25/2008  . INFLUENZA 05/25/2008  . VIRAL INFECTION 10/01/2007  . HSV 05/02/2007  . CONDYLOMA ACUMINATUM 05/02/2007  . HYPERTENSION 05/02/2007  . ALLERGIC RHINITIS 05/02/2007  . ASTHMA 05/02/2007  . GERD 05/02/2007  . PANCREATITIS 05/02/2007    Past Surgical History:  Procedure Laterality Date  . AMPUTATION Left 07/14/2014   Procedure: 2nd Toe Amputation;  Surgeon: [name], MD;  Location: [facility];  Service: Orthopedics;  Laterality: Left;  . AMPUTATION Left 11/16/2015   Procedure: Left Great Toe Amputation at Metatarsophalangeal Joint;  Surgeon: [name], MD;  Location: [facility];  Service: Orthopedics;  Laterality: Left;  . AMPUTATION Right 06/13/2016   Procedure: RIGHT 2nd Toe Amputation;  Surgeon: [name], MD;  Location: [facility];  Service: Orthopedics;  Laterality: Right;  . TOE AMPUTATION Left 11/16/2015   left great toe   . TOE AMPUTATION Right 06/13/2016  . TONSILLECTOMY         Home Medications    Prior to Admission medications   Medication Sig Start Date End Date Taking? Authorizing Provider  albuterol (VENTOLIN HFA) 108 (90 Base) MCG/ACT inhaler INHALE TWO PUFFS BY MOUTH EVERY 4 HOURS AS NEEDED FOR WHEEZING OR SHORTNESS OF BREATH 05/17/16  Yes Tresa Garter, MD  allopurinol (ZYLOPRIM) 300 MG tablet Take 1 tablet (300 mg total) by mouth daily. 08/02/16  Yes Suzan Slick, NP  COLCRYS 0.6 MG tablet Take 0.6 mg by mouth  2 (two) times daily as needed (gout flare).  06/04/16  Yes Historical Provider, MD  fluticasone (FLONASE) 50 MCG/ACT nasal spray USE ONE SPRAY(S) IN EACH NOSTRIL ONCE DAILY 05/17/16  Yes Tresa Garter, MD  Fluticasone-Salmeterol (ADVAIR) 500-50 MCG/DOSE AEPB Inhale 1 puff into the lungs 2 (two) times daily. 05/17/16  Yes Tresa Garter, MD  hydrochlorothiazide (HYDRODIURIL) 25 MG tablet Take 1 tablet (25 mg total) by mouth daily. 05/17/16  Yes Tresa Garter, MD  ibuprofen (ADVIL,MOTRIN) 800 MG tablet Take 1 tablet (800 mg  total) by mouth every 8 (eight) hours as needed for moderate pain. 08/21/16  Yes Suzan Slick, NP  montelukast (SINGULAIR) 10 MG tablet Take 1 tablet (10 mg total) by mouth at bedtime. 05/17/16  Yes Tresa Garter, MD  Multiple Vitamin (MULTIVITAMIN WITH MINERALS) TABS tablet Take 1 tablet by mouth daily. Patient taking differently: Take 1 tablet by mouth 3 (three) times daily.  11/24/15  Yes Tresa Garter, MD  Naphazoline-Pheniramine (EYE ALLERGY RELIEF OP) Place 1 drop into both eyes daily as needed (for allergy eyes).   Yes Historical Provider, MD  Pancrelipase, Lip-Prot-Amyl, (CREON) 24000-76000 units CPEP Take 1 capsule (24,000 Units total) by mouth 3 (three) times daily. 07/27/16  Yes Tresa Garter, MD  pantoprazole (PROTONIX) 40 MG tablet Take 1 tablet (40 mg total) by mouth daily. 05/17/16  Yes Tresa Garter, MD  doxycycline (VIBRAMYCIN) 100 MG capsule Take 1 capsule (100 mg total) by mouth 2 (two) times daily. 10/03/16   Billy Fischer, MD  guaiFENesin-codeine [facility]) 100-10 MG/5ML syrup Take 10 mLs by mouth 4 (four) times daily as needed for cough. 10/03/16   Billy Fischer, MD  ipratropium (ATROVENT) 0.06 % nasal spray Place 2 sprays into the nose 4 (four) times daily. 10/03/16   Billy Fischer, MD    Family History Family History  Problem Relation Age of Onset  . Adopted: Yes    Social History Social History  Substance Use Topics  . Smoking status: Never Smoker  . Smokeless tobacco: Never Used  . Alcohol use No     Allergies   Ciprofloxacin and Sulfa antibiotics   Review of Systems Review of Systems   Physical Exam Triage Vital Signs ED Triage Vitals  Enc Vitals Group     BP 10/03/16 1947 124/70     Pulse Rate 10/03/16 1947 105     Resp 10/03/16 1947 18     Temp 10/03/16 1947 99.1 F (37.3 C)     Temp Source 10/03/16 1947 Oral     SpO2 10/03/16 1947 100 %     Weight --      Height --      Head Circumference --      Peak Flow --       Pain Score 10/03/16 1946 0     Pain Loc --      Pain Edu? --      Excl. in [place]? --    No data found.   Updated Vital Signs BP 124/70 (BP Location: Right Arm)   Pulse 105   Temp 99.1 F (37.3 C) (Oral)   Resp 18   SpO2 100%   Visual Acuity Right Eye Distance:   Left Eye Distance:   Bilateral Distance:    Right Eye Near:   Left Eye Near:    Bilateral Near:     Physical Exam   UC Treatments / Results  Labs (all labs ordered  are listed, but only abnormal results are displayed) Labs Reviewed - No data to display  EKG  EKG Interpretation None       Radiology No results found.  Procedures Procedures (including critical care time)  Medications Ordered in UC Medications - No data to display   Initial Impression / Assessment and Plan / UC Course  I have reviewed the triage vital signs and the nursing notes.  Pertinent labs & imaging results that were available during my care of the patient were reviewed by me and considered in my medical decision making (see chart for details).       Final Clinical Impressions(s) / UC Diagnoses   Final diagnoses:  Acute bronchitis due to other specified organisms  Acute non-recurrent maxillary sinusitis    New Prescriptions Discharge Medication List as of 10/03/2016  8:13 PM    START taking these medications   Details  doxycycline (VIBRAMYCIN) 100 MG capsule Take 1 capsule (100 mg total) by mouth 2 (two) times daily., Starting Wed 10/03/2016, Print    guaiFENesin-codeine (ROBITUSSIN AC) 100-10 MG/5ML syrup Take 10 mLs by mouth 4 (four) times daily as needed for cough., Starting Wed 10/03/2016, Print    ipratropium (ATROVENT) 0.06 % nasal spray Place 2 sprays into the nose 4 (four) times daily., Starting Wed 10/03/2016, Print         Billy Fischer, MD 10/03/16 2021

## 2016-10-03 NOTE — ED Triage Notes (Signed)
The patient presented to the [facility] with a complaint of a cough with general body aches and fatigue x 3 days.

## 2016-10-08 ENCOUNTER — Ambulatory Visit (INDEPENDENT_AMBULATORY_CARE_PROVIDER_SITE_OTHER): Payer: BLUE CROSS/BLUE SHIELD | Admitting: Orthopedic Surgery

## 2016-10-08 ENCOUNTER — Encounter (INDEPENDENT_AMBULATORY_CARE_PROVIDER_SITE_OTHER): Payer: Self-pay | Admitting: Orthopedic Surgery

## 2016-10-08 DIAGNOSIS — L84 Corns and callosities: Secondary | ICD-10-CM | POA: Diagnosis not present

## 2016-10-08 MED ORDER — COLCRYS 0.6 MG PO TABS: 0.6000 mg | ORAL_TABLET | Freq: Two times a day (BID) | ORAL | 3 refills | Status: DC | PRN

## 2016-10-08 MED ORDER — ALLOPURINOL 300 MG PO TABS: 300.0000 mg | ORAL_TABLET | Freq: Every day | ORAL | 2 refills | Status: DC

## 2016-10-08 NOTE — Progress Notes (Signed)
Office Visit Note   Patient: Stephen Whitaker           Date of Birth: 10-21-1955           MRN: [id number] Visit Date: 10/08/2016              Requested by: Tresa Garter, MD [address] [place], [place] [address] PCP: Angelica Chessman, MD   Assessment & Plan: Visit Diagnoses:  1. CALLUS, TOE     Plan: Have refilled his gout medications. He will continue with daily wound and foot checks. Does not need to dressing at this time. Reassurance provided. He'll follow up with [place] in the office in 2 months. Did provide him with an order for custom orthotics to take to [facility] as [facility] was unable to make these for him.  Follow-Up Instructions: Return in about 2 months (around 12/06/2016).   Orders:  No orders of the defined types were placed in this encounter.  Meds ordered this encounter  Medications  . COLCRYS 0.6 MG tablet    Sig: Take 1 tablet (0.6 mg total) by mouth 2 (two) times daily as needed (gout flare).    Dispense:  60 tablet    Refill:  3  . allopurinol (ZYLOPRIM) 300 MG tablet    Sig: Take 1 tablet (300 mg total) by mouth daily.    Dispense:  60 tablet    Refill:  2      Procedures: No procedures performed   Clinical Data: No additional findings.   Subjective: Chief Complaint  Patient presents with  . Left Foot - Follow-up    Patient is a 61 year old gentleman who is following up for left third toe tip discoloration. He states that he has been putting triple antibiotic ointment on his toe and that he is doing better. No open wounds.   He has script for Doxycycline that he was given for something else but that he has not gotten it filled yet.   Requests refills of his allopurinol and colchicine.    Review of Systems  Constitutional: Negative for chills and fever.  Cardiovascular: Negative for leg swelling.  Skin: Negative for wound.     Objective: Vital Signs: There were no vitals taken for this visit.  Physical Exam Physical  Exam  Constitutional: Appears well-developed.  Head: Normocephalic.  Eyes: EOM are normal.  Neck: Normal range of motion.  Cardiovascular: Normal rate.   Pulmonary/Chest: Effort normal.  Neurological: Is alert.  Skin: Skin is warm.  Psychiatric: Has a normal mood and affect. Left foot: The great toe is surgically absent. The third toe has distal callus this was part with a 10 blade knife back to viable tissue. There is no underlying ulceration does have minimal tenderness. There is no sausage digit swelling no erythema no drainage no sign of infection. Foot: The second toe is surgically absent. This is well healed. Ortho Exam  Specialty Comments:  No specialty comments available.  Imaging: No results found.   PMFS History: Patient Active Problem List   Diagnosis Date Noted  . Amputated toe, right ([place]) 06/13/2016  . Chronic osteomyelitis of left foot ([place]) 11/24/2015  . Chronic bronchitis ([place]) 11/24/2015  . Osteomyelitis of toe of left foot ([place]) 11/16/2015  . Other pancytopenia ([place]) 01/24/2015  . Chronic pancreatitis ([place]) 01/24/2015  . Osteomyelitis ([place])   . Blood poisoning ([place])   . Neutropenia ([place])   . Iron deficiency anemia due to chronic blood loss   .  Leukopenia   . Ulcer of great toe ([place])   . Sepsis ([place]) 01/21/2015  . Leucopenia 01/21/2015  . Anemia 01/21/2015  . Syncope 01/21/2015  . Diarrhea 01/21/2015  . Hypokalemia 01/21/2015  . Loss of consciousness ([place])   . Eczema 10/21/2014  . Essential hypertension 10/21/2014  . Vasovagal syncope 10/21/2014  . Bilateral conjunctivitis 08/10/2014  . Other chronic pancreatitis ([place]) 08/10/2014  . COLD (chronic obstructive lung disease) ([place]) 08/10/2014  . Chronic ethmoidal sinusitis 08/10/2014  . Bronchitis 10/17/2010  . PLANTAR FASCIITIS, LEFT 10/17/2010  . OTHER NEUTROPENIA 04/21/2010  . CALLUS, TOE 03/15/2010  . LEUKOPENIA, MILD 02/20/2010  . LIVER FUNCTION TESTS, ABNORMAL, HX OF 02/16/2010  . DENTAL  CARIES 02/15/2010  . ONYCHOMYCOSIS 07/05/2009  . ALLERGIC URTICARIA 07/05/2009  . PAIN IN LIMB 07/05/2009  . ERECTILE DYSFUNCTION 05/25/2008  . INFLUENZA 05/25/2008  . VIRAL INFECTION 10/01/2007  . HSV 05/02/2007  . CONDYLOMA ACUMINATUM 05/02/2007  . HYPERTENSION 05/02/2007  . ALLERGIC RHINITIS 05/02/2007  . ASTHMA 05/02/2007  . GERD 05/02/2007  . PANCREATITIS 05/02/2007   Past Medical History:  Diagnosis Date  . Arthritis   . Chronic bronchitis ([place])   . COPD (chronic obstructive pulmonary disease) (HCC)    Chronic Bronchitis  . Dyspnea   . GERD (gastroesophageal reflux disease)   . Gout   . Herpes genitalia   . Hypertension   . Osteomyelitis ([place]) 05/2016   Osteomyelitis  of right 2nd toe  . Pancreatitis, acute   . Pneumonia 2016    Family History  Problem Relation Age of Onset  . Adopted: Yes    Past Surgical History:  Procedure Laterality Date  . AMPUTATION Left 07/14/2014   Procedure: 2nd Toe Amputation;  Surgeon: [name], MD;  Location: [facility];  Service: Orthopedics;  Laterality: Left;  . AMPUTATION Left 11/16/2015   Procedure: Left Great Toe Amputation at Metatarsophalangeal Joint;  Surgeon: [name], MD;  Location: [facility];  Service: Orthopedics;  Laterality: Left;  . AMPUTATION Right 06/13/2016   Procedure: RIGHT 2nd Toe Amputation;  Surgeon: [name], MD;  Location: [facility];  Service: Orthopedics;  Laterality: Right;  . TOE AMPUTATION Left 11/16/2015   left great toe   . TOE AMPUTATION Right 06/13/2016  . TONSILLECTOMY     Social History   Occupational History  . Not on file.   Social History Main Topics  . Smoking status: Never Smoker  . Smokeless tobacco: Never Used  . Alcohol use No  . Drug use: No  . Sexual activity: Not on file

## 2016-10-14 ENCOUNTER — Encounter (HOSPITAL_COMMUNITY): Payer: Self-pay | Admitting: Emergency Medicine

## 2016-10-14 ENCOUNTER — Ambulatory Visit (HOSPITAL_COMMUNITY)
Admission: EM | Admit: 2016-10-14 | Discharge: 2016-10-14 | Disposition: A | Discharge status: Home / Self Care | Payer: BLUE CROSS/BLUE SHIELD | Attending: Family Medicine | Admitting: Family Medicine

## 2016-10-14 DIAGNOSIS — M10072 Idiopathic gout, left ankle and foot: Secondary | ICD-10-CM | POA: Diagnosis not present

## 2016-10-14 DIAGNOSIS — R197 Diarrhea, unspecified: Secondary | ICD-10-CM | POA: Diagnosis not present

## 2016-10-14 MED ORDER — DOXYCYCLINE HYCLATE 100 MG PO CAPS: 100.0000 mg | ORAL_CAPSULE | Freq: Two times a day (BID) | ORAL | 0 refills | Status: DC

## 2016-10-14 MED ORDER — PREDNISONE 5 MG (48) PO TBPK: ORAL_TABLET | ORAL | 0 refills | Status: DC

## 2016-10-14 NOTE — ED Triage Notes (Signed)
The patient presented to the [facility] with a complaint of diarrhea x 1 day and pain in his right foot that he believed to be a gout flare up.

## 2016-10-14 NOTE — Discharge Instructions (Signed)
I agree with you having a gout flare. Prednisone pack will help resolve this. Continue with Colchicine if you can tolerate at one a day, but this can make diarrhea worsen. Follow up with your PCP for a uric acid check in 2 weeks. They may want to increase Allopurinol for better gout control.

## 2016-10-14 NOTE — ED Provider Notes (Signed)
CSN: [id number]     Arrival date & time 10/14/16  1930 History   First MD Initiated Contact with Patient 10/14/16 1948     Chief Complaint  Patient presents with  . Diarrhea  . Foot Pain   (Consider location/radiation/quality/duration/timing/severity/associated sxs/prior Treatment) 61 yo black male who carries a history of gout presents with right foot pain that he reports "feels like gout". He has started with Colchicine 2 days ago and now has some diarrhea. He denies fever or chills. He admits to missing allopurinol.       Past Medical History:  Diagnosis Date  . Arthritis   . Chronic bronchitis ([facility])   . COPD (chronic obstructive pulmonary disease) (HCC)    Chronic Bronchitis  . Dyspnea   . GERD (gastroesophageal reflux disease)   . Gout   . Herpes genitalia   . Hypertension   . Osteomyelitis ([place]) 05/2016   Osteomyelitis  of right 2nd toe  . Pancreatitis, acute   . Pneumonia 2016   Past Surgical History:  Procedure Laterality Date  . AMPUTATION Left 07/14/2014   Procedure: 2nd Toe Amputation;  Surgeon: [name], MD;  Location: [facility];  Service: Orthopedics;  Laterality: Left;  . AMPUTATION Left 11/16/2015   Procedure: Left Great Toe Amputation at Metatarsophalangeal Joint;  Surgeon: [name], MD;  Location: [facility];  Service: Orthopedics;  Laterality: Left;  . AMPUTATION Right 06/13/2016   Procedure: RIGHT 2nd Toe Amputation;  Surgeon: [name], MD;  Location: [facility];  Service: Orthopedics;  Laterality: Right;  . TOE AMPUTATION Left 11/16/2015   left great toe   . TOE AMPUTATION Right 06/13/2016  . TONSILLECTOMY     Family History  Problem Relation Age of Onset  . Adopted: Yes   Social History  Substance Use Topics  . Smoking status: Never Smoker  . Smokeless tobacco: Never Used  . Alcohol use No    Review of Systems  All other systems reviewed and are negative.   Allergies  Ciprofloxacin and Sulfa antibiotics  Home Medications   Prior to  Admission medications   Medication Sig Start Date End Date Taking? Authorizing Provider  allopurinol (ZYLOPRIM) 300 MG tablet Take 1 tablet (300 mg total) by mouth daily. 10/08/16  Yes Suzan Slick, NP  COLCRYS 0.6 MG tablet Take 1 tablet (0.6 mg total) by mouth 2 (two) times daily as needed (gout flare). 10/08/16  Yes Suzan Slick, NP  hydrochlorothiazide (HYDRODIURIL) 25 MG tablet Take 1 tablet (25 mg total) by mouth daily. 05/17/16  Yes Tresa Garter, MD  ibuprofen (ADVIL,MOTRIN) 800 MG tablet Take 1 tablet (800 mg total) by mouth every 8 (eight) hours as needed for moderate pain. 08/21/16  Yes Suzan Slick, NP  montelukast (SINGULAIR) 10 MG tablet Take 1 tablet (10 mg total) by mouth at bedtime. 05/17/16  Yes Tresa Garter, MD  Multiple Vitamin (MULTIVITAMIN WITH MINERALS) TABS tablet Take 1 tablet by mouth daily. Patient taking differently: Take 1 tablet by mouth 3 (three) times daily.  11/24/15  Yes Tresa Garter, MD  doxycycline (VIBRAMYCIN) 100 MG capsule Take 1 capsule (100 mg total) by mouth 2 (two) times daily. 10/14/16   Bjorn Pippin, PA-C  predniSONE (STERAPRED UNI-PAK 48 TAB) 5 MG (48) TBPK tablet Take as directed 10/14/16   Bjorn Pippin, PA-C   Meds Ordered and Administered this Visit  Medications - No data to display  BP 104/65 (BP Location: Right Arm)  Pulse 107   Temp 99.4 F (37.4 C) (Oral)   Resp 18   SpO2 99%  No data found.   Physical Exam  Constitutional: He appears well-developed and well-nourished.  Musculoskeletal: Normal range of motion. He exhibits edema.  Right foot with erythema, swelling and severe pain to palpation along the forefoot and 2nd toe, no streaking  Skin: Skin is warm and dry. There is erythema.  Nursing note and vitals reviewed.   Urgent Care Course     Procedures (including critical care time)  Labs Review Labs Reviewed - No data to display  Imaging Review No results found.   Visual Acuity Review  Right Eye  Distance:   Left Eye Distance:   Bilateral Distance:    Right Eye Near:   Left Eye Near:    Bilateral Near:         MDM   1. Acute idiopathic gout of left foot   2. Diarrhea, unspecified type    Probable gout flare, though with history of osteomyelitis in the past chose to cover with Doxycycline as well. Treat flare with prednisone pack. Patient is educated that if this is not responsive to treatment and worsens then must go to the ED for further evaluation and treatment. He should f/u with PCP for recheck of uric acid when not having a flare for possible adjustment of his allopurinol dosing.     Bjorn Pippin, PA-C 10/14/16 2002

## 2016-10-15 ENCOUNTER — Ambulatory Visit (HOSPITAL_COMMUNITY)
Admission: EM | Admit: 2016-10-15 | Discharge: 2016-10-15 | Disposition: A | Discharge status: Other Acute Inpatient Hospital | Payer: BLUE CROSS/BLUE SHIELD

## 2016-10-16 ENCOUNTER — Encounter (INDEPENDENT_AMBULATORY_CARE_PROVIDER_SITE_OTHER): Payer: Self-pay | Admitting: Orthopedic Surgery

## 2016-10-16 ENCOUNTER — Ambulatory Visit (INDEPENDENT_AMBULATORY_CARE_PROVIDER_SITE_OTHER): Payer: BLUE CROSS/BLUE SHIELD | Admitting: Orthopedic Surgery

## 2016-10-16 DIAGNOSIS — M86172 Other acute osteomyelitis, left ankle and foot: Secondary | ICD-10-CM | POA: Diagnosis not present

## 2016-10-16 NOTE — Progress Notes (Signed)
Office Visit Note   Patient: Stephen Whitaker           Date of Birth: April 30, 1956           MRN: [id number] Visit Date: 10/16/2016              Requested by: Tresa Garter, MD [address] [place], [place] [address] PCP: Angelica Chessman, MD  Chief Complaint  Patient presents with  . Left Foot - Open Wound    HPI: Patient is a 61 y.o male who presents today for left foot third toe possible infection. There is acute swelling. There is pus drainage medial 3rd toe. He complains of decreased energy the past two days, decreased appetite. He was seen at Urgent Care. He was seen for acute gout complaining of diarrhea due to colchicine. He has reduced colchicine to once daily, she is taking prednisone and doxycycline. There is warmth. Maxcine Ham, RT    Assessment & Plan: Visit Diagnoses:  1. Acute osteomyelitis of toe of left foot (HCC)     Plan: Patient is ostium myelitis ulceration with purulent drainage left foot third toe plan for amputation of the third toe risk and benefits were discussed including need for additional surgery. Patient states he understands wishes to proceed at this time.  Follow-Up Instructions: No Follow-up on file.   Ortho Exam Examination patient is alert oriented no adenopathy well-dressed normal affect normal respiratory effort he does have an antalgic gait. Examination is a palpable pulse he has ulceration cellulitis and drainage from the left foot third toe. Ulcer probes to bone.  Imaging: No results found.  Orders:  No orders of the defined types were placed in this encounter.  No orders of the defined types were placed in this encounter.    Procedures: No procedures performed  Clinical Data: No additional findings.  Subjective: Review of Systems  Objective: Vital Signs: There were no vitals taken for this visit.  Specialty Comments:  No specialty comments available.  PMFS History: Patient Active Problem List   Diagnosis Date Noted  . Acute osteomyelitis of toe of left foot ([place]) 10/19/2016  . Amputated toe, right ([place]) 06/13/2016  . Chronic osteomyelitis of left foot ([place]) 11/24/2015  . Chronic bronchitis ([place]) 11/24/2015  . Osteomyelitis of toe of left foot ([place]) 11/16/2015  . Other pancytopenia ([place]) 01/24/2015  . Chronic pancreatitis ([place]) 01/24/2015  . Osteomyelitis ([place])   . Blood poisoning ([place])   . Neutropenia ([place])   . Iron deficiency anemia due to chronic blood loss   . Leukopenia   . Ulcer of great toe ([place])   . Sepsis ([place]) 01/21/2015  . Leucopenia 01/21/2015  . Anemia 01/21/2015  . Syncope 01/21/2015  . Diarrhea 01/21/2015  . Hypokalemia 01/21/2015  . Loss of consciousness ([place])   . Eczema 10/21/2014  . Essential hypertension 10/21/2014  . Vasovagal syncope 10/21/2014  . Bilateral conjunctivitis 08/10/2014  . Other chronic pancreatitis ([place]) 08/10/2014  . COLD (chronic obstructive lung disease) ([place]) 08/10/2014  . Chronic ethmoidal sinusitis 08/10/2014  . Bronchitis 10/17/2010  . PLANTAR FASCIITIS, LEFT 10/17/2010  . OTHER NEUTROPENIA 04/21/2010  . CALLUS, TOE 03/15/2010  . LEUKOPENIA, MILD 02/20/2010  . LIVER FUNCTION TESTS, ABNORMAL, HX OF 02/16/2010  . DENTAL CARIES 02/15/2010  . ONYCHOMYCOSIS 07/05/2009  . ALLERGIC URTICARIA 07/05/2009  . PAIN IN LIMB 07/05/2009  . ERECTILE DYSFUNCTION 05/25/2008  . INFLUENZA 05/25/2008  . VIRAL INFECTION 10/01/2007  . HSV 05/02/2007  . CONDYLOMA ACUMINATUM  05/02/2007  . HYPERTENSION 05/02/2007  . ALLERGIC RHINITIS 05/02/2007  . ASTHMA 05/02/2007  . GERD 05/02/2007  . PANCREATITIS 05/02/2007   Past Medical History:  Diagnosis Date  . Arthritis   . Chronic bronchitis ([place])   . COPD (chronic obstructive pulmonary disease) (HCC)    Chronic Bronchitis.Advair inhaler   . Dyspnea   . GERD (gastroesophageal reflux disease)    takes Protonix daily  . Gout    takes Allopurinol daily  . Hypertension    takes HCTZ daily  .  Pneumonia 2016    Family History  Problem Relation Age of Onset  . Adopted: Yes    Past Surgical History:  Procedure Laterality Date  . AMPUTATION Left 07/14/2014   Procedure: 2nd Toe Amputation;  Surgeon: [name], MD;  Location: [employer] [facility];  Service: Orthopedics;  Laterality: Left;  . AMPUTATION Left 11/16/2015   Procedure: Left Great Toe Amputation at Metatarsophalangeal Joint;  Surgeon: [name], MD;  Location: [facility];  Service: Orthopedics;  Laterality: Left;  . AMPUTATION Right 06/13/2016   Procedure: RIGHT 2nd Toe Amputation;  Surgeon: [name], MD;  Location: [facility];  Service: Orthopedics;  Laterality: Right;  . TOE AMPUTATION Left 11/16/2015   left great toe   . TOE AMPUTATION Right 06/13/2016  . TONSILLECTOMY     Social History   Occupational History  . Not on file.   Social History Main Topics  . Smoking status: Never Smoker  . Smokeless tobacco: Never Used  . Alcohol use 0.0 oz/week     Comment: rarely  . Drug use: No  . Sexual activity: Not on file

## 2016-10-17 ENCOUNTER — Other Ambulatory Visit (INDEPENDENT_AMBULATORY_CARE_PROVIDER_SITE_OTHER): Payer: Self-pay | Admitting: Family

## 2016-10-17 ENCOUNTER — Ambulatory Visit: Payer: BLUE CROSS/BLUE SHIELD | Admitting: Internal Medicine

## 2016-10-18 ENCOUNTER — Encounter (HOSPITAL_COMMUNITY): Payer: Self-pay | Admitting: *Deleted

## 2016-10-18 ENCOUNTER — Other Ambulatory Visit (INDEPENDENT_AMBULATORY_CARE_PROVIDER_SITE_OTHER): Payer: Self-pay | Admitting: Orthopedic Surgery

## 2016-10-19 ENCOUNTER — Ambulatory Visit (HOSPITAL_COMMUNITY): Payer: BLUE CROSS/BLUE SHIELD | Admitting: Anesthesiology

## 2016-10-19 ENCOUNTER — Encounter (HOSPITAL_COMMUNITY): Payer: Self-pay | Admitting: Surgery

## 2016-10-19 ENCOUNTER — Ambulatory Visit (HOSPITAL_COMMUNITY)
Admission: RE | Admit: 2016-10-19 | Discharge: 2016-10-20 | Disposition: A | Discharge status: Home / Self Care | Payer: BLUE CROSS/BLUE SHIELD | Source: Ambulatory Visit | Attending: Orthopedic Surgery | Admitting: Orthopedic Surgery

## 2016-10-19 ENCOUNTER — Encounter (HOSPITAL_COMMUNITY)
Admission: RE | Disposition: A | Discharge status: Home / Self Care | Payer: Self-pay | Source: Ambulatory Visit | Attending: Orthopedic Surgery

## 2016-10-19 DIAGNOSIS — M1990 Unspecified osteoarthritis, unspecified site: Secondary | ICD-10-CM | POA: Insufficient documentation

## 2016-10-19 DIAGNOSIS — Z882 Allergy status to sulfonamides status: Secondary | ICD-10-CM | POA: Diagnosis not present

## 2016-10-19 DIAGNOSIS — Z89421 Acquired absence of other right toe(s): Secondary | ICD-10-CM | POA: Insufficient documentation

## 2016-10-19 DIAGNOSIS — Z91013 Allergy to seafood: Secondary | ICD-10-CM | POA: Diagnosis not present

## 2016-10-19 DIAGNOSIS — I739 Peripheral vascular disease, unspecified: Secondary | ICD-10-CM | POA: Diagnosis not present

## 2016-10-19 DIAGNOSIS — D649 Anemia, unspecified: Secondary | ICD-10-CM | POA: Insufficient documentation

## 2016-10-19 DIAGNOSIS — M869 Osteomyelitis, unspecified: Secondary | ICD-10-CM | POA: Diagnosis present

## 2016-10-19 DIAGNOSIS — M109 Gout, unspecified: Secondary | ICD-10-CM | POA: Diagnosis not present

## 2016-10-19 DIAGNOSIS — K219 Gastro-esophageal reflux disease without esophagitis: Secondary | ICD-10-CM | POA: Insufficient documentation

## 2016-10-19 DIAGNOSIS — J449 Chronic obstructive pulmonary disease, unspecified: Secondary | ICD-10-CM | POA: Insufficient documentation

## 2016-10-19 DIAGNOSIS — Z89412 Acquired absence of left great toe: Secondary | ICD-10-CM | POA: Diagnosis not present

## 2016-10-19 DIAGNOSIS — S98132A Complete traumatic amputation of one left lesser toe, initial encounter: Secondary | ICD-10-CM

## 2016-10-19 DIAGNOSIS — Z881 Allergy status to other antibiotic agents status: Secondary | ICD-10-CM | POA: Diagnosis not present

## 2016-10-19 DIAGNOSIS — I10 Essential (primary) hypertension: Secondary | ICD-10-CM | POA: Diagnosis not present

## 2016-10-19 DIAGNOSIS — Z89422 Acquired absence of other left toe(s): Secondary | ICD-10-CM | POA: Insufficient documentation

## 2016-10-19 DIAGNOSIS — M86172 Other acute osteomyelitis, left ankle and foot: Secondary | ICD-10-CM | POA: Insufficient documentation

## 2016-10-19 HISTORY — DX: Furuncle of buttock: L02.32

## 2016-10-19 HISTORY — PX: AMPUTATION: SHX166

## 2016-10-19 LAB — CBC
HEMATOCRIT: 35.7 % — AB (ref 39.0–52.0)
HEMOGLOBIN: 11.4 g/dL — AB (ref 13.0–17.0)
MCH: 27.9 pg (ref 26.0–34.0)
MCHC: 31.9 g/dL (ref 30.0–36.0)
MCV: 87.3 fL (ref 78.0–100.0)
Platelets: 380 10*3/uL (ref 150–400)
RBC: 4.09 MIL/uL — ABNORMAL LOW (ref 4.22–5.81)
RDW: 14.3 % (ref 11.5–15.5)
WBC: 6 10*3/uL (ref 4.0–10.5)

## 2016-10-19 LAB — BASIC METABOLIC PANEL
Anion gap: 9 (ref 5–15)
BUN: 15 mg/dL (ref 6–20)
CHLORIDE: 104 mmol/L (ref 101–111)
CO2: 26 mmol/L (ref 22–32)
Calcium: 9.1 mg/dL (ref 8.9–10.3)
Creatinine, Ser: 0.79 mg/dL (ref 0.61–1.24)
GFR calc Af Amer: >60 mL/min (ref >60)
GFR calc non Af Amer: >60 mL/min (ref >60)
GLUCOSE: 80 mg/dL (ref 65–99)
POTASSIUM: 3.2 mmol/L — AB (ref 3.5–5.1)
Sodium: 139 mmol/L (ref 135–145)

## 2016-10-19 SURGERY — AMPUTATION DIGIT
Anesthesia: Monitor Anesthesia Care | Site: Foot | Laterality: Left

## 2016-10-19 MED ORDER — ACETAMINOPHEN 650 MG RE SUPP: 650.0000 mg | Freq: Four times a day (QID) | RECTAL | Status: DC | PRN

## 2016-10-19 MED ORDER — SODIUM CHLORIDE 0.9 % IV SOLN: INTRAVENOUS | Status: DC

## 2016-10-19 MED ORDER — MONTELUKAST SODIUM 10 MG PO TABS
10.0000 mg | ORAL_TABLET | Freq: Every day | ORAL | Status: DC
  Administered 2016-10-19: 10 mg via ORAL
  Filled 2016-10-19: qty 1

## 2016-10-19 MED ORDER — LIDOCAINE 2% (20 MG/ML) 5 ML SYRINGE
INTRAMUSCULAR | Status: AC
  Filled 2016-10-19: qty 5

## 2016-10-19 MED ORDER — OXYCODONE HCL 5 MG PO TABS
ORAL_TABLET | ORAL | Status: AC
  Filled 2016-10-19: qty 1

## 2016-10-19 MED ORDER — ALBUTEROL SULFATE (2.5 MG/3ML) 0.083% IN NEBU: 2.5000 mg | INHALATION_SOLUTION | Freq: Four times a day (QID) | RESPIRATORY_TRACT | Status: DC | PRN

## 2016-10-19 MED ORDER — DOCUSATE SODIUM 100 MG PO CAPS
100.0000 mg | ORAL_CAPSULE | Freq: Two times a day (BID) | ORAL | Status: DC
  Administered 2016-10-19 – 2016-10-20 (×2): 100 mg via ORAL
  Filled 2016-10-19 (×2): qty 1

## 2016-10-19 MED ORDER — POLYETHYLENE GLYCOL 3350 17 G PO PACK: 17.0000 g | PACK | Freq: Every day | ORAL | Status: DC | PRN

## 2016-10-19 MED ORDER — BUPIVACAINE-EPINEPHRINE (PF) 0.5% -1:200000 IJ SOLN
INTRAMUSCULAR | Status: DC | PRN
  Administered 2016-10-19: 30 mL via PERINEURAL

## 2016-10-19 MED ORDER — ONDANSETRON HCL 4 MG/2ML IJ SOLN
INTRAMUSCULAR | Status: AC
  Filled 2016-10-19: qty 2

## 2016-10-19 MED ORDER — FENTANYL CITRATE (PF) 100 MCG/2ML IJ SOLN
INTRAMUSCULAR | Status: AC
  Filled 2016-10-19: qty 2

## 2016-10-19 MED ORDER — LIDOCAINE HCL (CARDIAC) 20 MG/ML IV SOLN
INTRAVENOUS | Status: DC | PRN
  Administered 2016-10-19: 60 mg via INTRATRACHEAL

## 2016-10-19 MED ORDER — ONDANSETRON HCL 4 MG PO TABS: 4.0000 mg | ORAL_TABLET | Freq: Four times a day (QID) | ORAL | Status: DC | PRN

## 2016-10-19 MED ORDER — ONDANSETRON HCL 4 MG/2ML IJ SOLN: 4.0000 mg | Freq: Four times a day (QID) | INTRAMUSCULAR | Status: DC | PRN

## 2016-10-19 MED ORDER — METHOCARBAMOL 1000 MG/10ML IJ SOLN
500.0000 mg | Freq: Four times a day (QID) | INTRAVENOUS | Status: DC | PRN
  Filled 2016-10-19: qty 5

## 2016-10-19 MED ORDER — FENTANYL CITRATE (PF) 100 MCG/2ML IJ SOLN
INTRAMUSCULAR | Status: DC | PRN
  Administered 2016-10-19 (×2): 25 ug via INTRAVENOUS
  Administered 2016-10-19: 50 ug via INTRAVENOUS

## 2016-10-19 MED ORDER — METOCLOPRAMIDE HCL 5 MG/ML IJ SOLN: 5.0000 mg | Freq: Three times a day (TID) | INTRAMUSCULAR | Status: DC | PRN

## 2016-10-19 MED ORDER — LACTATED RINGERS IV SOLN
INTRAVENOUS | Status: DC
  Administered 2016-10-19 (×2): via INTRAVENOUS

## 2016-10-19 MED ORDER — PROPOFOL 10 MG/ML IV BOLUS
INTRAVENOUS | Status: AC
  Filled 2016-10-19: qty 20

## 2016-10-19 MED ORDER — OXYCODONE-ACETAMINOPHEN 10-325 MG PO TABS: 1.0000 | ORAL_TABLET | ORAL | 0 refills | Status: DC | PRN

## 2016-10-19 MED ORDER — FENTANYL CITRATE (PF) 100 MCG/2ML IJ SOLN: 25.0000 ug | INTRAMUSCULAR | Status: DC | PRN

## 2016-10-19 MED ORDER — MIDAZOLAM HCL 2 MG/2ML IJ SOLN
INTRAMUSCULAR | Status: AC
  Filled 2016-10-19: qty 2

## 2016-10-19 MED ORDER — ALLOPURINOL 300 MG PO TABS
300.0000 mg | ORAL_TABLET | Freq: Every day | ORAL | Status: DC
  Administered 2016-10-20: 300 mg via ORAL
  Filled 2016-10-19: qty 1

## 2016-10-19 MED ORDER — HYDROCHLOROTHIAZIDE 25 MG PO TABS
25.0000 mg | ORAL_TABLET | Freq: Every day | ORAL | Status: DC
  Administered 2016-10-19 – 2016-10-20 (×2): 25 mg via ORAL
  Filled 2016-10-19 (×2): qty 1

## 2016-10-19 MED ORDER — PROPOFOL 10 MG/ML IV BOLUS
INTRAVENOUS | Status: DC | PRN
  Administered 2016-10-19 (×2): 50 mg via INTRAVENOUS

## 2016-10-19 MED ORDER — 0.9 % SODIUM CHLORIDE (POUR BTL) OPTIME
TOPICAL | Status: DC | PRN
  Administered 2016-10-19: 1000 mL

## 2016-10-19 MED ORDER — CHLORHEXIDINE GLUCONATE 4 % EX LIQD: 60.0000 mL | Freq: Once | CUTANEOUS | Status: DC

## 2016-10-19 MED ORDER — METHOCARBAMOL 500 MG PO TABS: 500.0000 mg | ORAL_TABLET | Freq: Four times a day (QID) | ORAL | Status: DC | PRN

## 2016-10-19 MED ORDER — OXYCODONE HCL 5 MG PO TABS
5.0000 mg | ORAL_TABLET | ORAL | Status: DC | PRN
  Administered 2016-10-19 (×2): 5 mg via ORAL
  Administered 2016-10-20 (×2): 10 mg via ORAL
  Filled 2016-10-19 (×3): qty 2
  Filled 2016-10-19: qty 1

## 2016-10-19 MED ORDER — METOCLOPRAMIDE HCL 5 MG PO TABS: 5.0000 mg | ORAL_TABLET | Freq: Three times a day (TID) | ORAL | Status: DC | PRN

## 2016-10-19 MED ORDER — CEFAZOLIN SODIUM-DEXTROSE 2-4 GM/100ML-% IV SOLN
2.0000 g | INTRAVENOUS | Status: AC
  Administered 2016-10-19: 2 g via INTRAVENOUS

## 2016-10-19 MED ORDER — CEFAZOLIN IN D5W 1 GM/50ML IV SOLN
1.0000 g | Freq: Four times a day (QID) | INTRAVENOUS | Status: AC
  Administered 2016-10-19 – 2016-10-20 (×3): 1 g via INTRAVENOUS
  Filled 2016-10-19 (×3): qty 50

## 2016-10-19 MED ORDER — ACETAMINOPHEN 325 MG PO TABS: 650.0000 mg | ORAL_TABLET | Freq: Four times a day (QID) | ORAL | Status: DC | PRN

## 2016-10-19 MED ORDER — HYDROMORPHONE HCL 2 MG/ML IJ SOLN: 1.0000 mg | INTRAMUSCULAR | Status: DC | PRN

## 2016-10-19 MED ORDER — BISACODYL 10 MG RE SUPP: 10.0000 mg | Freq: Every day | RECTAL | Status: DC | PRN

## 2016-10-19 MED ORDER — MOMETASONE FURO-FORMOTEROL FUM 200-5 MCG/ACT IN AERO
2.0000 | INHALATION_SPRAY | Freq: Two times a day (BID) | RESPIRATORY_TRACT | Status: DC
  Administered 2016-10-20: 2 via RESPIRATORY_TRACT
  Filled 2016-10-19: qty 8.8

## 2016-10-19 MED ORDER — MAGNESIUM CITRATE PO SOLN: 1.0000 | Freq: Once | ORAL | Status: DC | PRN

## 2016-10-19 MED ORDER — CEFAZOLIN SODIUM 1 G IJ SOLR
INTRAMUSCULAR | Status: AC
  Filled 2016-10-19: qty 20

## 2016-10-19 MED ORDER — GLYCOPYRROLATE 0.2 MG/ML IJ SOLN
INTRAMUSCULAR | Status: DC | PRN
  Administered 2016-10-19: 0.2 mg via INTRAVENOUS

## 2016-10-19 MED ORDER — PANTOPRAZOLE SODIUM 40 MG PO TBEC
40.0000 mg | DELAYED_RELEASE_TABLET | Freq: Every day | ORAL | Status: DC
  Administered 2016-10-19 – 2016-10-20 (×2): 40 mg via ORAL
  Filled 2016-10-19 (×2): qty 1

## 2016-10-19 MED ORDER — COLCHICINE 0.6 MG PO TABS: 0.6000 mg | ORAL_TABLET | Freq: Two times a day (BID) | ORAL | Status: DC | PRN

## 2016-10-19 MED ORDER — OXYCODONE HCL 5 MG PO TABS
5.0000 mg | ORAL_TABLET | ORAL | Status: DC | PRN
  Administered 2016-10-19: 5 mg via ORAL

## 2016-10-19 MED ORDER — ONDANSETRON HCL 4 MG/2ML IJ SOLN
INTRAMUSCULAR | Status: DC | PRN
  Administered 2016-10-19: 4 mg via INTRAVENOUS

## 2016-10-19 MED ORDER — FENTANYL CITRATE (PF) 100 MCG/2ML IJ SOLN
100.0000 ug | Freq: Once | INTRAMUSCULAR | Status: AC
  Administered 2016-10-19: 100 ug via INTRAVENOUS

## 2016-10-19 SURGICAL SUPPLY — 24 items
BLADE SURG 21 STRL SS (BLADE) ×2 IMPLANT
BNDG COHESIVE 4X5 TAN STRL (GAUZE/BANDAGES/DRESSINGS) ×2 IMPLANT
BNDG GAUZE ELAST 4 BULKY (GAUZE/BANDAGES/DRESSINGS) ×2 IMPLANT
COVER SURGICAL LIGHT HANDLE (MISCELLANEOUS) ×2 IMPLANT
DRAPE U-SHAPE 47X51 STRL (DRAPES) ×2 IMPLANT
DRSG ADAPTIC 3X8 NADH LF (GAUZE/BANDAGES/DRESSINGS) ×2 IMPLANT
DRSG PAD ABDOMINAL 8X10 ST (GAUZE/BANDAGES/DRESSINGS) ×2 IMPLANT
ELECT REM PT RETURN 9FT ADLT (ELECTROSURGICAL) ×2
ELECTRODE REM PT RTRN 9FT ADLT (ELECTROSURGICAL) ×1 IMPLANT
GAUZE SPONGE 4X4 12PLY STRL (GAUZE/BANDAGES/DRESSINGS) ×2 IMPLANT
GLOVE BIOGEL PI IND STRL 9 (GLOVE) ×1 IMPLANT
GLOVE BIOGEL PI INDICATOR 9 (GLOVE) ×1
GLOVE SURG ORTHO 9.0 STRL STRW (GLOVE) ×2 IMPLANT
GOWN STRL REUS W/ TWL XL LVL3 (GOWN DISPOSABLE) ×2 IMPLANT
GOWN STRL REUS W/TWL XL LVL3 (GOWN DISPOSABLE) ×2
KIT BASIN OR (CUSTOM PROCEDURE TRAY) ×2 IMPLANT
KIT ROOM TURNOVER OR (KITS) ×2 IMPLANT
NS IRRIG 1000ML POUR BTL (IV SOLUTION) ×2 IMPLANT
PACK ORTHO EXTREMITY (CUSTOM PROCEDURE TRAY) ×2 IMPLANT
PAD ARMBOARD 7.5X6 YLW CONV (MISCELLANEOUS) ×2 IMPLANT
SUT ETHILON 2 0 PSLX (SUTURE) ×2 IMPLANT
TOWEL OR 17X26 10 PK STRL BLUE (TOWEL DISPOSABLE) ×2 IMPLANT
TUBE CONNECTING 12X1/4 (SUCTIONS) ×2 IMPLANT
YANKAUER SUCT BULB TIP NO VENT (SUCTIONS) ×2 IMPLANT

## 2016-10-19 NOTE — Op Note (Signed)
10/19/2016  2:03 PM  PATIENT:  Stephen Whitaker    PRE-OPERATIVE DIAGNOSIS:  Osteomyelitis Left 3rd Toe  POST-OPERATIVE DIAGNOSIS:  Same  PROCEDURE:  Amputation Left 3rd Toe  SURGEON:  [name], MD  PHYSICIAN ASSISTANT:None ANESTHESIA:   General  PREOPERATIVE INDICATIONS:  Stephen Whitaker is a  61 y.o. male with a diagnosis of Osteomyelitis Left 3rd Toe who failed conservative measures and elected for surgical management.    The risks benefits and alternatives were discussed with the patient preoperatively including but not limited to the risks of infection, bleeding, nerve injury, cardiopulmonary complications, the need for revision surgery, among others, and the patient was willing to proceed.  OPERATIVE IMPLANTS: None  OPERATIVE FINDINGS: Minimal petechial bleeding no abscess no purulence  OPERATIVE PROCEDURE: Patient brought the operating room and underwent a regional anesthetic. After adequate levels anesthesia obtained patient's left lower extremity was prepped using DuraPrep draped into a sterile field a timeout was called. A racquet incision was made just distal to the MTP joint of the third toe. The third toe was amputated through the MTP joint. This was irrigated with normal saline. Electrocautery was used for hemostasis. The incision was closed using 2-0 nylon. A dressing was applied patient was taken the PACU in stable condition plan for discharge to home prescription for Percocet 10 mg.

## 2016-10-19 NOTE — Anesthesia Preprocedure Evaluation (Signed)
Anesthesia Evaluation  Patient identified by MRN, date of birth, ID band Patient awake    Reviewed: Allergy & Precautions, NPO status , Patient's Chart, lab work & pertinent test results  History of Anesthesia Complications Negative for: history of anesthetic complications  Airway Mallampati: I  TM Distance: >3 FB Neck ROM: Full    Dental  (+) Teeth Intact,    Pulmonary shortness of breath, COPD,    breath sounds clear to auscultation       Cardiovascular hypertension, Pt. on medications (-) angina(-) Past MI and (-) CHF  Rhythm:Regular     Neuro/Psych negative neurological ROS  negative psych ROS   GI/Hepatic Neg liver ROS, GERD  Medicated and Controlled,  Endo/Other  negative endocrine ROS  Renal/GU negative Renal ROS     Musculoskeletal  (+) Arthritis ,   Abdominal   Peds  Hematology  (+) anemia ,   Anesthesia Other Findings   Reproductive/Obstetrics                             Anesthesia Physical Anesthesia Plan  ASA: II  Anesthesia Plan: MAC and Regional   Post-op Pain Management:    Induction: Intravenous  Airway Management Planned: Natural Airway, Nasal Cannula and Simple Face Mask  Additional Equipment: None  Intra-op Plan:   Post-operative Plan:   Informed Consent: I have reviewed the patients History and Physical, chart, labs and discussed the procedure including the risks, benefits and alternatives for the proposed anesthesia with the patient or authorized representative who has indicated his/her understanding and acceptance.   Dental advisory given  Plan Discussed with: CRNA and Surgeon  Anesthesia Plan Comments:         Anesthesia Quick Evaluation

## 2016-10-19 NOTE — Anesthesia Procedure Notes (Signed)
Anesthesia Regional Block: Popliteal block   Pre-Anesthetic Checklist: ,, timeout performed, Correct Patient, Correct Site, Correct Laterality, Correct Procedure, Correct Position, site marked, Risks and benefits discussed, Surgical consent,  Pre-op evaluation,  At surgeon's request  Laterality: Lower and Left  Prep: chloraprep       Needles:  Injection technique: Single-shot  Needle Type: Echogenic Stimulator Needle          Additional Needles:   Procedures: ultrasound guided,,,,,,,,  Narrative:  Start time: 10/19/2016 12:28 PM End time: 10/19/2016 12:34 PM Injection made incrementally with aspirations every 5 mL.  Performed by: Personally  Anesthesiologist: Hollyanne Schloesser  Additional Notes: H+P and labs reviewed, risks and benefits discussed with patient, procedure tolerated well without complications

## 2016-10-19 NOTE — Transfer of Care (Signed)
Immediate Anesthesia Transfer of Care Note  Patient: Stephen Whitaker  Procedure(s) Performed: Procedure(s): Amputation Left 3rd Toe (Left)  Patient Location: PACU  Anesthesia Type:MAC and Regional  Level of Consciousness: awake, alert  and patient cooperative  Airway & Oxygen Therapy: Patient Spontanous Breathing and Patient connected to nasal cannula oxygen  Post-op Assessment: Report given to RN, Post -op Vital signs reviewed and stable, Patient moving all extremities X 4 and Patient able to stick tongue midline  Post vital signs: Reviewed and stable  Last Vitals:  Vitals:   10/19/16 1131  BP: (!) 154/90  Pulse: 95  Resp: 18  Temp: 36.8 C    Last Pain:  Vitals:   10/19/16 1207  TempSrc:   PainSc: 6       Patients Stated Pain Goal: 6 (99/83/38 2505)  Complications: No apparent anesthesia complications

## 2016-10-19 NOTE — Progress Notes (Signed)
Orthopedic Tech Progress Note Patient Details:  Stephen Whitaker 10-03-1955 [id number]  Ortho Devices Type of Ortho Device: Postop shoe/boot Ortho Device/Splint Interventions: Application   [name] 10/19/2016, 2:47 PM

## 2016-10-19 NOTE — Progress Notes (Signed)
Pt arrived to unit from PACU in stable condition.  Denies any pain at this time.  Orders reviewed.  Will continue to monitor.

## 2016-10-20 DIAGNOSIS — M869 Osteomyelitis, unspecified: Secondary | ICD-10-CM | POA: Diagnosis not present

## 2016-10-20 NOTE — Anesthesia Postprocedure Evaluation (Addendum)
Anesthesia Post Note  Patient: Stephen Whitaker  Procedure(s) Performed: Procedure(s) (LRB): Amputation Left 3rd Toe (Left)  Patient location during evaluation: PACU Anesthesia Type: Regional Level of consciousness: awake and alert Pain management: pain level controlled Vital Signs Assessment: post-procedure vital signs reviewed and stable Respiratory status: spontaneous breathing, nonlabored ventilation, respiratory function stable and patient connected to nasal cannula oxygen Cardiovascular status: stable and blood pressure returned to baseline Anesthetic complications: no       Last Vitals:  Vitals:   10/19/16 2007 10/20/16 0420  BP: 130/74 (!) 146/89  Pulse: 82 80  Resp: 16 16  Temp: 36.8 C 36.7 C    Last Pain:  Vitals:   10/20/16 1020  TempSrc:   PainSc: 7                  Zebulan Hinshaw

## 2016-10-20 NOTE — Progress Notes (Signed)
Physical Therapy Evaluation Patient Details Name: Stephen Whitaker MRN: [id number] DOB: 03/13/1956 Today's Date: 10/20/2016   History of Present Illness  Patient is S/P left 3rd toe amputation on 10/19/2016 PMH: PNA, gout, dyspnes, COPD, GERD, arthritis  Clinical Impression  Patient is likely near his baseline mobility. He has used the crutches in the past for other toe amputations. He is comfortable using the crutches and was able to go 14' today with supervision. He would benefit from further mobility training while in the hospital but will likely not require follow up after.     Follow Up Recommendations No PT follow up    Equipment Recommendations      Recommendations for Other Services       Precautions / Restrictions Precautions Precautions: None Required Braces or Orthoses:  (Surgical shoe ) Restrictions Weight Bearing Restrictions: Yes LLE Weight Bearing: Touchdown weight bearing      Mobility  Bed Mobility Overal bed mobility: Independent             General bed mobility comments: No assistance required to the edge of the bed   Transfers Overall transfer level: Needs assistance Equipment used: Crutches Transfers: Sit to/from [employer] Transfers Sit to Stand: Supervision Stand pivot transfers: Supervision          Ambulation/Gait Ambulation/Gait assistance: Supervision Ambulation Distance (Feet): 40 Feet Assistive device: Rolling walker (2 wheeled) Gait Pattern/deviations: Step-to pattern   Gait velocity interpretation: Below normal speed for age/gender General Gait Details: used curthces. Cuing required for TDWB. Appeared to be putting more weight on the foot.   Stairs            Wheelchair Mobility    Modified Rankin (Stroke Patients Only)       Balance                                             Pertinent Vitals/Pain Pain Assessment: 0-10 Pain Score: 4  Pain Location: left foot     Home Living  Family/patient expects to be discharged to:: Private residence Living Arrangements: Alone Available Help at Discharge: Other (Comment) Type of Home: Apartment Home Access: Stairs to enter Entrance Stairs-Rails: None Entrance Stairs-Number of Steps: 4 Home Layout: One level   Additional Comments: Patient reports he will have people at home to help him/    Prior Function Level of Independence: Independent         Comments: has used crutches in the past for other amputations      Hand Dominance   Dominant Hand: Right    Extremity/Trunk Assessment   Upper Extremity Assessment Upper Extremity Assessment: Overall WFL for tasks assessed    Lower Extremity Assessment Lower Extremity Assessment: LLE deficits/detail LLE: Unable to fully assess due to pain       Communication   Communication: No difficulties  Cognition Arousal/Alertness: Awake/alert Behavior During Therapy: WFL for tasks assessed/performed Overall Cognitive Status: Within Functional Limits for tasks assessed                      General Comments General comments (skin integrity, edema, etc.): Foot is wrapped     Exercises     Assessment/Plan    PT Assessment Patient needs continued PT services  PT Problem List Decreased strength;Decreased activity tolerance;Decreased balance;Decreased mobility;Decreased knowledge of use of DME;Decreased knowledge of precautions;Pain  PT Treatment Interventions Gait training;Stair training;Functional mobility training;Therapeutic activities;Therapeutic exercise;Balance training;Patient/family education    PT Goals (Current goals can be found in the Care Plan section)  Acute Rehab PT Goals Patient Stated Goal: to go home  PT Goal Formulation: With patient Time For Goal Achievement: 11/03/16 Potential to Achieve Goals: Good    Frequency Min 3X/week   Barriers to discharge        Co-evaluation               End of Session Equipment  Utilized During Treatment: Gait belt Activity Tolerance: Patient tolerated treatment well Patient left: in bed;with call bell/phone within reach Nurse Communication: Mobility status PT Visit Diagnosis: Other abnormalities of gait and mobility (R26.89);Pain Pain - Right/Left: Left Pain - part of body: Ankle and joints of foot    Functional Assessment Tool Used: AM-PAC 6 Clicks Basic Mobility;Clinical judgement Functional Limitation: Mobility: Walking and moving around Mobility: Walking and Moving Around Current Status [id number]): At least 20 percent but less than 40 percent impaired, limited or restricted Mobility: Walking and Moving Around Goal Status [phone]): At least 1 percent but less than 20 percent impaired, limited or restricted    Time: 1320-1340 PT Time Calculation (min) (ACUTE ONLY): 20 min   Charges:   PT Evaluation $PT Eval Low Complexity: 1 Procedure     PT G Codes:   PT G-Codes **NOT FOR INPATIENT CLASS** Functional Assessment Tool Used: AM-PAC 6 Clicks Basic Mobility;Clinical judgement Functional Limitation: Mobility: Walking and moving around Mobility: Walking and Moving Around Current Status [id number]): At least 20 percent but less than 40 percent impaired, limited or restricted Mobility: Walking and Moving Around Goal Status [id number]): At least 1 percent but less than 20 percent impaired, limited or restricted     Carney Living PT DPT  10/20/2016, 2:58 PM

## 2016-10-20 NOTE — Discharge Instructions (Signed)
See Dr. Sharol Given in one week.

## 2016-10-21 ENCOUNTER — Encounter (HOSPITAL_COMMUNITY): Payer: Self-pay | Admitting: Orthopedic Surgery

## 2016-10-21 NOTE — H&P (Signed)
Stephen Whitaker is an 61 y.o. male.   Chief Complaint: Ostomy myelitis left foot third toe HPI: Patient is a 61 year old gentleman with peripheral vascular disease status post amputation of the great toe and second toe who presents with ostium myelitis of the third toe and has failed conservative wound care.  Past Medical History:  Diagnosis Date  . Arthritis   . Boil of buttock   . Chronic bronchitis ([place])   . COPD (chronic obstructive pulmonary disease) ([facility])    Chronic Bronchitis.Advair inhaler   . Dyspnea   . GERD (gastroesophageal reflux disease)    takes Protonix daily  . Gout    takes Allopurinol daily  . Hypertension    takes HCTZ daily  . Pneumonia 2016    Past Surgical History:  Procedure Laterality Date  . AMPUTATION Left 07/14/2014   Procedure: 2nd Toe Amputation;  Surgeon: [name], MD;  Location: [place] [facility];  Service: Orthopedics;  Laterality: Left;  . AMPUTATION Left 11/16/2015   Procedure: Left Great Toe Amputation at Metatarsophalangeal Joint;  Surgeon: [name], MD;  Location: [place];  Service: Orthopedics;  Laterality: Left;  . AMPUTATION Right 06/13/2016   Procedure: RIGHT 2nd Toe Amputation;  Surgeon: [name], MD;  Location: [place];  Service: Orthopedics;  Laterality: Right;  . AMPUTATION Left 10/19/2016   Procedure: Amputation Left 3rd Toe;  Surgeon: [name], MD;  Location: [facility];  Service: Orthopedics;  Laterality: Left;  . TOE AMPUTATION Left 11/16/2015   left great toe   . TOE AMPUTATION Right 06/13/2016  . TONSILLECTOMY      Family History  Problem Relation Age of Onset  . Adopted: Yes   Social History:  reports that he has never smoked. He has never used smokeless tobacco. He reports that he drinks alcohol. He reports that he does not use drugs.  Allergies:  Allergies  Allergen Reactions  . Ciprofloxacin Rash and Other (See Comments)    SYNCOPE   . Shellfish Allergy Anaphylaxis  . Sulfa Antibiotics Other (See Comments)   SYNCOPE DIZZINESS     No prescriptions prior to admission.    Results for orders placed or performed during the hospital encounter of 10/19/16 (from the past 48 hour(s))  Basic metabolic panel     Status: Abnormal   Collection Time: 10/19/16 11:51 AM  Result Value Ref Range   Sodium 139 135 - 145 mmol/L   Potassium 3.2 (L) 3.5 - 5.1 mmol/L   Chloride 104 101 - 111 mmol/L   CO2 26 22 - 32 mmol/L   Glucose, Bld 80 65 - 99 mg/dL   BUN 15 6 - 20 mg/dL   Creatinine, Ser 0.79 0.61 - 1.24 mg/dL   Calcium 9.1 8.9 - 10.3 mg/dL   GFR calc non Af Amer >60 >60 mL/min   GFR calc Af Amer >60 >60 mL/min    Comment: (NOTE) The eGFR has been calculated using the CKD EPI equation. This calculation has not been validated in all clinical situations. eGFR's persistently <60 mL/min signify possible Chronic Kidney Disease.    Anion gap 9 5 - 15  CBC     Status: Abnormal   Collection Time: 10/19/16 11:51 AM  Result Value Ref Range   WBC 6.0 4.0 - 10.5 K/uL   RBC 4.09 (L) 4.22 - 5.81 MIL/uL   Hemoglobin 11.4 (L) 13.0 - 17.0 g/dL   HCT 35.7 (L) 39.0 - 52.0 %   MCV 87.3 78.0 - 100.0  fL   MCH 27.9 26.0 - 34.0 pg   MCHC 31.9 30.0 - 36.0 g/dL   RDW 14.3 11.5 - 15.5 %   Platelets 380 150 - 400 K/uL   No results found.  Review of Systems  All other systems reviewed and are negative.   Blood pressure (!) 146/89, pulse 80, temperature 98.1 F (36.7 C), temperature source Oral, resp. rate 16, height 6' 2.5" (1.892 m), weight 195 lb (88.5 kg), SpO2 99 %. Physical Exam on examination patient is alert oriented no adenopathy well-dressed normal affect normal S2 effort. Patient has a faintly palpable dorsalis pedis pulse he has ulceration osteomyelitis left foot third toe status post amputation of the great toe and second toe. Assessment/Plan Assessment: Osteomyelitis left foot third toe.  Plan: We'll plan for amputation left foot third toe. Risk and benefits were discussed including risk of the wound  not healing need for higher level amputation. Patient states she understands wishes to proceed at this time.  [name], MD 10/21/2016, 9:32 AM

## 2016-10-24 ENCOUNTER — Ambulatory Visit: Payer: BLUE CROSS/BLUE SHIELD | Attending: Internal Medicine | Admitting: Internal Medicine

## 2016-10-24 ENCOUNTER — Encounter: Payer: Self-pay | Admitting: Internal Medicine

## 2016-10-24 VITALS — BP 144/77 | HR 96 | Temp 98.7°F | Resp 18 | Ht 74.0 in | Wt 195.8 lb

## 2016-10-24 DIAGNOSIS — R0600 Dyspnea, unspecified: Secondary | ICD-10-CM | POA: Diagnosis not present

## 2016-10-24 DIAGNOSIS — Z89412 Acquired absence of left great toe: Secondary | ICD-10-CM | POA: Insufficient documentation

## 2016-10-24 DIAGNOSIS — J42 Unspecified chronic bronchitis: Secondary | ICD-10-CM | POA: Insufficient documentation

## 2016-10-24 DIAGNOSIS — K861 Other chronic pancreatitis: Secondary | ICD-10-CM | POA: Insufficient documentation

## 2016-10-24 DIAGNOSIS — I10 Essential (primary) hypertension: Secondary | ICD-10-CM | POA: Insufficient documentation

## 2016-10-24 DIAGNOSIS — M109 Gout, unspecified: Secondary | ICD-10-CM | POA: Diagnosis not present

## 2016-10-24 DIAGNOSIS — K219 Gastro-esophageal reflux disease without esophagitis: Secondary | ICD-10-CM | POA: Diagnosis not present

## 2016-10-24 DIAGNOSIS — L0232 Furuncle of buttock: Secondary | ICD-10-CM | POA: Insufficient documentation

## 2016-10-24 DIAGNOSIS — J449 Chronic obstructive pulmonary disease, unspecified: Secondary | ICD-10-CM | POA: Diagnosis not present

## 2016-10-24 MED ORDER — FLUTICASONE PROPIONATE 50 MCG/ACT NA SUSP: 1.0000 | Freq: Every day | NASAL | 3 refills | Status: DC

## 2016-10-24 MED ORDER — PANTOPRAZOLE SODIUM 40 MG PO TBEC: 40.0000 mg | DELAYED_RELEASE_TABLET | Freq: Every day | ORAL | 3 refills | Status: DC

## 2016-10-24 MED ORDER — MUPIROCIN CALCIUM 2 % EX CREA: 1.0000 "application " | TOPICAL_CREAM | Freq: Two times a day (BID) | CUTANEOUS | 0 refills | Status: DC

## 2016-10-24 MED ORDER — FLUTICASONE-SALMETEROL 500-50 MCG/DOSE IN AEPB: 1.0000 | INHALATION_SPRAY | Freq: Two times a day (BID) | RESPIRATORY_TRACT | 3 refills | Status: DC

## 2016-10-24 MED ORDER — HYDROCHLOROTHIAZIDE 25 MG PO TABS: 25.0000 mg | ORAL_TABLET | Freq: Every day | ORAL | 3 refills | Status: DC

## 2016-10-24 MED ORDER — ALBUTEROL SULFATE HFA 108 (90 BASE) MCG/ACT IN AERS: 2.0000 | INHALATION_SPRAY | Freq: Four times a day (QID) | RESPIRATORY_TRACT | 3 refills | Status: DC | PRN

## 2016-10-24 MED ORDER — PANCRELIPASE (LIP-PROT-AMYL) 24000-76000 UNITS PO CPEP: 1.0000 | ORAL_CAPSULE | Freq: Three times a day (TID) | ORAL | 3 refills | Status: DC

## 2016-10-24 MED ORDER — MONTELUKAST SODIUM 10 MG PO TABS: 10.0000 mg | ORAL_TABLET | Freq: Every day | ORAL | 3 refills | Status: DC

## 2016-10-24 NOTE — Progress Notes (Signed)
Patient is here for FU HTN  Patient complains of left foot pain from amputation. Patient complains of a boil being present on his bottom.  Patient has taken medication today. Patient has eaten today.

## 2016-10-24 NOTE — Progress Notes (Signed)
Subjective:  Patient ID: Stephen Whitaker, male    DOB: 1956/03/17  Age: 61 y.o. MRN: [id number]  CC: Hypertension  HPI Stephen Whitaker presents for complaints of a wound/boil on his buttock that presented 3 weeks ago. He says it started as a hard, warm mass. He started warm compresses and the spot opened up in 3 areas which he thought connected. He has used triple antibiotic and covering for treatment. He feels it is improving. He is currently taking doxycycline and has 3 days remaining. He is scheduled to see ortho on Friday and may need a re-check of the wound at that time. He also requests refills on his other medications which he uses more regularly during allergy season.   Past Medical History:  Diagnosis Date  . Arthritis   . Boil of buttock   . Chronic bronchitis ([place])   . COPD (chronic obstructive pulmonary disease) (H[facility])    Chronic Bronchitis.Advair inhaler   . Dyspnea   . GERD (gastroesophageal reflux disease)    takes Protonix daily  . Gout    takes Allopurinol daily  . Hypertension    takes HCTZ daily  . Pneumonia 2016    Past Surgical History:  Procedure Laterality Date  . AMPUTATION Left 07/14/2014   Procedure: 2nd Toe Amputation;  Surgeon: [name], MD;  Location: [facility];  Service: Orthopedics;  Laterality: Left;  . AMPUTATION Left 11/16/2015   Procedure: Left Great Toe Amputation at Metatarsophalangeal Joint;  Surgeon: [name], MD;  Location: [place];  Service: Orthopedics;  Laterality: Left;  . AMPUTATION Right 06/13/2016   Procedure: RIGHT 2nd Toe Amputation;  Surgeon: [name], MD;  Location: [facility];  Service: Orthopedics;  Laterality: Right;  . AMPUTATION Left 10/19/2016   Procedure: Amputation Left 3rd Toe;  Surgeon: [name], MD;  Location: [place];  Service: Orthopedics;  Laterality: Left;  . TOE AMPUTATION Left 11/16/2015   left great toe   . TOE AMPUTATION Right 06/13/2016  . TONSILLECTOMY     Family History  Problem Relation Age  of Onset  . Adopted: Yes   Social History  Substance Use Topics  . Smoking status: Never Smoker  . Smokeless tobacco: Never Used  . Alcohol use 0.0 oz/week     Comment: rarely   ROS Review of Systems  Constitutional: Negative.   HENT: Negative.   Eyes: Negative.   Respiratory: Negative.   Cardiovascular: Negative.   Endocrine: Negative.   Genitourinary: Negative.   Musculoskeletal: Positive for gait problem.  Skin: Positive for wound (right buttock).  Hematological: Negative.   Psychiatric/Behavioral: Negative.    Objective:   Today's Vitals: BP (!) 144/77 (BP Location: Right Arm, Patient Position: Sitting, Cuff Size: Normal)   Pulse 96   Temp 98.7 F (37.1 C) (Oral)   Resp 18   Ht 6\' 2"  (1.88 m)   Wt 195 lb 12.8 oz (88.8 kg)   SpO2 98%   BMI 25.14 kg/m   Physical Exam  Constitutional: He is oriented to person, place, and time. He appears well-developed and well-nourished.  HENT:  Head: Normocephalic.  Eyes: Pupils are equal, round, and reactive to light.  Neck: Normal range of motion.  Cardiovascular: Normal rate, regular rhythm and normal heart sounds.   Abdominal: Soft. Bowel sounds are normal.  Musculoskeletal: Normal range of motion.  Amputation on left foot; dressing and post-op shoe in place.   Neurological: He is alert and oriented to person, place, and  time.  Skin: Skin is warm and dry.     Psychiatric: He has a normal mood and affect. His behavior is normal. Thought content normal.   Assessment & Plan:  Wound on right buttock- we will continue to monitor but this appears to be in the healing stage. I will start him on mupirocin topically and encourage him to dress the wound. If it is not improving he can return or have ortho check it at his appointment on Friday.  S/P Amputation - continue to follow up with Dr. Jess Barters office.   Chronic pancreatitis - stable. We will consider labs at his next visit and refill his creon today.  GERD - stable- he  does not complain of symptoms and feels this is controlled on protonix. We will refill his medications today.  Chronic bronchitis - we will refill his medications and encourage him to use the albuterol only as needed and return to the clinic if he is using more than 2 times per week.   Outpatient Encounter Prescriptions as of 10/24/2016  Medication Sig  . albuterol (PROVENTIL HFA;VENTOLIN HFA) 108 (90 Base) MCG/ACT inhaler Inhale 2 puffs into the lungs every 6 (six) hours as needed for wheezing or shortness of breath.  . allopurinol (ZYLOPRIM) 300 MG tablet Take 1 tablet (300 mg total) by mouth daily.  Marland Kitchen Bioflavonoid Products (ESTER-C) TABS Take 1 tablet by mouth daily.  Marland Kitchen COLCRYS 0.6 MG tablet Take 1 tablet (0.6 mg total) by mouth 2 (two) times daily as needed (gout flare).  Marland Kitchen doxycycline (VIBRAMYCIN) 100 MG capsule Take 1 capsule (100 mg total) by mouth 2 (two) times daily.  . ferrous sulfate 325 (65 FE) MG EC tablet Take 325 mg by mouth daily.  . fluticasone (FLONASE) 50 MCG/ACT nasal spray Place 1 spray into both nostrils daily.  . Fluticasone-Salmeterol (ADVAIR) 500-50 MCG/DOSE AEPB Inhale 1 puff into the lungs 2 (two) times daily.  . hydrochlorothiazide (HYDRODIURIL) 25 MG tablet Take 1 tablet (25 mg total) by mouth daily.  Marland Kitchen ibuprofen (ADVIL,MOTRIN) 800 MG tablet Take 1 tablet (800 mg total) by mouth every 8 (eight) hours as needed for moderate pain.  . montelukast (SINGULAIR) 10 MG tablet Take 1 tablet (10 mg total) by mouth at bedtime.  . Multiple Vitamin (MULTIVITAMIN WITH MINERALS) TABS tablet Take 1 tablet by mouth daily.  Marland Kitchen oxyCODONE-acetaminophen (PERCOCET) 10-325 MG tablet Take 1 tablet by mouth every 4 (four) hours as needed for pain.  . Pancrelipase, Lip-Prot-Amyl, (CREON) 24000-76000 units CPEP Take 1 capsule (24,000 Units total) by mouth 3 (three) times daily with meals.  . pantoprazole (PROTONIX) 40 MG tablet Take 1 tablet (40 mg total) by mouth daily.  . predniSONE (STERAPRED  UNI-PAK 48 TAB) 5 MG (48) TBPK tablet Take as directed  . SPIRULINA PO Take 1 capsule by mouth 3 (three) times daily.  . [DISCONTINUED] albuterol (PROVENTIL HFA;VENTOLIN HFA) 108 (90 Base) MCG/ACT inhaler Inhale 2 puffs into the lungs every 6 (six) hours as needed for wheezing or shortness of breath.  . [DISCONTINUED] fluticasone (FLONASE) 50 MCG/ACT nasal spray Place 1 spray into both nostrils daily.  . [DISCONTINUED] Fluticasone-Salmeterol (ADVAIR) 500-50 MCG/DOSE AEPB Inhale 1 puff into the lungs 2 (two) times daily.  . [DISCONTINUED] hydrochlorothiazide (HYDRODIURIL) 25 MG tablet Take 1 tablet (25 mg total) by mouth daily.  . [DISCONTINUED] montelukast (SINGULAIR) 10 MG tablet Take 1 tablet (10 mg total) by mouth at bedtime.  . [DISCONTINUED] Pancrelipase, Lip-Prot-Amyl, (CREON) 24000-76000 units CPEP Take 1 capsule by  mouth 3 (three) times daily with meals.  . [DISCONTINUED] pantoprazole (PROTONIX) 40 MG tablet Take 40 mg by mouth daily.  . [DISCONTINUED] Multiple Vitamin (MULTIVITAMIN WITH MINERALS) TABS tablet Take 1 tablet by mouth 3 (three) times daily.   No facility-administered encounter medications on file as of 10/24/2016.     Follow-up: Return in about 3 months (around 01/24/2017) for Follow up HTN, Routine Follow Up.    Beckey Rutter, AGNP Student  Evaluation and management procedures were performed by me with DNP Student in attendance, note written by DNP student under my supervision and collaboration. I have reviewed the note and I agree with the management and plan.   Angelica Chessman, MD, [place], [place], [place], [place] and [place], [place]   10/30/2016, 7:42 PM

## 2016-10-24 NOTE — Patient Instructions (Signed)
DASH Eating Plan DASH stands for "Dietary Approaches to Stop Hypertension." The DASH eating plan is a healthy eating plan that has been shown to reduce high blood pressure (hypertension). It may also reduce your risk for type 2 diabetes, heart disease, and stroke. The DASH eating plan may also help with weight loss. What are tips for following this plan? General guidelines   Avoid eating more than 2,300 mg (milligrams) of salt (sodium) a day. If you have hypertension, you may need to reduce your sodium intake to 1,500 mg a day.  Limit alcohol intake to no more than 1 drink a day for nonpregnant women and 2 drinks a day for men. One drink equals 12 oz of beer, 5 oz of wine, or 1 oz of hard liquor.  Work with your health care provider to maintain a healthy body weight or to lose weight. Ask what an ideal weight is for you.  Get at least 30 minutes of exercise that causes your heart to beat faster (aerobic exercise) most days of the week. Activities may include walking, swimming, or biking.  Work with your health care provider or diet and nutrition specialist (dietitian) to adjust your eating plan to your individual calorie needs. Reading food labels   Check food labels for the amount of sodium per serving. Choose foods with less than 5 percent of the Daily Value of sodium. Generally, foods with less than 300 mg of sodium per serving fit into this eating plan.  To find whole grains, look for the word "whole" as the first word in the ingredient list. Shopping   Buy products labeled as "low-sodium" or "no salt added."  Buy fresh foods. Avoid canned foods and premade or frozen meals. Cooking   Avoid adding salt when cooking. Use salt-free seasonings or herbs instead of table salt or sea salt. Check with your health care provider or pharmacist before using salt substitutes.  Do not fry foods. Cook foods using healthy methods such as baking, boiling, grilling, and broiling instead.  Cook with  heart-healthy oils, such as olive, canola, soybean, or sunflower oil. Meal planning    Eat a balanced diet that includes:  5 or more servings of fruits and vegetables each day. At each meal, try to fill half of your plate with fruits and vegetables.  Up to 6-8 servings of whole grains each day.  Less than 6 oz of lean meat, poultry, or fish each day. A 3-oz serving of meat is about the same size as a deck of cards. One egg equals 1 oz.  2 servings of low-fat dairy each day.  A serving of nuts, seeds, or beans 5 times each week.  Heart-healthy fats. Healthy fats called Omega-3 fatty acids are found in foods such as flaxseeds and coldwater fish, like sardines, salmon, and mackerel.  Limit how much you eat of the following:  Canned or prepackaged foods.  Food that is high in trans fat, such as fried foods.  Food that is high in saturated fat, such as fatty meat.  Sweets, desserts, sugary drinks, and other foods with added sugar.  Full-fat dairy products.  Do not salt foods before eating.  Try to eat at least 2 vegetarian meals each week.  Eat more home-cooked food and less restaurant, buffet, and fast food.  When eating at a restaurant, ask that your food be prepared with less salt or no salt, if possible. What foods are recommended? The items listed may not be a complete list. Talk   with your dietitian about what dietary choices are best for you. Grains  Whole-grain or whole-wheat bread. Whole-grain or whole-wheat pasta. Brown rice. Stephen Whitaker. Bulgur. Whole-grain and low-sodium cereals. Pita bread. Low-fat, low-sodium crackers. Whole-wheat flour tortillas. Vegetables  Fresh or frozen vegetables (raw, steamed, roasted, or grilled). Low-sodium or reduced-sodium tomato and vegetable juice. Low-sodium or reduced-sodium tomato sauce and tomato paste. Low-sodium or reduced-sodium canned vegetables. Fruits  All fresh, dried, or frozen fruit. Canned fruit in natural juice  (without added sugar). Meat and other protein foods  Skinless chicken or [place]. Ground chicken or [place]. Pork with fat trimmed off. Fish and seafood. Egg whites. Dried beans, peas, or lentils. Unsalted nuts, nut butters, and seeds. Unsalted canned beans. Lean cuts of beef with fat trimmed off. Low-sodium, lean deli meat. Dairy  Low-fat (1%) or fat-free (skim) milk. Fat-free, low-fat, or reduced-fat cheeses. Nonfat, low-sodium ricotta or cottage cheese. Low-fat or nonfat yogurt. Low-fat, low-sodium cheese. Fats and oils  Soft margarine without trans fats. Vegetable oil. Low-fat, reduced-fat, or light mayonnaise and salad dressings (reduced-sodium). Canola, safflower, olive, soybean, and sunflower oils. Avocado. Seasoning and other foods  Herbs. Spices. Seasoning mixes without salt. Unsalted popcorn and pretzels. Fat-free sweets. What foods are not recommended? The items listed may not be a complete list. Talk with your dietitian about what dietary choices are best for you. Grains  Baked goods made with fat, such as croissants, muffins, or some breads. Dry pasta or rice meal packs. Vegetables  Creamed or fried vegetables. Vegetables in a cheese sauce. Regular canned vegetables (not low-sodium or reduced-sodium). Regular canned tomato sauce and paste (not low-sodium or reduced-sodium). Regular tomato and vegetable juice (not low-sodium or reduced-sodium). Stephen Whitaker. Olives. Fruits  Canned fruit in a light or heavy syrup. Fried fruit. Fruit in cream or butter sauce. Meat and other protein foods  Fatty cuts of meat. Ribs. Fried meat. Stephen Whitaker. Sausage. Bologna and other processed lunch meats. Salami. Fatback. Hotdogs. Bratwurst. Salted nuts and seeds. Canned beans with added salt. Canned or smoked fish. Whole eggs or egg yolks. Chicken or [place] with skin. Dairy  Whole or 2% milk, cream, and half-and-half. Whole or full-fat cream cheese. Whole-fat or sweetened yogurt. Full-fat cheese. Nondairy creamers.  Whipped toppings. Processed cheese and cheese spreads. Fats and oils  Butter. Stick margarine. Lard. Shortening. Ghee. Bacon fat. Tropical oils, such as coconut, palm kernel, or palm oil. Seasoning and other foods  Salted popcorn and pretzels. Onion salt, garlic salt, seasoned salt, table salt, and sea salt. Worcestershire sauce. Tartar sauce. Barbecue sauce. Teriyaki sauce. Soy sauce, including reduced-sodium. Steak sauce. Canned and packaged gravies. Fish sauce. Oyster sauce. Cocktail sauce. Horseradish that you find on the shelf. Ketchup. Mustard. Meat flavorings and tenderizers. Bouillon cubes. Hot sauce and Tabasco sauce. Premade or packaged marinades. Premade or packaged taco seasonings. Relishes. Regular salad dressings. Where to find more information:  National Heart, Lung, and [place]: https://wilson-eaton.com/  American Heart Association: www.heart.org Summary  The DASH eating plan is a healthy eating plan that has been shown to reduce high blood pressure (hypertension). It may also reduce your risk for type 2 diabetes, heart disease, and stroke.  With the DASH eating plan, you should limit salt (sodium) intake to 2,300 mg a day. If you have hypertension, you may need to reduce your sodium intake to 1,500 mg a day.  When on the DASH eating plan, aim to eat more fresh fruits and vegetables, whole grains, lean proteins, low-fat dairy, and heart-healthy fats.  Work  with your health care provider or diet and nutrition specialist (dietitian) to adjust your eating plan to your individual calorie needs. This information is not intended to replace advice given to you by your health care provider. Make sure you discuss any questions you have with your health care provider. Document Released: 07/26/2011 Document Revised: 07/30/2016 Document Reviewed: 07/30/2016 Elsevier Interactive Patient Education  2017 Elsevier Inc. Hypertension Hypertension is another name for high blood pressure. High  blood pressure forces your heart to work harder to pump blood. This can cause problems over time. There are two numbers in a blood pressure reading. There is a top number (systolic) over a bottom number (diastolic). It is best to have a blood pressure below 120/80. Healthy choices can help lower your blood pressure. You may need medicine to help lower your blood pressure if:  Your blood pressure cannot be lowered with healthy choices.  Your blood pressure is higher than 130/80. Follow these instructions at home: Eating and drinking   If directed, follow the DASH eating plan. This diet includes:  Filling half of your plate at each meal with fruits and vegetables.  Filling one quarter of your plate at each meal with whole grains. Whole grains include whole wheat pasta, brown rice, and whole grain bread.  Eating or drinking low-fat dairy products, such as skim milk or low-fat yogurt.  Filling one quarter of your plate at each meal with low-fat (lean) proteins. Low-fat proteins include fish, skinless chicken, eggs, beans, and tofu.  Avoiding fatty meat, cured and processed meat, or chicken with skin.  Avoiding premade or processed food.  Eat less than 1,500 mg of salt (sodium) a day.  Limit alcohol use to no more than 1 drink a day for nonpregnant women and 2 drinks a day for men. One drink equals 12 oz of beer, 5 oz of wine, or 1 oz of hard liquor. Lifestyle   Work with your doctor to stay at a healthy weight or to lose weight. Ask your doctor what the best weight is for you.  Get at least 30 minutes of exercise that causes your heart to beat faster (aerobic exercise) most days of the week. This may include walking, swimming, or biking.  Get at least 30 minutes of exercise that strengthens your muscles (resistance exercise) at least 3 days a week. This may include lifting weights or pilates.  Do not use any products that contain nicotine or tobacco. This includes cigarettes and  e-cigarettes. If you need help quitting, ask your doctor.  Check your blood pressure at home as told by your doctor.  Keep all follow-up visits as told by your doctor. This is important. Medicines   Take over-the-counter and prescription medicines only as told by your doctor. Follow directions carefully.  Do not skip doses of blood pressure medicine. The medicine does not work as well if you skip doses. Skipping doses also puts you at risk for problems.  Ask your doctor about side effects or reactions to medicines that you should watch for. Contact a doctor if:  You think you are having a reaction to the medicine you are taking.  You have headaches that keep coming back (recurring).  You feel dizzy.  You have swelling in your ankles.  You have trouble with your vision. Get help right away if:  You get a very bad headache.  You start to feel confused.  You feel weak or numb.  You feel faint.  You get very bad pain  in your:  Chest.  Belly (abdomen).  You throw up (vomit) more than once.  You have trouble breathing. Summary  Hypertension is another name for high blood pressure.  Making healthy choices can help lower blood pressure. If your blood pressure cannot be controlled with healthy choices, you may need to take medicine. This information is not intended to replace advice given to you by your health care provider. Make sure you discuss any questions you have with your health care provider. Document Released: 01/23/2008 Document Revised: 07/04/2016 Document Reviewed: 07/04/2016 Elsevier Interactive Patient Education  2017 [employer].

## 2016-10-25 ENCOUNTER — Other Ambulatory Visit: Payer: Self-pay | Admitting: Pharmacist

## 2016-10-25 MED ORDER — MUPIROCIN 2 % EX OINT: 1.0000 "application " | TOPICAL_OINTMENT | Freq: Two times a day (BID) | CUTANEOUS | 0 refills | Status: DC

## 2016-10-26 ENCOUNTER — Encounter (INDEPENDENT_AMBULATORY_CARE_PROVIDER_SITE_OTHER): Payer: Self-pay | Admitting: Orthopedic Surgery

## 2016-10-26 ENCOUNTER — Ambulatory Visit (INDEPENDENT_AMBULATORY_CARE_PROVIDER_SITE_OTHER): Payer: BLUE CROSS/BLUE SHIELD | Admitting: Orthopedic Surgery

## 2016-10-26 DIAGNOSIS — Z89422 Acquired absence of other left toe(s): Secondary | ICD-10-CM

## 2016-10-26 DIAGNOSIS — S98132A Complete traumatic amputation of one left lesser toe, initial encounter: Secondary | ICD-10-CM

## 2016-10-26 MED ORDER — OXYCODONE-ACETAMINOPHEN 10-325 MG PO TABS: 1.0000 | ORAL_TABLET | Freq: Four times a day (QID) | ORAL | 0 refills | Status: DC | PRN

## 2016-10-26 NOTE — Progress Notes (Signed)
Office Visit Note   Patient: Stephen Whitaker           Date of Birth: [birth year]           MRN: [id number] Visit Date: 10/26/2016              Requested by: Tresa Garter, MD [address] [place], [place] [address] PCP: Angelica Chessman, MD   Assessment & Plan: Visit Diagnoses:  1. Amputated toe of left foot ([place])     Plan: Follow up in 2 weeks for suture removal.  Follow-Up Instructions: Return in about 2 weeks (around 11/09/2016).   Orders:  No orders of the defined types were placed in this encounter.  Meds ordered this encounter  Medications  . oxyCODONE-acetaminophen (PERCOCET) 10-325 MG tablet    Sig: Take 1 tablet by mouth every 6 (six) hours as needed for pain.    Dispense:  30 tablet    Refill:  0      Procedures: No procedures performed   Clinical Data: No additional findings.   Subjective: Chief Complaint  Patient presents with  . Left 3rd Toe - Routine Post Op    Patient presents for follow up status post left third toe amputation on 10/19/2016. He states that he is doing pretty well. He ambulates with one crutch and in a post op shoe. The dressing is removed and sutures are intact. Incision looks good. He continues to have pain and needs a refill on his Oxycodone 10/325.     Review of Systems  Constitutional: Negative for chills and fever.     Objective: Vital Signs: There were no vitals taken for this visit.  Physical Exam Incision is clean, dry and intact. No erythema. No sign of infection.  Ortho Exam  Specialty Comments:  No specialty comments available.  Imaging: No results found.   PMFS History: Patient Active Problem List   Diagnosis Date Noted  . Boil of buttock 10/24/2016  . Acute osteomyelitis of toe of left foot ([place]) 10/19/2016  . Amputated toe of left foot ([place]) 10/19/2016  . Amputated toe, right ([place]) 06/13/2016  . Chronic osteomyelitis of left foot ([place]) 11/24/2015  . Chronic bronchitis ([place])  11/24/2015  . Other pancytopenia ([place]) 01/24/2015  . Chronic pancreatitis ([place]) 01/24/2015  . Blood poisoning ([place])   . Neutropenia ([place])   . Iron deficiency anemia due to chronic blood loss   . Leukopenia   . Sepsis ([place]) 01/21/2015  . Leucopenia 01/21/2015  . Anemia 01/21/2015  . Syncope 01/21/2015  . Diarrhea 01/21/2015  . Hypokalemia 01/21/2015  . Loss of consciousness ([place])   . Eczema 10/21/2014  . Essential hypertension 10/21/2014  . Vasovagal syncope 10/21/2014  . Bilateral conjunctivitis 08/10/2014  . Other chronic pancreatitis ([place]) 08/10/2014  . COLD (chronic obstructive lung disease) ([place]) 08/10/2014  . Chronic ethmoidal sinusitis 08/10/2014  . Bronchitis 10/17/2010  . PLANTAR FASCIITIS, LEFT 10/17/2010  . OTHER NEUTROPENIA 04/21/2010  . CALLUS, TOE 03/15/2010  . LEUKOPENIA, MILD 02/20/2010  . LIVER FUNCTION TESTS, ABNORMAL, HX OF 02/16/2010  . DENTAL CARIES 02/15/2010  . ONYCHOMYCOSIS 07/05/2009  . ALLERGIC URTICARIA 07/05/2009  . PAIN IN LIMB 07/05/2009  . ERECTILE DYSFUNCTION 05/25/2008  . INFLUENZA 05/25/2008  . VIRAL INFECTION 10/01/2007  . HSV 05/02/2007  . CONDYLOMA ACUMINATUM 05/02/2007  . HYPERTENSION 05/02/2007  . ALLERGIC RHINITIS 05/02/2007  . ASTHMA 05/02/2007  . Gastroesophageal reflux disease without esophagitis 05/02/2007  . PANCREATITIS 05/02/2007   Past Medical History:  Diagnosis Date  . Arthritis   . Boil of buttock   . Chronic bronchitis ([place])   . COPD (chronic obstructive pulmonary disease) ([facility])    Chronic Bronchitis.Advair inhaler   . Dyspnea   . GERD (gastroesophageal reflux disease)    takes Protonix daily  . Gout    takes Allopurinol daily  . Hypertension    takes HCTZ daily  . Pneumonia 2016    Family History  Problem Relation Age of Onset  . Adopted: Yes    Past Surgical History:  Procedure Laterality Date  . AMPUTATION Left 07/14/2014   Procedure: 2nd Toe Amputation;  Surgeon: [name], MD;  Location: [facility];  Service: Orthopedics;  Laterality: Left;  . AMPUTATION Left 11/16/2015   Procedure: Left Great Toe Amputation at Metatarsophalangeal Joint;  Surgeon: [name], MD;  Location: [facility];  Service: Orthopedics;  Laterality: Left;  . AMPUTATION Right 06/13/2016   Procedure: RIGHT 2nd Toe Amputation;  Surgeon: [name], MD;  Location: [facility];  Service: Orthopedics;  Laterality: Right;  . AMPUTATION Left 10/19/2016   Procedure: Amputation Left 3rd Toe;  Surgeon: [name], MD;  Location: [facility];  Service: Orthopedics;  Laterality: Left;  . TOE AMPUTATION Left 11/16/2015   left great toe   . TOE AMPUTATION Right 06/13/2016  . TONSILLECTOMY     Social History   Occupational History  . Not on file.   Social History Main Topics  . Smoking status: Never Smoker  . Smokeless tobacco: Never Used  . Alcohol use 0.0 oz/week     Comment: rarely  . Drug use: No  . Sexual activity: Not on file

## 2016-11-02 ENCOUNTER — Telehealth: Payer: Self-pay | Admitting: Internal Medicine

## 2016-11-02 NOTE — Telephone Encounter (Signed)
Pt calling in regards to previous blood tests that have been performed.  Requesting to speak to nurse when available

## 2016-11-05 NOTE — Telephone Encounter (Signed)
Patients last visit with Thereasa Solo was 09/20/16. Patient received results via telephone from RN Carilyn Goodpasture on 09/24/16. No other results to share at this time. Medical Assistant left message on patient's home and cell voicemail. Voicemail states to give a call back to [place] with [facility] at [phone].

## 2016-11-08 ENCOUNTER — Ambulatory Visit (INDEPENDENT_AMBULATORY_CARE_PROVIDER_SITE_OTHER): Payer: BLUE CROSS/BLUE SHIELD | Admitting: Orthopedic Surgery

## 2016-11-08 ENCOUNTER — Encounter (INDEPENDENT_AMBULATORY_CARE_PROVIDER_SITE_OTHER): Payer: Self-pay | Admitting: Orthopedic Surgery

## 2016-11-08 VITALS — Ht 74.0 in | Wt 195.0 lb

## 2016-11-08 DIAGNOSIS — Z89422 Acquired absence of other left toe(s): Secondary | ICD-10-CM

## 2016-11-08 DIAGNOSIS — S98132A Complete traumatic amputation of one left lesser toe, initial encounter: Secondary | ICD-10-CM

## 2016-11-08 NOTE — Progress Notes (Signed)
Office Visit Note   Patient: Stephen Whitaker           Date of Birth: [birth year]           MRN: [id number] Visit Date: 11/08/2016              Requested by: Tresa Garter, MD [address] [place], [place] [address] PCP: Angelica Chessman, MD  Chief Complaint  Patient presents with  . Left Foot - Routine Post Op    10/19/16 left foot 3rd toe amputation    HPI: The patient is a 61 year old gentleman who presents today in follow-up for amputation third toe left foot. This is well-healed. Sutures were harvested today. Patient also was given his disability and work. Work today.  Assessment & Plan: Visit Diagnoses:  1. Amputated toe of left foot ([place])     Plan: Sutures harvested. Continue with dry dressings following daily wound cleansing. May advance straight with shoe wear. anticipate a return to work 11/26/2016.  Follow-Up Instructions: Return in about 4 weeks (around 12/06/2016).   Ortho Exam  Patient is alert, oriented, no adenopathy, well-dressed, normal affect, normal respiratory effort. Left foot: incision is well healed. No open areas. No drainage, erythema or odor.   Imaging: No results found.  Labs: Lab Results  Component Value Date   HGBA1C 5.6 08/10/2014   ESRSEDRATE 27 (H) 07/05/2015   ESRSEDRATE 80 (H) 01/21/2015   CRP 1.8 (H) 07/05/2015   LABURIC 6.0 08/10/2014   REPTSTATUS 01/22/2015 FINAL 01/21/2015   CULT NO GROWTH Performed at [facility]  01/21/2015    Orders:  No orders of the defined types were placed in this encounter.  No orders of the defined types were placed in this encounter.    Procedures: No procedures performed  Clinical Data: No additional findings.  ROS: Review of Systems  Constitutional: Negative for chills and fever.  Cardiovascular: Negative for leg swelling.  Skin: Negative for color change and wound.    Objective: Vital Signs: Ht 6\' 2"  (1.88 m)   Wt 195 lb (88.5 kg)   BMI 25.04 kg/m    Specialty Comments:  No specialty comments available.  PMFS History: Patient Active Problem List   Diagnosis Date Noted  . Boil of buttock 10/24/2016  . Acute osteomyelitis of toe of left foot ([place]) 10/19/2016  . Amputated toe of left foot ([place]) 10/19/2016  . Amputated toe, right ([place]) 06/13/2016  . Chronic bronchitis ([place]) 11/24/2015  . Other pancytopenia ([place]) 01/24/2015  . Chronic pancreatitis ([place]) 01/24/2015  . Blood poisoning ([place])   . Neutropenia ([place])   . Iron deficiency anemia due to chronic blood loss   . Leukopenia   . Sepsis ([place]) 01/21/2015  . Leucopenia 01/21/2015  . Anemia 01/21/2015  . Syncope 01/21/2015  . Diarrhea 01/21/2015  . Hypokalemia 01/21/2015  . Loss of consciousness ([place])   . Eczema 10/21/2014  . Essential hypertension 10/21/2014  . Vasovagal syncope 10/21/2014  . Bilateral conjunctivitis 08/10/2014  . Other chronic pancreatitis ([place]) 08/10/2014  . COLD (chronic obstructive lung disease) ([place]) 08/10/2014  . Chronic ethmoidal sinusitis 08/10/2014  . Bronchitis 10/17/2010  . PLANTAR FASCIITIS, LEFT 10/17/2010  . OTHER NEUTROPENIA 04/21/2010  . CALLUS, TOE 03/15/2010  . LEUKOPENIA, MILD 02/20/2010  . LIVER FUNCTION TESTS, ABNORMAL, HX OF 02/16/2010  . DENTAL CARIES 02/15/2010  . ONYCHOMYCOSIS 07/05/2009  . ALLERGIC URTICARIA 07/05/2009  . PAIN IN LIMB 07/05/2009  . ERECTILE DYSFUNCTION 05/25/2008  . INFLUENZA 05/25/2008  .  VIRAL INFECTION 10/01/2007  . HSV 05/02/2007  . CONDYLOMA ACUMINATUM 05/02/2007  . HYPERTENSION 05/02/2007  . ALLERGIC RHINITIS 05/02/2007  . ASTHMA 05/02/2007  . Gastroesophageal reflux disease without esophagitis 05/02/2007  . PANCREATITIS 05/02/2007   Past Medical History:  Diagnosis Date  . Arthritis   . Boil of buttock   . Chronic bronchitis ([place])   . COPD (chronic obstructive pulmonary disease) (H[facility])    Chronic Bronchitis.Advair inhaler   . Dyspnea   . GERD (gastroesophageal reflux disease)    takes  Protonix daily  . Gout    takes Allopurinol daily  . Hypertension    takes HCTZ daily  . Pneumonia 2016    Family History  Problem Relation Age of Onset  . Adopted: Yes    Past Surgical History:  Procedure Laterality Date  . AMPUTATION Left 07/14/2014   Procedure: 2nd Toe Amputation;  Surgeon: [name], MD;  Location: [facility];  Service: Orthopedics;  Laterality: Left;  . AMPUTATION Left 11/16/2015   Procedure: Left Great Toe Amputation at Metatarsophalangeal Joint;  Surgeon: [name], MD;  Location: [facility];  Service: Orthopedics;  Laterality: Left;  . AMPUTATION Right 06/13/2016   Procedure: RIGHT 2nd Toe Amputation;  Surgeon: [name], MD;  Location: [facility];  Service: Orthopedics;  Laterality: Right;  . AMPUTATION Left 10/19/2016   Procedure: Amputation Left 3rd Toe;  Surgeon: [name], MD;  Location: [facility];  Service: Orthopedics;  Laterality: Left;  . TOE AMPUTATION Left 11/16/2015   left great toe   . TOE AMPUTATION Right 06/13/2016  . TONSILLECTOMY     Social History   Occupational History  . Not on file.   Social History Main Topics  . Smoking status: Never Smoker  . Smokeless tobacco: Never Used  . Alcohol use 0.0 oz/week     Comment: rarely  . Drug use: No  . Sexual activity: Not on file

## 2016-11-14 ENCOUNTER — Other Ambulatory Visit (INDEPENDENT_AMBULATORY_CARE_PROVIDER_SITE_OTHER): Payer: Self-pay | Admitting: Family

## 2016-11-17 ENCOUNTER — Encounter (HOSPITAL_COMMUNITY): Payer: Self-pay | Admitting: Orthopedic Surgery

## 2016-11-17 NOTE — Addendum Note (Signed)
Addendum  created 11/17/16 0553 by Oleta Mouse, MD   Anesthesia Event edited, Anesthesia Staff edited

## 2016-11-22 ENCOUNTER — Telehealth (INDEPENDENT_AMBULATORY_CARE_PROVIDER_SITE_OTHER): Payer: Self-pay | Admitting: *Deleted

## 2016-11-22 ENCOUNTER — Other Ambulatory Visit (INDEPENDENT_AMBULATORY_CARE_PROVIDER_SITE_OTHER): Payer: Self-pay

## 2016-11-22 NOTE — Telephone Encounter (Signed)
Patient called in this afternoon in regards to needing a note for work. He needs the note to state for him to be out of work until April 15th, and that he can return to work with no restrictions on April 16th. Fax Number # [phone], [place] # [phone]. Thank you

## 2016-11-22 NOTE — Telephone Encounter (Signed)
Letter written and faxed as requested.

## 2016-11-29 ENCOUNTER — Telehealth (INDEPENDENT_AMBULATORY_CARE_PROVIDER_SITE_OTHER): Payer: Self-pay | Admitting: Orthopedic Surgery

## 2016-11-29 NOTE — Telephone Encounter (Signed)
error 

## 2016-12-03 ENCOUNTER — Ambulatory Visit (INDEPENDENT_AMBULATORY_CARE_PROVIDER_SITE_OTHER): Payer: BLUE CROSS/BLUE SHIELD | Admitting: Orthopedic Surgery

## 2016-12-04 ENCOUNTER — Ambulatory Visit (INDEPENDENT_AMBULATORY_CARE_PROVIDER_SITE_OTHER): Payer: BLUE CROSS/BLUE SHIELD | Admitting: Orthopedic Surgery

## 2016-12-05 ENCOUNTER — Ambulatory Visit (INDEPENDENT_AMBULATORY_CARE_PROVIDER_SITE_OTHER): Payer: BLUE CROSS/BLUE SHIELD | Admitting: Orthopedic Surgery

## 2016-12-05 ENCOUNTER — Ambulatory Visit (INDEPENDENT_AMBULATORY_CARE_PROVIDER_SITE_OTHER): Payer: BLUE CROSS/BLUE SHIELD | Admitting: Family

## 2016-12-10 ENCOUNTER — Ambulatory Visit (INDEPENDENT_AMBULATORY_CARE_PROVIDER_SITE_OTHER): Payer: BLUE CROSS/BLUE SHIELD | Admitting: Orthopedic Surgery

## 2016-12-10 DIAGNOSIS — Z89422 Acquired absence of other left toe(s): Secondary | ICD-10-CM

## 2016-12-10 DIAGNOSIS — S98132A Complete traumatic amputation of one left lesser toe, initial encounter: Secondary | ICD-10-CM

## 2016-12-10 NOTE — Progress Notes (Signed)
Office Visit Note   Patient: Stephen Whitaker           Date of Birth: 1956/05/07           MRN: [id number] Visit Date: 12/10/2016              Requested by: Tresa Garter, MD [address] [place], [place] [address] PCP: Angelica Chessman, MD  Chief Complaint  Patient presents with  . Left Foot - Follow-up    10/19/16 Left 3rd toe amputation     HPI: The patient is a 61 year old gentleman who presents today in follow-up for amputation third toe left foot. This is well-healed. Sutures were harvested today. The patient has returned to work and states he is doing well. Phantom pain in his toes. Declines trialing Neurontin for nerve pain.  Assessment & Plan: Visit Diagnoses:  No diagnosis found.  Plan: Continue with daily foot checks. Follow-up in office as needed. Stephen follow with [facility] for fabrication of orthotics  Follow-Up Instructions: No Follow-up on file.   Ortho Exam  Patient is alert, oriented, no adenopathy, well-dressed, normal affect, normal respiratory effort. Left foot: incision is well healed. No open areas. No drainage, erythema or odor.   Imaging: No results found.  Labs: Lab Results  Component Value Date   HGBA1C 5.6 08/10/2014   ESRSEDRATE 27 (H) 07/05/2015   ESRSEDRATE 80 (H) 01/21/2015   CRP 1.8 (H) 07/05/2015   LABURIC 6.0 08/10/2014   REPTSTATUS 01/22/2015 FINAL 01/21/2015   CULT NO GROWTH Performed at [facility]  01/21/2015    Orders:  No orders of the defined types were placed in this encounter.  No orders of the defined types were placed in this encounter.    Procedures: No procedures performed  Clinical Data: No additional findings.  ROS: Review of Systems  Constitutional: Negative for chills and fever.  Cardiovascular: Negative for leg swelling.  Skin: Negative for color change and wound.    Objective: Vital Signs: There were no vitals taken for this visit.  Specialty Comments:  No specialty  comments available.  PMFS History: Patient Active Problem List   Diagnosis Date Noted  . Boil of buttock 10/24/2016  . Acute osteomyelitis of toe of left foot ([place]) 10/19/2016  . Amputated toe of left foot ([place]) 10/19/2016  . Amputated toe, right ([place] [facility]) 06/13/2016  . Chronic bronchitis ([place]) 11/24/2015  . Other pancytopenia ([place]) 01/24/2015  . Chronic pancreatitis ([place]) 01/24/2015  . Blood poisoning   . Neutropenia ([place])   . Iron deficiency anemia due to chronic blood loss   . Leukopenia   . Sepsis ([place]) 01/21/2015  . Leucopenia 01/21/2015  . Anemia 01/21/2015  . Syncope 01/21/2015  . Diarrhea 01/21/2015  . Hypokalemia 01/21/2015  . Loss of consciousness ([place])   . Eczema 10/21/2014  . Essential hypertension 10/21/2014  . Vasovagal syncope 10/21/2014  . Bilateral conjunctivitis 08/10/2014  . Other chronic pancreatitis ([place]) 08/10/2014  . COLD (chronic obstructive lung disease) ([place]) 08/10/2014  . Chronic ethmoidal sinusitis 08/10/2014  . Bronchitis 10/17/2010  . PLANTAR FASCIITIS, LEFT 10/17/2010  . OTHER NEUTROPENIA 04/21/2010  . CALLUS, TOE 03/15/2010  . LEUKOPENIA, MILD 02/20/2010  . LIVER FUNCTION TESTS, ABNORMAL, HX OF 02/16/2010  . DENTAL CARIES 02/15/2010  . ONYCHOMYCOSIS 07/05/2009  . ALLERGIC URTICARIA 07/05/2009  . PAIN IN LIMB 07/05/2009  . ERECTILE DYSFUNCTION 05/25/2008  . INFLUENZA 05/25/2008  . VIRAL INFECTION 10/01/2007  . HSV 05/02/2007  . CONDYLOMA ACUMINATUM 05/02/2007  .  HYPERTENSION 05/02/2007  . ALLERGIC RHINITIS 05/02/2007  . ASTHMA 05/02/2007  . Gastroesophageal reflux disease without esophagitis 05/02/2007  . PANCREATITIS 05/02/2007   Past Medical History:  Diagnosis Date  . Arthritis   . Boil of buttock   . Chronic bronchitis ([place])   . COPD (chronic obstructive pulmonary disease) (HCC)    Chronic Bronchitis.Advair inhaler   . Dyspnea   . GERD (gastroesophageal reflux disease)    takes Protonix daily  . Gout    takes Allopurinol  daily  . Hypertension    takes HCTZ daily  . Pneumonia 2016    Family History  Problem Relation Age of Onset  . Adopted: Yes    Past Surgical History:  Procedure Laterality Date  . AMPUTATION Left 07/14/2014   Procedure: 2nd Toe Amputation;  Surgeon: [name], MD;  Location: [facility];  Service: Orthopedics;  Laterality: Left;  . AMPUTATION Left 11/16/2015   Procedure: Left Great Toe Amputation at Metatarsophalangeal Joint;  Surgeon: [name], MD;  Location: [facility];  Service: Orthopedics;  Laterality: Left;  . AMPUTATION Right 06/13/2016   Procedure: RIGHT 2nd Toe Amputation;  Surgeon: [name], MD;  Location: [facility];  Service: Orthopedics;  Laterality: Right;  . AMPUTATION Left 10/19/2016   Procedure: Amputation Left 3rd Toe;  Surgeon: [name], MD;  Location: [facility];  Service: Orthopedics;  Laterality: Left;  . TOE AMPUTATION Left 11/16/2015   left great toe   . TOE AMPUTATION Right 06/13/2016  . TONSILLECTOMY     Social History   Occupational History  . Not on file.   Social History Main Topics  . Smoking status: Never Smoker  . Smokeless tobacco: Never Used  . Alcohol use 0.0 oz/week     Comment: rarely  . Drug use: No  . Sexual activity: Not on file

## 2016-12-13 ENCOUNTER — Ambulatory Visit: Payer: BLUE CROSS/BLUE SHIELD

## 2017-01-04 ENCOUNTER — Telehealth: Payer: Self-pay | Admitting: Internal Medicine

## 2017-01-04 NOTE — Telephone Encounter (Signed)
Patient called the office asking to speak with nurse regarding a rash that he got. Pt stated that it's on his neck and that it's burning and dry. Pt is concerned. Please follow up.  Thank you.

## 2017-01-04 NOTE — Telephone Encounter (Signed)
Called patient and informed him of what CMA stated. Pt stated that he will go to Urgent Care.

## 2017-01-04 NOTE — Telephone Encounter (Signed)
Patient should be scheduled a nurse visit or advised to visit an Urgent Care. It is 4:30 and there is no triage for the remainder of the day as well as the office being closed over the weekend.

## 2017-01-14 ENCOUNTER — Other Ambulatory Visit (INDEPENDENT_AMBULATORY_CARE_PROVIDER_SITE_OTHER): Payer: Self-pay | Admitting: Family

## 2017-01-21 ENCOUNTER — Encounter (HOSPITAL_COMMUNITY): Payer: Self-pay | Admitting: Orthopedic Surgery

## 2017-01-21 NOTE — Addendum Note (Signed)
Addendum  created 01/21/17 1157 by Oleta Mouse, MD   Sign clinical note

## 2017-01-23 ENCOUNTER — Ambulatory Visit: Payer: BLUE CROSS/BLUE SHIELD | Attending: Internal Medicine | Admitting: Internal Medicine

## 2017-01-23 ENCOUNTER — Encounter: Payer: Self-pay | Admitting: Internal Medicine

## 2017-01-23 VITALS — BP 134/70 | HR 83 | Temp 98.3°F | Resp 18 | Ht 74.0 in | Wt 203.4 lb

## 2017-01-23 DIAGNOSIS — E876 Hypokalemia: Secondary | ICD-10-CM | POA: Insufficient documentation

## 2017-01-23 DIAGNOSIS — J42 Unspecified chronic bronchitis: Secondary | ICD-10-CM

## 2017-01-23 DIAGNOSIS — M1990 Unspecified osteoarthritis, unspecified site: Secondary | ICD-10-CM | POA: Insufficient documentation

## 2017-01-23 DIAGNOSIS — Z79899 Other long term (current) drug therapy: Secondary | ICD-10-CM | POA: Diagnosis not present

## 2017-01-23 DIAGNOSIS — M109 Gout, unspecified: Secondary | ICD-10-CM | POA: Insufficient documentation

## 2017-01-23 DIAGNOSIS — L309 Dermatitis, unspecified: Secondary | ICD-10-CM | POA: Diagnosis not present

## 2017-01-23 DIAGNOSIS — K861 Other chronic pancreatitis: Secondary | ICD-10-CM

## 2017-01-23 DIAGNOSIS — Z881 Allergy status to other antibiotic agents status: Secondary | ICD-10-CM | POA: Insufficient documentation

## 2017-01-23 DIAGNOSIS — Z91013 Allergy to seafood: Secondary | ICD-10-CM | POA: Insufficient documentation

## 2017-01-23 DIAGNOSIS — Z7951 Long term (current) use of inhaled steroids: Secondary | ICD-10-CM | POA: Insufficient documentation

## 2017-01-23 DIAGNOSIS — Z1211 Encounter for screening for malignant neoplasm of colon: Secondary | ICD-10-CM

## 2017-01-23 DIAGNOSIS — I10 Essential (primary) hypertension: Secondary | ICD-10-CM

## 2017-01-23 DIAGNOSIS — Z882 Allergy status to sulfonamides status: Secondary | ICD-10-CM | POA: Diagnosis not present

## 2017-01-23 DIAGNOSIS — K219 Gastro-esophageal reflux disease without esophagitis: Secondary | ICD-10-CM | POA: Insufficient documentation

## 2017-01-23 DIAGNOSIS — Z89422 Acquired absence of other left toe(s): Secondary | ICD-10-CM | POA: Insufficient documentation

## 2017-01-23 DIAGNOSIS — J449 Chronic obstructive pulmonary disease, unspecified: Secondary | ICD-10-CM | POA: Diagnosis not present

## 2017-01-23 MED ORDER — PREDNISONE 20 MG PO TABS: 20.0000 mg | ORAL_TABLET | Freq: Every day | ORAL | 0 refills | Status: DC

## 2017-01-23 MED ORDER — FLUTICASONE PROPIONATE 50 MCG/ACT NA SUSP: 1.0000 | Freq: Every day | NASAL | 3 refills | Status: DC

## 2017-01-23 MED ORDER — FLUTICASONE-SALMETEROL 500-50 MCG/DOSE IN AEPB: 1.0000 | INHALATION_SPRAY | Freq: Two times a day (BID) | RESPIRATORY_TRACT | 3 refills | Status: DC

## 2017-01-23 MED ORDER — PANCRELIPASE (LIP-PROT-AMYL) 24000-76000 UNITS PO CPEP: 1.0000 | ORAL_CAPSULE | Freq: Three times a day (TID) | ORAL | 3 refills | Status: DC

## 2017-01-23 MED ORDER — COLCRYS 0.6 MG PO TABS: ORAL_TABLET | ORAL | 12 refills | Status: DC

## 2017-01-23 MED ORDER — MONTELUKAST SODIUM 10 MG PO TABS: 10.0000 mg | ORAL_TABLET | Freq: Every day | ORAL | 3 refills | Status: DC

## 2017-01-23 MED ORDER — PANTOPRAZOLE SODIUM 40 MG PO TBEC: 40.0000 mg | DELAYED_RELEASE_TABLET | Freq: Every day | ORAL | 3 refills | Status: DC

## 2017-01-23 MED ORDER — GUAIFENESIN-DM 100-10 MG/5ML PO SYRP: 5.0000 mL | ORAL_SOLUTION | ORAL | 0 refills | Status: DC | PRN

## 2017-01-23 MED ORDER — TRIAMCINOLONE ACETONIDE 0.5 % EX OINT: 1.0000 "application " | TOPICAL_OINTMENT | Freq: Two times a day (BID) | CUTANEOUS | 0 refills | Status: DC

## 2017-01-23 MED ORDER — ALLOPURINOL 300 MG PO TABS: 300.0000 mg | ORAL_TABLET | Freq: Every day | ORAL | 2 refills | Status: DC

## 2017-01-23 MED ORDER — HYDROCHLOROTHIAZIDE 25 MG PO TABS: 25.0000 mg | ORAL_TABLET | Freq: Every day | ORAL | 3 refills | Status: DC

## 2017-01-23 NOTE — Progress Notes (Signed)
Stephen Whitaker, is a 61 y.o. male  BCW:[id number]  WTU:[id number]  DOB - 1956-02-05  Chief Complaint  Patient presents with  . Hypertension      Subjective:   Stephen Whitaker is a 61 y.o. male with history of GERD, chronic pancreatitis on pancrelipase, HTN, chronic bronchitis and S/P left toe amputation for chronic osteomyelitis here today for a routine follow up visit and medication refills. Patient is complaining of rash and dry skin around his neck, back of knee joints and inner thigh. No change in body lotion and no known contact. Patient is adherent with medications, reports no side effect. Patient has No headache, No chest pain, No abdominal pain - No Nausea, No new weakness tingling or numbness, No Cough - SOB. Patient has not had colonoscopy.  ALLERGIES: Allergies  Allergen Reactions  . Ciprofloxacin Rash and Other (See Comments)    SYNCOPE   . Shellfish Allergy Anaphylaxis  . Sulfa Antibiotics Other (See Comments)    SYNCOPE DIZZINESS     PAST MEDICAL HISTORY: Past Medical History:  Diagnosis Date  . Arthritis   . Boil of buttock   . Chronic bronchitis ([place])   . COPD (chronic obstructive pulmonary disease) (HCC)    Chronic Bronchitis.Advair inhaler   . Dyspnea   . GERD (gastroesophageal reflux disease)    takes Protonix daily  . Gout    takes Allopurinol daily  . Hypertension    takes HCTZ daily  . Pneumonia 2016    MEDICATIONS AT HOME: Prior to Admission medications   Medication Sig Start Date End Date Taking? Authorizing Provider  albuterol (PROVENTIL HFA;VENTOLIN HFA) 108 (90 Base) MCG/ACT inhaler Inhale 2 puffs into the lungs every 6 (six) hours as needed for wheezing or shortness of breath. 10/24/16   Tresa Garter, MD  allopurinol (ZYLOPRIM) 300 MG tablet Take 1 tablet (300 mg total) by mouth daily. 01/23/17   Tresa Garter, MD  Bioflavonoid Products (ESTER-C) TABS Take 1 tablet by mouth daily.    [provider]  COLCRYS 0.6 MG  tablet TAKE ONE TABLET BY MOUTH ONCE DAILY TO TWICE DAILY AS NEEDED FOR  GOUT 01/23/17   Tresa Garter, MD  ferrous sulfate 325 (65 FE) MG EC tablet Take 325 mg by mouth daily.    [provider]  fluticasone (FLONASE) 50 MCG/ACT nasal spray Place 1 spray into both nostrils daily. 01/23/17   Tresa Garter, MD  Fluticasone-Salmeterol (ADVAIR) 500-50 MCG/DOSE AEPB Inhale 1 puff into the lungs 2 (two) times daily. 01/23/17   Tresa Garter, MD  guaiFENesin-dextromethorphan (ROBITUSSIN DM) 100-10 MG/5ML syrup Take 5 mLs by mouth every 4 (four) hours as needed for cough. 01/23/17   Tresa Garter, MD  hydrochlorothiazide (HYDRODIURIL) 25 MG tablet Take 1 tablet (25 mg total) by mouth daily. 01/23/17   Tresa Garter, MD  ibuprofen (ADVIL,MOTRIN) 800 MG tablet TAKE ONE TABLET BY MOUTH EVERY 8 HOURS AS NEEDED FOR  MODERATE  PAIN 11/15/16   Suzan Slick, NP  montelukast (SINGULAIR) 10 MG tablet Take 1 tablet (10 mg total) by mouth at bedtime. 01/23/17   Tresa Garter, MD  Multiple Vitamin (MULTIVITAMIN WITH MINERALS) TABS tablet Take 1 tablet by mouth daily. 11/24/15   Tresa Garter, MD  Pancrelipase, Lip-Prot-Amyl, (CREON) 24000-76000 units CPEP Take 1 capsule (24,000 Units total) by mouth 3 (three) times daily with meals. 01/23/17   Tresa Garter, MD  pantoprazole (PROTONIX) 40 MG tablet Take 1 tablet (40  mg total) by mouth daily. 01/23/17   Tresa Garter, MD  predniSONE (DELTASONE) 20 MG tablet Take 1 tablet (20 mg total) by mouth daily with breakfast. 01/23/17   Tresa Garter, MD  SPIRULINA PO Take 1 capsule by mouth 3 (three) times daily.    [provider]  triamcinolone ointment (KENALOG) 0.5 % Apply 1 application topically 2 (two) times daily. 01/23/17   Tresa Garter, MD    Objective:   Vitals:   01/23/17 1541  BP: 134/70  Pulse: 83  Resp: 18  Temp: 98.3 F (36.8 C)  TempSrc: Oral  SpO2: 97%  Weight: 203 lb 6.4 oz  (92.3 kg)  Height: _0  (1.88 m)   Exam General appearance : Awake, alert, not in any distress. Speech Clear. Not toxic looking HEENT: Atraumatic and Normocephalic, pupils equally reactive to light and accomodation Neck: Supple, no JVD. No cervical lymphadenopathy.  Chest: Good air entry bilaterally, no added sounds  CVS: S1 S2 regular, no murmurs.  Abdomen: Bowel sounds present, Non tender and not distended with no gaurding, rigidity or rebound. Extremities: B/L Lower Ext shows no edema, both legs are warm to touch Neurology: Awake alert, and oriented X 3, CN II-XII intact, Non focal Skin: Eczematous rash  Data Review Lab Results  Component Value Date   HGBA1C 5.6 08/10/2014    Assessment & Plan   1. Essential hypertension  - hydrochlorothiazide (HYDRODIURIL) 25 MG tablet; Take 1 tablet (25 mg total) by mouth daily.  Dispense: 90 tablet; Refill: 3  - CBC with Differential/Platelet - TSH - Urinalysis, Complete - VITAMIN D 25 Hydroxy (Vit-D Deficiency, Fractures) - Lipid panel  2. Chronic bronchitis, unspecified chronic bronchitis type (HCC)  - fluticasone (FLONASE) 50 MCG/ACT nasal spray; Place 1 spray into both nostrils daily.  Dispense: 16 g; Refill: 3 - Fluticasone-Salmeterol (ADVAIR) 500-50 MCG/DOSE AEPB; Inhale 1 puff into the lungs 2 (two) times daily.  Dispense: 60 each; Refill: 3 - montelukast (SINGULAIR) 10 MG tablet; Take 1 tablet (10 mg total) by mouth at bedtime.  Dispense: 30 tablet; Refill: 3 - predniSONE (DELTASONE) 20 MG tablet; Take 1 tablet (20 mg total) by mouth daily with breakfast.  Dispense: 5 tablet; Refill: 0 - guaiFENesin-dextromethorphan (ROBITUSSIN DM) 100-10 MG/5ML syrup; Take 5 mLs by mouth every 4 (four) hours as needed for cough.  Dispense: 118 mL; Refill: 0  3. Hypokalemia  - CMP14+EGFR  4. Colon cancer screening  - Ambulatory referral to Gastroenterology for colon cancer screening  5. Eczema, unspecified type  - triamcinolone  ointment (KENALOG) 0.5 %; Apply 1 application topically 2 (two) times daily.  Dispense: 30 g; Refill: 0  6. Other chronic pancreatitis (HCC)  - Pancrelipase, Lip-Prot-Amyl, (CREON) 24000-76000 units CPEP; Take 1 capsule (24,000 Units total) by mouth 3 (three) times daily with meals.  Dispense: 180 capsule; Refill: 3  7. Gastroesophageal reflux disease without esophagitis  - pantoprazole (PROTONIX) 40 MG tablet; Take 1 tablet (40 mg total) by mouth daily.  Dispense: 90 tablet; Refill: 3  Patient have been counseled extensively about nutrition and exercise. Other issues discussed during this visit include: low cholesterol diet, weight control and daily exercise, importance of adherence with medications and regular follow-up. We also discussed long term complications of uncontrolled hypertension.   Return in about 3 months (around 04/25/2017) for Follow up HTN, Follow up Pain and comorbidities.  The patient was given clear instructions to go to ER or return to medical center if symptoms don't improve, worsen  or new problems develop. The patient verbalized understanding. The patient was told to call to get lab results if they haven't heard anything in the next week.   This note has been created with Surveyor, quantity. Any transcriptional errors are unintentional.    Angelica Chessman, MD, [place], [place], [place], [place] [facility] [place], [place]   01/23/2017, 4:18 PM

## 2017-01-23 NOTE — Patient Instructions (Signed)
DASH Eating Plan DASH stands for "Dietary Approaches to Stop Hypertension." The DASH eating plan is a healthy eating plan that has been shown to reduce high blood pressure (hypertension). It may also reduce your risk for type 2 diabetes, heart disease, and stroke. The DASH eating plan may also help with weight loss. What are tips for following this plan? General guidelines  Avoid eating more than 2,300 mg (milligrams) of salt (sodium) a day. If you have hypertension, you may need to reduce your sodium intake to 1,500 mg a day.  Limit alcohol intake to no more than 1 drink a day for nonpregnant women and 2 drinks a day for men. One drink equals 12 oz of beer, 5 oz of wine, or 1 oz of hard liquor.  Work with your health care provider to maintain a healthy body weight or to lose weight. Ask what an ideal weight is for you.  Get at least 30 minutes of exercise that causes your heart to beat faster (aerobic exercise) most days of the week. Activities may include walking, swimming, or biking.  Work with your health care provider or diet and nutrition specialist (dietitian) to adjust your eating plan to your individual calorie needs. Reading food labels  Check food labels for the amount of sodium per serving. Choose foods with less than 5 percent of the Daily Value of sodium. Generally, foods with less than 300 mg of sodium per serving fit into this eating plan.  To find whole grains, look for the word "whole" as the first word in the ingredient list. Shopping  Buy products labeled as "low-sodium" or "no salt added."  Buy fresh foods. Avoid canned foods and premade or frozen meals. Cooking  Avoid adding salt when cooking. Use salt-free seasonings or herbs instead of table salt or sea salt. Check with your health care provider or pharmacist before using salt substitutes.  Do not fry foods. Cook foods using healthy methods such as baking, boiling, grilling, and broiling instead.  Cook with  heart-healthy oils, such as olive, canola, soybean, or sunflower oil. Meal planning   Eat a balanced diet that includes: ? 5 or more servings of fruits and vegetables each day. At each meal, try to fill half of your plate with fruits and vegetables. ? Up to 6-8 servings of whole grains each day. ? Less than 6 oz of lean meat, poultry, or fish each day. A 3-oz serving of meat is about the same size as a deck of cards. One egg equals 1 oz. ? 2 servings of low-fat dairy each day. ? A serving of nuts, seeds, or beans 5 times each week. ? Heart-healthy fats. Healthy fats called Omega-3 fatty acids are found in foods such as flaxseeds and coldwater fish, like sardines, salmon, and mackerel.  Limit how much you eat of the following: ? Canned or prepackaged foods. ? Food that is high in trans fat, such as fried foods. ? Food that is high in saturated fat, such as fatty meat. ? Sweets, desserts, sugary drinks, and other foods with added sugar. ? Full-fat dairy products.  Do not salt foods before eating.  Try to eat at least 2 vegetarian meals each week.  Eat more home-cooked food and less restaurant, buffet, and fast food.  When eating at a restaurant, ask that your food be prepared with less salt or no salt, if possible. What foods are recommended? The items listed may not be a complete list. Talk with your dietitian about what   dietary choices are best for you. Grains Whole-grain or whole-wheat bread. Whole-grain or whole-wheat pasta. Brown rice. Oatmeal. Quinoa. Bulgur. Whole-grain and low-sodium cereals. Pita bread. Low-fat, low-sodium crackers. Whole-wheat flour tortillas. Vegetables Fresh or frozen vegetables (raw, steamed, roasted, or grilled). Low-sodium or reduced-sodium tomato and vegetable juice. Low-sodium or reduced-sodium tomato sauce and tomato paste. Low-sodium or reduced-sodium canned vegetables. Fruits All fresh, dried, or frozen fruit. Canned fruit in natural juice (without  added sugar). Meat and other protein foods Skinless chicken or turkey. Ground chicken or turkey. Pork with fat trimmed off. Fish and seafood. Egg whites. Dried beans, peas, or lentils. Unsalted nuts, nut butters, and seeds. Unsalted canned beans. Lean cuts of beef with fat trimmed off. Low-sodium, lean deli meat. Dairy Low-fat (1%) or fat-free (skim) milk. Fat-free, low-fat, or reduced-fat cheeses. Nonfat, low-sodium ricotta or cottage cheese. Low-fat or nonfat yogurt. Low-fat, low-sodium cheese. Fats and oils Soft margarine without trans fats. Vegetable oil. Low-fat, reduced-fat, or light mayonnaise and salad dressings (reduced-sodium). Canola, safflower, olive, soybean, and sunflower oils. Avocado. Seasoning and other foods Herbs. Spices. Seasoning mixes without salt. Unsalted popcorn and pretzels. Fat-free sweets. What foods are not recommended? The items listed may not be a complete list. Talk with your dietitian about what dietary choices are best for you. Grains Baked goods made with fat, such as croissants, muffins, or some breads. Dry pasta or rice meal packs. Vegetables Creamed or fried vegetables. Vegetables in a cheese sauce. Regular canned vegetables (not low-sodium or reduced-sodium). Regular canned tomato sauce and paste (not low-sodium or reduced-sodium). Regular tomato and vegetable juice (not low-sodium or reduced-sodium). Pickles. Olives. Fruits Canned fruit in a light or heavy syrup. Fried fruit. Fruit in cream or butter sauce. Meat and other protein foods Fatty cuts of meat. Ribs. Fried meat. Bacon. Sausage. Bologna and other processed lunch meats. Salami. Fatback. Hotdogs. Bratwurst. Salted nuts and seeds. Canned beans with added salt. Canned or smoked fish. Whole eggs or egg yolks. Chicken or turkey with skin. Dairy Whole or 2% milk, cream, and half-and-half. Whole or full-fat cream cheese. Whole-fat or sweetened yogurt. Full-fat cheese. Nondairy creamers. Whipped toppings.  Processed cheese and cheese spreads. Fats and oils Butter. Stick margarine. Lard. Shortening. Ghee. Bacon fat. Tropical oils, such as coconut, palm kernel, or palm oil. Seasoning and other foods Salted popcorn and pretzels. Onion salt, garlic salt, seasoned salt, table salt, and sea salt. Worcestershire sauce. Tartar sauce. Barbecue sauce. Teriyaki sauce. Soy sauce, including reduced-sodium. Steak sauce. Canned and packaged gravies. Fish sauce. Oyster sauce. Cocktail sauce. Horseradish that you find on the shelf. Ketchup. Mustard. Meat flavorings and tenderizers. Bouillon cubes. Hot sauce and Tabasco sauce. Premade or packaged marinades. Premade or packaged taco seasonings. Relishes. Regular salad dressings. Where to find more information:  National Heart, Lung, and Blood Institute: www.nhlbi.nih.gov  American Heart Association: www.heart.org Summary  The DASH eating plan is a healthy eating plan that has been shown to reduce high blood pressure (hypertension). It may also reduce your risk for type 2 diabetes, heart disease, and stroke.  With the DASH eating plan, you should limit salt (sodium) intake to 2,300 mg a day. If you have hypertension, you may need to reduce your sodium intake to 1,500 mg a day.  When on the DASH eating plan, aim to eat more fresh fruits and vegetables, whole grains, lean proteins, low-fat dairy, and heart-healthy fats.  Work with your health care provider or diet and nutrition specialist (dietitian) to adjust your eating plan to your individual   calorie needs. This information is not intended to replace advice given to you by your health care provider. Make sure you discuss any questions you have with your health care provider. Document Released: 07/26/2011 Document Revised: 07/30/2016 Document Reviewed: 07/30/2016 Elsevier Interactive Patient Education  2017 [employer] Hypertension Hypertension, commonly called high blood pressure, is when the force of blood  pumping through the arteries is too strong. The arteries are the blood vessels that carry blood from the heart throughout the body. Hypertension forces the heart to work harder to pump blood and may cause arteries to become narrow or stiff. Having untreated or uncontrolled hypertension can cause heart attacks, strokes, kidney disease, and other problems. A blood pressure reading consists of a higher number over a lower number. Ideally, your blood pressure should be below 120/80. The first ("top") number is called the systolic pressure. It is a measure of the pressure in your arteries as your heart beats. The second ("bottom") number is called the diastolic pressure. It is a measure of the pressure in your arteries as the heart relaxes. What are the causes? The cause of this condition is not known. What increases the risk? Some risk factors for high blood pressure are under your control. Others are not. Factors you can change  Smoking.  Having type 2 diabetes mellitus, high cholesterol, or both.  Not getting enough exercise or physical activity.  Being overweight.  Having too much fat, sugar, calories, or salt (sodium) in your diet.  Drinking too much alcohol. Factors that are difficult or impossible to change  Having chronic kidney disease.  Having a family history of high blood pressure.  Age. Risk increases with age.  Race. You may be at higher risk if you are African-American.  Gender. Men are at higher risk than women before age 45. After age 65, women are at higher risk than men.  Having obstructive sleep apnea.  Stress. What are the signs or symptoms? Extremely high blood pressure (hypertensive crisis) may cause:  Headache.  Anxiety.  Shortness of breath.  Nosebleed.  Nausea and vomiting.  Severe chest pain.  Jerky movements you cannot control (seizures).  How is this diagnosed? This condition is diagnosed by measuring your blood pressure while you are  seated, with your arm resting on a surface. The cuff of the blood pressure monitor will be placed directly against the skin of your upper arm at the level of your heart. It should be measured at least twice using the same arm. Certain conditions can cause a difference in blood pressure between your right and left arms. Certain factors can cause blood pressure readings to be lower or higher than normal (elevated) for a short period of time:  When your blood pressure is higher when you are in a health care provider's office than when you are at home, this is called white coat hypertension. Most people with this condition do not need medicines.  When your blood pressure is higher at home than when you are in a health care provider's office, this is called masked hypertension. Most people with this condition may need medicines to control blood pressure.  If you have a high blood pressure reading during one visit or you have normal blood pressure with other risk factors:  You may be asked to return on a different day to have your blood pressure checked again.  You may be asked to monitor your blood pressure at home for 1 week or longer.  If you are diagnosed   with hypertension, you may have other blood or imaging tests to help your health care provider understand your overall risk for other conditions. How is this treated? This condition is treated by making healthy lifestyle changes, such as eating healthy foods, exercising more, and reducing your alcohol intake. Your health care provider may prescribe medicine if lifestyle changes are not enough to get your blood pressure under control, and if:  Your systolic blood pressure is above 130.  Your diastolic blood pressure is above 80.  Your personal target blood pressure may vary depending on your medical conditions, your age, and other factors. Follow these instructions at home: Eating and drinking  Eat a diet that is high in fiber and potassium,  and low in sodium, added sugar, and fat. An example eating plan is called the DASH (Dietary Approaches to Stop Hypertension) diet. To eat this way: ? Eat plenty of fresh fruits and vegetables. Try to fill half of your plate at each meal with fruits and vegetables. ? Eat whole grains, such as whole wheat pasta, brown rice, or whole grain bread. Fill about one quarter of your plate with whole grains. ? Eat or drink low-fat dairy products, such as skim milk or low-fat yogurt. ? Avoid fatty cuts of meat, processed or cured meats, and poultry with skin. Fill about one quarter of your plate with lean proteins, such as fish, chicken without skin, beans, eggs, and tofu. ? Avoid premade and processed foods. These tend to be higher in sodium, added sugar, and fat.  Reduce your daily sodium intake. Most people with hypertension should eat less than 1,500 mg of sodium a day.  Limit alcohol intake to no more than 1 drink a day for nonpregnant women and 2 drinks a day for men. One drink equals 12 oz of beer, 5 oz of wine, or 1 oz of hard liquor. Lifestyle  Work with your health care provider to maintain a healthy body weight or to lose weight. Ask what an ideal weight is for you.  Get at least 30 minutes of exercise that causes your heart to beat faster (aerobic exercise) most days of the week. Activities may include walking, swimming, or biking.  Include exercise to strengthen your muscles (resistance exercise), such as pilates or lifting weights, as part of your weekly exercise routine. Try to do these types of exercises for 30 minutes at least 3 days a week.  Do not use any products that contain nicotine or tobacco, such as cigarettes and e-cigarettes. If you need help quitting, ask your health care provider.  Monitor your blood pressure at home as told by your health care provider.  Keep all follow-up visits as told by your health care provider. This is important. Medicines  Take over-the-counter and  prescription medicines only as told by your health care provider. Follow directions carefully. Blood pressure medicines must be taken as prescribed.  Do not skip doses of blood pressure medicine. Doing this puts you at risk for problems and can make the medicine less effective.  Ask your health care provider about side effects or reactions to medicines that you should watch for. Contact a health care provider if:  You think you are having a reaction to a medicine you are taking.  You have headaches that keep coming back (recurring).  You feel dizzy.  You have swelling in your ankles.  You have trouble with your vision. Get help right away if:  You develop a severe headache or confusion.  You   have unusual weakness or numbness.  You feel faint.  You have severe pain in your chest or abdomen.  You vomit repeatedly.  You have trouble breathing. Summary  Hypertension is when the force of blood pumping through your arteries is too strong. If this condition is not controlled, it may put you at risk for serious complications.  Your personal target blood pressure may vary depending on your medical conditions, your age, and other factors. For most people, a normal blood pressure is less than 120/80.  Hypertension is treated with lifestyle changes, medicines, or a combination of both. Lifestyle changes include weight loss, eating a healthy, low-sodium diet, exercising more, and limiting alcohol. This information is not intended to replace advice given to you by your health care provider. Make sure you discuss any questions you have with your health care provider. Document Released: 08/06/2005 Document Revised: 07/04/2016 Document Reviewed: 07/04/2016 Elsevier Interactive Patient Education  2018 [employer]. Eczema Eczema, also called atopic dermatitis, is a skin disorder that causes inflammation of the skin. It causes a red rash and dry, scaly skin. The skin becomes very itchy. Eczema  is generally worse during the cooler winter months and often improves with the warmth of summer. Eczema usually starts showing signs in infancy. Some children outgrow eczema, but it may last through adulthood. What are the causes? The exact cause of eczema is not known, but it appears to run in families. People with eczema often have a family history of eczema, allergies, asthma, or hay fever. Eczema is not contagious. Flare-ups of the condition may be caused by:  Contact with something you are sensitive or allergic to.  Stress.  What are the signs or symptoms?  Dry, scaly skin.  Red, itchy rash.  Itchiness. This may occur before the skin rash and may be very intense. How is this diagnosed? The diagnosis of eczema is usually made based on symptoms and medical history. How is this treated? Eczema cannot be cured, but symptoms usually can be controlled with treatment and other strategies. A treatment plan might include:  Controlling the itching and scratching. ? Use over-the-counter antihistamines as directed for itching. This is especially useful at night when the itching tends to be worse. ? Use over-the-counter steroid creams as directed for itching. ? Avoid scratching. Scratching makes the rash and itching worse. It may also result in a skin infection (impetigo) due to a break in the skin caused by scratching.  Keeping the skin well moisturized with creams every day. This will seal in moisture and help prevent dryness. Lotions that contain alcohol and water should be avoided because they can dry the skin.  Limiting exposure to things that you are sensitive or allergic to (allergens).  Recognizing situations that cause stress.  Developing a plan to manage stress.  Follow these instructions at home:  Only take over-the-counter or prescription medicines as directed by your health care provider.  Do not use anything on the skin without checking with your health care  provider.  Keep baths or showers short (5 minutes) in warm (not hot) water. Use mild cleansers for bathing. These should be unscented. You may add nonperfumed bath oil to the bath water. It is best to avoid soap and bubble bath.  Immediately after a bath or shower, when the skin is still damp, apply a moisturizing ointment to the entire body. This ointment should be a petroleum ointment. This will seal in moisture and help prevent dryness. The thicker the ointment, the better.  These should be unscented.  Keep fingernails cut short. Children with eczema may need to wear soft gloves or mittens at night after applying an ointment.  Dress in clothes made of cotton or cotton blends. Dress lightly, because heat increases itching.  A child with eczema should stay away from anyone with fever blisters or cold sores. The virus that causes fever blisters (herpes simplex) can cause a serious skin infection in children with eczema. Contact a health care provider if:  Your itching interferes with sleep.  Your rash gets worse or is not better within 1 week after starting treatment.  You see pus or soft yellow scabs in the rash area.  You have a fever.  You have a rash flare-up after contact with someone who has fever blisters. This information is not intended to replace advice given to you by your health care provider. Make sure you discuss any questions you have with your health care provider. Document Released: 08/03/2000 Document Revised: 01/12/2016 Document Reviewed: 03/09/2013 Elsevier Interactive Patient Education  2017 [employer].

## 2017-01-23 NOTE — Progress Notes (Signed)
Patient is here for HTN FU  Patient complains of rash/dry skin around the neck area, back of knees and inner thighs.

## 2017-01-24 LAB — CMP14+EGFR
A/G RATIO: 1.3 (ref 1.2–2.2)
ALT: 57 IU/L — AB (ref 0–44)
AST: 76 IU/L — ABNORMAL HIGH (ref 0–40)
Albumin: 3.9 g/dL (ref 3.6–4.8)
Alkaline Phosphatase: 100 IU/L (ref 39–117)
BUN/Creatinine Ratio: 15 (ref 10–24)
BUN: 14 mg/dL (ref 8–27)
Bilirubin Total: 0.2 mg/dL (ref 0.0–1.2)
CALCIUM: 8.9 mg/dL (ref 8.6–10.2)
CO2: 28 mmol/L (ref 18–29)
Chloride: 103 mmol/L (ref 96–106)
Creatinine, Ser: 0.93 mg/dL (ref 0.76–1.27)
GFR calc Af Amer: 101 mL/min/{1.73_m2} (ref >59)
GFR, EST NON AFRICAN AMERICAN: 88 mL/min/{1.73_m2} (ref >59)
Globulin, Total: 3.1 g/dL (ref 1.5–4.5)
Glucose: 84 mg/dL (ref 65–99)
POTASSIUM: 3.7 mmol/L (ref 3.5–5.2)
Sodium: 144 mmol/L (ref 134–144)
Total Protein: 7 g/dL (ref 6.0–8.5)

## 2017-01-24 LAB — CBC WITH DIFFERENTIAL/PLATELET
Basophils Absolute: 0 10*3/uL (ref 0.0–0.2)
Basos: 1 %
EOS (ABSOLUTE): 0.1 10*3/uL (ref 0.0–0.4)
Eos: 3 %
Hematocrit: 39.6 % (ref 37.5–51.0)
Hemoglobin: 12.4 g/dL — ABNORMAL LOW (ref 13.0–17.7)
IMMATURE GRANULOCYTES: 0 %
Immature Grans (Abs): 0 10*3/uL (ref 0.0–0.1)
Lymphocytes Absolute: 0.8 10*3/uL (ref 0.7–3.1)
Lymphs: 29 %
MCH: 27.3 pg (ref 26.6–33.0)
MCHC: 31.3 g/dL — ABNORMAL LOW (ref 31.5–35.7)
MCV: 87 fL (ref 79–97)
MONOS ABS: 0.5 10*3/uL (ref 0.1–0.9)
Monocytes: 16 %
NEUTROS PCT: 51 %
Neutrophils Absolute: 1.4 10*3/uL (ref 1.4–7.0)
PLATELETS: 247 10*3/uL (ref 150–379)
RBC: 4.54 x10E6/uL (ref 4.14–5.80)
RDW: 15.5 % — AB (ref 12.3–15.4)
WBC: 2.9 10*3/uL — AB (ref 3.4–10.8)

## 2017-01-24 LAB — MICROSCOPIC EXAMINATION
Bacteria, UA: NONE SEEN
CASTS: NONE SEEN /LPF

## 2017-01-24 LAB — URINALYSIS, COMPLETE
Bilirubin, UA: NEGATIVE
Glucose, UA: NEGATIVE
Ketones, UA: NEGATIVE
LEUKOCYTES UA: NEGATIVE
Nitrite, UA: NEGATIVE
RBC UA: NEGATIVE
SPEC GRAV UA: 1.022 (ref 1.005–1.030)
Urobilinogen, Ur: 0.2 mg/dL (ref 0.2–1.0)
pH, UA: 5.5 (ref 5.0–7.5)

## 2017-01-24 LAB — LIPID PANEL
CHOL/HDL RATIO: 3.8 ratio (ref 0.0–5.0)
Cholesterol, Total: 126 mg/dL (ref 100–199)
HDL: 33 mg/dL — ABNORMAL LOW (ref >39)
LDL Calculated: 67 mg/dL (ref 0–99)
TRIGLYCERIDES: 129 mg/dL (ref 0–149)
VLDL Cholesterol Cal: 26 mg/dL (ref 5–40)

## 2017-01-24 LAB — VITAMIN D 25 HYDROXY (VIT D DEFICIENCY, FRACTURES): Vit D, 25-Hydroxy: 32.4 ng/mL (ref 30.0–100.0)

## 2017-01-24 LAB — TSH: TSH: 2.12 u[IU]/mL (ref 0.450–4.500)

## 2017-01-28 ENCOUNTER — Other Ambulatory Visit (INDEPENDENT_AMBULATORY_CARE_PROVIDER_SITE_OTHER): Payer: Self-pay | Admitting: Family

## 2017-02-06 ENCOUNTER — Telehealth: Payer: Self-pay | Admitting: Internal Medicine

## 2017-02-06 NOTE — Telephone Encounter (Signed)
Pt calling to get results. Requests a call back from PCP or CMA. Please f/u. Thank you.

## 2017-02-07 NOTE — Telephone Encounter (Signed)
Patient verified DOB Patient is aware of labs being mostly normal except for liver enzyme being elevated sightly and hemoglobin being slightly low. Patient is aware of results being monitored and a recheck being completed at the next visit. No further questions at this time.

## 2017-02-07 NOTE — Telephone Encounter (Signed)
-----   Message from Tresa Garter, MD sent at 01/28/2017 10:49 AM EDT ----- Please inform patient that his lab results are mostly normal. Hemoglobin is slightly low but better than previous results. Liver enzyme slightly elevated, please avoid alcohol and tylenol overuse. We will continue to monitor.

## 2017-02-25 ENCOUNTER — Telehealth: Payer: Self-pay

## 2017-02-25 NOTE — Telephone Encounter (Signed)
Patient No Showed for PV today. Left message for patient to call and reschedule PV. If we do not hear from him by 5 Pm, we will cancel colonoscopy that is scheduled for 03/11/17. A no show letter will be sent if he does not call by 5 Pm.   Riki Sheer, LPN

## 2017-02-26 ENCOUNTER — Ambulatory Visit (AMBULATORY_SURGERY_CENTER): Payer: Self-pay

## 2017-02-26 VITALS — Ht 74.5 in | Wt 201.6 lb

## 2017-02-26 DIAGNOSIS — Z1211 Encounter for screening for malignant neoplasm of colon: Secondary | ICD-10-CM

## 2017-02-26 MED ORDER — NA SULFATE-K SULFATE-MG SULF 17.5-3.13-1.6 GM/177ML PO SOLN: ORAL | 0 refills | Status: DC

## 2017-02-26 NOTE — Progress Notes (Signed)
Per pt, no allergies to soy or egg products.Pt not taking any weight loss meds or using  O2 at home.  Pt unable to watch Emmi video due to no computer.

## 2017-03-11 ENCOUNTER — Encounter: Payer: BLUE CROSS/BLUE SHIELD | Admitting: Internal Medicine

## 2017-03-18 ENCOUNTER — Other Ambulatory Visit: Payer: Self-pay | Admitting: Internal Medicine

## 2017-03-18 DIAGNOSIS — L309 Dermatitis, unspecified: Secondary | ICD-10-CM

## 2017-04-25 ENCOUNTER — Ambulatory Visit (AMBULATORY_SURGERY_CENTER): Payer: BLUE CROSS/BLUE SHIELD | Admitting: Internal Medicine

## 2017-04-25 ENCOUNTER — Encounter: Payer: Self-pay | Admitting: Internal Medicine

## 2017-04-25 VITALS — BP 106/71 | HR 81 | Temp 98.9°F | Resp 21 | Ht 74.0 in | Wt 203.0 lb

## 2017-04-25 DIAGNOSIS — D126 Benign neoplasm of colon, unspecified: Secondary | ICD-10-CM

## 2017-04-25 DIAGNOSIS — D123 Benign neoplasm of transverse colon: Secondary | ICD-10-CM

## 2017-04-25 DIAGNOSIS — K635 Polyp of colon: Secondary | ICD-10-CM

## 2017-04-25 DIAGNOSIS — Z1211 Encounter for screening for malignant neoplasm of colon: Secondary | ICD-10-CM

## 2017-04-25 DIAGNOSIS — D122 Benign neoplasm of ascending colon: Secondary | ICD-10-CM

## 2017-04-25 MED ORDER — SODIUM CHLORIDE 0.9 % IV SOLN: 500.0000 mL | INTRAVENOUS | Status: DC

## 2017-04-25 NOTE — Progress Notes (Signed)
Called to room to assist during endoscopic procedure.  Patient ID and intended procedure confirmed with present staff. Received instructions for my participation in the procedure from the performing physician.  

## 2017-04-25 NOTE — Op Note (Signed)
[facility] Patient Name: Stephen Whitaker Procedure Date: 04/25/2017 10:22 AM MRN: [id number] Endoscopist: Jerene Bears , MD Age: 61 Referring MD:  Date of Birth: September 29, 1955 Gender: Male Account #: [id number] Procedure:                Colonoscopy Indications:              Screening for colorectal malignant neoplasm, This                            is the patient's first colonoscopy Medicines:                Monitored Anesthesia Care Procedure:                Pre-Anesthesia Assessment:                           - Prior to the procedure, a History and Physical                            was performed, and patient medications and                            allergies were reviewed. The patient's tolerance of                            previous anesthesia was also reviewed. The risks                            and benefits of the procedure and the sedation                            options and risks were discussed with the patient.                            All questions were answered, and informed consent                            was obtained. Prior Anticoagulants: The patient has                            taken no previous anticoagulant or antiplatelet                            agents. ASA Grade Assessment: III - A patient with                            severe systemic disease. After reviewing the risks                            and benefits, the patient was deemed in                            satisfactory condition to undergo the procedure.  After obtaining informed consent, the colonoscope                            was passed under direct vision. Throughout the                            procedure, the patient's blood pressure, pulse, and                            oxygen saturations were monitored continuously. The                            Colonoscope was introduced through the anus and                            advanced to the the  cecum, identified by                            appendiceal orifice and ileocecal valve. The                            colonoscopy was performed without difficulty. The                            patient tolerated the procedure well. The quality                            of the bowel preparation was excellent. The                            ileocecal valve, appendiceal orifice, and rectum                            were photographed. Scope In: 10:34:33 AM Scope Out: 10:47:15 AM Scope Withdrawal Time: 0 hours 9 minutes 40 seconds  Total Procedure Duration: 0 hours 12 minutes 42 seconds  Findings:                 The perianal exam findings include skin tags.                           Two sessile polyps were found in the hepatic                            flexure and ascending colon. The polyps were 3 to 4                            mm in size. These polyps were removed with a cold                            snare. Resection and retrieval were complete.                           Internal hemorrhoids were found during  retroflexion. The hemorrhoids were small.                           The exam was otherwise without abnormality. Complications:            No immediate complications. Estimated Blood Loss:     Estimated blood loss was minimal. Impression:               - Perianal skin tags found on perianal exam.                           - Two 3 to 4 mm polyps at the hepatic flexure and                            in the ascending colon, removed with a cold snare.                            Resected and retrieved.                           - Internal hemorrhoids.                           - The examination was otherwise normal. Recommendation:           - Patient has a contact number available for                            emergencies. The signs and symptoms of potential                            delayed complications were discussed with the                             patient. Return to normal activities tomorrow.                            Written discharge instructions were provided to the                            patient.                           - Resume previous diet.                           - Continue present medications.                           - Await pathology results.                           - Repeat colonoscopy is recommended. The                            colonoscopy date will be determined after pathology  results from today's exam become available for                            review. Jerene Bears, MD 04/25/2017 10:50:03 AM This report has been signed electronically.

## 2017-04-25 NOTE — Progress Notes (Signed)
Pt's states no medical or surgical changes since previsit or office visit. 

## 2017-04-25 NOTE — Patient Instructions (Signed)
YOU HAD AN ENDOSCOPIC PROCEDURE TODAY AT [facility]:   Refer to the procedure report that was given to you for any specific questions about what was found during the examination.  If the procedure report does not answer your questions, please call your gastroenterologist to clarify.  If you requested that your care partner not be given the details of your procedure findings, then the procedure report has been included in a sealed envelope for you to review at your convenience later.  YOU SHOULD EXPECT: Some feelings of bloating in the abdomen. Passage of more gas than usual.  Walking can help get rid of the air that was put into your GI tract during the procedure and reduce the bloating. If you had a lower endoscopy (such as a colonoscopy or flexible sigmoidoscopy) you may notice spotting of blood in your stool or on the toilet paper. If you underwent a bowel prep for your procedure, you may not have a normal bowel movement for a few days.  Please Note:  You might notice some irritation and congestion in your nose or some drainage.  This is from the oxygen used during your procedure.  There is no need for concern and it should clear up in a day or so.  SYMPTOMS TO REPORT IMMEDIATELY:   Following lower endoscopy (colonoscopy or flexible sigmoidoscopy):  Excessive amounts of blood in the stool  Significant tenderness or worsening of abdominal pains  Swelling of the abdomen that is new, acute  Fever of 100F or higher   For urgent or emergent issues, a gastroenterologist can be reached at any hour by calling [phone].   DIET:  We do recommend a small meal at first, but then you may proceed to your regular diet.  Drink plenty of fluids but you should avoid alcoholic beverages for 24 hours.  ACTIVITY:  You should plan to take it easy for the rest of today and you should NOT DRIVE or use heavy machinery until tomorrow (because of the sedation medicines used during the test).     FOLLOW UP: Our staff will call the number listed on your records the next business day following your procedure to check on you and address any questions or concerns that you may have regarding the information given to you following your procedure. If we do not reach you, we will leave a message.  However, if you are feeling well and you are not experiencing any problems, there is no need to return our call.  We will assume that you have returned to your regular daily activities without incident.  If any biopsies were taken you will be contacted by phone or by letter within the next 1-3 weeks.  Please call us at [phone] if you have not heard about the biopsies in 3 weeks.    SIGNATURES/CONFIDENTIALITY: You and/or your care partner have signed paperwork which will be entered into your electronic medical record.  These signatures attest to the fact that that the information above on your After Visit Summary has been reviewed and is understood.  Full responsibility of the confidentiality of this discharge information lies with you and/or your care-partner.  Polyp, hemorrhoid and hemorrhoid banding information given.

## 2017-04-25 NOTE — Progress Notes (Signed)
To recovery, report to RN, VSS. 

## 2017-04-26 ENCOUNTER — Telehealth: Payer: Self-pay

## 2017-04-26 NOTE — Telephone Encounter (Signed)
  Follow up Call-  Call back number 04/25/2017  Post procedure Call Back phone  # [phone]  Permission to leave phone message Yes  Some recent data might be hidden     Patient questions:  Do you have a fever, pain , or abdominal swelling? No. Pain Score  0 *  Have you tolerated food without any problems? Yes.    Have you been able to return to your normal activities? Yes.    Do you have any questions about your discharge instructions: Diet   No. Medications  No. Follow up visit  No.  Do you have questions or concerns about your Care? No.  Actions: * If pain score is 4 or above: No action needed, pain <4.

## 2017-04-30 ENCOUNTER — Encounter: Payer: Self-pay | Admitting: Internal Medicine

## 2017-05-08 ENCOUNTER — Ambulatory Visit: Payer: BLUE CROSS/BLUE SHIELD | Admitting: Internal Medicine

## 2017-05-09 ENCOUNTER — Other Ambulatory Visit (INDEPENDENT_AMBULATORY_CARE_PROVIDER_SITE_OTHER): Payer: Self-pay | Admitting: Family

## 2017-05-22 ENCOUNTER — Encounter: Payer: Self-pay | Admitting: Internal Medicine

## 2017-05-22 ENCOUNTER — Ambulatory Visit: Payer: BLUE CROSS/BLUE SHIELD | Attending: Internal Medicine | Admitting: Internal Medicine

## 2017-05-22 VITALS — BP 134/82 | HR 76 | Temp 98.1°F | Resp 18 | Ht 74.5 in | Wt 200.0 lb

## 2017-05-22 DIAGNOSIS — L309 Dermatitis, unspecified: Secondary | ICD-10-CM

## 2017-05-22 DIAGNOSIS — J42 Unspecified chronic bronchitis: Secondary | ICD-10-CM | POA: Diagnosis not present

## 2017-05-22 DIAGNOSIS — Z760 Encounter for issue of repeat prescription: Secondary | ICD-10-CM | POA: Diagnosis not present

## 2017-05-22 DIAGNOSIS — Z79899 Other long term (current) drug therapy: Secondary | ICD-10-CM | POA: Diagnosis not present

## 2017-05-22 DIAGNOSIS — K219 Gastro-esophageal reflux disease without esophagitis: Secondary | ICD-10-CM

## 2017-05-22 DIAGNOSIS — I10 Essential (primary) hypertension: Secondary | ICD-10-CM | POA: Insufficient documentation

## 2017-05-22 DIAGNOSIS — M109 Gout, unspecified: Secondary | ICD-10-CM | POA: Diagnosis not present

## 2017-05-22 DIAGNOSIS — K861 Other chronic pancreatitis: Secondary | ICD-10-CM | POA: Diagnosis not present

## 2017-05-22 MED ORDER — PANCRELIPASE (LIP-PROT-AMYL) 24000-76000 UNITS PO CPEP: 1.0000 | ORAL_CAPSULE | Freq: Three times a day (TID) | ORAL | 3 refills | Status: DC

## 2017-05-22 MED ORDER — FLUTICASONE PROPIONATE 50 MCG/ACT NA SUSP: 1.0000 | Freq: Every day | NASAL | 3 refills | Status: DC

## 2017-05-22 MED ORDER — PANTOPRAZOLE SODIUM 40 MG PO TBEC: 40.0000 mg | DELAYED_RELEASE_TABLET | Freq: Every day | ORAL | 3 refills | Status: DC

## 2017-05-22 MED ORDER — TRIAMCINOLONE ACETONIDE 0.5 % EX OINT: TOPICAL_OINTMENT | CUTANEOUS | 1 refills | Status: DC

## 2017-05-22 MED ORDER — COLCRYS 0.6 MG PO TABS: ORAL_TABLET | ORAL | 12 refills | Status: DC

## 2017-05-22 MED ORDER — FLUTICASONE-SALMETEROL 500-50 MCG/DOSE IN AEPB: 1.0000 | INHALATION_SPRAY | Freq: Two times a day (BID) | RESPIRATORY_TRACT | 3 refills | Status: DC

## 2017-05-22 MED ORDER — HYDROCHLOROTHIAZIDE 25 MG PO TABS: 25.0000 mg | ORAL_TABLET | Freq: Every day | ORAL | 3 refills | Status: DC

## 2017-05-22 MED ORDER — MONTELUKAST SODIUM 10 MG PO TABS: 10.0000 mg | ORAL_TABLET | Freq: Every day | ORAL | 3 refills | Status: DC

## 2017-05-22 MED ORDER — ALLOPURINOL 300 MG PO TABS: 300.0000 mg | ORAL_TABLET | Freq: Every day | ORAL | 2 refills | Status: DC

## 2017-05-22 NOTE — Patient Instructions (Signed)
DASH Eating Plan DASH stands for "Dietary Approaches to Stop Hypertension." The DASH eating plan is a healthy eating plan that has been shown to reduce high blood pressure (hypertension). It may also reduce your risk for type 2 diabetes, heart disease, and stroke. The DASH eating plan may also help with weight loss. What are tips for following this plan? General guidelines  Avoid eating more than 2,300 mg (milligrams) of salt (sodium) a day. If you have hypertension, you may need to reduce your sodium intake to 1,500 mg a day.  Limit alcohol intake to no more than 1 drink a day for nonpregnant women and 2 drinks a day for men. One drink equals 12 oz of beer, 5 oz of wine, or 1 oz of hard liquor.  Work with your health care provider to maintain a healthy body weight or to lose weight. Ask what an ideal weight is for you.  Get at least 30 minutes of exercise that causes your heart to beat faster (aerobic exercise) most days of the week. Activities may include walking, swimming, or biking.  Work with your health care provider or diet and nutrition specialist (dietitian) to adjust your eating plan to your individual calorie needs. Reading food labels  Check food labels for the amount of sodium per serving. Choose foods with less than 5 percent of the Daily Value of sodium. Generally, foods with less than 300 mg of sodium per serving fit into this eating plan.  To find whole grains, look for the word "whole" as the first word in the ingredient list. Shopping  Buy products labeled as "low-sodium" or "no salt added."  Buy fresh foods. Avoid canned foods and premade or frozen meals. Cooking  Avoid adding salt when cooking. Use salt-free seasonings or herbs instead of table salt or sea salt. Check with your health care provider or pharmacist before using salt substitutes.  Do not fry foods. Cook foods using healthy methods such as baking, boiling, grilling, and broiling instead.  Cook with  heart-healthy oils, such as olive, canola, soybean, or sunflower oil. Meal planning   Eat a balanced diet that includes: ? 5 or more servings of fruits and vegetables each day. At each meal, try to fill half of your plate with fruits and vegetables. ? Up to 6-8 servings of whole grains each day. ? Less than 6 oz of lean meat, poultry, or fish each day. A 3-oz serving of meat is about the same size as a deck of cards. One egg equals 1 oz. ? 2 servings of low-fat dairy each day. ? A serving of nuts, seeds, or beans 5 times each week. ? Heart-healthy fats. Healthy fats called Omega-3 fatty acids are found in foods such as flaxseeds and coldwater fish, like sardines, salmon, and mackerel.  Limit how much you eat of the following: ? Canned or prepackaged foods. ? Food that is high in trans fat, such as fried foods. ? Food that is high in saturated fat, such as fatty meat. ? Sweets, desserts, sugary drinks, and other foods with added sugar. ? Full-fat dairy products.  Do not salt foods before eating.  Try to eat at least 2 vegetarian meals each week.  Eat more home-cooked food and less restaurant, buffet, and fast food.  When eating at a restaurant, ask that your food be prepared with less salt or no salt, if possible. What foods are recommended? The items listed may not be a complete list. Talk with your dietitian about what   dietary choices are best for you. Grains Whole-grain or whole-wheat bread. Whole-grain or whole-wheat pasta. Brown rice. Oatmeal. Quinoa. Bulgur. Whole-grain and low-sodium cereals. Pita bread. Low-fat, low-sodium crackers. Whole-wheat flour tortillas. Vegetables Fresh or frozen vegetables (raw, steamed, roasted, or grilled). Low-sodium or reduced-sodium tomato and vegetable juice. Low-sodium or reduced-sodium tomato sauce and tomato paste. Low-sodium or reduced-sodium canned vegetables. Fruits All fresh, dried, or frozen fruit. Canned fruit in natural juice (without  added sugar). Meat and other protein foods Skinless chicken or turkey. Ground chicken or turkey. Pork with fat trimmed off. Fish and seafood. Egg whites. Dried beans, peas, or lentils. Unsalted nuts, nut butters, and seeds. Unsalted canned beans. Lean cuts of beef with fat trimmed off. Low-sodium, lean deli meat. Dairy Low-fat (1%) or fat-free (skim) milk. Fat-free, low-fat, or reduced-fat cheeses. Nonfat, low-sodium ricotta or cottage cheese. Low-fat or nonfat yogurt. Low-fat, low-sodium cheese. Fats and oils Soft margarine without trans fats. Vegetable oil. Low-fat, reduced-fat, or light mayonnaise and salad dressings (reduced-sodium). Canola, safflower, olive, soybean, and sunflower oils. Avocado. Seasoning and other foods Herbs. Spices. Seasoning mixes without salt. Unsalted popcorn and pretzels. Fat-free sweets. What foods are not recommended? The items listed may not be a complete list. Talk with your dietitian about what dietary choices are best for you. Grains Baked goods made with fat, such as croissants, muffins, or some breads. Dry pasta or rice meal packs. Vegetables Creamed or fried vegetables. Vegetables in a cheese sauce. Regular canned vegetables (not low-sodium or reduced-sodium). Regular canned tomato sauce and paste (not low-sodium or reduced-sodium). Regular tomato and vegetable juice (not low-sodium or reduced-sodium). Pickles. Olives. Fruits Canned fruit in a light or heavy syrup. Fried fruit. Fruit in cream or butter sauce. Meat and other protein foods Fatty cuts of meat. Ribs. Fried meat. Bacon. Sausage. Bologna and other processed lunch meats. Salami. Fatback. Hotdogs. Bratwurst. Salted nuts and seeds. Canned beans with added salt. Canned or smoked fish. Whole eggs or egg yolks. Chicken or turkey with skin. Dairy Whole or 2% milk, cream, and half-and-half. Whole or full-fat cream cheese. Whole-fat or sweetened yogurt. Full-fat cheese. Nondairy creamers. Whipped toppings.  Processed cheese and cheese spreads. Fats and oils Butter. Stick margarine. Lard. Shortening. Ghee. Bacon fat. Tropical oils, such as coconut, palm kernel, or palm oil. Seasoning and other foods Salted popcorn and pretzels. Onion salt, garlic salt, seasoned salt, table salt, and sea salt. Worcestershire sauce. Tartar sauce. Barbecue sauce. Teriyaki sauce. Soy sauce, including reduced-sodium. Steak sauce. Canned and packaged gravies. Fish sauce. Oyster sauce. Cocktail sauce. Horseradish that you find on the shelf. Ketchup. Mustard. Meat flavorings and tenderizers. Bouillon cubes. Hot sauce and Tabasco sauce. Premade or packaged marinades. Premade or packaged taco seasonings. Relishes. Regular salad dressings. Where to find more information:  National Heart, Lung, and Blood Institute: www.nhlbi.nih.gov  American Heart Association: www.heart.org Summary  The DASH eating plan is a healthy eating plan that has been shown to reduce high blood pressure (hypertension). It may also reduce your risk for type 2 diabetes, heart disease, and stroke.  With the DASH eating plan, you should limit salt (sodium) intake to 2,300 mg a day. If you have hypertension, you may need to reduce your sodium intake to 1,500 mg a day.  When on the DASH eating plan, aim to eat more fresh fruits and vegetables, whole grains, lean proteins, low-fat dairy, and heart-healthy fats.  Work with your health care provider or diet and nutrition specialist (dietitian) to adjust your eating plan to your individual   calorie needs. This information is not intended to replace advice given to you by your health care provider. Make sure you discuss any questions you have with your health care provider. Document Released: 07/26/2011 Document Revised: 07/30/2016 Document Reviewed: 07/30/2016 Elsevier Interactive Patient Education  2017 [employer] Hypertension Hypertension, commonly called high blood pressure, is when the force of blood  pumping through the arteries is too strong. The arteries are the blood vessels that carry blood from the heart throughout the body. Hypertension forces the heart to work harder to pump blood and may cause arteries to become narrow or stiff. Having untreated or uncontrolled hypertension can cause heart attacks, strokes, kidney disease, and other problems. A blood pressure reading consists of a higher number over a lower number. Ideally, your blood pressure should be below 120/80. The first ("top") number is called the systolic pressure. It is a measure of the pressure in your arteries as your heart beats. The second ("bottom") number is called the diastolic pressure. It is a measure of the pressure in your arteries as the heart relaxes. What are the causes? The cause of this condition is not known. What increases the risk? Some risk factors for high blood pressure are under your control. Others are not. Factors you can change  Smoking.  Having type 2 diabetes mellitus, high cholesterol, or both.  Not getting enough exercise or physical activity.  Being overweight.  Having too much fat, sugar, calories, or salt (sodium) in your diet.  Drinking too much alcohol. Factors that are difficult or impossible to change  Having chronic kidney disease.  Having a family history of high blood pressure.  Age. Risk increases with age.  Race. You may be at higher risk if you are African-American.  Gender. Men are at higher risk than women before age 45. After age 65, women are at higher risk than men.  Having obstructive sleep apnea.  Stress. What are the signs or symptoms? Extremely high blood pressure (hypertensive crisis) may cause:  Headache.  Anxiety.  Shortness of breath.  Nosebleed.  Nausea and vomiting.  Severe chest pain.  Jerky movements you cannot control (seizures).  How is this diagnosed? This condition is diagnosed by measuring your blood pressure while you are  seated, with your arm resting on a surface. The cuff of the blood pressure monitor will be placed directly against the skin of your upper arm at the level of your heart. It should be measured at least twice using the same arm. Certain conditions can cause a difference in blood pressure between your right and left arms. Certain factors can cause blood pressure readings to be lower or higher than normal (elevated) for a short period of time:  When your blood pressure is higher when you are in a health care provider's office than when you are at home, this is called white coat hypertension. Most people with this condition do not need medicines.  When your blood pressure is higher at home than when you are in a health care provider's office, this is called masked hypertension. Most people with this condition may need medicines to control blood pressure.  If you have a high blood pressure reading during one visit or you have normal blood pressure with other risk factors:  You may be asked to return on a different day to have your blood pressure checked again.  You may be asked to monitor your blood pressure at home for 1 week or longer.  If you are diagnosed   with hypertension, you may have other blood or imaging tests to help your health care provider understand your overall risk for other conditions. How is this treated? This condition is treated by making healthy lifestyle changes, such as eating healthy foods, exercising more, and reducing your alcohol intake. Your health care provider may prescribe medicine if lifestyle changes are not enough to get your blood pressure under control, and if:  Your systolic blood pressure is above 130.  Your diastolic blood pressure is above 80.  Your personal target blood pressure may vary depending on your medical conditions, your age, and other factors. Follow these instructions at home: Eating and drinking  Eat a diet that is high in fiber and potassium,  and low in sodium, added sugar, and fat. An example eating plan is called the DASH (Dietary Approaches to Stop Hypertension) diet. To eat this way: ? Eat plenty of fresh fruits and vegetables. Try to fill half of your plate at each meal with fruits and vegetables. ? Eat whole grains, such as whole wheat pasta, brown rice, or whole grain bread. Fill about one quarter of your plate with whole grains. ? Eat or drink low-fat dairy products, such as skim milk or low-fat yogurt. ? Avoid fatty cuts of meat, processed or cured meats, and poultry with skin. Fill about one quarter of your plate with lean proteins, such as fish, chicken without skin, beans, eggs, and tofu. ? Avoid premade and processed foods. These tend to be higher in sodium, added sugar, and fat.  Reduce your daily sodium intake. Most people with hypertension should eat less than 1,500 mg of sodium a day.  Limit alcohol intake to no more than 1 drink a day for nonpregnant women and 2 drinks a day for men. One drink equals 12 oz of beer, 5 oz of wine, or 1 oz of hard liquor. Lifestyle  Work with your health care provider to maintain a healthy body weight or to lose weight. Ask what an ideal weight is for you.  Get at least 30 minutes of exercise that causes your heart to beat faster (aerobic exercise) most days of the week. Activities may include walking, swimming, or biking.  Include exercise to strengthen your muscles (resistance exercise), such as pilates or lifting weights, as part of your weekly exercise routine. Try to do these types of exercises for 30 minutes at least 3 days a week.  Do not use any products that contain nicotine or tobacco, such as cigarettes and e-cigarettes. If you need help quitting, ask your health care provider.  Monitor your blood pressure at home as told by your health care provider.  Keep all follow-up visits as told by your health care provider. This is important. Medicines  Take over-the-counter and  prescription medicines only as told by your health care provider. Follow directions carefully. Blood pressure medicines must be taken as prescribed.  Do not skip doses of blood pressure medicine. Doing this puts you at risk for problems and can make the medicine less effective.  Ask your health care provider about side effects or reactions to medicines that you should watch for. Contact a health care provider if:  You think you are having a reaction to a medicine you are taking.  You have headaches that keep coming back (recurring).  You feel dizzy.  You have swelling in your ankles.  You have trouble with your vision. Get help right away if:  You develop a severe headache or confusion.  You   have unusual weakness or numbness.  You feel faint.  You have severe pain in your chest or abdomen.  You vomit repeatedly.  You have trouble breathing. Summary  Hypertension is when the force of blood pumping through your arteries is too strong. If this condition is not controlled, it may put you at risk for serious complications.  Your personal target blood pressure may vary depending on your medical conditions, your age, and other factors. For most people, a normal blood pressure is less than 120/80.  Hypertension is treated with lifestyle changes, medicines, or a combination of both. Lifestyle changes include weight loss, eating a healthy, low-sodium diet, exercising more, and limiting alcohol. This information is not intended to replace advice given to you by your health care provider. Make sure you discuss any questions you have with your health care provider. Document Released: 08/06/2005 Document Revised: 07/04/2016 Document Reviewed: 07/04/2016 Elsevier Interactive Patient Education  2018 [employer]  

## 2017-05-22 NOTE — Progress Notes (Signed)
Stephen Whitaker, is a 61 y.o. male  IHK:[id number]  VFI:[id number]  DOB - 1956/03/31  Chief Complaint  Patient presents with  . Hypertension      Subjective:   Stephen Whitaker is a 61 y.o. male with history of GERD, chronic pancreatitis on pancrelipase, HTN, chronic bronchitis and S/P left toe amputation for chronic osteomyelitis here today for a routine follow up visit and medication refills. Patient is adherent with medications, reports no side effect. Patient has no new complaint today except for his dry skin. Patient has No headache, No chest pain, No abdominal pain - No Nausea, No new weakness tingling or numbness, No Cough - SOB.  ALLERGIES: Allergies  Allergen Reactions  . Ciprofloxacin Rash and Other (See Comments)    SYNCOPE   . Shellfish Allergy Anaphylaxis  . Sulfa Antibiotics Other (See Comments)    SYNCOPE DIZZINESS     PAST MEDICAL HISTORY: Past Medical History:  Diagnosis Date  . Arthritis   . Boil of buttock   . Chronic bronchitis ([place])   . COPD (chronic obstructive pulmonary disease) (HCC)    Chronic Bronchitis.Advair inhaler   . Dyspnea   . GERD (gastroesophageal reflux disease)    takes Protonix daily  . Gout    takes Allopurinol daily  . Hemorrhoids   . Hypertension    takes HCTZ daily  . Pancreatitis    10 years ago  . Pneumonia 2016    MEDICATIONS AT HOME: Prior to Admission medications   Medication Sig Start Date End Date Taking? Authorizing Provider  albuterol (PROVENTIL HFA;VENTOLIN HFA) 108 (90 Base) MCG/ACT inhaler Inhale 2 puffs into the lungs every 6 (six) hours as needed for wheezing or shortness of breath. 10/24/16  Yes Tresa Garter, MD  allopurinol (ZYLOPRIM) 300 MG tablet Take 1 tablet (300 mg total) by mouth daily. 05/22/17  Yes Tresa Garter, MD  Bioflavonoid Products (ESTER-C) TABS Take 1 tablet by mouth daily.   Yes [provider]  COLCRYS 0.6 MG tablet TAKE ONE TABLET BY MOUTH ONCE DAILY TO TWICE  DAILY AS NEEDED FOR  GOUT 05/22/17  Yes Dnya Hickle E, MD  ferrous sulfate 325 (65 FE) MG EC tablet Take 325 mg by mouth daily.   Yes [provider]  fluticasone (FLONASE) 50 MCG/ACT nasal spray Place 1 spray into both nostrils daily. 05/22/17  Yes Parissa Chiao, Marlena Clipper, MD  Fluticasone-Salmeterol (ADVAIR) 500-50 MCG/DOSE AEPB Inhale 1 puff into the lungs 2 (two) times daily. 05/22/17  Yes Tresa Garter, MD  hydrochlorothiazide (HYDRODIURIL) 25 MG tablet Take 1 tablet (25 mg total) by mouth daily. 05/22/17  Yes Tresa Garter, MD  ibuprofen (ADVIL,MOTRIN) 800 MG tablet TAKE 1 TABLET BY MOUTH EVERY 8 HOURS AS NEEDED FOR  MODERATE  PAIN 05/09/17  Yes [name], MD  montelukast (SINGULAIR) 10 MG tablet Take 1 tablet (10 mg total) by mouth at bedtime. 05/22/17  Yes Tresa Garter, MD  Multiple Vitamin (MULTIVITAMIN WITH MINERALS) TABS tablet Take 1 tablet by mouth daily. 11/24/15  Yes Owen Pratte E, MD  Pancrelipase, Lip-Prot-Amyl, (CREON) 24000-76000 units CPEP Take 1 capsule (24,000 Units total) by mouth 3 (three) times daily with meals. 05/22/17  Yes Tresa Garter, MD  pantoprazole (PROTONIX) 40 MG tablet Take 1 tablet (40 mg total) by mouth daily. 05/22/17  Yes Chrystal Zeimet E, MD  SPIRULINA PO Take 1 capsule by mouth 3 (three) times daily.   Yes [provider]  triamcinolone ointment (KENALOG) 0.5 %  APPLY 1 APPLICATION TOPICALLY TWICE DAILY 05/22/17  Yes Tresa Garter, MD    Objective:   Vitals:   05/22/17 1609  BP: 134/82  Pulse: 76  Resp: 18  Temp: 98.1 F (36.7 C)  TempSrc: Oral  SpO2: 100%  Weight: 200 lb (90.7 kg)  Height: 6' 2.5" (1.892 m)   Exam General appearance : Awake, alert, not in any distress. Speech Clear. Not toxic looking HEENT: Atraumatic and Normocephalic, pupils equally reactive to light and accomodation Neck: Supple, no JVD. No cervical lymphadenopathy.  Chest: Good air entry bilaterally, no added  sounds  CVS: S1 S2 regular, no murmurs.  Abdomen: Bowel sounds present, Non tender and not distended with no gaurding, rigidity or rebound. Extremities: B/L Lower Ext shows no edema, both legs are warm to touch Neurology: Awake alert, and oriented X 3, CN II-XII intact, Non focal Skin: No Rash  Data Review Lab Results  Component Value Date   HGBA1C 5.6 08/10/2014    Assessment & Plan   1. Essential hypertension  - hydrochlorothiazide (HYDRODIURIL) 25 MG tablet; Take 1 tablet (25 mg total) by mouth daily.  Dispense: 90 tablet; Refill: 3  We have discussed target BP range and blood pressure goal. I have advised patient to check BP regularly and to call us back or report to clinic if the numbers are consistently higher than 140/90. We discussed the importance of compliance with medical therapy and DASH diet recommended, consequences of uncontrolled hypertension discussed.  - continue current BP medications  2. Eczema, unspecified type  - triamcinolone ointment (KENALOG) 0.5 %; APPLY 1 APPLICATION TOPICALLY TWICE DAILY  Dispense: 30 g; Refill: 1  3. Gastroesophageal reflux disease without esophagitis  - pantoprazole (PROTONIX) 40 MG tablet; Take 1 tablet (40 mg total) by mouth daily.  Dispense: 90 tablet; Refill: 3  4. Other chronic pancreatitis (HCC)  - Pancrelipase, Lip-Prot-Amyl, (CREON) 24000-76000 units CPEP; Take 1 capsule (24,000 Units total) by mouth 3 (three) times daily with meals.  Dispense: 180 capsule; Refill: 3  5. Chronic bronchitis, unspecified chronic bronchitis type (HCC)  - montelukast (SINGULAIR) 10 MG tablet; Take 1 tablet (10 mg total) by mouth at bedtime.  Dispense: 30 tablet; Refill: 3 - Fluticasone-Salmeterol (ADVAIR) 500-50 MCG/DOSE AEPB; Inhale 1 puff into the lungs 2 (two) times daily.  Dispense: 60 each; Refill: 3 - fluticasone (FLONASE) 50 MCG/ACT nasal spray; Place 1 spray into both nostrils daily.  Dispense: 16 g; Refill: 3  Patient have been  counseled extensively about nutrition and exercise. Other issues discussed during this visit include: low cholesterol diet, weight control and daily exercise, importance of adherence with medications and regular follow-up. We also discussed long term complications of uncontrolled hypertension.   Return in about 6 months (around 11/20/2017) for Follow up HTN, COPD.  The patient was given clear instructions to go to ER or return to medical center if symptoms don't improve, worsen or new problems develop. The patient verbalized understanding. The patient was told to call to get lab results if they haven't heard anything in the next week.   This note has been created with Surveyor, quantity. Any transcriptional errors are unintentional.    Angelica Chessman, MD, [place], [place], [place], [place] and [place], [place]   05/22/2017, 4:51 PM

## 2017-05-23 ENCOUNTER — Telehealth: Payer: Self-pay | Admitting: Internal Medicine

## 2017-05-23 NOTE — Telephone Encounter (Signed)
Patient received this medication refill on yesterday at his visit.

## 2017-05-23 NOTE — Telephone Encounter (Signed)
Pt called to request a refill for  loratadine (CLARITIN) 10 MG tablet  But according to the med list, this med has been discontinue  Pt like to speak with the nurse or the pcp to explain him why, please follow up

## 2017-06-13 ENCOUNTER — Ambulatory Visit (INDEPENDENT_AMBULATORY_CARE_PROVIDER_SITE_OTHER): Payer: BLUE CROSS/BLUE SHIELD

## 2017-06-13 ENCOUNTER — Encounter (INDEPENDENT_AMBULATORY_CARE_PROVIDER_SITE_OTHER): Payer: Self-pay | Admitting: Family

## 2017-06-13 ENCOUNTER — Ambulatory Visit (INDEPENDENT_AMBULATORY_CARE_PROVIDER_SITE_OTHER): Payer: BLUE CROSS/BLUE SHIELD | Admitting: Family

## 2017-06-13 DIAGNOSIS — M79674 Pain in right toe(s): Secondary | ICD-10-CM | POA: Diagnosis not present

## 2017-06-13 NOTE — Progress Notes (Signed)
Office Visit Note   Patient: Stephen Whitaker           Date of Birth: 1955/11/05           MRN: [id number] Visit Date: 06/13/2017              Requested by: Tresa Garter, MD [address] [place], [place] [address] PCP: Tresa Garter, MD  Chief Complaint  Patient presents with  . Right Foot - Pain      HPI: The patient is a 61 year old gentleman seen today complaining of pain to distal right third toe. States is discolored underneath. Has returned to work in [employer] thinks boots maybe putting pressure on his toe. Aching pain to tip of toe with ambulation. No open ulcer.  He is concerned for osteomyelitis as had several toe amputations in past.   Assessment & Plan: Visit Diagnoses:  1. Pain in right toe(s)     Plan: nails trimmed on right x 4. Callus pared to 3rd toe. Reassurance provided. Work on heel cord stretching. Recommended arch supports. Discussed return precautions.  Follow-Up Instructions: Return in about 2 weeks (around 06/27/2017).   Ortho Exam  Patient is alert, oriented, no adenopathy, well-dressed, normal affect, normal respiratory effort. On examination of right foot the 3rd toe is tender distally to plantar aspect. Has some ecchymosis, 4 mm in diameter. Build up of callus to tip, has flexible clawing of the toe. No erythema, drainage, no open ulcer. No warmth. Does have thickened and onychomycotic nails to right foot x 4. Unable to safely trim own nails.   Imaging: Xr Toe 3rd Right  Result Date: 06/13/2017 Radiographs of right 3rd toe show no bony abnormality. Does have cystic changes to first and second metatarsal heads consistent with gout.   No images are attached to the encounter.  Labs: Lab Results  Component Value Date   HGBA1C 5.6 08/10/2014   ESRSEDRATE 27 (H) 07/05/2015   ESRSEDRATE 80 (H) 01/21/2015   CRP 1.8 (H) 07/05/2015   LABURIC 6.0 08/10/2014   REPTSTATUS 01/22/2015 FINAL 01/21/2015   CULT NO  GROWTH Performed at [facility]  01/21/2015    Orders:  Orders Placed This Encounter  Procedures  . XR Toe 3rd Right   No orders of the defined types were placed in this encounter.    Procedures: No procedures performed  Clinical Data: No additional findings.  ROS:  All other systems negative, except as noted in the HPI. Review of Systems  Objective: Vital Signs: There were no vitals taken for this visit.  Specialty Comments:  No specialty comments available.  PMFS History: Patient Active Problem List   Diagnosis Date Noted  . Boil of buttock 10/24/2016  . Acute osteomyelitis of toe of left foot ([place]) 10/19/2016  . Amputated toe of left foot ([place]) 10/19/2016  . Amputated toe, right ([place]) 06/13/2016  . Chronic bronchitis ([place]) 11/24/2015  . Other pancytopenia ([place]) 01/24/2015  . Chronic pancreatitis ([place]) 01/24/2015  . Blood poisoning   . Neutropenia ([place])   . Iron deficiency anemia due to chronic blood loss   . Leukopenia   . Sepsis ([place]) 01/21/2015  . Leucopenia 01/21/2015  . Anemia 01/21/2015  . Syncope 01/21/2015  . Diarrhea 01/21/2015  . Hypokalemia 01/21/2015  . Loss of consciousness ([place])   . Eczema 10/21/2014  . Essential hypertension 10/21/2014  . Vasovagal syncope 10/21/2014  . Bilateral conjunctivitis 08/10/2014  . Other chronic pancreatitis ([place]) 08/10/2014  .  COLD (chronic obstructive lung disease) ([place]) 08/10/2014  . Chronic ethmoidal sinusitis 08/10/2014  . Bronchitis 10/17/2010  . PLANTAR FASCIITIS, LEFT 10/17/2010  . OTHER NEUTROPENIA 04/21/2010  . CALLUS, TOE 03/15/2010  . LEUKOPENIA, MILD 02/20/2010  . LIVER FUNCTION TESTS, ABNORMAL, HX OF 02/16/2010  . DENTAL CARIES 02/15/2010  . ONYCHOMYCOSIS 07/05/2009  . ALLERGIC URTICARIA 07/05/2009  . PAIN IN LIMB 07/05/2009  . ERECTILE DYSFUNCTION 05/25/2008  . INFLUENZA 05/25/2008  . VIRAL INFECTION 10/01/2007  . HSV 05/02/2007  . CONDYLOMA ACUMINATUM 05/02/2007  .  HYPERTENSION 05/02/2007  . ALLERGIC RHINITIS 05/02/2007  . ASTHMA 05/02/2007  . Gastroesophageal reflux disease without esophagitis 05/02/2007  . PANCREATITIS 05/02/2007   Past Medical History:  Diagnosis Date  . Arthritis   . Boil of buttock   . Chronic bronchitis ([place])   . COPD (chronic obstructive pulmonary disease) (HCC)    Chronic Bronchitis.Advair inhaler   . Dyspnea   . GERD (gastroesophageal reflux disease)    takes Protonix daily  . Gout    takes Allopurinol daily  . Hemorrhoids   . Hypertension    takes HCTZ daily  . Pancreatitis    10 years ago  . Pneumonia 2016    Family History  Problem Relation Age of Onset  . Adopted: Yes    Past Surgical History:  Procedure Laterality Date  . AMPUTATION Left 07/14/2014   Procedure: 2nd Toe Amputation;  Surgeon: [name], MD;  Location: [facility];  Service: Orthopedics;  Laterality: Left;  . AMPUTATION Left 11/16/2015   Procedure: Left Great Toe Amputation at Metatarsophalangeal Joint;  Surgeon: [name], MD;  Location: [facility];  Service: Orthopedics;  Laterality: Left;  . AMPUTATION Right 06/13/2016   Procedure: RIGHT 2nd Toe Amputation;  Surgeon: [name], MD;  Location: [facility];  Service: Orthopedics;  Laterality: Right;  . AMPUTATION Left 10/19/2016   Procedure: Amputation Left 3rd Toe;  Surgeon: [name], MD;  Location: [facility];  Service: Orthopedics;  Laterality: Left;  . TOE AMPUTATION Left 11/16/2015   left great toe   . TOE AMPUTATION Right 06/13/2016  . TONSILLECTOMY     Social History   Occupational History  . Not on file.   Social History Main Topics  . Smoking status: Never Smoker  . Smokeless tobacco: Never Used  . Alcohol use 0.0 oz/week     Comment: rarely  . Drug use: No  . Sexual activity: Not on file

## 2017-06-27 ENCOUNTER — Other Ambulatory Visit: Payer: Self-pay | Admitting: Internal Medicine

## 2017-07-27 ENCOUNTER — Other Ambulatory Visit: Payer: Self-pay | Admitting: Internal Medicine

## 2017-09-18 ENCOUNTER — Encounter (HOSPITAL_COMMUNITY): Payer: Self-pay | Admitting: Emergency Medicine

## 2017-09-18 ENCOUNTER — Ambulatory Visit (HOSPITAL_COMMUNITY)
Admission: EM | Admit: 2017-09-18 | Discharge: 2017-09-18 | Disposition: A | Discharge status: Home / Self Care | Payer: BLUE CROSS/BLUE SHIELD | Attending: Family Medicine | Admitting: Family Medicine

## 2017-09-18 ENCOUNTER — Other Ambulatory Visit: Payer: Self-pay

## 2017-09-18 DIAGNOSIS — R05 Cough: Secondary | ICD-10-CM | POA: Diagnosis not present

## 2017-09-18 DIAGNOSIS — R059 Cough, unspecified: Secondary | ICD-10-CM

## 2017-09-18 DIAGNOSIS — J209 Acute bronchitis, unspecified: Secondary | ICD-10-CM

## 2017-09-18 MED ORDER — PREDNISONE 10 MG (21) PO TBPK: ORAL_TABLET | ORAL | 0 refills | Status: DC

## 2017-09-18 MED ORDER — AZITHROMYCIN 250 MG PO TABS: 250.0000 mg | ORAL_TABLET | Freq: Every day | ORAL | 0 refills | Status: DC

## 2017-09-18 NOTE — ED Triage Notes (Signed)
Pt states he gets recurrent Bronchitis.  Pt here today for three days of cough and congestion, not relieved by Ibuprofen and Guaifenesin.

## 2017-09-19 ENCOUNTER — Ambulatory Visit: Payer: BLUE CROSS/BLUE SHIELD

## 2017-09-19 NOTE — ED Provider Notes (Addendum)
[facility]   [id number] 09/18/17 Arrival Time: 4235  ASSESSMENT & PLAN:  1. Acute bronchitis, unspecified organism   2. Cough     Meds ordered this encounter  Medications  . predniSONE (STERAPRED UNI-PAK 21 TAB) 10 MG (21) TBPK tablet    Sig: Take 6-5-4-3-2-1 po qd    Dispense:  21 tablet    Refill:  0    Order Specific Question:   Supervising Provider    AnswerVanessa Kick [[id number]]  . azithromycin (ZITHROMAX) 250 MG tablet    Sig: Take 1 tablet (250 mg total) by mouth daily. Take first 2 tablets together, then 1 every day until finished.    Dispense:  6 tablet    Refill:  0    Order Specific Question:   Supervising Provider    Answer:   Vanessa Kick [[id number]]    Reviewed expectations re: course of current medical issues. Questions answered. Outlined signs and symptoms indicating need for more acute intervention. Patient verbalized understanding. After Visit Summary given.   SUBJECTIVE: History from: patient. Josemiguel TERIUS JACUINDE is a 62 y.o. male who presents with complaint of persistent cough and uri sx's. Reports gradual onset a week ago. Described symptoms have gradually worsened since beginning.  ROS: As per HPI.   OBJECTIVE:  Vitals:   09/18/17 1732  BP: (!) 153/95  Pulse: (!) 110  Temp: 99.3 F (37.4 C)  TempSrc: Oral  SpO2: 98%    General appearance: alert; no distress Eyes: PERRLA; EOMI; conjunctiva normal HENT: normocephalic; atraumatic; TMs normal; nasal mucosa normal; oral mucosa normal Neck: supple  Lungs: clear to auscultation bilaterally Heart: regular rate and rhythm Abdomen: soft, non-tender; bowel sounds normal; no masses or organomegaly; no guarding or rebound tenderness Back: no CVA tenderness Extremities: no cyanosis or edema; symmetrical with no gross deformities Skin: warm and dry Neurologic: normal gait; normal symmetric reflexes Psychological: alert and cooperative; normal mood and affect  Labs:  Labs Reviewed  - No data to display  Imaging: No results found.  Allergies  Allergen Reactions  . Ciprofloxacin Rash and Other (See Comments)    SYNCOPE   . Shellfish Allergy Anaphylaxis  . Sulfa Antibiotics Other (See Comments)    SYNCOPE DIZZINESS     Past Medical History:  Diagnosis Date  . Arthritis   . Boil of buttock   . Chronic bronchitis ([place])   . COPD (chronic obstructive pulmonary disease) (HCC)    Chronic Bronchitis.Advair inhaler   . Dyspnea   . GERD (gastroesophageal reflux disease)    takes Protonix daily  . Gout    takes Allopurinol daily  . Hemorrhoids   . Hypertension    takes HCTZ daily  . Pancreatitis    10 years ago  . Pneumonia 2016   Social History   Socioeconomic History  . Marital status: Single    Spouse name: Not on file  . Number of children: Not on file  . Years of education: Not on file  . Highest education level: Not on file  Social Needs  . Financial resource strain: Not on file  . Food insecurity - worry: Not on file  . Food insecurity - inability: Not on file  . Transportation needs - medical: Not on file  . Transportation needs - non-medical: Not on file  Occupational History  . Not on file  Tobacco Use  . Smoking status: Never Smoker  . Smokeless tobacco: Never Used  Substance and Sexual Activity  . Alcohol  use: Yes    Alcohol/week: 0.0 oz    Comment: rarely  . Drug use: No  . Sexual activity: Not on file  Other Topics Concern  . Not on file  Social History Narrative  . Not on file   Family History  Adopted: Yes   Past Surgical History:  Procedure Laterality Date  . AMPUTATION Left 07/14/2014   Procedure: 2nd Toe Amputation;  Surgeon: [name], MD;  Location: [facility];  Service: Orthopedics;  Laterality: Left;  . AMPUTATION Left 11/16/2015   Procedure: Left Great Toe Amputation at Metatarsophalangeal Joint;  Surgeon: [name], MD;  Location: [facility];  Service: Orthopedics;  Laterality: Left;  . AMPUTATION Right  06/13/2016   Procedure: RIGHT 2nd Toe Amputation;  Surgeon: [name], MD;  Location: [facility];  Service: Orthopedics;  Laterality: Right;  . AMPUTATION Left 10/19/2016   Procedure: Amputation Left 3rd Toe;  Surgeon: [name], MD;  Location: [facility];  Service: Orthopedics;  Laterality: Left;  . TOE AMPUTATION Left 11/16/2015   left great toe   . TOE AMPUTATION Right 06/13/2016  . TONSILLECTOMY       Lysbeth Penner, FNP 09/19/17 1425    Lysbeth Penner, [place] 09/19/17 [phone]

## 2017-09-21 ENCOUNTER — Other Ambulatory Visit (INDEPENDENT_AMBULATORY_CARE_PROVIDER_SITE_OTHER): Payer: Self-pay | Admitting: Orthopedic Surgery

## 2017-12-10 ENCOUNTER — Other Ambulatory Visit: Payer: Self-pay | Admitting: Internal Medicine

## 2017-12-10 DIAGNOSIS — J42 Unspecified chronic bronchitis: Secondary | ICD-10-CM

## 2018-02-14 ENCOUNTER — Other Ambulatory Visit: Payer: Self-pay | Admitting: Internal Medicine

## 2018-02-14 DIAGNOSIS — J42 Unspecified chronic bronchitis: Secondary | ICD-10-CM

## 2018-02-17 ENCOUNTER — Telehealth: Payer: Self-pay | Admitting: Internal Medicine

## 2018-02-17 NOTE — Telephone Encounter (Signed)
Patient called asking for Fluticasone-Salmeterol (ADVAIR) 500-50 MCG/DOSE AEPB [[id number]]. Please let patient know if this cannot be filled

## 2018-02-19 ENCOUNTER — Encounter: Payer: Self-pay | Admitting: Nurse Practitioner

## 2018-02-19 ENCOUNTER — Ambulatory Visit: Payer: BLUE CROSS/BLUE SHIELD | Attending: Nurse Practitioner | Admitting: Nurse Practitioner

## 2018-02-19 VITALS — BP 165/98 | HR 93 | Temp 99.1°F | Ht 74.5 in | Wt 198.6 lb

## 2018-02-19 DIAGNOSIS — J42 Unspecified chronic bronchitis: Secondary | ICD-10-CM | POA: Diagnosis not present

## 2018-02-19 DIAGNOSIS — K861 Other chronic pancreatitis: Secondary | ICD-10-CM | POA: Diagnosis not present

## 2018-02-19 DIAGNOSIS — D709 Neutropenia, unspecified: Secondary | ICD-10-CM | POA: Insufficient documentation

## 2018-02-19 DIAGNOSIS — I10 Essential (primary) hypertension: Secondary | ICD-10-CM | POA: Diagnosis not present

## 2018-02-19 DIAGNOSIS — K649 Unspecified hemorrhoids: Secondary | ICD-10-CM | POA: Insufficient documentation

## 2018-02-19 DIAGNOSIS — Z91013 Allergy to seafood: Secondary | ICD-10-CM | POA: Diagnosis not present

## 2018-02-19 DIAGNOSIS — Z9889 Other specified postprocedural states: Secondary | ICD-10-CM | POA: Diagnosis not present

## 2018-02-19 DIAGNOSIS — Z79899 Other long term (current) drug therapy: Secondary | ICD-10-CM | POA: Insufficient documentation

## 2018-02-19 DIAGNOSIS — G8929 Other chronic pain: Secondary | ICD-10-CM | POA: Insufficient documentation

## 2018-02-19 DIAGNOSIS — Z881 Allergy status to other antibiotic agents status: Secondary | ICD-10-CM | POA: Diagnosis not present

## 2018-02-19 DIAGNOSIS — K219 Gastro-esophageal reflux disease without esophagitis: Secondary | ICD-10-CM | POA: Diagnosis not present

## 2018-02-19 DIAGNOSIS — M1A09X0 Idiopathic chronic gout, multiple sites, without tophus (tophi): Secondary | ICD-10-CM

## 2018-02-19 DIAGNOSIS — Z882 Allergy status to sulfonamides status: Secondary | ICD-10-CM | POA: Insufficient documentation

## 2018-02-19 DIAGNOSIS — Z89412 Acquired absence of left great toe: Secondary | ICD-10-CM | POA: Insufficient documentation

## 2018-02-19 DIAGNOSIS — Z760 Encounter for issue of repeat prescription: Secondary | ICD-10-CM | POA: Diagnosis not present

## 2018-02-19 DIAGNOSIS — M25572 Pain in left ankle and joints of left foot: Secondary | ICD-10-CM | POA: Insufficient documentation

## 2018-02-19 DIAGNOSIS — M1A9XX0 Chronic gout, unspecified, without tophus (tophi): Secondary | ICD-10-CM | POA: Insufficient documentation

## 2018-02-19 DIAGNOSIS — Z7951 Long term (current) use of inhaled steroids: Secondary | ICD-10-CM | POA: Insufficient documentation

## 2018-02-19 DIAGNOSIS — Z89421 Acquired absence of other right toe(s): Secondary | ICD-10-CM | POA: Diagnosis not present

## 2018-02-19 DIAGNOSIS — Z791 Long term (current) use of non-steroidal anti-inflammatories (NSAID): Secondary | ICD-10-CM | POA: Insufficient documentation

## 2018-02-19 DIAGNOSIS — Z89422 Acquired absence of other left toe(s): Secondary | ICD-10-CM | POA: Insufficient documentation

## 2018-02-19 MED ORDER — LIDOCAINE 5 % EX OINT: 1.0000 "application " | TOPICAL_OINTMENT | CUTANEOUS | 1 refills | Status: DC | PRN

## 2018-02-19 MED ORDER — HYDROCHLOROTHIAZIDE 25 MG PO TABS: 25.0000 mg | ORAL_TABLET | Freq: Every day | ORAL | 3 refills | Status: DC

## 2018-02-19 MED ORDER — FLUTICASONE PROPIONATE 50 MCG/ACT NA SUSP: 1.0000 | Freq: Every day | NASAL | 3 refills | Status: DC

## 2018-02-19 MED ORDER — FLUTICASONE-SALMETEROL 500-50 MCG/DOSE IN AEPB: 1.0000 | INHALATION_SPRAY | Freq: Two times a day (BID) | RESPIRATORY_TRACT | 3 refills | Status: DC

## 2018-02-19 MED ORDER — PANTOPRAZOLE SODIUM 40 MG PO TBEC: 40.0000 mg | DELAYED_RELEASE_TABLET | Freq: Every day | ORAL | 3 refills | Status: DC

## 2018-02-19 MED ORDER — ALLOPURINOL 300 MG PO TABS: 300.0000 mg | ORAL_TABLET | Freq: Every day | ORAL | 2 refills | Status: DC

## 2018-02-19 NOTE — Progress Notes (Signed)
Assessment & Plan:  Stephen Whitaker was seen today for establish care and medication refill.  Diagnoses and all orders for this visit:  Chronic bronchitis, unspecified chronic bronchitis type ([place]) -     Fluticasone-Salmeterol (ADVAIR) 500-50 MCG/DOSE AEPB; Inhale 1 puff into the lungs 2 (two) times daily. -     fluticasone (FLONASE) 50 MCG/ACT nasal spray; Place 1 spray into both nostrils daily.  Essential hypertension -     hydrochlorothiazide (HYDRODIURIL) 25 MG tablet; Take 1 tablet (25 mg total) by mouth daily. Continue all antihypertensives as prescribed.  Remember to bring in your blood pressure log with you for your follow up appointment.  DASH/Mediterranean Diets are healthier choices for HTN.    Gastroesophageal reflux disease without esophagitis -     pantoprazole (PROTONIX) 40 MG tablet; Take 1 tablet (40 mg total) by mouth daily. INSTRUCTIONS: Avoid GERD Triggers: acidic, spicy or fried foods, caffeine, coffee, sodas,  alcohol and chocolate.   Hemorrhoids, unspecified hemorrhoid type -     Ambulatory referral to Gastroenterology -     CBC  Chronic gout of multiple sites, unspecified cause -     allopurinol (ZYLOPRIM) 300 MG tablet; Take 1 tablet (300 mg total) by mouth daily. -     Uric Acid  Neutropenia, unspecified type ([place]) -     CBC  Acquired absence of other right toe(s) (HCC) Stable.   Other chronic pancreatitis (HCC) -    STABLE  Chronic pain of left ankle -     lidocaine (XYLOCAINE) 5 % ointment; Apply 1 application topically as needed.    Patient has been counseled on age-appropriate routine health concerns for screening and prevention. These are reviewed and up-to-date. Referrals have been placed accordingly. Immunizations are up-to-date or declined.    Subjective:   Chief Complaint  Patient presents with  . Establish Care    Pt. is here to establish for hypertension.   . Medication Refill   HPI Stephen Whitaker 62 y.o. male presents to  office today to establish care. He has a history of HTN, GERD, Chronic Pancreatitis (taking Creon) and COPD. Last colonoscopy 04-2017 (Next due 5 years). Requesting referral to Gastroenterology today due to re occurence of bleeding hemorrhoids.   Essential Hypertension Chronic. Blood pressure is poorly controlled. He endorses increased use of Ibuprofen 800mg  (2 tablets) daily for left ankle pain and swelling.  He checks his blood pressure at the [facility] with SBP 120s. I have instructed him to stop his excessive use of NSAIDs as this can contribute to his increased BP. He endorses medication compliance taking HCTZ 25mg , He does not add salt to his foods.  BP Readings from Last 3 Encounters:  02/19/18 (!) 165/98  09/18/17 (!) 153/95  05/22/17 134/82   Chronic Bronchitis Symptoms well controlled with Singulair 10mg  at bedtime, Advair 500-50 1 puff BID and sparing use of SABA. He denies any cough, SHOB or wheezing.   GERD Chronic. Symptoms controlled with protonix 40mg  daily.   Review of Systems  Constitutional: Negative for fever, malaise/fatigue and weight loss.  HENT: Negative.  Negative for nosebleeds.   Eyes: Negative.  Negative for blurred vision, double vision and photophobia.  Respiratory: Negative.  Negative for cough and shortness of breath.   Cardiovascular: Negative.  Negative for chest pain, palpitations and leg swelling.  Gastrointestinal: Positive for heartburn. Negative for nausea and vomiting.       History of bleeding hemorrhoids, notes re occurence  Musculoskeletal: Positive for joint pain (generalized  OA). Negative for myalgias.  Neurological: Negative.  Negative for dizziness, focal weakness, seizures and headaches.  Psychiatric/Behavioral: Negative.  Negative for suicidal ideas.    Past Medical History:  Diagnosis Date  . Arthritis   . Boil of buttock   . Chronic bronchitis ([place])   . COPD (chronic obstructive pulmonary disease) (HCC)    Chronic  Bronchitis.Advair inhaler   . Dyspnea   . GERD (gastroesophageal reflux disease)    takes Protonix daily  . Gout    takes Allopurinol daily  . Hemorrhoids   . Hypertension    takes HCTZ daily  . Pancreatitis    10 years ago  . Pneumonia 2016  . Tubular adenoma of colon     Past Surgical History:  Procedure Laterality Date  . AMPUTATION Left 07/14/2014   Procedure: 2nd Toe Amputation;  Surgeon: [name], MD;  Location: [facility];  Service: Orthopedics;  Laterality: Left;  . AMPUTATION Left 11/16/2015   Procedure: Left Great Toe Amputation at Metatarsophalangeal Joint;  Surgeon: [name], MD;  Location: [facility];  Service: Orthopedics;  Laterality: Left;  . AMPUTATION Right 06/13/2016   Procedure: RIGHT 2nd Toe Amputation;  Surgeon: [name], MD;  Location: [facility];  Service: Orthopedics;  Laterality: Right;  . AMPUTATION Left 10/19/2016   Procedure: Amputation Left 3rd Toe;  Surgeon: [name], MD;  Location: [facility];  Service: Orthopedics;  Laterality: Left;  . TOE AMPUTATION Left 11/16/2015   left great toe   . TOE AMPUTATION Right 06/13/2016  . TONSILLECTOMY      Family History  Adopted: Yes    Social History Reviewed with no changes to be made today.   Outpatient Medications Prior to Visit  Medication Sig Dispense Refill  . albuterol (PROVENTIL HFA;VENTOLIN HFA) 108 (90 Base) MCG/ACT inhaler Inhale 2 puffs into the lungs every 6 (six) hours as needed for wheezing or shortness of breath. 1 Inhaler 3  . COLCRYS 0.6 MG tablet TAKE ONE TABLET BY MOUTH ONCE DAILY TO TWICE DAILY AS NEEDED FOR  GOUT 60 tablet 12  . montelukast (SINGULAIR) 10 MG tablet Take 1 tablet (10 mg total) by mouth at bedtime. 30 tablet 3  . Multiple Vitamin (MULTIVITAMIN WITH MINERALS) TABS tablet Take 1 tablet by mouth daily. 90 tablet 3  . Pancrelipase, Lip-Prot-Amyl, (CREON) 24000-76000 units CPEP Take 1 capsule (24,000 Units total) by mouth 3 (three) times daily with meals. 180 capsule 3  .  triamcinolone ointment (KENALOG) 0.5 % APPLY 1 APPLICATION TOPICALLY TWICE DAILY 30 g 1  . allopurinol (ZYLOPRIM) 300 MG tablet Take 1 tablet (300 mg total) by mouth daily. 60 tablet 2  . fluticasone (FLONASE) 50 MCG/ACT nasal spray Place 1 spray into both nostrils daily. 16 g 3  . Fluticasone-Salmeterol (ADVAIR) 500-50 MCG/DOSE AEPB Inhale 1 puff into the lungs 2 (two) times daily. 60 each 3  . hydrochlorothiazide (HYDRODIURIL) 25 MG tablet Take 1 tablet (25 mg total) by mouth daily. 90 tablet 3  . ibuprofen (ADVIL,MOTRIN) 800 MG tablet TAKE 1 TABLET BY MOUTH EVERY 8 HOURS AS NEEDED FOR  MODERATE  PAIN 270 tablet 0  . pantoprazole (PROTONIX) 40 MG tablet Take 1 tablet (40 mg total) by mouth daily. 90 tablet 3  . Bioflavonoid Products (ESTER-C) TABS Take 1 tablet by mouth daily.    . ferrous sulfate 325 (65 FE) MG EC tablet Take 325 mg by mouth daily.    Marland Kitchen SPIRULINA PO Take 1 capsule by mouth  3 (three) times daily.    Marland Kitchen azithromycin (ZITHROMAX) 250 MG tablet Take 1 tablet (250 mg total) by mouth daily. Take first 2 tablets together, then 1 every day until finished. 6 tablet 0  . fluticasone (FLONASE) 50 MCG/ACT nasal spray USE 1 SPRAY(S) IN EACH NOSTRIL ONCE DAILY (Patient not taking: Reported on 02/19/2018) 16 g 0  . predniSONE (STERAPRED UNI-PAK 21 TAB) 10 MG (21) TBPK tablet Take 6-5-4-3-2-1 po qd (Patient not taking: Reported on 02/19/2018) 21 tablet 0   Facility-Administered Medications Prior to Visit  Medication Dose Route Frequency Provider Last Rate Last Dose  . 0.9 %  sodium chloride infusion  500 mL Intravenous Continuous Pyrtle, Lajuan Lines, MD        Allergies  Allergen Reactions  . Ciprofloxacin Rash and Other (See Comments)    SYNCOPE   . Shellfish Allergy Anaphylaxis  . Sulfa Antibiotics Other (See Comments)    SYNCOPE DIZZINESS        Objective:    BP (!) 165/98 (BP Location: Left Arm, Patient Position: Sitting, Cuff Size: Normal)   Pulse 93   Temp 99.1 F (37.3 C) (Oral)    Ht 6' 2.5" (1.892 m)   Wt 198 lb 9.6 oz (90.1 kg)   SpO2 98%   BMI 25.16 kg/m  Wt Readings from Last 3 Encounters:  02/19/18 198 lb 9.6 oz (90.1 kg)  05/22/17 200 lb (90.7 kg)  04/25/17 203 lb (92.1 kg)    Physical Exam  Constitutional: He is oriented to person, place, and time. He appears well-developed and well-nourished. He is cooperative.  HENT:  Head: Normocephalic and atraumatic.  Eyes: EOM are normal.  Neck: Normal range of motion.  Cardiovascular: Normal rate, regular rhythm, normal heart sounds and intact distal pulses. Exam reveals no gallop and no friction rub.  No murmur heard. Pulmonary/Chest: Effort normal and breath sounds normal. No tachypnea. No respiratory distress. He has no decreased breath sounds. He has no wheezes. He has no rhonchi. He has no rales. He exhibits no tenderness.  Abdominal: Bowel sounds are normal.  Musculoskeletal: Normal range of motion. He exhibits no edema.  Neurological: He is alert and oriented to person, place, and time. Coordination normal.  Skin: Skin is warm and dry.  Psychiatric: He has a normal mood and affect. His behavior is normal. Judgment and thought content normal.  Nursing note and vitals reviewed.     Patient has been counseled extensively about nutrition and exercise as well as the importance of adherence with medications and regular follow-up. The patient was given clear instructions to go to ER or return to medical center if symptoms don't improve, worsen or new problems develop. The patient verbalized understanding.   Follow-up: Return in about 3 weeks (around 03/12/2018) for BP recheck.   [name], FNP-BC [facility], [place]   02/23/2018, 2:42 PM

## 2018-02-20 ENCOUNTER — Other Ambulatory Visit (INDEPENDENT_AMBULATORY_CARE_PROVIDER_SITE_OTHER): Payer: Self-pay | Admitting: Family

## 2018-02-20 LAB — CBC
HEMOGLOBIN: 13.3 g/dL (ref 13.0–17.7)
Hematocrit: 41 % (ref 37.5–51.0)
MCH: 28.4 pg (ref 26.6–33.0)
MCHC: 32.4 g/dL (ref 31.5–35.7)
MCV: 87 fL (ref 79–97)
NRBC: 1 % — ABNORMAL HIGH (ref 0–0)
Platelets: 124 10*3/uL — ABNORMAL LOW (ref 150–450)
RBC: 4.69 x10E6/uL (ref 4.14–5.80)
RDW: 15.1 % (ref 12.3–15.4)
WBC: 2.7 10*3/uL — ABNORMAL LOW (ref 3.4–10.8)

## 2018-02-20 LAB — URIC ACID: URIC ACID: 5.4 mg/dL (ref 3.7–8.6)

## 2018-02-21 ENCOUNTER — Encounter: Payer: Self-pay | Admitting: *Deleted

## 2018-02-21 NOTE — Telephone Encounter (Signed)
Ok for refill? 

## 2018-02-23 ENCOUNTER — Encounter: Payer: Self-pay | Admitting: Nurse Practitioner

## 2018-02-24 ENCOUNTER — Ambulatory Visit: Payer: BLUE CROSS/BLUE SHIELD | Admitting: Internal Medicine

## 2018-02-25 ENCOUNTER — Telehealth: Payer: Self-pay | Admitting: Nurse Practitioner

## 2018-02-25 ENCOUNTER — Telehealth: Payer: Self-pay

## 2018-02-25 ENCOUNTER — Other Ambulatory Visit: Payer: Self-pay | Admitting: Nurse Practitioner

## 2018-02-25 ENCOUNTER — Encounter: Payer: Self-pay | Admitting: Nurse Practitioner

## 2018-02-25 MED ORDER — BUDESONIDE-FORMOTEROL FUMARATE 160-4.5 MCG/ACT IN AERO: 2.0000 | INHALATION_SPRAY | Freq: Two times a day (BID) | RESPIRATORY_TRACT | 3 refills | Status: DC

## 2018-02-25 NOTE — Telephone Encounter (Signed)
Done. Thanks.

## 2018-02-25 NOTE — Telephone Encounter (Signed)
Can you please run this by the new PCP? Thanks

## 2018-02-25 NOTE — Telephone Encounter (Signed)
Call [facility] about the advair 500/50

## 2018-02-25 NOTE — Telephone Encounter (Signed)
We got advair approved but the copay was over  $200, if we switch to symbicort, which I also got approved the pt can use a copay card and get it for free until the end of the year then renew as needed. Can we switch him and send it to [facility] please?

## 2018-02-25 NOTE — Telephone Encounter (Signed)
New hematology referral received from Malachi Pro, NP for dx of neutropenia. Pt has been scheduled tfor the pt to see Cira Rue, NP on 7/22 at 130pm. Pt aware to arrive 30 minutes early. Letter mailed to the pt.

## 2018-02-25 NOTE — Telephone Encounter (Signed)
Attempted claim in RX[employer], same issue as [facility], when I complete the PA it tells me Symbicort and Ruthe Mannan are also preferred. Would one of them be ok for him?

## 2018-02-25 NOTE — Telephone Encounter (Signed)
Spoke w/ pharmacist, when they try generic it says use brand, when the use brand it wants a PA and when I do the PA it says the generic is approved.... Will call the ins company

## 2018-02-28 ENCOUNTER — Encounter: Payer: Self-pay | Admitting: Nurse Practitioner

## 2018-03-09 NOTE — Progress Notes (Addendum)
[place]  [phone] Fax:([phone]) [place] consult Note   Patient Care Team: Tresa Garter, MD as PCP - General (Internal Medicine) [name], MD as Consulting Physician (Orthopedic Surgery) 03/10/2018  CHIEF COMPLAINTS/PURPOSE OF CONSULTATION:  Neutropenia   REFERRED BY: Archie Patten, NP   HISTORY OF PRESENTING ILLNESS:  Stephen Whitaker 62 y.o. male is here because of neutropenia. He was initially seen at [facility] in 07/2010 by Dr. Alen Blew also for neutropenia. He was found to have abnormal CBC from 02/15/10 with WBC 2.1, neutrophils 0.9. In 03/24/2010 WBC was 2/7, neutrophils 1.0, neutrophil percentage 38.5%, and Hgb 12.9.  On 07/14/2014 WBC was 2.3, Hgb 11.7; was noted to have mild AST elevation.  On 01/21/2015 he was admitted to the hospital for acute pancreatitis, WBC 0.8, ANC 0, Hgb 9.3.  He relates he has had chronic pancreatitis since then managed with Creon.  HIV and FOBT on admission were negative. Does not appear he has been tested for Hep B or C. Iron studies on 01/21/2015 indicate low serum iron 13, 7% saturation, TIBC low at 181, and ferritin evaded to 577. He was on oral iron therapy remotely but not recently. B12 was also elevated to 915 at that time. WBC was normal in 09/2016 and 10/2016, then dropped again to 2.9 on 01/23/17. He was ultimately referred back to [place] after labs on 02/19/18 indicated WBC 2.7 and platelet count 124K, Hgb currently normal.   He denies recent chest pain on exertion, shortness of breath on minimal exertion, pre-syncopal episodes, or palpitations.He had not noticed any recent bleeding such as epistaxis, hematuria or hematochezia. He previously used NSAIDs for many years, 800 mg BID counter NSAID for left ankle pain. He is not on antiplatelets agents. His last colonoscopy was 04/25/17 per Dr. Zenovia Jarred with tubular adenoma at hepatic flexure, otherwise negative for malignancy. He had no prior history or diagnosis of  cancer. He eats a variety of diet, has no known nutritional deficiencies.  Past medical history is significant for chronic bronchitis, chronic pancreatitis without recent infection/flares, gout, and hemorrhoids. He has 3 month history of food getting stuck with occasional emesis, on PPI now much improved. He has chronic nail hyperpigmentation. S/p 3 toe amputations on left foot and 1 on right dating back 5 years ago, related to working long hours in steel-toe boots and nonhealing skin wounds. He was told he had poor circulation in his feet.   Patient was adopted, he does not know his biological parents' medical history. He had a biological sister who diet of cancer that spread from her "liver to her brain" at age 74's and a brother who died of injuries sustained in the [employer]. He has 1 son, healthy and well. 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 He denies tobacco or drug use, drinks moderate alcohol rarely.   MEDICAL HISTORY:  Past Medical History:  Diagnosis Date  . Arthritis   . Boil of buttock   . Chronic bronchitis ([place])   . COPD (chronic obstructive pulmonary disease) ([facility])    Chronic Bronchitis.Advair inhaler   . Dyspnea   . GERD (gastroesophageal reflux disease)    takes Protonix daily  . Gout    takes Allopurinol daily  . Hemorrhoids   . Hypertension    takes HCTZ daily  . Pancreatitis    10 years ago  . Pneumonia 2016  . Tubular adenoma of colon     SURGICAL HISTORY: Past Surgical  History:  Procedure Laterality Date  . AMPUTATION Left 07/14/2014   Procedure: 2nd Toe Amputation;  Surgeon: [name], MD;  Location: [facility];  Service: Orthopedics;  Laterality: Left;  . AMPUTATION Left 11/16/2015   Procedure: Left Great Toe Amputation at Metatarsophalangeal Joint;  Surgeon: [name], MD;  Location: [facility];  Service: Orthopedics;  Laterality: Left;  . AMPUTATION Right 06/13/2016   Procedure: RIGHT 2nd Toe Amputation;  Surgeon: [name], MD;   Location: [place];  Service: Orthopedics;  Laterality: Right;  . AMPUTATION Left 10/19/2016   Procedure: Amputation Left 3rd Toe;  Surgeon: [name], MD;  Location: [place];  Service: Orthopedics;  Laterality: Left;  . TOE AMPUTATION Left 11/16/2015   left great toe   . TOE AMPUTATION Right 06/13/2016  . TONSILLECTOMY      SOCIAL HISTORY: Social History   Socioeconomic History  . Marital status: Single    Spouse name: Not on file  . Number of children: 1  . Years of education:    . Highest education level: Not on file  Occupational History  . Not on file  Social Needs  . Financial resource strain: Not on file  . Food insecurity:    Worry: Not on file    Inability: Not on file  . Transportation needs:    Medical: Not on file    Non-medical: Not on file  Tobacco Use  . Smoking status: Never Smoker  . Smokeless tobacco: Never Used  Substance and Sexual Activity  . Alcohol use: Yes    Alcohol/week: 0.0 oz    Comment: rarely  . Drug use: No  . Sexual activity: Not Currently  Lifestyle  . Physical activity:    Days per week: Not on file    Minutes per session: Not on file  . Stress: Not on file  Relationships  . Social connections:    Talks on phone: Not on file    Gets together: Not on file    Attends religious service: Not on file    Active member of club or organization: Not on file    Attends meetings of clubs or organizations: Not on file    Relationship status: Not on file  . Intimate partner violence:    Fear of current or ex partner: Not on file    Emotionally abused: Not on file    Physically abused: Not on file    Forced sexual activity: Not on file  Other Topics Concern  . Not on file  Social History Narrative  . Not on file    FAMILY HISTORY: Family History  Adopted: Yes  Problem Relation Age of Onset  . Cancer Sister 18       unknown origin     ALLERGIES:  is allergic to ciprofloxacin; shellfish allergy; and sulfa  antibiotics.  MEDICATIONS:  Current Outpatient Medications  Medication Sig Dispense Refill  . albuterol (PROVENTIL HFA;VENTOLIN HFA) 108 (90 Base) MCG/ACT inhaler Inhale 2 puffs into the lungs every 6 (six) hours as needed for wheezing or shortness of breath. 1 Inhaler 3  . allopurinol (ZYLOPRIM) 300 MG tablet Take 1 tablet (300 mg total) by mouth daily. 60 tablet 2  . Bioflavonoid Products (ESTER-C) TABS Take 1 tablet by mouth daily.    . budesonide-formoterol (SYMBICORT) 160-4.5 MCG/ACT inhaler Inhale 2 puffs into the lungs 2 (two) times daily. 3 Inhaler 3  . COLCRYS 0.6 MG tablet TAKE ONE TABLET BY MOUTH ONCE DAILY TO TWICE  DAILY AS NEEDED FOR  GOUT 60 tablet 12  . fluticasone (FLONASE) 50 MCG/ACT nasal spray Place 1 spray into both nostrils daily. 16 g 3  . hydrochlorothiazide (HYDRODIURIL) 25 MG tablet Take 1 tablet (25 mg total) by mouth daily. 90 tablet 3  . ibuprofen (ADVIL,MOTRIN) 800 MG tablet TAKE 1 TABLET BY MOUTH EVERY 8 HOURS AS NEEDED FOR  MODERATE  PAIN 270 tablet 0  . lidocaine (XYLOCAINE) 5 % ointment Apply 1 application topically as needed. 50 g 1  . Multiple Vitamin (MULTIVITAMIN WITH MINERALS) TABS tablet Take 1 tablet by mouth daily. 90 tablet 3  . Pancrelipase, Lip-Prot-Amyl, (CREON) 24000-76000 units CPEP Take 1 capsule (24,000 Units total) by mouth 3 (three) times daily with meals. 180 capsule 3  . pantoprazole (PROTONIX) 40 MG tablet Take 1 tablet (40 mg total) by mouth daily. 90 tablet 3  . SPIRULINA PO Take 1 capsule by mouth 3 (three) times daily.    . ferrous sulfate 325 (65 FE) MG EC tablet Take 325 mg by mouth daily.    . montelukast (SINGULAIR) 10 MG tablet Take 1 tablet (10 mg total) by mouth at bedtime. (Patient not taking: Reported on 03/10/2018) 30 tablet 3  . triamcinolone ointment (KENALOG) 0.5 % APPLY 1 APPLICATION TOPICALLY TWICE DAILY (Patient not taking: Reported on 03/10/2018) 30 g 1   Current Facility-Administered Medications  Medication Dose Route  Frequency Provider Last Rate Last Dose  . 0.9 %  sodium chloride infusion  500 mL Intravenous Continuous Pyrtle, Lajuan Lines, MD        REVIEW OF SYSTEMS:   Constitutional: Denies fevers, chills abnormal night sweats, or unintentional weight loss Ears, nose, mouth, throat, and face: Denies mucositis or sore throat Respiratory: Denies wheezes (+) chronic cough secondary to chronic bronchitis (+) occasional dyspnea, our of inhaler recently  Cardiovascular: Denies palpitation, chest discomfort or lower extremity swelling Gastrointestinal:  Denies nausea, vomiting, constipation, hematochezia, heartburn or change in bowel habits (+) periodic diarrhea (+) lactose intolerant (+) 3 month h/o foot getting stuck with periodic emesis, symptoms improved on pantoprazole  Skin: Denies abnormal skin rashes or facial rash (+) intermittent skin darkening and burning when hot (+) hands are intolerant to cold temperatures  Lymphatics: Denies new lymphadenopathy or easy bruising Neurological:Denies numbness, tingling or new weaknesses Behavioral/Psych: Mood is stable, no new changes  MSK: (+) left ankle deformity (+) s/p toe amputations, 3 on L and 1 on R  All other systems were reviewed with the patient and are negative.  PHYSICAL EXAMINATION: ECOG PERFORMANCE STATUS: 0 - Asymptomatic  Vitals:   03/10/18 1327  BP: 127/87  Pulse: 99  Resp: 18  Temp: 98.8 F (37.1 C)  SpO2: 98%   Filed Weights   03/10/18 1327  Weight: 196 lb (88.9 kg)    GENERAL:alert, no distress and comfortable SKIN:  no rashes or significant lesions EYES: normal, conjunctiva are pink and non-injected, sclera clear LYMPH:  no palpable cervical, supraclavicular, or axillary lymphadenopathy LUNGS: clear to auscultation with normal breathing effort HEART: regular rate & rhythm and no murmurs and no lower extremity edema ABDOMEN:abdomen soft, non-tender and normal bowel sounds. No hepatosplenomegaly  Musculoskeletal:no cyanosis of digits  and no clubbing. Left ankle swelling/deformity  PSYCH: alert & oriented x 3 with fluent speech NEURO: no focal motor/sensory deficits  LABORATORY DATA:  I have reviewed the data as listed CBC Latest Ref Rng & Units 03/10/2018 02/19/2018 01/23/2017  WBC 4.0 - 10.3 K/uL 2.4(L) 2.7(L) 2.9(L)  Hemoglobin 13.0 -  17.1 g/dL 12.3(L) 13.3 12.4(L)  Hematocrit 38.4 - 49.9 % 38.4 41.0 39.6  Platelets 140 - 400 K/uL 205 124(L) 247    CMP Latest Ref Rng & Units 03/10/2018 01/23/2017 10/19/2016  Glucose 70 - 99 mg/dL 97 84 80  BUN 8 - 23 mg/dL _0 Creatinine 0.61 - 1.24 mg/dL 0.92 0.93 0.79  Sodium 135 - 145 mmol/L 142 144 139  Potassium 3.5 - 5.1 mmol/L 3.1(L) 3.7 3.2(L)  Chloride 98 - 111 mmol/L 104 103 104  CO2 22 - 32 mmol/L _1 Calcium 8.9 - 10.3 mg/dL 9.1 8.9 9.1  Total Protein 6.5 - 8.1 g/dL 7.5 7.0 -  Total Bilirubin 0.3 - 1.2 mg/dL 0.3 0.2 -  Alkaline Phos 38 - 126 U/L 95 100 -  AST 15 - 41 U/L 66(H) 76(H) -  ALT 0 - 44 U/L 54(H) 57(H) -     RADIOGRAPHIC STUDIES: I have personally reviewed the radiological images as listed and agreed with the findings in the report. No results found.  ASSESSMENT & PLAN: 62 yo AAM with h/o chronic bronchitis, pancreatitis, osteomyelitis requiring multiple toe amputation, GERD, HTN, and chronic neutropenia   1. Chronic mild neutropenia with intermittent anemia and thrombocytopenia  -we reviewed his medical record in detail with the patient. He has chronic fluctuating mild neutropenia dating back to 2011. Dr. Burr Medico reviewed potential causes including nutritional deficiencies such as B12 deficiency, liver disease such as viral hepatitis, autoimmune, vs bone marrow condition.  -Given his overall mild and chronic neutropenia with mostly normal Hgb and platelet count Dr. Burr Medico has high suspicion this is benign. She does not recommend further imaging or bone marrow biopsy at this time.  -Will repeat hep B, C, B12, and MMA to rule out nutritional cause and  liver disease.  -He denies heavy alcohol use, which can contribute to neutropenia -She reviewed function of WBC and his increased susceptibility to infection. Dr. Burr Medico recommends flu and pneumonia vaccines and to avoid risk factors for infection, such as smoking and second hand smoke. -Given the benign etiology, plan to repeat labs today and again in 6 months with f/u.   2. S/p multiple toe amputation, nail darkening -He has required multiple toe amputation for nonhealing toe infections, he attributes to poor peripheral circulation.  -He has never had ABI to evaluate PVD, which can be done per PCP -He has chronic nail hyperpigmentation and hypersensitivity to cold temperatures, not typical presentation of raynaud's phenomenon, but possible.  -Dr. Burr Medico discussed possible autoimmune association between raynaud's signs and neutropenia, which would again suggest benign etiology of neutropenia  3. HTN, GERD -Continue f/u with PCP  -He has sensation of food getting stuck requiring episodic emesis, I reviewed this may require upper GI work up in the future. Symptoms are currently managed with PPI, which he will continue. F/u with GI PRN.   PLAN: -Repeat labs today, will follow up with results  -Lab, f/u in 6 months    All questions were answered. The patient knows to call the clinic with any problems, questions or concerns. I spent 45 minutes counseling the patient face to face. The total time spent in the appointment was 60 minutes and more than 50% was on counseling.     Alla Feeling, NP 03/10/18   Addendum  I have seen the patient, examined him. I agree with the assessment and and plan and have edited the notes.   I have reviewed Leyda's previous lab results  and image findings.  He has long-standing history of mild intermittent neutropenia, a few episodes of mild thrombocytopenia without bleeding.  He has history of Raynaud's phenomena, multiple toe amputation secondary to long hour  standing in steel-toe boots, no other evidence of autoimmune disease or frequent infections.  HIV was tested which was negative.  He denies any liver disease, has small to moderate alcohol consumption, and his CT scan from June 2016 showed normal-appearing liver and spleen.  We discussed the possible etiology of his mild neutropenia, such as benign ethnic neutropenia (being AA), B12 deficiency, medication, alcohol or chronic viral infection related,  autoimmune related, etc.  I do not see any obvious medication which can potentially cause neutropenia.  His previous B12 level was high.  I will check B12 and methylmalonic acid level to rule out B12 deficiency, HBV and HCV to rule out chronic infection related neutropenia.  My suspicion for primary pulmonary disease, such as MDS is very low given the mild intermittent neutropenia in the past 7-8 years without any worsening neutropenia or other cytopenia. I will hold off bone marrow biopsy for now. I recommend him to f/u with [place] with repeated CBC and diff. I will see him back in 6 months.  He voiced good understanding and agrees with the plan.  Truitt Merle  03/10/2018

## 2018-03-10 ENCOUNTER — Inpatient Hospital Stay: Payer: BLUE CROSS/BLUE SHIELD | Attending: Nurse Practitioner | Admitting: Nurse Practitioner

## 2018-03-10 ENCOUNTER — Telehealth: Payer: Self-pay | Admitting: Nurse Practitioner

## 2018-03-10 ENCOUNTER — Telehealth: Payer: Self-pay | Admitting: Hematology

## 2018-03-10 ENCOUNTER — Inpatient Hospital Stay: Payer: BLUE CROSS/BLUE SHIELD

## 2018-03-10 ENCOUNTER — Encounter: Payer: Self-pay | Admitting: Nurse Practitioner

## 2018-03-10 VITALS — BP 127/87 | HR 99 | Temp 98.8°F | Resp 18 | Ht 74.5 in | Wt 196.0 lb

## 2018-03-10 DIAGNOSIS — D709 Neutropenia, unspecified: Secondary | ICD-10-CM | POA: Insufficient documentation

## 2018-03-10 DIAGNOSIS — J449 Chronic obstructive pulmonary disease, unspecified: Secondary | ICD-10-CM

## 2018-03-10 DIAGNOSIS — D649 Anemia, unspecified: Secondary | ICD-10-CM

## 2018-03-10 DIAGNOSIS — D708 Other neutropenia: Secondary | ICD-10-CM

## 2018-03-10 DIAGNOSIS — K861 Other chronic pancreatitis: Secondary | ICD-10-CM | POA: Insufficient documentation

## 2018-03-10 DIAGNOSIS — D696 Thrombocytopenia, unspecified: Secondary | ICD-10-CM | POA: Diagnosis not present

## 2018-03-10 DIAGNOSIS — K769 Liver disease, unspecified: Secondary | ICD-10-CM | POA: Diagnosis not present

## 2018-03-10 DIAGNOSIS — I10 Essential (primary) hypertension: Secondary | ICD-10-CM

## 2018-03-10 DIAGNOSIS — Z79899 Other long term (current) drug therapy: Secondary | ICD-10-CM | POA: Diagnosis not present

## 2018-03-10 DIAGNOSIS — J42 Unspecified chronic bronchitis: Secondary | ICD-10-CM

## 2018-03-10 DIAGNOSIS — E538 Deficiency of other specified B group vitamins: Secondary | ICD-10-CM | POA: Diagnosis not present

## 2018-03-10 LAB — CBC WITH DIFFERENTIAL (CANCER CENTER ONLY)
BASOS ABS: 0 10*3/uL (ref 0.0–0.1)
BASOS PCT: 1 %
EOS ABS: 0.1 10*3/uL (ref 0.0–0.5)
Eosinophils Relative: 2 %
HCT: 38.4 % (ref 38.4–49.9)
HEMOGLOBIN: 12.3 g/dL — AB (ref 13.0–17.1)
Lymphocytes Relative: 20 %
Lymphs Abs: 0.5 10*3/uL — ABNORMAL LOW (ref 0.9–3.3)
MCH: 28.1 pg (ref 27.2–33.4)
MCHC: 32.1 g/dL (ref 32.0–36.0)
MCV: 87.4 fL (ref 79.3–98.0)
MONOS PCT: 13 %
Monocytes Absolute: 0.3 10*3/uL (ref 0.1–0.9)
NEUTROS ABS: 1.6 10*3/uL (ref 1.5–6.5)
NEUTROS PCT: 64 %
PLATELETS: 205 10*3/uL (ref 140–400)
RBC: 4.4 MIL/uL (ref 4.20–5.82)
RDW: 14.3 % (ref 11.0–14.6)
WBC: 2.4 10*3/uL — AB (ref 4.0–10.3)

## 2018-03-10 LAB — CMP (CANCER CENTER ONLY)
ALT: 54 U/L — AB (ref 0–44)
AST: 66 U/L — AB (ref 15–41)
Albumin: 3.7 g/dL (ref 3.5–5.0)
Alkaline Phosphatase: 95 U/L (ref 38–126)
Anion gap: 8 (ref 5–15)
BUN: 15 mg/dL (ref 8–23)
CHLORIDE: 104 mmol/L (ref 98–111)
CO2: 30 mmol/L (ref 22–32)
CREATININE: 0.92 mg/dL (ref 0.61–1.24)
Calcium: 9.1 mg/dL (ref 8.9–10.3)
GFR, Estimated: >60 mL/min (ref >60)
GLUCOSE: 97 mg/dL (ref 70–99)
Potassium: 3.1 mmol/L — ABNORMAL LOW (ref 3.5–5.1)
SODIUM: 142 mmol/L (ref 135–145)
Total Bilirubin: 0.3 mg/dL (ref 0.3–1.2)
Total Protein: 7.5 g/dL (ref 6.5–8.1)

## 2018-03-10 LAB — VITAMIN B12: Vitamin B-12: 464 pg/mL (ref 180–914)

## 2018-03-10 NOTE — Telephone Encounter (Signed)
Scheduled appt per 7/22 los - gave patient aVS and calender per los.  

## 2018-03-10 NOTE — Telephone Encounter (Signed)
Printed records for [employer] from [place]

## 2018-03-11 LAB — HEPATITIS B SURFACE ANTIGEN: Hepatitis B Surface Ag: NEGATIVE

## 2018-03-11 LAB — HEPATITIS C ANTIBODY: HCV Ab: <0.1 s/co ratio (ref 0.0–0.9)

## 2018-03-12 ENCOUNTER — Ambulatory Visit: Payer: BLUE CROSS/BLUE SHIELD | Attending: Family Medicine | Admitting: Pharmacist

## 2018-03-12 ENCOUNTER — Encounter: Payer: Self-pay | Admitting: Pharmacist

## 2018-03-12 VITALS — BP 138/82 | HR 86

## 2018-03-12 DIAGNOSIS — I10 Essential (primary) hypertension: Secondary | ICD-10-CM | POA: Diagnosis not present

## 2018-03-12 LAB — METHYLMALONIC ACID, SERUM: METHYLMALONIC ACID, QUANTITATIVE: 132 nmol/L (ref 0–378)

## 2018-03-12 NOTE — Patient Instructions (Signed)
Thank you for coming to see me today.   Remember to take your HCTZ 25 mg every day. Try to limit ibuprofen use as this can increase your blood pressure.  Try to limit salt and caffeine and increase the amount of fresh fruit/vegetables that you eat every day.

## 2018-03-12 NOTE — Progress Notes (Signed)
   S:    Patient arrives in good spirits.    Presents to the clinic for hypertension evaluation, counseling, and management. Patient was referred by [facility] on 02/19/18.  Patient was last seen by Primary Care Provider on that day.  Patient reports adherence with medications.  Current BP Medications include:  HCTZ 25 mg daily  Dietary habits include:  -pt reports high intake of sodium, drinks 1-2 cups of black coffee a day, drinks at least 2 cups of soda a day Exercise habits include:  - pt reports getting plenty of physical activity. He states that he walks and rides his bike. Family / Social history:  - SH: no pertinent positives - Tobacco: never user  Pt does not take BP at home.   O:  138/82 - BP taken after 5 minutes of rest  Last 3 Office BP readings: BP Readings from Last 3 Encounters:  03/12/18 138/82  03/10/18 127/87  02/19/18 (!) 165/98   BMET    Component Value Date/Time   NA 142 03/10/2018 1503   NA 144 01/23/2017 1636   K 3.1 (L) 03/10/2018 1503   CL 104 03/10/2018 1503   CO2 30 03/10/2018 1503   GLUCOSE 97 03/10/2018 1503   BUN 15 03/10/2018 1503   BUN 14 01/23/2017 1636   CREATININE 0.92 03/10/2018 1503   CREATININE 1.30 (H) 09/20/2016 1440   CALCIUM 9.1 03/10/2018 1503   GFRNONAA >60 03/10/2018 1503   GFRNONAA 84 08/10/2014 1023   GFRAA >60 03/10/2018 1503   GFRAA >89 08/10/2014 1023    Renal function: Estimated Creatinine Clearance: 96.9 mL/min (by C-G formula based on SCr of 0.92 mg/dL).  A/P: Hypertension longstanding diagnosed currently uncontrolled on current medications. BP Goal <130/80 mmHg. Patient is adherent with current medications. Pt not at goal but improved since last PCP encounter. He had an encounter at [facility] and his BP was 127/87. He is not diet compliant; he does not limit sodium or caffeine. I have emphasized the importance of limiting these. Will continue current regimen for now and have patient follow-up with PCP.     -Continued HCTZ 25 mg.  -F/u labs ordered - none; recent BMET collected 03/10/18 -Counseled on lifestyle modifications for blood pressure control including reduced dietary sodium, increased exercise, adequate sleep  Results reviewed and written information provided.   Total time in face-to-face counseling 15 minutes.   F/U Clinic Visit 03/19/18.  Benard Halsted, PharmD, [place] [phone]

## 2018-03-13 ENCOUNTER — Encounter: Payer: Self-pay | Admitting: Pharmacist

## 2018-03-19 ENCOUNTER — Ambulatory Visit: Payer: BLUE CROSS/BLUE SHIELD

## 2018-03-21 ENCOUNTER — Encounter: Payer: Self-pay | Admitting: Nurse Practitioner

## 2018-03-21 ENCOUNTER — Ambulatory Visit: Payer: BLUE CROSS/BLUE SHIELD | Attending: Nurse Practitioner | Admitting: Nurse Practitioner

## 2018-03-21 VITALS — BP 137/90 | HR 91 | Temp 98.1°F | Ht 74.5 in | Wt 196.4 lb

## 2018-03-21 DIAGNOSIS — Z79899 Other long term (current) drug therapy: Secondary | ICD-10-CM | POA: Insufficient documentation

## 2018-03-21 DIAGNOSIS — R0600 Dyspnea, unspecified: Secondary | ICD-10-CM | POA: Diagnosis not present

## 2018-03-21 DIAGNOSIS — J449 Chronic obstructive pulmonary disease, unspecified: Secondary | ICD-10-CM | POA: Diagnosis not present

## 2018-03-21 DIAGNOSIS — R05 Cough: Secondary | ICD-10-CM | POA: Diagnosis not present

## 2018-03-21 DIAGNOSIS — Z882 Allergy status to sulfonamides status: Secondary | ICD-10-CM | POA: Diagnosis not present

## 2018-03-21 DIAGNOSIS — K219 Gastro-esophageal reflux disease without esophagitis: Secondary | ICD-10-CM | POA: Insufficient documentation

## 2018-03-21 DIAGNOSIS — Z89422 Acquired absence of other left toe(s): Secondary | ICD-10-CM | POA: Insufficient documentation

## 2018-03-21 DIAGNOSIS — Z89421 Acquired absence of other right toe(s): Secondary | ICD-10-CM | POA: Insufficient documentation

## 2018-03-21 DIAGNOSIS — N529 Male erectile dysfunction, unspecified: Secondary | ICD-10-CM | POA: Diagnosis not present

## 2018-03-21 DIAGNOSIS — Z881 Allergy status to other antibiotic agents status: Secondary | ICD-10-CM | POA: Diagnosis not present

## 2018-03-21 DIAGNOSIS — R0989 Other specified symptoms and signs involving the circulatory and respiratory systems: Secondary | ICD-10-CM | POA: Diagnosis not present

## 2018-03-21 DIAGNOSIS — Z91013 Allergy to seafood: Secondary | ICD-10-CM | POA: Diagnosis not present

## 2018-03-21 DIAGNOSIS — I10 Essential (primary) hypertension: Secondary | ICD-10-CM | POA: Insufficient documentation

## 2018-03-21 DIAGNOSIS — J42 Unspecified chronic bronchitis: Secondary | ICD-10-CM | POA: Diagnosis not present

## 2018-03-21 MED ORDER — SILDENAFIL CITRATE 50 MG PO TABS: 50.0000 mg | ORAL_TABLET | Freq: Every day | ORAL | 1 refills | Status: DC | PRN

## 2018-03-21 MED ORDER — MONTELUKAST SODIUM 10 MG PO TABS: 10.0000 mg | ORAL_TABLET | Freq: Every day | ORAL | 3 refills | Status: DC

## 2018-03-21 MED ORDER — IPRATROPIUM-ALBUTEROL 0.5-2.5 (3) MG/3ML IN SOLN
3.0000 mL | Freq: Once | RESPIRATORY_TRACT | Status: AC
  Administered 2018-03-21: 3 mL via RESPIRATORY_TRACT

## 2018-03-21 NOTE — Progress Notes (Signed)
duone  Assessment & Plan:  Paola was seen today for blood pressure check.  Diagnoses and all orders for this visit:  Chronic bronchitis, unspecified chronic bronchitis type ([place]) -     Ambulatory referral to Pulmonology -     ipratropium-albuterol (DUONEB) 0.5-2.5 (3) MG/3ML nebulizer solution 3 mL -     montelukast (SINGULAIR) 10 MG tablet; Take 1 tablet (10 mg total) by mouth at bedtime.  Erectile dysfunction, unspecified erectile dysfunction type -     sildenafil (VIAGRA) 50 MG tablet; Take 1 tablet (50 mg total) by mouth daily as needed for erectile dysfunction.    Patient has been counseled on age-appropriate routine health concerns for screening and prevention. These are reviewed and up-to-date. Referrals have been placed accordingly. Immunizations are up-to-date or declined.    Subjective:   Chief Complaint  Patient presents with  . Blood Pressure Check    Pt. stated he feel his chest congestion. Patient is also here for blood pressure check.    HPI Airen W Wich 62 y.o. male presents to office today for follow up BP and chest congestion. He complains of continued chest congestion with cough and wheezing despite endorsing compliance with use of symbicort 160-4.5 2 puffs BID and prn use of albuterol.   Essential Hypertension Chronic and well controlled today. Endorses medication compliance taking HCTZ 25mg  as prescribed. Denies chest pain, shortness of breath, palpitations, lightheadedness, dizziness, headaches or BLE edema.  BP Readings from Last 3 Encounters:  03/21/18 137/90  03/12/18 138/82  03/10/18 127/87    Dyspnea Patient complains of shortness of breath at rest.  Symptoms include dry cough, dyspnea on exertion and wheezing. Symptoms began several weeks ago, gradually worsening since that time.  Patient denies frequent throat clearing, post nasal drip and unresolving pneumonia. Patient has not had recent travel.  Weight has been stable.  Appetite has been  unaffected. Symptoms are exacerbated by any exercise and heat. Symptoms are alleviated by medication(s) (albuterol and steroids). He has not been taking his singular as prescribed. States he purchased OTC loratadine instead. I have instructed him that these 2 medications work differently and he should be taking his singulair. Apparently there were some financial issues regarding being able to pick his singulair up.    Review of Systems  Constitutional: Negative for fever, malaise/fatigue and weight loss.  HENT: Negative.  Negative for nosebleeds.   Eyes: Negative.  Negative for blurred vision, double vision and photophobia.  Respiratory: Positive for cough, shortness of breath and wheezing.   Cardiovascular: Negative.  Negative for chest pain, palpitations and leg swelling.  Gastrointestinal: Negative.  Negative for heartburn, nausea and vomiting.  Musculoskeletal: Negative.  Negative for myalgias.  Neurological: Negative.  Negative for dizziness, focal weakness, seizures and headaches.  Psychiatric/Behavioral: Negative.  Negative for suicidal ideas.    Past Medical History:  Diagnosis Date  . Arthritis   . Boil of buttock   . Chronic bronchitis ([place])   . COPD (chronic obstructive pulmonary disease) ([facility])    Chronic Bronchitis.Advair inhaler   . Dyspnea   . GERD (gastroesophageal reflux disease)    takes Protonix daily  . Gout    takes Allopurinol daily  . Hemorrhoids   . Hypertension    takes HCTZ daily  . Pancreatitis    10 years ago  . Pneumonia 2016  . Tubular adenoma of colon     Past Surgical History:  Procedure Laterality Date  . AMPUTATION Left 07/14/2014   Procedure: 2nd Toe Amputation;  Surgeon: [name], MD;  Location: [facility];  Service: Orthopedics;  Laterality: Left;  . AMPUTATION Left 11/16/2015   Procedure: Left Great Toe Amputation at Metatarsophalangeal Joint;  Surgeon: [name], MD;  Location: [facility];  Service: Orthopedics;  Laterality: Left;  .  AMPUTATION Right 06/13/2016   Procedure: RIGHT 2nd Toe Amputation;  Surgeon: [name], MD;  Location: [facility];  Service: Orthopedics;  Laterality: Right;  . AMPUTATION Left 10/19/2016   Procedure: Amputation Left 3rd Toe;  Surgeon: [name], MD;  Location: [facility];  Service: Orthopedics;  Laterality: Left;  . TOE AMPUTATION Left 11/16/2015   left great toe   . TOE AMPUTATION Right 06/13/2016  . TONSILLECTOMY      Family History  Adopted: Yes  Problem Relation Age of Onset  . Cancer Sister 26       unknown origin     Social History Reviewed with no changes to be made today.   Outpatient Medications Prior to Visit  Medication Sig Dispense Refill  . albuterol (PROVENTIL HFA;VENTOLIN HFA) 108 (90 Base) MCG/ACT inhaler Inhale 2 puffs into the lungs every 6 (six) hours as needed for wheezing or shortness of breath. 1 Inhaler 3  . allopurinol (ZYLOPRIM) 300 MG tablet Take 1 tablet (300 mg total) by mouth daily. 60 tablet 2  . Bioflavonoid Products (ESTER-C) TABS Take 1 tablet by mouth daily.    . budesonide-formoterol (SYMBICORT) 160-4.5 MCG/ACT inhaler Inhale 2 puffs into the lungs 2 (two) times daily. 3 Inhaler 3  . COLCRYS 0.6 MG tablet TAKE ONE TABLET BY MOUTH ONCE DAILY TO TWICE DAILY AS NEEDED FOR  GOUT 60 tablet 12  . fluticasone (FLONASE) 50 MCG/ACT nasal spray Place 1 spray into both nostrils daily. 16 g 3  . hydrochlorothiazide (HYDRODIURIL) 25 MG tablet Take 1 tablet (25 mg total) by mouth daily. 90 tablet 3  . ibuprofen (ADVIL,MOTRIN) 800 MG tablet TAKE 1 TABLET BY MOUTH EVERY 8 HOURS AS NEEDED FOR  MODERATE  PAIN 270 tablet 0  . lidocaine (XYLOCAINE) 5 % ointment Apply 1 application topically as needed. 50 g 1  . Multiple Vitamin (MULTIVITAMIN WITH MINERALS) TABS tablet Take 1 tablet by mouth daily. 90 tablet 3  . Pancrelipase, Lip-Prot-Amyl, (CREON) 24000-76000 units CPEP Take 1 capsule (24,000 Units total) by mouth 3 (three) times daily with meals. 180 capsule 3  .  pantoprazole (PROTONIX) 40 MG tablet Take 1 tablet (40 mg total) by mouth daily. 90 tablet 3  . SPIRULINA PO Take 1 capsule by mouth 3 (three) times daily.    . ferrous sulfate 325 (65 FE) MG EC tablet Take 325 mg by mouth daily.    Marland Kitchen triamcinolone ointment (KENALOG) 0.5 % APPLY 1 APPLICATION TOPICALLY TWICE DAILY (Patient not taking: Reported on 03/10/2018) 30 g 1  . montelukast (SINGULAIR) 10 MG tablet Take 1 tablet (10 mg total) by mouth at bedtime. (Patient not taking: Reported on 03/10/2018) 30 tablet 3   Facility-Administered Medications Prior to Visit  Medication Dose Route Frequency Provider Last Rate Last Dose  . 0.9 %  sodium chloride infusion  500 mL Intravenous Continuous Pyrtle, Lajuan Lines, MD        Allergies  Allergen Reactions  . Ciprofloxacin Rash and Other (See Comments)    SYNCOPE   . Shellfish Allergy Anaphylaxis  . Sulfa Antibiotics Other (See Comments)    SYNCOPE DIZZINESS        Objective:    BP 137/90 (BP Location:  Left Arm, Patient Position: Sitting, Cuff Size: Normal)   Pulse 91   Temp 98.1 F (36.7 C) (Oral)   Ht 6' 2.5" (1.892 m)   Wt 196 lb 6.4 oz (89.1 kg)   SpO2 98%   BMI 24.88 kg/m  Wt Readings from Last 3 Encounters:  03/21/18 196 lb 6.4 oz (89.1 kg)  03/10/18 196 lb (88.9 kg)  02/19/18 198 lb 9.6 oz (90.1 kg)    Physical Exam  Constitutional: He is oriented to person, place, and time. He appears well-developed and well-nourished. He is cooperative.  HENT:  Head: Normocephalic and atraumatic.  Cardiovascular: Normal rate, regular rhythm and normal heart sounds. Exam reveals no gallop and no friction rub.  No murmur heard. Pulmonary/Chest: Effort normal. No tachypnea. No respiratory distress. He has no decreased breath sounds. He has wheezes in the right middle field, the right lower field, the left middle field and the left lower field. He has rhonchi (throughout). He has no rales. He exhibits no tenderness.  Abdominal: Bowel sounds are normal.    Musculoskeletal: Normal range of motion. He exhibits no edema.  Neurological: He is alert and oriented to person, place, and time. Coordination normal.  Skin: Skin is warm and dry.  Psychiatric: He has a normal mood and affect. His behavior is normal. Judgment and thought content normal.  Nursing note and vitals reviewed.        Patient has been counseled extensively about nutrition and exercise as well as the importance of adherence with medications and regular follow-up. The patient was given clear instructions to go to ER or return to medical center if symptoms don't improve, worsen or new problems develop. The patient verbalized understanding.   Follow-up: Return in about 3 months (around 06/21/2018) for please schedule with our PULMONOLOGIST.  Also see me in 3 months.   [name], FNP-BC [facility] [place], [place]   03/21/2018, 12:11 PM

## 2018-03-21 NOTE — Progress Notes (Signed)
.  hem

## 2018-03-25 ENCOUNTER — Telehealth: Payer: Self-pay

## 2018-03-25 NOTE — Telephone Encounter (Signed)
Spoke with patient concerning lab results as indicated by Cira Rue NP, he will f/u with his primary care physician for repeat labs.  Instructed on eating potassium rich foods.  Patient verbalized an understanding.

## 2018-03-25 NOTE — Telephone Encounter (Signed)
-----   Message from Alla Feeling, NP sent at 03/25/2018  8:36 AM EDT ----- Please let his know B12, MMA (nutrition markers) were normal. He does not have evidence of Hepatitis B or C, so there is no viral cause for his neutropenia. His labs are mostly stable. His potassium is low, probably due to his BP med HCTZ. Please have him increase K in his diet and repeat this at his PCP's office. If he prefers to repeat it here that is fine too, please send a schedule message for lab only in 2 weeks. Please continue to f/u with PCP routinely. We will see him back in January as planned.  Thanks, Regan Rakers NP

## 2018-05-21 ENCOUNTER — Institutional Professional Consult (permissible substitution): Payer: BLUE CROSS/BLUE SHIELD | Admitting: Internal Medicine

## 2018-05-26 ENCOUNTER — Ambulatory Visit (INDEPENDENT_AMBULATORY_CARE_PROVIDER_SITE_OTHER): Payer: BLUE CROSS/BLUE SHIELD | Admitting: Internal Medicine

## 2018-05-26 ENCOUNTER — Encounter: Payer: Self-pay | Admitting: Internal Medicine

## 2018-05-26 ENCOUNTER — Other Ambulatory Visit (INDEPENDENT_AMBULATORY_CARE_PROVIDER_SITE_OTHER): Payer: BLUE CROSS/BLUE SHIELD

## 2018-05-26 VITALS — BP 140/82 | HR 96 | Ht 72.75 in | Wt 196.6 lb

## 2018-05-26 DIAGNOSIS — J4530 Mild persistent asthma, uncomplicated: Secondary | ICD-10-CM

## 2018-05-26 DIAGNOSIS — K219 Gastro-esophageal reflux disease without esophagitis: Secondary | ICD-10-CM

## 2018-05-26 LAB — CBC WITH DIFFERENTIAL/PLATELET
BASOS ABS: 0 10*3/uL (ref 0.0–0.1)
Basophils Relative: 0.6 % (ref 0.0–3.0)
EOS ABS: 0.1 10*3/uL (ref 0.0–0.7)
Eosinophils Relative: 3.1 % (ref 0.0–5.0)
HCT: 37.6 % — ABNORMAL LOW (ref 39.0–52.0)
Hemoglobin: 12.1 g/dL — ABNORMAL LOW (ref 13.0–17.0)
Lymphocytes Relative: 21.4 % (ref 12.0–46.0)
Lymphs Abs: 0.9 10*3/uL (ref 0.7–4.0)
MCHC: 32.3 g/dL (ref 30.0–36.0)
MCV: 87.6 fl (ref 78.0–100.0)
Monocytes Absolute: 0.5 10*3/uL (ref 0.1–1.0)
Monocytes Relative: 11.8 % (ref 3.0–12.0)
NEUTROS PCT: 63.1 % (ref 43.0–77.0)
Neutro Abs: 2.6 10*3/uL (ref 1.4–7.7)
PLATELETS: 206 10*3/uL (ref 150.0–400.0)
RBC: 4.29 Mil/uL (ref 4.22–5.81)
RDW: 15 % (ref 11.5–15.5)
WBC: 4.1 10*3/uL (ref 4.0–10.5)

## 2018-05-26 MED ORDER — BUDESONIDE-FORMOTEROL FUMARATE 160-4.5 MCG/ACT IN AERO: 2.0000 | INHALATION_SPRAY | Freq: Two times a day (BID) | RESPIRATORY_TRACT | 0 refills | Status: DC

## 2018-05-26 MED ORDER — PANTOPRAZOLE SODIUM 40 MG PO TBEC: DELAYED_RELEASE_TABLET | ORAL | 3 refills | Status: DC

## 2018-05-26 NOTE — Patient Instructions (Addendum)
Plan A = Automatic = Continue Symbicort 160 Take 2 puffs first thing in am and then another 2 puffs about 12 hours later.   Work on inhaler technique:  relax and gently blow all the way out then take a nice smooth deep breath back in, triggering the inhaler at same time you start breathing in.  Hold for up to 5 seconds if you can. Blow out thru nose. Rinse and gargle with water when done  Plan B = Backup Only use your albuterol (VENTOLIN)  as a rescue medication to be used if you can't catch your breath by resting or doing a relaxed purse lip breathing pattern.  - The less you use it, the better it will work when you need it. - Ok to use the inhaler up to 2 puffs  every 4 hours if you must but call for appointment if use goes up over your usual need - Don't leave home without it !!  (think of it like the spare tire for your car)    Increase Protonix to Take 30- 60 min before your first and last meals of the day   GERD (REFLUX)  is an extremely common cause of respiratory symptoms just like yours , many times with no obvious heartburn at all.    It can be treated with medication, but also with lifestyle changes including elevation of the head of your bed (ideally with 6 inch  bed blocks),  Smoking cessation, avoidance of late meals, excessive alcohol, and avoid fatty foods, chocolate, peppermint, colas, red wine, and acidic juices such as orange juice.  NO MINT OR MENTHOL PRODUCTS SO NO COUGH DROPS  USE SUGARLESS CANDY INSTEAD (Jolley ranchers or Stover's or Life Savers) or even ice chips will also do - the key is to swallow to prevent all throat clearing. NO OIL BASED VITAMINS - use powdered substitutes.     Please remember to go to the lab department downstairs in the basement  for your tests - we will call you with the results when they are available.      Please schedule a follow up office visit in 4 weeks, sooner if needed  with all medications /inhalers/ solutions in hand so we can  verify exactly what you are taking. This includes all medications from all doctors and over the counters - add needs cxr on return

## 2018-05-26 NOTE — Assessment & Plan Note (Signed)
rec ppi bid ac and referred to GI 05/26/2018    May well be contributing to resp symptoms so took the liberty of referring to our GI dept as he denies ever seeing GI previously      Total time devoted to counseling  > 50 % of initial 60 min office visit:  review case with pt/ discussion of options/alternatives/ personally creating written customized instructions  in presence of pt  then going over those specific  Instructions directly with the pt including how to use all of the meds but in particular covering each new medication in detail and the difference between the maintenance= "automatic" meds and the prns using an action plan format for the latter (If this problem/symptom => do that organization reading Left to right).  Please see AVS from this visit for a full list of these instructions which I personally wrote for this pt and  are unique to this visit.   See device teaching which extended face to face time for this visit

## 2018-05-26 NOTE — Progress Notes (Addendum)
Stephen Whitaker, male    DOB: 12-18-1955,     MRN: [id number]   Brief patient profile:  62  yobm never smoker dx as childhood asthma/ rhinitis with chronic symptoms preventing nl activities much better p age 62 then moved to college at [employer] age 62 more symptoms on some form of daily rx much better on advair and daily albterol  but still bothered by year round rhinitis and then when advair ran out breathing worse then changed to  symbicort and better on symbicort but still using saba bid and referred to pulmonary clinic 05/26/2018 by [name]   History of Present Illness  05/26/2018  Pulmonary/ Initial office eval/ Johathon Overturf  Chief Complaint  Patient presents with  . Pulmonary Consult    Referred by [name], NP for eval of bronchitis. Pt states he has had issues with bronchitis all of his life. He states he is not currently bothered by a cough or any trouble with breathing.   Dyspnea:  MMRC1 = can walk nl pace, flat grade, can't hurry or go uphills or steps s sob   Cough: none at present though some sense of pnds  Sleep: on side flat bed/ 1-2 pillows SABA use: ventolin avg   twice daily on or off advair or symbicort    No obvious day to day or daytime variability or assoc excess/ purulent sputum or mucus plugs or hemoptysis or cp or chest tightness, subjective wheeze or overt   hb symptoms though on ppi x years at bfast, has to chew up food real well or can come back up  Sleeping  without nocturnal  or early am exacerbation  of respiratory  c/o's or need for noct saba. Also denies any obvious fluctuation of symptoms with weather or environmental changes or other aggravating or alleviating factors except as outlined above   No unusual exposure hx or h/o childhood pna/ asthma or knowledge of premature birth.  Current Allergies, Complete Past Medical History, Past Surgical History, Family History, and Social History were reviewed in [employer] record.  ROS  The  following are not active complaints unless bolded Hoarseness, sore throat, dysphagia, dental problems, itching, sneezing,  nasal congestion or discharge of excess mucus or purulent secretions, ear ache,   fever, chills, sweats, unintended wt loss or wt gain, classically pleuritic or exertional cp,  orthopnea pnd or arm/hand swelling  or leg swelling, presyncope, palpitations, abdominal pain, anorexia, nausea, vomiting, diarrhea  or change in bowel habits or change in bladder habits, change in stools or change in urine, dysuria, hematuria,  rash, arthralgias, visual complaints, headache, numbness, weakness or ataxia or problems with walking or coordination,  change in mood or  memory.              Past Medical History:  Diagnosis Date  . Arthritis   . Boil of buttock   . Chronic bronchitis ([place])   . COPD (chronic obstructive pulmonary disease) ([facility])    Chronic Bronchitis.Advair inhaler   . Dyspnea   . GERD (gastroesophageal reflux disease)    takes Protonix daily  . Gout    takes Allopurinol daily  . Hemorrhoids   . Hypertension    takes HCTZ daily  . Pancreatitis    10 years ago  . Pneumonia 2016  . Tubular adenoma of colon     Outpatient Medications Prior to Visit  Medication Sig Dispense Refill  . albuterol (PROVENTIL HFA;VENTOLIN HFA) 108 (90 Base) MCG/ACT inhaler Inhale  2 puffs into the lungs every 6 (six) hours as needed for wheezing or shortness of breath. 1 Inhaler 3  . allopurinol (ZYLOPRIM) 300 MG tablet Take 1 tablet (300 mg total) by mouth daily. 60 tablet 2  . Bioflavonoid Products (ESTER-C) TABS Take 1 tablet by mouth daily.    . budesonide-formoterol (SYMBICORT) 160-4.5 MCG/ACT inhaler Inhale 2 puffs into the lungs 2 (two) times daily. 3 Inhaler 3  . COLCRYS 0.6 MG tablet TAKE ONE TABLET BY MOUTH ONCE DAILY TO TWICE DAILY AS NEEDED FOR  GOUT 60 tablet 12  . fluticasone (FLONASE) 50 MCG/ACT nasal spray Place 1 spray into both nostrils daily. 16 g 3  .  hydrochlorothiazide (HYDRODIURIL) 25 MG tablet Take 1 tablet (25 mg total) by mouth daily. 90 tablet 3  . ibuprofen (ADVIL,MOTRIN) 800 MG tablet TAKE 1 TABLET BY MOUTH EVERY 8 HOURS AS NEEDED FOR  MODERATE  PAIN 270 tablet 0  . lidocaine (XYLOCAINE) 5 % ointment Apply 1 application topically as needed. 50 g 1  . Multiple Vitamin (MULTIVITAMIN WITH MINERALS) TABS tablet Take 1 tablet by mouth daily. 90 tablet 3  . Pancrelipase, Lip-Prot-Amyl, (CREON) 24000-76000 units CPEP Take 1 capsule (24,000 Units total) by mouth 3 (three) times daily with meals. 180 capsule 3  . pantoprazole (PROTONIX) 40 MG tablet Take 1 tablet (40 mg total) by mouth daily. 90 tablet 3  . sildenafil (VIAGRA) 50 MG tablet Take 1 tablet (50 mg total) by mouth daily as needed for erectile dysfunction. 5 tablet 1  . SPIRULINA PO Take 1 capsule by mouth 3 (three) times daily.    Marland Kitchen triamcinolone ointment (KENALOG) 0.5 % APPLY 1 APPLICATION TOPICALLY TWICE DAILY 30 g 1  . ferrous sulfate 325 (65 FE) MG EC tablet Take 325 mg by mouth daily.    . montelukast (SINGULAIR) 10 MG tablet Take 1 tablet (10 mg total) by mouth at bedtime. 30 tablet 3             Objective:     BP 140/82 (BP Location: Left Arm, Cuff Size: Normal)   Pulse 96   Ht 6' 0.75" (1.848 m)   Wt 196 lb 9.6 oz (89.2 kg)   SpO2 98%   BMI 26.12 kg/m   SpO2: 98 % RA   Somber stoic bm nad   HEENT: nl dentition, and oropharynx. Nl external ear canals without cough reflex - moderate bilateral non-specific turbinate edema     NECK :  without JVD/Nodes/TM/ nl carotid upstrokes bilaterally   LUNGS: no acc muscle use,  Nl contour chest which is clear to A and P bilaterally without cough on insp or exp maneuvers   CV:  RRR  no s3 or murmur or increase in P2, and no edema   ABD:  soft and nontender with nl inspiratory excursion in the supine position. No bruits or organomegaly appreciated, bowel sounds nl  MS:  Nl gait/ ext warm without deformities, calf  tenderness, cyanosis or clubbing No obvious joint restrictions   SKIN: warm and dry without lesions    NEURO:  alert, approp, nl sensorium with  no motor or cerebellar deficits apparent.     Labs ordered 05/26/2018  Allergy profile       Assessment   Chronic asthma, mild persistent, uncomplicated Allergy profile 05/26/2018 >  Eos 0. /  IgE pending  - Spirometry 05/26/2018  FEV1 2.5 (75%)  Ratio 77 min curvature p am symb 160 x2 puffs  - 05/26/2018  After  extensive coaching inhaler device,  effectiveness =    75% from baseline of 25%  So continue symbicort 160 / leave off singulair as says can't afford it    DDX of  difficult airways management almost all start with A and  include Adherence, Ace Inhibitors, Acid Reflux, Active Sinus Disease, Alpha 1 Antitripsin deficiency, Anxiety masquerading as Airways dz,  ABPA,  Allergy(esp in young), Aspiration (esp in elderly), Adverse effects of meds,  Active smokers, A bunch of PE's (a small clot burden can't cause this syndrome unless there is already severe underlying pulm or vascular dz with poor reserve) plus two Bs  = Bronchiectasis and Beta blocker use..and one C= CHF   Adherence is always the initial "prime suspect" and is a multilayered concern that requires a "trust but verify" approach in every patient - starting with knowing how to use medications, especially inhalers, correctly, keeping up with refills and understanding the fundamental difference between maintenance and prns vs those medications only taken for a very short course and then stopped and not refilled.  - see hfa teaching  - return with all meds in hand using a trust but verify approach to confirm accurate Medication  Reconciliation The principal here is that until we are certain that the  patients are doing what we've asked, it makes no sense to ask them to do more.   ? Acid (or non-acid) GERD > always difficult to exclude as up to 75% of pts in some series report no assoc GI/  Heartburn symptoms and he has overt dysphagia too > rec max (24h)  acid suppression and diet restrictions/ reviewed and instructions given in writing. See gerd a/p   ? Allergy > check profile/ hold singulair for now   ? Active sinus dz/ rhinitis > blow symbicort out thru nose   ? Aspiration > no def hx but at risk due to dysphagia   ? Adverse effects of DPI > leave off advair and all dpi permanently given upper airway symptoms which overlap with dpi effects      Gastroesophageal reflux disease without esophagitis rec ppi bid ac and referred to GI 05/26/2018    May well be contributing to resp symptoms so took the liberty of referring to our GI dept as he denies ever seeing GI previously      Total time devoted to counseling  > 50 % of initial 60 min office visit:  review case with pt/ discussion of options/alternatives/ personally creating written customized instructions  in presence of pt  then going over those specific  Instructions directly with the pt including how to use all of the meds but in particular covering each new medication in detail and the difference between the maintenance= "automatic" meds and the prns using an action plan format for the latter (If this problem/symptom => do that organization reading Left to right).  Please see AVS from this visit for a full list of these instructions which I personally wrote for this pt and  are unique to this visit.   See device teaching which extended face to face time for this visit      Christinia Gully, MD 05/26/2018

## 2018-05-26 NOTE — Assessment & Plan Note (Addendum)
Allergy profile 05/26/2018 >  Eos 0. /  IgE pending  - Spirometry 05/26/2018  FEV1 2.5 (75%)  Ratio 77 min curvature p am symb 160 x2 puffs  - 05/26/2018  After extensive coaching inhaler device,  effectiveness =    75% from baseline of 25%  So continue symbicort 160 / leave off singulair as says can't afford it    DDX of  difficult airways management almost all start with A and  include Adherence, Ace Inhibitors, Acid Reflux, Active Sinus Disease, Alpha 1 Antitripsin deficiency, Anxiety masquerading as Airways dz,  ABPA,  Allergy(esp in young), Aspiration (esp in elderly), Adverse effects of meds,  Active smokers, A bunch of PE's (a small clot burden can't cause this syndrome unless there is already severe underlying pulm or vascular dz with poor reserve) plus two Bs  = Bronchiectasis and Beta blocker use..and one C= CHF   Adherence is always the initial "prime suspect" and is a multilayered concern that requires a "trust but verify" approach in every patient - starting with knowing how to use medications, especially inhalers, correctly, keeping up with refills and understanding the fundamental difference between maintenance and prns vs those medications only taken for a very short course and then stopped and not refilled.  - see hfa teaching  - return with all meds in hand using a trust but verify approach to confirm accurate Medication  Reconciliation The principal here is that until we are certain that the  patients are doing what we've asked, it makes no sense to ask them to do more.   ? Acid (or non-acid) GERD > always difficult to exclude as up to 75% of pts in some series report no assoc GI/ Heartburn symptoms and he has overt dysphagia too > rec max (24h)  acid suppression and diet restrictions/ reviewed and instructions given in writing. See gerd a/p   ? Allergy > check profile/ hold singulair for now   ? Active sinus dz/ rhinitis > blow symbicort out thru nose   ? Aspiration > no def hx but  at risk due to dysphagia   ? Adverse effects of DPI > leave off advair and all dpi permanently given upper airway symptoms which overlap with dpi effects

## 2018-05-27 ENCOUNTER — Telehealth: Payer: Self-pay | Admitting: Internal Medicine

## 2018-05-27 ENCOUNTER — Other Ambulatory Visit: Payer: Self-pay | Admitting: Internal Medicine

## 2018-05-27 ENCOUNTER — Encounter: Payer: Self-pay | Admitting: Internal Medicine

## 2018-05-27 DIAGNOSIS — K219 Gastro-esophageal reflux disease without esophagitis: Secondary | ICD-10-CM

## 2018-05-27 LAB — RESPIRATORY ALLERGY PROFILE REGION II ~~LOC~~
ALLERGEN, CEDAR TREE, T6: 0.57 kU/L — AB
ALLERGEN, COMM SILVER BIRCH, T3: 0.41 kU/L — AB
Allergen, A. alternata, m6: 0.1 kU/L — ABNORMAL HIGH
Allergen, D pternoyssinus,d7: 5.3 kU/L — ABNORMAL HIGH
Allergen, Mouse Urine Protein, e78: <0.1 kU/L
Allergen, Oak,t7: 0.12 kU/L — ABNORMAL HIGH
Allergen, P. notatum, m1: 0.23 kU/L — ABNORMAL HIGH
Aspergillus fumigatus, m3: 0.84 kU/L — ABNORMAL HIGH
BERMUDA GRASS: 3.81 kU/L — AB
Box Elder IgE: 0.37 kU/L — ABNORMAL HIGH
CAT DANDER: 9.63 kU/L — AB
CLADOSPORIUM HERBARUM (M2) IGE: 0.24 kU/L — AB
CLASS: 0
CLASS: 0
CLASS: 0
CLASS: 1
CLASS: 2
CLASS: 2
CLASS: 2
CLASS: 3
CLASS: 3
CLASS: 3
COMMON RAGWEED (SHORT) (W1) IGE: 3.33 kU/L — AB
Class: 0
Class: 0
Class: 0
Class: 0
Class: 0
Class: 0
Class: 0
Class: 1
Class: 1
Class: 2
Class: 3
Class: 3
Class: 3
Class: 3
Cockroach: 10.1 kU/L — ABNORMAL HIGH
D. farinae: 10.1 kU/L — ABNORMAL HIGH
DOG DANDER: 1.09 kU/L — AB
Elm IgE: 0.13 kU/L — ABNORMAL HIGH
IgE (Immunoglobulin E), Serum: 905 kU/L — ABNORMAL HIGH (ref <=114)
Johnson Grass: 4.33 kU/L — ABNORMAL HIGH
PECAN/HICKORY TREE IGE: 0.71 kU/L — AB
SHEEP SORREL IGE: 0.14 kU/L — AB
Timothy Grass: 7.97 kU/L — ABNORMAL HIGH

## 2018-05-27 LAB — INTERPRETATION:

## 2018-05-27 MED ORDER — PANTOPRAZOLE SODIUM 40 MG PO TBEC: DELAYED_RELEASE_TABLET | ORAL | 2 refills | Status: DC

## 2018-05-27 NOTE — Telephone Encounter (Signed)
ERROR

## 2018-05-27 NOTE — Telephone Encounter (Signed)
Called and spoke to patient, patient states Dr. Melvyn Novas told him whenever he needed refills he could call and get a coupon for his symbicort. Informed patient that we don't have coupons that we give to patients every month for refills however we do have a coupon this time and we would place a patient assistance application upfront with the coupon for him to fill out and bring back. Voiced understanding. Nothing further is needed at this time.

## 2018-05-27 NOTE — Progress Notes (Signed)
Spoke with pt and notified of results per Dr. Wert. Pt verbalized understanding and denied any questions. 

## 2018-06-06 ENCOUNTER — Telehealth: Payer: Self-pay | Admitting: Internal Medicine

## 2018-06-09 NOTE — Telephone Encounter (Signed)
I have retrieved the forms from Dr. Gustavus Bryant folder. Spoke with Corrine who is working with MW today letting her know that once the paperwork was filled out by MW that pt wanted a copy of the paperwork mailed to him.  Since Corrine was the one I spoke with since she is working with MW today, routing message to [place] for her to follow up on.

## 2018-06-09 NOTE — Telephone Encounter (Signed)
I have mailed the copy of paperwork to the patient and mailed the paperwork into [employer] since the fax would not go through. Patient is aware. Nothing further needed.

## 2018-06-23 ENCOUNTER — Ambulatory Visit: Payer: BLUE CROSS/BLUE SHIELD | Attending: Nurse Practitioner | Admitting: Nurse Practitioner

## 2018-06-23 ENCOUNTER — Encounter: Payer: Self-pay | Admitting: Internal Medicine

## 2018-06-23 ENCOUNTER — Ambulatory Visit (INDEPENDENT_AMBULATORY_CARE_PROVIDER_SITE_OTHER): Payer: BLUE CROSS/BLUE SHIELD | Admitting: Internal Medicine

## 2018-06-23 ENCOUNTER — Encounter: Payer: Self-pay | Admitting: Nurse Practitioner

## 2018-06-23 VITALS — BP 150/90 | HR 90 | Ht 74.5 in | Wt 200.8 lb

## 2018-06-23 VITALS — BP 144/89 | HR 94 | Temp 98.6°F | Ht 74.5 in | Wt 209.0 lb

## 2018-06-23 DIAGNOSIS — M109 Gout, unspecified: Secondary | ICD-10-CM | POA: Diagnosis not present

## 2018-06-23 DIAGNOSIS — J449 Chronic obstructive pulmonary disease, unspecified: Secondary | ICD-10-CM | POA: Diagnosis not present

## 2018-06-23 DIAGNOSIS — Z881 Allergy status to other antibiotic agents status: Secondary | ICD-10-CM | POA: Insufficient documentation

## 2018-06-23 DIAGNOSIS — I10 Essential (primary) hypertension: Secondary | ICD-10-CM | POA: Diagnosis not present

## 2018-06-23 DIAGNOSIS — Z882 Allergy status to sulfonamides status: Secondary | ICD-10-CM | POA: Insufficient documentation

## 2018-06-23 DIAGNOSIS — J42 Unspecified chronic bronchitis: Secondary | ICD-10-CM | POA: Diagnosis not present

## 2018-06-23 DIAGNOSIS — Z79899 Other long term (current) drug therapy: Secondary | ICD-10-CM | POA: Diagnosis not present

## 2018-06-23 DIAGNOSIS — K219 Gastro-esophageal reflux disease without esophagitis: Secondary | ICD-10-CM | POA: Diagnosis not present

## 2018-06-23 DIAGNOSIS — J4530 Mild persistent asthma, uncomplicated: Secondary | ICD-10-CM

## 2018-06-23 DIAGNOSIS — Z23 Encounter for immunization: Secondary | ICD-10-CM

## 2018-06-23 MED ORDER — TETANUS-DIPHTH-ACELL PERTUSSIS 5-2.5-18.5 LF-MCG/0.5 IM SUSP: 0.5000 mL | Freq: Once | INTRAMUSCULAR | 0 refills | Status: AC

## 2018-06-23 MED ORDER — ALBUTEROL SULFATE 108 (90 BASE) MCG/ACT IN AEPB: 2.0000 | INHALATION_SPRAY | Freq: Four times a day (QID) | RESPIRATORY_TRACT | 1 refills | Status: DC | PRN

## 2018-06-23 MED ORDER — BUDESONIDE-FORMOTEROL FUMARATE 160-4.5 MCG/ACT IN AERO: 2.0000 | INHALATION_SPRAY | Freq: Two times a day (BID) | RESPIRATORY_TRACT | 0 refills | Status: DC

## 2018-06-23 MED FILL — BOOSTRIX VACCINE SYRINGE: 5-2.5-18.5 | 1 days supply | Qty: 1 | Fill #0

## 2018-06-23 NOTE — Patient Instructions (Addendum)
Plan A = Automatic = Symbicort 160 Take 2 puffs first thing in am and then another 2 puffs about 12 hours later.     Plan B = Backup Only use your albuterol as a rescue medication to be used if you can't catch your breath by resting or doing a relaxed purse lip breathing pattern.  - The less you use it, the better it will work when you need it. - Ok to use the inhaler up to 2 puffs  every 4 hours if you must but call for appointment if use goes up over your usual need - Don't leave home without it !!  (think of it like the spare tire for your car)    If you cannot access the symbicort then return here in 2 weeks with your drug  formulary in hand (this the information)   to see me or one of my nurse practioners    If happy we can see you in 3 months  Late add:  Change proair respiclick to hfa (inadvertantly called in the dpi form which is likely to irrritate his throat)

## 2018-06-23 NOTE — Addendum Note (Signed)
Addended byMariane Baumgarten on: 06/23/2018 03:22 PM   Modules accepted: Orders

## 2018-06-23 NOTE — Progress Notes (Signed)
Stephen Whitaker, male    DOB: May 24, 1956,     MRN: [id number]     Brief patient profile:  15  yobm never smoker dx as childhood asthma/ rhinitis with chronic symptoms preventing nl activities much better p age 62 then moved to college at [employer] age 69 more symptoms on some form of daily rx much better on advair and daily albterol  but still bothered by year round rhinitis and then when advair ran out breathing worse then changed to  symbicort and better on symbicort but still using saba bid and referred to pulmonary clinic 05/26/2018 by [name]    History of Present Illness  05/26/2018  Pulmonary / new pt eval / Stephen Whitaker  Chief Complaint  Patient presents with  . Pulmonary Consult    Referred by [name], NP for eval of bronchitis. Pt states he has had issues with bronchitis all of his life. He states he is not currently bothered by a cough or any trouble with breathing.   Dyspnea:  MMRC1 = can walk nl pace, flat grade, can't hurry or go uphills or steps s sob   Cough: none at present though some sense of pnds  Sleep: on side flat bed/ 1-2 pillows SABA use: ventolin avg   twice daily on or off advair or symbicort  rec Plan A = Automatic = Continue Symbicort 160 Take 2 puffs first thing in am and then another 2 puffs about 12 hours later.  Work on inhaler technique:    Plan B = Backup Only use your albuterol (VENTOLIN)   Increase Protonix to Take 30- 60 min before your first and last meals of the day  GERD diet           06/23/2018  f/u ov/[name] re: asthma/ rhinitis with marked atopy / difficulty affording meds despite insurance / did not bring in meds or formulary  Chief Complaint  Patient presents with  . Follow-up    Breathing is doing better. He is using his rescue inhaler 1-2 x per wk on average.   Dyspnea:  Not limited by breathing from desired activities   Cough: none but some sense of pnds day > noct with globus sensation  Sleeping: flat one pillow SABA use: as  above - much less since started symb 160 vs advair prior  02: none    No  obvious day to day or daytime variability or assoc excess/ purulent sputum or mucus plugs or hemoptysis or cp or chest tightness, subjective wheeze or overt sinus or hb symptoms.    Sleeping as above without nocturnal  or early am exacerbation  of respiratory  c/o's or need for noct saba. Also denies any obvious fluctuation of symptoms with weather or environmental changes or other aggravating or alleviating factors except as outlined above   No unusual exposure hx or h/o childhood pna/ asthma or knowledge of premature birth.  Current Allergies, Complete Past Medical History, Past Surgical History, Family History, and Social History were reviewed in [employer] record.  ROS  The following are not active complaints unless bolded Hoarseness, sore throat, dysphagia(c/w globus) , dental problems, itching, sneezing,  nasal congestion or discharge of excess mucus or purulent secretions, ear ache,   fever, chills, sweats, unintended wt loss or wt gain, classically pleuritic or exertional cp,  orthopnea pnd or arm/hand swelling  or leg swelling, presyncope, palpitations, abdominal pain, anorexia, nausea, vomiting, diarrhea  or change in bowel habits or change in  bladder habits, change in stools or change in urine, dysuria, hematuria,  rash, arthralgias, visual complaints, headache, numbness, weakness or ataxia or problems with walking or coordination,  change in mood or  memory.        Current Meds  Medication Sig  . albuterol (PROVENTIL HFA;VENTOLIN HFA) 108 (90 Base) MCG/ACT inhaler Inhale 2 puffs into the lungs every 6 (six) hours as needed for wheezing or shortness of breath.  . allopurinol (ZYLOPRIM) 300 MG tablet Take 1 tablet (300 mg total) by mouth daily.  Marland Kitchen Bioflavonoid Products (ESTER-C) TABS Take 1 tablet by mouth daily.  . budesonide-formoterol (SYMBICORT) 160-4.5 MCG/ACT inhaler Inhale 2 puffs  into the lungs 2 (two) times daily.  Marland Kitchen COLCRYS 0.6 MG tablet TAKE ONE TABLET BY MOUTH ONCE DAILY TO TWICE DAILY AS NEEDED FOR  GOUT  . fluticasone (FLONASE) 50 MCG/ACT nasal spray Place 1 spray into both nostrils daily.  . hydrochlorothiazide (HYDRODIURIL) 25 MG tablet Take 1 tablet (25 mg total) by mouth daily.  Marland Kitchen ibuprofen (ADVIL,MOTRIN) 800 MG tablet TAKE 1 TABLET BY MOUTH EVERY 8 HOURS AS NEEDED FOR  MODERATE  PAIN  . lidocaine (XYLOCAINE) 5 % ointment Apply 1 application topically as needed.  . loratadine (CLARITIN) 10 MG tablet Take 10 mg by mouth daily.  . Multiple Vitamin (MULTIVITAMIN WITH MINERALS) TABS tablet Take 1 tablet by mouth daily.  . Pancrelipase, Lip-Prot-Amyl, (CREON) 24000-76000 units CPEP Take 1 capsule (24,000 Units total) by mouth 3 (three) times daily with meals.  . pantoprazole (PROTONIX) 40 MG tablet Take 30- 60 min before your first and last meals of the day  . sildenafil (VIAGRA) 50 MG tablet Take 1 tablet (50 mg total) by mouth daily as needed for erectile dysfunction.  Marland Kitchen SPIRULINA PO Take 1 capsule by mouth 3 (three) times daily.  . Tdap (BOOSTRIX) 5-2.5-18.5 LF-MCG/0.5 injection Inject 0.5 mLs into the muscle once for 1 dose.  . triamcinolone ointment (KENALOG) 0.5 % APPLY 1 APPLICATION TOPICALLY TWICE DAILY             Objective:    amb bm nad  Wt Readings from Last 3 Encounters:  06/23/18 200 lb 12.8 oz (91.1 kg)  06/23/18 209 lb (94.8 kg)  05/26/18 196 lb 9.6 oz (89.2 kg)      Vital signs reviewed - Note on arrival 02 sats  97% on RA      L > R te   HEENT: nl dentition,  and oropharynx. Nl external ear canals without cough reflex - Mod/severe bilateral non-specific turbinate edema  L >R    NECK :  without JVD/Nodes/TM/ nl carotid upstrokes bilaterally   LUNGS: no acc muscle use,  Nl contour chest which is clear to A and P bilaterally without cough on insp or exp maneuvers   CV:  RRR  no s3 or murmur or increase in P2, and no edema    ABD:  soft and nontender with nl inspiratory excursion in the supine position. No bruits or organomegaly appreciated, bowel sounds nl  MS:  Nl gait/ ext warm without deformities, calf tenderness, cyanosis or clubbing No obvious joint restrictions   SKIN: warm and dry without lesions    NEURO:  alert, approp, nl sensorium with  no motor or cerebellar deficits apparent.                    Assessment

## 2018-06-23 NOTE — Progress Notes (Signed)
Assessment & Plan:  Stephen Whitaker was seen today for follow-up.  Diagnoses and all orders for this visit:  Essential hypertension -     CMP14+EGFR Continue all antihypertensives as prescribed.  Remember to bring in your blood pressure log with you for your follow up appointment.  DASH/Mediterranean Diets are healthier choices for HTN.    Patient has been counseled on age-appropriate routine health concerns for screening and prevention. These are reviewed and up-to-date. Referrals have been placed accordingly. Immunizations are up-to-date or declined.    Subjective:   Chief Complaint  Patient presents with  . Follow-up    Pt. is here to follow-up on hypertension.    HPI Stephen Whitaker 62 y.o. male presents to office today for f/u to HTN. Blood prssure is elevated today however he reports not taking his HCTZ since last night due to his work schedule. I will have him return in 2 weeks for BP recheck and also check electrolytes today. May need to decrease or stop HCTZ due to past hypokalemia and add amlodipine based on next BP recheck. He states BP at home 130s/80s.    CHRONIC HYPERTENSION Disease Monitoring  Blood pressure range BP Readings from Last 3 Encounters:  06/23/18 (!) 144/89  05/26/18 140/82  03/21/18 137/90   Chest pain: no   Dyspnea: no   Claudication: no  Medication compliance: yes, taking HCTZ 61m daily  Medication Side Effects  Lightheadedness: no   Urinary frequency: no   Edema: no   Impotence: yes, taking viagra  Preventitive Healthcare:  Exercise: no   Diet Pattern: diet: general  Salt Restriction:  yes   Review of Systems  Constitutional: Negative for fever, malaise/fatigue and weight loss.  HENT: Negative.  Negative for nosebleeds.   Eyes: Negative.  Negative for blurred vision, double vision and photophobia.  Respiratory: Negative.  Negative for cough and shortness of breath.   Cardiovascular: Negative.  Negative for chest pain, palpitations and  leg swelling.  Gastrointestinal: Positive for heartburn. Negative for nausea and vomiting.  Genitourinary:       ED  Musculoskeletal: Negative.  Negative for myalgias.  Neurological: Negative.  Negative for dizziness, focal weakness, seizures and headaches.  Psychiatric/Behavioral: Negative.  Negative for suicidal ideas.    Past Medical History:  Diagnosis Date  . Arthritis   . Boil of buttock   . Chronic bronchitis ([facility]   . COPD (chronic obstructive pulmonary disease) ([facility])    Chronic Bronchitis.Advair inhaler   . Dyspnea   . GERD (gastroesophageal reflux disease)    takes Protonix daily  . Gout    takes Allopurinol daily  . Hemorrhoids   . Hypertension    takes HCTZ daily  . Pancreatitis    10 years ago  . Pneumonia 2016  . Tubular adenoma of colon     Past Surgical History:  Procedure Laterality Date  . AMPUTATION Left 07/14/2014   Procedure: 2nd Toe Amputation;  Surgeon: MNewt Minion MD;  [facility]  Service: Orthopedics;  Laterality: Left;  . AMPUTATION Left 11/16/2015   Procedure: Left Great Toe Amputation at Metatarsophalangeal Joint;  Surgeon: MNewt Minion MD;  [place] [facility]  Service: Orthopedics;  Laterality: Left;  . AMPUTATION Right 06/13/2016   Procedure: RIGHT 2nd Toe Amputation;  Surgeon: MNewt Minion MD;  [place] [facility]  Service: Orthopedics;  Laterality: Right;  . AMPUTATION Left 10/19/2016   Procedure: Amputation Left 3rd Toe;  Surgeon: DNewt Minion MD;  [place] [facility]  Service: Orthopedics;  Laterality: Left;  . TOE AMPUTATION Left 11/16/2015   left great toe   . TOE AMPUTATION Right 06/13/2016  . TONSILLECTOMY      Family History  Adopted: Yes  Problem Relation Age of Onset  . Cancer Sister 61       unknown origin     Social History Reviewed with no changes to be made today.   Outpatient Medications Prior to Visit  Medication Sig Dispense Refill  . albuterol (PROVENTIL HFA;VENTOLIN HFA) 108 (90 Base) MCG/ACT inhaler  Inhale 2 puffs into the lungs every 6 (six) hours as needed for wheezing or shortness of breath. 1 Inhaler 3  . allopurinol (ZYLOPRIM) 300 MG tablet Take 1 tablet (300 mg total) by mouth daily. 60 tablet 2  . Bioflavonoid Products (ESTER-C) TABS Take 1 tablet by mouth daily.    . budesonide-formoterol (SYMBICORT) 160-4.5 MCG/ACT inhaler Inhale 2 puffs into the lungs 2 (two) times daily. 1 Inhaler 0  . COLCRYS 0.6 MG tablet TAKE ONE TABLET BY MOUTH ONCE DAILY TO TWICE DAILY AS NEEDED FOR  GOUT 60 tablet 12  . fluticasone (FLONASE) 50 MCG/ACT nasal spray Place 1 spray into both nostrils daily. 16 g 3  . hydrochlorothiazide (HYDRODIURIL) 25 MG tablet Take 1 tablet (25 mg total) by mouth daily. 90 tablet 3  . ibuprofen (ADVIL,MOTRIN) 800 MG tablet TAKE 1 TABLET BY MOUTH EVERY 8 HOURS AS NEEDED FOR  MODERATE  PAIN 270 tablet 0  . lidocaine (XYLOCAINE) 5 % ointment Apply 1 application topically as needed. 50 g 1  . Multiple Vitamin (MULTIVITAMIN WITH MINERALS) TABS tablet Take 1 tablet by mouth daily. 90 tablet 3  . Pancrelipase, Lip-Prot-Amyl, (CREON) 24000-76000 units CPEP Take 1 capsule (24,000 Units total) by mouth 3 (three) times daily with meals. 180 capsule 3  . pantoprazole (PROTONIX) 40 MG tablet Take 30- 60 min before your first and last meals of the day 60 tablet 2  . sildenafil (VIAGRA) 50 MG tablet Take 1 tablet (50 mg total) by mouth daily as needed for erectile dysfunction. (Patient not taking: Reported on 06/23/2018) 5 tablet 1  . SPIRULINA PO Take 1 capsule by mouth 3 (three) times daily.    Marland Kitchen triamcinolone ointment (KENALOG) 0.5 % APPLY 1 APPLICATION TOPICALLY TWICE DAILY (Patient not taking: Reported on 06/23/2018) 30 g 1   Facility-Administered Medications Prior to Visit  Medication Dose Route Frequency Provider Last Rate Last Dose  . 0.9 %  sodium chloride infusion  500 mL Intravenous Continuous Pyrtle, Lajuan Lines, MD        Allergies  Allergen Reactions  . Ciprofloxacin Rash and Other  (See Comments)    SYNCOPE   . Shellfish Allergy Anaphylaxis  . Sulfa Antibiotics Other (See Comments)    SYNCOPE DIZZINESS        Objective:    BP (!) 144/89 (BP Location: Right Arm, Patient Position: Sitting, Cuff Size: Normal)   Pulse 94   Temp 98.6 F (37 C) (Oral)   Ht 6' 2.5" (1.892 m)   Wt 209 lb (94.8 kg)   SpO2 97%   BMI 26.48 kg/m  Wt Readings from Last 3 Encounters:  06/23/18 209 lb (94.8 kg)  05/26/18 196 lb 9.6 oz (89.2 kg)  03/21/18 196 lb 6.4 oz (89.1 kg)    Physical Exam  Constitutional: He is oriented to person, place, and time. He appears well-developed and well-nourished. He is cooperative.  HENT:  Head: Normocephalic and atraumatic.  Eyes: EOM are  normal.  Neck: Normal range of motion.  Cardiovascular: Normal rate, regular rhythm, normal heart sounds and intact distal pulses. Exam reveals no gallop and no friction rub.  No murmur heard. Pulmonary/Chest: Effort normal and breath sounds normal. No tachypnea. No respiratory distress. He has no decreased breath sounds. He has no wheezes. He has no rhonchi. He has no rales. He exhibits no tenderness.  Abdominal: Soft. Bowel sounds are normal.  Musculoskeletal: Normal range of motion. He exhibits no edema.  Neurological: He is alert and oriented to person, place, and time. Coordination normal.  Skin: Skin is warm and dry.  Psychiatric: He has a normal mood and affect. His behavior is normal. Judgment and thought content normal.  Nursing note and vitals reviewed.      Patient has been counseled extensively about nutrition and exercise as well as the importance of adherence with medications and regular follow-up. The patient was given clear instructions to go to ER or return to medical center if symptoms don't improve, worsen or new problems develop. The patient verbalized understanding.   Follow-up: Return in about 2 weeks (around 07/07/2018) for BP recheck with luke then 4 month appt with me.   [name], FNP-BC [facility] and [facility] [place], [place]   06/23/2018, 2:42 PM

## 2018-06-23 NOTE — Patient Instructions (Signed)
DASH Eating Plan DASH stands for "Dietary Approaches to Stop Hypertension." The DASH eating plan is a healthy eating plan that has been shown to reduce high blood pressure (hypertension). It may also reduce your risk for type 2 diabetes, heart disease, and stroke. The DASH eating plan may also help with weight loss. What are tips for following this plan? General guidelines  Avoid eating more than 2,300 mg (milligrams) of salt (sodium) a day. If you have hypertension, you may need to reduce your sodium intake to 1,500 mg a day.  Limit alcohol intake to no more than 1 drink a day for nonpregnant women and 2 drinks a day for men. One drink equals 12 oz of beer, 5 oz of wine, or 1 oz of hard liquor.  Work with your health care provider to maintain a healthy body weight or to lose weight. Ask what an ideal weight is for you.  Get at least 30 minutes of exercise that causes your heart to beat faster (aerobic exercise) most days of the week. Activities may include walking, swimming, or biking.  Work with your health care provider or diet and nutrition specialist (dietitian) to adjust your eating plan to your individual calorie needs. Reading food labels  Check food labels for the amount of sodium per serving. Choose foods with less than 5 percent of the Daily Value of sodium. Generally, foods with less than 300 mg of sodium per serving fit into this eating plan.  To find whole grains, look for the word "whole" as the first word in the ingredient list. Shopping  Buy products labeled as "low-sodium" or "no salt added."  Buy fresh foods. Avoid canned foods and premade or frozen meals. Cooking  Avoid adding salt when cooking. Use salt-free seasonings or herbs instead of table salt or sea salt. Check with your health care provider or pharmacist before using salt substitutes.  Do not fry foods. Cook foods using healthy methods such as baking, boiling, grilling, and broiling instead.  Cook with  heart-healthy oils, such as olive, canola, soybean, or sunflower oil. Meal planning   Eat a balanced diet that includes: ? 5 or more servings of fruits and vegetables each day. At each meal, try to fill half of your plate with fruits and vegetables. ? Up to 6-8 servings of whole grains each day. ? Less than 6 oz of lean meat, poultry, or fish each day. A 3-oz serving of meat is about the same size as a deck of cards. One egg equals 1 oz. ? 2 servings of low-fat dairy each day. ? A serving of nuts, seeds, or beans 5 times each week. ? Heart-healthy fats. Healthy fats called Omega-3 fatty acids are found in foods such as flaxseeds and coldwater fish, like sardines, salmon, and mackerel.  Limit how much you eat of the following: ? Canned or prepackaged foods. ? Food that is high in trans fat, such as fried foods. ? Food that is high in saturated fat, such as fatty meat. ? Sweets, desserts, sugary drinks, and other foods with added sugar. ? Full-fat dairy products.  Do not salt foods before eating.  Try to eat at least 2 vegetarian meals each week.  Eat more home-cooked food and less restaurant, buffet, and fast food.  When eating at a restaurant, ask that your food be prepared with less salt or no salt, if possible. What foods are recommended? The items listed may not be a complete list. Talk with your dietitian about what   dietary choices are best for you. Grains Whole-grain or whole-wheat bread. Whole-grain or whole-wheat pasta. Brown rice. Oatmeal. Quinoa. Bulgur. Whole-grain and low-sodium cereals. Pita bread. Low-fat, low-sodium crackers. Whole-wheat flour tortillas. Vegetables Fresh or frozen vegetables (raw, steamed, roasted, or grilled). Low-sodium or reduced-sodium tomato and vegetable juice. Low-sodium or reduced-sodium tomato sauce and tomato paste. Low-sodium or reduced-sodium canned vegetables. Fruits All fresh, dried, or frozen fruit. Canned fruit in natural juice (without  added sugar). Meat and other protein foods Skinless chicken or turkey. Ground chicken or turkey. Pork with fat trimmed off. Fish and seafood. Egg whites. Dried beans, peas, or lentils. Unsalted nuts, nut butters, and seeds. Unsalted canned beans. Lean cuts of beef with fat trimmed off. Low-sodium, lean deli meat. Dairy Low-fat (1%) or fat-free (skim) milk. Fat-free, low-fat, or reduced-fat cheeses. Nonfat, low-sodium ricotta or cottage cheese. Low-fat or nonfat yogurt. Low-fat, low-sodium cheese. Fats and oils Soft margarine without trans fats. Vegetable oil. Low-fat, reduced-fat, or light mayonnaise and salad dressings (reduced-sodium). Canola, safflower, olive, soybean, and sunflower oils. Avocado. Seasoning and other foods Herbs. Spices. Seasoning mixes without salt. Unsalted popcorn and pretzels. Fat-free sweets. What foods are not recommended? The items listed may not be a complete list. Talk with your dietitian about what dietary choices are best for you. Grains Baked goods made with fat, such as croissants, muffins, or some breads. Dry pasta or rice meal packs. Vegetables Creamed or fried vegetables. Vegetables in a cheese sauce. Regular canned vegetables (not low-sodium or reduced-sodium). Regular canned tomato sauce and paste (not low-sodium or reduced-sodium). Regular tomato and vegetable juice (not low-sodium or reduced-sodium). Pickles. Olives. Fruits Canned fruit in a light or heavy syrup. Fried fruit. Fruit in cream or butter sauce. Meat and other protein foods Fatty cuts of meat. Ribs. Fried meat. Bacon. Sausage. Bologna and other processed lunch meats. Salami. Fatback. Hotdogs. Bratwurst. Salted nuts and seeds. Canned beans with added salt. Canned or smoked fish. Whole eggs or egg yolks. Chicken or turkey with skin. Dairy Whole or 2% milk, cream, and half-and-half. Whole or full-fat cream cheese. Whole-fat or sweetened yogurt. Full-fat cheese. Nondairy creamers. Whipped toppings.  Processed cheese and cheese spreads. Fats and oils Butter. Stick margarine. Lard. Shortening. Ghee. Bacon fat. Tropical oils, such as coconut, palm kernel, or palm oil. Seasoning and other foods Salted popcorn and pretzels. Onion salt, garlic salt, seasoned salt, table salt, and sea salt. Worcestershire sauce. Tartar sauce. Barbecue sauce. Teriyaki sauce. Soy sauce, including reduced-sodium. Steak sauce. Canned and packaged gravies. Fish sauce. Oyster sauce. Cocktail sauce. Horseradish that you find on the shelf. Ketchup. Mustard. Meat flavorings and tenderizers. Bouillon cubes. Hot sauce and Tabasco sauce. Premade or packaged marinades. Premade or packaged taco seasonings. Relishes. Regular salad dressings. Where to find more information:  National Heart, Lung, and Blood Institute: www.nhlbi.nih.gov  American Heart Association: www.heart.org Summary  The DASH eating plan is a healthy eating plan that has been shown to reduce high blood pressure (hypertension). It may also reduce your risk for type 2 diabetes, heart disease, and stroke.  With the DASH eating plan, you should limit salt (sodium) intake to 2,300 mg a day. If you have hypertension, you may need to reduce your sodium intake to 1,500 mg a day.  When on the DASH eating plan, aim to eat more fresh fruits and vegetables, whole grains, lean proteins, low-fat dairy, and heart-healthy fats.  Work with your health care provider or diet and nutrition specialist (dietitian) to adjust your eating plan to your individual   calorie needs. This information is not intended to replace advice given to you by your health care provider. Make sure you discuss any questions you have with your health care provider. Document Released: 07/26/2011 Document Revised: 07/30/2016 Document Reviewed: 07/30/2016 Elsevier Interactive Patient Education  2018 Elsevier Inc.  

## 2018-06-24 ENCOUNTER — Encounter: Payer: Self-pay | Admitting: Internal Medicine

## 2018-06-24 LAB — CMP14+EGFR
A/G RATIO: 1.3 (ref 1.2–2.2)
ALT: 58 IU/L — AB (ref 0–44)
AST: 96 IU/L — AB (ref 0–40)
Albumin: 4.1 g/dL (ref 3.6–4.8)
Alkaline Phosphatase: 92 IU/L (ref 39–117)
BILIRUBIN TOTAL: 0.2 mg/dL (ref 0.0–1.2)
BUN/Creatinine Ratio: 13 (ref 10–24)
BUN: 13 mg/dL (ref 8–27)
CO2: 24 mmol/L (ref 20–29)
Calcium: 9 mg/dL (ref 8.6–10.2)
Chloride: 102 mmol/L (ref 96–106)
Creatinine, Ser: 0.98 mg/dL (ref 0.76–1.27)
GFR calc non Af Amer: 82 mL/min/{1.73_m2} (ref >59)
GFR, EST AFRICAN AMERICAN: 94 mL/min/{1.73_m2} (ref >59)
Globulin, Total: 3.1 g/dL (ref 1.5–4.5)
Glucose: 85 mg/dL (ref 65–99)
POTASSIUM: 3.9 mmol/L (ref 3.5–5.2)
SODIUM: 139 mmol/L (ref 134–144)
TOTAL PROTEIN: 7.2 g/dL (ref 6.0–8.5)

## 2018-06-24 NOTE — Assessment & Plan Note (Addendum)
rec ppi bid ac and referred to GI 05/26/2018    Still has sense of globus but improved on gerd rx and off DPI  >>> no change rx pending gi eval     I had an extended discussion with the patient reviewing all relevant studies completed to date and  lasting 15 to 20 minutes of a 25 minute visit    See device teaching which extended face to face time for this visit.  Each maintenance medication was reviewed in detail including emphasizing most importantly the difference between maintenance and prns and under what circumstances the prns are to be triggered using an action plan format that is not reflected in the computer generated alphabetically organized AVS which I have not found useful in most complex patients, especially with respiratory illnesses  Please see AVS for specific instructions unique to this visit that I personally wrote and verbalized to the the pt in detail and then reviewed with pt  by my nurse highlighting any  changes in therapy recommended at today's visit to their plan of care.

## 2018-06-24 NOTE — Assessment & Plan Note (Addendum)
Allergy profile 05/26/2018 >  Eos 0.1/  IgE  905  RAST pan pos - Spirometry 05/26/2018  FEV1 2.5 (75%)  Ratio 77 min curvature p am symb 160 x2 puffs  - 05/26/2018   continue symbicort 160 / leave off singulair as says can't afford it on [employer] plan/ submitted [place] and [place] symb - 06/23/2018  After extensive coaching inhaler device,  effectiveness =   90% from baseline 75%    As long as taking symb 160 regularly (which he hasn't due to cost/ access thru [employer]) >>>>  All goals of chronic asthma control met including optimal function and elimination of symptoms with minimal need for rescue therapy.  Contingencies discussed in full including contacting this office immediately if not controlling the symptoms using the rule of two's.      Reviewed: Formulary restrictions will be an ongoing challenge for the forseable future and I would be happy to pick an alternative if the pt will first  provide me a list of them but pt  will need to return here for training for any new device that is required eg dpi vs hfa vs respimat.    In meantime we can always provide samples so the patient never runs out of any needed respiratory medications.  We provided one sample and asked him to return in 2 weeks with his drug formulary if not able to obtain symbicort through [place] or [place].  When returns need also to sort out what generic singulair is costing him as may need this for rhinitis component vs refer to allergy for ? Shots vs biologics which will be much more expensive regimen than what he's presently on  (for both him and [employer])

## 2018-06-25 ENCOUNTER — Telehealth: Payer: Self-pay | Admitting: *Deleted

## 2018-06-25 MED ORDER — ALBUTEROL SULFATE HFA 108 (90 BASE) MCG/ACT IN AERS: 2.0000 | INHALATION_SPRAY | RESPIRATORY_TRACT | 3 refills | Status: DC | PRN

## 2018-06-25 NOTE — Telephone Encounter (Signed)
-----   Message from Tanda Rockers, MD sent at 06/24/2018  4:37 AM EST ----- I accidentally called in the proair respiclick > please change to hfa albuterol

## 2018-06-25 NOTE — Telephone Encounter (Signed)
Spoke with Dre at [facility] and cancelled the proair respiclick and sent regular proair electronically

## 2018-06-30 ENCOUNTER — Telehealth: Payer: Self-pay

## 2018-06-30 NOTE — Telephone Encounter (Signed)
-----   Message from [name], NP sent at 06/25/2018  9:26 PM EST ----- Liver enzymes are slightly increased. Potassium is normal. Will continue to monitor and repeat labs in 3 months

## 2018-06-30 NOTE — Telephone Encounter (Signed)
CMA spoke to patient to inform on results.  Pt. Verified DOB. Pt. Understood.  

## 2018-07-07 ENCOUNTER — Encounter: Payer: BLUE CROSS/BLUE SHIELD | Admitting: Pharmacist

## 2018-07-08 ENCOUNTER — Ambulatory Visit: Payer: BLUE CROSS/BLUE SHIELD | Admitting: Internal Medicine

## 2018-07-14 ENCOUNTER — Encounter: Payer: BLUE CROSS/BLUE SHIELD | Admitting: Internal Medicine

## 2018-07-28 ENCOUNTER — Telehealth: Payer: Self-pay | Admitting: Internal Medicine

## 2018-07-28 MED ORDER — BUDESONIDE-FORMOTEROL FUMARATE 160-4.5 MCG/ACT IN AERO: 2.0000 | INHALATION_SPRAY | Freq: Two times a day (BID) | RESPIRATORY_TRACT | 0 refills | Status: DC

## 2018-07-28 NOTE — Telephone Encounter (Signed)
Pt requesting Symbicort 160 samples.  Samples left up front for pt.  Pt aware.  Nothing further needed.

## 2018-08-22 ENCOUNTER — Other Ambulatory Visit: Payer: Self-pay | Admitting: Nurse Practitioner

## 2018-08-22 ENCOUNTER — Encounter: Payer: BLUE CROSS/BLUE SHIELD | Admitting: Internal Medicine

## 2018-08-22 DIAGNOSIS — M1A09X0 Idiopathic chronic gout, multiple sites, without tophus (tophi): Secondary | ICD-10-CM

## 2018-09-04 ENCOUNTER — Telehealth: Payer: Self-pay | Admitting: Hematology

## 2018-09-04 NOTE — Telephone Encounter (Signed)
[place] 1/22 - moved lab/fu to 1/29. Left message. Schedule mailed.

## 2018-09-10 ENCOUNTER — Ambulatory Visit: Payer: BLUE CROSS/BLUE SHIELD | Admitting: Hematology

## 2018-09-10 ENCOUNTER — Other Ambulatory Visit: Payer: BLUE CROSS/BLUE SHIELD

## 2018-09-17 ENCOUNTER — Inpatient Hospital Stay: Payer: BLUE CROSS/BLUE SHIELD

## 2018-09-17 ENCOUNTER — Inpatient Hospital Stay: Payer: BLUE CROSS/BLUE SHIELD | Admitting: Hematology

## 2018-09-23 ENCOUNTER — Ambulatory Visit (INDEPENDENT_AMBULATORY_CARE_PROVIDER_SITE_OTHER): Payer: BLUE CROSS/BLUE SHIELD

## 2018-09-23 ENCOUNTER — Encounter (INDEPENDENT_AMBULATORY_CARE_PROVIDER_SITE_OTHER): Payer: Self-pay | Admitting: Physician Assistant

## 2018-09-23 ENCOUNTER — Telehealth: Payer: Self-pay | Admitting: Hematology

## 2018-09-23 ENCOUNTER — Ambulatory Visit (INDEPENDENT_AMBULATORY_CARE_PROVIDER_SITE_OTHER): Payer: BLUE CROSS/BLUE SHIELD | Admitting: Physician Assistant

## 2018-09-23 VITALS — Ht 74.5 in | Wt 200.8 lb

## 2018-09-23 DIAGNOSIS — L97521 Non-pressure chronic ulcer of other part of left foot limited to breakdown of skin: Secondary | ICD-10-CM | POA: Diagnosis not present

## 2018-09-23 MED ORDER — DOXYCYCLINE HYCLATE 50 MG PO CAPS: 100.0000 mg | ORAL_CAPSULE | Freq: Two times a day (BID) | ORAL | 0 refills | Status: DC

## 2018-09-23 NOTE — Progress Notes (Signed)
Office Visit Note   Patient: Stephen Whitaker           Date of Birth: [birth year]           MRN: [id number] Visit Date: 09/23/2018              Requested by: [name], NP [address] [place], [place] [address] PCP: [name], NP  Chief Complaint  Patient presents with  . Left Foot - Follow-up    10/19/16 Left Foot 3rd Toe amp      HPI: The patient is a 63 yo gentleman with history of left foot first through third toe amputations who presents with new ulceration at the base of the fourth and fifth toes.  He reports he thinks it started about a week ago and he noticed some drainage on his socks from the toes.  He also noticed some pain in between the residual fourth and fifth toe and then noticed some cracking of the tissue.  He has not recently been on any antibiotics.  He did apply some antibiotic ointment after soaking his feet and salt water.  He works on a [employer] and does work 12-hour shifts.  He has to work this evening but reports he will then be off for a couple of days. He also reports a history of gout and he is taking his allopurinol.  He does not note any swelling erythema of the foot and this does not appear to be related to his gout flare.  Assessment & Plan: Visit Diagnoses:  1. Non-pressure chronic ulcer of other part of left foot limited to breakdown of skin (HCC)     Plan: Begin doxycycline 100 mg p.o. twice daily.  Also have him apply some Iodosorb ointment between the toes and at the base of both toes and some dry gauze and then he can reapply his sock to keep the area drier.  Advised the patient to not soak the foot but to wash the foot daily with Dial soap and water and then dry and apply the above.  Advised him to elevate and offload over the next week as much as possible.  He is going to follow-up in 1 week or sooner should he have difficulty in the interim.  Follow-Up Instructions: Return in about 1 week (around 09/30/2018).   Ortho  Exam  Patient is alert, oriented, no adenopathy, well-dressed, normal affect, normal respiratory effort. The patient has breakdown over the base of the fourth and fifth toes which appears to be superficial.  He also has breakdown between the fourth and fifth toes with greenish discharge and maceration of the tissue between the toes.  He has palpable pedal pulses.  His previous amputation sites are well-healed.  He does have some mild edema about the fourth toe but no erythema.  There is no cellulitis of the foot.  There is no edema of the lower leg today.  Imaging: Xr Foot Complete Left  Result Date: 09/23/2018 Radiographs of the left foot show the patient to be status post previous first through third toe amputation and partial ray amputation.  There is no signs of osteomyelitis   No images are attached to the encounter.  Labs: Lab Results  Component Value Date   HGBA1C 5.6 08/10/2014   ESRSEDRATE 27 (H) 07/05/2015   ESRSEDRATE 80 (H) 01/21/2015   CRP 1.8 (H) 07/05/2015   LABURIC 5.4 02/19/2018   LABURIC 6.0 08/10/2014   REPTSTATUS 01/22/2015 FINAL 01/21/2015  CULT NO GROWTH Performed at [facility]  01/21/2015     Lab Results  Component Value Date   ALBUMIN 4.1 06/23/2018   ALBUMIN 3.7 03/10/2018   ALBUMIN 3.9 01/23/2017   LABURIC 5.4 02/19/2018   LABURIC 6.0 08/10/2014    Body mass index is 25.44 kg/m.  Orders:  Orders Placed This Encounter  Procedures  . XR Foot Complete Left   Meds ordered this encounter  Medications  . doxycycline (VIBRAMYCIN) 50 MG capsule    Sig: Take 2 capsules (100 mg total) by mouth 2 (two) times daily.    Dispense:  56 capsule    Refill:  0     Procedures: No procedures performed  Clinical Data: No additional findings.  ROS:  All other systems negative, except as noted in the HPI. Review of Systems  Objective: Vital Signs: Ht 6' 2.5" (1.892 m)   Wt 200 lb 12.8 oz (91.1 kg)   BMI 25.44 kg/m   Specialty  Comments:  No specialty comments available.  PMFS History: Patient Active Problem List   Diagnosis Date Noted  . Acquired absence of other right toe(s) ([place]) 02/19/2018  . Boil of buttock 10/24/2016  . Amputated toe of left foot ([place]) 10/19/2016  . Amputated toe, right ([place]) 06/13/2016  . Chronic bronchitis ([place]) 11/24/2015  . Other pancytopenia ([place]) 01/24/2015  . Chronic pancreatitis ([place]) 01/24/2015  . Blood poisoning   . Neutropenia ([place])   . Iron deficiency anemia due to chronic blood loss   . Leukopenia   . Sepsis ([place]) 01/21/2015  . Leucopenia 01/21/2015  . Anemia 01/21/2015  . Syncope 01/21/2015  . Diarrhea 01/21/2015  . Hypokalemia 01/21/2015  . Loss of consciousness ([place])   . Eczema 10/21/2014  . Essential hypertension 10/21/2014  . Vasovagal syncope 10/21/2014  . Bilateral conjunctivitis 08/10/2014  . Other chronic pancreatitis ([place]) 08/10/2014  . COLD (chronic obstructive lung disease) ([place]) 08/10/2014  . Chronic ethmoidal sinusitis 08/10/2014  . Bronchitis 10/17/2010  . PLANTAR FASCIITIS, LEFT 10/17/2010  . OTHER NEUTROPENIA 04/21/2010  . CALLUS, TOE 03/15/2010  . LEUKOPENIA, MILD 02/20/2010  . LIVER FUNCTION TESTS, ABNORMAL, HX OF 02/16/2010  . DENTAL CARIES 02/15/2010  . ONYCHOMYCOSIS 07/05/2009  . ALLERGIC URTICARIA 07/05/2009  . PAIN IN LIMB 07/05/2009  . ERECTILE DYSFUNCTION 05/25/2008  . INFLUENZA 05/25/2008  . VIRAL INFECTION 10/01/2007  . HSV 05/02/2007  . CONDYLOMA ACUMINATUM 05/02/2007  . HYPERTENSION 05/02/2007  . ALLERGIC RHINITIS 05/02/2007  . Chronic asthma, mild persistent, uncomplicated 18/29/9371  . Gastroesophageal reflux disease without esophagitis 05/02/2007  . PANCREATITIS 05/02/2007   Past Medical History:  Diagnosis Date  . Arthritis   . Boil of buttock   . Chronic bronchitis ([place])   . COPD (chronic obstructive pulmonary disease) (HCC)    Chronic Bronchitis.Advair inhaler   . Dyspnea   . GERD (gastroesophageal reflux  disease)    takes Protonix daily  . Gout    takes Allopurinol daily  . Hemorrhoids   . Hypertension    takes HCTZ daily  . Pancreatitis    10 years ago  . Pneumonia 2016  . Tubular adenoma of colon     Family History  Adopted: Yes  Problem Relation Age of Onset  . Cancer Sister 52       unknown origin     Past Surgical History:  Procedure Laterality Date  . AMPUTATION Left 07/14/2014   Procedure: 2nd Toe Amputation;  Surgeon: [name], MD;  Location: [facility];  Service: Orthopedics;  Laterality: Left;  . AMPUTATION Left 11/16/2015   Procedure: Left Great Toe Amputation at Metatarsophalangeal Joint;  Surgeon: [name], MD;  Location: [facility];  Service: Orthopedics;  Laterality: Left;  . AMPUTATION Right 06/13/2016   Procedure: RIGHT 2nd Toe Amputation;  Surgeon: [name], MD;  Location: [facility];  Service: Orthopedics;  Laterality: Right;  . AMPUTATION Left 10/19/2016   Procedure: Amputation Left 3rd Toe;  Surgeon: [name], MD;  Location: [facility];  Service: Orthopedics;  Laterality: Left;  . TOE AMPUTATION Left 11/16/2015   left great toe   . TOE AMPUTATION Right 06/13/2016  . TONSILLECTOMY     Social History   Occupational History  . Not on file  Tobacco Use  . Smoking status: Never Smoker  . Smokeless tobacco: Never Used  Substance and Sexual Activity  . Alcohol use: Yes    Alcohol/week: 0.0 standard drinks    Comment: rarely  . Drug use: No  . Sexual activity: Not Currently

## 2018-09-23 NOTE — Telephone Encounter (Signed)
Called patient per 2/3 sch message - no answer - left message to call back if r/s is still needed.

## 2018-09-24 ENCOUNTER — Ambulatory Visit: Payer: BLUE CROSS/BLUE SHIELD | Admitting: Hematology

## 2018-09-24 ENCOUNTER — Other Ambulatory Visit: Payer: BLUE CROSS/BLUE SHIELD

## 2018-09-26 ENCOUNTER — Telehealth: Payer: Self-pay | Admitting: Internal Medicine

## 2018-09-26 MED ORDER — BUDESONIDE-FORMOTEROL FUMARATE 160-4.5 MCG/ACT IN AERO: 2.0000 | INHALATION_SPRAY | Freq: Two times a day (BID) | RESPIRATORY_TRACT | 6 refills | Status: DC

## 2018-09-26 NOTE — Telephone Encounter (Signed)
Spoke with pt.  Pt stated he needed a sample of symbicort because it was too expensive and not covered.  Stated he had filled out AZ[employer] paperwork but has not heard anything.  Instructed him to call insurance and get a formulary to bring to upcoming appt.  Will leave sample at front desk.  Tried to contact [employer].  Was on hold for 6 minutes.  Will try back

## 2018-09-26 NOTE — Telephone Encounter (Signed)
Patient would also like a prescription for Symbicort sent to [facility] on [address]. CB is [phone]

## 2018-09-26 NOTE — Telephone Encounter (Signed)
Called and spoke with Patient.  He requested a new prescription of Symbicort to be sent to [facility].  Prescription sent.  Nothing further at this time.

## 2018-09-30 ENCOUNTER — Ambulatory Visit (INDEPENDENT_AMBULATORY_CARE_PROVIDER_SITE_OTHER): Payer: BLUE CROSS/BLUE SHIELD | Admitting: Physician Assistant

## 2018-09-30 ENCOUNTER — Encounter (INDEPENDENT_AMBULATORY_CARE_PROVIDER_SITE_OTHER): Payer: Self-pay | Admitting: Physician Assistant

## 2018-09-30 VITALS — Ht 74.5 in | Wt 200.8 lb

## 2018-09-30 DIAGNOSIS — L97521 Non-pressure chronic ulcer of other part of left foot limited to breakdown of skin: Secondary | ICD-10-CM

## 2018-09-30 MED ORDER — DOXYCYCLINE HYCLATE 100 MG PO CAPS: 100.0000 mg | ORAL_CAPSULE | Freq: Two times a day (BID) | ORAL | 1 refills | Status: DC

## 2018-09-30 NOTE — Progress Notes (Signed)
Office Visit Note   Patient: Stephen Whitaker           Date of Birth: [birth year]           MRN: [id number] Visit Date: 09/30/2018              Requested by: [name], NP [address] [place], [place] [address] PCP: [name], NP  Chief Complaint  Patient presents with  . Left Foot - Follow-up      HPI: The patient is a 63 year old gentleman with a history of left foot first through third toe amputations who presents for follow-up with ulcerations at the base of the fourth and fifth toes.  This started about 2 weeks ago nail.  His pain is much improved.  He has been taking doxycycline 100 mg p.o. twice daily.  He is utilizing Iodosorb ointment to the area and reports that it is much drier and draining less.  Assessment & Plan: Visit Diagnoses:  1. Non-pressure chronic ulcer of other part of left foot limited to breakdown of skin (HCC)     Plan: Continue doxycycline 100 mg p.o. twice daily, continue Iodosorb ointment to the foot daily after washing with Dial soap and water.  Continue to offload and elevate as much as possible.  Follow-up in 2 weeks.  Follow-Up Instructions: Return in about 2 weeks (around 10/14/2018).   Ortho Exam  Patient is alert, oriented, no adenopathy, well-dressed, normal affect, normal respiratory effort. The ulcers over the base of the fourth and fifth toes are much improved.  The fourth toe is completely healed at the webspace between the fourth and fifth toes still has a small residual crack 1 ulcer that has no signs current infection, scant serous appearing drainage.  There are no signs of cellulitis.  Good pedal pulses.  No signs of erythema or edema.  Imaging: No results found. No images are attached to the encounter.  Labs: Lab Results  Component Value Date   HGBA1C 5.6 08/10/2014   ESRSEDRATE 27 (H) 07/05/2015   ESRSEDRATE 80 (H) 01/21/2015   CRP 1.8 (H) 07/05/2015   LABURIC 5.4 02/19/2018   LABURIC 6.0 08/10/2014   REPTSTATUS 01/22/2015 FINAL 01/21/2015   CULT NO GROWTH Performed at [facility]  01/21/2015     Lab Results  Component Value Date   ALBUMIN 4.1 06/23/2018   ALBUMIN 3.7 03/10/2018   ALBUMIN 3.9 01/23/2017   LABURIC 5.4 02/19/2018   LABURIC 6.0 08/10/2014    Body mass index is 25.44 kg/m.  Orders:  No orders of the defined types were placed in this encounter.  Meds ordered this encounter  Medications  . doxycycline (VIBRAMYCIN) 100 MG capsule    Sig: Take 1 capsule (100 mg total) by mouth 2 (two) times daily.    Dispense:  28 capsule    Refill:  1     Procedures: No procedures performed  Clinical Data: No additional findings.  ROS:  All other systems negative, except as noted in the HPI. Review of Systems  Objective: Vital Signs: Ht 6' 2.5" (1.892 m)   Wt 200 lb 12.8 oz (91.1 kg)   BMI 25.44 kg/m   Specialty Comments:  No specialty comments available.  PMFS History: Patient Active Problem List   Diagnosis Date Noted  . Acquired absence of other right toe(s) ([place]) 02/19/2018  . Boil of buttock 10/24/2016  . Amputated toe of left foot ([place]) 10/19/2016  . Amputated toe, right ([place]) 06/13/2016  .  Chronic bronchitis ([place]) 11/24/2015  . Other pancytopenia ([place]) 01/24/2015  . Chronic pancreatitis ([place]) 01/24/2015  . Blood poisoning   . Neutropenia ([place])   . Iron deficiency anemia due to chronic blood loss   . Leukopenia   . Sepsis ([place]) 01/21/2015  . Leucopenia 01/21/2015  . Anemia 01/21/2015  . Syncope 01/21/2015  . Diarrhea 01/21/2015  . Hypokalemia 01/21/2015  . Loss of consciousness ([place])   . Eczema 10/21/2014  . Essential hypertension 10/21/2014  . Vasovagal syncope 10/21/2014  . Bilateral conjunctivitis 08/10/2014  . Other chronic pancreatitis ([place]) 08/10/2014  . COLD (chronic obstructive lung disease) ([place]) 08/10/2014  . Chronic ethmoidal sinusitis 08/10/2014  . Bronchitis 10/17/2010  . PLANTAR FASCIITIS, LEFT 10/17/2010  .  OTHER NEUTROPENIA 04/21/2010  . CALLUS, TOE 03/15/2010  . LEUKOPENIA, MILD 02/20/2010  . LIVER FUNCTION TESTS, ABNORMAL, HX OF 02/16/2010  . DENTAL CARIES 02/15/2010  . ONYCHOMYCOSIS 07/05/2009  . ALLERGIC URTICARIA 07/05/2009  . PAIN IN LIMB 07/05/2009  . ERECTILE DYSFUNCTION 05/25/2008  . INFLUENZA 05/25/2008  . VIRAL INFECTION 10/01/2007  . HSV 05/02/2007  . CONDYLOMA ACUMINATUM 05/02/2007  . HYPERTENSION 05/02/2007  . ALLERGIC RHINITIS 05/02/2007  . Chronic asthma, mild persistent, uncomplicated 34/91/7915  . Gastroesophageal reflux disease without esophagitis 05/02/2007  . PANCREATITIS 05/02/2007   Past Medical History:  Diagnosis Date  . Arthritis   . Boil of buttock   . Chronic bronchitis ([place])   . COPD (chronic obstructive pulmonary disease) (HCC)    Chronic Bronchitis.Advair inhaler   . Dyspnea   . GERD (gastroesophageal reflux disease)    takes Protonix daily  . Gout    takes Allopurinol daily  . Hemorrhoids   . Hypertension    takes HCTZ daily  . Pancreatitis    10 years ago  . Pneumonia 2016  . Tubular adenoma of colon     Family History  Adopted: Yes  Problem Relation Age of Onset  . Cancer Sister 39       unknown origin     Past Surgical History:  Procedure Laterality Date  . AMPUTATION Left 07/14/2014   Procedure: 2nd Toe Amputation;  Surgeon: [name], MD;  Location: [facility];  Service: Orthopedics;  Laterality: Left;  . AMPUTATION Left 11/16/2015   Procedure: Left Great Toe Amputation at Metatarsophalangeal Joint;  Surgeon: [name], MD;  Location: [facility];  Service: Orthopedics;  Laterality: Left;  . AMPUTATION Right 06/13/2016   Procedure: RIGHT 2nd Toe Amputation;  Surgeon: [name], MD;  Location: [facility];  Service: Orthopedics;  Laterality: Right;  . AMPUTATION Left 10/19/2016   Procedure: Amputation Left 3rd Toe;  Surgeon: [name], MD;  Location: [facility];  Service: Orthopedics;  Laterality: Left;  . TOE AMPUTATION Left 11/16/2015    left great toe   . TOE AMPUTATION Right 06/13/2016  . TONSILLECTOMY     Social History   Occupational History  . Not on file  Tobacco Use  . Smoking status: Never Smoker  . Smokeless tobacco: Never Used  Substance and Sexual Activity  . Alcohol use: Yes    Alcohol/week: 0.0 standard drinks    Comment: rarely  . Drug use: No  . Sexual activity: Not Currently

## 2018-10-12 ENCOUNTER — Other Ambulatory Visit: Payer: Self-pay | Admitting: Internal Medicine

## 2018-10-12 DIAGNOSIS — K861 Other chronic pancreatitis: Secondary | ICD-10-CM

## 2018-10-13 ENCOUNTER — Ambulatory Visit: Payer: BLUE CROSS/BLUE SHIELD | Admitting: Internal Medicine

## 2018-10-14 ENCOUNTER — Ambulatory Visit (INDEPENDENT_AMBULATORY_CARE_PROVIDER_SITE_OTHER): Payer: BLUE CROSS/BLUE SHIELD | Admitting: Physician Assistant

## 2018-10-14 ENCOUNTER — Encounter (INDEPENDENT_AMBULATORY_CARE_PROVIDER_SITE_OTHER): Payer: Self-pay | Admitting: Physician Assistant

## 2018-10-14 DIAGNOSIS — L97521 Non-pressure chronic ulcer of other part of left foot limited to breakdown of skin: Secondary | ICD-10-CM

## 2018-10-14 MED ORDER — DOXYCYCLINE HYCLATE 100 MG PO CAPS: 100.0000 mg | ORAL_CAPSULE | Freq: Two times a day (BID) | ORAL | 1 refills | Status: DC

## 2018-10-14 NOTE — Progress Notes (Signed)
Office Visit Note   Patient: Stephen Whitaker           Date of Birth: [birth year]           MRN: [id number] Visit Date: 10/14/2018              Requested by: [name], NP [address] [place], [place] [address] PCP: [name], NP  Chief Complaint  Patient presents with  . Left Foot - Follow-up      HPI: The patient is a 63 year old gentleman with a history of left foot first through third toe amputations who presents for follow-up of some ulcerations at the base of the fourth and fifth residual toes.  He has been applying some Iodosorb ointment to the areas and taking doxycycline 100 mg twice a day.  The areas are improving and have no drainage and no odor.  Assessment & Plan: Visit Diagnoses:  1. Non-pressure chronic ulcer of other part of left foot limited to breakdown of skin ([place])     Plan: After informed consent the necrotic skin and ulcer area was debrided with a #10 blade knife and the patient tolerated this well.  He will continue on doxycycline 100 mg p.o. twice daily and Iodosorb to the area.  He will follow-up in 2 weeks.  Follow-Up Instructions: Return in about 2 weeks (around 10/28/2018).   Ortho Exam  Patient is alert, oriented, no adenopathy, well-dressed, normal affect, normal respiratory effort. The cracked skin and ulceration over the base of the fourth and fifth toes are much drier but he has calluses and a small amount of residual ulcer about the webspace between the fourth and fifth toe.  This was debrided with a #10 blade knife and the patient tolerated this well.  It was debrided to healthy bleeding tissue and hemostasis was achieved with silver nitrate.  There are no signs of cellulitis of the foot.  Palpable pedal pulses.  Imaging: No results found. No images are attached to the encounter.  Labs: Lab Results  Component Value Date   HGBA1C 5.6 08/10/2014   ESRSEDRATE 27 (H) 07/05/2015   ESRSEDRATE 80 (H) 01/21/2015   CRP 1.8 (H)  07/05/2015   LABURIC 5.4 02/19/2018   LABURIC 6.0 08/10/2014   REPTSTATUS 01/22/2015 FINAL 01/21/2015   CULT NO GROWTH Performed at [facility]  01/21/2015     Lab Results  Component Value Date   ALBUMIN 4.1 06/23/2018   ALBUMIN 3.7 03/10/2018   ALBUMIN 3.9 01/23/2017   LABURIC 5.4 02/19/2018   LABURIC 6.0 08/10/2014    Body mass index is 25.44 kg/m.  Orders:  No orders of the defined types were placed in this encounter.  Meds ordered this encounter  Medications  . doxycycline (VIBRAMYCIN) 100 MG capsule    Sig: Take 1 capsule (100 mg total) by mouth 2 (two) times daily.    Dispense:  28 capsule    Refill:  1     Procedures: No procedures performed  Clinical Data: No additional findings.  ROS:  All other systems negative, except as noted in the HPI. Review of Systems  Objective: Vital Signs: Ht 6' 2.5" (1.892 m)   Wt 200 lb 12.8 oz (91.1 kg)   BMI 25.44 kg/m   Specialty Comments:  No specialty comments available.  PMFS History: Patient Active Problem List   Diagnosis Date Noted  . Acquired absence of other right toe(s) ([place]) 02/19/2018  . Boil of buttock 10/24/2016  .  Amputated toe of left foot ([place]) 10/19/2016  . Amputated toe, right ([place]) 06/13/2016  . Chronic bronchitis ([place]) 11/24/2015  . Other pancytopenia ([place]) 01/24/2015  . Chronic pancreatitis ([place]) 01/24/2015  . Blood poisoning   . Neutropenia ([place])   . Iron deficiency anemia due to chronic blood loss   . Leukopenia   . Sepsis ([place]) 01/21/2015  . Leucopenia 01/21/2015  . Anemia 01/21/2015  . Syncope 01/21/2015  . Diarrhea 01/21/2015  . Hypokalemia 01/21/2015  . Loss of consciousness ([place])   . Eczema 10/21/2014  . Essential hypertension 10/21/2014  . Vasovagal syncope 10/21/2014  . Bilateral conjunctivitis 08/10/2014  . Other chronic pancreatitis ([place]) 08/10/2014  . COLD (chronic obstructive lung disease) ([place]) 08/10/2014  . Chronic ethmoidal sinusitis 08/10/2014  .  Bronchitis 10/17/2010  . PLANTAR FASCIITIS, LEFT 10/17/2010  . OTHER NEUTROPENIA 04/21/2010  . CALLUS, TOE 03/15/2010  . LEUKOPENIA, MILD 02/20/2010  . LIVER FUNCTION TESTS, ABNORMAL, HX OF 02/16/2010  . DENTAL CARIES 02/15/2010  . ONYCHOMYCOSIS 07/05/2009  . ALLERGIC URTICARIA 07/05/2009  . PAIN IN LIMB 07/05/2009  . ERECTILE DYSFUNCTION 05/25/2008  . INFLUENZA 05/25/2008  . VIRAL INFECTION 10/01/2007  . HSV 05/02/2007  . CONDYLOMA ACUMINATUM 05/02/2007  . HYPERTENSION 05/02/2007  . ALLERGIC RHINITIS 05/02/2007  . Chronic asthma, mild persistent, uncomplicated 22/09/5425  . Gastroesophageal reflux disease without esophagitis 05/02/2007  . PANCREATITIS 05/02/2007   Past Medical History:  Diagnosis Date  . Arthritis   . Boil of buttock   . Chronic bronchitis ([place])   . COPD (chronic obstructive pulmonary disease) (HCC)    Chronic Bronchitis.Advair inhaler   . Dyspnea   . GERD (gastroesophageal reflux disease)    takes Protonix daily  . Gout    takes Allopurinol daily  . Hemorrhoids   . Hypertension    takes HCTZ daily  . Pancreatitis    10 years ago  . Pneumonia 2016  . Tubular adenoma of colon     Family History  Adopted: Yes  Problem Relation Age of Onset  . Cancer Sister 39       unknown origin     Past Surgical History:  Procedure Laterality Date  . AMPUTATION Left 07/14/2014   Procedure: 2nd Toe Amputation;  Surgeon: [name], MD;  Location: [facility];  Service: Orthopedics;  Laterality: Left;  . AMPUTATION Left 11/16/2015   Procedure: Left Great Toe Amputation at Metatarsophalangeal Joint;  Surgeon: [name], MD;  Location: [facility];  Service: Orthopedics;  Laterality: Left;  . AMPUTATION Right 06/13/2016   Procedure: RIGHT 2nd Toe Amputation;  Surgeon: [name], MD;  Location: [facility];  Service: Orthopedics;  Laterality: Right;  . AMPUTATION Left 10/19/2016   Procedure: Amputation Left 3rd Toe;  Surgeon: [name], MD;  Location: [facility];  Service:  Orthopedics;  Laterality: Left;  . TOE AMPUTATION Left 11/16/2015   left great toe   . TOE AMPUTATION Right 06/13/2016  . TONSILLECTOMY     Social History   Occupational History  . Not on file  Tobacco Use  . Smoking status: Never Smoker  . Smokeless tobacco: Never Used  Substance and Sexual Activity  . Alcohol use: Yes    Alcohol/week: 0.0 standard drinks    Comment: rarely  . Drug use: No  . Sexual activity: Not Currently

## 2018-10-15 ENCOUNTER — Ambulatory Visit: Payer: BLUE CROSS/BLUE SHIELD | Admitting: Internal Medicine

## 2018-10-17 ENCOUNTER — Ambulatory Visit: Payer: BLUE CROSS/BLUE SHIELD | Admitting: Internal Medicine

## 2018-10-22 ENCOUNTER — Ambulatory Visit: Payer: BLUE CROSS/BLUE SHIELD | Attending: Nurse Practitioner | Admitting: Nurse Practitioner

## 2018-10-22 ENCOUNTER — Encounter: Payer: Self-pay | Admitting: Nurse Practitioner

## 2018-10-22 ENCOUNTER — Ambulatory Visit: Payer: BLUE CROSS/BLUE SHIELD | Admitting: Nurse Practitioner

## 2018-10-22 VITALS — BP 151/76 | HR 69 | Temp 98.7°F | Ht 74.5 in | Wt 206.0 lb

## 2018-10-22 DIAGNOSIS — K089 Disorder of teeth and supporting structures, unspecified: Secondary | ICD-10-CM

## 2018-10-22 DIAGNOSIS — I10 Essential (primary) hypertension: Secondary | ICD-10-CM | POA: Diagnosis not present

## 2018-10-22 MED ORDER — AMLODIPINE BESYLATE 5 MG PO TABS: 5.0000 mg | ORAL_TABLET | Freq: Every day | ORAL | 3 refills | Status: DC

## 2018-10-22 NOTE — Progress Notes (Signed)
Assessment & Plan:  Stephen Whitaker was seen today for follow-up.  Diagnoses and all orders for this visit:  Essential hypertension -     CBC -     CMP14+EGFR -     amLODipine (NORVASC) 5 MG tablet; Take 1 tablet (5 mg total) by mouth daily. Continue all antihypertensives as prescribed.  Remember to bring in your blood pressure log with you for your follow up appointment.  DASH/Mediterranean Diets are healthier choices for HTN.    Poor dentition -     Ambulatory referral to Dentistry    Patient has been counseled on age-appropriate routine health concerns for screening and prevention. These are reviewed and up-to-date. Referrals have been placed accordingly. Immunizations are up-to-date or declined.    Subjective:   Chief Complaint  Patient presents with  . Follow-up    Pt. is here to follow-up on HTN.    HPI Stephen Whitaker 63 y.o. male presents to office today for HTN follow up. Currently being followed by Ortho for Non-pressure chronic ulcer of other part of left foot limited to breakdown of skin ([place]).   CHRONIC HYPERTENSION Disease Monitoring  Blood pressure range BP Readings from Last 3 Encounters:  10/22/18 (!) 151/76  06/23/18 (!) 150/90  06/23/18 (!) 144/89  Currently taking HCTZ 93m and blood pressure is elevated. Will start amlodipine 558mtoday.   Chest pain: no   Dyspnea: no   Claudication: no  Medication compliance: yes  Medication Side Effects  Lightheadedness: no   Urinary frequency: no   Edema: no   Impotence: no  Preventitive Healthcare:  Exercise: no   Diet Pattern: diet: general  Salt Restriction:  no   Review of Systems  Constitutional: Negative for fever, malaise/fatigue and weight loss.  HENT: Negative.  Negative for nosebleeds.   Eyes: Negative.  Negative for blurred vision, double vision and photophobia.  Respiratory: Negative.  Negative for cough and shortness of breath.   Cardiovascular: Negative.  Negative for chest pain,  palpitations and leg swelling.  Gastrointestinal: Positive for heartburn. Negative for nausea and vomiting.  Musculoskeletal: Negative for falls.  Neurological: Negative.  Negative for dizziness, focal weakness, seizures and headaches.  Psychiatric/Behavioral: Negative.  Negative for suicidal ideas.    Past Medical History:  Diagnosis Date  . Arthritis   . Boil of buttock   . Chronic bronchitis ([place]  . COPD (chronic obstructive pulmonary disease) (HCC)    Chronic Bronchitis.Advair inhaler   . Dyspnea   . GERD (gastroesophageal reflux disease)    takes Protonix daily  . Gout    takes Allopurinol daily  . Hemorrhoids   . Hypertension    takes HCTZ daily  . Pancreatitis    10 years ago  . Pneumonia 2016  . Tubular adenoma of colon     Past Surgical History:  Procedure Laterality Date  . AMPUTATION Left 07/14/2014   Procedure: 2nd Toe Amputation;  Surgeon: MaNewt MinionMD;  [place] [facility] Service: Orthopedics;  Laterality: Left;  . AMPUTATION Left 11/16/2015   Procedure: Left Great Toe Amputation at Metatarsophalangeal Joint;  Surgeon: MaNewt MinionMD;  [facility] Service: Orthopedics;  Laterality: Left;  . AMPUTATION Right 06/13/2016   Procedure: RIGHT 2nd Toe Amputation;  Surgeon: MaNewt MinionMD;  [facility]: [facility];  Laterality: Right;  . AMPUTATION Left 10/19/2016   Procedure: Amputation Left 3rd Toe;  Surgeon: DuNewt MinionMD;  [facility] Service:  Orthopedics;  Laterality: Left;  . TOE AMPUTATION Left 11/16/2015   left great toe   . TOE AMPUTATION Right 06/13/2016  . TONSILLECTOMY      Family History  Adopted: Yes  Problem Relation Age of Onset  . Cancer Sister 66       unknown origin     Social History Reviewed with no changes to be made today.   Outpatient Medications Prior to Visit  Medication Sig Dispense Refill  . albuterol (PROAIR HFA) 108 (90 Base) MCG/ACT inhaler Inhale 2 puffs into the lungs every 4 (four)  hours as needed for wheezing or shortness of breath. 1 Inhaler 3  . Albuterol Sulfate (PROAIR RESPICLICK) 409 (90 Base) MCG/ACT AEPB Inhale 2 puffs into the lungs 4 (four) times daily as needed. 1 each 1  . allopurinol (ZYLOPRIM) 300 MG tablet TAKE 1 TABLET BY MOUTH ONCE DAILY 60 tablet 0  . Bioflavonoid Products (ESTER-C) TABS Take 1 tablet by mouth daily.    Marland Kitchen COLCRYS 0.6 MG tablet TAKE ONE TABLET BY MOUTH ONCE DAILY TO TWICE DAILY AS NEEDED FOR  GOUT 60 tablet 12  . CREON 24000-76000 units CPEP TAKE 1 CAPSULE BY MOUTH THREE TIMES DAILY WITH MEALS 180 each 0  . doxycycline (VIBRAMYCIN) 100 MG capsule Take 1 capsule (100 mg total) by mouth 2 (two) times daily. 28 capsule 1  . fluticasone (FLONASE) 50 MCG/ACT nasal spray Place 1 spray into both nostrils daily. 16 g 3  . hydrochlorothiazide (HYDRODIURIL) 25 MG tablet Take 1 tablet (25 mg total) by mouth daily. 90 tablet 3  . ibuprofen (ADVIL,MOTRIN) 800 MG tablet TAKE 1 TABLET BY MOUTH EVERY 8 HOURS AS NEEDED FOR  MODERATE  PAIN 270 tablet 0  . lidocaine (XYLOCAINE) 5 % ointment Apply 1 application topically as needed. 50 g 1  . loratadine (CLARITIN) 10 MG tablet Take 10 mg by mouth daily.    . Multiple Vitamin (MULTIVITAMIN WITH MINERALS) TABS tablet Take 1 tablet by mouth daily. 90 tablet 3  . pantoprazole (PROTONIX) 40 MG tablet Take 30- 60 min before your first and last meals of the day 60 tablet 2  . SPIRULINA PO Take 1 capsule by mouth 3 (three) times daily.    Marland Kitchen triamcinolone ointment (KENALOG) 0.5 % APPLY 1 APPLICATION TOPICALLY TWICE DAILY 30 g 1  . budesonide-formoterol (SYMBICORT) 160-4.5 MCG/ACT inhaler Inhale 2 puffs into the lungs 2 (two) times daily. 1 Inhaler 0  . budesonide-formoterol (SYMBICORT) 160-4.5 MCG/ACT inhaler Inhale 2 puffs into the lungs 2 (two) times daily. (Patient not taking: Reported on 10/22/2018) 1 Inhaler 6  . budesonide-formoterol (SYMBICORT) 160-4.5 MCG/ACT inhaler Inhale 2 puffs into the lungs 2 (two) times  daily. (Patient not taking: Reported on 10/22/2018) 6 g 6  . sildenafil (VIAGRA) 50 MG tablet Take 1 tablet (50 mg total) by mouth daily as needed for erectile dysfunction. (Patient not taking: Reported on 10/22/2018) 5 tablet 1   No facility-administered medications prior to visit.     Allergies  Allergen Reactions  . Ciprofloxacin Rash and Other (See Comments)    SYNCOPE   . Shellfish Allergy Anaphylaxis  . Sulfa Antibiotics Other (See Comments)    SYNCOPE DIZZINESS        Objective:    BP (!) 151/76 (BP Location: Left Arm, Patient Position: Sitting, Cuff Size: Normal)   Pulse 69   Temp 98.7 F (37.1 C) (Oral)   Ht 6' 2.5" (1.892 m)   Wt 206 lb (93.4 kg)  SpO2 97%   BMI 26.10 kg/m  Wt Readings from Last 3 Encounters:  10/22/18 206 lb (93.4 kg)  10/14/18 200 lb 12.8 oz (91.1 kg)  09/30/18 200 lb 12.8 oz (91.1 kg)    Physical Exam Vitals signs and nursing note reviewed.  Constitutional:      Appearance: He is well-developed.  HENT:     Head: Normocephalic and atraumatic.  Neck:     Musculoskeletal: Normal range of motion.  Cardiovascular:     Rate and Rhythm: Normal rate and regular rhythm.     Heart sounds: Normal heart sounds. No murmur. No friction rub. No gallop.   Pulmonary:     Effort: Pulmonary effort is normal. No tachypnea or respiratory distress.     Breath sounds: Normal breath sounds. No decreased breath sounds, wheezing, rhonchi or rales.  Chest:     Chest wall: No tenderness.  Abdominal:     General: Bowel sounds are normal.     Palpations: Abdomen is soft.  Skin:    General: Skin is warm and dry.  Neurological:     Mental Status: He is alert and oriented to person, place, and time.     Coordination: Coordination normal.  Psychiatric:        Behavior: Behavior normal. Behavior is cooperative.        Thought Content: Thought content normal.        Judgment: Judgment normal.        Patient has been counseled extensively about nutrition and  exercise as well as the importance of adherence with medications and regular follow-up. The patient was given clear instructions to go to ER or return to medical center if symptoms don't improve, worsen or new problems develop. The patient verbalized understanding.   Follow-up: Return in about 2 weeks (around 11/05/2018) for BP recheck with LUKE or me .   [name], FNP-BC [facility], [employer]   10/22/2018, 5:34 PM

## 2018-10-23 ENCOUNTER — Telehealth: Payer: Self-pay | Admitting: Internal Medicine

## 2018-10-23 LAB — CBC
Hematocrit: 36.7 % — ABNORMAL LOW (ref 37.5–51.0)
Hemoglobin: 11.7 g/dL — ABNORMAL LOW (ref 13.0–17.7)
MCH: 27.2 pg (ref 26.6–33.0)
MCHC: 31.9 g/dL (ref 31.5–35.7)
MCV: 85 fL (ref 79–97)
Platelets: 219 10*3/uL (ref 150–450)
RBC: 4.3 x10E6/uL (ref 4.14–5.80)
RDW: 13.2 % (ref 11.6–15.4)
WBC: 2.5 10*3/uL — CL (ref 3.4–10.8)

## 2018-10-23 LAB — CMP14+EGFR
A/G RATIO: 1.3 (ref 1.2–2.2)
ALT: 48 IU/L — ABNORMAL HIGH (ref 0–44)
AST: 75 IU/L — ABNORMAL HIGH (ref 0–40)
Albumin: 3.9 g/dL (ref 3.8–4.8)
Alkaline Phosphatase: 96 IU/L (ref 39–117)
BILIRUBIN TOTAL: 0.2 mg/dL (ref 0.0–1.2)
BUN/Creatinine Ratio: 18 (ref 10–24)
BUN: 14 mg/dL (ref 8–27)
CO2: 25 mmol/L (ref 20–29)
Calcium: 9.2 mg/dL (ref 8.6–10.2)
Chloride: 104 mmol/L (ref 96–106)
Creatinine, Ser: 0.8 mg/dL (ref 0.76–1.27)
GFR calc Af Amer: 110 mL/min/{1.73_m2} (ref >59)
GFR calc non Af Amer: 95 mL/min/{1.73_m2} (ref >59)
Globulin, Total: 2.9 g/dL (ref 1.5–4.5)
Glucose: 91 mg/dL (ref 65–99)
POTASSIUM: 3.8 mmol/L (ref 3.5–5.2)
Sodium: 144 mmol/L (ref 134–144)
Total Protein: 6.8 g/dL (ref 6.0–8.5)

## 2018-10-23 MED ORDER — BUDESONIDE-FORMOTEROL FUMARATE 160-4.5 MCG/ACT IN AERO: 2.0000 | INHALATION_SPRAY | Freq: Two times a day (BID) | RESPIRATORY_TRACT | 0 refills | Status: DC

## 2018-10-23 NOTE — Telephone Encounter (Signed)
Called and spoke with patient states he uses symbicort 160. Let him know we would set a sample at the front for him to pick up tomorrow 10/24/18. Patient verbalized understanding. Samples set up front.  Nothing else needed.

## 2018-10-27 ENCOUNTER — Telehealth: Payer: Self-pay

## 2018-10-27 ENCOUNTER — Ambulatory Visit: Payer: BLUE CROSS/BLUE SHIELD | Admitting: Internal Medicine

## 2018-10-27 NOTE — Telephone Encounter (Signed)
-----   Message from [name], NP sent at 10/26/2018  8:19 PM EDT ----- Stephen Whitaker is low again. Please make sure you reschedule with Dr. Burr Medico as soon as possible as it is time for your follow up. Kidney function is normal and liver function is stable.

## 2018-10-27 NOTE — Telephone Encounter (Signed)
CMA spoke to patient.  Patient verified DOB. Patient was informed on lab results and PCP advising.  Pt. Understood.

## 2018-10-28 ENCOUNTER — Ambulatory Visit (INDEPENDENT_AMBULATORY_CARE_PROVIDER_SITE_OTHER): Payer: BLUE CROSS/BLUE SHIELD | Admitting: Physician Assistant

## 2018-11-05 ENCOUNTER — Ambulatory Visit: Payer: BLUE CROSS/BLUE SHIELD | Attending: Nurse Practitioner | Admitting: Pharmacist

## 2018-11-05 ENCOUNTER — Other Ambulatory Visit: Payer: Self-pay

## 2018-11-05 ENCOUNTER — Ambulatory Visit: Payer: BLUE CROSS/BLUE SHIELD | Admitting: Pharmacist

## 2018-11-05 VITALS — BP 119/69 | HR 97

## 2018-11-05 DIAGNOSIS — I10 Essential (primary) hypertension: Secondary | ICD-10-CM | POA: Diagnosis not present

## 2018-11-05 NOTE — Progress Notes (Signed)
   S:    PCP: Zelda  Patient arrives in good spirits. Presents to the clinic for hypertension management. Patient was referred by Zelda on 10/22/2018. BP 151/76 at that visit - amlodipine added to regimen.   Patient reports adherence with medications. Denies chest pain, dyspnea, HA, or blurred vision.   Current BP Medications include:  Amlodipine 5 mg daily, HCTZ 25 mg daily  Dietary habits include: limits salt but drinks coffee & soda throughout the day Exercise habits include: walks daily; works in a [employer]/video / Social history:  - FHx: no pertinent positives - Tobacco: never smoker - Alcohol: rarely  Home BP readings: has not checked since addition of amlodipine  O:  L arm after 5 minutes rest: 119/69, HR 97  Last 3 Office BP readings: BP Readings from Last 3 Encounters:  10/22/18 (!) 151/76  06/23/18 (!) 150/90  06/23/18 (!) 144/89   BMET    Component Value Date/Time   NA 144 10/22/2018 1508   K 3.8 10/22/2018 1508   CL 104 10/22/2018 1508   CO2 25 10/22/2018 1508   GLUCOSE 91 10/22/2018 1508   GLUCOSE 97 03/10/2018 1503   BUN 14 10/22/2018 1508   CREATININE 0.80 10/22/2018 1508   CREATININE 0.92 03/10/2018 1503   CREATININE 1.30 (H) 09/20/2016 1440   CALCIUM 9.2 10/22/2018 1508   GFRNONAA 95 10/22/2018 1508   GFRNONAA >60 03/10/2018 1503   GFRNONAA 84 08/10/2014 1023   GFRAA 110 10/22/2018 1508   GFRAA >60 03/10/2018 1503   GFRAA >89 08/10/2014 1023    Renal function: CrCl cannot be calculated (Unknown ideal weight.).  Clinical ASCVD: No  The ASCVD Risk score Mikey Bussing DC Jr., et al., 2013) failed to calculate for the following reasons:   The valid total cholesterol range is 130 to 320 mg/dL  A/P: Hypertension longstanding currently controlled on current medications. BP Goal <130/80 mmHg. Patient is adherent with current medications.  -Continued regimen.  -Emphasized importance of taking medications at the same time every day  -Counseled on lifestyle  modifications for blood pressure control including reduced dietary sodium, increased exercise, adequate sleep -HM: UTD on vaccines  Results reviewed and written information provided.   Total time in face-to-face counseling 15 minutes.   F/U Clinic Visit in July with PCP.    Benard Halsted, PharmD, [place] [phone]

## 2018-11-05 NOTE — Patient Instructions (Signed)

## 2018-11-06 ENCOUNTER — Encounter: Payer: Self-pay | Admitting: Pharmacist

## 2018-11-07 ENCOUNTER — Telehealth (INDEPENDENT_AMBULATORY_CARE_PROVIDER_SITE_OTHER): Payer: Self-pay | Admitting: Radiology

## 2018-11-07 NOTE — Telephone Encounter (Signed)
I called patient and reminded him of his appointment on 11/10/2018. Patient answered "No" to all COVID-19 questions.

## 2018-11-10 ENCOUNTER — Other Ambulatory Visit: Payer: Self-pay

## 2018-11-10 ENCOUNTER — Encounter (INDEPENDENT_AMBULATORY_CARE_PROVIDER_SITE_OTHER): Payer: Self-pay | Admitting: Physician Assistant

## 2018-11-10 ENCOUNTER — Ambulatory Visit (INDEPENDENT_AMBULATORY_CARE_PROVIDER_SITE_OTHER): Payer: BLUE CROSS/BLUE SHIELD | Admitting: Physician Assistant

## 2018-11-10 VITALS — Ht 74.0 in | Wt 206.0 lb

## 2018-11-10 DIAGNOSIS — S98132D Complete traumatic amputation of one left lesser toe, subsequent encounter: Secondary | ICD-10-CM

## 2018-11-10 DIAGNOSIS — L97521 Non-pressure chronic ulcer of other part of left foot limited to breakdown of skin: Secondary | ICD-10-CM

## 2018-11-10 DIAGNOSIS — S98132A Complete traumatic amputation of one left lesser toe, initial encounter: Secondary | ICD-10-CM

## 2018-11-10 NOTE — Progress Notes (Signed)
Office Visit Note   Patient: Stephen Whitaker           Date of Birth: 21-Nov-1955           MRN: [id number] Visit Date: 11/10/2018              Requested by: [name], NP [address] [place], [place] [address] PCP: [name], NP  Chief Complaint  Patient presents with  . Left Foot - Pain      HPI: The patient is a 63 year old gentleman who is seen for follow-up of ulcerations at the base of the fourth and fifth toes of his left foot.  He has been on doxycycline 100 mg p.o. twice daily and utilizing some Iodosorb to the areas and they are drying out nicely.  He is wearing his regular shoes and full weightbearing as tolerated.  He has had previous amputations of the left first through third toes.  Assessment & Plan: Visit Diagnoses:  1. Non-pressure chronic ulcer of other part of left foot limited to breakdown of skin ([place])   2. Amputated toe of left foot ([place])     Plan: Continue iodosorb ointment to the left foot. Vive sock to the left foot around the clock.  He will follow-up in 2 weeks for recheck.  Follow-Up Instructions: Return in about 2 weeks (around 11/24/2018).   Ortho Exam  Patient is alert, oriented, no adenopathy, well-dressed, normal affect, normal respiratory effort. The ulcerations at the base of the fourth toe of the left foot have completely resolved.  There is some residual shallow ulcer between the fourth and fifth toes in the webspace.  There is scant drainage and it does appear to be drying up.  There is no residual calluses over the area.  There is no signs of cellulitis or infection.  He has good palpable dorsalis pedis pulse.  Imaging: No results found. No images are attached to the encounter.  Labs: Lab Results  Component Value Date   HGBA1C 5.6 08/10/2014   ESRSEDRATE 27 (H) 07/05/2015   ESRSEDRATE 80 (H) 01/21/2015   CRP 1.8 (H) 07/05/2015   LABURIC 5.4 02/19/2018   LABURIC 6.0 08/10/2014   REPTSTATUS 01/22/2015 FINAL  01/21/2015   CULT NO GROWTH Performed at [facility]  01/21/2015     Lab Results  Component Value Date   ALBUMIN 3.9 10/22/2018   ALBUMIN 4.1 06/23/2018   ALBUMIN 3.7 03/10/2018   LABURIC 5.4 02/19/2018   LABURIC 6.0 08/10/2014    Body mass index is 26.45 kg/m.  Orders:  No orders of the defined types were placed in this encounter.  No orders of the defined types were placed in this encounter.    Procedures: No procedures performed  Clinical Data: No additional findings.  ROS:  All other systems negative, except as noted in the HPI. Review of Systems  Objective: Vital Signs: Ht 6\' 2"  (1.88 m)   Wt 206 lb (93.4 kg)   BMI 26.45 kg/m   Specialty Comments:  No specialty comments available.  PMFS History: Patient Active Problem List   Diagnosis Date Noted  . Acquired absence of other right toe(s) ([place]) 02/19/2018  . Boil of buttock 10/24/2016  . Amputated toe of left foot ([place]) 10/19/2016  . Amputated toe, right ([place]) 06/13/2016  . Chronic bronchitis ([place]) 11/24/2015  . Other pancytopenia ([place]) 01/24/2015  . Chronic pancreatitis ([place]) 01/24/2015  . Blood poisoning   . Neutropenia ([place])   . Iron  deficiency anemia due to chronic blood loss   . Leukopenia   . Sepsis ([place]) 01/21/2015  . Leucopenia 01/21/2015  . Anemia 01/21/2015  . Syncope 01/21/2015  . Diarrhea 01/21/2015  . Hypokalemia 01/21/2015  . Loss of consciousness ([place])   . Eczema 10/21/2014  . Essential hypertension 10/21/2014  . Vasovagal syncope 10/21/2014  . Bilateral conjunctivitis 08/10/2014  . Other chronic pancreatitis ([place]) 08/10/2014  . COLD (chronic obstructive lung disease) ([place]) 08/10/2014  . Chronic ethmoidal sinusitis 08/10/2014  . Bronchitis 10/17/2010  . PLANTAR FASCIITIS, LEFT 10/17/2010  . OTHER NEUTROPENIA 04/21/2010  . CALLUS, TOE 03/15/2010  . LEUKOPENIA, MILD 02/20/2010  . LIVER FUNCTION TESTS, ABNORMAL, HX OF 02/16/2010  . DENTAL CARIES 02/15/2010  .  ONYCHOMYCOSIS 07/05/2009  . ALLERGIC URTICARIA 07/05/2009  . PAIN IN LIMB 07/05/2009  . ERECTILE DYSFUNCTION 05/25/2008  . INFLUENZA 05/25/2008  . VIRAL INFECTION 10/01/2007  . HSV 05/02/2007  . CONDYLOMA ACUMINATUM 05/02/2007  . HYPERTENSION 05/02/2007  . ALLERGIC RHINITIS 05/02/2007  . Chronic asthma, mild persistent, uncomplicated 78/58/8502  . Gastroesophageal reflux disease without esophagitis 05/02/2007  . PANCREATITIS 05/02/2007   Past Medical History:  Diagnosis Date  . Arthritis   . Boil of buttock   . Chronic bronchitis ([place])   . COPD (chronic obstructive pulmonary disease) (HCC)    Chronic Bronchitis.Advair inhaler   . Dyspnea   . GERD (gastroesophageal reflux disease)    takes Protonix daily  . Gout    takes Allopurinol daily  . Hemorrhoids   . Hypertension    takes HCTZ daily  . Pancreatitis    10 years ago  . Pneumonia 2016  . Tubular adenoma of colon     Family History  Adopted: Yes  Problem Relation Age of Onset  . Cancer Sister 49       unknown origin     Past Surgical History:  Procedure Laterality Date  . AMPUTATION Left 07/14/2014   Procedure: 2nd Toe Amputation;  Surgeon: [name], MD;  Location: [facility];  Service: Orthopedics;  Laterality: Left;  . AMPUTATION Left 11/16/2015   Procedure: Left Great Toe Amputation at Metatarsophalangeal Joint;  Surgeon: [name], MD;  Location: [facility];  Service: Orthopedics;  Laterality: Left;  . AMPUTATION Right 06/13/2016   Procedure: RIGHT 2nd Toe Amputation;  Surgeon: [name], MD;  Location: [facility];  Service: Orthopedics;  Laterality: Right;  . AMPUTATION Left 10/19/2016   Procedure: Amputation Left 3rd Toe;  Surgeon: [name], MD;  Location: [facility];  Service: Orthopedics;  Laterality: Left;  . TOE AMPUTATION Left 11/16/2015   left great toe   . TOE AMPUTATION Right 06/13/2016  . TONSILLECTOMY     Social History   Occupational History  . Not on file  Tobacco Use  . Smoking status:  Never Smoker  . Smokeless tobacco: Never Used  Substance and Sexual Activity  . Alcohol use: Yes    Alcohol/week: 0.0 standard drinks    Comment: rarely  . Drug use: No  . Sexual activity: Not Currently

## 2018-11-21 ENCOUNTER — Other Ambulatory Visit: Payer: Self-pay | Admitting: Nurse Practitioner

## 2018-11-21 DIAGNOSIS — M1A09X0 Idiopathic chronic gout, multiple sites, without tophus (tophi): Secondary | ICD-10-CM

## 2018-11-24 ENCOUNTER — Ambulatory Visit (INDEPENDENT_AMBULATORY_CARE_PROVIDER_SITE_OTHER): Payer: Self-pay | Admitting: Orthopedic Surgery

## 2018-11-24 ENCOUNTER — Ambulatory Visit (INDEPENDENT_AMBULATORY_CARE_PROVIDER_SITE_OTHER): Payer: BLUE CROSS/BLUE SHIELD | Admitting: Physician Assistant

## 2018-12-15 ENCOUNTER — Telehealth: Payer: Self-pay | Admitting: Internal Medicine

## 2018-12-15 NOTE — Telephone Encounter (Signed)
ATC patient went straight to VM. [email or web address]

## 2018-12-16 MED ORDER — BUDESONIDE-FORMOTEROL FUMARATE 160-4.5 MCG/ACT IN AERO: 2.0000 | INHALATION_SPRAY | Freq: Two times a day (BID) | RESPIRATORY_TRACT | 0 refills | Status: DC

## 2018-12-16 NOTE — Telephone Encounter (Signed)
Patient is returning phone call.  Patient phone number is [phone] c or [phone] c.

## 2018-12-16 NOTE — Telephone Encounter (Signed)
Called and spoke with Patient.  Patient requested Symbicort 160 samples.  Samples available and placed at front foyer for Patient to pick up.  Understanding stated.  Nothing further at this time.

## 2018-12-16 NOTE — Telephone Encounter (Signed)
ATC patient.  Left message to call back. 

## 2018-12-18 ENCOUNTER — Ambulatory Visit (HOSPITAL_COMMUNITY)
Admission: EM | Admit: 2018-12-18 | Discharge: 2018-12-18 | Disposition: A | Discharge status: Home / Self Care | Payer: BLUE CROSS/BLUE SHIELD | Attending: Family Medicine | Admitting: Family Medicine

## 2018-12-18 ENCOUNTER — Encounter (HOSPITAL_COMMUNITY): Payer: Self-pay | Admitting: Emergency Medicine

## 2018-12-18 ENCOUNTER — Other Ambulatory Visit: Payer: Self-pay

## 2018-12-18 DIAGNOSIS — J069 Acute upper respiratory infection, unspecified: Secondary | ICD-10-CM | POA: Diagnosis not present

## 2018-12-18 NOTE — ED Provider Notes (Signed)
[facility]    CSN: [id number] Arrival date & time: 12/18/18  1959     History   Chief Complaint Chief Complaint  Patient presents with  . Wheezing    HPI Stephen Whitaker is a 63 y.o. male.   Here presenting with a 2-day history of fatigue and upper respiratory symptoms.  He has a history of bronchitis.  He feels like he is gotten better over the past day.  Denies any fevers or chills.  Has been taking his inhalers regularly.  Denies any contact with anyone with similar symptoms.  Has been working.  Denies any travel.  HPI  Past Medical History:  Diagnosis Date  . Arthritis   . Boil of buttock   . Chronic bronchitis ([place])   . COPD (chronic obstructive pulmonary disease) (HCC)    Chronic Bronchitis.Advair inhaler   . Dyspnea   . GERD (gastroesophageal reflux disease)    takes Protonix daily  . Gout    takes Allopurinol daily  . Hemorrhoids   . Hypertension    takes HCTZ daily  . Pancreatitis    10 years ago  . Pneumonia 2016  . Tubular adenoma of colon     Patient Active Problem List   Diagnosis Date Noted  . Acquired absence of other right toe(s) ([place]) 02/19/2018  . Boil of buttock 10/24/2016  . Amputated toe of left foot ([place]) 10/19/2016  . Amputated toe, right ([place]) 06/13/2016  . Chronic bronchitis ([place]) 11/24/2015  . Other pancytopenia ([place]) 01/24/2015  . Chronic pancreatitis ([place]) 01/24/2015  . Blood poisoning   . Neutropenia ([place])   . Iron deficiency anemia due to chronic blood loss   . Leukopenia   . Sepsis ([place]) 01/21/2015  . Leucopenia 01/21/2015  . Anemia 01/21/2015  . Syncope 01/21/2015  . Diarrhea 01/21/2015  . Hypokalemia 01/21/2015  . Loss of consciousness ([place])   . Eczema 10/21/2014  . Essential hypertension 10/21/2014  . Vasovagal syncope 10/21/2014  . Bilateral conjunctivitis 08/10/2014  . Other chronic pancreatitis ([place]) 08/10/2014  . COLD (chronic obstructive lung disease) ([place]) 08/10/2014  . Chronic ethmoidal  sinusitis 08/10/2014  . Bronchitis 10/17/2010  . PLANTAR FASCIITIS, LEFT 10/17/2010  . OTHER NEUTROPENIA 04/21/2010  . CALLUS, TOE 03/15/2010  . LEUKOPENIA, MILD 02/20/2010  . LIVER FUNCTION TESTS, ABNORMAL, HX OF 02/16/2010  . DENTAL CARIES 02/15/2010  . ONYCHOMYCOSIS 07/05/2009  . ALLERGIC URTICARIA 07/05/2009  . PAIN IN LIMB 07/05/2009  . ERECTILE DYSFUNCTION 05/25/2008  . INFLUENZA 05/25/2008  . VIRAL INFECTION 10/01/2007  . HSV 05/02/2007  . CONDYLOMA ACUMINATUM 05/02/2007  . HYPERTENSION 05/02/2007  . ALLERGIC RHINITIS 05/02/2007  . Chronic asthma, mild persistent, uncomplicated 76/19/5093  . Gastroesophageal reflux disease without esophagitis 05/02/2007  . PANCREATITIS 05/02/2007    Past Surgical History:  Procedure Laterality Date  . AMPUTATION Left 07/14/2014   Procedure: 2nd Toe Amputation;  Surgeon: [name], MD;  Location: [facility];  Service: Orthopedics;  Laterality: Left;  . AMPUTATION Left 11/16/2015   Procedure: Left Great Toe Amputation at Metatarsophalangeal Joint;  Surgeon: [name], MD;  [facility];  Service: Orthopedics;  Laterality: Left;  . AMPUTATION Right 06/13/2016   Procedure: RIGHT 2nd Toe Amputation;  Surgeon: [name], MD;  Location: [facility];  Service: Orthopedics;  Laterality: Right;  . AMPUTATION Left 10/19/2016   Procedure: Amputation Left 3rd Toe;  Surgeon: [name], MD;  Location: [facility];  Service: Orthopedics;  Laterality: Left;  . TOE  AMPUTATION Left 11/16/2015   left great toe   . TOE AMPUTATION Right 06/13/2016  . TONSILLECTOMY         Home Medications    Prior to Admission medications   Medication Sig Start Date End Date Taking? Authorizing Provider  albuterol (PROAIR HFA) 108 (90 Base) MCG/ACT inhaler Inhale 2 puffs into the lungs every 4 (four) hours as needed for wheezing or shortness of breath. 06/25/18  Yes Tanda Rockers, MD  allopurinol (ZYLOPRIM) 300 MG tablet Take 1 tablet by mouth once daily 11/21/18  Yes  [name], NP  amLODipine (NORVASC) 5 MG tablet Take 1 tablet (5 mg total) by mouth daily. 10/22/18  Yes [name], NP  budesonide-formoterol (SYMBICORT) 160-4.5 MCG/ACT inhaler Inhale 2 puffs into the lungs 2 (two) times daily. 10/23/18  Yes Tanda Rockers, MD  budesonide-formoterol (SYMBICORT) 160-4.5 MCG/ACT inhaler Inhale 2 puffs into the lungs 2 (two) times daily. 12/16/18  Yes Tanda Rockers, MD  fluticasone (FLONASE) 50 MCG/ACT nasal spray Place 1 spray into both nostrils daily. 02/19/18  Yes [name], NP  hydrochlorothiazide (HYDRODIURIL) 25 MG tablet Take 1 tablet (25 mg total) by mouth daily. 02/19/18  Yes [name], NP  ibuprofen (ADVIL) 200 MG tablet Take 200 mg by mouth every 6 (six) hours as needed.   Yes [provider]  pantoprazole (PROTONIX) 40 MG tablet Take 30- 60 min before your first and last meals of the day 05/27/18  Yes Tanda Rockers, MD  Bioflavonoid Products (ESTER-C) TABS Take 1 tablet by mouth daily.    [provider]  COLCRYS 0.6 MG tablet TAKE ONE TABLET BY MOUTH ONCE DAILY TO TWICE DAILY AS NEEDED FOR  GOUT 05/22/17   Tresa Garter, MD  CREON 24000-76000 units CPEP TAKE 1 CAPSULE BY MOUTH THREE TIMES DAILY WITH MEALS 10/13/18   [name], NP  doxycycline (VIBRAMYCIN) 100 MG capsule Take 1 capsule (100 mg total) by mouth 2 (two) times daily. 10/14/18   Rayburn, Neta Mends, PA-C  sildenafil (VIAGRA) 50 MG tablet Take 1 tablet (50 mg total) by mouth daily as needed for erectile dysfunction. 03/21/18   [name], NP  SPIRULINA PO Take 1 capsule by mouth 3 (three) times daily.    [provider]    Family History Family History  Adopted: Yes  Problem Relation Age of Onset  . Cancer Sister 72       unknown origin     Social History Social History   Tobacco Use  . Smoking status: Never Smoker  . Smokeless tobacco: Never Used  Substance Use Topics  . Alcohol use: Yes    Alcohol/week: 0.0  standard drinks    Comment: rarely  . Drug use: No     Allergies   Ciprofloxacin; Shellfish allergy; and Sulfa antibiotics   Review of Systems Review of Systems  Constitutional: Positive for activity change.  HENT: Negative for congestion.   Respiratory: Positive for cough.   Cardiovascular: Negative for chest pain.  Gastrointestinal: Negative for abdominal pain.  Musculoskeletal: Negative for gait problem.  Skin: Negative for color change.  Neurological: Negative for weakness.  Hematological: Negative for adenopathy.     Physical Exam Triage Vital Signs ED Triage Vitals  Enc Vitals Group     BP      Pulse      Resp      Temp      Temp src  SpO2      Weight      Height      Head Circumference      Peak Flow      Pain Score      Pain Loc      Pain Edu?      Excl. in [place]?    No data found.  Updated Vital Signs BP (!) 164/97 (BP Location: Right Arm)   Pulse (!) 105   Temp 98.5 F (36.9 C) (Oral)   Resp 20   SpO2 97%   Visual Acuity Right Eye Distance:   Left Eye Distance:   Bilateral Distance:    Right Eye Near:   Left Eye Near:    Bilateral Near:     Physical Exam Gen: NAD, alert, cooperative with exam,  ENT: normal lips, normal nasal mucosa, tympanic membranes clear and intact bilaterally, normal oropharynx, no cervical lymphadenopathy Eye: normal EOM, normal conjunctiva and lids CV:  no edema, +2 pedal pulses, regular  rhythm, S1-S2   Resp: no accessory muscle use, non-labored, clear to auscultation bilaterally, no crackles or wheezes Skin: no rashes, no areas of induration  Neuro: normal tone, normal sensation to touch Psych:  normal insight, alert and oriented MSK: Normal gait, normal strength    UC Treatments / Results  Labs (all labs ordered are listed, but only abnormal results are displayed) Labs Reviewed - No data to display  EKG None  Radiology No results found.  Procedures Procedures (including critical care time)   Medications Ordered in UC Medications - No data to display  Initial Impression / Assessment and Plan / UC Course  I have reviewed the triage vital signs and the nursing notes.  Pertinent labs & imaging results that were available during my care of the patient were reviewed by me and considered in my medical decision making (see chart for details).     [place] is a 63 year old male that is presenting with an upper respiratory infection.  No wheezing on exam.  Appears to be well on exam.  Reports he has been taking his medication.  Advised that he continue to do his regular medications.  Counseled on supportive care.  Given indications to follow-up.    Final Clinical Impressions(s) / UC Diagnoses   Final diagnoses:  Upper respiratory tract infection, unspecified type     Discharge Instructions     Please continue your medications.  Please use a humidifier  Please follow up if your symptom fail to improve.     ED Prescriptions    None     Controlled Substance Prescriptions [place] Controlled Substance Registry consulted? Not Applicable   Rosemarie Ax, MD 12/18/18 2044

## 2018-12-18 NOTE — ED Triage Notes (Signed)
Wheezing and feeling sluggish yesterday, feeling better today.    Patient has a cough, started yesterday Non-productive cough Patient denies fever.   Denies sore throat.

## 2018-12-18 NOTE — Discharge Instructions (Signed)
Please continue your medications.  Please use a humidifier  Please follow up if your symptom fail to improve.

## 2019-01-14 ENCOUNTER — Encounter: Payer: Self-pay | Admitting: Nurse Practitioner

## 2019-01-14 ENCOUNTER — Ambulatory Visit (HOSPITAL_BASED_OUTPATIENT_CLINIC_OR_DEPARTMENT_OTHER): Payer: BLUE CROSS/BLUE SHIELD | Admitting: Nurse Practitioner

## 2019-01-14 ENCOUNTER — Ambulatory Visit: Payer: BLUE CROSS/BLUE SHIELD | Attending: Nurse Practitioner | Admitting: Emergency Medicine

## 2019-01-14 ENCOUNTER — Other Ambulatory Visit: Payer: Self-pay

## 2019-01-14 VITALS — BP 132/79 | HR 99 | Temp 98.4°F | Resp 18 | Ht 74.5 in | Wt 198.8 lb

## 2019-01-14 DIAGNOSIS — R200 Anesthesia of skin: Secondary | ICD-10-CM

## 2019-01-14 DIAGNOSIS — R0789 Other chest pain: Secondary | ICD-10-CM | POA: Diagnosis not present

## 2019-01-14 DIAGNOSIS — G629 Polyneuropathy, unspecified: Secondary | ICD-10-CM

## 2019-01-14 DIAGNOSIS — R202 Paresthesia of skin: Secondary | ICD-10-CM | POA: Diagnosis not present

## 2019-01-14 DIAGNOSIS — I10 Essential (primary) hypertension: Secondary | ICD-10-CM

## 2019-01-14 DIAGNOSIS — G6289 Other specified polyneuropathies: Secondary | ICD-10-CM | POA: Diagnosis not present

## 2019-01-14 NOTE — Progress Notes (Signed)
Patient arrived ambulatory, alert and orientated to clinic.  Patient is in clinic for a blood pressure check.  Patient arrived wanting his blood pressure check as the normal establishment that the patient uses, the machines where not in operation.  Patient states he was having episodes of dizziness.  COVID-19 questions were asked , to which the patient denied any symptoms.  Patient is endorsing intermittent tingling in his feet and hands.  Patient endorses right foot pain from gout.    Blood pressure was taken in both arms and at different times.  Pressures were noted and a appointment was made to talk to a provider today.   Patient informed as to such.  Patient acknowledged understanding of advice.

## 2019-01-14 NOTE — Progress Notes (Signed)
Virtual Visit via Telephone Note Due to national recommendations of social distancing due to [place] 19, telehealth visit is felt to be most appropriate for this patient at this time.  I discussed the limitations, risks, security and privacy concerns of performing an evaluation and management service by telephone and the availability of in person appointments. I also discussed with the patient that there may be a patient responsible charge related to this service. The patient expressed understanding and agreed to proceed.    I connected with Stephen Whitaker on 01/17/19  at   2:50 PM EDT  EDT by telephone and verified that I am speaking with the correct person using two identifiers.   Consent I discussed the limitations, risks, security and privacy concerns of performing an evaluation and management service by telephone and the availability of in person appointments. I also discussed with the patient that there may be a patient responsible charge related to this service. The patient expressed understanding and agreed to proceed.   Location of Patient: Private Residence   Location of Provider: [facility] [employer] Office    Persons participating in Telemedicine visit: [name] FNP-BC [place]    History of Present Illness:  Telemedicine visit for:  Elevated BP  HTN Patient thought his blood pressure was high however once it was checked here in the office he was found to be normotensive.  BP Readings from Last 3 Encounters:  01/14/19 132/79  12/18/18 (!) 164/97  11/05/18 119/69   He states he had a sudden onset of difficulty breathing and tightness on the left side of his chest while at work yesterday. Other symptoms he describes were "I was moving slower and it felt like my left hand was tight". He denies any problems with vision, speech or hearing. Endorses mild numbness and tingling in his left foot and persistent aching and tightness in his  left hand however all other symptoms have resolved at this time. I have instructed him to go to the emergency room if he experiences any sudden onset of severe chest pain or pressure, paralysis or paresis, weakness, dysarthria or severe headache. As he is currently not experiencing any acute CVA symptoms. He has a history of multiple toe amputations of the left foot.     Past Medical History:  Diagnosis Date  . Arthritis   . Boil of buttock   . Chronic bronchitis ([place])   . COPD (chronic obstructive pulmonary disease) (HCC)    Chronic Bronchitis.Advair inhaler   . Dyspnea   . GERD (gastroesophageal reflux disease)    takes Protonix daily  . Gout    takes Allopurinol daily  . Hemorrhoids   . Hypertension    takes HCTZ daily  . Pancreatitis    10 years ago  . Pneumonia 2016  . Tubular adenoma of colon     Past Surgical History:  Procedure Laterality Date  . AMPUTATION Left 07/14/2014   Procedure: 2nd Toe Amputation;  Surgeon: [name], MD;  Location: [facility];  Service: Orthopedics;  Laterality: Left;  . AMPUTATION Left 11/16/2015   Procedure: Left Great Toe Amputation at Metatarsophalangeal Joint;  Surgeon: [name], MD;  Location: [facility];  Service: Orthopedics;  Laterality: Left;  . AMPUTATION Right 06/13/2016   Procedure: RIGHT 2nd Toe Amputation;  Surgeon: [name], MD;  Location: [facility];  Service: Orthopedics;  Laterality: Right;  . AMPUTATION Left 10/19/2016   Procedure: Amputation Left 3rd Toe;  Surgeon: [name], MD;  Location: [facility];  Service: Orthopedics;  Laterality: Left;  . TOE AMPUTATION Left 11/16/2015   left great toe   . TOE AMPUTATION Right 06/13/2016  . TONSILLECTOMY      Family History  Adopted: Yes  Problem Relation Age of Onset  . Cancer Sister 67       unknown origin     Social History   Socioeconomic History  . Marital status: Single    Spouse name: Not on file  . Number of children: 1  . Years of education:    . Highest education  level: Not on file  Occupational History  . Not on file  Social Needs  . Financial resource strain: Not on file  . Food insecurity:    Worry: Not on file    Inability: Not on file  . Transportation needs:    Medical: Not on file    Non-medical: Not on file  Tobacco Use  . Smoking status: Never Smoker  . Smokeless tobacco: Never Used  Substance and Sexual Activity  . Alcohol use: Yes    Alcohol/week: 0.0 standard drinks    Comment: rarely  . Drug use: No  . Sexual activity: Not Currently  Lifestyle  . Physical activity:    Days per week: Not on file    Minutes per session: Not on file  . Stress: Not on file  Relationships  . Social connections:    Talks on phone: Not on file    Gets together: Not on file    Attends religious service: Not on file    Active member of club or organization: Not on file    Attends meetings of clubs or organizations: Not on file    Relationship status: Not on file  Other Topics Concern  . Not on file  Social History Narrative  . Not on file     Observations/Objective: Awake, alert and oriented x 3   Review of Systems  Constitutional: Negative for fever, malaise/fatigue and weight loss.  HENT: Negative.  Negative for nosebleeds.   Eyes: Negative.  Negative for blurred vision, double vision and photophobia.  Respiratory: Negative.  Negative for cough and shortness of breath.   Cardiovascular: Negative.  Negative for chest pain, palpitations and leg swelling.  Gastrointestinal: Negative.  Negative for heartburn, nausea and vomiting.  Musculoskeletal: Negative for myalgias.       SEE HPi  Neurological: Positive for tingling and sensory change. Negative for dizziness, focal weakness, seizures and headaches.  Psychiatric/Behavioral: Negative.  Negative for suicidal ideas.    Assessment and Plan: [name] was seen today for extremity weakness.  Diagnoses and all orders for this visit:  Neuropathy Declines gabapentin States symptoms are  mild and improving  Numbness and tingling of foot Patient declines gabapentin today     Follow Up Instructions Return in about 2 months (around 03/16/2019).     I discussed the assessment and treatment plan with the patient. The patient was provided an opportunity to ask questions and all were answered. The patient agreed with the plan and demonstrated an understanding of the instructions.   The patient was advised to call back or seek an in-person evaluation if the symptoms worsen or if the condition fails to improve as anticipated.  I provided 16 minutes of non-face-to-face time during this encounter including median intraservice time, reviewing previous notes, labs, imaging, medications and explaining diagnosis and management.  [name], FNP-BC

## 2019-01-15 ENCOUNTER — Encounter: Payer: Self-pay | Admitting: Nurse Practitioner

## 2019-01-17 ENCOUNTER — Encounter: Payer: Self-pay | Admitting: Nurse Practitioner

## 2019-01-27 ENCOUNTER — Other Ambulatory Visit: Payer: Self-pay | Admitting: Pediatric Intensive Care

## 2019-01-27 DIAGNOSIS — Z20822 Contact with and (suspected) exposure to covid-19: Secondary | ICD-10-CM

## 2019-01-27 NOTE — Progress Notes (Signed)
lab

## 2019-01-28 ENCOUNTER — Telehealth: Payer: Self-pay | Admitting: Internal Medicine

## 2019-01-28 MED ORDER — BUDESONIDE-FORMOTEROL FUMARATE 160-4.5 MCG/ACT IN AERO: 2.0000 | INHALATION_SPRAY | Freq: Two times a day (BID) | RESPIRATORY_TRACT | 0 refills | Status: DC

## 2019-01-28 NOTE — Telephone Encounter (Signed)
Called & spoke w/ pt regarding his request for Symbicort 160 samples. Samples available and placed at front foyer for pt to pick up. Pt verbalized understanding.  Since it has been roughly 6 mo since pt's LOV, I suggested to pt we create a f/u visit w/ MW. Pt agreed to setting up appt for Friday 02/06/2019 at 10:15 AM. I let pt know to bring in all of his medications to his visit. Pt expressed understanding.   Samples have been placed up front. Appt has been scheduled. Nothing further needed at this time.

## 2019-01-29 LAB — NOVEL CORONAVIRUS, NAA: SARS-CoV-2, NAA: NOT DETECTED

## 2019-02-02 ENCOUNTER — Other Ambulatory Visit: Payer: Self-pay | Admitting: Nurse Practitioner

## 2019-02-02 DIAGNOSIS — M1A09X0 Idiopathic chronic gout, multiple sites, without tophus (tophi): Secondary | ICD-10-CM

## 2019-02-06 ENCOUNTER — Encounter: Payer: Self-pay | Admitting: Internal Medicine

## 2019-02-06 ENCOUNTER — Ambulatory Visit (INDEPENDENT_AMBULATORY_CARE_PROVIDER_SITE_OTHER): Payer: BC Managed Care – PPO | Admitting: Internal Medicine

## 2019-02-06 DIAGNOSIS — J4530 Mild persistent asthma, uncomplicated: Secondary | ICD-10-CM | POA: Diagnosis not present

## 2019-02-06 MED ORDER — MONTELUKAST SODIUM 10 MG PO TABS: 10.0000 mg | ORAL_TABLET | Freq: Every day | ORAL | 11 refills | Status: DC

## 2019-02-06 MED ORDER — MOMETASONE FURO-FORMOTEROL FUM 200-5 MCG/ACT IN AERO: 2.0000 | INHALATION_SPRAY | Freq: Two times a day (BID) | RESPIRATORY_TRACT | 0 refills | Status: DC

## 2019-02-06 MED ORDER — MOMETASONE FURO-FORMOTEROL FUM 200-5 MCG/ACT IN AERO: INHALATION_SPRAY | RESPIRATORY_TRACT | 11 refills | Status: DC

## 2019-02-06 NOTE — Progress Notes (Signed)
Stephen Whitaker, male    DOB: 08-19-1956     MRN: [id number]     Brief patient profile:  29  yobm never smoker dx as childhood asthma/ rhinitis with chronic symptoms preventing nl activities much better p age 63 then moved to college at [employer] age 63 more symptoms on some form of daily rx much better on advair and daily albterol  but still bothered by year round rhinitis and then when advair ran out breathing worse then changed to  symbicort and better on symbicort but still using saba bid and referred to pulmonary clinic 05/26/2018 by [name]    History of Present Illness  05/26/2018  Pulmonary / new pt eval / Keiarah Orlowski  Chief Complaint  Patient presents with  . Pulmonary Consult    Referred by [name], NP for eval of bronchitis. Pt states he has had issues with bronchitis all of his life. He states he is not currently bothered by a cough or any trouble with breathing.   Dyspnea:  MMRC1 = can walk nl pace, flat grade, can't hurry or go uphills or steps s sob   Cough: none at present though some sense of pnds  Sleep: on side flat bed/ 1-2 pillows SABA use: ventolin avg   twice daily on or off advair or symbicort  rec Plan A = Automatic = Continue Symbicort 160 Take 2 puffs first thing in am and then another 2 puffs about 12 hours later.  Work on inhaler technique:    Plan B = Backup Only use your albuterol (VENTOLIN)   Increase Protonix to Take 30- 60 min before your first and last meals of the day  GERD diet           06/23/2018  f/u ov/Mylene Bow re: asthma/ rhinitis with marked atopy / difficulty affording meds despite insurance / did not bring in meds or formulary  Chief Complaint  Patient presents with  . Follow-up    Breathing is doing better. He is using his rescue inhaler 1-2 x per wk on average.   Dyspnea:  Not limited by breathing from desired activities   Cough: none but some sense of pnds day > noct with globus sensation  Sleeping: flat one pillow SABA use: as above  - much less since started symb 160 vs advair prior  rec Plan A = Automatic = Symbicort 160 Take 2 puffs first thing in am and then another 2 puffs about 12 hours later.  Plan B = Backup Only use your albuterol as a rescue medication  If you cannot access the symbicort then return here in 2 weeks with your drug  formulary in hand (this the information)   to see me or one of my nurse practioners   If happy we can see you in 3 months  Late add:  Change proair respiclick to hfa (inadvertantly called in the dpi form which is likely to irrritate his throat)    02/06/2019  f/u ov/Shontel Santee re: asthma/ rhinitis  Chief Complaint  Patient presents with  . Follow-up    Breathing is doing well. He is using his rescue inhaler on average once per month. +   Dyspnea:   Fine as long as on symb Cough: none/ still nasal drainage/congestion does not recall whether singulair helped  Sleeping: flat / one pillow SABA use: none while on symbicort  - issue is formulary restriction     No obvious day to day or daytime variability or assoc excess/  purulent sputum or mucus plugs or hemoptysis or cp or chest tightness, subjective wheeze or overt sinus or hb symptoms.   Sleeping as above without nocturnal  or early am exacerbation  of respiratory  c/o's or need for noct saba. Also denies any obvious fluctuation of symptoms with weather or environmental changes or other aggravating or alleviating factors except as outlined above   No unusual exposure hx or h/o childhood pna or knowledge of premature birth.  Current Allergies, Complete Past Medical History, Past Surgical History, Family History, and Social History were reviewed in [employer] record.  ROS  The following are not active complaints unless bolded Hoarseness, sore throat, dysphagia, dental problems, itching, sneezing,  nasal congestion or discharge of excess mucus or purulent secretions, ear ache,   fever, chills, sweats, unintended wt  loss or wt gain, classically pleuritic or exertional cp,  orthopnea pnd or arm/hand swelling  or leg swelling, presyncope, palpitations, abdominal pain, anorexia, nausea, vomiting, diarrhea  or change in bowel habits or change in bladder habits, change in stools or change in urine, dysuria, hematuria,  rash, arthralgias, visual complaints, headache, numbness, weakness or ataxia or problems with walking or coordination,  change in mood or  memory.        Current Meds  Medication Sig  . albuterol (PROAIR HFA) 108 (90 Base) MCG/ACT inhaler Inhale 2 puffs into the lungs every 4 (four) hours as needed for wheezing or shortness of breath.  . allopurinol (ZYLOPRIM) 300 MG tablet Take 1 tablet by mouth once daily  . amLODipine (NORVASC) 5 MG tablet Take 1 tablet (5 mg total) by mouth daily.  Marland Kitchen Bioflavonoid Products (ESTER-C) TABS Take 1 tablet by mouth daily.  . budesonide-formoterol (SYMBICORT) 160-4.5 MCG/ACT inhaler Inhale 2 puffs into the lungs 2 (two) times daily.  Marland Kitchen COLCRYS 0.6 MG tablet TAKE ONE TABLET BY MOUTH ONCE DAILY TO TWICE DAILY AS NEEDED FOR  GOUT  . CREON 24000-76000 units CPEP TAKE 1 CAPSULE BY MOUTH THREE TIMES DAILY WITH MEALS  . fluticasone (FLONASE) 50 MCG/ACT nasal spray Place 1 spray into both nostrils daily.  . hydrochlorothiazide (HYDRODIURIL) 25 MG tablet Take 1 tablet (25 mg total) by mouth daily.  Marland Kitchen ibuprofen (ADVIL) 200 MG tablet Take 200 mg by mouth every 6 (six) hours as needed.  . pantoprazole (PROTONIX) 40 MG tablet Take 30- 60 min before your first and last meals of the day  . sildenafil (VIAGRA) 50 MG tablet Take 1 tablet (50 mg total) by mouth daily as needed for erectile dysfunction.  Marland Kitchen SPIRULINA PO Take 1 capsule by mouth 3 (three) times daily.                     Objective:    amb bm nad    02/06/2019       198   06/23/18 200 lb 12.8 oz (91.1 kg)  06/23/18 209 lb (94.8 kg)  05/26/18 196 lb 9.6 oz (89.2 kg)       Vital signs reviewed - Note on  arrival 02 sats  98% on RA      HEENT: nl dentition, turbinates bilaterally, and oropharynx. Nl external ear canals without cough reflex   NECK :  without JVD/Nodes/TM/ nl carotid upstrokes bilaterally   LUNGS: no acc muscle use,  Nl contour chest which is clear to A and P bilaterally without cough on insp or exp maneuvers   CV:  RRR  no s3 or murmur or increase  in P2, and no edema   ABD:  soft and nontender with nl inspiratory excursion in the supine position. No bruits or organomegaly appreciated, bowel sounds nl  MS:  Nl gait/ ext warm without deformities, calf tenderness, cyanosis or clubbing No obvious joint restrictions   SKIN: warm and dry without lesions    NEURO:  alert, approp, nl sensorium with  no motor or cerebellar deficits apparent.        Assessment

## 2019-02-06 NOTE — Patient Instructions (Addendum)
Plan A = Automatic = dulera 200 Take 2 puffs first thing in am and then another 2 puffs about 12 hours later.   Singulair (montelukast) 10 mg one before sleep for nasal symptoms     Plan B = Backup Only use your albuterol inhaler as a rescue medication to be used if you can't catch your breath by resting or doing a relaxed purse lip breathing pattern.  - The less you use it, the better it will work when you need it. - Ok to use the inhaler up to 2 puffs  every 4 hours if you must but call for appointment if use goes up over your usual need - Don't leave home without it !!  (think of it like the spare tire for your car)   Plan C = Crisis - only use your albuterol nebulizer if you first try Plan B and it fails to help > ok to use the nebulizer up to every 4 hours but if start needing it regularly call for immediate appointment   We will call you with referral to allergy    If any trouble with your medications you'll need to bring your Drug Formulary with options for the more expensive medications

## 2019-02-07 ENCOUNTER — Encounter: Payer: Self-pay | Admitting: Internal Medicine

## 2019-02-07 NOTE — Assessment & Plan Note (Addendum)
Onset in childhood Allergy profile 05/26/2018 >  Eos 0.1/  IgE  905  RAST pan pos - Spirometry 05/26/2018  FEV1 2.5 (75%)  Ratio 77 min curvature p am symb 160 x2 puffs  - 05/26/2018   continue symbicort 160 / leave off singulair as says can't afford it on BCBS plan/ submitted [place] and me paperwork for symb - 02/06/2019  After extensive coaching inhaler device,  effectiveness =    90% > continue symbicort 160 (or dulera 200 depending on formulary) added singulair  Trial - referred to allergy 02/06/2019    Other than nasal symptoms which are likely allergic,  As long as has access to symbicort 160 or its equivalent  >  All goals of chronic asthma control met including optimal function and elimination of symptoms with minimal need for rescue therapy.  Contingencies discussed in full including contacting this office immediately if not controlling the symptoms using the rule of two's.     I had an extended discussion with the patient reviewing all relevant studies completed to date and  lasting 15 to 20 minutes of a 25 minute visit    See device teaching which extended face to face time for this visit.  Each maintenance medication was reviewed in detail including emphasizing most importantly the difference between maintenance and prns and under what circumstances the prns are to be triggered using an action plan format that is not reflected in the computer generated alphabetically organized AVS which I have not found useful in most complex patients, especially with respiratory illnesses  Please see AVS for specific instructions unique to this visit that I personally wrote and verbalized to the the pt in detail and then reviewed with pt  by my nurse highlighting any  changes in therapy recommended at today's visit to their plan of care.

## 2019-02-23 ENCOUNTER — Other Ambulatory Visit: Payer: Self-pay | Admitting: Nurse Practitioner

## 2019-02-23 ENCOUNTER — Other Ambulatory Visit (INDEPENDENT_AMBULATORY_CARE_PROVIDER_SITE_OTHER): Payer: Self-pay | Admitting: Orthopedic Surgery

## 2019-02-23 DIAGNOSIS — I10 Essential (primary) hypertension: Secondary | ICD-10-CM

## 2019-03-11 ENCOUNTER — Ambulatory Visit: Payer: BLUE CROSS/BLUE SHIELD | Admitting: Nurse Practitioner

## 2019-03-17 ENCOUNTER — Other Ambulatory Visit: Payer: Self-pay

## 2019-03-17 ENCOUNTER — Ambulatory Visit: Payer: BC Managed Care – PPO | Attending: Nurse Practitioner | Admitting: Nurse Practitioner

## 2019-03-17 NOTE — Progress Notes (Deleted)
Virtual Visit via Telephone Note Due to national recommendations of social distancing due to [place] 19, telehealth visit is felt to be most appropriate for this patient at this time.  I discussed the limitations, risks, security and privacy concerns of performing an evaluation and management service by telephone and the availability of in person appointments. I also discussed with the patient that there may be a patient responsible charge related to this service. The patient expressed understanding and agreed to proceed.    I connected with Stephen Whitaker on 03/17/19  at   2:10 PM EDT  EDT by telephone and verified that I am speaking with the correct person using two identifiers.   Consent I discussed the limitations, risks, security and privacy concerns of performing an evaluation and management service by telephone and the availability of in person appointments. I also discussed with the patient that there may be a patient responsible charge related to this service. The patient expressed understanding and agreed to proceed.   Location of Patient: Private Residence   Location of Provider: [facility] Office    Persons participating in Telemedicine visit: [name] FNP-BC [place]    History of Present Illness: Telemedicine visit for: ***    Past Medical History:  Diagnosis Date  . Arthritis   . Boil of buttock   . Chronic bronchitis ([place])   . COPD (chronic obstructive pulmonary disease) (H[facility])    Chronic Bronchitis.Advair inhaler   . Dyspnea   . GERD (gastroesophageal reflux disease)    takes Protonix daily  . Gout    takes Allopurinol daily  . Hemorrhoids   . Hypertension    takes HCTZ daily  . Pancreatitis    10 years ago  . Pneumonia 2016  . Tubular adenoma of colon     Past Surgical History:  Procedure Laterality Date  . AMPUTATION Left 07/14/2014   Procedure: 2nd Toe Amputation;  Surgeon: [name], MD;   Location: [facility];  Service: Orthopedics;  Laterality: Left;  . AMPUTATION Left 11/16/2015   Procedure: Left Great Toe Amputation at Metatarsophalangeal Joint;  Surgeon: [name], MD;  Location: [facility];  Service: Orthopedics;  Laterality: Left;  . AMPUTATION Right 06/13/2016   Procedure: RIGHT 2nd Toe Amputation;  Surgeon: [name], MD;  Location: [facility];  Service: Orthopedics;  Laterality: Right;  . AMPUTATION Left 10/19/2016   Procedure: Amputation Left 3rd Toe;  Surgeon: [name], MD;  Location: [facility];  Service: Orthopedics;  Laterality: Left;  . TOE AMPUTATION Left 11/16/2015   left great toe   . TOE AMPUTATION Right 06/13/2016  . TONSILLECTOMY      Family History  Adopted: Yes  Problem Relation Age of Onset  . Cancer Sister 56       unknown origin     Social History   Socioeconomic History  . Marital status: Single    Spouse name: Not on file  . Number of children: 1  . Years of education:    . Highest education level: Not on file  Occupational History  . Not on file  Social Needs  . Financial resource strain: Not on file  . Food insecurity    Worry: Not on file    Inability: Not on file  . Transportation needs    Medical: Not on file    Non-medical: Not on file  Tobacco Use  . Smoking status: Never Smoker  . Smokeless  tobacco: Never Used  Substance and Sexual Activity  . Alcohol use: Yes    Alcohol/week: 0.0 standard drinks    Comment: rarely  . Drug use: No  . Sexual activity: Not Currently  Lifestyle  . Physical activity    Days per week: Not on file    Minutes per session: Not on file  . Stress: Not on file  Relationships  . Social Herbalist on phone: Not on file    Gets together: Not on file    Attends religious service: Not on file    Active member of club or organization: Not on file    Attends meetings of clubs or organizations: Not on file    Relationship status: Not on file  Other Topics Concern  . Not on file  Social  History Narrative  . Not on file     Observations/Objective: Awake, alert and oriented x 3   ROS  Assessment and Plan: There are no diagnoses linked to this encounter.   Follow Up Instructions No follow-ups on file.     I discussed the assessment and treatment plan with the patient. The patient was provided an opportunity to ask questions and all were answered. The patient agreed with the plan and demonstrated an understanding of the instructions.   The patient was advised to call back or seek an in-person evaluation if the symptoms worsen or if the condition fails to improve as anticipated.  I provided *** minutes of non-face-to-face time during this encounter including median intraservice time, reviewing previous notes, labs, imaging, medications and explaining diagnosis and management.  [name], FNP-BC

## 2019-03-23 ENCOUNTER — Other Ambulatory Visit (INDEPENDENT_AMBULATORY_CARE_PROVIDER_SITE_OTHER): Payer: Self-pay | Admitting: Orthopedic Surgery

## 2019-04-19 ENCOUNTER — Other Ambulatory Visit (INDEPENDENT_AMBULATORY_CARE_PROVIDER_SITE_OTHER): Payer: Self-pay | Admitting: Orthopedic Surgery

## 2019-04-24 ENCOUNTER — Other Ambulatory Visit: Payer: Self-pay | Admitting: Internal Medicine

## 2019-04-24 DIAGNOSIS — K219 Gastro-esophageal reflux disease without esophagitis: Secondary | ICD-10-CM

## 2019-04-28 ENCOUNTER — Encounter: Payer: Self-pay | Admitting: Orthopedic Surgery

## 2019-04-28 ENCOUNTER — Ambulatory Visit (INDEPENDENT_AMBULATORY_CARE_PROVIDER_SITE_OTHER): Payer: BC Managed Care – PPO | Admitting: Orthopedic Surgery

## 2019-04-28 VITALS — Ht 74.0 in | Wt 198.0 lb

## 2019-04-28 DIAGNOSIS — Z89422 Acquired absence of other left toe(s): Secondary | ICD-10-CM

## 2019-04-28 DIAGNOSIS — S98132A Complete traumatic amputation of one left lesser toe, initial encounter: Secondary | ICD-10-CM

## 2019-04-28 DIAGNOSIS — L97521 Non-pressure chronic ulcer of other part of left foot limited to breakdown of skin: Secondary | ICD-10-CM | POA: Diagnosis not present

## 2019-04-28 MED ORDER — IBUPROFEN 800 MG PO TABS: 800.0000 mg | ORAL_TABLET | Freq: Three times a day (TID) | ORAL | 0 refills | Status: DC | PRN

## 2019-04-28 NOTE — Progress Notes (Signed)
Office Visit Note   Patient: Stephen Whitaker           Date of Birth: 10-Sep-1955           MRN: [id number] Visit Date: 04/28/2019              Requested by: [name], NP [address] [place],  [place] [address] PCP: [name], NP  Chief Complaint  Patient presents with  . Left Foot - Wound Check      HPI: Patient is a 63 year old gentleman who states he noticed a new ulcer beneath the first metatarsal head left foot.  He has a history of amputations toes 1 through 3 of the left foot.  Patient is concerned he may developed a new ulcer that extends down to bone.  Assessment & Plan: Visit Diagnoses:  1. Non-pressure chronic ulcer of other part of left foot limited to breakdown of skin ([place])   2. Amputated toe of left foot ([place])     Plan: A refill prescription was given for ibuprofen as requested.  Patient was given instructions to apply a Band-Aid and antibiotic ointment to this wound daily washing with soap and water and minimize weightbearing.  Follow-Up Instructions: Return in about 2 weeks (around 05/12/2019).   Ortho Exam  Patient is alert, oriented, no adenopathy, well-dressed, normal affect, normal respiratory effort. Examination patient has a new blister ulcer beneath the first metatarsal head of the left foot he has dorsiflexion to neutral of the ankle there is no ascending cellulitis.  After informed consent a 10 blade knife was used to debride the skin and soft tissue back to healthy viable granulation tissue the ulcer measures 10 x 30 mm after debridement and is 1 mm deep.  It does not probe to bone or tendon.  Silver nitrate was used for hemostasis a Band-Aid and Bactroban was applied.  Patient was given a few Band-Aids to use at home.  Imaging: No results found. No images are attached to the encounter.  Labs: Lab Results  Component Value Date   HGBA1C 5.6 08/10/2014   ESRSEDRATE 27 (H) 07/05/2015   ESRSEDRATE 80 (H) 01/21/2015   CRP 1.8 (H)  07/05/2015   LABURIC 5.4 02/19/2018   LABURIC 6.0 08/10/2014   REPTSTATUS 01/22/2015 FINAL 01/21/2015   CULT NO GROWTH Performed at [facility]  01/21/2015     Lab Results  Component Value Date   ALBUMIN 3.9 10/22/2018   ALBUMIN 4.1 06/23/2018   ALBUMIN 3.7 03/10/2018   LABURIC 5.4 02/19/2018   LABURIC 6.0 08/10/2014    Lab Results  Component Value Date   MG 1.8 01/21/2015   Lab Results  Component Value Date   VD25OH 32.4 01/23/2017    No results found for: PREALBUMIN CBC EXTENDED Latest Ref Rng & Units 10/22/2018 05/26/2018 03/10/2018  WBC 3.4 - 10.8 x10E3/uL 2.5(LL) 4.1 2.4(L)  RBC 4.14 - 5.80 x10E6/uL 4.30 4.29 4.40  HGB 13.0 - 17.7 g/dL 11.7(L) 12.1(L) 12.3(L)  HCT 37.5 - 51.0 % 36.7(L) 37.6(L) 38.4  PLT 150 - 450 x10E3/uL 219 206.0 205  NEUTROABS 1.4 - 7.7 K/uL - 2.6 1.6  LYMPHSABS 0.7 - 4.0 K/uL - 0.9 0.5(L)     Body mass index is 25.42 kg/m.  Orders:  No orders of the defined types were placed in this encounter.  Meds ordered this encounter  Medications  . ibuprofen (ADVIL) 800 MG tablet    Sig: Take 1 tablet (800 mg total) by  mouth every 8 (eight) hours as needed.    Dispense:  90 tablet    Refill:  0     Procedures: No procedures performed  Clinical Data: No additional findings.  ROS:  All other systems negative, except as noted in the HPI. Review of Systems  Objective: Vital Signs: Ht 6\' 2"  (1.88 m)   Wt 198 lb (89.8 kg)   BMI 25.42 kg/m   Specialty Comments:  No specialty comments available.  PMFS History: Patient Active Problem List   Diagnosis Date Noted  . Acquired absence of other right toe(s) ([place]) 02/19/2018  . Boil of buttock 10/24/2016  . Amputated toe of left foot ([place]) 10/19/2016  . Amputated toe, right ([place]) 06/13/2016  . Chronic bronchitis ([place]) 11/24/2015  . Other pancytopenia ([place]) 01/24/2015  . Chronic pancreatitis ([place]) 01/24/2015  . Blood poisoning   . Neutropenia ([place])   . Iron deficiency anemia  due to chronic blood loss   . Leukopenia   . Sepsis ([place]) 01/21/2015  . Leucopenia 01/21/2015  . Anemia 01/21/2015  . Syncope 01/21/2015  . Diarrhea 01/21/2015  . Hypokalemia 01/21/2015  . Loss of consciousness ([place])   . Eczema 10/21/2014  . Essential hypertension 10/21/2014  . Vasovagal syncope 10/21/2014  . Bilateral conjunctivitis 08/10/2014  . Other chronic pancreatitis ([place]) 08/10/2014  . COLD (chronic obstructive lung disease) ([place]) 08/10/2014  . Chronic ethmoidal sinusitis 08/10/2014  . Bronchitis 10/17/2010  . PLANTAR FASCIITIS, LEFT 10/17/2010  . OTHER NEUTROPENIA 04/21/2010  . CALLUS, TOE 03/15/2010  . LEUKOPENIA, MILD 02/20/2010  . LIVER FUNCTION TESTS, ABNORMAL, HX OF 02/16/2010  . DENTAL CARIES 02/15/2010  . ONYCHOMYCOSIS 07/05/2009  . ALLERGIC URTICARIA 07/05/2009  . PAIN IN LIMB 07/05/2009  . ERECTILE DYSFUNCTION 05/25/2008  . INFLUENZA 05/25/2008  . VIRAL INFECTION 10/01/2007  . HSV 05/02/2007  . CONDYLOMA ACUMINATUM 05/02/2007  . HYPERTENSION 05/02/2007  . ALLERGIC RHINITIS 05/02/2007  . Chronic asthma, mild persistent, uncomplicated [id number]  . Gastroesophageal reflux disease without esophagitis 05/02/2007  . PANCREATITIS 05/02/2007   Past Medical History:  Diagnosis Date  . Arthritis   . Boil of buttock   . Chronic bronchitis ([place])   . COPD (chronic obstructive pulmonary disease) (HCC)    Chronic Bronchitis.Advair inhaler   . Dyspnea   . GERD (gastroesophageal reflux disease)    takes Protonix daily  . Gout    takes Allopurinol daily  . Hemorrhoids   . Hypertension    takes HCTZ daily  . Pancreatitis    10 years ago  . Pneumonia 2016  . Tubular adenoma of colon     Family History  Adopted: Yes  Problem Relation Age of Onset  . Cancer Sister 35       unknown origin     Past Surgical History:  Procedure Laterality Date  . AMPUTATION Left 07/14/2014   Procedure: 2nd Toe Amputation;  Surgeon: [name], MD;  Location: [facility];   Service: Orthopedics;  Laterality: Left;  . AMPUTATION Left 11/16/2015   Procedure: Left Great Toe Amputation at Metatarsophalangeal Joint;  Surgeon: [name], MD;  Location: [facility];  Service: Orthopedics;  Laterality: Left;  . AMPUTATION Right 06/13/2016   Procedure: RIGHT 2nd Toe Amputation;  Surgeon: [name], MD;  Location: [facility];  Service: Orthopedics;  Laterality: Right;  . AMPUTATION Left 10/19/2016   Procedure: Amputation Left 3rd Toe;  Surgeon: [name], MD;  Location: [facility];  Service: Orthopedics;  Laterality: Left;  . TOE  AMPUTATION Left 11/16/2015   left great toe   . TOE AMPUTATION Right 06/13/2016  . TONSILLECTOMY     Social History   Occupational History  . Not on file  Tobacco Use  . Smoking status: Never Smoker  . Smokeless tobacco: Never Used  Substance and Sexual Activity  . Alcohol use: Yes    Alcohol/week: 0.0 standard drinks    Comment: rarely  . Drug use: No  . Sexual activity: Not Currently

## 2019-05-03 ENCOUNTER — Other Ambulatory Visit: Payer: Self-pay | Admitting: Nurse Practitioner

## 2019-05-03 DIAGNOSIS — M1A09X0 Idiopathic chronic gout, multiple sites, without tophus (tophi): Secondary | ICD-10-CM

## 2019-05-06 ENCOUNTER — Telehealth: Payer: Self-pay | Admitting: Internal Medicine

## 2019-05-06 NOTE — Telephone Encounter (Signed)
Patient called and stated that he had refills for Delara but pharmacy stated that it would have to be approved by the insurance. CB is [place]

## 2019-05-06 NOTE — Telephone Encounter (Signed)
Checked last office note and the patient was put on [facility] 02/06/19 with 11 refills. Symbicort was not on list of current medications.  Called the patient to make him aware Symbicort was not on list of medications and he was issued [facility] with an 11 refill prescription. He stated he contacted [facility] about the prescription and was told there were not refills available.   I called [facility] and confirmed the patient did pick up the prescription refills for [facility]. However, a prior authorization is needed. Pharmacy stated it looks like the previous PA submitted had expired.  PA started with Covermymeds.key # [id number].  Message in triage as FYI.

## 2019-05-07 NOTE — Telephone Encounter (Signed)
Checked the status of the PA on covermymeds.com. It appears the PA was cancelled by the insurance for one of the following reasons: duplicate PA on file, PA was not needed, or the patient does not have active coverage.   After I reviewed patient's chart, the PA was sent to the wrong insurance, it needed to be sent to [facility].   [place] for PA is [id number]. PA has been approved until 2023.   Spoke with pharmacist at [facility] but she advised me that the copay was over $300. She reviewed his profile and does not believe he is in the donut hole. She wanted to know if we could call in something more affordable for him.   Left message for patient to call back to see if he can get access to his formulary.

## 2019-05-12 ENCOUNTER — Ambulatory Visit (INDEPENDENT_AMBULATORY_CARE_PROVIDER_SITE_OTHER): Payer: BC Managed Care – PPO | Admitting: Orthopedic Surgery

## 2019-05-12 ENCOUNTER — Encounter: Payer: Self-pay | Admitting: Orthopedic Surgery

## 2019-05-12 ENCOUNTER — Other Ambulatory Visit: Payer: Self-pay

## 2019-05-12 VITALS — Ht 74.0 in | Wt 198.0 lb

## 2019-05-12 DIAGNOSIS — L97521 Non-pressure chronic ulcer of other part of left foot limited to breakdown of skin: Secondary | ICD-10-CM

## 2019-05-12 DIAGNOSIS — Z89422 Acquired absence of other left toe(s): Secondary | ICD-10-CM

## 2019-05-12 DIAGNOSIS — B351 Tinea unguium: Secondary | ICD-10-CM | POA: Diagnosis not present

## 2019-05-12 DIAGNOSIS — S98132A Complete traumatic amputation of one left lesser toe, initial encounter: Secondary | ICD-10-CM

## 2019-05-12 NOTE — Telephone Encounter (Signed)
Spoke with pt and advised him to call us back with his formulary so we can choose a more affordable medication. Pt understood and will await a return call.

## 2019-05-12 NOTE — Progress Notes (Signed)
Office Visit Note   Patient: Stephen Whitaker           Date of Birth: [birth year]           MRN: [id number] Visit Date: 05/12/2019              Requested by: [name], NP [address] [place],  [place] [address] PCP: [name], NP  Chief Complaint  Patient presents with  . Left Foot - Follow-up      HPI: Patient is a 63 year old gentleman who is seen today in follow up for a new ulcer beneath the first metatarsal head left foot.  He has a history of amputations toes 1 through 3 of the left foot.  Has been doing mupirocin dressing changes daily states that he is currently out of dressings.   Assessment & Plan: Visit Diagnoses:  1. ONYCHOMYCOSIS   2. Non-pressure chronic ulcer of other part of left foot limited to breakdown of skin ([place])   3. Amputated toe of left foot ([place])     Plan:  Patient was given instructions to apply a Band-Aid and antibiotic ointment to this wound daily washing with soap and water and minimize weightbearing.  Follow-Up Instructions: Return in about 3 weeks (around 06/02/2019).   Ortho Exam  Patient is alert, oriented, no adenopathy, well-dressed, normal affect, normal respiratory effort. Examination patient has an ulcer beneath the first metatarsal head of the left foot. he has dorsiflexion to neutral of the ankle there is no ascending cellulitis.  After informed consent a 10 blade knife was used to debride the skin and soft tissue back to healthy viable granulation tissue the ulcer measures 10 mm in diameter after debridement and is 1 mm deep.  It does not probe to bone or tendon.  Silver nitrate was used for hemostasis a Band-Aid and Bactroban was applied.  Patient was given a few Band-Aids to use at home.  Imaging: No results found. No images are attached to the encounter.  Labs: Lab Results  Component Value Date   HGBA1C 5.6 08/10/2014   ESRSEDRATE 27 (H) 07/05/2015   ESRSEDRATE 80 (H) 01/21/2015   CRP 1.8 (H) 07/05/2015    LABURIC 5.4 02/19/2018   LABURIC 6.0 08/10/2014   REPTSTATUS 01/22/2015 FINAL 01/21/2015   CULT NO GROWTH Performed at [facility]  01/21/2015     Lab Results  Component Value Date   ALBUMIN 3.9 10/22/2018   ALBUMIN 4.1 06/23/2018   ALBUMIN 3.7 03/10/2018   LABURIC 5.4 02/19/2018   LABURIC 6.0 08/10/2014    Lab Results  Component Value Date   MG 1.8 01/21/2015   Lab Results  Component Value Date   VD25OH 32.4 01/23/2017    No results found for: PREALBUMIN CBC EXTENDED Latest Ref Rng & Units 10/22/2018 05/26/2018 03/10/2018  WBC 3.4 - 10.8 x10E3/uL 2.5(LL) 4.1 2.4(L)  RBC 4.14 - 5.80 x10E6/uL 4.30 4.29 4.40  HGB 13.0 - 17.7 g/dL 11.7(L) 12.1(L) 12.3(L)  HCT 37.5 - 51.0 % 36.7(L) 37.6(L) 38.4  PLT 150 - 450 x10E3/uL 219 206.0 205  NEUTROABS 1.4 - 7.7 K/uL - 2.6 1.6  LYMPHSABS 0.7 - 4.0 K/uL - 0.9 0.5(L)     Body mass index is 25.42 kg/m.  Orders:  No orders of the defined types were placed in this encounter.  No orders of the defined types were placed in this encounter.    Procedures: No procedures performed  Clinical Data: No additional findings.  ROS:  All other systems negative, except as noted in the HPI. Review of Systems  Constitutional: Negative for chills and fever.  Skin: Positive for wound. Negative for color change.    Objective: Vital Signs: Ht 6\' 2"  (1.88 m)   Wt 198 lb (89.8 kg)   BMI 25.42 kg/m   Specialty Comments:  No specialty comments available.  PMFS History: Patient Active Problem List   Diagnosis Date Noted  . Acquired absence of other right toe(s) ([place]) 02/19/2018  . Boil of buttock 10/24/2016  . Amputated toe of left foot ([place]) 10/19/2016  . Amputated toe, right ([place]) 06/13/2016  . Chronic bronchitis ([place]) 11/24/2015  . Other pancytopenia ([place]) 01/24/2015  . Chronic pancreatitis ([place]) 01/24/2015  . Blood poisoning   . Neutropenia ([place])   . Iron deficiency anemia due to chronic blood loss   . Leukopenia    . Sepsis ([place]) 01/21/2015  . Leucopenia 01/21/2015  . Anemia 01/21/2015  . Syncope 01/21/2015  . Diarrhea 01/21/2015  . Hypokalemia 01/21/2015  . Loss of consciousness ([place])   . Eczema 10/21/2014  . Essential hypertension 10/21/2014  . Vasovagal syncope 10/21/2014  . Bilateral conjunctivitis 08/10/2014  . Other chronic pancreatitis ([place]) 08/10/2014  . COLD (chronic obstructive lung disease) ([place]) 08/10/2014  . Chronic ethmoidal sinusitis 08/10/2014  . Bronchitis 10/17/2010  . PLANTAR FASCIITIS, LEFT 10/17/2010  . OTHER NEUTROPENIA 04/21/2010  . CALLUS, TOE 03/15/2010  . LEUKOPENIA, MILD 02/20/2010  . LIVER FUNCTION TESTS, ABNORMAL, HX OF 02/16/2010  . DENTAL CARIES 02/15/2010  . ONYCHOMYCOSIS 07/05/2009  . ALLERGIC URTICARIA 07/05/2009  . PAIN IN LIMB 07/05/2009  . ERECTILE DYSFUNCTION 05/25/2008  . INFLUENZA 05/25/2008  . VIRAL INFECTION 10/01/2007  . HSV 05/02/2007  . CONDYLOMA ACUMINATUM 05/02/2007  . HYPERTENSION 05/02/2007  . ALLERGIC RHINITIS 05/02/2007  . Chronic asthma, mild persistent, uncomplicated Q000111Q  . Gastroesophageal reflux disease without esophagitis 05/02/2007  . PANCREATITIS 05/02/2007   Past Medical History:  Diagnosis Date  . Arthritis   . Boil of buttock   . Chronic bronchitis ([place])   . COPD (chronic obstructive pulmonary disease) ([facility])    Chronic Bronchitis.Advair inhaler   . Dyspnea   . GERD (gastroesophageal reflux disease)    takes Protonix daily  . Gout    takes Allopurinol daily  . Hemorrhoids   . Hypertension    takes HCTZ daily  . Pancreatitis    10 years ago  . Pneumonia 2016  . Tubular adenoma of colon     Family History  Adopted: Yes  Problem Relation Age of Onset  . Cancer Sister 14       unknown origin     Past Surgical History:  Procedure Laterality Date  . AMPUTATION Left 07/14/2014   Procedure: 2nd Toe Amputation;  Surgeon: [name], MD;  Location: [facility];  Service: Orthopedics;  Laterality: Left;  .  AMPUTATION Left 11/16/2015   Procedure: Left Great Toe Amputation at Metatarsophalangeal Joint;  Surgeon: [name], MD;  Location: [facility];  Service: Orthopedics;  Laterality: Left;  . AMPUTATION Right 06/13/2016   Procedure: RIGHT 2nd Toe Amputation;  Surgeon: [name], MD;  Location: [facility];  Service: Orthopedics;  Laterality: Right;  . AMPUTATION Left 10/19/2016   Procedure: Amputation Left 3rd Toe;  Surgeon: [name], MD;  Location: [facility];  Service: Orthopedics;  Laterality: Left;  . TOE AMPUTATION Left 11/16/2015   left great toe   . TOE AMPUTATION Right 06/13/2016  . TONSILLECTOMY  Social History   Occupational History  . Not on file  Tobacco Use  . Smoking status: Never Smoker  . Smokeless tobacco: Never Used  Substance and Sexual Activity  . Alcohol use: Yes    Alcohol/week: 0.0 standard drinks    Comment: rarely  . Drug use: No  . Sexual activity: Not Currently

## 2019-05-13 ENCOUNTER — Telehealth: Payer: Self-pay | Admitting: Internal Medicine

## 2019-05-13 DIAGNOSIS — J4530 Mild persistent asthma, uncomplicated: Secondary | ICD-10-CM

## 2019-05-13 MED ORDER — MOMETASONE FURO-FORMOTEROL FUM 200-5 MCG/ACT IN AERO: 2.0000 | INHALATION_SPRAY | Freq: Two times a day (BID) | RESPIRATORY_TRACT | 0 refills | Status: DC

## 2019-05-13 NOTE — Telephone Encounter (Signed)
Called the patient and advised him his request was received and the list will be sent to [facility]. He did not have the name of a specific department attention it should go to.  I advised the patient that Symbicort is not on his medication list. He asked if we had any Dulera available because the cost is too high when trying to fill at [facility]. Patient said they give you the first 1 or 2 free and then the price goes sky high after that. I suggested to the patient to try patient assistance or the manufacturer website to see if they have a savings card or if he can get help to offset cost.   Patient needs list sent to [facility], they are going to take over sending the medication, it will be cheaper.  I told him I will see if we have a sample of Dulera available and if so will call him.  Patient agreed.  Called patient back, advised sample will be up front for pick up.

## 2019-05-13 NOTE — Telephone Encounter (Signed)
Patient is calling back and wanted to see if he can get some samples for Symbricort until he get medication from [facility]. CB is [phone]

## 2019-05-29 ENCOUNTER — Other Ambulatory Visit: Payer: Self-pay | Admitting: Nurse Practitioner

## 2019-05-29 DIAGNOSIS — I10 Essential (primary) hypertension: Secondary | ICD-10-CM

## 2019-06-04 ENCOUNTER — Other Ambulatory Visit: Payer: Self-pay

## 2019-06-04 ENCOUNTER — Ambulatory Visit (INDEPENDENT_AMBULATORY_CARE_PROVIDER_SITE_OTHER): Payer: BC Managed Care – PPO | Admitting: Orthopedic Surgery

## 2019-06-04 ENCOUNTER — Encounter: Payer: Self-pay | Admitting: Orthopedic Surgery

## 2019-06-04 VITALS — Ht 74.0 in | Wt 198.0 lb

## 2019-06-04 DIAGNOSIS — Z89422 Acquired absence of other left toe(s): Secondary | ICD-10-CM

## 2019-06-04 DIAGNOSIS — L97521 Non-pressure chronic ulcer of other part of left foot limited to breakdown of skin: Secondary | ICD-10-CM

## 2019-06-04 DIAGNOSIS — S98132A Complete traumatic amputation of one left lesser toe, initial encounter: Secondary | ICD-10-CM

## 2019-06-05 ENCOUNTER — Encounter: Payer: Self-pay | Admitting: Orthopedic Surgery

## 2019-06-05 NOTE — Progress Notes (Signed)
Office Visit Note   Patient: Stephen Whitaker           Date of Birth: [birth year]           MRN: [id number] Visit Date: 06/04/2019              Requested by: [name], NP [address] [place],   [address] PCP: [name], NP  Chief Complaint  Patient presents with  . Left Foot - Follow-up      HPI: Patient is a 62 year old gentleman who presents in follow-up for a Wegner grade 1 ulcer plantar aspect left foot status post great toe amputation.  Patient states he wears a Band-Aid does have some drainage he is full weightbearing.  Assessment & Plan: Visit Diagnoses:  1. Non-pressure chronic ulcer of other part of left foot limited to breakdown of skin ([place])   2. Amputated toe of left foot ([place])     Plan: Patient is given some felt relieving donut still on the low pressure from the first metatarsal head recommended minimizing weightbearing.  Follow-Up Instructions: Return in about 4 weeks (around 07/02/2019).   Ortho Exam  Patient is alert, oriented, no adenopathy, well-dressed, normal affect, normal respiratory effort. Examination patient has a strong dorsalis pedis and posterior tibial pulse.  The amputation of the great toe has healed well.  Patient does have a Wegner grade 1 ulcer beneath the first metatarsal head.  After informed consent a 10 blade knife was used to debride the skin and soft tissue back to healthy granulation tissue this was touched with silver nitrate Band-Aid was applied the ulcer is 10 mm in diameter 2 mm deep there is no exposed bone or tendon.  Imaging: No results found. No images are attached to the encounter.  Labs: Lab Results  Component Value Date   HGBA1C 5.6 08/10/2014   ESRSEDRATE 27 (H) 07/05/2015   ESRSEDRATE 80 (H) 01/21/2015   CRP 1.8 (H) 07/05/2015   LABURIC 5.4 02/19/2018   LABURIC 6.0 08/10/2014   REPTSTATUS 01/22/2015 FINAL 01/21/2015   CULT NO GROWTH Performed at [facility]  01/21/2015      Lab Results  Component Value Date   ALBUMIN 3.9 10/22/2018   ALBUMIN 4.1 06/23/2018   ALBUMIN 3.7 03/10/2018   LABURIC 5.4 02/19/2018   LABURIC 6.0 08/10/2014    Lab Results  Component Value Date   MG 1.8 01/21/2015   Lab Results  Component Value Date   VD25OH 32.4 01/23/2017    No results found for: PREALBUMIN CBC EXTENDED Latest Ref Rng & Units 10/22/2018 05/26/2018 03/10/2018  WBC 3.4 - 10.8 x10E3/uL 2.5(LL) 4.1 2.4(L)  RBC 4.14 - 5.80 x10E6/uL 4.30 4.29 4.40  HGB 13.0 - 17.7 g/dL 11.7(L) 12.1(L) 12.3(L)  HCT 37.5 - 51.0 % 36.7(L) 37.6(L) 38.4  PLT 150 - 450 x10E3/uL 219 206.0 205  NEUTROABS 1.4 - 7.7 K/uL - 2.6 1.6  LYMPHSABS 0.7 - 4.0 K/uL - 0.9 0.5(L)     Body mass index is 25.42 kg/m.  Orders:  No orders of the defined types were placed in this encounter.  No orders of the defined types were placed in this encounter.    Procedures: No procedures performed  Clinical Data: No additional findings.  ROS:  All other systems negative, except as noted in the HPI. Review of Systems  Objective: Vital Signs: Ht 6\' 2"  (1.88 m)   Wt 198 lb (89.8 kg)   BMI 25.42 kg/m   Specialty  Comments:  No specialty comments available.  PMFS History: Patient Active Problem List   Diagnosis Date Noted  . Acquired absence of other right toe(s) ([place]) 02/19/2018  . Boil of buttock 10/24/2016  . Amputated toe of left foot ([place]) 10/19/2016  . Amputated toe, right ([place]) 06/13/2016  . Chronic bronchitis ([place]) 11/24/2015  . Other pancytopenia ([place]) 01/24/2015  . Chronic pancreatitis ([place]) 01/24/2015  . Blood poisoning   . Neutropenia ([place])   . Iron deficiency anemia due to chronic blood loss   . Leukopenia   . Sepsis ([place]) 01/21/2015  . Leucopenia 01/21/2015  . Anemia 01/21/2015  . Syncope 01/21/2015  . Diarrhea 01/21/2015  . Hypokalemia 01/21/2015  . Loss of consciousness ([place])   . Eczema 10/21/2014  . Essential hypertension 10/21/2014  . Vasovagal syncope  10/21/2014  . Bilateral conjunctivitis 08/10/2014  . Other chronic pancreatitis ([place]) 08/10/2014  . COLD (chronic obstructive lung disease) ([place]) 08/10/2014  . Chronic ethmoidal sinusitis 08/10/2014  . Bronchitis 10/17/2010  . PLANTAR FASCIITIS, LEFT 10/17/2010  . OTHER NEUTROPENIA 04/21/2010  . CALLUS, TOE 03/15/2010  . LEUKOPENIA, MILD 02/20/2010  . LIVER FUNCTION TESTS, ABNORMAL, HX OF 02/16/2010  . DENTAL CARIES 02/15/2010  . ONYCHOMYCOSIS 07/05/2009  . ALLERGIC URTICARIA 07/05/2009  . PAIN IN LIMB 07/05/2009  . ERECTILE DYSFUNCTION 05/25/2008  . INFLUENZA 05/25/2008  . VIRAL INFECTION 10/01/2007  . HSV 05/02/2007  . CONDYLOMA ACUMINATUM 05/02/2007  . HYPERTENSION 05/02/2007  . ALLERGIC RHINITIS 05/02/2007  . Chronic asthma, mild persistent, uncomplicated Q000111Q  . Gastroesophageal reflux disease without esophagitis 05/02/2007  . PANCREATITIS 05/02/2007   Past Medical History:  Diagnosis Date  . Arthritis   . Boil of buttock   . Chronic bronchitis ([place])   . COPD (chronic obstructive pulmonary disease) ([facility])    Chronic Bronchitis.Advair inhaler   . Dyspnea   . GERD (gastroesophageal reflux disease)    takes Protonix daily  . Gout    takes Allopurinol daily  . Hemorrhoids   . Hypertension    takes HCTZ daily  . Pancreatitis    10 years ago  . Pneumonia 2016  . Tubular adenoma of colon     Family History  Adopted: Yes  Problem Relation Age of Onset  . Cancer Sister 66       unknown origin     Past Surgical History:  Procedure Laterality Date  . AMPUTATION Left 07/14/2014   Procedure: 2nd Toe Amputation;  Surgeon: [name], MD;  Location: [facility];  Service: Orthopedics;  Laterality: Left;  . AMPUTATION Left 11/16/2015   Procedure: Left Great Toe Amputation at Metatarsophalangeal Joint;  Surgeon: [name], MD;  Location: [facility];  Service: Orthopedics;  Laterality: Left;  . AMPUTATION Right 06/13/2016   Procedure: RIGHT 2nd Toe Amputation;  Surgeon:  [name], MD;  Location: [facility];  Service: Orthopedics;  Laterality: Right;  . AMPUTATION Left 10/19/2016   Procedure: Amputation Left 3rd Toe;  Surgeon: [name], MD;  Location: [facility];  Service: Orthopedics;  Laterality: Left;  . TOE AMPUTATION Left 11/16/2015   left great toe   . TOE AMPUTATION Right 06/13/2016  . TONSILLECTOMY     Social History   Occupational History  . Not on file  Tobacco Use  . Smoking status: Never Smoker  . Smokeless tobacco: Never Used  Substance and Sexual Activity  . Alcohol use: Yes    Alcohol/week: 0.0 standard drinks    Comment: rarely  . Drug use:  No  . Sexual activity: Not Currently

## 2019-06-09 ENCOUNTER — Telehealth: Payer: Self-pay | Admitting: Internal Medicine

## 2019-06-09 NOTE — Telephone Encounter (Signed)
Called and spoke with Patient.  Patient requested a sample of Dulera.  At this time we do not have any Dulera samples. Patient requested a symbicort sample instead. Patient stated he needed a prescription sent to [facility]. Symbicort is not on current med list.  Patient stated he was taking Symbicort before Dulera. Patient stated Ruthe Mannan is not covered by his insurance.  LOV 02/06/19 with MW-  Instructions  Plan A = Automatic = dulera 200 Take 2 puffs first thing in am and then another 2 puffs about 12 hours later.   Singulair (montelukast) 10 mg one before sleep for nasal symptoms     Plan B = Backup Only use your albuterol inhaler as a rescue medication to be used if you can't catch your breath by resting or doing a relaxed purse lip breathing pattern.  - The less you use it, the better it will work when you need it. - Ok to use the inhaler up to 2 puffs  every 4 hours if you must but call for appointment if use goes up over your usual need - Don't leave home without it !!  (think of it like the spare tire for your car)   Plan C = Crisis - only use your albuterol nebulizer if you first try Plan B and it fails to help > ok to use the nebulizer up to every 4 hours but if start needing it regularly call for immediate appointment   We will call you with referral to allergy    If any trouble with your medications you'll need to bring your Drug Formulary with options for the more expensive medications     Message routed to Dr. Melvyn Novas to advise on inhalers

## 2019-06-09 NOTE — Telephone Encounter (Signed)
Left message for patient to call back  

## 2019-06-09 NOTE — Telephone Encounter (Signed)
symbicort 160 2bid but needs ov unless established and being followed by allergy in which case they can take over refills so just give x 3 month supply

## 2019-06-11 MED ORDER — BUDESONIDE-FORMOTEROL FUMARATE 160-4.5 MCG/ACT IN AERO: 2.0000 | INHALATION_SPRAY | Freq: Two times a day (BID) | RESPIRATORY_TRACT | 0 refills | Status: DC

## 2019-06-11 NOTE — Telephone Encounter (Signed)
Attempted to call patient, no answer, left message to call back.  

## 2019-06-11 NOTE — Telephone Encounter (Signed)
Called and spoke to patient. Patient stated that he has not yet established with allergy. Sent in a 3 month supply for medication per Dr. Melvyn Novas and scheduled office visit. Nothing further needed at this time.

## 2019-06-12 ENCOUNTER — Other Ambulatory Visit: Payer: Self-pay

## 2019-06-12 ENCOUNTER — Encounter: Payer: Self-pay | Admitting: Internal Medicine

## 2019-06-12 ENCOUNTER — Ambulatory Visit (INDEPENDENT_AMBULATORY_CARE_PROVIDER_SITE_OTHER): Payer: BC Managed Care – PPO | Admitting: Internal Medicine

## 2019-06-12 DIAGNOSIS — J4530 Mild persistent asthma, uncomplicated: Secondary | ICD-10-CM

## 2019-06-12 MED ORDER — MONTELUKAST SODIUM 10 MG PO TABS: 10.0000 mg | ORAL_TABLET | Freq: Every day | ORAL | 3 refills | Status: DC

## 2019-06-12 MED ORDER — BUDESONIDE-FORMOTEROL FUMARATE 160-4.5 MCG/ACT IN AERO: 2.0000 | INHALATION_SPRAY | Freq: Two times a day (BID) | RESPIRATORY_TRACT | 0 refills | Status: DC

## 2019-06-12 NOTE — Patient Instructions (Addendum)
Plan A = Automatic = dulera 200 Take 2 puffs first thing in am and then another 2 puffs about 12 hours later.   Singulair (montelukast) 10 mg one before sleep for nasal symptoms     Plan B = Backup Only use your albuterol inhaler as a rescue medication to be used if you can't catch your breath by resting or doing a relaxed purse lip breathing pattern.  - The less you use it, the better it will work when you need it. - Ok to use the inhaler up to 2 puffs  every 4 hours if you must but call for appointment if use goes up over your usual need - Don't leave home without it !!  (think of it like the spare tire for your car)    Please schedule a follow up visit in 3 months but call sooner if needed  with all medications /inhalers/ solutions in hand so we can verify exactly what you are taking. This includes all medications from all doctors and over the counters Late add:  Error, dulera not covered - he is on symbicort 160 2bid and will be called to correct the avs

## 2019-06-12 NOTE — Progress Notes (Signed)
Stephen Whitaker, male    DOB: 1956-08-06     MRN: [id number]     Brief patient profile:  63  yobm never smoker dx as childhood asthma/ rhinitis with chronic symptoms preventing nl activities much better p age 63 then moved to college at [employer] age 63 more symptoms on some form of daily rx much better on advair and daily albterol  but still bothered by year round rhinitis and then when advair ran out breathing worse then changed to  symbicort and better on symbicort but still using saba bid and referred to pulmonary clinic 05/26/2018 by [name]    History of Present Illness  05/26/2018  Pulmonary / new pt eval / Wert  Chief Complaint  Patient presents with  . Pulmonary Consult    Referred by [name], NP for eval of bronchitis. Pt states he has had issues with bronchitis all of his life. He states he is not currently bothered by a cough or any trouble with breathing.   Dyspnea:  MMRC1 = can walk nl pace, flat grade, can't hurry or go uphills or steps s sob   Cough: none at present though some sense of pnds  Sleep: on side flat bed/ 1-2 pillows SABA use: ventolin avg   twice daily on or off advair or symbicort  rec Plan A = Automatic = Continue Symbicort 160 Take 2 puffs first thing in am and then another 2 puffs about 12 hours later.  Work on inhaler technique:    Plan B = Backup Only use your albuterol (VENTOLIN)   Increase Protonix to Take 30- 60 min before your first and last meals of the day  GERD diet           06/23/2018  f/u ov/Wert re: asthma/ rhinitis with marked atopy / difficulty affording meds despite insurance / did not bring in meds or formulary  Chief Complaint  Patient presents with  . Follow-up    Breathing is doing better. He is using his rescue inhaler 1-2 x per wk on average.   Dyspnea:  Not limited by breathing from desired activities   Cough: none but some sense of pnds day > noct with globus sensation  Sleeping: flat one pillow SABA use: as  above - much less since started symb 160 vs advair prior  rec Plan A = Automatic = Symbicort 160 Take 2 puffs first thing in am and then another 2 puffs about 12 hours later.  Plan B = Backup Only use your albuterol as a rescue medication  If you cannot access the symbicort then return here in 2 weeks with your drug  formulary in hand (this the information)   to see me or one of my nurse practioners   If happy we can see you in 3 months  Late add:  Change proair respiclick to hfa (inadvertantly called in the dpi form which is likely to irrritate his throat)    02/06/2019  f/u ov/Wert re: asthma/ rhinitis  Chief Complaint  Patient presents with  . Follow-up    Breathing is doing well. He is using his rescue inhaler on average once per month. +   Dyspnea:   Fine as long as on symb Cough: none/ still nasal drainage/congestion does not recall whether singulair helped  Sleeping: flat / one pillow SABA use: none while on symbicort  - issue is formulary restriction rec Plan A = Automatic = dulera 200 Take 2 puffs first thing in am  and then another 2 puffs about 12 hours later.  Singulair (montelukast) 10 mg one before sleep for nasal symptoms   Plan B = Backup Only use your albuterol inhaler as a rescue medication  Plan C = Crisis - only use your albuterol nebulizer if you first try Plan B and it fails to help > ok to use the nebulizer up to every 4 hours but if start needing it regularly call for immediate appointment We will call you with referral to allergy > never went      06/12/2019  f/u ov/Wert re: asthma/ rhinitis easily confused with details of care Chief Complaint  Patient presents with  . Follow-up    Breathing is doing well and he states he rarely uses his albuterol. No new co's.    Dyspnea:  Not limited by breathing from desired activities   Cough: nasal drainge/ congestion Sleeping: fine flat  SABA use: rare 02: none    No obvious day to day or daytime variability or  assoc excess/ purulent sputum or mucus plugs or hemoptysis or cp or chest tightness, subjective wheeze or overt sinus or hb symptoms.   Sleeping  without nocturnal  or early am exacerbation  of respiratory  c/o's or need for noct saba. Also denies any obvious fluctuation of symptoms with weather or environmental changes or other aggravating or alleviating factors except as outlined above   No unusual exposure hx or h/o childhood pna  or knowledge of premature birth.  Current Allergies, Complete Past Medical History, Past Surgical History, Family History, and Social History were reviewed in [employer] record.  ROS  The following are not active complaints unless bolded Hoarseness, sore throat, dysphagia, dental problems, itching, sneezing,  nasal congestion or discharge of excess mucus or purulent secretions, ear ache,   fever, chills, sweats, unintended wt loss or wt gain, classically pleuritic or exertional cp,  orthopnea pnd or arm/hand swelling  or leg swelling, presyncope, palpitations, abdominal pain, anorexia, nausea, vomiting, diarrhea  or change in bowel habits or change in bladder habits, change in stools or change in urine, dysuria, hematuria,  rash, arthralgias, visual complaints, headache, numbness, weakness or ataxia or problems with walking or coordination,  change in mood or  memory.        Current Meds  Medication Sig  . albuterol (PROAIR HFA) 108 (90 Base) MCG/ACT inhaler Inhale 2 puffs into the lungs every 4 (four) hours as needed for wheezing or shortness of breath.  . allopurinol (ZYLOPRIM) 300 MG tablet Take 1 tablet (300 mg total) by mouth daily. Must have office visit for refills  . amLODipine (NORVASC) 5 MG tablet Take 1 tablet (5 mg total) by mouth daily.  Marland Kitchen Bioflavonoid Products (ESTER-C) TABS Take 1 tablet by mouth daily.  Marland Kitchen COLCRYS 0.6 MG tablet TAKE ONE TABLET BY MOUTH ONCE DAILY TO TWICE DAILY AS NEEDED FOR  GOUT  . fluticasone (FLONASE) 50  MCG/ACT nasal spray Place 1 spray into both nostrils daily.  . hydrochlorothiazide (HYDRODIURIL) 25 MG tablet Take 1 tablet (25 mg total) by mouth daily. Must have office visit for refills  . ibuprofen (ADVIL) 200 MG tablet Take 200 mg by mouth every 6 (six) hours as needed.  Marland Kitchen ibuprofen (ADVIL) 800 MG tablet Take 1 tablet (800 mg total) by mouth every 8 (eight) hours as needed.  . mometasone-formoterol (DULERA) 200-5 MCG/ACT AERO Take 2 puffs first thing in am and then another 2 puffs about 12 hours later.  Marland Kitchen  montelukast (SINGULAIR) 10 MG tablet Take 1 tablet (10 mg total) by mouth at bedtime.  . pantoprazole (PROTONIX) 40 MG tablet TAKE 30 TO 60 MINUTES BEFORE YOUR FIRST AND LAST MEALS OF THE DAY  . SPIRULINA PO Take 1 capsule by mouth 3 (three) times daily.                Objective:      06/12/2019      195  02/06/2019       198   06/23/18 200 lb 12.8 oz (91.1 kg)  06/23/18 209 lb (94.8 kg)  05/26/18 196 lb 9.6 oz (89.2 kg)     Vital signs reviewed - Note on arrival 02 sats  97% on RA     HEENT : pt wearing mask not removed for exam due to covid -19 concerns.    NECK :  without JVD/Nodes/TM/ nl carotid upstrokes bilaterally   LUNGS: no acc muscle use,  Nl contour chest which is clear to A and P bilaterally without cough on insp or exp maneuvers   CV:  RRR  no s3 or murmur or increase in P2, and no edema   ABD:  soft and nontender with nl inspiratory excursion in the supine position. No bruits or organomegaly appreciated, bowel sounds nl  MS:  Nl gait/ ext warm without deformities, calf tenderness, cyanosis or clubbing No obvious joint restrictions   SKIN: warm and dry without lesions    NEURO:  alert, approp, nl sensorium with  no motor or cerebellar deficits apparent.     Assessment

## 2019-06-13 ENCOUNTER — Encounter: Payer: Self-pay | Admitting: Internal Medicine

## 2019-06-13 NOTE — Assessment & Plan Note (Addendum)
Onset in childhood Allergy profile 05/26/2018 >  Eos 0.1/  IgE  905  RAST pan pos - Spirometry 05/26/2018  FEV1 2.5 (75%)  Ratio 77 min curvature p am symb 160 x2 puffs  - 05/26/2018   continue symbicort 160 / leave off singulair as says can't afford it on BCBS plan/ submitted az and me paperwork for symb - 02/06/2019   symbicort 160 (or dulera 200 depending on formulary)  added singulair  Trial > did not start - referred to allergy 02/06/2019  "could not connect" - 06/12/2019  After extensive coaching inhaler device,  effectiveness =    95% - 06/12/2019 add singulair for nasal symptoms and blow symbicort out thru the nose  All goals of chronic asthma control met including optimal function and elimination of symptoms with minimal need for rescue therapy.  Contingencies discussed in full including contacting this office immediately if not controlling the symptoms using the rule of two's.     Advised:  formulary restrictions will be an ongoing challenge for the forseable future and I would be happy to pick an alternative if the pt will first  provide me a list of them -  pt  will need to return here for training for any new device that is required eg dpi vs hfa vs respimat.    In the meantime we can always provide samples so that the patient never runs out of any needed respiratory medications.   Advised: ok to defer allergy eval as long as his asthma is well controlled on symbicort / singulair and nasal symptoms are tolerable s need for prednisone for acute flares or chonic rx    I had an extended discussion with the patient reviewing all relevant studies completed to date and  lasting 15 to 20 minutes of a 25 minute visit    I performed detailed device teaching using a teach back method which extended face to face time for this visit (see above)  Each maintenance medication was reviewed in detail including emphasizing most importantly the difference between maintenance and prns and under what  circumstances the prns are to be triggered using an action plan format that is not reflected in the computer generated alphabetically organized AVS which I have not found useful in most complex patients, especially with respiratory illnesses  Please see AVS for specific instructions unique to this visit that I personally wrote and verbalized to the the pt in detail and then reviewed with pt  by my nurse highlighting any  changes in therapy recommended at today's visit to their plan of care.

## 2019-06-20 ENCOUNTER — Other Ambulatory Visit: Payer: Self-pay | Admitting: Family Medicine

## 2019-06-20 DIAGNOSIS — I10 Essential (primary) hypertension: Secondary | ICD-10-CM

## 2019-06-24 ENCOUNTER — Other Ambulatory Visit: Payer: Self-pay | Admitting: Nurse Practitioner

## 2019-06-24 DIAGNOSIS — J42 Unspecified chronic bronchitis: Secondary | ICD-10-CM

## 2019-06-25 ENCOUNTER — Other Ambulatory Visit: Payer: Self-pay | Admitting: Internal Medicine

## 2019-06-25 DIAGNOSIS — K219 Gastro-esophageal reflux disease without esophagitis: Secondary | ICD-10-CM

## 2019-06-26 ENCOUNTER — Other Ambulatory Visit: Payer: Self-pay

## 2019-06-26 ENCOUNTER — Ambulatory Visit: Payer: BC Managed Care – PPO | Attending: Nurse Practitioner | Admitting: Nurse Practitioner

## 2019-06-26 ENCOUNTER — Encounter: Payer: Self-pay | Admitting: Nurse Practitioner

## 2019-06-26 VITALS — BP 144/87 | HR 69 | Temp 98.2°F | Resp 17 | Wt 199.0 lb

## 2019-06-26 DIAGNOSIS — D696 Thrombocytopenia, unspecified: Secondary | ICD-10-CM | POA: Insufficient documentation

## 2019-06-26 DIAGNOSIS — I10 Essential (primary) hypertension: Secondary | ICD-10-CM

## 2019-06-26 DIAGNOSIS — Z136 Encounter for screening for cardiovascular disorders: Secondary | ICD-10-CM

## 2019-06-26 DIAGNOSIS — J42 Unspecified chronic bronchitis: Secondary | ICD-10-CM

## 2019-06-26 DIAGNOSIS — Z13220 Encounter for screening for lipoid disorders: Secondary | ICD-10-CM

## 2019-06-26 DIAGNOSIS — M1A09X0 Idiopathic chronic gout, multiple sites, without tophus (tophi): Secondary | ICD-10-CM | POA: Diagnosis not present

## 2019-06-26 DIAGNOSIS — M2550 Pain in unspecified joint: Secondary | ICD-10-CM

## 2019-06-26 DIAGNOSIS — D708 Other neutropenia: Secondary | ICD-10-CM | POA: Diagnosis not present

## 2019-06-26 DIAGNOSIS — K861 Other chronic pancreatitis: Secondary | ICD-10-CM

## 2019-06-26 DIAGNOSIS — M216X2 Other acquired deformities of left foot: Secondary | ICD-10-CM | POA: Diagnosis not present

## 2019-06-26 MED ORDER — IBUPROFEN 800 MG PO TABS: 800.0000 mg | ORAL_TABLET | Freq: Three times a day (TID) | ORAL | 1 refills | Status: DC | PRN

## 2019-06-26 MED ORDER — AMLODIPINE BESYLATE 10 MG PO TABS: 10.0000 mg | ORAL_TABLET | Freq: Every day | ORAL | 1 refills | Status: DC

## 2019-06-26 MED ORDER — ALLOPURINOL 300 MG PO TABS: 300.0000 mg | ORAL_TABLET | Freq: Every day | ORAL | 3 refills | Status: DC

## 2019-06-26 NOTE — Progress Notes (Signed)
Patient here for medication refills.  Doesn't check BP regularly since COVID 19. Was using machine at [facility]. Denies chest pain, SHOB, headaches, palpitations, dizziness. Has a little swelling in his ankles.  Hasn't taken medication today. Just got off work.

## 2019-06-26 NOTE — Progress Notes (Signed)
Assessment & Plan:  Stephen Whitaker was seen today for medication refill.  Diagnoses and all orders for this visit:  Essential hypertension -     CMP14+EGFR -     amLODipine (NORVASC) 10 MG tablet; Take 1 tablet (10 mg total) by mouth daily. Continue all antihypertensives as prescribed.  Remember to bring in your blood pressure log with you for your follow up appointment.  DASH/Mediterranean Diets are healthier choices for HTN.    Chronic gout of multiple sites, unspecified cause -     allopurinol (ZYLOPRIM) 300 MG tablet; Take 1 tablet (300 mg total) by mouth daily.  Arthralgia of multiple joints -     ibuprofen (ADVIL) 800 MG tablet; Take 1 tablet (800 mg total) by mouth every 8 (eight) hours as needed. Instructed to take sparingly as can also affect WBC levels  Other neutropenia (HCC) -     CBC with Differential  Chronic bronchitis, unspecified chronic bronchitis type ([place]) WELL CONTROLLED Continue follow up with Pulmonology Continue symbicort as prescribed  Other chronic pancreatitis (HCC) -     Lipase Avoid alcohol use  Thrombocytopenia (HCC) -     CBC with Differential  Pronation of left foot -     Ambulatory referral to [place] -     Lipid Panel    Patient has been counseled on age-appropriate routine health concerns for screening and prevention. These are reviewed and up-to-date. Referrals have been placed accordingly. Immunizations are up-to-date or declined.    Subjective:   Chief Complaint  Patient presents with  . Medication Refill   HPI Stephen Whitaker 63 y.o. male presents to office today for follow up.  He recently saw Dr. Corrinne Eagle for Stephen Whitaker grade 1 ulcer on the plantar aspect of the left foot. States Ulcer seems to be healing but "taking it's time".   There seems to be some significant over pronation of the left foot as well. Will have him see Ortho for recommendations. .     Essential Hypertension Chronic. Slightly  elevated today. Denies chest pain, shortness of breath, palpitations, lightheadedness, dizziness, headaches or BLE edema. He does not monitor his blood pressure at home.  Taking amlodipine 5 mg daily and hydrochlorothiazide 25 mg daily as prescribed.  At this time we will increase the amlodipine to 10 mg daily and discontinue the hydrochlorothiazide as he has a history of hypokalemia and gout flares. BP Readings from Last 3 Encounters:  06/26/19 (!) 144/87  06/12/19 130/84  02/06/19 102/64   Gout Well controlled with allopurinol. He could not afford the colcrys but denies any recent gout flares.  Lab Results  Component Value Date   LABURIC 5.4 02/19/2018   Neutropenia  He has chronic fluctuating mild neutropenia dating back to 2011. Last office visit with Oncology 02-2018. Given his overall mild and chronic neutropenia with mostly normal Hgb and platelet count Dr. Burr Medico had high suspicion that neutropenia was benign. She did not recommend further imaging or bone marrow biopsy at that time. However patient was instructed to follow up after 6 months. WBC as low as 2.1.   Review of Systems  Constitutional: Negative for fever, malaise/fatigue and weight loss.  HENT: Negative.  Negative for nosebleeds.   Eyes: Negative.  Negative for blurred vision, double vision and photophobia.  Respiratory: Negative.  Negative for cough and shortness of breath.   Cardiovascular: Negative.  Negative for chest pain, palpitations and leg swelling.  Gastrointestinal: Positive for heartburn. Negative for nausea and  vomiting.  Musculoskeletal: Positive for joint pain. Negative for myalgias.  Skin:       Foot ulcer  Neurological: Negative.  Negative for dizziness, focal weakness, seizures and headaches.  Psychiatric/Behavioral: Negative.  Negative for suicidal ideas.    Past Medical History:  Diagnosis Date  . Arthritis   . Boil of buttock   . Chronic bronchitis ([place])   . COPD (chronic obstructive pulmonary  disease) (HCC)    Chronic Bronchitis.Advair inhaler   . Dyspnea   . GERD (gastroesophageal reflux disease)    takes Protonix daily  . Gout    takes Allopurinol daily  . Hemorrhoids   . Hypertension    takes HCTZ daily  . Pancreatitis    10 years ago  . Pneumonia 2016  . Tubular adenoma of colon     Past Surgical History:  Procedure Laterality Date  . AMPUTATION Left 07/14/2014   Procedure: 2nd Toe Amputation;  Surgeon: [name], MD;  Location: [place];  Service: Orthopedics;  Laterality: Left;  . AMPUTATION Left 11/16/2015   Procedure: Left Great Toe Amputation at Metatarsophalangeal Joint;  Surgeon: [name], MD;  Location: [place];  Service: Orthopedics;  Laterality: Left;  . AMPUTATION Right 06/13/2016   Procedure: RIGHT 2nd Toe Amputation;  Surgeon: [name], MD;  Location: [place];  Service: Orthopedics;  Laterality: Right;  . AMPUTATION Left 10/19/2016   Procedure: Amputation Left 3rd Toe;  Surgeon: [name], MD;  Location: [place];  Service: Orthopedics;  Laterality: Left;  . TOE AMPUTATION Left 11/16/2015   left great toe   . TOE AMPUTATION Right 06/13/2016  . TONSILLECTOMY      Family History  Adopted: Yes  Problem Relation Age of Onset  . Cancer Sister 55       unknown origin     Social History Reviewed with no changes to be made today.   Outpatient Medications Prior to Visit  Medication Sig Dispense Refill  . Bioflavonoid Products (ESTER-C) TABS Take 1 tablet by mouth daily.    . budesonide-formoterol (SYMBICORT) 160-4.5 MCG/ACT inhaler Inhale 2 puffs into the lungs 2 (two) times daily. 3 Inhaler 0  . COLCRYS 0.6 MG tablet TAKE ONE TABLET BY MOUTH ONCE DAILY TO TWICE DAILY AS NEEDED FOR  GOUT 60 tablet 12  . montelukast (SINGULAIR) 10 MG tablet Take 1 tablet (10 mg total) by mouth at bedtime. 90 tablet 3  . pantoprazole (PROTONIX) 40 MG tablet TAKE 30 TO 60 MINUTES BEFORE YOUR FIRST AND LAST MEALS OF THE DAY 60 tablet 2  . SPIRULINA PO Take 1  capsule by mouth 3 (three) times daily.    Marland Kitchen allopurinol (ZYLOPRIM) 300 MG tablet Take 1 tablet (300 mg total) by mouth daily. Must have office visit for refills 60 tablet 0  . amLODipine (NORVASC) 5 MG tablet Take 1 tablet (5 mg total) by mouth daily. 90 tablet 3  . hydrochlorothiazide (HYDRODIURIL) 25 MG tablet Take 1 tablet (25 mg total) by mouth daily. Must have office visit for refills 30 tablet 0  . albuterol (PROAIR HFA) 108 (90 Base) MCG/ACT inhaler Inhale 2 puffs into the lungs every 4 (four) hours as needed for wheezing or shortness of breath. 1 Inhaler 3  . fluticasone (FLONASE) 50 MCG/ACT nasal spray Use 1 spray(s) in each nostril once daily 16 g 0  . ibuprofen (ADVIL) 200 MG tablet Take 200 mg by mouth every 6 (six) hours as needed.    Marland Kitchen ibuprofen (ADVIL)  800 MG tablet Take 1 tablet (800 mg total) by mouth every 8 (eight) hours as needed. 90 tablet 0   No facility-administered medications prior to visit.     Allergies  Allergen Reactions  . Ciprofloxacin Rash and Other (See Comments)    SYNCOPE   . Shellfish Allergy Anaphylaxis  . Sulfa Antibiotics Other (See Comments)    SYNCOPE DIZZINESS        Objective:    BP (!) 144/87   Pulse 69   Temp 98.2 F (36.8 C) (Temporal)   Resp 17   Wt 199 lb (90.3 kg)   SpO2 98%   BMI 25.21 kg/m  Wt Readings from Last 3 Encounters:  06/26/19 199 lb (90.3 kg)  06/12/19 195 lb (88.5 kg)  06/04/19 198 lb (89.8 kg)    Physical Exam Vitals signs and nursing note reviewed.  Constitutional:      Appearance: He is well-developed.  HENT:     Head: Normocephalic and atraumatic.  Neck:     Musculoskeletal: Normal range of motion.  Cardiovascular:     Rate and Rhythm: Normal rate and regular rhythm.     Heart sounds: Normal heart sounds. No murmur. No friction rub. No gallop.   Pulmonary:     Effort: Pulmonary effort is normal. No tachypnea or respiratory distress.     Breath sounds: Normal breath sounds. No decreased breath  sounds, wheezing, rhonchi or rales.  Chest:     Chest wall: No tenderness.  Abdominal:     General: Bowel sounds are normal.     Palpations: Abdomen is soft.  Musculoskeletal: Normal range of motion.  Skin:    General: Skin is warm and dry.  Neurological:     Mental Status: He is alert and oriented to person, place, and time.     Coordination: Coordination normal.  Psychiatric:        Behavior: Behavior normal. Behavior is cooperative.        Thought Content: Thought content normal.        Judgment: Judgment normal.          Patient has been counseled extensively about nutrition and exercise as well as the importance of adherence with medications and regular follow-up. The patient was given clear instructions to go to ER or return to medical center if symptoms don't improve, worsen or new problems develop. The patient verbalized understanding.   Follow-up: Return for 2 weeks LUKE BP RECHECK; Moriarty, FNP-BC [facility] [place], [place]   06/26/2019, 12:10 PM

## 2019-06-27 ENCOUNTER — Other Ambulatory Visit: Payer: Self-pay | Admitting: Nurse Practitioner

## 2019-06-27 DIAGNOSIS — D708 Other neutropenia: Secondary | ICD-10-CM

## 2019-06-27 LAB — CMP14+EGFR
ALT: 49 IU/L — ABNORMAL HIGH (ref 0–44)
AST: 85 IU/L — ABNORMAL HIGH (ref 0–40)
Albumin/Globulin Ratio: 1.1 — ABNORMAL LOW (ref 1.2–2.2)
Albumin: 3.9 g/dL (ref 3.8–4.8)
Alkaline Phosphatase: 108 IU/L (ref 39–117)
BUN/Creatinine Ratio: 12 (ref 10–24)
BUN: 11 mg/dL (ref 8–27)
Bilirubin Total: 0.3 mg/dL (ref 0.0–1.2)
CO2: 23 mmol/L (ref 20–29)
Calcium: 9.2 mg/dL (ref 8.6–10.2)
Chloride: 105 mmol/L (ref 96–106)
Creatinine, Ser: 0.92 mg/dL (ref 0.76–1.27)
GFR calc Af Amer: 101 mL/min/{1.73_m2} (ref >59)
GFR calc non Af Amer: 88 mL/min/{1.73_m2} (ref >59)
Globulin, Total: 3.5 g/dL (ref 1.5–4.5)
Glucose: 100 mg/dL — ABNORMAL HIGH (ref 65–99)
Potassium: 4 mmol/L (ref 3.5–5.2)
Sodium: 141 mmol/L (ref 134–144)
Total Protein: 7.4 g/dL (ref 6.0–8.5)

## 2019-06-27 LAB — LIPID PANEL
Chol/HDL Ratio: 4 ratio (ref 0.0–5.0)
Cholesterol, Total: 155 mg/dL (ref 100–199)
HDL: 39 mg/dL — ABNORMAL LOW (ref >39)
LDL Chol Calc (NIH): 94 mg/dL (ref 0–99)
Triglycerides: 121 mg/dL (ref 0–149)
VLDL Cholesterol Cal: 22 mg/dL (ref 5–40)

## 2019-06-27 LAB — CBC WITH DIFFERENTIAL/PLATELET
Basophils Absolute: 0 10*3/uL (ref 0.0–0.2)
Basos: 1 %
EOS (ABSOLUTE): 0.1 10*3/uL (ref 0.0–0.4)
Eos: 4 %
Hematocrit: 36.5 % — ABNORMAL LOW (ref 37.5–51.0)
Hemoglobin: 11.9 g/dL — ABNORMAL LOW (ref 13.0–17.7)
Immature Grans (Abs): 0 10*3/uL (ref 0.0–0.1)
Immature Granulocytes: 0 %
Lymphocytes Absolute: 0.9 10*3/uL (ref 0.7–3.1)
Lymphs: 33 %
MCH: 28.7 pg (ref 26.6–33.0)
MCHC: 32.6 g/dL (ref 31.5–35.7)
MCV: 88 fL (ref 79–97)
Monocytes Absolute: 0.5 10*3/uL (ref 0.1–0.9)
Monocytes: 16 %
Neutrophils Absolute: 1.3 10*3/uL — ABNORMAL LOW (ref 1.4–7.0)
Neutrophils: 46 %
Platelets: 247 10*3/uL (ref 150–450)
RBC: 4.14 x10E6/uL (ref 4.14–5.80)
RDW: 14.2 % (ref 11.6–15.4)
WBC: 2.8 10*3/uL — ABNORMAL LOW (ref 3.4–10.8)

## 2019-06-27 LAB — LIPASE: Lipase: 55 U/L (ref 13–78)

## 2019-06-30 ENCOUNTER — Telehealth: Payer: Self-pay | Admitting: Nurse Practitioner

## 2019-06-30 NOTE — Telephone Encounter (Signed)
Called pt to schedule appt per 11/10 sch message :  Department: [facility]  Provider:   Scheduling Notes:  patient was referred back to [place], please call him to schedule lab, f/u with me or Dr. Burr Medico next week 11/18 or 11/19   thanks,  lacie      Pt says he's already been here before and will call back - is not interested in scheduling an appt at the moment

## 2019-06-30 NOTE — Telephone Encounter (Signed)
OK, thanks for the update.  Regan Rakers

## 2019-07-02 ENCOUNTER — Other Ambulatory Visit: Payer: Self-pay

## 2019-07-02 ENCOUNTER — Encounter: Payer: Self-pay | Admitting: Orthopedic Surgery

## 2019-07-02 ENCOUNTER — Ambulatory Visit (INDEPENDENT_AMBULATORY_CARE_PROVIDER_SITE_OTHER): Payer: BC Managed Care – PPO | Admitting: Orthopedic Surgery

## 2019-07-02 DIAGNOSIS — M1A09X0 Idiopathic chronic gout, multiple sites, without tophus (tophi): Secondary | ICD-10-CM

## 2019-07-02 DIAGNOSIS — M2550 Pain in unspecified joint: Secondary | ICD-10-CM | POA: Diagnosis not present

## 2019-07-02 MED ORDER — ALLOPURINOL 300 MG PO TABS: 300.0000 mg | ORAL_TABLET | Freq: Every day | ORAL | 3 refills | Status: DC

## 2019-07-02 MED ORDER — IBUPROFEN 800 MG PO TABS: 800.0000 mg | ORAL_TABLET | Freq: Three times a day (TID) | ORAL | 1 refills | Status: DC | PRN

## 2019-07-02 MED ORDER — COLCRYS 0.6 MG PO TABS: ORAL_TABLET | ORAL | 12 refills | Status: DC

## 2019-07-02 NOTE — Progress Notes (Signed)
Office Visit Note   Patient: Stephen Whitaker           Date of Birth: 1955/08/27           MRN: [id number] Visit Date: 07/02/2019              Requested by: [name], NP [address] [place],  [place] [address] PCP: [name], NP  Chief Complaint  Patient presents with  . Left Foot - Follow-up    Ulcer plantar aspect foot       HPI: S/p left great toe amputation. He is being seen for follow up for his plantar forefoot ulcer. He has been wearing a dounut.  Assessment & Plan: Visit Diagnoses:  1. Chronic gout of multiple sites, unspecified cause   2. Arthralgia of multiple joints     Plan: Continue offloading with donut.   Follow-Up Instructions: 4 weeks  Ortho Exam  Patient is alert, oriented, no adenopathy, well-dressed, normal affect, normal respiratory effort. Left forefoot ulcer: Does not probe deep no foul odor, no cellulitis, Central healthy granulation tissue surrounded by callus.   Imaging: No results found. No images are attached to the encounter.  Labs: Lab Results  Component Value Date   HGBA1C 5.6 08/10/2014   ESRSEDRATE 27 (H) 07/05/2015   ESRSEDRATE 80 (H) 01/21/2015   CRP 1.8 (H) 07/05/2015   LABURIC 5.4 02/19/2018   LABURIC 6.0 08/10/2014   REPTSTATUS 01/22/2015 FINAL 01/21/2015   CULT NO GROWTH Performed at [facility]  01/21/2015     Lab Results  Component Value Date   ALBUMIN 3.9 06/26/2019   ALBUMIN 3.9 10/22/2018   ALBUMIN 4.1 06/23/2018   LABURIC 5.4 02/19/2018   LABURIC 6.0 08/10/2014    Lab Results  Component Value Date   MG 1.8 01/21/2015   Lab Results  Component Value Date   VD25OH 32.4 01/23/2017    No results found for: PREALBUMIN CBC EXTENDED Latest Ref Rng & Units 06/26/2019 10/22/2018 05/26/2018  WBC 3.4 - 10.8 x10E3/uL 2.8(L) 2.5(LL) 4.1  RBC 4.14 - 5.80 x10E6/uL 4.14 4.30 4.29  HGB 13.0 - 17.7 g/dL 11.9(L) 11.7(L) 12.1(L)  HCT 37.5 - 51.0 % 36.5(L) 36.7(L) 37.6(L)  PLT 150 - 450  x10E3/uL 247 219 206.0  NEUTROABS 1.4 - 7.0 x10E3/uL 1.3(L) - 2.6  LYMPHSABS 0.7 - 3.1 x10E3/uL 0.9 - 0.9     Body mass index is 25.55 kg/m.  Orders:  No orders of the defined types were placed in this encounter.  Meds ordered this encounter  Medications  . allopurinol (ZYLOPRIM) 300 MG tablet    Sig: Take 1 tablet (300 mg total) by mouth daily.    Dispense:  60 tablet    Refill:  3  . COLCRYS 0.6 MG tablet    Sig: TAKE ONE TABLET BY MOUTH ONCE DAILY TO TWICE DAILY AS NEEDED FOR  GOUT    Dispense:  60 tablet    Refill:  12    Please consider 90 day supplies to promote better adherence  . ibuprofen (ADVIL) 800 MG tablet    Sig: Take 1 tablet (800 mg total) by mouth every 8 (eight) hours as needed.    Dispense:  90 tablet    Refill:  1     Procedures: After verbal consent obtained, callus was trimmed back to soft healthy skin surface Clinical Data: No additional findings.  ROS:  All other systems negative, except as noted in the HPI. Review of Systems  Objective: Vital Signs: Ht 6\' 2"  (1.88 m)   Wt 199 lb (90.3 kg)   BMI 25.55 kg/m   Specialty Comments:  No specialty comments available.  PMFS History: Patient Active Problem List   Diagnosis Date Noted  . Thrombocytopenia ([place]) 06/26/2019  . Acquired absence of other right toe(s) ([place]) 02/19/2018  . Boil of buttock 10/24/2016  . Amputated toe of left foot ([place]) 10/19/2016  . Amputated toe, right ([place]) 06/13/2016  . Chronic bronchitis ([place]) 11/24/2015  . Other pancytopenia ([place]) 01/24/2015  . Chronic pancreatitis ([place]) 01/24/2015  . Blood poisoning   . Neutropenia ([place])   . Iron deficiency anemia due to chronic blood loss   . Leukopenia   . Sepsis ([place]) 01/21/2015  . Leucopenia 01/21/2015  . Anemia 01/21/2015  . Syncope 01/21/2015  . Diarrhea 01/21/2015  . Hypokalemia 01/21/2015  . Loss of consciousness ([place])   . Eczema 10/21/2014  . Essential hypertension 10/21/2014  . Vasovagal syncope 10/21/2014   . Bilateral conjunctivitis 08/10/2014  . Other chronic pancreatitis ([place]) 08/10/2014  . COLD (chronic obstructive lung disease) ([place]) 08/10/2014  . Chronic ethmoidal sinusitis 08/10/2014  . Bronchitis 10/17/2010  . PLANTAR FASCIITIS, LEFT 10/17/2010  . OTHER NEUTROPENIA 04/21/2010  . CALLUS, TOE 03/15/2010  . LEUKOPENIA, MILD 02/20/2010  . LIVER FUNCTION TESTS, ABNORMAL, HX OF 02/16/2010  . DENTAL CARIES 02/15/2010  . ONYCHOMYCOSIS 07/05/2009  . ALLERGIC URTICARIA 07/05/2009  . PAIN IN LIMB 07/05/2009  . ERECTILE DYSFUNCTION 05/25/2008  . INFLUENZA 05/25/2008  . VIRAL INFECTION 10/01/2007  . HSV 05/02/2007  . CONDYLOMA ACUMINATUM 05/02/2007  . HYPERTENSION 05/02/2007  . ALLERGIC RHINITIS 05/02/2007  . Chronic asthma, mild persistent, uncomplicated [id number]  . Gastroesophageal reflux disease without esophagitis 05/02/2007  . PANCREATITIS 05/02/2007   Past Medical History:  Diagnosis Date  . Arthritis   . Boil of buttock   . Chronic bronchitis ([place])   . COPD (chronic obstructive pulmonary disease) ([facility])    Chronic Bronchitis.Advair inhaler   . Dyspnea   . GERD (gastroesophageal reflux disease)    takes Protonix daily  . Gout    takes Allopurinol daily  . Hemorrhoids   . Hypertension    takes HCTZ daily  . Pancreatitis    10 years ago  . Pneumonia 2016  . Tubular adenoma of colon     Family History  Adopted: Yes  Problem Relation Age of Onset  . Cancer Sister 17       unknown origin     Past Surgical History:  Procedure Laterality Date  . AMPUTATION Left 07/14/2014   Procedure: 2nd Toe Amputation;  Surgeon: [name], MD;  Location: [facility];  Service: Orthopedics;  Laterality: Left;  . AMPUTATION Left 11/16/2015   Procedure: Left Great Toe Amputation at Metatarsophalangeal Joint;  Surgeon: [name], MD;  Location: [facility];  Service: Orthopedics;  Laterality: Left;  . AMPUTATION Right 06/13/2016   Procedure: RIGHT 2nd Toe Amputation;  Surgeon: [name], MD;  Location: [facility];  Service: Orthopedics;  Laterality: Right;  . AMPUTATION Left 10/19/2016   Procedure: Amputation Left 3rd Toe;  Surgeon: [name], MD;  Location: [facility];  Service: Orthopedics;  Laterality: Left;  . TOE AMPUTATION Left 11/16/2015   left great toe   . TOE AMPUTATION Right 06/13/2016  . TONSILLECTOMY     Social History   Occupational History  . Not on file  Tobacco Use  . Smoking status: Never Smoker  . Smokeless tobacco:  Never Used  Substance and Sexual Activity  . Alcohol use: Yes    Alcohol/week: 0.0 standard drinks    Comment: rarely  . Drug use: No  . Sexual activity: Not Currently

## 2019-07-03 ENCOUNTER — Telehealth (INDEPENDENT_AMBULATORY_CARE_PROVIDER_SITE_OTHER): Payer: Self-pay

## 2019-07-03 NOTE — Telephone Encounter (Signed)
Patient date of birth verified by date of birth. He is aware that his kidney function is normal, lipase for pancrease is normal. Liver levels still slightly elevated but stable. Avoid alcohol, tylenol/acetaminophen. Labs show anemia and your WBC is still low. Patient has already been contacted by blood specialist, he declined an appointment, does not wish to go back. Nat Christen, CMA

## 2019-07-03 NOTE — Telephone Encounter (Signed)
-----   Message from [name], NP sent at 06/27/2019  9:09 PM EST ----- Kidney function is normal.  Liver levels are still sightly elevated but stable. Avoid alcohol, tylenol/acetominophen. Your labs show anemia and your WBC is still low. I am referring you back to the blood specialist for follow up. They will call you to schedule. Lipase for pancreas is normal.

## 2019-07-05 NOTE — Telephone Encounter (Signed)
NOTED

## 2019-07-08 ENCOUNTER — Encounter (HOSPITAL_COMMUNITY): Payer: Self-pay | Admitting: Emergency Medicine

## 2019-07-08 ENCOUNTER — Ambulatory Visit (HOSPITAL_COMMUNITY)
Admission: EM | Admit: 2019-07-08 | Discharge: 2019-07-08 | Disposition: A | Discharge status: Home / Self Care | Payer: BC Managed Care – PPO | Attending: Emergency Medicine | Admitting: Emergency Medicine

## 2019-07-08 ENCOUNTER — Telehealth: Payer: Self-pay | Admitting: Nurse Practitioner

## 2019-07-08 ENCOUNTER — Telehealth: Payer: Self-pay | Admitting: Physician Assistant

## 2019-07-08 ENCOUNTER — Other Ambulatory Visit: Payer: Self-pay | Admitting: Family Medicine

## 2019-07-08 ENCOUNTER — Other Ambulatory Visit: Payer: Self-pay

## 2019-07-08 DIAGNOSIS — I10 Essential (primary) hypertension: Secondary | ICD-10-CM

## 2019-07-08 MED ORDER — COLCHICINE 0.6 MG PO CAPS: 1.0000 | ORAL_CAPSULE | Freq: Every day | ORAL | 0 refills | Status: DC

## 2019-07-08 MED ORDER — BLOOD PRESSURE MONITORING DEVI: 1.0000 [IU] | Freq: Every day | 0 refills | Status: DC

## 2019-07-08 NOTE — Telephone Encounter (Signed)
Received notification from front office staff that patient has been sent to the ED d/t high blood pressure. Will route to PCP.

## 2019-07-08 NOTE — Telephone Encounter (Signed)
Patient's reference [id number].

## 2019-07-08 NOTE — ED Triage Notes (Signed)
Blood pressure medicine was changed a week ago.  Today patient checked blood pressure , registered high.  Blood pressure reading was 160/98.  Patient denies feeling bad, patient did have a headache this morning when he woke.

## 2019-07-08 NOTE — Telephone Encounter (Signed)
Patient came stating that the bp has gone up 160/109. I sent him to urgent care

## 2019-07-08 NOTE — Discharge Instructions (Addendum)
Decrease your salt intake. diet and exercise will lower your blood pressure significantly. It is important to keep your blood pressure under good control, as having a elevated blood pressure for prolonged periods of time significantly increases your risk of stroke, heart attacks, kidney damage, eye damage, and other problems. Measure your blood pressure once a day, preferably at the same time every day. Keep a log of this and bring it to your next doctor's appointment.  Bring your blood pressure cuff as well. Return immediately to the ER if you start having chest pain, headache, problems seeing, problems talking, problems walking, if you feel like you're about to pass out, if you do pass out, if you have a seizure, or for any other concerns.  I have written a prescription for a blood pressure monitoring device so hopefully insurance will cover it.  It may be worthwhile to call several different pharmacies and see which one has the most affordable unit.

## 2019-07-08 NOTE — ED Provider Notes (Signed)
HPI  SUBJECTIVE:  Stephen Whitaker is a 63 y.o. male who presents with a blood pressure reading of 160/109 noted today at the drugstore.  Patient checks his blood pressure at the pharmacy.  He does not have a blood pressure cuff at home, so he does not know what his blood pressure has been running since his medications were adjusted on 11/6. Patient was seen by PMD on 11/6.  amlodipine increased from 5 to 10 mg daily, hydrochlorothiazide was discontinued as he has a history of hypokalemia and gout flares.  Was advised to follow-up for blood pressure recheck in 2 weeks.  States the appointment is this Friday.  Contacted his PMD earlier today stating his blood pressure was 160/109, and was sent here.  He reports having a mild headache when he woke up, but it has now resolved.  No seizures, syncope.  It was not the worst headache of his life.  No visual changes, arm or leg weakness, facial droop, slurred speech, syncope, seizures, chest pain, shortness of breath, palpitations, tearing pain through to his back, abdominal pain, hematuria, anuria, change in his baseline lower extremity.  No aggravating or alleviating factors.  He has not tried anything for his symptoms.  He has a past medical history of hypertension, gout, pancreatitis, chronic bronchitis, hypokalemia.  [id number], Stephen Buff, NP   Past Medical History:  Diagnosis Date  . Arthritis   . Boil of buttock   . Chronic bronchitis ([place])   . COPD (chronic obstructive pulmonary disease) ([facility])    Chronic Bronchitis.Advair inhaler   . Dyspnea   . GERD (gastroesophageal reflux disease)    takes Protonix daily  . Gout    takes Allopurinol daily  . Hemorrhoids   . Hypertension    takes HCTZ daily  . Pancreatitis    10 years ago  . Pneumonia 2016  . Tubular adenoma of colon     Past Surgical History:  Procedure Laterality Date  . AMPUTATION Left 07/14/2014   Procedure: 2nd Toe Amputation;  Surgeon: [name], MD;  Location: [facility];   Service: Orthopedics;  Laterality: Left;  . AMPUTATION Left 11/16/2015   Procedure: Left Great Toe Amputation at Metatarsophalangeal Joint;  Surgeon: [name], MD;  Location: [facility];  Service: Orthopedics;  Laterality: Left;  . AMPUTATION Right 06/13/2016   Procedure: RIGHT 2nd Toe Amputation;  Surgeon: [name], MD;  Location: [facility];  Service: Orthopedics;  Laterality: Right;  . AMPUTATION Left 10/19/2016   Procedure: Amputation Left 3rd Toe;  Surgeon: [name], MD;  Location: [facility];  Service: Orthopedics;  Laterality: Left;  . TOE AMPUTATION Left 11/16/2015   left great toe   . TOE AMPUTATION Right 06/13/2016  . TONSILLECTOMY      Family History  Adopted: Yes  Problem Relation Age of Onset  . Cancer Sister 71       unknown origin     Social History   Tobacco Use  . Smoking status: Never Smoker  . Smokeless tobacco: Never Used  Substance Use Topics  . Alcohol use: Yes    Alcohol/week: 0.0 standard drinks    Comment: rarely  . Drug use: No    No current facility-administered medications for this encounter.   Current Outpatient Medications:  .  allopurinol (ZYLOPRIM) 300 MG tablet, Take 1 tablet (300 mg total) by mouth daily., Disp: 60 tablet, Rfl: 3 .  amLODipine (NORVASC) 10 MG tablet, Take 1 tablet (10 mg total) by  mouth daily., Disp: 90 tablet, Rfl: 1 .  Bioflavonoid Products (ESTER-C) TABS, Take 1 tablet by mouth daily., Disp: , Rfl:  .  budesonide-formoterol (SYMBICORT) 160-4.5 MCG/ACT inhaler, Inhale 2 puffs into the lungs 2 (two) times daily., Disp: 3 Inhaler, Rfl: 0 .  montelukast (SINGULAIR) 10 MG tablet, Take 1 tablet (10 mg total) by mouth at bedtime., Disp: 90 tablet, Rfl: 3 .  pantoprazole (PROTONIX) 40 MG tablet, TAKE 30 TO 60 MINUTES BEFORE YOUR FIRST AND LAST MEALS OF THE DAY, Disp: 60 tablet, Rfl: 2 .  SPIRULINA PO, Take 1 capsule by mouth 3 (three) times daily., Disp: , Rfl:  .  Blood Pressure Monitoring DEVI, 1 Units by Does not apply route  daily. Measure blood pressure once a day, Disp: 1 Units, Rfl: 0 .  Colchicine 0.6 MG CAPS, Take 1 capsule by mouth daily for 1 dose., Disp: 6 capsule, Rfl: 0 .  ibuprofen (ADVIL) 800 MG tablet, Take 1 tablet (800 mg total) by mouth every 8 (eight) hours as needed., Disp: 90 tablet, Rfl: 1  Allergies  Allergen Reactions  . Ciprofloxacin Rash and Other (See Comments)    SYNCOPE   . Shellfish Allergy Anaphylaxis  . Sulfa Antibiotics Other (See Comments)    SYNCOPE DIZZINESS      ROS  As noted in HPI.   Physical Exam  BP (!) 159/90 (BP Location: Right Arm) Comment: repositioned arm  Pulse 100   Temp 98.3 F (36.8 C) (Oral)   Resp 18   BP Readings from Last 3 Encounters:  07/08/19 (!) 159/90  06/26/19 (!) 144/87  06/12/19 130/84    Constitutional: Well developed, well nourished, no acute distress Eyes:  EOMI, conjunctiva normal bilaterally HENT: Normocephalic, atraumatic,mucus membranes moist Respiratory: Normal inspiratory effort, lungs clear bilaterally  cardiovascular: Normal rate, regular rhythm no murmurs rubs or gallops GI: nondistended skin: No rash, skin intact Musculoskeletal: 1+ edema bilateral lower extremities.  Patient states that this is unchanged. Neurologic: Alert & oriented x 3, no focal neuro deficits Psychiatric: Speech and behavior appropriate   ED Course   Medications - No data to display  No orders of the defined types were placed in this encounter.   No results found for this or any previous visit (from the past 24 hour(s)). No results found.  ED Clinical Impression  1. Essential hypertension      ED Assessment/Plan  Outside records reviewed.  As noted in HPI.  Pt hypertensive today.  Does not know what his blood pressure has been running since his last visit. Pt has no historical evidence of end organ damage.  While he reported having a mild headache this morning, it has resolved.  He is neurologically intact here.  Patient does  not have a blood pressure cuff at home, so I am hesitant to make any additional changes to his blood pressure regimen based on one data point.  Discussed with patient that if he had a log of his blood pressure would be easier to decide whether he needed adjustment of his medication or not here today.  He is asymptomatic.  He has follow-up with his doctor on Friday.  Advised him to buy a blood pressure cuff, measure it once a day and keep a log of this and bring both the blood pressure cuff and the log to his doctor's visit on Friday.  Discussed MDM, treatment plan, and plan for follow-up with patient. Discussed sn/sx that should prompt return to the ED. patient agrees with plan.  Meds ordered this encounter  Medications  . Blood Pressure Monitoring DEVI    Sig: 1 Units by Does not apply route daily. Measure blood pressure once a day    Dispense:  1 Units    Refill:  0    *This clinic note was created using Lobbyist. Therefore, there may be occasional mistakes despite careful proofreading.   ?    Melynda Ripple, MD 07/08/19 2004

## 2019-07-08 NOTE — Telephone Encounter (Signed)
Can you please write for capsules and [facility] please?

## 2019-07-08 NOTE — Telephone Encounter (Signed)
Glenda from [facility] called in reference to the RX for [place].  She stated that the patient has a plan that calls for alternative medication before prescribing this medication.  CB#[phone].  Thank you.

## 2019-07-09 NOTE — Telephone Encounter (Signed)
Stephen Whitaker I took him off HCTZ recently due to history of electrolyte imbalance (hypokalemia and gout flares). Increased his Amlodipine from 5 to 10 mg daily. Just FYI.

## 2019-07-10 ENCOUNTER — Ambulatory Visit: Payer: BC Managed Care – PPO | Attending: Nurse Practitioner | Admitting: Pharmacist

## 2019-07-10 ENCOUNTER — Encounter: Payer: Self-pay | Admitting: Pharmacist

## 2019-07-10 ENCOUNTER — Other Ambulatory Visit: Payer: Self-pay

## 2019-07-10 VITALS — BP 125/71 | HR 87

## 2019-07-10 DIAGNOSIS — I10 Essential (primary) hypertension: Secondary | ICD-10-CM | POA: Diagnosis not present

## 2019-07-10 DIAGNOSIS — Z0130 Encounter for examination of blood pressure without abnormal findings: Secondary | ICD-10-CM | POA: Diagnosis not present

## 2019-07-10 NOTE — Patient Instructions (Signed)
Thank you for coming to see [place] today.   Blood pressure today is at goal.   Continue taking blood pressure medications as prescribed.   Limiting salt and caffeine, as well as exercising as able for at least 30 minutes for 5 days out of the week, can also help you lower your blood pressure.  Take your blood pressure at home if you are able. Please write down these numbers and bring them to your visits.  If you have any questions about medications, please call me [phone].  Lurena Joiner

## 2019-07-10 NOTE — Progress Notes (Signed)
   S:    PCP: Zelda   Patient arrives in good spirits.    Presents to the clinic for hypertension evaluation, counseling, and management.   Patient was referred and last seen by Primary Care Provider on 06/26/19. BP 144/87 at that visit. Zelda increased amlodipine to 10 mg daily. Of note, HCTZ was discontinued d.t excessive urination.   Patient was seen in the ED for HTN yesterday.  No acute events, no changes to BP medications.   Patient reports adherence with medications.  Current BP Medications include:  Amlodipine 10 mg daily   Antihypertensives tried in the past include: HCTZ (polyuria)  Dietary habits include: pt does not limit salt - admitted to consuming sausage with breakfast several days this week (none today); drinks coffee and soda throughout the day at work Exercise habits include: walks mainly at work  Family / Social history:  - Never smoker - Occasionally uses alcohol  O:  Vitals:   07/10/19 1511  BP: 125/71  Pulse: 87    Home BP readings: has not checked   Last 3 Office BP readings: BP Readings from Last 3 Encounters:  07/10/19 125/71  07/08/19 (!) 159/90  06/26/19 (!) 144/87   BMET    Component Value Date/Time   NA 141 06/26/2019 1035   K 4.0 06/26/2019 1035   CL 105 06/26/2019 1035   CO2 23 06/26/2019 1035   GLUCOSE 100 (H) 06/26/2019 1035   GLUCOSE 97 03/10/2018 1503   BUN 11 06/26/2019 1035   CREATININE 0.92 06/26/2019 1035   CREATININE 0.92 03/10/2018 1503   CREATININE 1.30 (H) 09/20/2016 1440   CALCIUM 9.2 06/26/2019 1035   GFRNONAA 88 06/26/2019 1035   GFRNONAA >60 03/10/2018 1503   GFRNONAA 84 08/10/2014 1023   GFRAA 101 06/26/2019 1035   GFRAA >60 03/10/2018 1503   GFRAA >89 08/10/2014 1023    Renal function: Estimated Creatinine Clearance: 94.3 mL/min (by C-G formula based on SCr of 0.92 mg/dL).  Clinical ASCVD: No  The 10-year ASCVD risk score Mikey Bussing DC Jr., et al., 2013) is: 14.9%   Values used to calculate the score:  Age: 63 years     Sex: Male     Is Non-Hispanic African American: Yes     Diabetic: No     Tobacco smoker: No     Systolic Blood Pressure: 0000000 mmHg     Is BP treated: Yes     HDL Cholesterol: 39 mg/dL     Total Cholesterol: 155 mg/dL   A/P: Hypertension longstanding currently controlled on current medications. BP Goal = <130/80 mmHg. Patient is adherent to medications. I believe his BP was elivated d/t consumption of excess sodium in sausage. He denies eating any today. WE will make plans to recheck in 2-3 weeks.  -Continued current regimen.  -Counseled on lifestyle modifications for blood pressure control including reduced dietary sodium, increased exercise, adequate sleep  Results reviewed and written information provided.   Total time in face-to-face counseling 15 minutes.   F/U Clinic Visit in 2-3 weeks for BP check.    Benard Halsted, PharmD, [place] [phone]

## 2019-07-13 ENCOUNTER — Telehealth: Payer: Self-pay

## 2019-07-13 NOTE — Telephone Encounter (Signed)
Erin entered rx for colchicine caps for only a qty of 6 tablets for a 90 day supply and Maryanne had ordered it as tablets which is not the preferred for [facility]. They will fill either mitigare caps or colchicine tablets advised to change to the tablets and that this will be filled as a 90 day supply with 4 additional refills. [facility]ist with [facility] will process this order. To call with any additional questions.

## 2019-07-30 ENCOUNTER — Other Ambulatory Visit: Payer: Self-pay

## 2019-07-30 ENCOUNTER — Ambulatory Visit (INDEPENDENT_AMBULATORY_CARE_PROVIDER_SITE_OTHER): Payer: BC Managed Care – PPO | Admitting: Orthopedic Surgery

## 2019-07-30 ENCOUNTER — Encounter: Payer: Self-pay | Admitting: Orthopedic Surgery

## 2019-07-30 VITALS — Ht 74.0 in | Wt 199.0 lb

## 2019-07-30 DIAGNOSIS — Z89422 Acquired absence of other left toe(s): Secondary | ICD-10-CM | POA: Diagnosis not present

## 2019-07-30 DIAGNOSIS — L97521 Non-pressure chronic ulcer of other part of left foot limited to breakdown of skin: Secondary | ICD-10-CM

## 2019-07-30 DIAGNOSIS — S98132A Complete traumatic amputation of one left lesser toe, initial encounter: Secondary | ICD-10-CM

## 2019-07-31 ENCOUNTER — Ambulatory Visit: Payer: BC Managed Care – PPO | Admitting: Pharmacist

## 2019-07-31 ENCOUNTER — Encounter: Payer: Self-pay | Admitting: Orthopedic Surgery

## 2019-07-31 NOTE — Progress Notes (Signed)
Office Visit Note   Patient: Stephen Whitaker           Date of Birth: [birth year]           MRN: [id number] Visit Date: 07/30/2019              Requested by: [name], NP [address] [place],  [place] [address] PCP: [name], NP  Chief Complaint  Patient presents with  . Left Foot - Follow-up      HPI: Patient is a 63 year old gentleman who presents for evaluation for Wagner grade 1 ulcer beneath the first metatarsal head left foot.  Patient is status post great toe second toe and third toe amputations.  Patient stands he is still using a protective donut requesting additional donuts.  Assessment & Plan: Visit Diagnoses:  1. Non-pressure chronic ulcer of other part of left foot limited to breakdown of skin ([place])   2. Amputated toe of left foot ([place])     Plan: We will make additional donuts provide him with wound dressing supplies recommended minimizing weightbearing follow-up in 3 weeks.  Follow-Up Instructions: Return in about 3 weeks (around 08/20/2019).   Ortho Exam  Patient is alert, oriented, no adenopathy, well-dressed, normal affect, normal respiratory effort. Examination patient has a good dorsalis pedis pulse there is no redness no cellulitis he does have venous stasis swelling but no signs of infection he has a Wagner grade 1 ulcer beneath the first metatarsal head.  After informed consent a 10 blade knife was used to debride the skin and soft tissue back to healthy viable granulation tissue silver nitrate was used for hemostasis the ulcer is 2 cm in diameter 1 mm deep after debridement.  Imaging: No results found. No images are attached to the encounter.  Labs: Lab Results  Component Value Date   HGBA1C 5.6 08/10/2014   ESRSEDRATE 27 (H) 07/05/2015   ESRSEDRATE 80 (H) 01/21/2015   CRP 1.8 (H) 07/05/2015   LABURIC 5.4 02/19/2018   LABURIC 6.0 08/10/2014   REPTSTATUS 01/22/2015 FINAL 01/21/2015   CULT NO GROWTH Performed at [facility]  01/21/2015     Lab Results  Component Value Date   ALBUMIN 3.9 06/26/2019   ALBUMIN 3.9 10/22/2018   ALBUMIN 4.1 06/23/2018   LABURIC 5.4 02/19/2018   LABURIC 6.0 08/10/2014    Lab Results  Component Value Date   MG 1.8 01/21/2015   Lab Results  Component Value Date   VD25OH 32.4 01/23/2017    No results found for: PREALBUMIN CBC EXTENDED Latest Ref Rng & Units 06/26/2019 10/22/2018 05/26/2018  WBC 3.4 - 10.8 x10E3/uL 2.8(L) 2.5(LL) 4.1  RBC 4.14 - 5.80 x10E6/uL 4.14 4.30 4.29  HGB 13.0 - 17.7 g/dL 11.9(L) 11.7(L) 12.1(L)  HCT 37.5 - 51.0 % 36.5(L) 36.7(L) 37.6(L)  PLT 150 - 450 x10E3/uL 247 219 206.0  NEUTROABS 1.4 - 7.0 x10E3/uL 1.3(L) - 2.6  LYMPHSABS 0.7 - 3.1 x10E3/uL 0.9 - 0.9     Body mass index is 25.55 kg/m.  Orders:  No orders of the defined types were placed in this encounter.  No orders of the defined types were placed in this encounter.    Procedures: No procedures performed  Clinical Data: No additional findings.  ROS:  All other systems negative, except as noted in the HPI. Review of Systems  Objective: Vital Signs: Ht 6\' 2"  (1.88 m)   Wt 199 lb (90.3 kg)   BMI 25.55 kg/m  Specialty Comments:  No specialty comments available.  PMFS History: Patient Active Problem List   Diagnosis Date Noted  . Thrombocytopenia ([place]) 06/26/2019  . Acquired absence of other right toe(s) ([place]) 02/19/2018  . Boil of buttock 10/24/2016  . Amputated toe of left foot ([place]) 10/19/2016  . Amputated toe, right ([place]) 06/13/2016  . Chronic bronchitis ([place]) 11/24/2015  . Other pancytopenia ([place]) 01/24/2015  . Chronic pancreatitis ([place]) 01/24/2015  . Blood poisoning   . Neutropenia ([place])   . Iron deficiency anemia due to chronic blood loss   . Leukopenia   . Sepsis ([place]) 01/21/2015  . Leucopenia 01/21/2015  . Anemia 01/21/2015  . Syncope 01/21/2015  . Diarrhea 01/21/2015  . Hypokalemia 01/21/2015  . Loss of consciousness ([place])   . Eczema  10/21/2014  . Essential hypertension 10/21/2014  . Vasovagal syncope 10/21/2014  . Bilateral conjunctivitis 08/10/2014  . Other chronic pancreatitis ([place]) 08/10/2014  . COLD (chronic obstructive lung disease) ([place]) 08/10/2014  . Chronic ethmoidal sinusitis 08/10/2014  . Bronchitis 10/17/2010  . PLANTAR FASCIITIS, LEFT 10/17/2010  . OTHER NEUTROPENIA 04/21/2010  . CALLUS, TOE 03/15/2010  . LEUKOPENIA, MILD 02/20/2010  . LIVER FUNCTION TESTS, ABNORMAL, HX OF 02/16/2010  . DENTAL CARIES 02/15/2010  . ONYCHOMYCOSIS 07/05/2009  . ALLERGIC URTICARIA 07/05/2009  . PAIN IN LIMB 07/05/2009  . ERECTILE DYSFUNCTION 05/25/2008  . INFLUENZA 05/25/2008  . VIRAL INFECTION 10/01/2007  . HSV 05/02/2007  . CONDYLOMA ACUMINATUM 05/02/2007  . HYPERTENSION 05/02/2007  . ALLERGIC RHINITIS 05/02/2007  . Chronic asthma, mild persistent, uncomplicated Q000111Q  . Gastroesophageal reflux disease without esophagitis 05/02/2007  . PANCREATITIS 05/02/2007   Past Medical History:  Diagnosis Date  . Arthritis   . Boil of buttock   . Chronic bronchitis ([place])   . COPD (chronic obstructive pulmonary disease) ([facility])    Chronic Bronchitis.Advair inhaler   . Dyspnea   . GERD (gastroesophageal reflux disease)    takes Protonix daily  . Gout    takes Allopurinol daily  . Hemorrhoids   . Hypertension    takes HCTZ daily  . Pancreatitis    10 years ago  . Pneumonia 2016  . Tubular adenoma of colon     Family History  Adopted: Yes  Problem Relation Age of Onset  . Cancer Sister 47       unknown origin     Past Surgical History:  Procedure Laterality Date  . AMPUTATION Left 07/14/2014   Procedure: 2nd Toe Amputation;  Surgeon: [name], MD;  Location: [facility];  Service: Orthopedics;  Laterality: Left;  . AMPUTATION Left 11/16/2015   Procedure: Left Great Toe Amputation at Metatarsophalangeal Joint;  Surgeon: [name], MD;  Location: [facility];  Service: Orthopedics;  Laterality: Left;  .  AMPUTATION Right 06/13/2016   Procedure: RIGHT 2nd Toe Amputation;  Surgeon: [name], MD;  Location: [facility];  Service: Orthopedics;  Laterality: Right;  . AMPUTATION Left 10/19/2016   Procedure: Amputation Left 3rd Toe;  Surgeon: [name], MD;  Location: [facility];  Service: Orthopedics;  Laterality: Left;  . TOE AMPUTATION Left 11/16/2015   left great toe   . TOE AMPUTATION Right 06/13/2016  . TONSILLECTOMY     Social History   Occupational History  . Not on file  Tobacco Use  . Smoking status: Never Smoker  . Smokeless tobacco: Never Used  Substance and Sexual Activity  . Alcohol use: Yes    Alcohol/week: 0.0 standard drinks  Comment: rarely  . Drug use: No  . Sexual activity: Not Currently

## 2019-08-20 ENCOUNTER — Ambulatory Visit (INDEPENDENT_AMBULATORY_CARE_PROVIDER_SITE_OTHER): Payer: BC Managed Care – PPO | Admitting: Orthopedic Surgery

## 2019-08-20 ENCOUNTER — Encounter: Payer: Self-pay | Admitting: Orthopedic Surgery

## 2019-08-20 ENCOUNTER — Other Ambulatory Visit: Payer: Self-pay

## 2019-08-20 VITALS — Ht 74.0 in | Wt 199.0 lb

## 2019-08-20 DIAGNOSIS — L97521 Non-pressure chronic ulcer of other part of left foot limited to breakdown of skin: Secondary | ICD-10-CM

## 2019-08-20 NOTE — Progress Notes (Signed)
Office Visit Note   Patient: Stephen Whitaker           Date of Birth: [birth year]           MRN: [id number] Visit Date: 08/20/2019              Requested by: [name], NP [address] [place],   [address] PCP: [name], NP  Chief Complaint  Patient presents with  . Left Foot - Follow-up      HPI: This is a pleasant gentleman we have been following for a ulcer beneath his great toe.  He has been using a doughnut and triple antibiotic ointment he does feel it is improving  Assessment & Plan: Visit Diagnoses: No diagnosis found.  Plan: I cautioned him about getting this too wet.  He will continue using doughnut and follow-up in 4 weeks  Follow-Up Instructions: No follow-ups on file.   Ortho Exam  Patient is alert, oriented, no adenopathy, well-dressed, normal affect, normal respiratory effort. Focused examination demonstrates 3 cm ulcer beneath the great toe there is no surrounding cellulitis there is skin maceration.  With verbal permission I did debride this to softer healthier skin surfaces there is no foul odor or fluctuance it does not probe deep  Imaging: No results found. No images are attached to the encounter.  Labs: Lab Results  Component Value Date   HGBA1C 5.6 08/10/2014   ESRSEDRATE 27 (H) 07/05/2015   ESRSEDRATE 80 (H) 01/21/2015   CRP 1.8 (H) 07/05/2015   LABURIC 5.4 02/19/2018   LABURIC 6.0 08/10/2014   REPTSTATUS 01/22/2015 FINAL 01/21/2015   CULT NO GROWTH Performed at [facility]  01/21/2015     Lab Results  Component Value Date   ALBUMIN 3.9 06/26/2019   ALBUMIN 3.9 10/22/2018   ALBUMIN 4.1 06/23/2018   LABURIC 5.4 02/19/2018   LABURIC 6.0 08/10/2014    Lab Results  Component Value Date   MG 1.8 01/21/2015   Lab Results  Component Value Date   VD25OH 32.4 01/23/2017    No results found for: PREALBUMIN CBC EXTENDED Latest Ref Rng & Units 06/26/2019 10/22/2018 05/26/2018  WBC 3.4 - 10.8 x10E3/uL  2.8(L) 2.5(LL) 4.1  RBC 4.14 - 5.80 x10E6/uL 4.14 4.30 4.29  HGB 13.0 - 17.7 g/dL 11.9(L) 11.7(L) 12.1(L)  HCT 37.5 - 51.0 % 36.5(L) 36.7(L) 37.6(L)  PLT 150 - 450 x10E3/uL 247 219 206.0  NEUTROABS 1.4 - 7.0 x10E3/uL 1.3(L) - 2.6  LYMPHSABS 0.7 - 3.1 x10E3/uL 0.9 - 0.9     Body mass index is 25.55 kg/m.  Orders:  No orders of the defined types were placed in this encounter.  No orders of the defined types were placed in this encounter.    Procedures: No procedures performed  Clinical Data: No additional findings.  ROS:  All other systems negative, except as noted in the HPI. Review of Systems  Objective: Vital Signs: Ht 6\' 2"  (1.88 m)   Wt 199 lb (90.3 kg)   BMI 25.55 kg/m   Specialty Comments:  No specialty comments available.  PMFS History: Patient Active Problem List   Diagnosis Date Noted  . Thrombocytopenia ([place]) 06/26/2019  . Acquired absence of other right toe(s) ([place]) 02/19/2018  . Boil of buttock 10/24/2016  . Amputated toe of left foot ([place]) 10/19/2016  . Amputated toe, right ([place]) 06/13/2016  . Chronic bronchitis ([place]) 11/24/2015  . Other pancytopenia ([place]) 01/24/2015  . Chronic pancreatitis ([place]) 01/24/2015  .  Blood poisoning   . Neutropenia ([place])   . Iron deficiency anemia due to chronic blood loss   . Leukopenia   . Sepsis ([place]) 01/21/2015  . Leucopenia 01/21/2015  . Anemia 01/21/2015  . Syncope 01/21/2015  . Diarrhea 01/21/2015  . Hypokalemia 01/21/2015  . Loss of consciousness ([place])   . Eczema 10/21/2014  . Essential hypertension 10/21/2014  . Vasovagal syncope 10/21/2014  . Bilateral conjunctivitis 08/10/2014  . Other chronic pancreatitis ([place]) 08/10/2014  . COLD (chronic obstructive lung disease) ([place]) 08/10/2014  . Chronic ethmoidal sinusitis 08/10/2014  . Bronchitis 10/17/2010  . PLANTAR FASCIITIS, LEFT 10/17/2010  . OTHER NEUTROPENIA 04/21/2010  . CALLUS, TOE 03/15/2010  . LEUKOPENIA, MILD 02/20/2010  . LIVER FUNCTION  TESTS, ABNORMAL, HX OF 02/16/2010  . DENTAL CARIES 02/15/2010  . ONYCHOMYCOSIS 07/05/2009  . ALLERGIC URTICARIA 07/05/2009  . PAIN IN LIMB 07/05/2009  . ERECTILE DYSFUNCTION 05/25/2008  . INFLUENZA 05/25/2008  . VIRAL INFECTION 10/01/2007  . HSV 05/02/2007  . CONDYLOMA ACUMINATUM 05/02/2007  . HYPERTENSION 05/02/2007  . ALLERGIC RHINITIS 05/02/2007  . Chronic asthma, mild persistent, uncomplicated Q000111Q  . Gastroesophageal reflux disease without esophagitis 05/02/2007  . PANCREATITIS 05/02/2007   Past Medical History:  Diagnosis Date  . Arthritis   . Boil of buttock   . Chronic bronchitis ([place])   . COPD (chronic obstructive pulmonary disease) ([facility])    Chronic Bronchitis.Advair inhaler   . Dyspnea   . GERD (gastroesophageal reflux disease)    takes Protonix daily  . Gout    takes Allopurinol daily  . Hemorrhoids   . Hypertension    takes HCTZ daily  . Pancreatitis    10 years ago  . Pneumonia 2016  . Tubular adenoma of colon     Family History  Adopted: Yes  Problem Relation Age of Onset  . Cancer Sister 51       unknown origin     Past Surgical History:  Procedure Laterality Date  . AMPUTATION Left 07/14/2014   Procedure: 2nd Toe Amputation;  Surgeon: [name], MD;  Location: [facility];  Service: Orthopedics;  Laterality: Left;  . AMPUTATION Left 11/16/2015   Procedure: Left Great Toe Amputation at Metatarsophalangeal Joint;  Surgeon: [name], MD;  Location: [facility];  Service: Orthopedics;  Laterality: Left;  . AMPUTATION Right 06/13/2016   Procedure: RIGHT 2nd Toe Amputation;  Surgeon: [name], MD;  Location: [facility];  Service: Orthopedics;  Laterality: Right;  . AMPUTATION Left 10/19/2016   Procedure: Amputation Left 3rd Toe;  Surgeon: [name], MD;  Location: [facility];  Service: Orthopedics;  Laterality: Left;  . TOE AMPUTATION Left 11/16/2015   left great toe   . TOE AMPUTATION Right 06/13/2016  . TONSILLECTOMY     Social History    Occupational History  . Not on file  Tobacco Use  . Smoking status: Never Smoker  . Smokeless tobacco: Never Used  Substance and Sexual Activity  . Alcohol use: Yes    Alcohol/week: 0.0 standard drinks    Comment: rarely  . Drug use: No  . Sexual activity: Not Currently

## 2019-09-06 ENCOUNTER — Other Ambulatory Visit: Payer: Self-pay | Admitting: Family Medicine

## 2019-09-06 DIAGNOSIS — J42 Unspecified chronic bronchitis: Secondary | ICD-10-CM

## 2019-09-11 ENCOUNTER — Other Ambulatory Visit: Payer: Self-pay

## 2019-09-11 ENCOUNTER — Ambulatory Visit (INDEPENDENT_AMBULATORY_CARE_PROVIDER_SITE_OTHER): Payer: BC Managed Care – PPO | Admitting: Internal Medicine

## 2019-09-11 ENCOUNTER — Encounter: Payer: Self-pay | Admitting: Internal Medicine

## 2019-09-11 DIAGNOSIS — J42 Unspecified chronic bronchitis: Secondary | ICD-10-CM | POA: Diagnosis not present

## 2019-09-11 DIAGNOSIS — J4530 Mild persistent asthma, uncomplicated: Secondary | ICD-10-CM | POA: Diagnosis not present

## 2019-09-11 MED ORDER — BUDESONIDE-FORMOTEROL FUMARATE 160-4.5 MCG/ACT IN AERO: 2.0000 | INHALATION_SPRAY | Freq: Two times a day (BID) | RESPIRATORY_TRACT | 3 refills | Status: DC

## 2019-09-11 MED ORDER — FLUTICASONE PROPIONATE 50 MCG/ACT NA SUSP: 1.0000 | Freq: Two times a day (BID) | NASAL | 3 refills | Status: DC

## 2019-09-11 NOTE — Progress Notes (Signed)
Stephen Whitaker, male    DOB: 1956-08-06     MRN: [id number]     Brief patient profile:  64  yobm never smoker dx as childhood asthma/ rhinitis with chronic symptoms preventing nl activities much better p age 64 then moved to college at [employer] age 74 more symptoms on some form of daily rx much better on advair and daily albterol  but still bothered by year round rhinitis and then when advair ran out breathing worse then changed to  symbicort and better on symbicort but still using saba bid and referred to pulmonary clinic 05/26/2018 by [name]    History of Present Illness  05/26/2018  Pulmonary / new pt eval / Amador Braddy  Chief Complaint  Patient presents with  . Pulmonary Consult    Referred by [name], NP for eval of bronchitis. Pt states he has had issues with bronchitis all of his life. He states he is not currently bothered by a cough or any trouble with breathing.   Dyspnea:  MMRC1 = can walk nl pace, flat grade, can't hurry or go uphills or steps s sob   Cough: none at present though some sense of pnds  Sleep: on side flat bed/ 1-2 pillows SABA use: ventolin avg   twice daily on or off advair or symbicort  rec Plan A = Automatic = Continue Symbicort 160 Take 2 puffs first thing in am and then another 2 puffs about 12 hours later.  Work on inhaler technique:    Plan B = Backup Only use your albuterol (VENTOLIN)   Increase Protonix to Take 30- 60 min before your first and last meals of the day  GERD diet           06/23/2018  f/u ov/Vandy Fong re: asthma/ rhinitis with marked atopy / difficulty affording meds despite insurance / did not bring in meds or formulary  Chief Complaint  Patient presents with  . Follow-up    Breathing is doing better. He is using his rescue inhaler 1-2 x per wk on average.   Dyspnea:  Not limited by breathing from desired activities   Cough: none but some sense of pnds day > noct with globus sensation  Sleeping: flat one pillow SABA use: as  above - much less since started symb 160 vs advair prior  rec Plan A = Automatic = Symbicort 160 Take 2 puffs first thing in am and then another 2 puffs about 12 hours later.  Plan B = Backup Only use your albuterol as a rescue medication  If you cannot access the symbicort then return here in 2 weeks with your drug  formulary in hand (this the information)   to see me or one of my nurse practioners   If happy we can see you in 3 months  Late add:  Change proair respiclick to hfa (inadvertantly called in the dpi form which is likely to irrritate his throat)    02/06/2019  f/u ov/Ondria Oswald re: asthma/ rhinitis  Chief Complaint  Patient presents with  . Follow-up    Breathing is doing well. He is using his rescue inhaler on average once per month. +   Dyspnea:   Fine as long as on symb Cough: none/ still nasal drainage/congestion does not recall whether singulair helped  Sleeping: flat / one pillow SABA use: none while on symbicort  - issue is formulary restriction rec Plan A = Automatic = dulera 200 Take 2 puffs first thing in am  and then another 2 puffs about 12 hours later.  Singulair (montelukast) 10 mg one before sleep for nasal symptoms   Plan B = Backup Only use your albuterol inhaler as a rescue medication  Plan C = Crisis - only use your albuterol nebulizer if you first try Plan B and it fails to help > ok to use the nebulizer up to every 4 hours but if start needing it regularly call for immediate appointment We will call you with referral to allergy > never went      06/12/2019  f/u ov/Evonna Stoltz re: asthma/ rhinitis easily confused with details of care Chief Complaint  Patient presents with  . Follow-up    Breathing is doing well and he states he rarely uses his albuterol. No new co's.    Dyspnea:  Not limited by breathing from desired activities   Cough: nasal drainge/ congestion Sleeping: fine flat  SABA use: rare 02: none  Plan A = Automatic = dulera 200 Take 2 puffs first  thing in am and then another 2 puffs about 12 hours later.  Singulair (montelukast) 10 mg one before sleep for nasal symptoms   Plan B = Backup Only use your albuterol inhaler as a rescue medication Please schedule a follow up visit in 3 months but call sooner if needed  with all medications /inhalers/ solutions in hand so we can verify exactly what you are taking. This includes all medications from all doctors and over the counters Late add:  Error, dulera not covered - he is on symbicort 160 2bid and will be called to correct the avs       Virtual Visit via Telephone Note 09/11/2019   I connected with Stephen Whitaker on 09/11/19 at   1:30 PM EST by telephone and verified that I am speaking with the correct person using two identifiers.   I discussed the limitations, risks, security and privacy concerns of performing an evaluation and management service by telephone and the availability of in person appointments. I also discussed with the patient that there may be a patient responsible charge related to this service. The patient expressed understanding and agreed to proceed.   History of Present Illness: Dyspnea:  Not limited by breathing from desired activities  / regular job strenuous  Cough: no  Sleeping: no SABA use: rarely 02: no   No obvious day to day or daytime variability or assoc excess/ purulent sputum or mucus plugs or hemoptysis or cp or chest tightness, subjective wheeze or overt sinus or hb symptoms.    Also denies any obvious fluctuation of symptoms with weather or environmental changes or other aggravating or alleviating factors except as outlined above.   Meds reviewed/ med reconciliation completed       Observations/Objective: Good phonation, no conversational dyspnea   Assessment and Plan: See problem list for active a/p's   Follow Up Instructions: See avs for instructions unique to this ov which includes revised/ updated med list     I discussed the  assessment and treatment plan with the patient. The patient was provided an opportunity to ask questions and all were answered. The patient agreed with the plan and demonstrated an understanding of the ins moved to college at [employer] age 74 more symptoms on some form of daily rx much better on advair and daily albterol  but still bothered by year round rhinitis and then when advair ran out breathing worse then changed to  symbicort and better on symbicort but still using saba bid and referred to pulmonary clinic 05/26/2018 by [name]    History of Present Illness  05/26/2018  Pulmonary / new pt eval / Amador Braddy  Chief Complaint  Patient presents with  . Pulmonary Consult    Referred by [name], NP for eval of bronchitis. Pt states he has had issues with bronchitis all of his life. He states he is not currently bothered by a cough or any trouble with breathing.   Dyspnea:  MMRC1 = can walk nl pace, flat grade, can't hurry or go uphills or steps s sob   Cough: none at present though some sense of pnds  Sleep: on side flat bed/ 1-2 pillows SABA use: ventolin avg   twice daily on or off advair or symbicort  rec Plan A = Automatic = Continue Symbicort 160 Take 2 puffs first thing in am and then another 2 puffs about 12 hours later.  Work on inhaler technique:    Plan B = Backup Only use your albuterol (VENTOLIN)   Increase Protonix to Take 30- 60 min before your first and last meals of the day  GERD diet           06/23/2018  f/u ov/Vandy Fong re: asthma/ rhinitis with marked atopy / difficulty affording meds despite insurance / did not bring in meds or formulary  Chief Complaint  Patient presents with  . Follow-up    Breathing is doing better. He is using his rescue inhaler 1-2 x per wk on average.   Dyspnea:  Not limited by breathing from desired activities   Cough: none but some sense of pnds day > noct with globus sensation  Sleeping: flat one pillow SABA use: as  above - much less since started symb 160 vs advair prior  rec Plan A = Automatic = Symbicort 160 Take 2 puffs first thing in am and then another 2 puffs about 12 hours later.  Plan B = Backup Only use your albuterol as a rescue medication  If you cannot access the symbicort then return here in 2 weeks with your drug  formulary in hand (this the information)   to see me or one of my nurse practioners   If happy we can see you in 3 months  Late add:  Change proair respiclick to hfa (inadvertantly called in the dpi form which is likely to irrritate his throat)    02/06/2019  f/u ov/Ondria Oswald re: asthma/ rhinitis  Chief Complaint  Patient presents with  . Follow-up    Breathing is doing well. He is using his rescue inhaler on average once per month. +   Dyspnea:   Fine as long as on symb Cough: none/ still nasal drainage/congestion does not recall whether singulair helped  Sleeping: flat / one pillow SABA use: none while on symbicort  - issue is formulary restriction rec Plan A = Automatic = dulera 200 Take 2 puffs first thing in am  and then another 2 puffs about 12 hours later.  Singulair (montelukast) 10 mg one before sleep for nasal symptoms   Plan B = Backup Only use your albuterol inhaler as a rescue medication  Plan C = Crisis - only use your albuterol nebulizer if you first try Plan B and it fails to help > ok to use the nebulizer up to every 4 hours but if start needing it regularly call for immediate appointment We will call you with referral to allergy > never went      06/12/2019  f/u ov/Evonna Stoltz re: asthma/ rhinitis easily confused with details of care Chief Complaint  Patient presents with  . Follow-up    Breathing is doing well and he states he rarely uses his albuterol. No new co's.    Dyspnea:  Not limited by breathing from desired activities   Cough: nasal drainge/ congestion Sleeping: fine flat  SABA use: rare 02: none  Plan A = Automatic = dulera 200 Take 2 puffs first  thing in am and then another 2 puffs about 12 hours later.  Singulair (montelukast) 10 mg one before sleep for nasal symptoms   Plan B = Backup Only use your albuterol inhaler as a rescue medication Please schedule a follow up visit in 3 months but call sooner if needed  with all medications /inhalers/ solutions in hand so we can verify exactly what you are taking. This includes all medications from all doctors and over the counters Late add:  Error, dulera not covered - he is on symbicort 160 2bid and will be called to correct the avs       Virtual Visit via Telephone Note 09/11/2019   I connected with Stephen Whitaker on 09/11/19 at   1:30 PM EST by telephone and verified that I am speaking with the correct person using two identifiers.   I discussed the limitations, risks, security and privacy concerns of performing an evaluation and management service by telephone and the availability of in person appointments. I also discussed with the patient that there may be a patient responsible charge related to this service. The patient expressed understanding and agreed to proceed.   History of Present Illness: Dyspnea:  Not limited by breathing from desired activities  / regular job strenuous  Cough: no  Sleeping: no SABA use: rarely 02: no   No obvious day to day or daytime variability or assoc excess/ purulent sputum or mucus plugs or hemoptysis or cp or chest tightness, subjective wheeze or overt sinus or hb symptoms.    Also denies any obvious fluctuation of symptoms with weather or environmental changes or other aggravating or alleviating factors except as outlined above.   Meds reviewed/ med reconciliation completed       Observations/Objective: Good phonation, no conversational dyspnea   Assessment and Plan: See problem list for active a/p's   Follow Up Instructions: See avs for instructions unique to this ov which includes revised/ updated med list     I discussed the  assessment and treatment plan with the patient. The patient was provided an opportunity to ask questions and all were answered. The patient agreed with the plan and demonstrated an understanding of the instructions.   The patient was advised to call back or seek an in-person evaluation if the symptoms worsen or if the condition fails to improve as anticipated.  I provided 15 minutes of non-face-to-face time during this encounter.   Christinia Gully, MD

## 2019-09-11 NOTE — Patient Instructions (Signed)
No change in medications   Please schedule a follow up visit in 6 months but call sooner if needed  with all medications /inhalers/ solutions in hand so we can verify exactly what you are taking. This includes all medications from all doctors and over the counters   

## 2019-09-11 NOTE — Assessment & Plan Note (Signed)
Onset in childhood Allergy profile 05/26/2018 >  Eos 0.1/  IgE  905  RAST pan pos - Spirometry 05/26/2018  FEV1 2.5 (75%)  Ratio 77 min curvature p am symb 160 x2 puffs  - 05/26/2018   continue symbicort 160 / leave off singulair as says can't afford it on [employer] plan/ submitted [place] and [place] paperwork for symb - 02/06/2019   symbicort 160 (or dulera 200 depending on formulary)  added singulair  Trial - referred to allergy 02/06/2019  "could not connect" - 06/12/2019  After extensive coaching inhaler device,  effectiveness =    95% - 06/12/2019 add singulair for nasal symptoms and blow symbicort out thru the nose   All goals of chronic asthma control met including optimal function and elimination of symptoms with minimal need for rescue therapy.  Contingencies discussed in full including contacting this office immediately if not controlling the symptoms using the rule of two's.     Pt informed of the seriousness of COVID 19 infection as a direct risk to lung health  and safey and to close contacts and should continue to wear a facemask in public and minimize exposure to public locations but especially avoid any area or activity where non-close contacts are not observing distancing or wearing an appropriate face mask.  I strongly recommended vaccine when offered.    >>> f/u in 6 months to avoid unnecessary exposure but when returns advised bring  all meds in hand using a trust but verify approach to confirm accurate Medication  Reconciliation The principal here is that until we are certain that the  patients are doing what we've asked, it makes no sense to ask them to do more.

## 2019-09-18 ENCOUNTER — Ambulatory Visit (INDEPENDENT_AMBULATORY_CARE_PROVIDER_SITE_OTHER): Payer: BC Managed Care – PPO | Admitting: Physician Assistant

## 2019-09-18 ENCOUNTER — Other Ambulatory Visit: Payer: Self-pay

## 2019-09-18 ENCOUNTER — Encounter: Payer: Self-pay | Admitting: Physician Assistant

## 2019-09-18 DIAGNOSIS — L97521 Non-pressure chronic ulcer of other part of left foot limited to breakdown of skin: Secondary | ICD-10-CM | POA: Diagnosis not present

## 2019-09-18 NOTE — Progress Notes (Signed)
Office Visit Note   Patient: Stephen Whitaker           Date of Birth: [birth year]           MRN: [id number] Visit Date: 09/18/2019              Requested by: [name], NP [place],  [place] [address] PCP: [name], NP  No chief complaint on file.     HPI: The patient presents in follow-up today for his ulcer beneath his 1st metatarsal head.Marland Kitchen He has been taping the pad to his foot and putting his compression sock over the pad Assessment & Plan: Visit Diagnoses: No diagnosis found.  Plan: I have explained to him that the best option would be to place the pad beneath his insert this would avoid having tape in contact with his skin and would allow the sock to be in contact with his skin he does not want to do this. He also wanted rolls of Hyperflex tape. I explained to him that  we are in short supply of this particular product right now and I cannot give him an entire roll. I suggested trying local medical supplies with the Internet. We did make him three more doughnuts. He will follow-up in 1 month  Follow-Up Instructions: No follow-ups on file.   Ortho Exam  Patient is alert, oriented, no adenopathy, well-dressed, normal affect, normal respiratory effort. Focused examination of the plantar ulcer beneath his metatarsal head the central open portion is about 2 x 2 cm. It does not probe deeply and is a healthy red granulation tissue. It is surrounded by macerated moist skin. There is no foul odor or no surrounding cellulitis. After obtaining verbal consent I did debride quite a bit of the macerated skin down to bleeding skin and achieved hemostasis with silver nitrate sticks. Again I tried to demonstrate to him the proper way and where to put the doughnut. He understands that continuing to tape against his skin could cause skin breakdown  Imaging: No results found. No images are attached to the encounter.  Labs: Lab Results  Component Value Date   HGBA1C 5.6 08/10/2014   ESRSEDRATE 27 (H) 07/05/2015   ESRSEDRATE 80 (H) 01/21/2015   CRP 1.8 (H) 07/05/2015   LABURIC 5.4 02/19/2018   LABURIC 6.0 08/10/2014   REPTSTATUS 01/22/2015 FINAL 01/21/2015   CULT NO GROWTH Performed at [facility]  01/21/2015     Lab Results  Component Value Date   ALBUMIN 3.9 06/26/2019   ALBUMIN 3.9 10/22/2018   ALBUMIN 4.1 06/23/2018   LABURIC 5.4 02/19/2018   LABURIC 6.0 08/10/2014    Lab Results  Component Value Date   MG 1.8 01/21/2015   Lab Results  Component Value Date   VD25OH 32.4 01/23/2017    No results found for: PREALBUMIN CBC EXTENDED Latest Ref Rng & Units 06/26/2019 10/22/2018 05/26/2018  WBC 3.4 - 10.8 x10E3/uL 2.8(L) 2.5(LL) 4.1  RBC 4.14 - 5.80 x10E6/uL 4.14 4.30 4.29  HGB 13.0 - 17.7 g/dL 11.9(L) 11.7(L) 12.1(L)  HCT 37.5 - 51.0 % 36.5(L) 36.7(L) 37.6(L)  PLT 150 - 450 x10E3/uL 247 219 206.0  NEUTROABS 1.4 - 7.0 x10E3/uL 1.3(L) - 2.6  LYMPHSABS 0.7 - 3.1 x10E3/uL 0.9 - 0.9     There is no height or weight on file to calculate BMI.  Orders:  No orders of the defined types were placed in this encounter.  No orders of the defined types  were placed in this encounter.    Procedures: No procedures performed  Clinical Data: No additional findings.  ROS:  All other systems negative, except as noted in the HPI. Review of Systems  Objective: Vital Signs: There were no vitals taken for this visit.  Specialty Comments:  No specialty comments available.  PMFS History: Patient Active Problem List   Diagnosis Date Noted  . Thrombocytopenia ([place]) 06/26/2019  . Acquired absence of other right toe(s) ([place]) 02/19/2018  . Boil of buttock 10/24/2016  . Amputated toe of left foot ([place]) 10/19/2016  . Amputated toe, right ([place]) 06/13/2016  . Chronic bronchitis ([place]) 11/24/2015  . Other pancytopenia ([place]) 01/24/2015  . Chronic pancreatitis ([place]) 01/24/2015  . Blood poisoning   . Neutropenia ([place])   . Iron  deficiency anemia due to chronic blood loss   . Leukopenia   . Sepsis ([place]) 01/21/2015  . Leucopenia 01/21/2015  . Anemia 01/21/2015  . Syncope 01/21/2015  . Diarrhea 01/21/2015  . Hypokalemia 01/21/2015  . Loss of consciousness ([place])   . Eczema 10/21/2014  . Essential hypertension 10/21/2014  . Vasovagal syncope 10/21/2014  . Bilateral conjunctivitis 08/10/2014  . Other chronic pancreatitis ([place]) 08/10/2014  . COLD (chronic obstructive lung disease) ([place]) 08/10/2014  . Chronic ethmoidal sinusitis 08/10/2014  . Bronchitis 10/17/2010  . PLANTAR FASCIITIS, LEFT 10/17/2010  . OTHER NEUTROPENIA 04/21/2010  . CALLUS, TOE 03/15/2010  . LEUKOPENIA, MILD 02/20/2010  . LIVER FUNCTION TESTS, ABNORMAL, HX OF 02/16/2010  . DENTAL CARIES 02/15/2010  . ONYCHOMYCOSIS 07/05/2009  . ALLERGIC URTICARIA 07/05/2009  . PAIN IN LIMB 07/05/2009  . ERECTILE DYSFUNCTION 05/25/2008  . INFLUENZA 05/25/2008  . VIRAL INFECTION 10/01/2007  . HSV 05/02/2007  . CONDYLOMA ACUMINATUM 05/02/2007  . HYPERTENSION 05/02/2007  . ALLERGIC RHINITIS 05/02/2007  . Chronic asthma, mild persistent, uncomplicated [id number]  . Gastroesophageal reflux disease without esophagitis 05/02/2007  . PANCREATITIS 05/02/2007   Past Medical History:  Diagnosis Date  . Arthritis   . Boil of buttock   . Chronic bronchitis ([place])   . COPD (chronic obstructive pulmonary disease) ([facility])    Chronic Bronchitis.Advair inhaler   . Dyspnea   . GERD (gastroesophageal reflux disease)    takes Protonix daily  . Gout    takes Allopurinol daily  . Hemorrhoids   . Hypertension    takes HCTZ daily  . Pancreatitis    10 years ago  . Pneumonia 2016  . Tubular adenoma of colon     Family History  Adopted: Yes  Problem Relation Age of Onset  . Cancer Sister 13       unknown origin     Past Surgical History:  Procedure Laterality Date  . AMPUTATION Left 07/14/2014   Procedure: 2nd Toe Amputation;  Surgeon: [name], MD;   Location: [facility];  Service: Orthopedics;  Laterality: Left;  . AMPUTATION Left 11/16/2015   Procedure: Left Great Toe Amputation at Metatarsophalangeal Joint;  Surgeon: [name], MD;  Location: [facility];  Service: Orthopedics;  Laterality: Left;  . AMPUTATION Right 06/13/2016   Procedure: RIGHT 2nd Toe Amputation;  Surgeon: [name], MD;  Location: [facility];  Service: Orthopedics;  Laterality: Right;  . AMPUTATION Left 10/19/2016   Procedure: Amputation Left 3rd Toe;  Surgeon: [name], MD;  Location: [facility];  Service: Orthopedics;  Laterality: Left;  . TOE AMPUTATION Left 11/16/2015   left great toe   . TOE AMPUTATION Right 06/13/2016  . TONSILLECTOMY  Social History   Occupational History  . Not on file  Tobacco Use  . Smoking status: Never Smoker  . Smokeless tobacco: Never Used  Substance and Sexual Activity  . Alcohol use: Yes    Alcohol/week: 0.0 standard drinks    Comment: rarely  . Drug use: No  . Sexual activity: Not Currently

## 2019-10-03 ENCOUNTER — Other Ambulatory Visit: Payer: Self-pay | Admitting: Internal Medicine

## 2019-10-03 DIAGNOSIS — K219 Gastro-esophageal reflux disease without esophagitis: Secondary | ICD-10-CM

## 2019-10-22 ENCOUNTER — Encounter: Payer: Self-pay | Admitting: Orthopedic Surgery

## 2019-10-22 ENCOUNTER — Ambulatory Visit (INDEPENDENT_AMBULATORY_CARE_PROVIDER_SITE_OTHER): Payer: BC Managed Care – PPO | Admitting: Orthopedic Surgery

## 2019-10-22 ENCOUNTER — Other Ambulatory Visit: Payer: Self-pay

## 2019-10-22 DIAGNOSIS — L97521 Non-pressure chronic ulcer of other part of left foot limited to breakdown of skin: Secondary | ICD-10-CM

## 2019-10-22 NOTE — Progress Notes (Signed)
Office Visit Note   Patient: Stephen Whitaker           Date of Birth: April 04, 1956           MRN: [id number] Visit Date: 10/22/2019              Requested by: [name], NP [address] [place],  [place] [address] PCP: [name], NP  Chief Complaint  Patient presents with  . Follow-up      HPI: Patient is a 64 year old gentleman who presents in follow-up for Wagner grade 1 ulcer first metatarsal head left foot.  He states he is doing about the same he noticed some drainage and states the wound has  odor if he does not change the dressing for 3 days  Assessment & Plan: Visit Diagnoses:  1. Non-pressure chronic ulcer of other part of left foot limited to breakdown of skin ([place])     Plan: Ulcer was debrided of skin and soft tissue.  Patient was given to felt relieving pads to unload pressure from the metatarsal head.  He was given Band-Aids.  He is to wash this daily with soap and water and minimize weightbearing and place the felt relieving pads and his other shoe wear.  Recommended daily dressing changes.  Follow-Up Instructions: Return in about 3 weeks (around 11/12/2019).   Ortho Exam  Patient is alert, oriented, no adenopathy, well-dressed, normal affect, normal respiratory effort. Examination patient's foot is plantigrade there is no redness no cellulitis he has an ulcer beneath the left first metatarsal head.  After informed consent a 10 blade knife was used to debride the skin and soft tissue back to healthy viable tissue silver nitrate was used for hemostasis there was good petechial bleeding the ulcer did not probe to bone or tendon.  The ulcer is 2 cm in diameter and 2 mm deep.  Imaging: No results found. No images are attached to the encounter.  Labs: Lab Results  Component Value Date   HGBA1C 5.6 08/10/2014   ESRSEDRATE 27 (H) 07/05/2015   ESRSEDRATE 80 (H) 01/21/2015   CRP 1.8 (H) 07/05/2015   LABURIC 5.4 02/19/2018   LABURIC 6.0 08/10/2014   REPTSTATUS 01/22/2015 FINAL 01/21/2015   CULT NO GROWTH Performed at [facility]  01/21/2015     Lab Results  Component Value Date   ALBUMIN 3.9 06/26/2019   ALBUMIN 3.9 10/22/2018   ALBUMIN 4.1 06/23/2018   LABURIC 5.4 02/19/2018   LABURIC 6.0 08/10/2014    Lab Results  Component Value Date   MG 1.8 01/21/2015   Lab Results  Component Value Date   VD25OH 32.4 01/23/2017    No results found for: PREALBUMIN CBC EXTENDED Latest Ref Rng & Units 06/26/2019 10/22/2018 05/26/2018  WBC 3.4 - 10.8 x10E3/uL 2.8(L) 2.5(LL) 4.1  RBC 4.14 - 5.80 x10E6/uL 4.14 4.30 4.29  HGB 13.0 - 17.7 g/dL 11.9(L) 11.7(L) 12.1(L)  HCT 37.5 - 51.0 % 36.5(L) 36.7(L) 37.6(L)  PLT 150 - 450 x10E3/uL 247 219 206.0  NEUTROABS 1.4 - 7.0 x10E3/uL 1.3(L) - 2.6  LYMPHSABS 0.7 - 3.1 x10E3/uL 0.9 - 0.9     There is no height or weight on file to calculate BMI.  Orders:  No orders of the defined types were placed in this encounter.  No orders of the defined types were placed in this encounter.    Procedures: No procedures performed  Clinical Data: No additional findings.  ROS:  All other systems negative, except  as noted in the HPI. Review of Systems  Objective: Vital Signs: There were no vitals taken for this visit.  Specialty Comments:  No specialty comments available.  PMFS History: Patient Active Problem List   Diagnosis Date Noted  . Thrombocytopenia ([place]) 06/26/2019  . Acquired absence of other right toe(s) ([place]) 02/19/2018  . Boil of buttock 10/24/2016  . Amputated toe of left foot ([place]) 10/19/2016  . Amputated toe, right ([place]) 06/13/2016  . Chronic bronchitis ([place]) 11/24/2015  . Other pancytopenia ([place]) 01/24/2015  . Chronic pancreatitis ([employer]) 01/24/2015  . Blood poisoning   . Neutropenia ([place])   . Iron deficiency anemia due to chronic blood loss   . Leukopenia   . Sepsis ([place]) 01/21/2015  . Leucopenia 01/21/2015  . Anemia 01/21/2015  . Syncope 01/21/2015  .  Diarrhea 01/21/2015  . Hypokalemia 01/21/2015  . Loss of consciousness ([place])   . Eczema 10/21/2014  . Essential hypertension 10/21/2014  . Vasovagal syncope 10/21/2014  . Bilateral conjunctivitis 08/10/2014  . Other chronic pancreatitis ([place]) 08/10/2014  . COLD (chronic obstructive lung disease) ([place]) 08/10/2014  . Chronic ethmoidal sinusitis 08/10/2014  . Bronchitis 10/17/2010  . PLANTAR FASCIITIS, LEFT 10/17/2010  . OTHER NEUTROPENIA 04/21/2010  . CALLUS, TOE 03/15/2010  . LEUKOPENIA, MILD 02/20/2010  . LIVER FUNCTION TESTS, ABNORMAL, HX OF 02/16/2010  . DENTAL CARIES 02/15/2010  . ONYCHOMYCOSIS 07/05/2009  . ALLERGIC URTICARIA 07/05/2009  . PAIN IN LIMB 07/05/2009  . ERECTILE DYSFUNCTION 05/25/2008  . INFLUENZA 05/25/2008  . VIRAL INFECTION 10/01/2007  . HSV 05/02/2007  . CONDYLOMA ACUMINATUM 05/02/2007  . HYPERTENSION 05/02/2007  . ALLERGIC RHINITIS 05/02/2007  . Chronic asthma, mild persistent, uncomplicated Q000111Q  . Gastroesophageal reflux disease without esophagitis 05/02/2007  . PANCREATITIS 05/02/2007   Past Medical History:  Diagnosis Date  . Arthritis   . Boil of buttock   . Chronic bronchitis ([place])   . COPD (chronic obstructive pulmonary disease) (HCC)    Chronic Bronchitis.Advair inhaler   . Dyspnea   . GERD (gastroesophageal reflux disease)    takes Protonix daily  . Gout    takes Allopurinol daily  . Hemorrhoids   . Hypertension    takes HCTZ daily  . Pancreatitis    10 years ago  . Pneumonia 2016  . Tubular adenoma of colon     Family History  Adopted: Yes  Problem Relation Age of Onset  . Cancer Sister 47       unknown origin     Past Surgical History:  Procedure Laterality Date  . AMPUTATION Left 07/14/2014   Procedure: 2nd Toe Amputation;  Surgeon: [name], MD;  Location: [facility];  Service: Orthopedics;  Laterality: Left;  . AMPUTATION Left 11/16/2015   Procedure: Left Great Toe Amputation at Metatarsophalangeal Joint;  Surgeon:  [name], MD;  Location: [facility];  Service: Orthopedics;  Laterality: Left;  . AMPUTATION Right 06/13/2016   Procedure: RIGHT 2nd Toe Amputation;  Surgeon: [name], MD;  Location: [facility];  Service: Orthopedics;  Laterality: Right;  . AMPUTATION Left 10/19/2016   Procedure: Amputation Left 3rd Toe;  Surgeon: [name], MD;  Location: [facility];  Service: Orthopedics;  Laterality: Left;  . TOE AMPUTATION Left 11/16/2015   left great toe   . TOE AMPUTATION Right 06/13/2016  . TONSILLECTOMY     Social History   Occupational History  . Not on file  Tobacco Use  . Smoking status: Never Smoker  . Smokeless tobacco: Never  Used  Substance and Sexual Activity  . Alcohol use: Yes    Alcohol/week: 0.0 standard drinks    Comment: rarely  . Drug use: No  . Sexual activity: Not Currently

## 2019-11-10 ENCOUNTER — Other Ambulatory Visit: Payer: Self-pay

## 2019-11-10 ENCOUNTER — Ambulatory Visit: Payer: BC Managed Care – PPO | Admitting: *Deleted

## 2019-11-10 DIAGNOSIS — I10 Essential (primary) hypertension: Secondary | ICD-10-CM

## 2019-11-10 MED ORDER — AMLODIPINE BESYLATE 10 MG PO TABS: 10.0000 mg | ORAL_TABLET | Freq: Every day | ORAL | 1 refills | Status: DC

## 2019-11-10 NOTE — Progress Notes (Signed)
Patient denied any nausea/ vomiting/ dizziness. Patient took medication today. Patient unable to afford BP cuff at [facility].

## 2019-11-10 NOTE — Progress Notes (Signed)
Patient seen nurse visit for blood pressure check. BP normal today. He needed a refill of BP medication and missed visit with Acquanetta Belling Ausdall, RHP and was unable to fill prescription. Agreed to refill medication for 6 months. Pt scheduled to follow-up with PCP regarding chronic LFT elevated and leukopenia.

## 2019-11-19 ENCOUNTER — Ambulatory Visit (INDEPENDENT_AMBULATORY_CARE_PROVIDER_SITE_OTHER): Payer: BC Managed Care – PPO | Admitting: Orthopedic Surgery

## 2019-11-19 ENCOUNTER — Other Ambulatory Visit: Payer: Self-pay

## 2019-11-19 DIAGNOSIS — L97521 Non-pressure chronic ulcer of other part of left foot limited to breakdown of skin: Secondary | ICD-10-CM

## 2019-11-20 ENCOUNTER — Encounter: Payer: Self-pay | Admitting: Orthopedic Surgery

## 2019-11-20 NOTE — Progress Notes (Signed)
Office Visit Note   Patient: Stephen Whitaker           Date of Birth: 03-13-1956           MRN: [id number] Visit Date: 11/19/2019              Requested by: [name], NP [address] [place],  [place] [address] PCP: [name], NP  Chief Complaint  Patient presents with  . Left Foot - Follow-up, Wound Check      HPI: Patient is a 64 year old gentleman with a chronic ulcer left foot first metatarsal head.  Patient states he is doing dressing changes he is noticed drainage and odor.   Assessment & Plan: Visit Diagnoses:  1. Non-pressure chronic ulcer of other part of left foot limited to breakdown of skin ([place])     Plan: Ulcer was debrided of skin and soft tissue pressure offloading was discussed.  Reevaluate in 4 weeks.  Follow-Up Instructions: Return in about 4 weeks (around 12/17/2019).   Ortho Exam  Patient is alert, oriented, no adenopathy, well-dressed, normal affect, normal respiratory effort. Examination patient has palpable pulses there is no redness or cellulitis he has dorsiflexion to neutral after informed consent a 10 blade knife was used to breed the skin and soft tissue back to healthy viable granulation tissue the ulcer is 3 cm in diameter 1 mm deep no exposed bone or tendon a Band-Aid was applied.  Imaging: No results found. No images are attached to the encounter.  Labs: Lab Results  Component Value Date   HGBA1C 5.6 08/10/2014   ESRSEDRATE 27 (H) 07/05/2015   ESRSEDRATE 80 (H) 01/21/2015   CRP 1.8 (H) 07/05/2015   LABURIC 5.4 02/19/2018   LABURIC 6.0 08/10/2014   REPTSTATUS 01/22/2015 FINAL 01/21/2015   CULT NO GROWTH Performed at [facility]  01/21/2015     Lab Results  Component Value Date   ALBUMIN 3.9 06/26/2019   ALBUMIN 3.9 10/22/2018   ALBUMIN 4.1 06/23/2018   LABURIC 5.4 02/19/2018   LABURIC 6.0 08/10/2014    Lab Results  Component Value Date   MG 1.8 01/21/2015   Lab Results  Component Value  Date   VD25OH 32.4 01/23/2017    No results found for: PREALBUMIN CBC EXTENDED Latest Ref Rng & Units 06/26/2019 10/22/2018 05/26/2018  WBC 3.4 - 10.8 x10E3/uL 2.8(L) 2.5(LL) 4.1  RBC 4.14 - 5.80 x10E6/uL 4.14 4.30 4.29  HGB 13.0 - 17.7 g/dL 11.9(L) 11.7(L) 12.1(L)  HCT 37.5 - 51.0 % 36.5(L) 36.7(L) 37.6(L)  PLT 150 - 450 x10E3/uL 247 219 206.0  NEUTROABS 1.4 - 7.0 x10E3/uL 1.3(L) - 2.6  LYMPHSABS 0.7 - 3.1 x10E3/uL 0.9 - 0.9     There is no height or weight on file to calculate BMI.  Orders:  No orders of the defined types were placed in this encounter.  No orders of the defined types were placed in this encounter.    Procedures: No procedures performed  Clinical Data: No additional findings.  ROS:  All other systems negative, except as noted in the HPI. Review of Systems  Objective: Vital Signs: There were no vitals taken for this visit.  Specialty Comments:  No specialty comments available.  PMFS History: Patient Active Problem List   Diagnosis Date Noted  . Thrombocytopenia ([place]) 06/26/2019  . Acquired absence of other right toe(s) ([place]) 02/19/2018  . Boil of buttock 10/24/2016  . Amputated toe of left foot ([place]) 10/19/2016  . Amputated  toe, right ([place]) 06/13/2016  . Chronic bronchitis ([place]) 11/24/2015  . Other pancytopenia ([place]) 01/24/2015  . Chronic pancreatitis ([place]) 01/24/2015  . Blood poisoning   . Neutropenia ([place])   . Iron deficiency anemia due to chronic blood loss   . Leukopenia   . Sepsis ([place]) 01/21/2015  . Leucopenia 01/21/2015  . Anemia 01/21/2015  . Syncope 01/21/2015  . Diarrhea 01/21/2015  . Hypokalemia 01/21/2015  . Loss of consciousness ([place])   . Eczema 10/21/2014  . Essential hypertension 10/21/2014  . Vasovagal syncope 10/21/2014  . Bilateral conjunctivitis 08/10/2014  . Other chronic pancreatitis ([place]) 08/10/2014  . COLD (chronic obstructive lung disease) ([place]) 08/10/2014  . Chronic ethmoidal sinusitis 08/10/2014  . Bronchitis  10/17/2010  . PLANTAR FASCIITIS, LEFT 10/17/2010  . OTHER NEUTROPENIA 04/21/2010  . CALLUS, TOE 03/15/2010  . LEUKOPENIA, MILD 02/20/2010  . LIVER FUNCTION TESTS, ABNORMAL, HX OF 02/16/2010  . DENTAL CARIES 02/15/2010  . ONYCHOMYCOSIS 07/05/2009  . ALLERGIC URTICARIA 07/05/2009  . PAIN IN LIMB 07/05/2009  . ERECTILE DYSFUNCTION 05/25/2008  . INFLUENZA 05/25/2008  . VIRAL INFECTION 10/01/2007  . HSV 05/02/2007  . CONDYLOMA ACUMINATUM 05/02/2007  . HYPERTENSION 05/02/2007  . ALLERGIC RHINITIS 05/02/2007  . Chronic asthma, mild persistent, uncomplicated Q000111Q  . Gastroesophageal reflux disease without esophagitis 05/02/2007  . PANCREATITIS 05/02/2007   Past Medical History:  Diagnosis Date  . Arthritis   . Boil of buttock   . Chronic bronchitis ([place])   . COPD (chronic obstructive pulmonary disease) (HCC)    Chronic Bronchitis.Advair inhaler   . Dyspnea   . GERD (gastroesophageal reflux disease)    takes Protonix daily  . Gout    takes Allopurinol daily  . Hemorrhoids   . Hypertension    takes HCTZ daily  . Pancreatitis    10 years ago  . Pneumonia 2016  . Tubular adenoma of colon     Family History  Adopted: Yes  Problem Relation Age of Onset  . Cancer Sister 60       unknown origin     Past Surgical History:  Procedure Laterality Date  . AMPUTATION Left 07/14/2014   Procedure: 2nd Toe Amputation;  Surgeon: [name], MD;  Location: [facility];  Service: Orthopedics;  Laterality: Left;  . AMPUTATION Left 11/16/2015   Procedure: Left Great Toe Amputation at Metatarsophalangeal Joint;  Surgeon: [name], MD;  Location: [facility];  Service: Orthopedics;  Laterality: Left;  . AMPUTATION Right 06/13/2016   Procedure: RIGHT 2nd Toe Amputation;  Surgeon: [name], MD;  Location: [facility];  Service: Orthopedics;  Laterality: Right;  . AMPUTATION Left 10/19/2016   Procedure: Amputation Left 3rd Toe;  Surgeon: [name], MD;  Location: [facility];  Service: Orthopedics;   Laterality: Left;  . TOE AMPUTATION Left 11/16/2015   left great toe   . TOE AMPUTATION Right 06/13/2016  . TONSILLECTOMY     Social History   Occupational History  . Not on file  Tobacco Use  . Smoking status: Never Smoker  . Smokeless tobacco: Never Used  Substance and Sexual Activity  . Alcohol use: Yes    Alcohol/week: 0.0 standard drinks    Comment: rarely  . Drug use: No  . Sexual activity: Not Currently

## 2019-11-25 ENCOUNTER — Ambulatory Visit: Payer: BC Managed Care – PPO | Admitting: Nurse Practitioner

## 2019-11-25 ENCOUNTER — Ambulatory Visit: Payer: BC Managed Care – PPO | Attending: Nurse Practitioner | Admitting: Nurse Practitioner

## 2019-11-25 ENCOUNTER — Other Ambulatory Visit: Payer: Self-pay

## 2019-12-01 ENCOUNTER — Other Ambulatory Visit: Payer: Self-pay | Admitting: Nurse Practitioner

## 2019-12-01 DIAGNOSIS — D72819 Decreased white blood cell count, unspecified: Secondary | ICD-10-CM

## 2019-12-01 DIAGNOSIS — R7401 Elevation of levels of liver transaminase levels: Secondary | ICD-10-CM

## 2019-12-02 ENCOUNTER — Other Ambulatory Visit: Payer: BC Managed Care – PPO

## 2019-12-17 ENCOUNTER — Ambulatory Visit (INDEPENDENT_AMBULATORY_CARE_PROVIDER_SITE_OTHER): Payer: BC Managed Care – PPO | Admitting: Physician Assistant

## 2019-12-17 ENCOUNTER — Encounter: Payer: Self-pay | Admitting: Orthopedic Surgery

## 2019-12-17 ENCOUNTER — Other Ambulatory Visit: Payer: Self-pay

## 2019-12-17 VITALS — Ht 74.0 in | Wt 199.0 lb

## 2019-12-17 DIAGNOSIS — L97521 Non-pressure chronic ulcer of other part of left foot limited to breakdown of skin: Secondary | ICD-10-CM | POA: Diagnosis not present

## 2019-12-17 NOTE — Progress Notes (Signed)
Office Visit Note   Patient: Stephen Whitaker           Date of Birth: 10-06-1955           MRN: [id number] Visit Date: 12/17/2019              Requested by: [name], NP [address] [place],  [place] [address] PCP: [name], NP  Chief Complaint  Patient presents with  . Left Foot - Follow-up      HPI: This is a pleasant gentleman who is follow-up today for his left forefoot ulcer.  He has been using a large Band-Aid and antibiotic ointment.  He is full weightbearing in a regular shoe however he has a long metatarsal pad and a donut relief pad in the orthotic of the shoe.  He thinks that the donut relief pad needs to be shifted slightly to the side.  Assessment & Plan: Visit Diagnoses: No diagnosis found.  Plan: I have given him a new donut pad metatarsal  pad.  He will continue to do daily dressing change and follow-up in 1 month  Follow-Up Instructions: No follow-ups on file.   Ortho Exam  Patient is alert, oriented, no adenopathy, well-dressed, normal affect, normal respiratory effort. Focused examination demonstrates 3 x 3 cm ulcer does not probe deeply with good granulation tissue no foul odor no purulent drainage no surrounding cellulitis  Imaging: No results found. No images are attached to the encounter.  Labs: Lab Results  Component Value Date   HGBA1C 5.6 08/10/2014   ESRSEDRATE 27 (H) 07/05/2015   ESRSEDRATE 80 (H) 01/21/2015   CRP 1.8 (H) 07/05/2015   LABURIC 5.4 02/19/2018   LABURIC 6.0 08/10/2014   REPTSTATUS 01/22/2015 FINAL 01/21/2015   CULT NO GROWTH Performed at [facility]  01/21/2015     Lab Results  Component Value Date   ALBUMIN 3.9 06/26/2019   ALBUMIN 3.9 10/22/2018   ALBUMIN 4.1 06/23/2018   LABURIC 5.4 02/19/2018   LABURIC 6.0 08/10/2014    Lab Results  Component Value Date   MG 1.8 01/21/2015   Lab Results  Component Value Date   VD25OH 32.4 01/23/2017    No results found for:  PREALBUMIN CBC EXTENDED Latest Ref Rng & Units 06/26/2019 10/22/2018 05/26/2018  WBC 3.4 - 10.8 x10E3/uL 2.8(L) 2.5(LL) 4.1  RBC 4.14 - 5.80 x10E6/uL 4.14 4.30 4.29  HGB 13.0 - 17.7 g/dL 11.9(L) 11.7(L) 12.1(L)  HCT 37.5 - 51.0 % 36.5(L) 36.7(L) 37.6(L)  PLT 150 - 450 x10E3/uL 247 219 206.0  NEUTROABS 1.4 - 7.0 x10E3/uL 1.3(L) - 2.6  LYMPHSABS 0.7 - 3.1 x10E3/uL 0.9 - 0.9     Body mass index is 25.55 kg/m.  Orders:  No orders of the defined types were placed in this encounter.  No orders of the defined types were placed in this encounter.    Procedures: No procedures performed  Clinical Data: No additional findings.  ROS:  All other systems negative, except as noted in the HPI. Review of Systems  Objective: Vital Signs: Ht 6\' 2"  (1.88 m)   Wt 199 lb (90.3 kg)   BMI 25.55 kg/m   Specialty Comments:  No specialty comments available.  PMFS History: Patient Active Problem List   Diagnosis Date Noted  . Thrombocytopenia ([place]) 06/26/2019  . Acquired absence of other right toe(s) ([place]) 02/19/2018  . Boil of buttock 10/24/2016  . Amputated toe of left foot ([place]) 10/19/2016  . Amputated toe, right (  [place]) 06/13/2016  . Chronic bronchitis ([place]) 11/24/2015  . Other pancytopenia ([place]) 01/24/2015  . Chronic pancreatitis ([place]) 01/24/2015  . Blood poisoning   . Neutropenia ([place])   . Iron deficiency anemia due to chronic blood loss   . Leukopenia   . Sepsis ([place]) 01/21/2015  . Leucopenia 01/21/2015  . Anemia 01/21/2015  . Syncope 01/21/2015  . Diarrhea 01/21/2015  . Hypokalemia 01/21/2015  . Loss of consciousness ([place])   . Eczema 10/21/2014  . Essential hypertension 10/21/2014  . Vasovagal syncope 10/21/2014  . Bilateral conjunctivitis 08/10/2014  . Other chronic pancreatitis ([place]) 08/10/2014  . COLD (chronic obstructive lung disease) ([place]) 08/10/2014  . Chronic ethmoidal sinusitis 08/10/2014  . Bronchitis 10/17/2010  . PLANTAR FASCIITIS, LEFT 10/17/2010  . OTHER  NEUTROPENIA 04/21/2010  . CALLUS, TOE 03/15/2010  . LEUKOPENIA, MILD 02/20/2010  . LIVER FUNCTION TESTS, ABNORMAL, HX OF 02/16/2010  . DENTAL CARIES 02/15/2010  . ONYCHOMYCOSIS 07/05/2009  . ALLERGIC URTICARIA 07/05/2009  . PAIN IN LIMB 07/05/2009  . ERECTILE DYSFUNCTION 05/25/2008  . INFLUENZA 05/25/2008  . VIRAL INFECTION 10/01/2007  . HSV 05/02/2007  . CONDYLOMA ACUMINATUM 05/02/2007  . HYPERTENSION 05/02/2007  . ALLERGIC RHINITIS 05/02/2007  . Chronic asthma, mild persistent, uncomplicated Q000111Q  . Gastroesophageal reflux disease without esophagitis 05/02/2007  . PANCREATITIS 05/02/2007   Past Medical History:  Diagnosis Date  . Arthritis   . Boil of buttock   . Chronic bronchitis ([place])   . COPD (chronic obstructive pulmonary disease) (HCC)    Chronic Bronchitis.Advair inhaler   . Dyspnea   . GERD (gastroesophageal reflux disease)    takes Protonix daily  . Gout    takes Allopurinol daily  . Hemorrhoids   . Hypertension    takes HCTZ daily  . Pancreatitis    10 years ago  . Pneumonia 2016  . Tubular adenoma of colon     Family History  Adopted: Yes  Problem Relation Age of Onset  . Cancer Sister 60       unknown origin     Past Surgical History:  Procedure Laterality Date  . AMPUTATION Left 07/14/2014   Procedure: 2nd Toe Amputation;  Surgeon: [name], MD;  Location: [facility];  Service: Orthopedics;  Laterality: Left;  . AMPUTATION Left 11/16/2015   Procedure: Left Great Toe Amputation at Metatarsophalangeal Joint;  Surgeon: [name], MD;  Location: [facility];  Service: Orthopedics;  Laterality: Left;  . AMPUTATION Right 06/13/2016   Procedure: RIGHT 2nd Toe Amputation;  Surgeon: [name], MD;  Location: [facility];  Service: Orthopedics;  Laterality: Right;  . AMPUTATION Left 10/19/2016   Procedure: Amputation Left 3rd Toe;  Surgeon: [name], MD;  Location: [facility];  Service: Orthopedics;  Laterality: Left;  . TOE AMPUTATION Left 11/16/2015    left great toe   . TOE AMPUTATION Right 06/13/2016  . TONSILLECTOMY     Social History   Occupational History  . Not on file  Tobacco Use  . Smoking status: Never Smoker  . Smokeless tobacco: Never Used  Substance and Sexual Activity  . Alcohol use: Yes    Alcohol/week: 0.0 standard drinks    Comment: rarely  . Drug use: No  . Sexual activity: Not Currently

## 2020-01-14 ENCOUNTER — Ambulatory Visit (INDEPENDENT_AMBULATORY_CARE_PROVIDER_SITE_OTHER): Payer: BC Managed Care – PPO | Admitting: Physician Assistant

## 2020-01-14 ENCOUNTER — Ambulatory Visit: Payer: BC Managed Care – PPO | Attending: Internal Medicine

## 2020-01-14 ENCOUNTER — Encounter: Payer: Self-pay | Admitting: Physician Assistant

## 2020-01-14 ENCOUNTER — Other Ambulatory Visit: Payer: Self-pay

## 2020-01-14 DIAGNOSIS — L97521 Non-pressure chronic ulcer of other part of left foot limited to breakdown of skin: Secondary | ICD-10-CM | POA: Diagnosis not present

## 2020-01-14 DIAGNOSIS — Z23 Encounter for immunization: Secondary | ICD-10-CM

## 2020-01-14 NOTE — Progress Notes (Signed)
Office Visit Note   Patient: Stephen Whitaker           Date of Birth: 02/11/1956           MRN: [id number] Visit Date: 01/14/2020              Requested by: [name], NP [address] [place],  [place] [address] PCP: [name], NP  Chief Complaint  Patient presents with  . Left Foot - Pain      HPI: Patient presents in follow-up today for the ulcer beneath his first MTP.  He is wearing a donut relief pad in his shoe which he thinks helped is in need of another 1 of these Use using his VIVE compression stocking and feels it is helpful.  He does still apply Band-Aid with a small amount of mupirocin to the ulcer area Assessment & Plan: Visit Diagnoses: No diagnosis found.  Plan: He will follow up in 1 month.  I have made a new donut pad for his shoe.  Follow-Up Instructions: No follow-ups on file.   Ortho Exam  Patient is alert, oriented, no adenopathy, well-dressed, normal affect, normal respiratory effort. Ulcer measures approximately 3 cm x 1 cm.  There is no foul odor there is no surrounding cellulitis.  He does have thick callusing around the edges after obtaining verbal consent these edges were debrided to a soft skin.  Ulcer after debridement was 3-1/2 cm x 1 cm  Imaging: No results found.   Labs: Lab Results  Component Value Date   HGBA1C 5.6 08/10/2014   ESRSEDRATE 27 (H) 07/05/2015   ESRSEDRATE 80 (H) 01/21/2015   CRP 1.8 (H) 07/05/2015   LABURIC 5.4 02/19/2018   LABURIC 6.0 08/10/2014   REPTSTATUS 01/22/2015 FINAL 01/21/2015   CULT NO GROWTH Performed at [facility]  01/21/2015     Lab Results  Component Value Date   ALBUMIN 3.9 06/26/2019   ALBUMIN 3.9 10/22/2018   ALBUMIN 4.1 06/23/2018   LABURIC 5.4 02/19/2018   LABURIC 6.0 08/10/2014    Lab Results  Component Value Date   MG 1.8 01/21/2015   Lab Results  Component Value Date   VD25OH 32.4 01/23/2017    No results found for: PREALBUMIN CBC EXTENDED Latest  Ref Rng & Units 06/26/2019 10/22/2018 05/26/2018  WBC 3.4 - 10.8 x10E3/uL 2.8(L) 2.5(LL) 4.1  RBC 4.14 - 5.80 x10E6/uL 4.14 4.30 4.29  HGB 13.0 - 17.7 g/dL 11.9(L) 11.7(L) 12.1(L)  HCT 37.5 - 51.0 % 36.5(L) 36.7(L) 37.6(L)  PLT 150 - 450 x10E3/uL 247 219 206.0  NEUTROABS 1.4 - 7.0 x10E3/uL 1.3(L) - 2.6  LYMPHSABS 0.7 - 3.1 x10E3/uL 0.9 - 0.9     There is no height or weight on file to calculate BMI.  Orders:  No orders of the defined types were placed in this encounter.  No orders of the defined types were placed in this encounter.    Procedures: No procedures performed  Clinical Data: No additional findings.  ROS:  All other systems negative, except as noted in the HPI. Review of Systems  Objective: Vital Signs: There were no vitals taken for this visit.  Specialty Comments:  No specialty comments available.  PMFS History: Patient Active Problem List   Diagnosis Date Noted  . Thrombocytopenia ([place]) 06/26/2019  . Acquired absence of other right toe(s) ([place]) 02/19/2018  . Boil of buttock 10/24/2016  . Amputated toe of left foot ([place]) 10/19/2016  . Amputated toe, right (  [place]) 06/13/2016  . Chronic bronchitis ([place]) 11/24/2015  . Other pancytopenia ([place]) 01/24/2015  . Chronic pancreatitis ([place]) 01/24/2015  . Blood poisoning   . Neutropenia ([place])   . Iron deficiency anemia due to chronic blood loss   . Leukopenia   . Sepsis ([place]) 01/21/2015  . Leucopenia 01/21/2015  . Anemia 01/21/2015  . Syncope 01/21/2015  . Diarrhea 01/21/2015  . Hypokalemia 01/21/2015  . Loss of consciousness ([place])   . Eczema 10/21/2014  . Essential hypertension 10/21/2014  . Vasovagal syncope 10/21/2014  . Bilateral conjunctivitis 08/10/2014  . Other chronic pancreatitis ([place]) 08/10/2014  . COLD (chronic obstructive lung disease) ([place]) 08/10/2014  . Chronic ethmoidal sinusitis 08/10/2014  . Bronchitis 10/17/2010  . PLANTAR FASCIITIS, LEFT 10/17/2010  . OTHER NEUTROPENIA 04/21/2010  .  CALLUS, TOE 03/15/2010  . LEUKOPENIA, MILD 02/20/2010  . LIVER FUNCTION TESTS, ABNORMAL, HX OF 02/16/2010  . DENTAL CARIES 02/15/2010  . ONYCHOMYCOSIS 07/05/2009  . ALLERGIC URTICARIA 07/05/2009  . PAIN IN LIMB 07/05/2009  . ERECTILE DYSFUNCTION 05/25/2008  . INFLUENZA 05/25/2008  . VIRAL INFECTION 10/01/2007  . HSV 05/02/2007  . CONDYLOMA ACUMINATUM 05/02/2007  . HYPERTENSION 05/02/2007  . ALLERGIC RHINITIS 05/02/2007  . Chronic asthma, mild persistent, uncomplicated [id number]  . Gastroesophageal reflux disease without esophagitis 05/02/2007  . PANCREATITIS 05/02/2007   Past Medical History:  Diagnosis Date  . Arthritis   . Boil of buttock   . Chronic bronchitis ([place])   . COPD (chronic obstructive pulmonary disease) (HCC)    Chronic Bronchitis.Advair inhaler   . Dyspnea   . GERD (gastroesophageal reflux disease)    takes Protonix daily  . Gout    takes Allopurinol daily  . Hemorrhoids   . Hypertension    takes HCTZ daily  . Pancreatitis    10 years ago  . Pneumonia 2016  . Tubular adenoma of colon     Family History  Adopted: Yes  Problem Relation Age of Onset  . Cancer Sister 97       unknown origin     Past Surgical History:  Procedure Laterality Date  . AMPUTATION Left 07/14/2014   Procedure: 2nd Toe Amputation;  Surgeon: [name], MD;  Location: [place];  Service: Orthopedics;  Laterality: Left;  . AMPUTATION Left 11/16/2015   Procedure: Left Great Toe Amputation at Metatarsophalangeal Joint;  Surgeon: [name], MD;  Location: [facility];  Service: Orthopedics;  Laterality: Left;  . AMPUTATION Right 06/13/2016   Procedure: RIGHT 2nd Toe Amputation;  Surgeon: [name], MD;  Location: [place];  Service: Orthopedics;  Laterality: Right;  . AMPUTATION Left 10/19/2016   Procedure: Amputation Left 3rd Toe;  Surgeon: [name], MD;  Location: [place];  Service: Orthopedics;  Laterality: Left;  . TOE AMPUTATION Left 11/16/2015   left great toe   . TOE  AMPUTATION Right 06/13/2016  . TONSILLECTOMY     Social History   Occupational History  . Not on file  Tobacco Use  . Smoking status: Never Smoker  . Smokeless tobacco: Never Used  Substance and Sexual Activity  . Alcohol use: Yes    Alcohol/week: 0.0 standard drinks    Comment: rarely  . Drug use: No  . Sexual activity: Not Currently

## 2020-01-14 NOTE — Progress Notes (Signed)
   Covid-19 Vaccination Clinic  Name:  Stephen Whitaker    MRN: [id number] DOB: [birth year]  01/14/2020  Stephen Whitaker was observed post Covid-19 immunization for 15 minutes without incident. He was provided with Vaccine Information Sheet and instruction to access the V-Safe system.   Stephen Whitaker was instructed to call 911 with any severe reactions post vaccine: Marland Kitchen Difficulty breathing  . Swelling of face and throat  . A fast heartbeat  . A bad rash all over body  . Dizziness and weakness   Immunizations Administered    Name Date Dose VIS Date Route   Pfizer COVID-19 Vaccine 01/14/2020  2:59 PM 0.3 mL 10/14/2018 Intramuscular   Manufacturer: [place]   Lot: [id number]   [place]: [id number]

## 2020-02-01 ENCOUNTER — Ambulatory Visit: Payer: BC Managed Care – PPO | Admitting: Nurse Practitioner

## 2020-02-11 ENCOUNTER — Ambulatory Visit: Payer: BC Managed Care – PPO | Attending: Internal Medicine

## 2020-02-11 DIAGNOSIS — Z23 Encounter for immunization: Secondary | ICD-10-CM

## 2020-02-11 NOTE — Progress Notes (Signed)
   Covid-19 Vaccination Clinic  Name:  ANANTH FIALLOS    MRN: [id number] DOB: [birth year]  02/11/2020  Mr. Raby was observed post Covid-19 immunization for 15 minutes without incident. He was provided with Vaccine Information Sheet and instruction to access the V-Safe system.   Mr. Pennypacker was instructed to call 911 with any severe reactions post vaccine: Marland Kitchen Difficulty breathing  . Swelling of face and throat  . A fast heartbeat  . A bad rash all over body  . Dizziness and weakness   Immunizations Administered    Name Date Dose VIS Date Route   Moderna COVID-19 Vaccine 02/11/2020  5:49 PM 0.5 mL 07/2019 Intramuscular   Manufacturer: Moderna   Lot: [id number]   [place]: [id number]

## 2020-02-15 ENCOUNTER — Ambulatory Visit: Payer: BC Managed Care – PPO | Admitting: Orthopedic Surgery

## 2020-02-25 ENCOUNTER — Encounter: Payer: Self-pay | Admitting: Orthopedic Surgery

## 2020-02-25 ENCOUNTER — Ambulatory Visit (INDEPENDENT_AMBULATORY_CARE_PROVIDER_SITE_OTHER): Payer: BC Managed Care – PPO | Admitting: Orthopedic Surgery

## 2020-02-25 VITALS — Ht 74.0 in | Wt 199.0 lb

## 2020-02-25 DIAGNOSIS — L97521 Non-pressure chronic ulcer of other part of left foot limited to breakdown of skin: Secondary | ICD-10-CM

## 2020-02-26 ENCOUNTER — Encounter: Payer: Self-pay | Admitting: Orthopedic Surgery

## 2020-02-26 NOTE — Progress Notes (Signed)
Office Visit Note   Patient: Stephen Whitaker           Date of Birth: 04/10/1956           MRN: [id number] Visit Date: 02/25/2020              Requested by: [name], NP [address] [place],  [place] [address] PCP: [name], NP  Chief Complaint  Patient presents with  . Left Foot - Follow-up      HPI: Patient is a 64 year old gentleman who presents in follow-up for venous insufficiency both legs as well as a Wagner grade 1 ulcer beneath his left foot first metatarsal head.  Assessment & Plan: Visit Diagnoses:  1. Non-pressure chronic ulcer of other part of left foot limited to breakdown of skin ([place])     Plan: Ulcer was debrided of skin and soft tissue back to healthy viable tissue a new felt relieving donut was placed in his orthotic to unload the metatarsal head.  Follow-Up Instructions: Return in about 4 weeks (around 03/24/2020).   Ortho Exam  Patient is alert, oriented, no adenopathy, well-dressed, normal affect, normal respiratory effort. Examination patient does have venous swelling but no venous ulcers.  He has a Wagner grade 1 ulcer beneath the first metatarsal head.  After informed consent a 10 blade knife was used to debride the skin and soft tissue back to healthy viable tissue this was touched with silver nitrate the ulcer predebridement was 10 mm in diameter after debridement was 35 mm in diameter and 5 mm deep there is 100% healthy granulation tissue Iodosorb and a Band-Aid was applied I felt donut was placed in his shoe.  Imaging: No results found. No images are attached to the encounter.  Labs: Lab Results  Component Value Date   HGBA1C 5.6 08/10/2014   ESRSEDRATE 27 (H) 07/05/2015   ESRSEDRATE 80 (H) 01/21/2015   CRP 1.8 (H) 07/05/2015   LABURIC 5.4 02/19/2018   LABURIC 6.0 08/10/2014   REPTSTATUS 01/22/2015 FINAL 01/21/2015   CULT NO GROWTH Performed at [facility]  01/21/2015     Lab Results  Component Value  Date   ALBUMIN 3.9 06/26/2019   ALBUMIN 3.9 10/22/2018   ALBUMIN 4.1 06/23/2018   LABURIC 5.4 02/19/2018   LABURIC 6.0 08/10/2014    Lab Results  Component Value Date   MG 1.8 01/21/2015   Lab Results  Component Value Date   VD25OH 32.4 01/23/2017    No results found for: PREALBUMIN CBC EXTENDED Latest Ref Rng & Units 06/26/2019 10/22/2018 05/26/2018  WBC 3.4 - 10.8 x10E3/uL 2.8(L) 2.5(LL) 4.1  RBC 4.14 - 5.80 x10E6/uL 4.14 4.30 4.29  HGB 13.0 - 17.7 g/dL 11.9(L) 11.7(L) 12.1(L)  HCT 37.5 - 51.0 % 36.5(L) 36.7(L) 37.6(L)  PLT 150 - 450 x10E3/uL 247 219 206.0  NEUTROABS 1 - 7 x10E3/uL 1.3(L) - 2.6  LYMPHSABS 0 - 3 x10E3/uL 0.9 - 0.9     Body mass index is 25.55 kg/m.  Orders:  No orders of the defined types were placed in this encounter.  No orders of the defined types were placed in this encounter.    Procedures: No procedures performed  Clinical Data: No additional findings.  ROS:  All other systems negative, except as noted in the HPI. Review of Systems  Objective: Vital Signs: Ht 6\' 2"  (1.88 m)   Wt 199 lb (90.3 kg)   BMI 25.55 kg/m   Specialty Comments:  No specialty  comments available.  PMFS History: Patient Active Problem List   Diagnosis Date Noted  . Thrombocytopenia ([place]) 06/26/2019  . Acquired absence of other right toe(s) ([place]) 02/19/2018  . Boil of buttock 10/24/2016  . Amputated toe of left foot ([place]) 10/19/2016  . Amputated toe, right ([place]) 06/13/2016  . Chronic bronchitis ([place]) 11/24/2015  . Other pancytopenia ([place]) 01/24/2015  . Chronic pancreatitis ([place]) 01/24/2015  . Blood poisoning   . Neutropenia ([place])   . Iron deficiency anemia due to chronic blood loss   . Leukopenia   . Sepsis ([place]) 01/21/2015  . Leucopenia 01/21/2015  . Anemia 01/21/2015  . Syncope 01/21/2015  . Diarrhea 01/21/2015  . Hypokalemia 01/21/2015  . Loss of consciousness ([place])   . Eczema 10/21/2014  . Essential hypertension 10/21/2014  . Vasovagal syncope  10/21/2014  . Bilateral conjunctivitis 08/10/2014  . Other chronic pancreatitis ([place]) 08/10/2014  . COLD (chronic obstructive lung disease) ([place]) 08/10/2014  . Chronic ethmoidal sinusitis 08/10/2014  . Bronchitis 10/17/2010  . PLANTAR FASCIITIS, LEFT 10/17/2010  . OTHER NEUTROPENIA 04/21/2010  . CALLUS, TOE 03/15/2010  . LEUKOPENIA, MILD 02/20/2010  . LIVER FUNCTION TESTS, ABNORMAL, HX OF 02/16/2010  . DENTAL CARIES 02/15/2010  . ONYCHOMYCOSIS 07/05/2009  . ALLERGIC URTICARIA 07/05/2009  . PAIN IN LIMB 07/05/2009  . ERECTILE DYSFUNCTION 05/25/2008  . INFLUENZA 05/25/2008  . VIRAL INFECTION 10/01/2007  . HSV 05/02/2007  . CONDYLOMA ACUMINATUM 05/02/2007  . HYPERTENSION 05/02/2007  . ALLERGIC RHINITIS 05/02/2007  . Chronic asthma, mild persistent, uncomplicated 85/27/7824  . Gastroesophageal reflux disease without esophagitis 05/02/2007  . PANCREATITIS 05/02/2007   Past Medical History:  Diagnosis Date  . Arthritis   . Boil of buttock   . Chronic bronchitis ([place])   . COPD (chronic obstructive pulmonary disease) (HCC)    Chronic Bronchitis.Advair inhaler   . Dyspnea   . GERD (gastroesophageal reflux disease)    takes Protonix daily  . Gout    takes Allopurinol daily  . Hemorrhoids   . Hypertension    takes HCTZ daily  . Pancreatitis    10 years ago  . Pneumonia 2016  . Tubular adenoma of colon     Family History  Adopted: Yes  Problem Relation Age of Onset  . Cancer Sister 63       unknown origin     Past Surgical History:  Procedure Laterality Date  . AMPUTATION Left 07/14/2014   Procedure: 2nd Toe Amputation;  Surgeon: [name], MD;  Location: [facility];  Service: Orthopedics;  Laterality: Left;  . AMPUTATION Left 11/16/2015   Procedure: Left Great Toe Amputation at Metatarsophalangeal Joint;  Surgeon: [name], MD;  Location: [facility];  Service: Orthopedics;  Laterality: Left;  . AMPUTATION Right 06/13/2016   Procedure: RIGHT 2nd Toe Amputation;  Surgeon:  [name], MD;  Location: [facility];  Service: Orthopedics;  Laterality: Right;  . AMPUTATION Left 10/19/2016   Procedure: Amputation Left 3rd Toe;  Surgeon: [name], MD;  Location: [facility];  Service: Orthopedics;  Laterality: Left;  . TOE AMPUTATION Left 11/16/2015   left great toe   . TOE AMPUTATION Right 06/13/2016  . TONSILLECTOMY     Social History   Occupational History  . Not on file  Tobacco Use  . Smoking status: Never Smoker  . Smokeless tobacco: Never Used  Vaping Use  . Vaping Use: Never used  Substance and Sexual Activity  . Alcohol use: Yes    Alcohol/week: 0.0 standard  drinks    Comment: rarely  . Drug use: No  . Sexual activity: Not Currently

## 2020-03-10 ENCOUNTER — Ambulatory Visit: Payer: BC Managed Care – PPO | Admitting: Internal Medicine

## 2020-03-24 ENCOUNTER — Encounter: Payer: Self-pay | Admitting: Physician Assistant

## 2020-03-24 ENCOUNTER — Ambulatory Visit (INDEPENDENT_AMBULATORY_CARE_PROVIDER_SITE_OTHER): Payer: BC Managed Care – PPO | Admitting: Physician Assistant

## 2020-03-24 ENCOUNTER — Other Ambulatory Visit: Payer: Self-pay

## 2020-03-24 VITALS — Ht 74.0 in | Wt 199.0 lb

## 2020-03-24 DIAGNOSIS — L97521 Non-pressure chronic ulcer of other part of left foot limited to breakdown of skin: Secondary | ICD-10-CM

## 2020-03-24 NOTE — Progress Notes (Signed)
Office Visit Note   Patient: Stephen Whitaker           Date of Birth: 1956-05-06           MRN: [id number] Visit Date: 03/24/2020              Requested by: [name], NP [address] [place],  [place] [address] PCP: [name], NP  Chief Complaint  Patient presents with  . Left Foot - Follow-up      HPI: This is a pleasant 64 year old gentleman who comes in for follow-up on his left plantar foot ulcer.  He is full weightbearing and has been using a VIVe compression sock.  He covers the area with a small bandage and some Iodosorb.  He thinks it looks about the same.  He is does not have any concerns today.  Assessment & Plan: Visit Diagnoses: No diagnosis found.  Plan: Patient will continue covering the area.  Should try to offload is much as he can continue to use his sock follow-up in 4 weeks  Follow-Up Instructions: No follow-ups on file.   Ortho Exam  Patient is alert, oriented, no adenopathy, well-dressed, normal affect, normal respiratory effort. Ulcer measures about 1 cm in diameter over the plantar surface of the first MTP.  It is has a good vascular bed.  After verbal permission was obtained the macerated skin around to get was debrided.  No signs of cellulitis or acute infection  Imaging: No results found. No images are attached to the encounter.  Labs: Lab Results  Component Value Date   HGBA1C 5.6 08/10/2014   ESRSEDRATE 27 (H) 07/05/2015   ESRSEDRATE 80 (H) 01/21/2015   CRP 1.8 (H) 07/05/2015   LABURIC 5.4 02/19/2018   LABURIC 6.0 08/10/2014   REPTSTATUS 01/22/2015 FINAL 01/21/2015   CULT NO GROWTH Performed at [facility]  01/21/2015     Lab Results  Component Value Date   ALBUMIN 3.9 06/26/2019   ALBUMIN 3.9 10/22/2018   ALBUMIN 4.1 06/23/2018   LABURIC 5.4 02/19/2018   LABURIC 6.0 08/10/2014    Lab Results  Component Value Date   MG 1.8 01/21/2015   Lab Results  Component Value Date   VD25OH 32.4  01/23/2017    No results found for: PREALBUMIN CBC EXTENDED Latest Ref Rng & Units 06/26/2019 10/22/2018 05/26/2018  WBC 3.4 - 10.8 x10E3/uL 2.8(L) 2.5(LL) 4.1  RBC 4.14 - 5.80 x10E6/uL 4.14 4.30 4.29  HGB 13.0 - 17.7 g/dL 11.9(L) 11.7(L) 12.1(L)  HCT 37.5 - 51.0 % 36.5(L) 36.7(L) 37.6(L)  PLT 150 - 450 x10E3/uL 247 219 206.0  NEUTROABS 1 - 7 x10E3/uL 1.3(L) - 2.6  LYMPHSABS 0 - 3 x10E3/uL 0.9 - 0.9     Body mass index is 25.55 kg/m.  Orders:  No orders of the defined types were placed in this encounter.  No orders of the defined types were placed in this encounter.    Procedures: No procedures performed  Clinical Data: No additional findings.  ROS:  All other systems negative, except as noted in the HPI. Review of Systems  Objective: Vital Signs: Ht 6\' 2"  (1.88 m)   Wt 199 lb (90.3 kg)   BMI 25.55 kg/m   Specialty Comments:  No specialty comments available.  PMFS History: Patient Active Problem List   Diagnosis Date Noted  . Thrombocytopenia ([place]) 06/26/2019  . Acquired absence of other right toe(s) ([place]) 02/19/2018  . Boil of buttock 10/24/2016  . Amputated  toe of left foot ([place]) 10/19/2016  . Amputated toe, right ([place]) 06/13/2016  . Chronic bronchitis ([place]) 11/24/2015  . Other pancytopenia ([place]) 01/24/2015  . Chronic pancreatitis ([place]) 01/24/2015  . Blood poisoning   . Neutropenia ([place])   . Iron deficiency anemia due to chronic blood loss   . Leukopenia   . Sepsis ([place]) 01/21/2015  . Leucopenia 01/21/2015  . Anemia 01/21/2015  . Syncope 01/21/2015  . Diarrhea 01/21/2015  . Hypokalemia 01/21/2015  . Loss of consciousness ([place])   . Eczema 10/21/2014  . Essential hypertension 10/21/2014  . Vasovagal syncope 10/21/2014  . Bilateral conjunctivitis 08/10/2014  . Other chronic pancreatitis ([place]) 08/10/2014  . COLD (chronic obstructive lung disease) ([place]) 08/10/2014  . Chronic ethmoidal sinusitis 08/10/2014  . Bronchitis 10/17/2010  . PLANTAR  FASCIITIS, LEFT 10/17/2010  . OTHER NEUTROPENIA 04/21/2010  . CALLUS, TOE 03/15/2010  . LEUKOPENIA, MILD 02/20/2010  . LIVER FUNCTION TESTS, ABNORMAL, HX OF 02/16/2010  . DENTAL CARIES 02/15/2010  . ONYCHOMYCOSIS 07/05/2009  . ALLERGIC URTICARIA 07/05/2009  . PAIN IN LIMB 07/05/2009  . ERECTILE DYSFUNCTION 05/25/2008  . INFLUENZA 05/25/2008  . VIRAL INFECTION 10/01/2007  . HSV 05/02/2007  . CONDYLOMA ACUMINATUM 05/02/2007  . HYPERTENSION 05/02/2007  . ALLERGIC RHINITIS 05/02/2007  . Chronic asthma, mild persistent, uncomplicated 28/31/5176  . Gastroesophageal reflux disease without esophagitis 05/02/2007  . PANCREATITIS 05/02/2007   Past Medical History:  Diagnosis Date  . Arthritis   . Boil of buttock   . Chronic bronchitis ([place])   . COPD (chronic obstructive pulmonary disease) (HCC)    Chronic Bronchitis.Advair inhaler   . Dyspnea   . GERD (gastroesophageal reflux disease)    takes Protonix daily  . Gout    takes Allopurinol daily  . Hemorrhoids   . Hypertension    takes HCTZ daily  . Pancreatitis    10 years ago  . Pneumonia 2016  . Tubular adenoma of colon     Family History  Adopted: Yes  Problem Relation Age of Onset  . Cancer Sister 50       unknown origin     Past Surgical History:  Procedure Laterality Date  . AMPUTATION Left 07/14/2014   Procedure: 2nd Toe Amputation;  Surgeon: [name], MD;  Location: [place];  Service: Orthopedics;  Laterality: Left;  . AMPUTATION Left 11/16/2015   Procedure: Left Great Toe Amputation at Metatarsophalangeal Joint;  Surgeon: [name], MD;  Location: [place];  Service: Orthopedics;  Laterality: Left;  . AMPUTATION Right 06/13/2016   Procedure: RIGHT 2nd Toe Amputation;  Surgeon: [name], MD;  Location: [facility];  Service: Orthopedics;  Laterality: Right;  . AMPUTATION Left 10/19/2016   Procedure: Amputation Left 3rd Toe;  Surgeon: [name], MD;  Location: [facility];  Service: Orthopedics;  Laterality: Left;  .  TOE AMPUTATION Left 11/16/2015   left great toe   . TOE AMPUTATION Right 06/13/2016  . TONSILLECTOMY     Social History   Occupational History  . Not on file  Tobacco Use  . Smoking status: Never Smoker  . Smokeless tobacco: Never Used  Vaping Use  . Vaping Use: Never used  Substance and Sexual Activity  . Alcohol use: Yes    Alcohol/week: 0.0 standard drinks    Comment: rarely  . Drug use: No  . Sexual activity: Not Currently

## 2020-03-25 ENCOUNTER — Telehealth: Payer: Self-pay | Admitting: Radiology

## 2020-03-25 NOTE — Telephone Encounter (Signed)
error 

## 2020-04-08 ENCOUNTER — Ambulatory Visit (INDEPENDENT_AMBULATORY_CARE_PROVIDER_SITE_OTHER): Payer: BC Managed Care – PPO | Admitting: Internal Medicine

## 2020-04-08 ENCOUNTER — Encounter: Payer: Self-pay | Admitting: Internal Medicine

## 2020-04-08 ENCOUNTER — Other Ambulatory Visit: Payer: Self-pay

## 2020-04-08 DIAGNOSIS — J42 Unspecified chronic bronchitis: Secondary | ICD-10-CM

## 2020-04-08 DIAGNOSIS — J4530 Mild persistent asthma, uncomplicated: Secondary | ICD-10-CM | POA: Diagnosis not present

## 2020-04-08 MED ORDER — BUDESONIDE-FORMOTEROL FUMARATE 160-4.5 MCG/ACT IN AERO
INHALATION_SPRAY | RESPIRATORY_TRACT | 11 refills | Status: DC
Start: 2020-04-08 — End: 2020-11-14

## 2020-04-08 MED ORDER — FLUTICASONE PROPIONATE 50 MCG/ACT NA SUSP: 1.0000 | Freq: Two times a day (BID) | NASAL | 11 refills | Status: DC

## 2020-04-08 NOTE — Assessment & Plan Note (Addendum)
Onset in childhood Allergy profile 05/26/2018 >  Eos 0.1/  IgE  905  RAST pan pos - Spirometry 05/26/2018  FEV1 2.5 (75%)  Ratio 77 min curvature p am symb 160 x2 puffs  - 05/26/2018   continue symbicort 160 / leave off singulair as says can't afford it on [employer] plan/ submitted [place] and [place] paperwork for symb - 02/06/2019   symbicort 160 (or dulera 200 depending on formulary)  added singulair  Trial - referred to allergy 02/06/2019  "could not connect" - 06/12/2019  After extensive coaching inhaler device,  effectiveness =    95% - 06/12/2019 add singulair for nasal symptoms and blow symbicort out thru the nose   All goals of chronic asthma control met including optimal function and elimination of symptoms with minimal need for rescue therapy.  Contingencies discussed in full including contacting this office immediately if not controlling the symptoms using the rule of two's.     Problem is ongoing rhinitis issues and now high atopic burden so rec formal allergy eval and if establishes for longterm rx can return here prn.         Each maintenance medication was reviewed in detail including emphasizing most importantly the difference between maintenance and prns and under what circumstances the prns are to be triggered using an action plan format where appropriate.  Total time for H and P, chart review, counseling, teaching device and generating customized AVS unique to this summary  office visit / charting = 25 min

## 2020-04-08 NOTE — Patient Instructions (Signed)
We will call to set up an appt to see Dr Bruna Potter group to evaluate your allergies   No change in medications    Once you establish with allergy and they are doing your follow up I can just see you as needed

## 2020-04-08 NOTE — Progress Notes (Signed)
Stephen Whitaker, male    DOB: 12/06/1955     MRN: [id number]     Brief patient profile:  64 yobm never smoker dx as childhood asthma/ rhinitis with chronic symptoms preventing nl activities much better p age 64 then moved to college at [employer] age 34 more symptoms on some form of daily rx much better on advair and daily albterol  but still bothered by year round rhinitis and then when advair ran out breathing worse then changed to  symbicort and better on symbicort but still using saba bid and referred to pulmonary clinic 05/26/2018 by Stephen Whitaker    History of Present Illness  05/26/2018  Pulmonary / new pt eval / Stephen Whitaker  Chief Complaint  Patient presents with  . Pulmonary Consult    Referred by Stephen Rankins, NP for eval of bronchitis. Pt states he has had issues with bronchitis all of his life. He states he is not currently bothered by a cough or any trouble with breathing.   Dyspnea:  MMRC1 = can walk nl pace, flat grade, can't hurry or go uphills or steps s sob   Cough: none at present though some sense of pnds  Sleep: on side flat bed/ 1-2 pillows SABA use: ventolin avg   twice daily on or off advair or symbicort  rec Plan A = Automatic = Continue Symbicort 160 Take 2 puffs first thing in am and then another 2 puffs about 12 hours later.  Work on inhaler technique:    Plan B = Backup Only use your albuterol (VENTOLIN)   Increase Protonix to Take 30- 60 min before your first and last meals of the day  GERD diet           06/23/2018  f/u ov/Stephen Whitaker re: asthma/ rhinitis with marked atopy / difficulty affording meds despite insurance / did not bring in meds or formulary  Chief Complaint  Patient presents with  . Follow-up    Breathing is doing better. He is using his rescue inhaler 1-2 x per wk on average.   Dyspnea:  Not limited by breathing from desired activities   Cough: none but some sense of pnds day > noct with globus sensation  Sleeping: flat one pillow SABA use: as  above - much less since started symb 160 vs advair prior  rec Plan A = Automatic = Symbicort 160 Take 2 puffs first thing in am and then another 2 puffs about 12 hours later.  Plan B = Backup Only use your albuterol as a rescue medication  If you cannot access the symbicort then return here in 2 weeks with your drug  formulary in hand (this the information)   to see me or one of my nurse practioners   If happy we can see you in 3 months  Late add:  Change proair respiclick to hfa (inadvertantly called in the dpi form which is likely to irrritate his throat)    02/06/2019  f/u ov/Stephen Whitaker re: asthma/ rhinitis  Chief Complaint  Patient presents with  . Follow-up    Breathing is doing well. He is using his rescue inhaler on average once per month. +   Dyspnea:   Fine as long as on symb Cough: none/ still nasal drainage/congestion does not recall whether singulair helped  Sleeping: flat / one pillow SABA use: none while on symbicort  - issue is formulary restriction rec Plan A = Automatic = dulera 200 Take 2 puffs first thing in am and  then another 2 puffs about 12 hours later.  Singulair (montelukast) 10 mg one before sleep for nasal symptoms   Plan B = Backup Only use your albuterol inhaler as a rescue medication  Plan C = Crisis - only use your albuterol nebulizer if you first try Plan B and it fails to help > ok to use the nebulizer up to every 4 hours but if start needing it regularly call for immediate appointment We will call you with referral to allergy > never went      06/12/2019  f/u ov/Stephen Whitaker re: asthma/ rhinitis easily confused with details of care Chief Complaint  Patient presents with  . Follow-up    Breathing is doing well and he states he rarely uses his albuterol. No new co's.   Dyspnea:  Not limited by breathing from desired activities   Cough: nasal drainge/ congestion Sleeping: fine flat  SABA use: rare 02: none  rec Plan A = Automatic = dulera 200 Take 2 puffs  first thing in am and then another 2 puffs about 12 hours later.  Singulair (montelukast) 10 mg one before sleep for nasal symptoms   Plan B = Backup Only use your albuterol inhaler as a rescue medication   04/08/2020  f/u ov/Stephen Whitaker re: asthma / rhinitis with elevated IgE  maint on singulair/ symbicort 160  Chief Complaint  Patient presents with  . Follow-up    SOB unchanged   Dyspnea:  Bothered by heat does fine in [facility] Cough: no Sleeping: bed flat on side one pillow SABA use: once a day rarely 02: none   No obvious day to day or daytime variability or assoc excess/ purulent sputum or mucus plugs or hemoptysis or cp or chest tightness, subjective wheeze or overt   hb symptoms.   Sleeping  without nocturnal  or early am exacerbation  of respiratory  c/o's or need for noct saba. Also denies any obvious fluctuation of symptoms with weather or environmental changes or other aggravating or alleviating factors except as outlined above   No unusual exposure hx or h/o childhood pna  or knowledge of premature birth.  Current Allergies, Complete Past Medical History, Past Surgical History, Family History, and Social History were reviewed in [employer] record.  ROS  The following are not active complaints unless bolded Hoarseness, sore throat, dysphagia, dental problems, itching, sneezing,  nasal congestion or discharge of excess mucus or purulent secretions, ear ache,   fever, chills, sweats, unintended wt loss or wt gain, classically pleuritic or exertional cp,  orthopnea pnd or arm/hand swelling  or leg swelling, presyncope, palpitations, abdominal pain, anorexia, nausea, vomiting, diarrhea  or change in bowel habits or change in bladder habits, change in stools or change in urine, dysuria, hematuria,  rash, arthralgias, visual complaints, headache, numbness, weakness or ataxia or problems with walking or coordination,  change in mood or  memory.        No outpatient  medications have been marked as taking for the 04/08/20 encounter (Office Visit) with Stephen Rockers, MD.                Objective:     04/08/2020       194  06/12/2019      195  02/06/2019       198   06/23/18 200 lb 12.8 oz (91.1 kg)  06/23/18 209 lb (94.8 kg)  05/26/18 196 lb 9.6 oz (89.2 kg)    amb bm nad  Vital signs reviewed  04/08/2020  - Note at rest 02 sats  95% on RA    HEENT : pt wearing mask not removed for exam due to covid -19 concerns.    NECK :  without JVD/Nodes/TM/ nl carotid upstrokes bilaterally   LUNGS: no acc muscle use,  Nl contour chest which is clear to A and P bilaterally without cough on insp or exp maneuvers   CV:  RRR  no s3 or murmur or increase in P2, and no edema   ABD:  soft and nontender with nl inspiratory excursion in the supine position. No bruits or organomegaly appreciated, bowel sounds nl  MS:  Nl gait/ ext warm without deformities, calf tenderness, cyanosis or clubbing No obvious joint restrictions   SKIN: warm and dry without lesions    NEURO:  alert, approp, nl sensorium with  no motor or cerebellar deficits apparent.       Assessment     Outpatient Encounter Medications as of 04/08/2020  Medication Sig  . allopurinol (ZYLOPRIM) 300 MG tablet Take 1 tablet (300 mg total) by mouth daily.  Marland Kitchen amLODipine (NORVASC) 10 MG tablet Take 1 tablet (10 mg total) by mouth daily.  Marland Kitchen Bioflavonoid Products (ESTER-C) TABS Take 1 tablet by mouth daily.  . Blood Pressure Monitoring DEVI 1 Units by Does not apply route daily. Measure blood pressure once a day  . budesonide-formoterol (SYMBICORT) 160-4.5 MCG/ACT inhaler Take 2 puffs first thing in am and then another 2 puffs about 12 hours later.  . Colchicine 0.6 MG CAPS Take 1 capsule by mouth daily for 1 dose.  . fluticasone (FLONASE) 50 MCG/ACT nasal spray Place 1 spray into both nostrils 2 (two) times daily.  Marland Kitchen ibuprofen (ADVIL) 800 MG tablet Take 1 tablet (800 mg total) by mouth every 8  (eight) hours as needed.  . montelukast (SINGULAIR) 10 MG tablet Take 1 tablet (10 mg total) by mouth at bedtime.  . pantoprazole (PROTONIX) 40 MG tablet TAKE 1 TABLET BY MOUTH 30 TO 60 MINTUES BEFORE  YOUR  FIRST  AND  LAST  MEALS  OF  THE  DAY  . SPIRULINA PO Take 1 capsule by mouth 3 (three) times daily.  Marland Kitchen     Marland Kitchen

## 2020-04-10 ENCOUNTER — Encounter: Payer: Self-pay | Admitting: Internal Medicine

## 2020-04-26 ENCOUNTER — Ambulatory Visit: Payer: BC Managed Care – PPO | Admitting: Orthopedic Surgery

## 2020-05-04 ENCOUNTER — Other Ambulatory Visit: Payer: Self-pay | Admitting: Internal Medicine

## 2020-05-04 DIAGNOSIS — K219 Gastro-esophageal reflux disease without esophagitis: Secondary | ICD-10-CM

## 2020-05-05 ENCOUNTER — Encounter: Payer: Self-pay | Admitting: Orthopedic Surgery

## 2020-05-05 ENCOUNTER — Ambulatory Visit (INDEPENDENT_AMBULATORY_CARE_PROVIDER_SITE_OTHER): Payer: BC Managed Care – PPO | Admitting: Physician Assistant

## 2020-05-05 VITALS — Ht 74.0 in | Wt 194.0 lb

## 2020-05-05 DIAGNOSIS — L97521 Non-pressure chronic ulcer of other part of left foot limited to breakdown of skin: Secondary | ICD-10-CM

## 2020-05-05 NOTE — Progress Notes (Signed)
Office Visit Note   Patient: Stephen Whitaker           Date of Birth: 02-13-1956           MRN: [id number] Visit Date: 05/05/2020              Requested by: [name], NP [address] [place],  [place] [address] PCP: [name], NP  Chief Complaint  Patient presents with  . Left Foot - Follow-up      HPI: This is a pleasant 64 year old gentleman who comes in periodically for debridement of his left foot plantar forefoot ulcer.  He is using Iodosorb and a bandage.  He is also wearing a vive compression sock.  He has no concerns today  Assessment & Plan: Visit Diagnoses: No diagnosis found.  Plan: Continue with daily cleansing.  I made him new donuts for his shoes and a metatarsal pad.  Follow-up in 1 month or sooner if any issues arise  Follow-Up Instructions: No follow-ups on file.   Ortho Exam  Patient is alert, oriented, no adenopathy, well-dressed, normal affect, normal respiratory effort. Left foot: Ulcer measures under the plantar forefoot approximately 2 cm in diameter it is surrounded by callus and macerated skin.  It does have a healthy wound bed with good vascular tissue.  No foul odor no surrounding cellulitis.  After verbal emission was obtained the edges were debrided to a healthy bleeding surface which was coagulated with silver nitrate sticks.  After debridement the wound measured approximately 3-1/2 cm by half a centimeter deep  Imaging: No results found. No images are attached to the encounter.  Labs: Lab Results  Component Value Date   HGBA1C 5.6 08/10/2014   ESRSEDRATE 27 (H) 07/05/2015   ESRSEDRATE 80 (H) 01/21/2015   CRP 1.8 (H) 07/05/2015   LABURIC 5.4 02/19/2018   LABURIC 6.0 08/10/2014   REPTSTATUS 01/22/2015 FINAL 01/21/2015   CULT NO GROWTH Performed at [facility]  01/21/2015     Lab Results  Component Value Date   ALBUMIN 3.9 06/26/2019   ALBUMIN 3.9 10/22/2018   ALBUMIN 4.1 06/23/2018   LABURIC 5.4  02/19/2018   LABURIC 6.0 08/10/2014    Lab Results  Component Value Date   MG 1.8 01/21/2015   Lab Results  Component Value Date   VD25OH 32.4 01/23/2017    No results found for: PREALBUMIN CBC EXTENDED Latest Ref Rng & Units 06/26/2019 10/22/2018 05/26/2018  WBC 3.4 - 10.8 x10E3/uL 2.8(L) 2.5(LL) 4.1  RBC 4.14 - 5.80 x10E6/uL 4.14 4.30 4.29  HGB 13.0 - 17.7 g/dL 11.9(L) 11.7(L) 12.1(L)  HCT 37.5 - 51.0 % 36.5(L) 36.7(L) 37.6(L)  PLT 150 - 450 x10E3/uL 247 219 206.0  NEUTROABS 1 - 7 x10E3/uL 1.3(L) - 2.6  LYMPHSABS 0 - 3 x10E3/uL 0.9 - 0.9     Body mass index is 24.91 kg/m.  Orders:  No orders of the defined types were placed in this encounter.  No orders of the defined types were placed in this encounter.    Procedures: No procedures performed  Clinical Data: No additional findings.  ROS:  All other systems negative, except as noted in the HPI. Review of Systems  Objective: Vital Signs: Ht 6\' 2"  (1.88 m)   Wt 194 lb (88 kg)   BMI 24.91 kg/m   Specialty Comments:  No specialty comments available.  PMFS History: Patient Active Problem List   Diagnosis Date Noted  . Thrombocytopenia ([place]) 06/26/2019  .  Acquired absence of other right toe(s) ([place]) 02/19/2018  . Boil of buttock 10/24/2016  . Amputated toe of left foot ([place]) 10/19/2016  . Amputated toe, right ([place]) 06/13/2016  . Chronic bronchitis ([place]) 11/24/2015  . Other pancytopenia ([place]) 01/24/2015  . Chronic pancreatitis ([place]) 01/24/2015  . Blood poisoning   . Neutropenia ([place])   . Iron deficiency anemia due to chronic blood loss   . Leukopenia   . Sepsis ([place]) 01/21/2015  . Leucopenia 01/21/2015  . Anemia 01/21/2015  . Syncope 01/21/2015  . Diarrhea 01/21/2015  . Hypokalemia 01/21/2015  . Loss of consciousness ([place])   . Eczema 10/21/2014  . Essential hypertension 10/21/2014  . Vasovagal syncope 10/21/2014  . Bilateral conjunctivitis 08/10/2014  . Other chronic pancreatitis ([place]) 08/10/2014    . COLD (chronic obstructive lung disease) ([place]) 08/10/2014  . Chronic ethmoidal sinusitis 08/10/2014  . Bronchitis 10/17/2010  . PLANTAR FASCIITIS, LEFT 10/17/2010  . OTHER NEUTROPENIA 04/21/2010  . CALLUS, TOE 03/15/2010  . LEUKOPENIA, MILD 02/20/2010  . LIVER FUNCTION TESTS, ABNORMAL, HX OF 02/16/2010  . DENTAL CARIES 02/15/2010  . ONYCHOMYCOSIS 07/05/2009  . ALLERGIC URTICARIA 07/05/2009  . PAIN IN LIMB 07/05/2009  . ERECTILE DYSFUNCTION 05/25/2008  . INFLUENZA 05/25/2008  . VIRAL INFECTION 10/01/2007  . HSV 05/02/2007  . CONDYLOMA ACUMINATUM 05/02/2007  . HYPERTENSION 05/02/2007  . ALLERGIC RHINITIS 05/02/2007  . Chronic asthma, mild persistent, uncomplicated 96/78/9381  . Gastroesophageal reflux disease without esophagitis 05/02/2007  . PANCREATITIS 05/02/2007   Past Medical History:  Diagnosis Date  . Arthritis   . Boil of buttock   . Chronic bronchitis ([place])   . COPD (chronic obstructive pulmonary disease) (H[facility])    Chronic Bronchitis.Advair inhaler   . Dyspnea   . GERD (gastroesophageal reflux disease)    takes Protonix daily  . Gout    takes Allopurinol daily  . Hemorrhoids   . Hypertension    takes HCTZ daily  . Pancreatitis    10 years ago  . Pneumonia 2016  . Tubular adenoma of colon     Family History  Adopted: Yes  Problem Relation Age of Onset  . Cancer Sister 54       unknown origin     Past Surgical History:  Procedure Laterality Date  . AMPUTATION Left 07/14/2014   Procedure: 2nd Toe Amputation;  Surgeon: [name], MD;  Location: [facility];  Service: Orthopedics;  Laterality: Left;  . AMPUTATION Left 11/16/2015   Procedure: Left Great Toe Amputation at Metatarsophalangeal Joint;  Surgeon: [name], MD;  Location: [facility];  Service: Orthopedics;  Laterality: Left;  . AMPUTATION Right 06/13/2016   Procedure: RIGHT 2nd Toe Amputation;  Surgeon: [name], MD;  Location: [facility];  Service: Orthopedics;  Laterality: Right;  . AMPUTATION Left  10/19/2016   Procedure: Amputation Left 3rd Toe;  Surgeon: [name], MD;  Location: [facility];  Service: Orthopedics;  Laterality: Left;  . TOE AMPUTATION Left 11/16/2015   left great toe   . TOE AMPUTATION Right 06/13/2016  . TONSILLECTOMY     Social History   Occupational History  . Not on file  Tobacco Use  . Smoking status: Never Smoker  . Smokeless tobacco: Never Used  Vaping Use  . Vaping Use: Never used  Substance and Sexual Activity  . Alcohol use: Yes    Alcohol/week: 0.0 standard drinks    Comment: rarely  . Drug use: No  . Sexual activity: Not Currently

## 2020-06-02 ENCOUNTER — Encounter: Payer: Self-pay | Admitting: Orthopedic Surgery

## 2020-06-02 ENCOUNTER — Ambulatory Visit (INDEPENDENT_AMBULATORY_CARE_PROVIDER_SITE_OTHER): Payer: BC Managed Care – PPO | Admitting: Physician Assistant

## 2020-06-02 VITALS — Ht 74.0 in | Wt 194.0 lb

## 2020-06-02 DIAGNOSIS — S98132A Complete traumatic amputation of one left lesser toe, initial encounter: Secondary | ICD-10-CM | POA: Diagnosis not present

## 2020-06-02 NOTE — Progress Notes (Signed)
Office Visit Note   Patient: Stephen Whitaker           Date of Birth: [birth year]           MRN: [id number] Visit Date: 06/02/2020              Requested by: [name], NP [address] [place],  [place] [address] PCP: [name], NP  Chief Complaint  Patient presents with  . Left Foot - Follow-up      HPI: Patient presents today in follow-up for his left foot ulcer.  He continues to do fairly well.  He is wearing his sock as requested.  Assessment & Plan: Visit Diagnoses: No diagnosis found.  Plan: Continue current treatment.  He would benefit from some arch supports giving the planovalgus collapse of his foot.  Follow-Up Instructions: No follow-ups on file.   Ortho Exam  Patient is alert, oriented, no adenopathy, well-dressed, normal affect, normal respiratory effort. Focused examination demonstrates ulcer beneath 1st mtp joint that measures about 4 cm in diameter and about 2 cm deep does not probe deeply.  The base of the ulcer has good vascular tissue.  No surrounding cellulitis or foul odor.  After obtaining verbal consent the callus surrounding it was debrided to a healthy bleeding surface.  Silver nitrate stick was applied  Imaging: No results found. No images are attached to the encounter.  Labs: Lab Results  Component Value Date   HGBA1C 5.6 08/10/2014   ESRSEDRATE 27 (H) 07/05/2015   ESRSEDRATE 80 (H) 01/21/2015   CRP 1.8 (H) 07/05/2015   LABURIC 5.4 02/19/2018   LABURIC 6.0 08/10/2014   REPTSTATUS 01/22/2015 FINAL 01/21/2015   CULT NO GROWTH Performed at [facility]  01/21/2015     Lab Results  Component Value Date   ALBUMIN 3.9 06/26/2019   ALBUMIN 3.9 10/22/2018   ALBUMIN 4.1 06/23/2018   LABURIC 5.4 02/19/2018   LABURIC 6.0 08/10/2014    Lab Results  Component Value Date   MG 1.8 01/21/2015   Lab Results  Component Value Date   VD25OH 32.4 01/23/2017    No results found for: PREALBUMIN CBC EXTENDED Latest  Ref Rng & Units 06/26/2019 10/22/2018 05/26/2018  WBC 3.4 - 10.8 x10E3/uL 2.8(L) 2.5(LL) 4.1  RBC 4.14 - 5.80 x10E6/uL 4.14 4.30 4.29  HGB 13.0 - 17.7 g/dL 11.9(L) 11.7(L) 12.1(L)  HCT 37.5 - 51.0 % 36.5(L) 36.7(L) 37.6(L)  PLT 150 - 450 x10E3/uL 247 219 206.0  NEUTROABS 1 - 7 x10E3/uL 1.3(L) - 2.6  LYMPHSABS 0 - 3 x10E3/uL 0.9 - 0.9     Body mass index is 24.91 kg/m.  Orders:  No orders of the defined types were placed in this encounter.  No orders of the defined types were placed in this encounter.    Procedures: No procedures performed  Clinical Data: No additional findings.  ROS:  All other systems negative, except as noted in the HPI. Review of Systems  Objective: Vital Signs: Ht 6\' 2"  (1.88 m)   Wt 194 lb (88 kg)   BMI 24.91 kg/m   Specialty Comments:  No specialty comments available.  PMFS History: Patient Active Problem List   Diagnosis Date Noted  . Thrombocytopenia ([place]) 06/26/2019  . Acquired absence of other right toe(s) ([place]) 02/19/2018  . Boil of buttock 10/24/2016  . Amputated toe of left foot ([place]) 10/19/2016  . Amputated toe, right ([place]) 06/13/2016  . Chronic bronchitis ([place]) 11/24/2015  . Other pancytopenia (  [place]) 01/24/2015  . Chronic pancreatitis ([place]) 01/24/2015  . Blood poisoning   . Neutropenia ([place])   . Iron deficiency anemia due to chronic blood loss   . Leukopenia   . Sepsis ([place]) 01/21/2015  . Leucopenia 01/21/2015  . Anemia 01/21/2015  . Syncope 01/21/2015  . Diarrhea 01/21/2015  . Hypokalemia 01/21/2015  . Loss of consciousness ([place])   . Eczema 10/21/2014  . Essential hypertension 10/21/2014  . Vasovagal syncope 10/21/2014  . Bilateral conjunctivitis 08/10/2014  . Other chronic pancreatitis ([place]) 08/10/2014  . COLD (chronic obstructive lung disease) ([place]) 08/10/2014  . Chronic ethmoidal sinusitis 08/10/2014  . Bronchitis 10/17/2010  . PLANTAR FASCIITIS, LEFT 10/17/2010  . OTHER NEUTROPENIA 04/21/2010  . CALLUS, TOE  03/15/2010  . LEUKOPENIA, MILD 02/20/2010  . LIVER FUNCTION TESTS, ABNORMAL, HX OF 02/16/2010  . DENTAL CARIES 02/15/2010  . ONYCHOMYCOSIS 07/05/2009  . ALLERGIC URTICARIA 07/05/2009  . PAIN IN LIMB 07/05/2009  . ERECTILE DYSFUNCTION 05/25/2008  . INFLUENZA 05/25/2008  . VIRAL INFECTION 10/01/2007  . HSV 05/02/2007  . CONDYLOMA ACUMINATUM 05/02/2007  . HYPERTENSION 05/02/2007  . ALLERGIC RHINITIS 05/02/2007  . Chronic asthma, mild persistent, uncomplicated 96/78/9381  . Gastroesophageal reflux disease without esophagitis 05/02/2007  . PANCREATITIS 05/02/2007   Past Medical History:  Diagnosis Date  . Arthritis   . Boil of buttock   . Chronic bronchitis ([place])   . COPD (chronic obstructive pulmonary disease) (H[facility])    Chronic Bronchitis.Advair inhaler   . Dyspnea   . GERD (gastroesophageal reflux disease)    takes Protonix daily  . Gout    takes Allopurinol daily  . Hemorrhoids   . Hypertension    takes HCTZ daily  . Pancreatitis    10 years ago  . Pneumonia 2016  . Tubular adenoma of colon     Family History  Adopted: Yes  Problem Relation Age of Onset  . Cancer Sister 79       unknown origin     Past Surgical History:  Procedure Laterality Date  . AMPUTATION Left 07/14/2014   Procedure: 2nd Toe Amputation;  Surgeon: [name], MD;  Location: [facility];  Service: Orthopedics;  Laterality: Left;  . AMPUTATION Left 11/16/2015   Procedure: Left Great Toe Amputation at Metatarsophalangeal Joint;  Surgeon: [name], MD;  Location: [facility];  Service: Orthopedics;  Laterality: Left;  . AMPUTATION Right 06/13/2016   Procedure: RIGHT 2nd Toe Amputation;  Surgeon: [name], MD;  Location: [facility];  Service: Orthopedics;  Laterality: Right;  . AMPUTATION Left 10/19/2016   Procedure: Amputation Left 3rd Toe;  Surgeon: [name], MD;  Location: [facility];  Service: Orthopedics;  Laterality: Left;  . TOE AMPUTATION Left 11/16/2015   left great toe   . TOE AMPUTATION Right  06/13/2016  . TONSILLECTOMY     Social History   Occupational History  . Not on file  Tobacco Use  . Smoking status: Never Smoker  . Smokeless tobacco: Never Used  Vaping Use  . Vaping Use: Never used  Substance and Sexual Activity  . Alcohol use: Yes    Alcohol/week: 0.0 standard drinks    Comment: rarely  . Drug use: No  . Sexual activity: Not Currently

## 2020-06-07 ENCOUNTER — Ambulatory Visit: Payer: BC Managed Care – PPO | Admitting: Allergy and Immunology

## 2020-06-27 ENCOUNTER — Other Ambulatory Visit: Payer: Self-pay | Admitting: Internal Medicine

## 2020-06-30 ENCOUNTER — Ambulatory Visit: Payer: BC Managed Care – PPO | Admitting: Orthopedic Surgery

## 2020-06-30 ENCOUNTER — Encounter: Payer: Self-pay | Admitting: Orthopedic Surgery

## 2020-06-30 ENCOUNTER — Ambulatory Visit (INDEPENDENT_AMBULATORY_CARE_PROVIDER_SITE_OTHER): Payer: BC Managed Care – PPO | Admitting: Orthopedic Surgery

## 2020-06-30 VITALS — Ht 74.0 in | Wt 194.0 lb

## 2020-06-30 DIAGNOSIS — L97521 Non-pressure chronic ulcer of other part of left foot limited to breakdown of skin: Secondary | ICD-10-CM | POA: Diagnosis not present

## 2020-06-30 NOTE — Progress Notes (Signed)
Office Visit Note   Patient: Stephen Whitaker           Date of Birth: [birth year]           MRN: [id number] Visit Date: 06/30/2020              Requested by: [name], NP [address] [place],  [place] [address] PCP: [name], NP  Chief Complaint  Patient presents with  . Left Foot - Follow-up      HPI: Patient is a 64 year old gentleman with venous swelling of the left leg status post great toe amputation with a Wagner grade 1 ulcer beneath the first metatarsal head.  Patient has no complaints.  Assessment & Plan: Visit Diagnoses:  1. Non-pressure chronic ulcer of other part of left foot limited to breakdown of skin ([place])     Plan: Ulcer was debrided of skin and soft tissue back to bleeding viable healthy granulation tissue patient was given a new double extra-large compression sock he was given Band-Aids and tape.  Follow-Up Instructions: No follow-ups on file.   Ortho Exam  Patient is alert, oriented, no adenopathy, well-dressed, normal affect, normal respiratory effort. Examination patient has pitting edema with venous swelling but no dermatitis no open ulcers.  He has increased nonviable tissue around the [facility] grade 1 ulcer of the left first metatarsal head.  After informed consent a 10 blade knife was used to debride the skin and soft tissue back to healthy viable granulation tissue this was touched with silver nitrate for hemostasis the ulcer was 2 cm in diameter prior to debridement 3 cm in diameter after debridement and 5 mm deep.  Imaging: No results found. No images are attached to the encounter.  Labs: Lab Results  Component Value Date   HGBA1C 5.6 08/10/2014   ESRSEDRATE 27 (H) 07/05/2015   ESRSEDRATE 80 (H) 01/21/2015   CRP 1.8 (H) 07/05/2015   LABURIC 5.4 02/19/2018   LABURIC 6.0 08/10/2014   REPTSTATUS 01/22/2015 FINAL 01/21/2015   CULT NO GROWTH Performed at [facility]  01/21/2015     Lab Results  Component  Value Date   ALBUMIN 3.9 06/26/2019   ALBUMIN 3.9 10/22/2018   ALBUMIN 4.1 06/23/2018   LABURIC 5.4 02/19/2018   LABURIC 6.0 08/10/2014    Lab Results  Component Value Date   MG 1.8 01/21/2015   Lab Results  Component Value Date   VD25OH 32.4 01/23/2017    No results found for: PREALBUMIN CBC EXTENDED Latest Ref Rng & Units 06/26/2019 10/22/2018 05/26/2018  WBC 3.4 - 10.8 x10E3/uL 2.8(L) 2.5(LL) 4.1  RBC 4.14 - 5.80 x10E6/uL 4.14 4.30 4.29  HGB 13.0 - 17.7 g/dL 11.9(L) 11.7(L) 12.1(L)  HCT 37.5 - 51.0 % 36.5(L) 36.7(L) 37.6(L)  PLT 150 - 450 x10E3/uL 247 219 206.0  NEUTROABS 1.40 - 7.00 x10E3/uL 1.3(L) - 2.6  LYMPHSABS 0 - 3 x10E3/uL 0.9 - 0.9     Body mass index is 24.91 kg/m.  Orders:  No orders of the defined types were placed in this encounter.  No orders of the defined types were placed in this encounter.    Procedures: No procedures performed  Clinical Data: No additional findings.  ROS:  All other systems negative, except as noted in the HPI. Review of Systems  Objective: Vital Signs: Ht 6\' 2"  (1.88 m)   Wt 194 lb (88 kg)   BMI 24.91 kg/m   Specialty Comments:  No specialty comments available.  [place]  History: Patient Active Problem List   Diagnosis Date Noted  . Thrombocytopenia ([place]) 06/26/2019  . Acquired absence of other right toe(s) ([place]) 02/19/2018  . Boil of buttock 10/24/2016  . Amputated toe of left foot ([place]) 10/19/2016  . Amputated toe, right ([place]) 06/13/2016  . Chronic bronchitis ([place]) 11/24/2015  . Other pancytopenia ([place]) 01/24/2015  . Chronic pancreatitis ([place]) 01/24/2015  . Blood poisoning   . Neutropenia ([place])   . Iron deficiency anemia due to chronic blood loss   . Leukopenia   . Sepsis ([place]) 01/21/2015  . Leucopenia 01/21/2015  . Anemia 01/21/2015  . Syncope 01/21/2015  . Diarrhea 01/21/2015  . Hypokalemia 01/21/2015  . Loss of consciousness ([place])   . Eczema 10/21/2014  . Essential hypertension 10/21/2014  .  Vasovagal syncope 10/21/2014  . Bilateral conjunctivitis 08/10/2014  . Other chronic pancreatitis ([place]) 08/10/2014  . COLD (chronic obstructive lung disease) ([place]) 08/10/2014  . Chronic ethmoidal sinusitis 08/10/2014  . Bronchitis 10/17/2010  . PLANTAR FASCIITIS, LEFT 10/17/2010  . OTHER NEUTROPENIA 04/21/2010  . CALLUS, TOE 03/15/2010  . LEUKOPENIA, MILD 02/20/2010  . LIVER FUNCTION TESTS, ABNORMAL, HX OF 02/16/2010  . DENTAL CARIES 02/15/2010  . ONYCHOMYCOSIS 07/05/2009  . ALLERGIC URTICARIA 07/05/2009  . PAIN IN LIMB 07/05/2009  . ERECTILE DYSFUNCTION 05/25/2008  . INFLUENZA 05/25/2008  . VIRAL INFECTION 10/01/2007  . HSV 05/02/2007  . CONDYLOMA ACUMINATUM 05/02/2007  . HYPERTENSION 05/02/2007  . ALLERGIC RHINITIS 05/02/2007  . Chronic asthma, mild persistent, uncomplicated 29/52/8413  . Gastroesophageal reflux disease without esophagitis 05/02/2007  . PANCREATITIS 05/02/2007   Past Medical History:  Diagnosis Date  . Arthritis   . Boil of buttock   . Chronic bronchitis ([place])   . COPD (chronic obstructive pulmonary disease) (HCC)    Chronic Bronchitis.Advair inhaler   . Dyspnea   . GERD (gastroesophageal reflux disease)    takes Protonix daily  . Gout    takes Allopurinol daily  . Hemorrhoids   . Hypertension    takes HCTZ daily  . Pancreatitis    10 years ago  . Pneumonia 2016  . Tubular adenoma of colon     Family History  Adopted: Yes  Problem Relation Age of Onset  . Cancer Sister 48       unknown origin     Past Surgical History:  Procedure Laterality Date  . AMPUTATION Left 07/14/2014   Procedure: 2nd Toe Amputation;  Surgeon: [name], MD;  Location: [facility] [facility];  Service: Orthopedics;  Laterality: Left;  . AMPUTATION Left 11/16/2015   Procedure: Left Great Toe Amputation at Metatarsophalangeal Joint;  Surgeon: [name], MD;  Location: [facility];  Service: Orthopedics;  Laterality: Left;  . AMPUTATION Right 06/13/2016   Procedure: RIGHT 2nd Toe  Amputation;  Surgeon: [name], MD;  Location: [facility];  Service: Orthopedics;  Laterality: Right;  . AMPUTATION Left 10/19/2016   Procedure: Amputation Left 3rd Toe;  Surgeon: [name], MD;  Location: [facility];  Service: Orthopedics;  Laterality: Left;  . TOE AMPUTATION Left 11/16/2015   left great toe   . TOE AMPUTATION Right 06/13/2016  . TONSILLECTOMY     Social History   Occupational History  . Not on file  Tobacco Use  . Smoking status: Never Smoker  . Smokeless tobacco: Never Used  Vaping Use  . Vaping Use: Never used  Substance and Sexual Activity  . Alcohol use: Yes    Alcohol/week: 0.0 standard drinks  Comment: rarely  . Drug use: No  . Sexual activity: Not Currently

## 2020-07-18 ENCOUNTER — Other Ambulatory Visit: Payer: Self-pay | Admitting: Nurse Practitioner

## 2020-07-18 DIAGNOSIS — M1A09X0 Idiopathic chronic gout, multiple sites, without tophus (tophi): Secondary | ICD-10-CM

## 2020-07-18 NOTE — Telephone Encounter (Signed)
Requested medication (s) are due for refill today: no  Requested medication (s) are on the active medication list: yes  Last refill:  05/27/2020  Future visit scheduled: no  Notes to clinic: medication was filled by a different provider Review for refill   Requested Prescriptions  Pending Prescriptions Disp Refills   allopurinol (ZYLOPRIM) 300 MG tablet [Pharmacy Med Name: Allopurinol 300 MG Oral Tablet] 30 tablet 0    Sig: Take 1 tablet by mouth once daily      Endocrinology:  Gout Agents Failed - 07/18/2020 10:11 AM      Failed - Uric Acid in normal range and within 360 days    Uric Acid  Date Value Ref Range Status  02/19/2018 5.4 3.7 - 8.6 mg/dL Final    Comment:               Therapeutic target for gout patients: <6.0          Failed - Cr in normal range and within 360 days    Creatinine  Date Value Ref Range Status  03/10/2018 0.92 0.61 - 1.24 mg/dL Final   Creat  Date Value Ref Range Status  09/20/2016 1.30 (H) 0.70 - 1.25 mg/dL Final    Comment:      For patients > or = 65 years of age: The upper reference limit for Creatinine is approximately 13% higher for people identified as African-American.      Creatinine, Ser  Date Value Ref Range Status  06/26/2019 0.92 0.76 - 1.27 mg/dL Final          Failed - Valid encounter within last 12 months    Recent Outpatient Visits           1 year ago Essential hypertension   [place], Jarome Matin, RPH-CPP   1 year ago Essential hypertension   [place], [name], NP   1 year ago Neuropathy   [place], [name], NP   1 year ago Essential hypertension   [place], RPH-CPP   1 year ago Essential hypertension   [place], Zelda W, NP       Future Appointments             In 1 week [name], MD [facility]

## 2020-07-19 ENCOUNTER — Telehealth: Payer: Self-pay | Admitting: Orthopedic Surgery

## 2020-07-19 NOTE — Telephone Encounter (Signed)
Patient called requesting a refill of allopurinol. Please send to pharmacy on file. Patient phone number is [phone].

## 2020-07-20 NOTE — Telephone Encounter (Signed)
Can you please call pt and make appt? Needs lab work before refill.

## 2020-07-21 ENCOUNTER — Other Ambulatory Visit: Payer: Self-pay

## 2020-07-21 ENCOUNTER — Encounter (HOSPITAL_COMMUNITY): Payer: Self-pay | Admitting: Emergency Medicine

## 2020-07-21 ENCOUNTER — Ambulatory Visit (HOSPITAL_COMMUNITY)
Admission: EM | Admit: 2020-07-21 | Discharge: 2020-07-21 | Disposition: A | Discharge status: Home / Self Care | Payer: BC Managed Care – PPO | Attending: Family Medicine | Admitting: Family Medicine

## 2020-07-21 DIAGNOSIS — M1A09X0 Idiopathic chronic gout, multiple sites, without tophus (tophi): Secondary | ICD-10-CM

## 2020-07-21 DIAGNOSIS — M1A9XX0 Chronic gout, unspecified, without tophus (tophi): Secondary | ICD-10-CM

## 2020-07-21 DIAGNOSIS — J441 Chronic obstructive pulmonary disease with (acute) exacerbation: Secondary | ICD-10-CM

## 2020-07-21 MED ORDER — ALLOPURINOL 300 MG PO TABS: 300.0000 mg | ORAL_TABLET | Freq: Every day | ORAL | 0 refills | Status: DC

## 2020-07-21 MED ORDER — PREDNISONE 10 MG PO TABS: ORAL_TABLET | ORAL | 0 refills | Status: DC

## 2020-07-21 MED ORDER — AZITHROMYCIN 250 MG PO TABS: ORAL_TABLET | ORAL | 0 refills | Status: DC

## 2020-07-21 NOTE — ED Triage Notes (Signed)
Symptoms started last week.  Patient is clearing throat.  Complains of sore throat, sob, wheezing, chest congestion, and minimal productive cough

## 2020-07-21 NOTE — ED Provider Notes (Signed)
[facility]    CSN: [id number] Arrival date & time: 07/21/20  1201      History   Chief Complaint Chief Complaint  Patient presents with  . Cough    HPI Stephen Whitaker is a 65 y.o. male.   Presenting today with 1.5 weeks of worsening hacking cough, chest tightness, wheezing, fatigue, SOB. Denies fever, chills, CP, abdominal pain, N/V/D. Has been taking his inhalers faithfully but states they are no longer helping. Hx of COPD and asthma. No known sick contacts. Was tested for COVID at onset of sxs which was negative.      Past Medical History:  Diagnosis Date  . Arthritis   . Boil of buttock   . Chronic bronchitis ([place])   . COPD (chronic obstructive pulmonary disease) (HCC)    Chronic Bronchitis.Advair inhaler   . Dyspnea   . GERD (gastroesophageal reflux disease)    takes Protonix daily  . Gout    takes Allopurinol daily  . Hemorrhoids   . Hypertension    takes HCTZ daily  . Pancreatitis    10 years ago  . Pneumonia 2016  . Tubular adenoma of colon     Patient Active Problem List   Diagnosis Date Noted  . Thrombocytopenia ([place]) 06/26/2019  . Acquired absence of other right toe(s) ([place]) 02/19/2018  . Boil of buttock 10/24/2016  . Amputated toe of left foot ([place]) 10/19/2016  . Amputated toe, right ([place]) 06/13/2016  . Chronic bronchitis ([place]) 11/24/2015  . Other pancytopenia ([place]) 01/24/2015  . Chronic pancreatitis ([place]) 01/24/2015  . Blood poisoning   . Neutropenia ([place])   . Iron deficiency anemia due to chronic blood loss   . Leukopenia   . Sepsis ([place]) 01/21/2015  . Leucopenia 01/21/2015  . Anemia 01/21/2015  . Syncope 01/21/2015  . Diarrhea 01/21/2015  . Hypokalemia 01/21/2015  . Loss of consciousness ([place])   . Eczema 10/21/2014  . Essential hypertension 10/21/2014  . Vasovagal syncope 10/21/2014  . Bilateral conjunctivitis 08/10/2014  . Other chronic pancreatitis ([place]) 08/10/2014  . COLD (chronic obstructive lung disease) ([place])  08/10/2014  . Chronic ethmoidal sinusitis 08/10/2014  . Bronchitis 10/17/2010  . PLANTAR FASCIITIS, LEFT 10/17/2010  . OTHER NEUTROPENIA 04/21/2010  . CALLUS, TOE 03/15/2010  . LEUKOPENIA, MILD 02/20/2010  . LIVER FUNCTION TESTS, ABNORMAL, HX OF 02/16/2010  . DENTAL CARIES 02/15/2010  . ONYCHOMYCOSIS 07/05/2009  . ALLERGIC URTICARIA 07/05/2009  . PAIN IN LIMB 07/05/2009  . ERECTILE DYSFUNCTION 05/25/2008  . INFLUENZA 05/25/2008  . VIRAL INFECTION 10/01/2007  . HSV 05/02/2007  . CONDYLOMA ACUMINATUM 05/02/2007  . HYPERTENSION 05/02/2007  . ALLERGIC RHINITIS 05/02/2007  . Chronic asthma, mild persistent, uncomplicated 07/37/1062  . Gastroesophageal reflux disease without esophagitis 05/02/2007  . PANCREATITIS 05/02/2007    Past Surgical History:  Procedure Laterality Date  . AMPUTATION Left 07/14/2014   Procedure: 2nd Toe Amputation;  Surgeon: [name], MD;  Location: [facility];  Service: Orthopedics;  Laterality: Left;  . AMPUTATION Left 11/16/2015   Procedure: Left Great Toe Amputation at Metatarsophalangeal Joint;  Surgeon: [name], MD;  Location: [facility];  Service: Orthopedics;  Laterality: Left;  . AMPUTATION Right 06/13/2016   Procedure: RIGHT 2nd Toe Amputation;  Surgeon: [name], MD;  Location: [facility];  Service: Orthopedics;  Laterality: Right;  . AMPUTATION Left 10/19/2016   Procedure: Amputation Left 3rd Toe;  Surgeon: [name], MD;  Location: [facility];  Service: Orthopedics;  Laterality: Left;  .  TOE AMPUTATION Left 11/16/2015   left great toe   . TOE AMPUTATION Right 06/13/2016  . TONSILLECTOMY         Home Medications    Prior to Admission medications   Medication Sig Start Date End Date Taking? Authorizing Provider  Bioflavonoid Products (ESTER-C) TABS Take 1 tablet by mouth daily.   Yes [provider]  Blood Pressure Monitoring DEVI 1 Units by Does not apply route daily. Measure blood pressure once a day 07/08/19  Yes Melynda Ripple,  MD  montelukast (SINGULAIR) 10 MG tablet TAKE 1 TABLET AT BEDTIME 06/27/20  Yes Tanda Rockers, MD  pantoprazole (PROTONIX) 40 MG tablet TAKE 1 TABLET BY MOUTH 30 TO 60 MINUTES BEFORE YOUR FIRST AND LAST MEALS OF THE DAY 05/04/20  Yes Tanda Rockers, MD  SPIRULINA PO Take 1 capsule by mouth 3 (three) times daily.   Yes [provider]  allopurinol (ZYLOPRIM) 300 MG tablet Take 1 tablet (300 mg total) by mouth daily. 07/21/20   Volney American, PA-C  amLODipine (NORVASC) 10 MG tablet Take 1 tablet (10 mg total) by mouth daily. 11/10/19 05/08/20  Scot Jun, FNP  azithromycin (ZITHROMAX) 250 MG tablet Take 2 tabs day one, then 1 tab daily until complete 07/21/20   Volney American, PA-C  budesonide-formoterol [facility]) 160-4.5 MCG/ACT inhaler Take 2 puffs first thing in am and then another 2 puffs about 12 hours later. 04/08/20   Tanda Rockers, MD  Colchicine 0.6 MG CAPS Take 1 capsule by mouth daily for 1 dose. 07/08/19 11/10/19  Suzan Slick, NP  fluticasone (FLONASE) 50 MCG/ACT nasal spray Place 1 spray into both nostrils 2 (two) times daily. 04/08/20   Tanda Rockers, MD  ibuprofen (ADVIL) 800 MG tablet Take 1 tablet (800 mg total) by mouth every 8 (eight) hours as needed. 07/02/19   Persons, Bevely Palmer, PA  predniSONE (DELTASONE) 10 MG tablet Take 6 tabs day one, 5 tabs day two, 4 tabs day three, etc 07/21/20   Volney American, PA-C    Family History Family History  Adopted: Yes  Problem Relation Age of Onset  . Cancer Sister 62       unknown origin     Social History Social History   Tobacco Use  . Smoking status: Never Smoker  . Smokeless tobacco: Never Used  Vaping Use  . Vaping Use: Never used  Substance Use Topics  . Alcohol use: Yes    Alcohol/week: 0.0 standard drinks    Comment: rarely  . Drug use: No     Allergies   Ciprofloxacin, Shellfish allergy, and Sulfa antibiotics   Review of Systems Review of Systems PER  HPI   Physical Exam Triage Vital Signs ED Triage Vitals  Enc Vitals Group     BP 07/21/20 1334 (!) 163/106     Pulse Rate 07/21/20 1334 94     Resp 07/21/20 1334 (!) 22     Temp 07/21/20 1334 98.6 F (37 C)     Temp Source 07/21/20 1334 Oral     SpO2 07/21/20 1334 100 %     Weight --      Height --      Head Circumference --      Peak Flow --      Pain Score 07/21/20 1330 0     Pain Loc --      Pain Edu? --      Excl. in [place]? --  No data found.  Updated Vital Signs BP (!) 155/96 (BP Location: Right Arm) Comment (BP Location): repositioned  Pulse 94   Temp 98.6 F (37 C) (Oral)   Resp (!) 22   SpO2 100%   Visual Acuity Right Eye Distance:   Left Eye Distance:   Bilateral Distance:    Right Eye Near:   Left Eye Near:    Bilateral Near:     Physical Exam Vitals and nursing note reviewed.  Constitutional:      Appearance: Normal appearance.  HENT:     Head: Atraumatic.     Right Ear: Tympanic membrane normal.     Left Ear: Tympanic membrane normal.     Nose: Congestion present.     Mouth/Throat:     Mouth: Mucous membranes are moist.     Pharynx: Oropharynx is clear. Posterior oropharyngeal erythema present.  Eyes:     Extraocular Movements: Extraocular movements intact.     Conjunctiva/sclera: Conjunctivae normal.  Cardiovascular:     Rate and Rhythm: Normal rate and regular rhythm.  Pulmonary:     Effort: Pulmonary effort is normal.     Breath sounds: Wheezing (scattered wheezes throughout) present. No rales.  Abdominal:     General: Bowel sounds are normal. There is no distension.     Palpations: Abdomen is soft.     Tenderness: There is no abdominal tenderness. There is no guarding.  Musculoskeletal:        General: Normal range of motion.     Cervical back: Normal range of motion and neck supple.  Skin:    General: Skin is warm and dry.  Neurological:     General: No focal deficit present.     Mental Status: He is oriented to person, place,  and time.  Psychiatric:        Mood and Affect: Mood normal.        Thought Content: Thought content normal.        Judgment: Judgment normal.      UC Treatments / Results  Labs (all labs ordered are listed, but only abnormal results are displayed) Labs Reviewed - No data to display  EKG   Radiology No results found.  Procedures Procedures (including critical care time)  Medications Ordered in UC Medications - No data to display  Initial Impression / Assessment and Plan / UC Course  I have reviewed the triage vital signs and the nursing notes.  Pertinent labs & imaging results that were available during my care of the patient were reviewed by me and considered in my medical decision making (see chart for details).     O2 saturation 100%, afebrile and speaking comfortably in full sentences in no distress. Will tx with prednisone taper, zpak given duration and worsening course with his risk factors for pneumonia though no evidence of pneumonia today. Continue inhalers, supportive home care. He also requests allopurinol refill for gout management. 30 days given but he's due for labs and will need to f/u with PCP for further refills.   Final Clinical Impressions(s) / UC Diagnoses   Final diagnoses:  COPD exacerbation ([place])  Chronic gout without tophus, unspecified cause, unspecified site   Discharge Instructions   None    ED Prescriptions    Medication Sig Dispense Auth. Provider   allopurinol (ZYLOPRIM) 300 MG tablet Take 1 tablet (300 mg total) by mouth daily. 30 tablet Volney American, [place]   azithromycin (ZITHROMAX) 250 MG tablet Take 2 tabs day one,  then 1 tab daily until complete 6 tablet Volney American, PA-C   predniSONE (DELTASONE) 10 MG tablet Take 6 tabs day one, 5 tabs day two, 4 tabs day three, etc 21 tablet Volney American, [place]     PDMP not reviewed this encounter.   Volney American, [place] 07/21/20 1531

## 2020-07-22 ENCOUNTER — Ambulatory Visit: Payer: BC Managed Care – PPO | Admitting: Physician Assistant

## 2020-07-28 ENCOUNTER — Encounter: Payer: Self-pay | Admitting: Orthopedic Surgery

## 2020-07-28 ENCOUNTER — Ambulatory Visit (INDEPENDENT_AMBULATORY_CARE_PROVIDER_SITE_OTHER): Payer: BC Managed Care – PPO | Admitting: Physician Assistant

## 2020-07-28 DIAGNOSIS — M1A09X0 Idiopathic chronic gout, multiple sites, without tophus (tophi): Secondary | ICD-10-CM | POA: Diagnosis not present

## 2020-07-28 DIAGNOSIS — L97521 Non-pressure chronic ulcer of other part of left foot limited to breakdown of skin: Secondary | ICD-10-CM

## 2020-07-28 NOTE — Addendum Note (Signed)
Addended by: Pamella Pert on: 07/28/2020 03:26 PM   Modules accepted: Orders

## 2020-07-28 NOTE — Progress Notes (Signed)
Office Visit Note   Patient: Stephen Whitaker           Date of Birth: [birth year]           MRN: [id number] Visit Date: 07/28/2020              Requested by: [name], NP [address] [place],  [place] [address] PCP: [name], NP  Chief Complaint  Patient presents with  . Left Foot - Follow-up      HPI: Patient presents in follow-up today for his left foot ulcer.  He thinks it is about the same.  He is wearing a compression sock.  Changes Band-Aid every day.  Assessment & Plan: Visit Diagnoses: No diagnosis found.  Plan: Continue current cleansing and application of Band-Aid with Iodosorb.  We will draw a uric acid today  Follow-Up Instructions: No follow-ups on file.   Ortho Exam  Patient is alert, oriented, no adenopathy, well-dressed, normal affect, normal respiratory effort. Focused examination of his left foot demonstrates no skin welling no cellulitis.  He does have a large ulcer on the plantar surface of the metatarsal head.  This has a mild foul odor and some drainage.  Does not probe deeply.  Has thickened callus which was debrided after obtaining verbal consent.  No signs of acute infection  Imaging: No results found. No images are attached to the encounter.  Labs: Lab Results  Component Value Date   HGBA1C 5.6 08/10/2014   ESRSEDRATE 27 (H) 07/05/2015   ESRSEDRATE 80 (H) 01/21/2015   CRP 1.8 (H) 07/05/2015   LABURIC 5.4 02/19/2018   LABURIC 6.0 08/10/2014   REPTSTATUS 01/22/2015 FINAL 01/21/2015   CULT NO GROWTH Performed at [facility]  01/21/2015     Lab Results  Component Value Date   ALBUMIN 3.9 06/26/2019   ALBUMIN 3.9 10/22/2018   ALBUMIN 4.1 06/23/2018   LABURIC 5.4 02/19/2018   LABURIC 6.0 08/10/2014    Lab Results  Component Value Date   MG 1.8 01/21/2015   Lab Results  Component Value Date   VD25OH 32.4 01/23/2017    No results found for: PREALBUMIN CBC EXTENDED Latest Ref Rng & Units 06/26/2019  10/22/2018 05/26/2018  WBC 3.4 - 10.8 x10E3/uL 2.8(L) 2.5(LL) 4.1  RBC 4.14 - 5.80 x10E6/uL 4.14 4.30 4.29  HGB 13.0 - 17.7 g/dL 11.9(L) 11.7(L) 12.1(L)  HCT 37.5 - 51.0 % 36.5(L) 36.7(L) 37.6(L)  PLT 150 - 450 x10E3/uL 247 219 206.0  NEUTROABS 1.4 - 7.0 x10E3/uL 1.3(L) - 2.6  LYMPHSABS 0.7 - 3.1 x10E3/uL 0.9 - 0.9     There is no height or weight on file to calculate BMI.  Orders:  No orders of the defined types were placed in this encounter.  No orders of the defined types were placed in this encounter.    Procedures: No procedures performed  Clinical Data: No additional findings.  ROS:  All other systems negative, except as noted in the HPI. Review of Systems  Objective: Vital Signs: There were no vitals taken for this visit.  Specialty Comments:  No specialty comments available.  PMFS History: Patient Active Problem List   Diagnosis Date Noted  . Thrombocytopenia ([place]) 06/26/2019  . Acquired absence of other right toe(s) ([place]) 02/19/2018  . Boil of buttock 10/24/2016  . Amputated toe of left foot ([place]) 10/19/2016  . Amputated toe, right ([place]) 06/13/2016  . Chronic bronchitis ([place]) 11/24/2015  . Other pancytopenia ([place]) 01/24/2015  . Chronic  pancreatitis ([place]) 01/24/2015  . Blood poisoning   . Neutropenia ([place])   . Iron deficiency anemia due to chronic blood loss   . Leukopenia   . Sepsis ([place]) 01/21/2015  . Leucopenia 01/21/2015  . Anemia 01/21/2015  . Syncope 01/21/2015  . Diarrhea 01/21/2015  . Hypokalemia 01/21/2015  . Loss of consciousness ([place])   . Eczema 10/21/2014  . Essential hypertension 10/21/2014  . Vasovagal syncope 10/21/2014  . Bilateral conjunctivitis 08/10/2014  . Other chronic pancreatitis ([place]) 08/10/2014  . COLD (chronic obstructive lung disease) ([place]) 08/10/2014  . Chronic ethmoidal sinusitis 08/10/2014  . Bronchitis 10/17/2010  . PLANTAR FASCIITIS, LEFT 10/17/2010  . OTHER NEUTROPENIA 04/21/2010  . CALLUS, TOE 03/15/2010  .  LEUKOPENIA, MILD 02/20/2010  . LIVER FUNCTION TESTS, ABNORMAL, HX OF 02/16/2010  . DENTAL CARIES 02/15/2010  . ONYCHOMYCOSIS 07/05/2009  . ALLERGIC URTICARIA 07/05/2009  . PAIN IN LIMB 07/05/2009  . ERECTILE DYSFUNCTION 05/25/2008  . INFLUENZA 05/25/2008  . VIRAL INFECTION 10/01/2007  . HSV 05/02/2007  . CONDYLOMA ACUMINATUM 05/02/2007  . HYPERTENSION 05/02/2007  . ALLERGIC RHINITIS 05/02/2007  . Chronic asthma, mild persistent, uncomplicated 37/29/0211  . Gastroesophageal reflux disease without esophagitis 05/02/2007  . PANCREATITIS 05/02/2007   Past Medical History:  Diagnosis Date  . Arthritis   . Boil of buttock   . Chronic bronchitis ([place])   . COPD (chronic obstructive pulmonary disease) (HCC)    Chronic Bronchitis.Advair inhaler   . Dyspnea   . GERD (gastroesophageal reflux disease)    takes Protonix daily  . Gout    takes Allopurinol daily  . Hemorrhoids   . Hypertension    takes HCTZ daily  . Pancreatitis    10 years ago  . Pneumonia 2016  . Tubular adenoma of colon     Family History  Adopted: Yes  Problem Relation Age of Onset  . Cancer Sister 24       unknown origin     Past Surgical History:  Procedure Laterality Date  . AMPUTATION Left 07/14/2014   Procedure: 2nd Toe Amputation;  Surgeon: [name], MD;  Location: [facility];  Service: Orthopedics;  Laterality: Left;  . AMPUTATION Left 11/16/2015   Procedure: Left Great Toe Amputation at Metatarsophalangeal Joint;  Surgeon: [name], MD;  Location: [facility];  Service: Orthopedics;  Laterality: Left;  . AMPUTATION Right 06/13/2016   Procedure: RIGHT 2nd Toe Amputation;  Surgeon: [name], MD;  Location: [facility];  Service: Orthopedics;  Laterality: Right;  . AMPUTATION Left 10/19/2016   Procedure: Amputation Left 3rd Toe;  Surgeon: [name], MD;  Location: [facility];  Service: Orthopedics;  Laterality: Left;  . TOE AMPUTATION Left 11/16/2015   left great toe   . TOE AMPUTATION Right 06/13/2016  .  TONSILLECTOMY     Social History   Occupational History  . Not on file  Tobacco Use  . Smoking status: Never Smoker  . Smokeless tobacco: Never Used  Vaping Use  . Vaping Use: Never used  Substance and Sexual Activity  . Alcohol use: Yes    Alcohol/week: 0.0 standard drinks    Comment: rarely  . Drug use: No  . Sexual activity: Not Currently

## 2020-07-29 ENCOUNTER — Other Ambulatory Visit: Payer: Self-pay | Admitting: Physician Assistant

## 2020-07-29 DIAGNOSIS — M1A09X0 Idiopathic chronic gout, multiple sites, without tophus (tophi): Secondary | ICD-10-CM

## 2020-07-29 LAB — URIC ACID: Uric Acid, Serum: 5.9 mg/dL (ref 4.0–8.0)

## 2020-07-29 MED ORDER — ALLOPURINOL 300 MG PO TABS: 300.0000 mg | ORAL_TABLET | Freq: Every day | ORAL | 0 refills | Status: DC

## 2020-08-02 ENCOUNTER — Ambulatory Visit: Payer: BC Managed Care – PPO | Admitting: Allergy and Immunology

## 2020-08-25 ENCOUNTER — Other Ambulatory Visit: Payer: Self-pay | Admitting: Family Medicine

## 2020-08-25 ENCOUNTER — Ambulatory Visit: Payer: BC Managed Care – PPO | Admitting: Orthopedic Surgery

## 2020-08-25 ENCOUNTER — Telehealth: Payer: Self-pay | Admitting: Internal Medicine

## 2020-08-25 DIAGNOSIS — I10 Essential (primary) hypertension: Secondary | ICD-10-CM

## 2020-08-25 MED ORDER — ALBUTEROL SULFATE HFA 108 (90 BASE) MCG/ACT IN AERS: 2.0000 | INHALATION_SPRAY | Freq: Four times a day (QID) | RESPIRATORY_TRACT | 5 refills | Status: DC | PRN

## 2020-08-25 NOTE — Telephone Encounter (Signed)
Called and spoke with pt letting him know that we do not carry any samples of rescue inhalers but he could check with PCP to see if they had any samples of it.pt verbalized understanding. Pt needed Rx to be sent to pharmacy so this has been sent for pt. Nothing further needed.

## 2020-08-26 ENCOUNTER — Other Ambulatory Visit: Payer: Self-pay | Admitting: Family Medicine

## 2020-08-26 DIAGNOSIS — I10 Essential (primary) hypertension: Secondary | ICD-10-CM

## 2020-08-26 NOTE — Telephone Encounter (Signed)
  Notes to clinic: Medication was last filled by [facility] B[facility] Last seen by office 12/03/2019 Review for refill  Requested Prescriptions  Pending Prescriptions Disp Refills   amLODipine (NORVASC) 10 MG tablet [Pharmacy Med Name: amLODIPine Besylate 10 MG Oral Tablet] 90 tablet 0    Sig: Take 1 tablet by mouth once daily      There is no refill protocol information for this order

## 2020-09-25 ENCOUNTER — Other Ambulatory Visit: Payer: Self-pay | Admitting: Family Medicine

## 2020-09-25 DIAGNOSIS — I10 Essential (primary) hypertension: Secondary | ICD-10-CM

## 2020-09-28 ENCOUNTER — Other Ambulatory Visit: Payer: Self-pay | Admitting: Internal Medicine

## 2020-09-28 MED ORDER — ALBUTEROL SULFATE HFA 108 (90 BASE) MCG/ACT IN AERS: 2.0000 | INHALATION_SPRAY | Freq: Four times a day (QID) | RESPIRATORY_TRACT | 6 refills | Status: DC | PRN

## 2020-09-30 ENCOUNTER — Other Ambulatory Visit: Payer: Self-pay

## 2020-09-30 ENCOUNTER — Ambulatory Visit (HOSPITAL_COMMUNITY)
Admission: EM | Admit: 2020-09-30 | Discharge: 2020-09-30 | Disposition: A | Discharge status: Home / Self Care | Payer: BC Managed Care – PPO | Attending: Emergency Medicine | Admitting: Emergency Medicine

## 2020-09-30 ENCOUNTER — Encounter (HOSPITAL_COMMUNITY): Payer: Self-pay | Admitting: Emergency Medicine

## 2020-09-30 ENCOUNTER — Other Ambulatory Visit: Payer: Self-pay | Admitting: Nurse Practitioner

## 2020-09-30 ENCOUNTER — Ambulatory Visit (INDEPENDENT_AMBULATORY_CARE_PROVIDER_SITE_OTHER): Payer: BC Managed Care – PPO

## 2020-09-30 DIAGNOSIS — J440 Chronic obstructive pulmonary disease with acute lower respiratory infection: Secondary | ICD-10-CM

## 2020-09-30 DIAGNOSIS — L97521 Non-pressure chronic ulcer of other part of left foot limited to breakdown of skin: Secondary | ICD-10-CM | POA: Diagnosis not present

## 2020-09-30 DIAGNOSIS — M2550 Pain in unspecified joint: Secondary | ICD-10-CM

## 2020-09-30 DIAGNOSIS — I10 Essential (primary) hypertension: Secondary | ICD-10-CM | POA: Diagnosis not present

## 2020-09-30 DIAGNOSIS — R600 Localized edema: Secondary | ICD-10-CM | POA: Diagnosis not present

## 2020-09-30 DIAGNOSIS — J90 Pleural effusion, not elsewhere classified: Secondary | ICD-10-CM | POA: Diagnosis not present

## 2020-09-30 DIAGNOSIS — R0602 Shortness of breath: Secondary | ICD-10-CM | POA: Diagnosis not present

## 2020-09-30 DIAGNOSIS — J9811 Atelectasis: Secondary | ICD-10-CM | POA: Diagnosis not present

## 2020-09-30 DIAGNOSIS — R062 Wheezing: Secondary | ICD-10-CM

## 2020-09-30 DIAGNOSIS — Z20822 Contact with and (suspected) exposure to covid-19: Secondary | ICD-10-CM | POA: Diagnosis not present

## 2020-09-30 DIAGNOSIS — J441 Chronic obstructive pulmonary disease with (acute) exacerbation: Secondary | ICD-10-CM | POA: Insufficient documentation

## 2020-09-30 MED ORDER — PREDNISONE 10 MG PO TABS: 40.0000 mg | ORAL_TABLET | Freq: Every day | ORAL | 0 refills | Status: AC

## 2020-09-30 MED ORDER — IBUPROFEN 800 MG PO TABS: 800.0000 mg | ORAL_TABLET | Freq: Three times a day (TID) | ORAL | 0 refills | Status: DC | PRN

## 2020-09-30 MED ORDER — DOXYCYCLINE HYCLATE 100 MG PO CAPS: 100.0000 mg | ORAL_CAPSULE | Freq: Two times a day (BID) | ORAL | 0 refills | Status: DC

## 2020-09-30 NOTE — ED Triage Notes (Signed)
Pt states that he has swelling in both legs and usually experiences swelling but states this is more excessive that started one week ago. Pt states that he also has SOB and wheezing that started two weeks ago. Pt would also like his abscess on left foot to be examined to make sure it is healing correctly.

## 2020-09-30 NOTE — Telephone Encounter (Signed)
Courtesy refill. Future visit in 1 week , last labs 06/26/2019

## 2020-09-30 NOTE — Discharge Instructions (Signed)
Take the antibiotic as directed.  Follow-up with your primary care provider next week.    Follow-up with your orthopedist next week.

## 2020-09-30 NOTE — ED Provider Notes (Signed)
[facility]    CSN: [id number] Arrival date & time: 09/30/20  1648      History   Chief Complaint Chief Complaint  Patient presents with  . Abscess  . Leg Swelling    HPI Stephen Whitaker is a 65 y.o. male.  Patient presents with 1 week history of increased lower extremity edema. He also reports wheezing, and shortness of breath. He states his symptoms started when he received a COVID booster. Patient would also like to have an ulcer on the bottom of his left foot checked; This is a chronic ulcer and he is followed by orthopedics monthly. He states he missed his appointment with the orthopedist last month. He denies fever, chills, unusual cough, vomiting, diarrhea, or other symptoms.  Patient also requests a refill on his ibuprofen.  His medical history includes pneumonia, amputation of toes, hypertension, COPD, pancreatitis, syncope, anemia.  The history is provided by the patient and medical records.    Past Medical History:  Diagnosis Date  . Arthritis   . Boil of buttock   . Chronic bronchitis ([place])   . COPD (chronic obstructive pulmonary disease) (HCC)    Chronic Bronchitis.Advair inhaler   . Dyspnea   . GERD (gastroesophageal reflux disease)    takes Protonix daily  . Gout    takes Allopurinol daily  . Hemorrhoids   . Hypertension    takes HCTZ daily  . Pancreatitis    10 years ago  . Pneumonia 2016  . Tubular adenoma of colon     Patient Active Problem List   Diagnosis Date Noted  . Thrombocytopenia ([place]) 06/26/2019  . Acquired absence of other right toe(s) ([place]) 02/19/2018  . Boil of buttock 10/24/2016  . Amputated toe of left foot ([place]) 10/19/2016  . Amputated toe, right ([place]) 06/13/2016  . Chronic bronchitis ([place]) 11/24/2015  . Other pancytopenia ([place]) 01/24/2015  . Chronic pancreatitis ([place]) 01/24/2015  . Blood poisoning   . Neutropenia ([place])   . Iron deficiency anemia due to chronic blood loss   . Leukopenia   . Sepsis ([place])  01/21/2015  . Leucopenia 01/21/2015  . Anemia 01/21/2015  . Syncope 01/21/2015  . Diarrhea 01/21/2015  . Hypokalemia 01/21/2015  . Loss of consciousness ([place])   . Eczema 10/21/2014  . Essential hypertension 10/21/2014  . Vasovagal syncope 10/21/2014  . Bilateral conjunctivitis 08/10/2014  . Other chronic pancreatitis ([place]) 08/10/2014  . COLD (chronic obstructive lung disease) ([place]) 08/10/2014  . Chronic ethmoidal sinusitis 08/10/2014  . Bronchitis 10/17/2010  . PLANTAR FASCIITIS, LEFT 10/17/2010  . OTHER NEUTROPENIA 04/21/2010  . CALLUS, TOE 03/15/2010  . LEUKOPENIA, MILD 02/20/2010  . LIVER FUNCTION TESTS, ABNORMAL, HX OF 02/16/2010  . DENTAL CARIES 02/15/2010  . ONYCHOMYCOSIS 07/05/2009  . ALLERGIC URTICARIA 07/05/2009  . PAIN IN LIMB 07/05/2009  . ERECTILE DYSFUNCTION 05/25/2008  . INFLUENZA 05/25/2008  . VIRAL INFECTION 10/01/2007  . HSV 05/02/2007  . CONDYLOMA ACUMINATUM 05/02/2007  . HYPERTENSION 05/02/2007  . ALLERGIC RHINITIS 05/02/2007  . Chronic asthma, mild persistent, uncomplicated 56/31/4970  . Gastroesophageal reflux disease without esophagitis 05/02/2007  . PANCREATITIS 05/02/2007    Past Surgical History:  Procedure Laterality Date  . AMPUTATION Left 07/14/2014   Procedure: 2nd Toe Amputation;  Surgeon: [name], MD;  Location: [facility];  Service: Orthopedics;  Laterality: Left;  . AMPUTATION Left 11/16/2015   Procedure: Left Great Toe Amputation at Metatarsophalangeal Joint;  Surgeon: [name], MD;  Location: [facility];  Service: Orthopedics;  Laterality: Left;  . AMPUTATION Right 06/13/2016   Procedure: RIGHT 2nd Toe Amputation;  Surgeon: [name], MD;  Location: [facility];  Service: Orthopedics;  Laterality: Right;  . AMPUTATION Left 10/19/2016   Procedure: Amputation Left 3rd Toe;  Surgeon: [name], MD;  Location: [facility];  Service: Orthopedics;  Laterality: Left;  . TOE AMPUTATION Left 11/16/2015   left great toe   . TOE AMPUTATION Right  06/13/2016  . TONSILLECTOMY         Home Medications    Prior to Admission medications   Medication Sig Start Date End Date Taking? Authorizing Provider  doxycycline (VIBRAMYCIN) 100 MG capsule Take 1 capsule (100 mg total) by mouth 2 (two) times daily. 09/30/20  Yes Sharion Balloon, NP  predniSONE (DELTASONE) 10 MG tablet Take 4 tablets (40 mg total) by mouth daily for 5 days. 09/30/20 10/05/20 Yes Sharion Balloon, NP  albuterol (VENTOLIN HFA) 108 (90 Base) MCG/ACT inhaler Inhale 2 puffs into the lungs every 6 (six) hours as needed for wheezing or shortness of breath. 08/25/20   Tanda Rockers, MD  albuterol (VENTOLIN HFA) 108 (90 Base) MCG/ACT inhaler Inhale 2 puffs into the lungs every 6 (six) hours as needed for wheezing or shortness of breath. 09/28/20   Tanda Rockers, MD  allopurinol (ZYLOPRIM) 300 MG tablet Take 1 tablet (300 mg total) by mouth daily. 07/29/20   Persons, Bevely Palmer, PA  amLODipine (NORVASC) 10 MG tablet TAKE 1 TABLET BY MOUTH ONCE DAILY **MUST  KEEP  UPCOMING  OFFICE  VISIT  FOR  REFILLS** 09/25/20   Charlott Rakes, MD  Bioflavonoid Products (ESTER-C) TABS Take 1 tablet by mouth daily.    [provider]  Blood Pressure Monitoring DEVI 1 Units by Does not apply route daily. Measure blood pressure once a day 07/08/19   Melynda Ripple, MD  budesonide-formoterol [facility]) 160-4.5 MCG/ACT inhaler Take 2 puffs first thing in am and then another 2 puffs about 12 hours later. 04/08/20   Tanda Rockers, MD  Colchicine 0.6 MG CAPS Take 1 capsule by mouth daily for 1 dose. 07/08/19 11/10/19  Suzan Slick, NP  fluticasone (FLONASE) 50 MCG/ACT nasal spray Place 1 spray into both nostrils 2 (two) times daily. 04/08/20   Tanda Rockers, MD  ibuprofen (ADVIL) 800 MG tablet Take 1 tablet (800 mg total) by mouth every 8 (eight) hours as needed. 09/30/20   Sharion Balloon, NP  montelukast (SINGULAIR) 10 MG tablet TAKE 1 TABLET AT BEDTIME 06/27/20   Tanda Rockers, MD  pantoprazole  (PROTONIX) 40 MG tablet TAKE 1 TABLET BY MOUTH 30 TO 60 MINUTES BEFORE YOUR FIRST AND LAST MEALS OF THE DAY 05/04/20   Tanda Rockers, MD  SPIRULINA PO Take 1 capsule by mouth 3 (three) times daily.    [provider]    Family History Family History  Adopted: Yes  Problem Relation Age of Onset  . Cancer Sister 65       unknown origin     Social History Social History   Tobacco Use  . Smoking status: Never Smoker  . Smokeless tobacco: Never Used  Vaping Use  . Vaping Use: Never used  Substance Use Topics  . Alcohol use: Yes    Alcohol/week: 0.0 standard drinks    Comment: rarely  . Drug use: No     Allergies   Ciprofloxacin, Shellfish allergy, and Sulfa antibiotics   Review of Systems Review of Systems  Constitutional: Negative for chills and fever.  HENT: Negative for ear pain and sore throat.   Eyes: Negative for pain and visual disturbance.  Respiratory: Positive for cough, shortness of breath and wheezing.   Cardiovascular: Positive for leg swelling. Negative for chest pain and palpitations.  Gastrointestinal: Negative for abdominal pain and vomiting.  Genitourinary: Negative for dysuria and hematuria.  Musculoskeletal: Negative for arthralgias and back pain.  Skin: Positive for wound. Negative for color change and rash.  Neurological: Negative for seizures and syncope.  All other systems reviewed and are negative.    Physical Exam Triage Vital Signs ED Triage Vitals  Enc Vitals Group     BP      Pulse      Resp      Temp      Temp src      SpO2      Weight      Height      Head Circumference      Peak Flow      Pain Score      Pain Loc      Pain Edu?      Excl. in [place]?    No data found.  Updated Vital Signs BP (!) 149/104 (BP Location: Left Arm)   Pulse (!) 112   Temp 98.4 F (36.9 C) (Oral)   Resp 18   SpO2 97%   Visual Acuity Right Eye Distance:   Left Eye Distance:   Bilateral Distance:    Right Eye Near:   Left Eye  Near:    Bilateral Near:     Physical Exam Vitals and nursing note reviewed.  Constitutional:      Appearance: He is well-developed and well-nourished.  HENT:     Head: Normocephalic and atraumatic.     Mouth/Throat:     Mouth: Mucous membranes are moist.  Eyes:     Conjunctiva/sclera: Conjunctivae normal.  Cardiovascular:     Rate and Rhythm: Normal rate and regular rhythm.     Heart sounds: Normal heart sounds.  Pulmonary:     Effort: Pulmonary effort is normal. No respiratory distress.     Breath sounds: Wheezing present.     Comments: Bilateral expiratory wheezes Abdominal:     Palpations: Abdomen is soft.     Tenderness: There is no abdominal tenderness.  Musculoskeletal:     Cervical back: Neck supple.     Right lower leg: Edema present.     Left lower leg: Edema present.     Comments: Pitting edema of bilateral lower extremities, L>R.  Skin:    General: Skin is warm and dry.     Findings: Lesion present.     Comments: Ulcer on plantar surface of left foot at metatarsal head; scant malodorous drainage. No erythema.  Neurological:     General: No focal deficit present.     Mental Status: He is alert and oriented to person, place, and time.     Gait: Gait normal.  Psychiatric:        Mood and Affect: Mood and affect and mood normal.        Behavior: Behavior normal.      UC Treatments / Results  Labs (all labs ordered are listed, but only abnormal results are displayed) Labs Reviewed  SARS CORONAVIRUS 2 (TAT 6-24 HRS)    EKG   Radiology DG Chest 2 View  Result Date: 09/30/2020 CLINICAL DATA:  Shortness of breath, wheezing and chest congestion. EXAM: CHEST -  2 VIEW COMPARISON:  08/13/2015 FINDINGS: The heart is within normal limits in size. The mediastinal and hilar contours are within normal limits. There are small bilateral pleural effusions and bibasilar atelectasis. No definite infiltrates or pulmonary edema. IMPRESSION: Small bilateral pleural effusions  and bibasilar atelectasis. Electronically Signed   By: Marijo Sanes M.D.   On: 09/30/2020 18:06    Procedures Procedures (including critical care time)  Medications Ordered in UC Medications - No data to display  Initial Impression / Assessment and Plan / UC Course  I have reviewed the triage vital signs and the nursing notes.  Pertinent labs & imaging results that were available during my care of the patient were reviewed by me and considered in my medical decision making (see chart for details).   Chronic nonpressure ulcer on left foot.  Bilateral lower extremity edema.  Shortness of breath, COPD exacerbation.  Patient was treated with prednisone and Zithromax in December.  Treating today with prednisone and doxycycline.  Refill on ibuprofen provided.  Instructed patient to follow-up with his PCP next week.  Instructed him to follow-up with his orthopedist next week also for his chronic foot ulcer.  He agrees to plan of care.   Final Clinical Impressions(s) / UC Diagnoses   Final diagnoses:  Non-pressure chronic ulcer of other part of left foot limited to breakdown of skin (HCC)  Bilateral lower extremity edema  SOB (shortness of breath)  COPD exacerbation (HCC)     Discharge Instructions     Take the antibiotic as directed.  Follow-up with your primary care provider next week.    Follow-up with your orthopedist next week.        ED Prescriptions    Medication Sig Dispense Auth. Provider   doxycycline (VIBRAMYCIN) 100 MG capsule Take 1 capsule (100 mg total) by mouth 2 (two) times daily. 20 capsule Sharion Balloon, NP   predniSONE (DELTASONE) 10 MG tablet Take 4 tablets (40 mg total) by mouth daily for 5 days. 20 tablet Sharion Balloon, NP   ibuprofen (ADVIL) 800 MG tablet Take 1 tablet (800 mg total) by mouth every 8 (eight) hours as needed. 90 tablet Sharion Balloon, NP     PDMP not reviewed this encounter.   Sharion Balloon, NP 09/30/20 [phone]

## 2020-10-01 LAB — SARS CORONAVIRUS 2 (TAT 6-24 HRS): SARS Coronavirus 2: NEGATIVE

## 2020-10-06 ENCOUNTER — Other Ambulatory Visit (INDEPENDENT_AMBULATORY_CARE_PROVIDER_SITE_OTHER): Payer: BC Managed Care – PPO

## 2020-10-06 ENCOUNTER — Ambulatory Visit (INDEPENDENT_AMBULATORY_CARE_PROVIDER_SITE_OTHER): Payer: BC Managed Care – PPO | Admitting: Internal Medicine

## 2020-10-06 ENCOUNTER — Encounter: Payer: Self-pay | Admitting: Internal Medicine

## 2020-10-06 ENCOUNTER — Other Ambulatory Visit: Payer: Self-pay

## 2020-10-06 DIAGNOSIS — R0600 Dyspnea, unspecified: Secondary | ICD-10-CM

## 2020-10-06 DIAGNOSIS — M7989 Other specified soft tissue disorders: Secondary | ICD-10-CM

## 2020-10-06 DIAGNOSIS — J4530 Mild persistent asthma, uncomplicated: Secondary | ICD-10-CM | POA: Diagnosis not present

## 2020-10-06 DIAGNOSIS — R0609 Other forms of dyspnea: Secondary | ICD-10-CM

## 2020-10-06 LAB — CBC WITH DIFFERENTIAL/PLATELET
Basophils Absolute: 0 10*3/uL (ref 0.0–0.1)
Basophils Relative: 0.4 % (ref 0.0–3.0)
Eosinophils Absolute: 0 10*3/uL (ref 0.0–0.7)
Eosinophils Relative: 0.8 % (ref 0.0–5.0)
HCT: 37.3 % — ABNORMAL LOW (ref 39.0–52.0)
Hemoglobin: 12 g/dL — ABNORMAL LOW (ref 13.0–17.0)
Lymphocytes Relative: 9.1 % — ABNORMAL LOW (ref 12.0–46.0)
Lymphs Abs: 0.5 10*3/uL — ABNORMAL LOW (ref 0.7–4.0)
MCHC: 32.1 g/dL (ref 30.0–36.0)
MCV: 83.6 fl (ref 78.0–100.0)
Monocytes Absolute: 0.3 10*3/uL (ref 0.1–1.0)
Monocytes Relative: 4.9 % (ref 3.0–12.0)
Neutro Abs: 4.6 10*3/uL (ref 1.4–7.7)
Neutrophils Relative %: 84.8 % — ABNORMAL HIGH (ref 43.0–77.0)
Platelets: 338 10*3/uL (ref 150.0–400.0)
RBC: 4.46 Mil/uL (ref 4.22–5.81)
RDW: 14.6 % (ref 11.5–15.5)
WBC: 5.4 10*3/uL (ref 4.0–10.5)

## 2020-10-06 LAB — BASIC METABOLIC PANEL
BUN: 23 mg/dL (ref 6–23)
CO2: 30 mEq/L (ref 19–32)
Calcium: 8.3 mg/dL — ABNORMAL LOW (ref 8.4–10.5)
Chloride: 106 mEq/L (ref 96–112)
Creatinine, Ser: 1.27 mg/dL (ref 0.40–1.50)
GFR: 59.16 mL/min — ABNORMAL LOW (ref >60.00)
Glucose, Bld: 102 mg/dL — ABNORMAL HIGH (ref 70–99)
Potassium: 3.7 mEq/L (ref 3.5–5.1)
Sodium: 140 mEq/L (ref 135–145)

## 2020-10-06 LAB — BRAIN NATRIURETIC PEPTIDE: Pro B Natriuretic peptide (BNP): 1459 pg/mL — ABNORMAL HIGH (ref 0.0–100.0)

## 2020-10-06 MED ORDER — FUROSEMIDE 20 MG PO TABS: ORAL_TABLET | ORAL | 2 refills | Status: DC

## 2020-10-06 MED ORDER — ALBUTEROL SULFATE HFA 108 (90 BASE) MCG/ACT IN AERS
2.0000 | INHALATION_SPRAY | RESPIRATORY_TRACT | 1 refills | Status: DC | PRN
Start: 2020-10-06 — End: 2020-10-06

## 2020-10-06 MED ORDER — ALBUTEROL SULFATE HFA 108 (90 BASE) MCG/ACT IN AERS
2.0000 | INHALATION_SPRAY | RESPIRATORY_TRACT | 1 refills | Status: DC | PRN
Start: 2020-10-06 — End: 2021-07-05

## 2020-10-06 NOTE — Progress Notes (Addendum)
Stephen Whitaker, male    DOB: 12/06/1955     MRN: [id number]     Brief patient profile:  65 yobm never smoker dx as childhood asthma/ rhinitis with chronic symptoms preventing nl activities much better p age 65 then moved to college at [employer] age 34 more symptoms on some form of daily rx much better on advair and daily albterol  but still bothered by year round rhinitis and then when advair ran out breathing worse then changed to  symbicort and better on symbicort but still using saba bid and referred to pulmonary clinic 05/26/2018 by [name]    History of Present Illness  05/26/2018  Pulmonary / new pt eval / Jinan Biggins  Chief Complaint  Patient presents with  . Pulmonary Consult    Referred by [name], NP for eval of bronchitis. Pt states he has had issues with bronchitis all of his life. He states he is not currently bothered by a cough or any trouble with breathing.   Dyspnea:  MMRC1 = can walk nl pace, flat grade, can't hurry or go uphills or steps s sob   Cough: none at present though some sense of pnds  Sleep: on side flat bed/ 1-2 pillows SABA use: ventolin avg   twice daily on or off advair or symbicort  rec Plan A = Automatic = Continue Symbicort 160 Take 2 puffs first thing in am and then another 2 puffs about 12 hours later.  Work on inhaler technique:    Plan B = Backup Only use your albuterol (VENTOLIN)   Increase Protonix to Take 30- 60 min before your first and last meals of the day  GERD diet           06/23/2018  f/u ov/Roshana Shuffield re: asthma/ rhinitis with marked atopy / difficulty affording meds despite insurance / did not bring in meds or formulary  Chief Complaint  Patient presents with  . Follow-up    Breathing is doing better. He is using his rescue inhaler 1-2 x per wk on average.   Dyspnea:  Not limited by breathing from desired activities   Cough: none but some sense of pnds day > noct with globus sensation  Sleeping: flat one pillow SABA use: as  above - much less since started symb 160 vs advair prior  rec Plan A = Automatic = Symbicort 160 Take 2 puffs first thing in am and then another 2 puffs about 12 hours later.  Plan B = Backup Only use your albuterol as a rescue medication  If you cannot access the symbicort then return here in 2 weeks with your drug  formulary in hand (this the information)   to see me or one of my nurse practioners   If happy we can see you in 3 months  Late add:  Change proair respiclick to hfa (inadvertantly called in the dpi form which is likely to irrritate his throat)    02/06/2019  f/u ov/Garo Heidelberg re: asthma/ rhinitis  Chief Complaint  Patient presents with  . Follow-up    Breathing is doing well. He is using his rescue inhaler on average once per month. +   Dyspnea:   Fine as long as on symb Cough: none/ still nasal drainage/congestion does not recall whether singulair helped  Sleeping: flat / one pillow SABA use: none while on symbicort  - issue is formulary restriction rec Plan A = Automatic = dulera 200 Take 2 puffs first thing in am and  then another 2 puffs about 12 hours later.  Singulair (montelukast) 10 mg one before sleep for nasal symptoms   Plan B = Backup Only use your albuterol inhaler as a rescue medication  Plan C = Crisis - only use your albuterol nebulizer if you first try Plan B and it fails to help > ok to use the nebulizer up to every 4 hours but if start needing it regularly call for immediate appointment We will call you with referral to allergy > never went      06/12/2019  f/u ov/Kristelle Cavallaro re: asthma/ rhinitis easily confused with details of care Chief Complaint  Patient presents with  . Follow-up    Breathing is doing well and he states he rarely uses his albuterol. No new co's.   Dyspnea:  Not limited by breathing from desired activities   Cough: nasal drainge/ congestion Sleeping: fine flat  SABA use: rare 02: none  rec Plan A = Automatic = dulera 200 Take 2 puffs  first thing in am and then another 2 puffs about 12 hours later.  Singulair (montelukast) 10 mg one before sleep for nasal symptoms   Plan B = Backup Only use your albuterol inhaler as a rescue medication   04/08/2020  f/u ov/Jewell Haught re: asthma / rhinitis with elevated IgE  maint on singulair/ symbicort 160  Chief Complaint  Patient presents with  . Follow-up    SOB unchanged   Dyspnea:  Bothered by heat does fine in [name] Cough: no Sleeping: bed flat on side one pillow SABA use: once a day rarely 02: none  rec We will call to set up an appt to see Dr Bruna Potter group to evaluate your allergies >  Did not go  No change in medications     10/06/2020  f/u ov/Lynnleigh Soden re: asthma/ rhinitis s/p ER 10/01/19 doxy/pred / symb 160  2bid/ singulair> only a little better  Chief Complaint  Patient presents with  . Follow-up    Non productive cough, wheezing, swelling/inflammation from waist down since took booster shot last week of Jan 2022   Dyspnea: doe x 50 ft  Cough: dry cough subjective wheeze  Sleeping: bed is flat and rotates one pillow  SABA use: every 6 hours  02: none  Covid status:   vax x 3 New problem is swelling waist down sym bilaterally on high dose amlodipine x sev months  No obvious day to day or daytime variability or assoc excess/ purulent sputum or mucus plugs or hemoptysis or cp or chest tightness, subjective wheeze or overt sinus or hb symptoms.   Sleeping as above without nocturnal  or early am exacerbation  of respiratory  c/o's or need for noct saba. Also denies any obvious fluctuation of symptoms with weather or environmental changes or other aggravating or alleviating factors except as outlined above   No unusual exposure hx or h/o childhood pna  or knowledge of premature birth.  Current Allergies, Complete Past Medical History, Past Surgical History, Family History, and Social History were reviewed in [employer] record.  ROS  The following are not  active complaints unless bolded Hoarseness, sore throat, dysphagia, dental problems, itching, sneezing,  nasal congestion or discharge of excess mucus or purulent secretions, ear ache,   fever, chills, sweats, unintended wt loss or wt gain, classically pleuritic or exertional cp,  orthopnea pnd or arm/hand swelling  or leg swelling, presyncope, palpitations, abdominal pain, anorexia, nausea, vomiting, diarrhea  or change in bowel habits or  change in bladder habits, change in stools or change in urine, dysuria, hematuria,  rash, arthralgias, visual complaints, headache, numbness, weakness or ataxia or problems with walking or coordination,  change in mood or  memory.        Current Meds  Medication Sig  . albuterol (VENTOLIN HFA) 108 (90 Base) MCG/ACT inhaler Inhale 2 puffs into the lungs every 6 (six) hours as needed for wheezing or shortness of breath.  Marland Kitchen albuterol (VENTOLIN HFA) 108 (90 Base) MCG/ACT inhaler Inhale 2 puffs into the lungs every 6 (six) hours as needed for wheezing or shortness of breath.  . allopurinol (ZYLOPRIM) 300 MG tablet Take 1 tablet (300 mg total) by mouth daily.  Marland Kitchen amLODipine (NORVASC) 10 MG tablet TAKE 1 TABLET BY MOUTH ONCE DAILY **MUST  KEEP  UPCOMING  OFFICE  VISIT  FOR  REFILLS**  . Bioflavonoid Products (ESTER-C) TABS Take 1 tablet by mouth daily.  . Blood Pressure Monitoring DEVI 1 Units by Does not apply route daily. Measure blood pressure once a day  . budesonide-formoterol (SYMBICORT) 160-4.5 MCG/ACT inhaler Take 2 puffs first thing in am and then another 2 puffs about 12 hours later.  . doxycycline (VIBRAMYCIN) 100 MG capsule Take 1 capsule (100 mg total) by mouth 2 (two) times daily.  . fluticasone (FLONASE) 50 MCG/ACT nasal spray Place 1 spray into both nostrils 2 (two) times daily.  Marland Kitchen ibuprofen (ADVIL) 800 MG tablet TAKE 1 TABLET BY MOUTH EVERY 8 HOURS AS NEEDED  . montelukast (SINGULAIR) 10 MG tablet TAKE 1 TABLET AT BEDTIME  . pantoprazole (PROTONIX) 40 MG  tablet TAKE 1 TABLET BY MOUTH 30 TO 60 MINUTES BEFORE YOUR FIRST AND LAST MEALS OF THE DAY  . SPIRULINA PO Take 1 capsule by mouth 3 (three) times daily.                Objective:     10/06/2020       225   04/08/2020       194  06/12/2019      195  02/06/2019       198   06/23/18 200 lb 12.8 oz (91.1 kg)  06/23/18 209 lb (94.8 kg)  05/26/18 196 lb 9.6 oz (89.2 kg)     Vital signs reviewed  10/06/2020  - Note at rest 02 sats  98% on RA   General appearance:   amb bm nad / mild pseudowheeze better with plm   HEENT : pt wearing mask not removed for exam due to covid - 19 concerns.   NECK :  without JVD/Nodes/TM/ nl carotid upstrokes bilaterally   LUNGS: no acc muscle use,  Min barrel  contour chest wall with bilateral  slightly decreased bs s audible wheeze and  without cough on insp or exp maneuvers and min  Hyperresonant  to  percussion bilaterally     CV:  RRR  ? S3 gallop, no murmur or increase in P2, and 1=2+ pitting  edema both LE's  ABD:  soft and nontender with pos end  insp Hoover's  in the supine position. No bruits or organomegaly appreciated, bowel sounds nl  MS:   Nl gait/  ext warm without deformities, calf tenderness, cyanosis or clubbing No obvious joint restrictions   SKIN: warm and dry without lesions    NEURO:  alert, approp, nl sensorium with  no motor or cerebellar deficits apparent.          I personally reviewed images and agree with radiology  impression as follows:  09/30/20 CXR:   Bilateral effusions         Assessment

## 2020-10-06 NOTE — Assessment & Plan Note (Addendum)
New onset Jan 2022 assoc with bilateral effusions  >>>  Most likely developing at least diastolic dysfunction > stop NSAIDS and rx  lasix 40 mg daily until better then 10 mg daily and consider d/c amlopdine, rx cardizem or bisoprolol per PCP with f/u planned w/in a week            Each maintenance medication was reviewed in detail including emphasizing most importantly the difference between maintenance and prns and under what circumstances the prns are to be triggered using an action plan format where appropriate.  Total time for H and P, chart review, counseling, reviewing hfa device(s) and generating customized AVS unique to this office visit / same day charting  > 30 min

## 2020-10-06 NOTE — Patient Instructions (Addendum)
Plan A = Automatic = symbicort 160  Take 2 puffs first thing in am and then another 2 puffs about 12 hours later.   Singulair (montelukast) 10 mg one before sleep     Plan B = Backup Only use your albuterol inhaler as a rescue medication to be used if you can't catch your breath by resting or doing a relaxed purse lip breathing pattern.  - The less you use it, the better it will work when you need it. - Ok to use the inhaler up to 2 puffs  every 4 hours if you must but call for appointment if use goes up over your usual need - Don't leave home without it !!  (think of it like the spare tire for your car)   We will try to get back scheduled with Dr Neldon Mc  - ok to cancel me once you see Kozlow   See [name] NP for your swelling and in meantime start lasix 20 mg x 2 daily until better then one daily    Please schedule a follow up visit in 3 months but call sooner if needed  with all medications /inhalers/ solutions in hand so we can verify exactly what you are taking. This includes all medications from all doctors and over the counters

## 2020-10-06 NOTE — Assessment & Plan Note (Addendum)
Onset in childhood Allergy profile 05/26/2018 >  Eos 0.1/  IgE  905  RAST pan pos - Spirometry 05/26/2018  FEV1 2.5 (75%)  Ratio 77 min curvature p am symb 160 x2 puffs  - 05/26/2018   continue symbicort 160 / leave off singulair as says can't afford it on BCBS plan/ submitted [place] and me paperwork for symb - 02/06/2019   symbicort 160 (or dulera 200 depending on formulary)  added singulair  Trial - referred to allergy 02/06/2019  "could not connect" - 06/12/2019  After extensive coaching inhaler device,  effectiveness =    95% - 06/12/2019 add singulair for nasal symptoms and blow symbicort out thru the nose  - 10/06/2020 referred again to allergy   Poorly controlled asthma ? Etiology.  DDX of  difficult airways management almost all start with A and  include Adherence, Ace Inhibitors, Acid Reflux, Active Sinus Disease, Alpha 1 Antitripsin deficiency, Anxiety masquerading as Airways dz,  ABPA,  Allergy(esp in young), Aspiration (esp in elderly), Adverse effects of meds,  Active smoking or vaping, A bunch of PE's (a small clot burden can't cause this syndrome unless there is already severe underlying pulm or vascular dz with poor reserve) plus two Bs  = Bronchiectasis and Beta blocker use..and one C= CHF  Adherence is always the initial "prime suspect" and is a multilayered concern that requires a "trust but verify" approach in every patient - starting with knowing how to use medications, especially inhalers, correctly, keeping up with refills and understanding the fundamental difference between maintenance and prns vs those medications only taken for a very short course and then stopped and not refilled.  - return with all meds - - The proper method of use, as well as anticipated side effects, of a metered-dose inhaler were discussed and demonstrated to the patient using teach back method.     ? Allergy  > refractory to high doses of ICS/ singulair > allergy eval next step   ? Acid (or non-acid) GERD >  always difficult to exclude as up to 75% of pts in some series report no assoc GI/ Heartburn symptoms> rec continue max (24h)  acid suppression and diet restrictions/ reviewed     ? Adverse drug effects > none of the usual suspects listed   ? CHF > add lasix 20 mg x 2 until swelling better, await and if elevated > echo

## 2020-10-07 ENCOUNTER — Ambulatory Visit (HOSPITAL_COMMUNITY)
Admission: RE | Admit: 2020-10-07 | Discharge: 2020-10-07 | Disposition: A | Discharge status: Home / Self Care | Payer: BC Managed Care – PPO | Source: Ambulatory Visit | Attending: Internal Medicine | Admitting: Internal Medicine

## 2020-10-07 ENCOUNTER — Telehealth: Payer: Self-pay | Admitting: Internal Medicine

## 2020-10-07 ENCOUNTER — Ambulatory Visit: Payer: BC Managed Care – PPO

## 2020-10-07 ENCOUNTER — Other Ambulatory Visit: Payer: Self-pay | Admitting: Internal Medicine

## 2020-10-07 DIAGNOSIS — I517 Cardiomegaly: Secondary | ICD-10-CM | POA: Diagnosis not present

## 2020-10-07 DIAGNOSIS — I509 Heart failure, unspecified: Secondary | ICD-10-CM

## 2020-10-07 DIAGNOSIS — J90 Pleural effusion, not elsewhere classified: Secondary | ICD-10-CM | POA: Diagnosis not present

## 2020-10-07 DIAGNOSIS — J9811 Atelectasis: Secondary | ICD-10-CM | POA: Diagnosis not present

## 2020-10-07 DIAGNOSIS — R0602 Shortness of breath: Secondary | ICD-10-CM | POA: Insufficient documentation

## 2020-10-07 LAB — TSH: TSH: 2.87 u[IU]/mL (ref 0.35–4.50)

## 2020-10-07 LAB — D-DIMER, QUANTITATIVE: D-Dimer, Quant: 1.62 mcg/mL FEU — ABNORMAL HIGH (ref <0.50)

## 2020-10-07 MED ORDER — TECHNETIUM TO 99M ALBUMIN AGGREGATED
4.3700 | Freq: Once | INTRAVENOUS | Status: AC | PRN
  Administered 2020-10-07: 4.37 via INTRAVENOUS

## 2020-10-07 NOTE — Progress Notes (Signed)
Tried calling pt and there was no answer and no VM. Will call back 2/21 and if still no answer will mail letter.

## 2020-10-07 NOTE — Progress Notes (Signed)
Per MW he has already made pt aware of results/recs and he has the lasix already. I have made referral to cards and scan and cxr per protocol.

## 2020-10-07 NOTE — Telephone Encounter (Signed)
Pt was returning my call for VQ appt info.  I spoke to him.  Nothing further needed.

## 2020-10-10 ENCOUNTER — Encounter: Payer: Self-pay | Admitting: Internal Medicine

## 2020-10-10 ENCOUNTER — Encounter: Payer: Self-pay | Admitting: *Deleted

## 2020-10-10 NOTE — Progress Notes (Signed)
Tried calling, still NA, letter mailed.

## 2020-10-12 ENCOUNTER — Encounter: Payer: Self-pay | Admitting: Nurse Practitioner

## 2020-10-12 ENCOUNTER — Other Ambulatory Visit: Payer: Self-pay

## 2020-10-12 ENCOUNTER — Ambulatory Visit: Payer: BC Managed Care – PPO | Attending: Nurse Practitioner | Admitting: Nurse Practitioner

## 2020-10-12 VITALS — BP 129/79 | HR 110 | Temp 98.7°F | Ht 74.5 in | Wt 214.0 lb

## 2020-10-12 DIAGNOSIS — J42 Unspecified chronic bronchitis: Secondary | ICD-10-CM

## 2020-10-12 DIAGNOSIS — M7989 Other specified soft tissue disorders: Secondary | ICD-10-CM

## 2020-10-12 DIAGNOSIS — M2550 Pain in unspecified joint: Secondary | ICD-10-CM | POA: Diagnosis not present

## 2020-10-12 DIAGNOSIS — I10 Essential (primary) hypertension: Secondary | ICD-10-CM | POA: Diagnosis not present

## 2020-10-12 DIAGNOSIS — D696 Thrombocytopenia, unspecified: Secondary | ICD-10-CM

## 2020-10-12 DIAGNOSIS — K861 Other chronic pancreatitis: Secondary | ICD-10-CM

## 2020-10-12 DIAGNOSIS — L97521 Non-pressure chronic ulcer of other part of left foot limited to breakdown of skin: Secondary | ICD-10-CM | POA: Insufficient documentation

## 2020-10-12 DIAGNOSIS — Z89422 Acquired absence of other left toe(s): Secondary | ICD-10-CM

## 2020-10-12 DIAGNOSIS — D708 Other neutropenia: Secondary | ICD-10-CM

## 2020-10-12 DIAGNOSIS — I509 Heart failure, unspecified: Secondary | ICD-10-CM | POA: Diagnosis not present

## 2020-10-12 MED ORDER — FUROSEMIDE 20 MG PO TABS: ORAL_TABLET | ORAL | 2 refills | Status: DC

## 2020-10-12 MED ORDER — COLCHICINE 0.6 MG PO CAPS: 1.0000 | ORAL_CAPSULE | Freq: Every day | ORAL | 0 refills | Status: DC

## 2020-10-12 MED ORDER — MELOXICAM 7.5 MG PO TABS: 7.5000 mg | ORAL_TABLET | Freq: Every day | ORAL | 0 refills | Status: DC

## 2020-10-12 NOTE — Progress Notes (Signed)
Assessment & Plan:  Stephen Whitaker was seen today for leg swelling.  Diagnoses and all orders for this visit:  Essential hypertension Continue all antihypertensives as prescribed.  Remember to bring in your blood pressure log with you for your follow up appointment.  DASH/Mediterranean Diets are healthier choices for HTN.    Heart failure, type unknown ([place]) Follow up with Cardiology as instructed Increase furosemide to 2 tablets twice per day until seen by cardiology  Arthralgia of multiple joints -     meloxicam (MOBIC) 7.5 MG tablet; Take 1 tablet (7.5 mg total) by mouth daily.    Patient has been counseled on age-appropriate routine health concerns for screening and prevention. These are reviewed and up-to-date. Referrals have been placed accordingly. Immunizations are up-to-date or declined.    Subjective:   Chief Complaint  Patient presents with  . Leg Swelling    Patient stated he have swelling on both of his leg from waist down. Patient need medication refill.    HPI Stephen Whitaker 65 y.o. male presents to office today for follow up.  He has a past medical history of Arthritis, Boil of buttock, Chronic bronchitis ([place]), COPD (chronic obstructive pulmonary disease) ([place]), Dyspnea, GERD (gastroesophageal reflux disease), Gout, Hemorrhoids, Hypertension, Pancreatitis, Pneumonia (2016), and Tubular adenoma of colon.   He has an upcoming appointment with cardiology for new onset heart failure. Believes his heart failure may be a result of receiving the covid booster.  Today he endorses improvement in BLE edema however it has not completely resolved. I will increase his furosemide to 40 mg bid insteaed of 20 mg BID. He currently has 20 mg at home. I have instructed him to just take 2 tablets BID until he is evaluated by Cardiology. Left foot is significantly edematous compared to the right foot.   Essential Hypertension Well controlled. Will continue amlodipine 10 mg daily.  Denies chest pain, worsening shortness of breath, palpitations, lightheadedness, dizziness, headaches. BLE edema is present. He denies any gout flare pain today.  BP Readings from Last 3 Encounters:  10/14/20 126/78  10/12/20 129/79  10/06/20 (!) 142/80    Review of Systems  Constitutional: Negative for fever, malaise/fatigue and weight loss.  HENT: Negative.  Negative for nosebleeds.   Eyes: Negative.  Negative for blurred vision, double vision and photophobia.  Respiratory: Positive for shortness of breath (mild with exertion). Negative for cough.   Cardiovascular: Positive for leg swelling. Negative for chest pain and palpitations.  Gastrointestinal: Negative.  Negative for heartburn, nausea and vomiting.  Musculoskeletal: Positive for joint pain. Negative for myalgias.  Neurological: Negative.  Negative for dizziness, focal weakness, seizures and headaches.  Psychiatric/Behavioral: Negative.  Negative for suicidal ideas.    Past Medical History:  Diagnosis Date  . Arthritis   . Boil of buttock   . Chronic bronchitis ([place])   . COPD (chronic obstructive pulmonary disease) ([facility])    Chronic Bronchitis.Advair inhaler   . Dyspnea   . GERD (gastroesophageal reflux disease)    takes Protonix daily  . Gout    takes Allopurinol daily  . Hemorrhoids   . Hypertension    takes HCTZ daily  . Pancreatitis    10 years ago  . Pneumonia 2016  . Tubular adenoma of colon     Past Surgical History:  Procedure Laterality Date  . AMPUTATION Left 07/14/2014   Procedure: 2nd Toe Amputation;  Surgeon: [name], MD;  Location: [facility];  Service: Orthopedics;  Laterality: Left;  .  AMPUTATION Left 11/16/2015   Procedure: Left Great Toe Amputation at Metatarsophalangeal Joint;  Surgeon: [name], MD;  Location: [facility];  Service: Orthopedics;  Laterality: Left;  . AMPUTATION Right 06/13/2016   Procedure: RIGHT 2nd Toe Amputation;  Surgeon: [name], MD;  Location: [facility];  Service:  Orthopedics;  Laterality: Right;  . AMPUTATION Left 10/19/2016   Procedure: Amputation Left 3rd Toe;  Surgeon: [name], MD;  Location: [facility];  Service: Orthopedics;  Laterality: Left;  . TOE AMPUTATION Left 11/16/2015   left great toe   . TOE AMPUTATION Right 06/13/2016  . TONSILLECTOMY      Family History  Adopted: Yes  Problem Relation Age of Onset  . Cancer Sister 59       unknown origin     Social History Reviewed with no changes to be made today.   Outpatient Medications Prior to Visit  Medication Sig Dispense Refill  . albuterol (VENTOLIN HFA) 108 (90 Base) MCG/ACT inhaler Inhale 2 puffs into the lungs every 4 (four) hours as needed for wheezing or shortness of breath. 54 g 1  . allopurinol (ZYLOPRIM) 300 MG tablet Take 1 tablet (300 mg total) by mouth daily. 30 tablet 0  . amLODipine (NORVASC) 10 MG tablet TAKE 1 TABLET BY MOUTH ONCE DAILY **MUST  KEEP  UPCOMING  OFFICE  VISIT  FOR  REFILLS** 30 tablet 0  . Bioflavonoid Products (ESTER-C) TABS Take 1 tablet by mouth daily.    . Blood Pressure Monitoring DEVI 1 Units by Does not apply route daily. Measure blood pressure once a day 1 Units 0  . budesonide-formoterol (SYMBICORT) 160-4.5 MCG/ACT inhaler Take 2 puffs first thing in am and then another 2 puffs about 12 hours later. 10.2 g 11  . fluticasone (FLONASE) 50 MCG/ACT nasal spray Place 1 spray into both nostrils 2 (two) times daily. 49 g 11  . pantoprazole (PROTONIX) 40 MG tablet TAKE 1 TABLET BY MOUTH 30 TO 60 MINUTES BEFORE YOUR FIRST AND LAST MEALS OF THE DAY 60 tablet 5  . SPIRULINA PO Take 1 capsule by mouth 3 (three) times daily.    Marland Kitchen doxycycline (VIBRAMYCIN) 100 MG capsule Take 1 capsule (100 mg total) by mouth 2 (two) times daily. 20 capsule 0  . furosemide (LASIX) 20 MG tablet 2 daily until better then 1 daily 45 tablet 2  . ibuprofen (ADVIL) 800 MG tablet TAKE 1 TABLET BY MOUTH EVERY 8 HOURS AS NEEDED 90 tablet 0  . montelukast (SINGULAIR) 10 MG tablet TAKE 1  TABLET AT BEDTIME 90 tablet 3  . Colchicine 0.6 MG CAPS Take 1 capsule by mouth daily for 1 dose. 6 capsule 0   No facility-administered medications prior to visit.    Allergies  Allergen Reactions  . Ciprofloxacin Rash and Other (See Comments)    SYNCOPE   . Shellfish Allergy Anaphylaxis  . Sulfa Antibiotics Other (See Comments)    SYNCOPE DIZZINESS        Objective:    BP 129/79 (BP Location: Left Arm, Patient Position: Sitting, Cuff Size: Normal)   Pulse (!) 110   Temp 98.7 F (37.1 C) (Oral)   Ht 6' 2.5" (1.892 m)   Wt 214 lb (97.1 kg)   SpO2 98%   BMI 27.11 kg/m  Wt Readings from Last 3 Encounters:  10/14/20 215 lb 3.2 oz (97.6 kg)  10/12/20 214 lb (97.1 kg)  10/06/20 225 lb 3.2 oz (102.2 kg)    Physical Exam  Vitals and nursing note reviewed.  Constitutional:      Appearance: He is well-developed and well-nourished.  HENT:     Head: Normocephalic and atraumatic.  Eyes:     Extraocular Movements: EOM normal.  Cardiovascular:     Rate and Rhythm: Regular rhythm. Tachycardia present.     Pulses: Intact distal pulses.     Heart sounds: Normal heart sounds. No murmur heard. No friction rub. No gallop.   Pulmonary:     Effort: Pulmonary effort is normal. No tachypnea or respiratory distress.     Breath sounds: Normal breath sounds. No decreased breath sounds, wheezing, rhonchi or rales.  Chest:     Chest wall: No tenderness.  Abdominal:     General: Bowel sounds are normal.     Palpations: Abdomen is soft.  Musculoskeletal:        General: No edema. Normal range of motion.     Cervical back: Normal range of motion.  Skin:    General: Skin is warm and dry.  Neurological:     Mental Status: He is alert and oriented to person, place, and time.     Coordination: Coordination normal.  Psychiatric:        Mood and Affect: Mood and affect normal.        Behavior: Behavior normal. Behavior is cooperative.        Thought Content: Thought content normal.         Judgment: Judgment normal.          Patient has been counseled extensively about nutrition and exercise as well as the importance of adherence with medications and regular follow-up. The patient was given clear instructions to go to ER or return to medical center if symptoms don't improve, worsen or new problems develop. The patient verbalized understanding.   Follow-up: Return in about 3 months (around 01/09/2021) for 2 weeks BMP. [name], FNP-BC [facility] and [facility], [place]   10/15/2020, 10:26 PM

## 2020-10-13 ENCOUNTER — Telehealth: Payer: Self-pay | Admitting: Nurse Practitioner

## 2020-10-13 NOTE — Telephone Encounter (Signed)
Copied from [place] [id number]. Topic: General - Other >> Oct 12, 2020  4:35 PM Pawlus, Brayton Layman A wrote: Reason for CRM: [facility] needed clarification regarding Colchicine 0.6 MG CAPS. It states take one daily but only prescribed a total of 6 pills. Please advise.

## 2020-10-14 ENCOUNTER — Other Ambulatory Visit: Payer: Self-pay | Admitting: Nurse Practitioner

## 2020-10-14 ENCOUNTER — Other Ambulatory Visit: Payer: Self-pay

## 2020-10-14 ENCOUNTER — Ambulatory Visit (INDEPENDENT_AMBULATORY_CARE_PROVIDER_SITE_OTHER): Payer: BC Managed Care – PPO

## 2020-10-14 ENCOUNTER — Ambulatory Visit (INDEPENDENT_AMBULATORY_CARE_PROVIDER_SITE_OTHER): Payer: BC Managed Care – PPO | Admitting: Internal Medicine

## 2020-10-14 ENCOUNTER — Encounter: Payer: Self-pay | Admitting: Radiology

## 2020-10-14 ENCOUNTER — Encounter: Payer: Self-pay | Admitting: Internal Medicine

## 2020-10-14 VITALS — BP 126/78 | HR 109 | Ht 74.5 in | Wt 215.2 lb

## 2020-10-14 DIAGNOSIS — I4719 Other supraventricular tachycardia: Secondary | ICD-10-CM

## 2020-10-14 DIAGNOSIS — R9431 Abnormal electrocardiogram [ECG] [EKG]: Secondary | ICD-10-CM

## 2020-10-14 DIAGNOSIS — I471 Supraventricular tachycardia: Secondary | ICD-10-CM | POA: Diagnosis not present

## 2020-10-14 DIAGNOSIS — I509 Heart failure, unspecified: Secondary | ICD-10-CM

## 2020-10-14 DIAGNOSIS — I5020 Unspecified systolic (congestive) heart failure: Secondary | ICD-10-CM | POA: Insufficient documentation

## 2020-10-14 MED ORDER — COLCHICINE 0.6 MG PO CAPS: 1.0000 | ORAL_CAPSULE | Freq: Every day | ORAL | 0 refills | Status: DC

## 2020-10-14 MED ORDER — FUROSEMIDE 40 MG PO TABS: 40.0000 mg | ORAL_TABLET | Freq: Two times a day (BID) | ORAL | 3 refills | Status: DC

## 2020-10-14 NOTE — Progress Notes (Signed)
Enrolled patient for a 14 day Zio XT Monitor to be mailed to patients home  

## 2020-10-14 NOTE — Progress Notes (Signed)
Cardiology Office Note:    Date:  10/14/2020   ID:  Stephen Whitaker, Stephen Whitaker 1956-01-08, MRN [id number]  PCP:  [name], NP   [facility]  Cardiologist:  Rudean Haskell MD Advanced Practice Provider:  No care team member to display Electrophysiologist:  None       CC: Referral after LE swelling Consulted for the evaluation of CHF at the behest of Dr. Melvyn Novas  History of Present Illness:    Stephen Whitaker is a 65 y.o. male with a hx of HTN, COPD, GERD, moderate TR, Gout who presents for evaluation 10/14/20.  Patient notes that he is feeling OK, but has been having some breathing issues.  Has had no chest pain, chest pressure, chest tightness, chest stinging.  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  Thought he just needed to use his rescue inhaler.  This time, is was accompanied by chronic leg swelling.  Notes a relatively quick 25  Lbs weight gain.  Initially thought this may have been related to a COVID-19 booster short.  Had difficulty driving a forklift at work with minimal exertion.  Notes bendopyea.Marland Kitchen No PND or orthopnea.  Notes abdominal swelling and swelling near the penis.  Saw his PCP and Pulmonary and lasix to 20 mg PO BID.  With this has lost 10 lbs.    Went to urgent care as well; has had NM V/Q study that did not show evidence of PE.  Also has had persistent tachycardia.  Has had a decrease in his sodium.  Feels no palpitations or that his heart is out of rhythm.  Heart rate in 2/11. 2/17, 2/23 was ~ 110 bpm.  Patient reports prior cardiac testing including 2016 echo.   Past Medical History:  Diagnosis Date  . Arthritis   . Boil of buttock   . Chronic bronchitis ([place])   . COPD (chronic obstructive pulmonary disease) (HCC)    Chronic Bronchitis.Advair inhaler   . Dyspnea   . GERD (gastroesophageal reflux disease)    takes Protonix daily  . Gout     takes Allopurinol daily  . Hemorrhoids   . Hypertension    takes HCTZ daily  . Pancreatitis    10 years ago  . Pneumonia 2016  . Tubular adenoma of colon     Past Surgical History:  Procedure Laterality Date  . AMPUTATION Left 07/14/2014   Procedure: 2nd Toe Amputation;  Surgeon: [name], MD;  Location: [facility];  Service: Orthopedics;  Laterality: Left;  . AMPUTATION Left 11/16/2015   Procedure: Left Great Toe Amputation at Metatarsophalangeal Joint;  Surgeon: [name], MD;  Location: [facility];  Service: Orthopedics;  Laterality: Left;  . AMPUTATION Right 06/13/2016   Procedure: RIGHT 2nd Toe Amputation;  Surgeon: [name], MD;  Location: [facility];  Service: Orthopedics;  Laterality: Right;  . AMPUTATION Left 10/19/2016   Procedure: Amputation Left 3rd Toe;  Surgeon: [name], MD;  Location: [facility];  Service: Orthopedics;  Laterality: Left;  . TOE AMPUTATION Left 11/16/2015   left great toe   . TOE AMPUTATION Right 06/13/2016  . TONSILLECTOMY      Current Medications: Current Meds  Medication Sig  . albuterol (VENTOLIN HFA) 108 (90 Base) MCG/ACT inhaler Inhale 2 puffs into the lungs every 4 (four) hours as needed for wheezing or shortness of breath.  . allopurinol (ZYLOPRIM) 300  MG tablet Take 1 tablet (300 mg total) by mouth daily.  Marland Kitchen amLODipine (NORVASC) 10 MG tablet TAKE 1 TABLET BY MOUTH ONCE DAILY **MUST  KEEP  UPCOMING  OFFICE  VISIT  FOR  REFILLS**  . Bioflavonoid Products (ESTER-C) TABS Take 1 tablet by mouth daily.  . Blood Pressure Monitoring DEVI 1 Units by Does not apply route daily. Measure blood pressure once a day  . budesonide-formoterol (SYMBICORT) 160-4.5 MCG/ACT inhaler Take 2 puffs first thing in am and then another 2 puffs about 12 hours later.  . fluticasone (FLONASE) 50 MCG/ACT nasal spray Place 1 spray into both nostrils 2 (two) times daily.  . furosemide (LASIX) 40 MG tablet Take 1 tablet (40 mg total) by mouth 2 (two) times daily.  Marland Kitchen loratadine  (CLARITIN) 10 MG tablet Take 10 mg by mouth daily.  . meloxicam (MOBIC) 7.5 MG tablet Take 1 tablet (7.5 mg total) by mouth daily.  . montelukast (SINGULAIR) 10 MG tablet TAKE 1 TABLET AT BEDTIME  . pantoprazole (PROTONIX) 40 MG tablet TAKE 1 TABLET BY MOUTH 30 TO 60 MINUTES BEFORE YOUR FIRST AND LAST MEALS OF THE DAY  . SPIRULINA PO Take 1 capsule by mouth 3 (three) times daily.  . [DISCONTINUED] furosemide (LASIX) 20 MG tablet Take 2 tablets by mouth twice daily.     Allergies:   Ciprofloxacin, Shellfish allergy, and Sulfa antibiotics   Social History   Socioeconomic History  . Marital status: Single    Spouse name: Not on file  . Number of children: 1  . Years of education:    . Highest education level: Not on file  Occupational History  . Not on file  Tobacco Use  . Smoking status: Never Smoker  . Smokeless tobacco: Never Used  Vaping Use  . Vaping Use: Never used  Substance and Sexual Activity  . Alcohol use: Yes    Alcohol/week: 0.0 standard drinks    Comment: rarely  . Drug use: No  . Sexual activity: Not Currently  Other Topics Concern  . Not on file  Social History Narrative  . Not on file   Social Determinants of Health   Financial Resource Strain: Not on file  Food Insecurity: Not on file  Transportation Needs: Not on file  Physical Activity: Not on file  Stress: Not on file  Social Connections: Not on file    Social: Works at a Warehouse  Family History: The patient's family history includes Cancer (age of onset: 25) in his sister. He was adopted.  ROS:   Please see the history of present illness.     All other systems reviewed and are negative.  EKGs/Labs/Other Studies Reviewed:    The following studies were reviewed today: EKG:   10/14/20: Cannot exclude Atrial Tachycardia  Notable LVH; consider Echo Contrast with study 120/25/2017: SR rate 72 notable LVH different p wave morphology from 09/24/20  Transthoracic Echocardiogram: Date:  11/09/2014 Results: Study Conclusions  - Left ventricle: The cavity size was normal. Wall thickness was  normal. Systolic function was normal. The estimated ejection  fraction was in the range of 55% to 60%. Wall motion was normal;  there were no regional wall motion abnormalities. Doppler  parameters are consistent with abnormal left ventricular  relaxation (grade 1 diastolic dysfunction).  - Mitral valve: There was mild regurgitation.  - Right atrium: The atrium was mildly dilated.  - Tricuspid valve: There was moderate regurgitation.  - Pulmonary arteries: PA peak pressure: 34 mm Hg (S).  NM V/Q Scan Date: 10/07/2020 Physiologic distribution of radiopharmaceutical throughout both lungs. Slight heterogeneity of radiotracer without segmental defect.  IMPRESSION: Low probability of pulmonary embolism.  Recent Labs: 10/06/2020: BUN 23; Creatinine, Ser 1.27; Hemoglobin 12.0; Platelets 338.0; Potassium 3.7; Pro B Natriuretic peptide (BNP) 1,459.0; Sodium 140; TSH 2.87  Recent Lipid Panel    Component Value Date/Time   CHOL 155 06/26/2019 1035   TRIG 121 06/26/2019 1035   HDL 39 (L) 06/26/2019 1035   CHOLHDL 4.0 06/26/2019 1035   CHOLHDL 3.3 08/10/2014 1023   VLDL 15 08/10/2014 1023   LDLCALC 94 06/26/2019 1035    Risk Assessment/Calculations:     The 10-year ASCVD risk score Mikey Bussing DC Brooke Bonito., et al., 2013) is: 15.6%   Values used to calculate the score:     Age: 56 years     Sex: Male     Is Non-Hispanic African American: Yes     Diabetic: No     Tobacco smoker: No     Systolic Blood Pressure: 956 mmHg     Is BP treated: Yes     HDL Cholesterol: 39 mg/dL     Total Cholesterol: 155 mg/dL   Physical Exam:    VS:  BP 126/78   Pulse (!) 109   Ht 6' 2.5" (1.892 m)   Wt 215 lb 3.2 oz (97.6 kg)   SpO2 98%   BMI 27.26 kg/m     Wt Readings from Last 3 Encounters:  10/14/20 215 lb 3.2 oz (97.6 kg)  10/12/20 214 lb (97.1 kg)  10/06/20 225 lb 3.2 oz (102.2 kg)      GEN: Well nourished, well developed in no acute distress HEENT: Normal NECK: No JVD; No carotid bruits LYMPHATICS: No lymphadenopathy CARDIAC: Regular tachycardia, no murmurs, rubs, gallops RESPIRATORY:  Clear to auscultation without rales, wheezing or rhonchi  ABDOMEN: Soft, non-tender, non-distended MUSCULOSKELETAL:  +3 edema; No deformity  SKIN: Warm and dry NEUROLOGIC:  Alert and oriented x 3 PSYCHIATRIC:  Normal affect   ASSESSMENT:    1. Heart failure, type unknown ([place])   2. Nonspecific abnormal electrocardiogram (ECG) (EKG)   3. Atrial tachycardia (HCC)    PLAN:    In order of problems listed above:  Heart Failure NOS LVH  - NYHA class III, Stage C, hypervolemic, etiology from unknown cause- concern for hypertrophy given ECG - Diuretic regimen: Lasix increase to 40 mg PO BID - Strict I/Os, daily weights, and fluid restriction of < 2 L  -  BMP/BNP/Mg in one week - TTE ordered   Query or Atrial Tachycardia - will place 14 day non-live ziopatch - low threshold to add AD nodal agent next  Left leg ulcer on his foot - PAD assessment at next visit  Six weeks follow up unless new symptoms or abnormal test results warranting change in plan  Would be reasonable for  Video Visit Follow up Would be reasonable for  APP Follow up  Medication Adjustments/Labs and Tests Ordered: Current medicines are reviewed at length with the patient today.  Concerns regarding medicines are outlined above.  Orders Placed This Encounter  Procedures  . Basic metabolic panel  . Pro b natriuretic peptide (BNP)  . Magnesium  . LONG TERM MONITOR (3-14 DAYS)  . EKG 12-Lead  . ECHOCARDIOGRAM COMPLETE   Meds ordered this encounter  Medications  . furosemide (LASIX) 40 MG tablet    Sig: Take 1 tablet (40 mg total) by mouth 2 (two) times daily.  Dispense:  180 tablet    Refill:  3    Patient Instructions  Medication Instructions:  Your physician has recommended you make the  following change in your medication:  INCREASE: furosemdie (Lasix) to 40mg  by mouth twice daily *If you need a refill on your cardiac medications before your next appointment, please call your pharmacy*   Lab Work: 7-10 DAYS: BNP, BMP, MG If you have labs (blood work) drawn today and your tests are completely normal, you will receive your results only by: Marland Kitchen MyChart Message (if you have MyChart) OR . A paper copy in the mail If you have any lab test that is abnormal or we need to change your treatment, we will call you to review the results.   Testing/Procedures: Your physician has requested that you have an echocardiogram. Echocardiography is a painless test that uses sound waves to create images of your heart. It provides your doctor with information about the size and shape of your heart and how well your heart's chambers and valves are working. This procedure takes approximately one hour. There are no restrictions for this procedure.  Your physician has requested that you wear a 14 day heart monitor.     Follow-Up: At [facility], you and your health needs are our priority.  As part of our continuing mission to provide you with exceptional heart care, we have created designated Provider Care Teams.  These Care Teams include your primary Cardiologist (physician) and Advanced Practice Providers (APPs -  Physician Assistants and Nurse Practitioners) who all work together to provide you with the care you need, when you need it.  We recommend signing up for the patient portal called "MyChart".  Sign up information is provided on this After Visit Summary.  MyChart is used to connect with patients for Virtual Visits (Telemedicine).  Patients are able to view lab/test results, encounter notes, upcoming appointments, etc.  Non-urgent messages can be sent to your provider as well.   To learn more about what you can do with MyChart, go to [email or web address].    Your next appointment:   6  week(s)  The format for your next appointment:   In Person  Provider:   You may see Gasper Sells, MD or one of the following Advanced Practice Providers on your designated Care Team:    Melina Copa, PA-C  Ermalinda Barrios, PA-C    Other Instructions Bryn Gulling- Long Term Monitor Instructions   Your physician has requested you wear your ZIO patch monitor__14__days.   This is a single patch monitor.  [employer] supplies one patch monitor per enrollment.  Additional stickers are not available.   Please do not apply patch if you will be having a Nuclear Stress Test, Echocardiogram, Cardiac CT, MRI, or Chest Xray during the time frame you would be wearing the monitor. The patch cannot be worn during these tests.  You cannot remove and re-apply the ZIO XT patch monitor.   Your ZIO patch monitor will be sent USPS Priority mail from [facility] directly to your home address. The monitor may also be mailed to a PO BOX if home delivery is not available.   It may take 3-5 days to receive your monitor after you have been enrolled.   Once you have received you monitor, please review enclosed instructions.  Your monitor has already been registered assigning a specific monitor serial # to you.   Applying the monitor   Shave hair from upper left chest.   Hold abrader disc  by orange tab.  Rub abrader in 40 strokes over left upper chest as indicated in your monitor instructions.   Clean area with 4 enclosed alcohol pads .  Use all pads to assure are is cleaned thoroughly.  Let dry.   Apply patch as indicated in monitor instructions.  Patch will be place under collarbone on left side of chest with arrow pointing upward.   Rub patch adhesive wings for 2 minutes.Remove white label marked "1".  Remove white label marked "2".  Rub patch adhesive wings for 2 additional minutes.   While looking in a mirror, press and release button in center of patch.  A small green light will flash 3-4 times .  This  will be your only indicator the monitor has been turned on.     Do not shower for the first 24 hours.  You may shower after the first 24 hours.   Press button if you feel a symptom. You will hear a small click.  Record Date, Time and Symptom in the Patient Log Book.   When you are ready to remove patch, follow instructions on last 2 pages of Patient Log Book.  Stick patch monitor onto last page of Patient Log Book.   Place Patient Log Book in [place] box.  Use locking tab on box and tape box closed securely.  The [employer] and [employer] has [employer] on it.  Please place in mailbox as soon as possible.  Your physician should have your test results approximately 7 days after the monitor has been mailed back to [facility].   Call [place] at [phone] if you have questions regarding your ZIO XT patch monitor.  Call them immediately if you see an orange light blinking on your monitor.   If your monitor falls off in less than 4 days contact our Monitor department at [phone].  If your monitor becomes loose or falls off after 4 days call [employer] at [phone] for suggestions on securing your monitor.       Signed, [name], MD  10/14/2020 4:29 PM    [place]

## 2020-10-14 NOTE — Patient Instructions (Signed)
Medication Instructions:  Your physician has recommended you make the following change in your medication:  INCREASE: furosemdie (Lasix) to 40mg  by mouth twice daily *If you need a refill on your cardiac medications before your next appointment, please call your pharmacy*   Lab Work: 7-10 DAYS: BNP, BMP, MG If you have labs (blood work) drawn today and your tests are completely normal, you will receive your results only by: Marland Kitchen MyChart Message (if you have MyChart) OR . A paper copy in the mail If you have any lab test that is abnormal or we need to change your treatment, we will call you to review the results.   Testing/Procedures: Your physician has requested that you have an echocardiogram. Echocardiography is a painless test that uses sound waves to create images of your heart. It provides your doctor with information about the size and shape of your heart and how well your heart's chambers and valves are working. This procedure takes approximately one hour. There are no restrictions for this procedure.  Your physician has requested that you wear a 14 day heart monitor.     Follow-Up: At [facility], you and your health needs are our priority.  As part of our continuing mission to provide you with exceptional heart care, we have created designated Provider Care Teams.  These Care Teams include your primary Cardiologist (physician) and Advanced Practice Providers (APPs -  Physician Assistants and Nurse Practitioners) who all work together to provide you with the care you need, when you need it.  We recommend signing up for the patient portal called "MyChart".  Sign up information is provided on this After Visit Summary.  MyChart is used to connect with patients for Virtual Visits (Telemedicine).  Patients are able to view lab/test results, encounter notes, upcoming appointments, etc.  Non-urgent messages can be sent to your provider as well.   To learn more about what you can do with  MyChart, go to [email or web address].    Your next appointment:   6 week(s)  The format for your next appointment:   In Person  Provider:   You may see Gasper Sells, MD or one of the following Advanced Practice Providers on your designated Care Team:    Melina Copa, PA-C  Ermalinda Barrios, PA-C    Other Instructions Bryn Gulling- Long Term Monitor Instructions   Your physician has requested you wear your ZIO patch monitor__14__days.   This is a single patch monitor.  [employer] supplies one patch monitor per enrollment.  Additional stickers are not available.   Please do not apply patch if you will be having a Nuclear Stress Test, Echocardiogram, Cardiac CT, MRI, or Chest Xray during the time frame you would be wearing the monitor. The patch cannot be worn during these tests.  You cannot remove and re-apply the ZIO XT patch monitor.   Your ZIO patch monitor will be sent USPS Priority mail from [facility] directly to your home address. The monitor may also be mailed to a PO BOX if home delivery is not available.   It may take 3-5 days to receive your monitor after you have been enrolled.   Once you have received you monitor, please review enclosed instructions.  Your monitor has already been registered assigning a specific monitor serial # to you.   Applying the monitor   Shave hair from upper left chest.   Hold abrader disc by orange tab.  Rub abrader in 40 strokes over left upper chest as indicated in your  monitor instructions.   Clean area with 4 enclosed alcohol pads .  Use all pads to assure are is cleaned thoroughly.  Let dry.   Apply patch as indicated in monitor instructions.  Patch will be place under collarbone on left side of chest with arrow pointing upward.   Rub patch adhesive wings for 2 minutes.Remove white label marked "1".  Remove white label marked "2".  Rub patch adhesive wings for 2 additional minutes.   While looking in a mirror, press and release button  in center of patch.  A small green light will flash 3-4 times .  This will be your only indicator the monitor has been turned on.     Do not shower for the first 24 hours.  You may shower after the first 24 hours.   Press button if you feel a symptom. You will hear a small click.  Record Date, Time and Symptom in the Patient Log Book.   When you are ready to remove patch, follow instructions on last 2 pages of Patient Log Book.  Stick patch monitor onto last page of Patient Log Book.   Place Patient Log Book in [place] box.  Use locking tab on box and tape box closed securely.  The [employer] has [employer] on it.  Please place in mailbox as soon as possible.  Your physician should have your test results approximately 7 days after the monitor has been mailed back to [facility].   Call [place] at [phone] if you have questions regarding your ZIO XT patch monitor.  Call them immediately if you see an orange light blinking on your monitor.   If your monitor falls off in less than 4 days contact our Monitor department at [phone].  If your monitor becomes loose or falls off after 4 days call [employer] at [phone] for suggestions on securing your monitor.

## 2020-10-15 ENCOUNTER — Encounter: Payer: Self-pay | Admitting: Nurse Practitioner

## 2020-10-19 DIAGNOSIS — R9431 Abnormal electrocardiogram [ECG] [EKG]: Secondary | ICD-10-CM

## 2020-10-19 DIAGNOSIS — I509 Heart failure, unspecified: Secondary | ICD-10-CM

## 2020-10-24 ENCOUNTER — Other Ambulatory Visit: Payer: Self-pay

## 2020-10-24 ENCOUNTER — Other Ambulatory Visit: Payer: BC Managed Care – PPO | Admitting: *Deleted

## 2020-10-24 DIAGNOSIS — I509 Heart failure, unspecified: Secondary | ICD-10-CM | POA: Diagnosis not present

## 2020-10-25 ENCOUNTER — Telehealth: Payer: Self-pay | Admitting: Nurse Practitioner

## 2020-10-25 LAB — BASIC METABOLIC PANEL
BUN/Creatinine Ratio: 16 (ref 10–24)
BUN: 21 mg/dL (ref 8–27)
CO2: 23 mmol/L (ref 20–29)
Calcium: 8.3 mg/dL — ABNORMAL LOW (ref 8.6–10.2)
Chloride: 101 mmol/L (ref 96–106)
Creatinine, Ser: 1.31 mg/dL — ABNORMAL HIGH (ref 0.76–1.27)
Glucose: 84 mg/dL (ref 65–99)
Potassium: 4 mmol/L (ref 3.5–5.2)
Sodium: 138 mmol/L (ref 134–144)
eGFR: 60 mL/min/{1.73_m2} (ref >59)

## 2020-10-25 LAB — PRO B NATRIURETIC PEPTIDE: NT-Pro BNP: 7418 pg/mL — ABNORMAL HIGH (ref 0–376)

## 2020-10-25 LAB — MAGNESIUM: Magnesium: 1.5 mg/dL — ABNORMAL LOW (ref 1.6–2.3)

## 2020-10-25 NOTE — Telephone Encounter (Signed)
Copied from [place] [id number]. Topic: General - Other >> Oct 25, 2020  1:55 PM Leward Quan A wrote: Reason for CRM: [facility] in to inform [name] that they are not able to do an appointment with the patient say first referral he made the appointment then cancelled. Next referral he nmade the appointment but no showed. And they can not reach the patient. Any questions please call   Ph# [phone]

## 2020-10-26 ENCOUNTER — Ambulatory Visit: Payer: BC Managed Care – PPO | Admitting: Pharmacist

## 2020-10-28 ENCOUNTER — Telehealth: Payer: Self-pay

## 2020-10-28 ENCOUNTER — Telehealth: Payer: Self-pay | Admitting: Internal Medicine

## 2020-10-28 MED ORDER — MAGNESIUM OXIDE 400 MG PO CAPS: 400.0000 mg | ORAL_CAPSULE | Freq: Every day | ORAL | 3 refills | Status: DC

## 2020-10-28 NOTE — Telephone Encounter (Signed)
Called patient notified him of test results.  Inquired about SOB he expresses that SOB has improved.  He is currently not checking daily weight I encouraged him to try and do this he is agreeable to try.  He verbalizes understanding that he is to take magnesium oxide 400 mg PO QD and that this is an OTC medication.  No further questions/ concerns.

## 2020-10-28 NOTE — Telephone Encounter (Signed)
Outpatient Medication Detail   Disp Refills Start End   Magnesium Oxide 400 MG CAPS 90 capsule 3 10/28/2020    Sig - Route: Take 1 capsule (400 mg total) by mouth daily. - Oral   Sent to pharmacy as: Magnesium Oxide 400 MG Cap   E-Prescribing Status: Receipt confirmed by pharmacy (10/28/2020 12:39 PM EST)     Pharmacy  [place], [place]

## 2020-10-28 NOTE — Telephone Encounter (Signed)
See documentation, pharmacy requesting to change rx to tablets. Just wanted to be sure ok as it looks like you had discussed with the patient that this would be an OTC medication.

## 2020-10-28 NOTE — Telephone Encounter (Signed)
Kayla with [facility] is requesting to have this Rx resubmitted for tablets instead of capsules.  If questions, return call to [facility] at [phone]  *STAT* If patient is at the pharmacy, call can be transferred to refill team.   1. Which medications need to be refilled? (please list name of each medication and dose if known) Magnesium Oxide 400 MG tablet  2. Which pharmacy/location (including street and city if local pharmacy) is medication to be sent to?  [place] [address]  3. Do they need a 30 day or 90 day supply? 90 day supply

## 2020-10-28 NOTE — Telephone Encounter (Signed)
-----   Message from [name], MD sent at 10/25/2020  1:43 PM EST ----- Results: Stable creatinine Calcium, low magnesium; elevated BNP Plan: Start Magnesium Oxide 400 mg PO Daily If patient is having worse SOB, we may need to increase his diuretic  [name], MD

## 2020-10-28 NOTE — Telephone Encounter (Signed)
Rx change from magnesium oxide 400 capsules to tablets is okay.

## 2020-10-30 NOTE — Telephone Encounter (Signed)
NOTED

## 2020-10-31 ENCOUNTER — Encounter: Payer: Self-pay | Admitting: Orthopedic Surgery

## 2020-10-31 ENCOUNTER — Other Ambulatory Visit: Payer: Self-pay

## 2020-10-31 ENCOUNTER — Ambulatory Visit (INDEPENDENT_AMBULATORY_CARE_PROVIDER_SITE_OTHER): Payer: BC Managed Care – PPO | Admitting: Physician Assistant

## 2020-10-31 VITALS — Ht 74.5 in | Wt 215.0 lb

## 2020-10-31 DIAGNOSIS — T148XXA Other injury of unspecified body region, initial encounter: Secondary | ICD-10-CM | POA: Diagnosis not present

## 2020-10-31 MED ORDER — DOXYCYCLINE HYCLATE 100 MG PO TABS: 100.0000 mg | ORAL_TABLET | Freq: Two times a day (BID) | ORAL | 0 refills | Status: DC

## 2020-10-31 NOTE — Progress Notes (Signed)
Office Visit Note   Patient: Stephen Whitaker           Date of Birth: 11-21-1955           MRN: [id number] Visit Date: 10/31/2020              Requested by: [name], NP [address] [place],   [address] PCP: [name], NP  Chief Complaint  Patient presents with  . Left Foot - Follow-up      HPI: Patient presents today for follow-up on his left foot.  He has a history of transmetatarsal amputation.  He has had an ulcer beneath his forefoot.  Unfortunately recently he has had significant bilateral lower extremity swelling and it is caused him to get a blister on the medial side of his left foot he thinks this is secondary to wearing his work shoes and it rubbing on the side of the shoe with increased swelling.  He is currently on an increased dose of diuretic and is improving.  Assessment & Plan: Visit Diagnoses: No diagnosis found.  Plan: Ideally I would like to place him in a Profore wrap but he states that he has to work and has to wear his steel toed shoes.  I will place him on an antibiotic.  I have also placed pressure relieving doughnuts in his shoe and to tape onto the medial side of his foot.  He should wear a postop shoe when not at work.  I have provided him. xxl compression sock which she should change daily he is to wash his foot daily and will contact us if he has any is.  He should try to elevate as much as he can  Follow-Up Instructions: No follow-ups on file.   Ortho Exam  Patient is alert, oriented, no adenopathy, well-dressed, normal affect, normal respiratory effort. Examination he does not have any ascending cellulitis he has a stable ulcer on the plantar forefoot that measures about 3 cm in diameter there is no foul odor does not probe deeply after obtaining verbal consent trimmed off the macerated trim skin.  On the medial side of his foot he has a large blood blister.  This was debrided to healthy skin.  Does not probe to bone.  No  surrounding cellulitis or foul odor  Imaging: No results found. No images are attached to the encounter.  Labs: Lab Results  Component Value Date   HGBA1C 5.6 08/10/2014   ESRSEDRATE 27 (H) 07/05/2015   ESRSEDRATE 80 (H) 01/21/2015   CRP 1.8 (H) 07/05/2015   LABURIC 5.9 07/28/2020   LABURIC 5.4 02/19/2018   LABURIC 6.0 08/10/2014   REPTSTATUS 01/22/2015 FINAL 01/21/2015   CULT NO GROWTH Performed at [facility]  01/21/2015     Lab Results  Component Value Date   ALBUMIN 3.9 06/26/2019   ALBUMIN 3.9 10/22/2018   ALBUMIN 4.1 06/23/2018   LABURIC 5.9 07/28/2020   LABURIC 5.4 02/19/2018   LABURIC 6.0 08/10/2014    Lab Results  Component Value Date   MG 1.5 (L) 10/24/2020   MG 1.8 01/21/2015   Lab Results  Component Value Date   VD25OH 32.4 01/23/2017    No results found for: PREALBUMIN CBC EXTENDED Latest Ref Rng & Units 10/06/2020 06/26/2019 10/22/2018  WBC 4.0 - 10.5 K/uL 5.4 2.8(L) 2.5(LL)  RBC 4.22 - 5.81 Mil/uL 4.46 4.14 4.30  HGB 13.0 - 17.0 g/dL 12.0(L) 11.9(L) 11.7(L)  HCT 39.0 - 52.0 % 37.3(L)  36.5(L) 36.7(L)  PLT 150.0 - 400.0 K/uL 338.0 247 219  NEUTROABS 1.4 - 7.7 K/uL 4.6 1.3(L) -  LYMPHSABS 0.7 - 4.0 K/uL 0.5(L) 0.9 -     Body mass index is 27.24 kg/m.  Orders:  No orders of the defined types were placed in this encounter.  Meds ordered this encounter  Medications  . doxycycline (VIBRA-TABS) 100 MG tablet    Sig: Take 1 tablet (100 mg total) by mouth 2 (two) times daily.    Dispense:  30 tablet    Refill:  0     Procedures: No procedures performed  Clinical Data: No additional findings.  ROS:  All other systems negative, except as noted in the HPI. Review of Systems  Objective: Vital Signs: Ht 6' 2.5" (1.892 m)   Wt 215 lb (97.5 kg)   BMI 27.24 kg/m   Specialty Comments:  No specialty comments available.  PMFS History: Patient Active Problem List   Diagnosis Date Noted  . Heart failure, type unknown ([place])  10/14/2020  . Nonspecific abnormal electrocardiogram (ECG) (EKG) 10/14/2020  . Atrial tachycardia ([place]) 10/14/2020  . Non-pressure chronic ulcer of other part of left foot limited to breakdown of skin ([place]) 10/12/2020  . Leg swelling 10/06/2020  . DOE (dyspnea on exertion) 10/06/2020  . Thrombocytopenia ([place]) 06/26/2019  . Acquired absence of other right toe(s) ([place]) 02/19/2018  . Boil of buttock 10/24/2016  . Amputated toe of left foot ([place]) 10/19/2016  . Amputated toe, right ([place]) 06/13/2016  . Chronic bronchitis ([place]) 11/24/2015  . Other pancytopenia ([place]) 01/24/2015  . Chronic pancreatitis ([place]) 01/24/2015  . Blood poisoning   . Neutropenia ([place])   . Iron deficiency anemia due to chronic blood loss   . Leukopenia   . Sepsis ([place]) 01/21/2015  . Leucopenia 01/21/2015  . Anemia 01/21/2015  . Syncope 01/21/2015  . Diarrhea 01/21/2015  . Hypokalemia 01/21/2015  . Loss of consciousness ([place])   . Eczema 10/21/2014  . Essential hypertension 10/21/2014  . Vasovagal syncope 10/21/2014  . Bilateral conjunctivitis 08/10/2014  . Other chronic pancreatitis ([place]) 08/10/2014  . COLD (chronic obstructive lung disease) ([place]) 08/10/2014  . Chronic ethmoidal sinusitis 08/10/2014  . Bronchitis 10/17/2010  . PLANTAR FASCIITIS, LEFT 10/17/2010  . OTHER NEUTROPENIA 04/21/2010  . CALLUS, TOE 03/15/2010  . LEUKOPENIA, MILD 02/20/2010  . LIVER FUNCTION TESTS, ABNORMAL, HX OF 02/16/2010  . DENTAL CARIES 02/15/2010  . ONYCHOMYCOSIS 07/05/2009  . ALLERGIC URTICARIA 07/05/2009  . PAIN IN LIMB 07/05/2009  . ERECTILE DYSFUNCTION 05/25/2008  . INFLUENZA 05/25/2008  . VIRAL INFECTION 10/01/2007  . HSV 05/02/2007  . CONDYLOMA ACUMINATUM 05/02/2007  . HYPERTENSION 05/02/2007  . ALLERGIC RHINITIS 05/02/2007  . Chronic asthma, mild persistent, uncomplicated 43/15/4008  . Gastroesophageal reflux disease without esophagitis 05/02/2007  . PANCREATITIS 05/02/2007   Past Medical History:  Diagnosis  Date  . Arthritis   . Boil of buttock   . Chronic bronchitis ([place])   . COPD (chronic obstructive pulmonary disease) (HCC)    Chronic Bronchitis.Advair inhaler   . Dyspnea   . GERD (gastroesophageal reflux disease)    takes Protonix daily  . Gout    takes Allopurinol daily  . Hemorrhoids   . Hypertension    takes HCTZ daily  . Pancreatitis    10 years ago  . Pneumonia 2016  . Tubular adenoma of colon     Family History  Adopted: Yes  Problem Relation Age of Onset  . Cancer Sister 34  unknown origin     Past Surgical History:  Procedure Laterality Date  . AMPUTATION Left 07/14/2014   Procedure: 2nd Toe Amputation;  Surgeon: [name], MD;  Location: [facility];  Service: Orthopedics;  Laterality: Left;  . AMPUTATION Left 11/16/2015   Procedure: Left Great Toe Amputation at Metatarsophalangeal Joint;  Surgeon: [name], MD;  Location: [facility];  Service: Orthopedics;  Laterality: Left;  . AMPUTATION Right 06/13/2016   Procedure: RIGHT 2nd Toe Amputation;  Surgeon: [name], MD;  Location: [facility];  Service: Orthopedics;  Laterality: Right;  . AMPUTATION Left 10/19/2016   Procedure: Amputation Left 3rd Toe;  Surgeon: [name], MD;  Location: [facility];  Service: Orthopedics;  Laterality: Left;  . TOE AMPUTATION Left 11/16/2015   left great toe   . TOE AMPUTATION Right 06/13/2016  . TONSILLECTOMY     Social History   Occupational History  . Not on file  Tobacco Use  . Smoking status: Never Smoker  . Smokeless tobacco: Never Used  Vaping Use  . Vaping Use: Never used  Substance and Sexual Activity  . Alcohol use: Yes    Alcohol/week: 0.0 standard drinks    Comment: rarely  . Drug use: No  . Sexual activity: Not Currently

## 2020-11-04 DIAGNOSIS — I509 Heart failure, unspecified: Secondary | ICD-10-CM | POA: Diagnosis not present

## 2020-11-04 DIAGNOSIS — R9431 Abnormal electrocardiogram [ECG] [EKG]: Secondary | ICD-10-CM | POA: Diagnosis not present

## 2020-11-07 ENCOUNTER — Ambulatory Visit (INDEPENDENT_AMBULATORY_CARE_PROVIDER_SITE_OTHER): Payer: BC Managed Care – PPO | Admitting: Orthopedic Surgery

## 2020-11-07 ENCOUNTER — Telehealth: Payer: Self-pay

## 2020-11-07 ENCOUNTER — Encounter: Payer: Self-pay | Admitting: Orthopedic Surgery

## 2020-11-07 DIAGNOSIS — L97521 Non-pressure chronic ulcer of other part of left foot limited to breakdown of skin: Secondary | ICD-10-CM

## 2020-11-07 MED ORDER — METOPROLOL TARTRATE 25 MG PO TABS: 12.5000 mg | ORAL_TABLET | Freq: Two times a day (BID) | ORAL | 3 refills | Status: DC

## 2020-11-07 NOTE — Telephone Encounter (Signed)
Called patient reviewed results of heart monitor and MD recommendations.  He is agreeable to start metoprolol 12.5mg  PO BID.  He informed me that he is taking Mg oxide 400mg  OTC.  Pt told to check BP daily, he currently does not have a BP machine but will obtain one.  All questions answered.

## 2020-11-07 NOTE — Telephone Encounter (Signed)
-----   Message from [name], MD sent at 11/06/2020 11:33 AM EDT ----- Results: Short runs of SVT, possibly Atrial Tachycardia Plan: Start metoprolol 12.5 mg PO BID  [name], MD

## 2020-11-07 NOTE — Progress Notes (Signed)
Office Visit Note   Patient: Stephen Whitaker           Date of Birth: [birth year]           MRN: [id number] Visit Date: 11/07/2020              Requested by: [name], NP [address] [place],  [place] [address] PCP: [name], NP  Chief Complaint  Patient presents with  . Left Foot - Follow-up      HPI: Patient is a 65 year old gentleman with multiple medical problems who presents with persistent ulcer beneath the first metatarsal head left foot as well as previous ulcer beneath the Charcot rocker-bottom deformity of the midfoot.  Patient has been using felt relieving donuts and compression stockings.  Patient states that his medical doctor is working on resolving the edema.  Patient is wearing knee-high compression stockings.  Assessment & Plan: Visit Diagnoses:  1. Non-pressure chronic ulcer of other part of left foot limited to breakdown of skin ([place])     Plan: To new felt pads were placed in his shoes to unload pressure from the first metatarsal head.  Follow-Up Instructions: Return in about 2 weeks (around 11/21/2020).   Ortho Exam  Patient is alert, oriented, no adenopathy, well-dressed, normal affect, normal respiratory effort. Examination patient's ulcers are healing well the ulcer beneath the rocker-bottom deformity is completely healed.  The ulcer beneath the first metatarsal head has 100% healthy granulation tissue is 2 cm in diameter 1 mm deep.  Patient does have increased venous swelling in the left leg and he states that his physician is working on resolving his swelling.  He does have heart failure.  Imaging: No results found. No images are attached to the encounter.  Labs: Lab Results  Component Value Date   HGBA1C 5.6 08/10/2014   ESRSEDRATE 27 (H) 07/05/2015   ESRSEDRATE 80 (H) 01/21/2015   CRP 1.8 (H) 07/05/2015   LABURIC 5.9 07/28/2020   LABURIC 5.4 02/19/2018   LABURIC 6.0 08/10/2014   REPTSTATUS 01/22/2015 FINAL 01/21/2015    CULT NO GROWTH Performed at [facility]  01/21/2015     Lab Results  Component Value Date   ALBUMIN 3.9 06/26/2019   ALBUMIN 3.9 10/22/2018   ALBUMIN 4.1 06/23/2018    Lab Results  Component Value Date   MG 1.5 (L) 10/24/2020   MG 1.8 01/21/2015   Lab Results  Component Value Date   VD25OH 32.4 01/23/2017    No results found for: PREALBUMIN CBC EXTENDED Latest Ref Rng & Units 10/06/2020 06/26/2019 10/22/2018  WBC 4.0 - 10.5 K/uL 5.4 2.8(L) 2.5(LL)  RBC 4.22 - 5.81 Mil/uL 4.46 4.14 4.30  HGB 13.0 - 17.0 g/dL 12.0(L) 11.9(L) 11.7(L)  HCT 39.0 - 52.0 % 37.3(L) 36.5(L) 36.7(L)  PLT 150.0 - 400.0 K/uL 338.0 247 219  NEUTROABS 1.4 - 7.7 K/uL 4.6 1.3(L) -  LYMPHSABS 0.7 - 4.0 K/uL 0.5(L) 0.9 -     There is no height or weight on file to calculate BMI.  Orders:  No orders of the defined types were placed in this encounter.  No orders of the defined types were placed in this encounter.    Procedures: No procedures performed  Clinical Data: No additional findings.  ROS:  All other systems negative, except as noted in the HPI. Review of Systems  Objective: Vital Signs: There were no vitals taken for this visit.  Specialty Comments:  No specialty comments available.  [place]  History: Patient Active Problem List   Diagnosis Date Noted  . Heart failure, type unknown ([place]) 10/14/2020  . Nonspecific abnormal electrocardiogram (ECG) (EKG) 10/14/2020  . Atrial tachycardia ([place]) 10/14/2020  . Non-pressure chronic ulcer of other part of left foot limited to breakdown of skin ([place]) 10/12/2020  . Leg swelling 10/06/2020  . DOE (dyspnea on exertion) 10/06/2020  . Thrombocytopenia ([place]) 06/26/2019  . Acquired absence of other right toe(s) ([place]) 02/19/2018  . Boil of buttock 10/24/2016  . Amputated toe of left foot ([place]) 10/19/2016  . Amputated toe, right ([place]) 06/13/2016  . Chronic bronchitis ([place]) 11/24/2015  . Other pancytopenia ([place]) 01/24/2015  . Chronic  pancreatitis ([place]) 01/24/2015  . Blood poisoning   . Neutropenia ([place])   . Iron deficiency anemia due to chronic blood loss   . Leukopenia   . Sepsis ([place]) 01/21/2015  . Leucopenia 01/21/2015  . Anemia 01/21/2015  . Syncope 01/21/2015  . Diarrhea 01/21/2015  . Hypokalemia 01/21/2015  . Loss of consciousness ([place])   . Eczema 10/21/2014  . Essential hypertension 10/21/2014  . Vasovagal syncope 10/21/2014  . Bilateral conjunctivitis 08/10/2014  . Other chronic pancreatitis ([place]) 08/10/2014  . COLD (chronic obstructive lung disease) ([place]) 08/10/2014  . Chronic ethmoidal sinusitis 08/10/2014  . Bronchitis 10/17/2010  . PLANTAR FASCIITIS, LEFT 10/17/2010  . OTHER NEUTROPENIA 04/21/2010  . CALLUS, TOE 03/15/2010  . LEUKOPENIA, MILD 02/20/2010  . LIVER FUNCTION TESTS, ABNORMAL, HX OF 02/16/2010  . DENTAL CARIES 02/15/2010  . ONYCHOMYCOSIS 07/05/2009  . ALLERGIC URTICARIA 07/05/2009  . PAIN IN LIMB 07/05/2009  . ERECTILE DYSFUNCTION 05/25/2008  . INFLUENZA 05/25/2008  . VIRAL INFECTION 10/01/2007  . HSV 05/02/2007  . CONDYLOMA ACUMINATUM 05/02/2007  . HYPERTENSION 05/02/2007  . ALLERGIC RHINITIS 05/02/2007  . Chronic asthma, mild persistent, uncomplicated 27/25/3664  . Gastroesophageal reflux disease without esophagitis 05/02/2007  . PANCREATITIS 05/02/2007   Past Medical History:  Diagnosis Date  . Arthritis   . Boil of buttock   . Chronic bronchitis ([place])   . COPD (chronic obstructive pulmonary disease) (HCC)    Chronic Bronchitis.Advair inhaler   . Dyspnea   . GERD (gastroesophageal reflux disease)    takes Protonix daily  . Gout    takes Allopurinol daily  . Hemorrhoids   . Hypertension    takes HCTZ daily  . Pancreatitis    10 years ago  . Pneumonia 2016  . Tubular adenoma of colon     Family History  Adopted: Yes  Problem Relation Age of Onset  . Cancer Sister 75       unknown origin     Past Surgical History:  Procedure Laterality Date  . AMPUTATION  Left 07/14/2014   Procedure: 2nd Toe Amputation;  Surgeon: [name], MD;  Location: [facility];  Service: Orthopedics;  Laterality: Left;  . AMPUTATION Left 11/16/2015   Procedure: Left Great Toe Amputation at Metatarsophalangeal Joint;  Surgeon: [name], MD;  Location: [facility];  Service: Orthopedics;  Laterality: Left;  . AMPUTATION Right 06/13/2016   Procedure: RIGHT 2nd Toe Amputation;  Surgeon: [name], MD;  Location: [facility];  Service: Orthopedics;  Laterality: Right;  . AMPUTATION Left 10/19/2016   Procedure: Amputation Left 3rd Toe;  Surgeon: [name], MD;  Location: [facility];  Service: Orthopedics;  Laterality: Left;  . TOE AMPUTATION Left 11/16/2015   left great toe   . TOE AMPUTATION Right 06/13/2016  . TONSILLECTOMY     Social History  Occupational History  . Not on file  Tobacco Use  . Smoking status: Never Smoker  . Smokeless tobacco: Never Used  Vaping Use  . Vaping Use: Never used  Substance and Sexual Activity  . Alcohol use: Yes    Alcohol/week: 0.0 standard drinks    Comment: rarely  . Drug use: No  . Sexual activity: Not Currently

## 2020-11-08 ENCOUNTER — Other Ambulatory Visit (HOSPITAL_COMMUNITY): Payer: BC Managed Care – PPO

## 2020-11-08 ENCOUNTER — Telehealth: Payer: Self-pay

## 2020-11-08 MED ORDER — FUROSEMIDE 40 MG PO TABS: 60.0000 mg | ORAL_TABLET | Freq: Every day | ORAL | 3 refills | Status: DC

## 2020-11-08 NOTE — Telephone Encounter (Signed)
Called pt d/t complaints voiced at front desk.  He c/o blurry vision for 2 days that comes and goes.  He also c/o left arm weakness with left hand cramping that comes and goes and has occurred for months.  He reports his last BP was 138 couldn't recall DBP number.  He reports that he has been taking furosemide 60mg  PO BID.  (I will update med list- ok per Dr. Gasper Sells).  Told pt that if he has a severe HA with left arm weakness and blurry vision to call 911.  Gave him Dr. Gasper Sells recommendation that he see his PCP or neurologist for left arm weakness.  He c/o of continued DOE, I let him know that we could treat this better once his ECHO has resulted. He notified me that his ECHO for today was rescheduled because he was late.  I sent scheduler L. Judeth Horn a message to see if we could get him in before 12/07/20.  I told him to arrive 15-20 mintues early for the next ECHO appointment. He is agreeable to plan.

## 2020-11-10 ENCOUNTER — Other Ambulatory Visit: Payer: Self-pay | Admitting: Family Medicine

## 2020-11-10 ENCOUNTER — Other Ambulatory Visit (HOSPITAL_COMMUNITY): Payer: BC Managed Care – PPO

## 2020-11-10 DIAGNOSIS — I10 Essential (primary) hypertension: Secondary | ICD-10-CM

## 2020-11-13 ENCOUNTER — Other Ambulatory Visit: Payer: Self-pay | Admitting: Internal Medicine

## 2020-11-21 ENCOUNTER — Other Ambulatory Visit: Payer: Self-pay

## 2020-11-21 ENCOUNTER — Encounter: Payer: Self-pay | Admitting: Physician Assistant

## 2020-11-21 ENCOUNTER — Ambulatory Visit (INDEPENDENT_AMBULATORY_CARE_PROVIDER_SITE_OTHER): Payer: BC Managed Care – PPO | Admitting: Physician Assistant

## 2020-11-21 ENCOUNTER — Encounter: Payer: Self-pay | Admitting: Orthopedic Surgery

## 2020-11-21 DIAGNOSIS — M2550 Pain in unspecified joint: Secondary | ICD-10-CM | POA: Diagnosis not present

## 2020-11-21 DIAGNOSIS — L97521 Non-pressure chronic ulcer of other part of left foot limited to breakdown of skin: Secondary | ICD-10-CM | POA: Diagnosis not present

## 2020-11-21 MED ORDER — MELOXICAM 7.5 MG PO TABS: 7.5000 mg | ORAL_TABLET | Freq: Every day | ORAL | 0 refills | Status: DC

## 2020-11-21 NOTE — Progress Notes (Signed)
Office Visit Note   Patient: Stephen Whitaker           Date of Birth: Feb 09, 1956           MRN: [id number] Visit Date: 11/21/2020              Requested by: [name], NP [address] [place],  [place] [address] PCP: [name], NP  Chief Complaint  Patient presents with  . Left Hip - Follow-up      HPI: Patient presents in follow-up today for his left foot ulcers.  He has a rocker-bottom Charcot deformity.  He has a area of callusing and pressure over the medial side of his foot.  Also on the plantar surface of his first MTP.  He thinks things are slightly better.  Assessment & Plan: Visit Diagnoses:  1. Arthralgia of multiple joints     Plan: Continue offloading is much as possible.  Follow-up in 2 weeks.  Follow-Up Instructions: No follow-ups on file.   Ortho Exam  Patient is alert, oriented, no adenopathy, well-dressed, normal affect, normal respiratory effort. Left foot forefoot ulcer measures about a centimeter in diameter with good healthy granulation tissue at the base.  I did debride some of the callus and macerated skin and touched it with silver nitrate stick.  There is no surrounding cellulitis.  No foul odor.  Medial side of foot is mostly a callus without any drainage.  I did pare this down to a soft surface.  No surrounding cellulitis no ascending cellulitis or signs of infection at this time  Imaging: No results found. No images are attached to the encounter.  Labs: Lab Results  Component Value Date   HGBA1C 5.6 08/10/2014   ESRSEDRATE 27 (H) 07/05/2015   ESRSEDRATE 80 (H) 01/21/2015   CRP 1.8 (H) 07/05/2015   LABURIC 5.9 07/28/2020   LABURIC 5.4 02/19/2018   LABURIC 6.0 08/10/2014   REPTSTATUS 01/22/2015 FINAL 01/21/2015   CULT NO GROWTH Performed at [facility]  01/21/2015     Lab Results  Component Value Date   ALBUMIN 3.9 06/26/2019   ALBUMIN 3.9 10/22/2018   ALBUMIN 4.1 06/23/2018    Lab Results   Component Value Date   MG 1.5 (L) 10/24/2020   MG 1.8 01/21/2015   Lab Results  Component Value Date   VD25OH 32.4 01/23/2017    No results found for: PREALBUMIN CBC EXTENDED Latest Ref Rng & Units 10/06/2020 06/26/2019 10/22/2018  WBC 4.0 - 10.5 K/uL 5.4 2.8(L) 2.5(LL)  RBC 4.22 - 5.81 Mil/uL 4.46 4.14 4.30  HGB 13.0 - 17.0 g/dL 12.0(L) 11.9(L) 11.7(L)  HCT 39.0 - 52.0 % 37.3(L) 36.5(L) 36.7(L)  PLT 150.0 - 400.0 K/uL 338.0 247 219  NEUTROABS 1.4 - 7.7 K/uL 4.6 1.3(L) -  LYMPHSABS 0.7 - 4.0 K/uL 0.5(L) 0.9 -     There is no height or weight on file to calculate BMI.  Orders:  No orders of the defined types were placed in this encounter.  Meds ordered this encounter  Medications  . meloxicam (MOBIC) 7.5 MG tablet    Sig: Take 1 tablet (7.5 mg total) by mouth daily.    Dispense:  30 tablet    Refill:  0     Procedures: No procedures performed  Clinical Data: No additional findings.  ROS:  All other systems negative, except as noted in the HPI. Review of Systems  Objective: Vital Signs: There were no vitals taken for this  visit.  Specialty Comments:  No specialty comments available.  PMFS History: Patient Active Problem List   Diagnosis Date Noted  . Heart failure, type unknown ([place]) 10/14/2020  . Nonspecific abnormal electrocardiogram (ECG) (EKG) 10/14/2020  . Atrial tachycardia ([place]) 10/14/2020  . Non-pressure chronic ulcer of other part of left foot limited to breakdown of skin ([place]) 10/12/2020  . Leg swelling 10/06/2020  . DOE (dyspnea on exertion) 10/06/2020  . Thrombocytopenia ([place]) 06/26/2019  . Acquired absence of other right toe(s) ([place]) 02/19/2018  . Boil of buttock 10/24/2016  . Amputated toe of left foot ([place]) 10/19/2016  . Amputated toe, right ([place]) 06/13/2016  . Chronic bronchitis ([place]) 11/24/2015  . Other pancytopenia ([place]) 01/24/2015  . Chronic pancreatitis ([place]) 01/24/2015  . Blood poisoning   . Neutropenia ([place])   . Iron deficiency  anemia due to chronic blood loss   . Leukopenia   . Sepsis ([place]) 01/21/2015  . Leucopenia 01/21/2015  . Anemia 01/21/2015  . Syncope 01/21/2015  . Diarrhea 01/21/2015  . Hypokalemia 01/21/2015  . Loss of consciousness ([place])   . Eczema 10/21/2014  . Essential hypertension 10/21/2014  . Vasovagal syncope 10/21/2014  . Bilateral conjunctivitis 08/10/2014  . Other chronic pancreatitis ([place]) 08/10/2014  . COLD (chronic obstructive lung disease) ([place]) 08/10/2014  . Chronic ethmoidal sinusitis 08/10/2014  . Bronchitis 10/17/2010  . PLANTAR FASCIITIS, LEFT 10/17/2010  . OTHER NEUTROPENIA 04/21/2010  . CALLUS, TOE 03/15/2010  . LEUKOPENIA, MILD 02/20/2010  . LIVER FUNCTION TESTS, ABNORMAL, HX OF 02/16/2010  . DENTAL CARIES 02/15/2010  . ONYCHOMYCOSIS 07/05/2009  . ALLERGIC URTICARIA 07/05/2009  . PAIN IN LIMB 07/05/2009  . ERECTILE DYSFUNCTION 05/25/2008  . INFLUENZA 05/25/2008  . VIRAL INFECTION 10/01/2007  . HSV 05/02/2007  . CONDYLOMA ACUMINATUM 05/02/2007  . HYPERTENSION 05/02/2007  . ALLERGIC RHINITIS 05/02/2007  . Chronic asthma, mild persistent, uncomplicated 66/44/0347  . Gastroesophageal reflux disease without esophagitis 05/02/2007  . PANCREATITIS 05/02/2007   Past Medical History:  Diagnosis Date  . Abnormal liver function tests   . Arthritis   . Boil of buttock   . COPD (chronic obstructive pulmonary disease) (HCC)    Chronic Bronchitis.Advair inhaler   . Dyspnea   . GERD (gastroesophageal reflux disease)    takes Protonix daily  . Gout    takes Allopurinol daily  . Hemorrhoids   . Hypertension    takes HCTZ daily  . Hypomagnesemia   . Leg ulcer ([place])   . Mild mitral regurgitation   . Pancreatitis    10 years ago  . Pneumonia 2016  . Tricuspid regurgitation   . Tubular adenoma of colon     Family History  Adopted: Yes  Problem Relation Age of Onset  . Cancer Sister 11       unknown origin     Past Surgical History:  Procedure Laterality Date  .  AMPUTATION Left 07/14/2014   Procedure: 2nd Toe Amputation;  Surgeon: [name], MD;  Location: [facility];  Service: Orthopedics;  Laterality: Left;  . AMPUTATION Left 11/16/2015   Procedure: Left Great Toe Amputation at Metatarsophalangeal Joint;  Surgeon: [name], MD;  Location: [facility];  Service: Orthopedics;  Laterality: Left;  . AMPUTATION Right 06/13/2016   Procedure: RIGHT 2nd Toe Amputation;  Surgeon: [name], MD;  Location: [facility];  Service: Orthopedics;  Laterality: Right;  . AMPUTATION Left 10/19/2016   Procedure: Amputation Left 3rd Toe;  Surgeon: [name], MD;  Location: [facility];  Service: Orthopedics;  Laterality: Left;  . TOE AMPUTATION Left 11/16/2015   left great toe   . TOE AMPUTATION Right 06/13/2016  . TONSILLECTOMY     Social History   Occupational History  . Not on file  Tobacco Use  . Smoking status: Never Smoker  . Smokeless tobacco: Never Used  Vaping Use  . Vaping Use: Never used  Substance and Sexual Activity  . Alcohol use: Yes    Alcohol/week: 0.0 standard drinks    Comment: rarely  . Drug use: No  . Sexual activity: Not Currently

## 2020-11-21 NOTE — Progress Notes (Deleted)
Cardiology Office Note    Date:  11/21/2020   ID:  [name], [name] [birth year], MRN [id number]  PCP:  Stephen Pounds, NP  Cardiologist:  [name], MD  Electrophysiologist:  None   Chief Complaint: ***  History of Present Illness:   [facility] is a 65 y.o. male with history of HTN, COPD, GERD, moderate TR, gout who presents for follow-up. Remote echo 2016 shoewd EF 55-60%, grade 1 DD, mild MR, moderate TR. He recently saw Dr. Gasper [facility] to establish care in 09/2020 reporting increasing SOB, leg swelling, weight gain, and abdominal swelling. He had been started on Lasix with some improvement. Also had NM V/Q scan at [facility] that was low probability for PE. Persistent tachycardia was noted, possibly atrial tach at that visit. Event monitor showed minimum HR 90, max 184, average HR 107, 23 runs SVT felt atrial tach, isolated PACs/PVCs (diary events correlated with PACs). He was started on low dose metoprolol.  2d echocardiogram was ordered, still pending. Lasix was increased and prescribed MagOx for low Mg 1.5. Last Cr 1.31. Also noted to have prior abnormal LFTs in 2020.   Heart failure, etiology unknown Atrial tachycardia/sinus tachycardia Abnormal LFTs, creatinine Left leg ulcer on foot Hypomagnesemia  Pad assessment Needs CMET  Labwork independently reviewed: 10/24/20 Mg 1.5, pBNP 7418, K 4.0, Cr 1.31, calcium 8.3 09/2020 Hgb 12.0, d-dimer 1.52, TSH wnl 05/2019 abnormal LFTs   Past Medical History:  Diagnosis Date  . Arthritis   . Boil of buttock   . Chronic bronchitis ([place])   . COPD (chronic obstructive pulmonary disease) (HCC)    Chronic Bronchitis.Advair inhaler   . Dyspnea   . GERD (gastroesophageal reflux disease)    takes Protonix daily  . Gout    takes Allopurinol daily  . Hemorrhoids   . Hypertension    takes HCTZ daily  . Pancreatitis    10 years ago  . Pneumonia 2016  . Tubular adenoma of colon     Past Surgical History:   Procedure Laterality Date  . AMPUTATION Left 07/14/2014   Procedure: 2nd Toe Amputation;  Surgeon: [name], MD;  Location: [place];  Service: Orthopedics;  Laterality: Left;  . AMPUTATION Left 11/16/2015   Procedure: Left Great Toe Amputation at Metatarsophalangeal Joint;  Surgeon: [name], MD;  Location: [place];  Service: Orthopedics;  Laterality: Left;  . AMPUTATION Right 06/13/2016   Procedure: RIGHT 2nd Toe Amputation;  Surgeon: [name], MD;  Location: [facility];  Service: Orthopedics;  Laterality: Right;  . AMPUTATION Left 10/19/2016   Procedure: Amputation Left 3rd Toe;  Surgeon: [name], MD;  Location: [place];  Service: Orthopedics;  Laterality: Left;  . TOE AMPUTATION Left 11/16/2015   left great toe   . TOE AMPUTATION Right 06/13/2016  . TONSILLECTOMY      Current Medications: No outpatient medications have been marked as taking for the 11/22/20 encounter (Appointment) with Stephen Pitter, PA-C.   ***   Allergies:   Ciprofloxacin, Shellfish allergy, and Sulfa antibiotics   Social History   Socioeconomic History  . Marital status: Single    Spouse name: Not on file  . Number of children: 1  . Years of education:    . Highest education level: Not on file  Occupational History  . Not on file  Tobacco Use  . Smoking status: Never Smoker  . Smokeless tobacco: Never Used  Vaping Use  . Vaping Use: Never  used  Substance and Sexual Activity  . Alcohol use: Yes    Alcohol/week: 0.0 standard drinks    Comment: rarely  . Drug use: No  . Sexual activity: Not Currently  Other Topics Concern  . Not on file  Social History Narrative  . Not on file   Social Determinants of Health   Financial Resource Strain: Not on file  Food Insecurity: Not on file  Transportation Needs: Not on file  Physical Activity: Not on file  Stress: Not on file  Social Connections: Not on file     Family History:  The patient's ***family history includes Cancer (age of onset:  66) in his sister. He was adopted.  ROS:   Please see the history of present illness. Otherwise, review of systems is positive for ***.  All other systems are reviewed and otherwise negative.    EKGs/Labs/Other Studies Reviewed:    Studies reviewed are outlined and summarized above. Reports included below if pertinent.  2D echo 2016  - Left ventricle: The cavity size was normal. Wall thickness was  normal. Systolic function was normal. The estimated ejection  fraction was in the range of 55% to 60%. Wall motion was normal;  there were no regional wall motion abnormalities. Doppler  parameters are consistent with abnormal left ventricular  relaxation (grade 1 diastolic dysfunction).  - Mitral valve: There was mild regurgitation.  - Right atrium: The atrium was mildly dilated.  - Tricuspid valve: There was moderate regurgitation.  - Pulmonary arteries: PA peak pressure: 34 mm Hg (S).      EKG:  EKG is ordered today, personally reviewed, demonstrating ***  Recent Labs: 10/06/2020: Hemoglobin 12.0; Platelets 338.0; TSH 2.87 10/24/2020: BUN 21; Creatinine, Ser 1.31; Magnesium 1.5; NT-Pro BNP 7,418; Potassium 4.0; Sodium 138  Recent Lipid Panel    Component Value Date/Time   CHOL 155 06/26/2019 1035   TRIG 121 06/26/2019 1035   HDL 39 (L) 06/26/2019 1035   CHOLHDL 4.0 06/26/2019 1035   CHOLHDL 3.3 08/10/2014 1023   VLDL 15 08/10/2014 1023   LDLCALC 94 06/26/2019 1035    PHYSICAL EXAM:    VS:  There were no vitals taken for this visit.  BMI: There is no height or weight on file to calculate BMI.  GEN: Well nourished, well developed male in no acute distress HEENT: normocephalic, atraumatic Neck: no JVD, carotid bruits, or masses Cardiac: ***RRR; no murmurs, rubs, or gallops, no edema  Respiratory:  clear to auscultation bilaterally, normal work of breathing GI: soft, nontender, nondistended, + BS MS: no deformity or atrophy Skin: warm and dry, no rash Neuro:   Alert and Oriented x 3, Strength and sensation are intact, follows commands Psych: euthymic mood, full affect  Wt Readings from Last 3 Encounters:  10/31/20 215 lb (97.5 kg)  10/14/20 215 lb 3.2 oz (97.6 kg)  10/12/20 214 lb (97.1 kg)     ASSESSMENT & PLAN:   1. ***  Disposition: F/u with ***   Medication Adjustments/Labs and Tests Ordered: Current medicines are reviewed at length with the patient today.  Concerns regarding medicines are outlined above. Medication changes, Labs and Tests ordered today are summarized above and listed in the Patient Instructions accessible in Encounters.   Signed, Stephen Pitter, PA-C  11/21/2020 12:54 PM    [facility] [place], [place], [place]  [address] Phone: [phone]; Fax: [phone]

## 2020-11-22 ENCOUNTER — Ambulatory Visit: Payer: BC Managed Care – PPO | Admitting: Physician Assistant

## 2020-11-22 DIAGNOSIS — I471 Supraventricular tachycardia: Secondary | ICD-10-CM

## 2020-11-22 DIAGNOSIS — I509 Heart failure, unspecified: Secondary | ICD-10-CM

## 2020-11-22 DIAGNOSIS — R000 Tachycardia, unspecified: Secondary | ICD-10-CM

## 2020-11-22 DIAGNOSIS — L97929 Non-pressure chronic ulcer of unspecified part of left lower leg with unspecified severity: Secondary | ICD-10-CM

## 2020-11-22 DIAGNOSIS — R945 Abnormal results of liver function studies: Secondary | ICD-10-CM

## 2020-12-05 ENCOUNTER — Ambulatory Visit (INDEPENDENT_AMBULATORY_CARE_PROVIDER_SITE_OTHER): Payer: BC Managed Care – PPO | Admitting: Orthopedic Surgery

## 2020-12-05 DIAGNOSIS — Z89422 Acquired absence of other left toe(s): Secondary | ICD-10-CM | POA: Diagnosis not present

## 2020-12-05 DIAGNOSIS — L97521 Non-pressure chronic ulcer of other part of left foot limited to breakdown of skin: Secondary | ICD-10-CM

## 2020-12-05 DIAGNOSIS — S98132A Complete traumatic amputation of one left lesser toe, initial encounter: Secondary | ICD-10-CM

## 2020-12-06 ENCOUNTER — Encounter: Payer: Self-pay | Admitting: Orthopedic Surgery

## 2020-12-06 NOTE — Progress Notes (Signed)
Office Visit Note   Patient: [facility]           Date of Birth: 1956/07/09           MRN: [id number] Visit Date: 12/05/2020              Requested by: [name], NP [address] [place],  [place] [address] PCP: [name], NP  Chief Complaint  Patient presents with  . Left Foot - Pain      HPI: Patient is a 65 year old gentleman who presents in follow-up for insensate neuropathic ulceration left foot as well as venous insufficiency.  Assessment & Plan: Visit Diagnoses:  1. Non-pressure chronic ulcer of other part of left foot limited to breakdown of skin ([place])   2. Amputated toe of left foot ([place])     Plan: Patient's Charcot collapse of the left foot is stable plan for new felt relieving donuts placed to unload the pressure points.  Patient was given a new pair of socks.  Follow-Up Instructions: Return in about 4 weeks (around 01/02/2021).   Ortho Exam  Patient is alert, oriented, no adenopathy, well-dressed, normal affect, normal respiratory effort. Examination patient has venous stasis swelling in both legs but no open ulcers.  He has a Hydrographic surveyor grade 1 ulcer with good healthy granulation tissue 10 mm in diameter 1 mm deep beneath the first metatarsal head there is also a very small 5 mm ulcer beneath the Charcot rocker-bottom deformity of the midfoot.  No cellulitis no drainage no exposed bone or tendon.  New felt relieving donuts were fabricated for his shoes and patient was given a pair of compression socks.  Imaging: No results found. No images are attached to the encounter.  Labs: Lab Results  Component Value Date   HGBA1C 5.6 08/10/2014   ESRSEDRATE 27 (H) 07/05/2015   ESRSEDRATE 80 (H) 01/21/2015   CRP 1.8 (H) 07/05/2015   LABURIC 5.9 07/28/2020   LABURIC 5.4 02/19/2018   LABURIC 6.0 08/10/2014   REPTSTATUS 01/22/2015 FINAL 01/21/2015   CULT NO GROWTH Performed at [facility]  01/21/2015     Lab Results  Component Value  Date   ALBUMIN 3.9 06/26/2019   ALBUMIN 3.9 10/22/2018   ALBUMIN 4.1 06/23/2018    Lab Results  Component Value Date   MG 1.5 (L) 10/24/2020   MG 1.8 01/21/2015   Lab Results  Component Value Date   VD25OH 32.4 01/23/2017    No results found for: PREALBUMIN CBC EXTENDED Latest Ref Rng & Units 10/06/2020 06/26/2019 10/22/2018  WBC 4.0 - 10.5 K/uL 5.4 2.8(L) 2.5(LL)  RBC 4.22 - 5.81 Mil/uL 4.46 4.14 4.30  HGB 13.0 - 17.0 g/dL 12.0(L) 11.9(L) 11.7(L)  HCT 39.0 - 52.0 % 37.3(L) 36.5(L) 36.7(L)  PLT 150.0 - 400.0 K/uL 338.0 247 219  NEUTROABS 1.4 - 7.7 K/uL 4.6 1.3(L) -  LYMPHSABS 0.7 - 4.0 K/uL 0.5(L) 0.9 -     There is no height or weight on file to calculate BMI.  Orders:  No orders of the defined types were placed in this encounter.  No orders of the defined types were placed in this encounter.    Procedures: No procedures performed  Clinical Data: No additional findings.  ROS:  All other systems negative, except as noted in the HPI. Review of Systems  Objective: Vital Signs: There were no vitals taken for this visit.  Specialty Comments:  No specialty comments available.  PMFS History: Patient Active Problem  List   Diagnosis Date Noted  . Heart failure, type unknown ([place]) 10/14/2020  . Nonspecific abnormal electrocardiogram (ECG) (EKG) 10/14/2020  . Atrial tachycardia ([place]) 10/14/2020  . Non-pressure chronic ulcer of other part of left foot limited to breakdown of skin ([place]) 10/12/2020  . Leg swelling 10/06/2020  . DOE (dyspnea on exertion) 10/06/2020  . Thrombocytopenia ([place]) 06/26/2019  . Acquired absence of other right toe(s) ([place]) 02/19/2018  . Boil of buttock 10/24/2016  . Amputated toe of left foot ([place]) 10/19/2016  . Amputated toe, right ([place]) 06/13/2016  . Chronic bronchitis ([place]) 11/24/2015  . Other pancytopenia ([place]) 01/24/2015  . Chronic pancreatitis ([place]) 01/24/2015  . Blood poisoning   . Neutropenia ([place])   . Iron deficiency anemia due  to chronic blood loss   . Leukopenia   . Sepsis ([place]) 01/21/2015  . Leucopenia 01/21/2015  . Anemia 01/21/2015  . Syncope 01/21/2015  . Diarrhea 01/21/2015  . Hypokalemia 01/21/2015  . Loss of consciousness ([place])   . Eczema 10/21/2014  . Essential hypertension 10/21/2014  . Vasovagal syncope 10/21/2014  . Bilateral conjunctivitis 08/10/2014  . Other chronic pancreatitis ([place]) 08/10/2014  . COLD (chronic obstructive lung disease) ([place]) 08/10/2014  . Chronic ethmoidal sinusitis 08/10/2014  . Bronchitis 10/17/2010  . PLANTAR FASCIITIS, LEFT 10/17/2010  . OTHER NEUTROPENIA 04/21/2010  . CALLUS, TOE 03/15/2010  . LEUKOPENIA, MILD 02/20/2010  . LIVER FUNCTION TESTS, ABNORMAL, HX OF 02/16/2010  . DENTAL CARIES 02/15/2010  . ONYCHOMYCOSIS 07/05/2009  . ALLERGIC URTICARIA 07/05/2009  . PAIN IN LIMB 07/05/2009  . ERECTILE DYSFUNCTION 05/25/2008  . INFLUENZA 05/25/2008  . VIRAL INFECTION 10/01/2007  . HSV 05/02/2007  . CONDYLOMA ACUMINATUM 05/02/2007  . HYPERTENSION 05/02/2007  . ALLERGIC RHINITIS 05/02/2007  . Chronic asthma, mild persistent, uncomplicated 96/78/9381  . Gastroesophageal reflux disease without esophagitis 05/02/2007  . PANCREATITIS 05/02/2007   Past Medical History:  Diagnosis Date  . Abnormal liver function tests   . Arthritis   . Boil of buttock   . COPD (chronic obstructive pulmonary disease) (HCC)    Chronic Bronchitis.Advair inhaler   . Dyspnea   . GERD (gastroesophageal reflux disease)    takes Protonix daily  . Gout    takes Allopurinol daily  . Hemorrhoids   . Hypertension    takes HCTZ daily  . Hypomagnesemia   . Leg ulcer ([place])   . Mild mitral regurgitation   . Pancreatitis    10 years ago  . Pneumonia 2016  . Tricuspid regurgitation   . Tubular adenoma of colon     Family History  Adopted: Yes  Problem Relation Age of Onset  . Cancer Sister 72       unknown origin     Past Surgical History:  Procedure Laterality Date  . AMPUTATION  Left 07/14/2014   Procedure: 2nd Toe Amputation;  Surgeon: [name], MD;  Location: [place];  Service: Orthopedics;  Laterality: Left;  . AMPUTATION Left 11/16/2015   Procedure: Left Great Toe Amputation at Metatarsophalangeal Joint;  Surgeon: [name], MD;  Location: [place];  Service: Orthopedics;  Laterality: Left;  . AMPUTATION Right 06/13/2016   Procedure: RIGHT 2nd Toe Amputation;  Surgeon: [name], MD;  Location: [place];  Service: Orthopedics;  Laterality: Right;  . AMPUTATION Left 10/19/2016   Procedure: Amputation Left 3rd Toe;  Surgeon: [name], MD;  Location: [facility];  Service: Orthopedics;  Laterality: Left;  . TOE AMPUTATION Left 11/16/2015   left great  toe   . TOE AMPUTATION Right 06/13/2016  . TONSILLECTOMY     Social History   Occupational History  . Not on file  Tobacco Use  . Smoking status: Never Smoker  . Smokeless tobacco: Never Used  Vaping Use  . Vaping Use: Never used  Substance and Sexual Activity  . Alcohol use: Yes    Alcohol/week: 0.0 standard drinks    Comment: rarely  . Drug use: No  . Sexual activity: Not Currently

## 2020-12-07 ENCOUNTER — Other Ambulatory Visit (HOSPITAL_COMMUNITY): Payer: BC Managed Care – PPO

## 2020-12-08 ENCOUNTER — Ambulatory Visit (INDEPENDENT_AMBULATORY_CARE_PROVIDER_SITE_OTHER): Payer: BC Managed Care – PPO | Admitting: Internal Medicine

## 2020-12-08 ENCOUNTER — Ambulatory Visit (HOSPITAL_COMMUNITY): Payer: BC Managed Care – PPO | Attending: Cardiology

## 2020-12-08 ENCOUNTER — Encounter (HOSPITAL_COMMUNITY): Payer: Self-pay

## 2020-12-08 ENCOUNTER — Encounter: Payer: Self-pay | Admitting: Internal Medicine

## 2020-12-08 ENCOUNTER — Other Ambulatory Visit: Payer: Self-pay

## 2020-12-08 VITALS — BP 138/90 | HR 106 | Ht 74.5 in | Wt 212.6 lb

## 2020-12-08 DIAGNOSIS — I509 Heart failure, unspecified: Secondary | ICD-10-CM | POA: Diagnosis not present

## 2020-12-08 DIAGNOSIS — I5023 Acute on chronic systolic (congestive) heart failure: Secondary | ICD-10-CM | POA: Diagnosis not present

## 2020-12-08 DIAGNOSIS — I471 Supraventricular tachycardia: Secondary | ICD-10-CM

## 2020-12-08 LAB — ECHOCARDIOGRAM COMPLETE
MV M vel: 4.76 m/s
MV Peak grad: 90.6 mmHg
Radius: 0.5 cm
S' Lateral: 5 cm

## 2020-12-08 NOTE — Progress Notes (Signed)
Cardiology Office Note:    Date:  12/08/2020   ID:  Jazon, Jipson 1955-09-26, MRN [id number]  PCP:  [name], NP   [place]  Cardiologist:  Rudean Haskell MD Advanced Practice Provider:  No care team member to display Electrophysiologist:  None       CC: Referral after LE swelling Consulted for the evaluation of CHF at the behest of Dr. Melvyn Novas  History of Present Illness:    Stephen Whitaker is a 65 y.o. male with a hx of HTN, COPD, GERD, moderate TR, Gout who presents for evaluation 10/14/20.  In interim has had new runs of SVT and started on low dose BB.  Add on for 12/08/20.  Patient notes that he is doing fine.  No chest pain or pressure.  No SOB, but some DOE at work and no PND/Orthopnea.  No weight gain and note improvement in leg swelling.  No palpitations or syncope.  Patient notes he has forgotten some of his increase lasix dosing and not been taking it as expected.  Since last visit notes has runs of SVT on heart monitor and started low dose BB.  Relevant interval testing or therapy include echocardiogram done today:  Sonographer called me for review:  EF < 20%, Moderate TR, moderate MR, RV enlargement; Bi-atrial severe enlargement.  Small pericardial effusion without tamponade.  No LV thrombus.  Significant changes from prior.   Past Medical History:  Diagnosis Date  . Abnormal liver function tests   . Arthritis   . Boil of buttock   . COPD (chronic obstructive pulmonary disease) (HCC)    Chronic Bronchitis.Advair inhaler   . Dyspnea   . GERD (gastroesophageal reflux disease)    takes Protonix daily  . Gout    takes Allopurinol daily  . Hemorrhoids   . Hypertension    takes HCTZ daily  . Hypomagnesemia   . Leg ulcer ([place])   . Mild mitral regurgitation   . Pancreatitis    10 years ago  . Pneumonia 2016  . Tricuspid regurgitation   . Tubular adenoma of colon     Past Surgical History:  Procedure Laterality  Date  . AMPUTATION Left 07/14/2014   Procedure: 2nd Toe Amputation;  Surgeon: [name], MD;  Location: [facility];  Service: Orthopedics;  Laterality: Left;  . AMPUTATION Left 11/16/2015   Procedure: Left Great Toe Amputation at Metatarsophalangeal Joint;  Surgeon: [name], MD;  Location: [facility];  Service: Orthopedics;  Laterality: Left;  . AMPUTATION Right 06/13/2016   Procedure: RIGHT 2nd Toe Amputation;  Surgeon: [name], MD;  Location: [facility];  Service: Orthopedics;  Laterality: Right;  . AMPUTATION Left 10/19/2016   Procedure: Amputation Left 3rd Toe;  Surgeon: [name], MD;  Location: [facility];  Service: Orthopedics;  Laterality: Left;  . TOE AMPUTATION Left 11/16/2015   left great toe   . TOE AMPUTATION Right 06/13/2016  . TONSILLECTOMY      Current Medications: Current Meds  Medication Sig  . albuterol (VENTOLIN HFA) 108 (90 Base) MCG/ACT inhaler Inhale 2 puffs into the lungs every 4 (four) hours as needed for wheezing or shortness of breath.  . allopurinol (ZYLOPRIM) 300 MG tablet Take 1 tablet (300 mg total) by mouth daily.  Marland Kitchen amLODipine (NORVASC) 10 MG tablet TAKE 1 TABLET BY MOUTH ONCE DAILY **  MUST  KEEP  UPCOMING  OFFICE  VISIT  FOR  REFILLS**  . Bioflavonoid  Products (ESTER-C) TABS Take 1 tablet by mouth daily.  . Blood Pressure Monitoring DEVI 1 Units by Does not apply route daily. Measure blood pressure once a day  . budesonide-formoterol (SYMBICORT) 160-4.5 MCG/ACT inhaler USE 2 INHALATIONS TWICE A DAY  . Colchicine 0.6 MG CAPS Take 1 capsule by mouth daily for 6 doses. ONLY AS NEEDED FOR GOUT FLARE  . fluticasone (FLONASE) 50 MCG/ACT nasal spray Place 1 spray into both nostrils 2 (two) times daily.  . furosemide (LASIX) 40 MG tablet Take 1.5 tablets (60 mg total) by mouth daily.  Marland Kitchen loratadine (CLARITIN) 10 MG tablet Take 10 mg by mouth daily.  . Magnesium Oxide 400 MG CAPS Take 1 capsule (400 mg total) by mouth daily.  . meloxicam (MOBIC) 7.5 MG tablet Take  1 tablet (7.5 mg total) by mouth daily.  . metoprolol tartrate (LOPRESSOR) 25 MG tablet Take 0.5 tablets (12.5 mg total) by mouth 2 (two) times daily.  . montelukast (SINGULAIR) 10 MG tablet TAKE 1 TABLET AT BEDTIME  . pantoprazole (PROTONIX) 40 MG tablet Take 80 mg by mouth 2 (two) times daily. Take 1 tab AM, 1 tab pm  . SPIRULINA PO Take 1 capsule by mouth 3 (three) times daily.  . [DISCONTINUED] pantoprazole (PROTONIX) 40 MG tablet TAKE 1 TABLET BY MOUTH 30 TO 60 MINUTES BEFORE YOUR FIRST AND LAST MEALS OF THE DAY (Patient taking differently: Take 80 mg by mouth 2 (two) times daily. TAKE 1 TABLET BY MOUTH  AM, 1 Tab PM)     Allergies:   Ciprofloxacin, Shellfish allergy, and Sulfa antibiotics   Social History   Socioeconomic History  . Marital status: Single    Spouse name: Not on file  . Number of children: 1  . Years of education:    . Highest education level: Not on file  Occupational History  . Not on file  Tobacco Use  . Smoking status: Never Smoker  . Smokeless tobacco: Never Used  Vaping Use  . Vaping Use: Never used  Substance and Sexual Activity  . Alcohol use: Yes    Alcohol/week: 0.0 standard drinks    Comment: rarely  . Drug use: No  . Sexual activity: Not Currently  Other Topics Concern  . Not on file  Social History Narrative  . Not on file   Social Determinants of Health   Financial Resource Strain: Not on file  Food Insecurity: Not on file  Transportation Needs: Not on file  Physical Activity: Not on file  Stress: Not on file  Social Connections: Not on file    Social: Works at a [employer]  Family History: The patient's family history includes Cancer (age of onset: 1) in his sister. He was adopted.  ROS:   Please see the history of present illness.     All other systems reviewed and are negative.  EKGs/Labs/Other Studies Reviewed:    The following studies were reviewed today: EKG:   12/08/20:  Sinus tachycardia Diffuse TWI 10/14/20: Cannot  exclude Atrial Tachycardia  Notable LVH; consider Echo Contrast with study 120/25/2017: SR rate 72 notable LVH different p wave morphology from 09/24/20  Cardiac Event Monitoring: Date: 11/04/20 Results:  Patient had a minimum heart rate of 90 bpm, maximum heart rate of 184 bpm, and average heart rate of 107 bpm.  Predominant underlying rhythm was sinus rhythm.  Twenty three runs of supraventricular tachycardia occurred lasting 14 seconds at longest with a max rate of 184 bpm at fastest.  Isolated PACs were  occasional (4.6%), with rare couplets and triplets present.  Isolated PVCs were rare (<1.0%), with rare couplets present.  No evidence of complete heart block.  Triggered and diary events associated with sinus rhythm with isolated PACs.   Asymptomatic SVT.   Transthoracic Echocardiogram: Date: 11/09/2014 Results: Study Conclusions  - Left ventricle: The cavity size was normal. Wall thickness was  normal. Systolic function was normal. The estimated ejection  fraction was in the range of 55% to 60%. Wall motion was normal;  there were no regional wall motion abnormalities. Doppler  parameters are consistent with abnormal left ventricular  relaxation (grade 1 diastolic dysfunction).  - Mitral valve: There was mild regurgitation.  - Right atrium: The atrium was mildly dilated.  - Tricuspid valve: There was moderate regurgitation.  - Pulmonary arteries: PA peak pressure: 34 mm Hg (S).   NM V/Q Scan Date: 10/07/2020 Physiologic distribution of radiopharmaceutical throughout both lungs. Slight heterogeneity of radiotracer without segmental defect.  IMPRESSION: Low probability of pulmonary embolism.  Recent Labs: 10/06/2020: Hemoglobin 12.0; Platelets 338.0; TSH 2.87 10/24/2020: BUN 21; Creatinine, Ser 1.31; Magnesium 1.5; NT-Pro BNP 7,418; Potassium 4.0; Sodium 138  Recent Lipid Panel    Component Value Date/Time   CHOL 155 06/26/2019 1035   TRIG 121 06/26/2019  1035   HDL 39 (L) 06/26/2019 1035   CHOLHDL 4.0 06/26/2019 1035   CHOLHDL 3.3 08/10/2014 1023   VLDL 15 08/10/2014 1023   LDLCALC 94 06/26/2019 1035    Risk Assessment/Calculations:     The 10-year ASCVD risk score Mikey Bussing DC Brooke Bonito., et al., 2013) is: 18.3%   Values used to calculate the score:     Age: 72 years     Sex: Male     Is Non-Hispanic African American: Yes     Diabetic: No     Tobacco smoker: No     Systolic Blood Pressure: 381 mmHg     Is BP treated: Yes     HDL Cholesterol: 39 mg/dL     Total Cholesterol: 155 mg/dL   Physical Exam:    VS:  BP 138/90   Pulse (!) 106   Ht 6' 2.5" (1.892 m)   Wt 212 lb 9.6 oz (96.4 kg)   SpO2 97%   BMI 26.93 kg/m     Wt Readings from Last 3 Encounters:  12/08/20 212 lb 9.6 oz (96.4 kg)  10/31/20 215 lb (97.5 kg)  10/14/20 215 lb 3.2 oz (97.6 kg)     GEN: Well nourished, well developed in no acute distress HEENT: Normal NECK: No JVD; No carotid bruits LYMPHATICS: No lymphadenopathy CARDIAC: Regular tachycardia,II/VI holosystolic murmur, no rubs, gallops RESPIRATORY:  Clear to auscultation without rales, wheezing or rhonchi  ABDOMEN: Soft, non-tender, non-distended MUSCULOSKELETAL:  +3 edema; No deformity  SKIN: Warm and dry NEUROLOGIC:  Alert and oriented x 3 PSYCHIATRIC:  Normal affect   ASSESSMENT:    1. Acute on chronic systolic heart failure ([place])   2. Heart failure, type unknown ([place])   3. SVT (supraventricular tachycardia) ([place])   4. Atrial tachycardia (HCC)    PLAN:    In order of problems listed above:  Heart Failure Reduced EF LVH  SVT- likely AT - NYHA class III, Stage C, hypervolemic, etiology from unknown cause- concern for hypertrophy given ECG - Diuretic regimen: Lasix increase to 40 mg PO BID (patient has not been taking this dose as he has forgotten) - Strict I/Os, daily weights, and fluid restriction of < 2 L  -  SHARED DECISION MAKING:  Offered admission to patient who notes that he feels fine  and would like to defer at this time - discussed the importance of taking the lasix medication; patient amenable to this increased dose - will continue low dose BB - ordering LHC and RHC; would not be surprised of RHC pressures will lead to admission; patient is aware Risks and benefits of cardiac catheterization have been discussed with the patient.  These include bleeding, infection, kidney damage, stroke, heart attack, death.  The patient understands these risks and is willing to proceed.    Medication Adjustments/Labs and Tests Ordered: Current medicines are reviewed at length with the patient today.  Concerns regarding medicines are outlined above.  Orders Placed This Encounter  Procedures  . CBC  . Basic metabolic panel   No orders of the defined types were placed in this encounter.   Patient Instructions  Medication Instructions:  Your physician recommends that you continue on your current medications as directed. Please refer to the Current Medication list given to you today.  *If you need a refill on your cardiac medications before your next appointment, please call your pharmacy*   Lab Work: TODAY: CBC and BMET If you have labs (blood work) drawn today and your tests are completely normal, you will receive your results only by: Marland Kitchen MyChart Message (if you have MyChart) OR . A paper copy in the mail If you have any lab test that is abnormal or we need to change your treatment, we will call you to review the results.   Testing/Procedures: Your physician has requested that you have a cardiac catheterization. Cardiac catheterization is used to diagnose and/or treat various heart conditions. Doctors may recommend this procedure for a number of different reasons. The most common reason is to evaluate chest pain. Chest pain can be a symptom of coronary artery disease (CAD), and cardiac catheterization can show whether plaque is narrowing or blocking your heart's arteries. This  procedure is also used to evaluate the valves, as well as measure the blood flow and oxygen levels in different parts of your heart.  Please follow instruction sheet, as given.  Due to recent COVID-19 restrictions implemented by our local and state authorities and in an effort to keep both patients and staff as safe as possible, our hospital system requires COVID-19 testing prior to certain scheduled hospital procedures.  Please go to [address]. [place], [place] [address] on December 09, 2020 at 10 am  .  This is a drive up testing site.  You will not need to exit your vehicle.  You will not be billed at the time of testing but may receive a bill later depending on your insurance. You must agree to self-quarantine from the time of your testing until the procedure date on December 12, 2020 .  This should included staying home with ONLY the people you live with.  Avoid take-out, grocery store shopping or leaving the house for any non-emergent reason.  Failure to have your COVID-19 test done on the date and time you have been scheduled will result in cancellation of your procedure.  Please call our office at [phone] if you have any questions.     Follow-Up: At [facility], you and your health needs are our priority.  As part of our continuing mission to provide you with exceptional heart care, we have created designated Provider Care Teams.  These Care Teams include your primary Cardiologist (physician) and Advanced Practice Providers (APPs -  Physician Assistants and Nurse Practitioners) who all work together to provide you with the care you need, when you need it.  We recommend signing up for the patient portal called "MyChart".  Sign up information is provided on this After Visit Summary.  MyChart is used to connect with patients for Virtual Visits (Telemedicine).  Patients are able to view lab/test results, encounter notes, upcoming appointments, etc.  Non-urgent messages can be sent to your  provider as well.   To learn more about what you can do with MyChart, go to [email or web address].    Your next appointment:   1 month(s)  The format for your next appointment:   In Person  Provider:   You may see [name], MD or one of the following Advanced Practice Providers on your designated Care Team:    Melina Copa, PA-C  Ermalinda Barrios, PA-C          Signed, [name], MD  12/08/2020 5:58 PM    [place]

## 2020-12-08 NOTE — Progress Notes (Signed)
Urgent findings reported to Dr. Gasper Sells Urgent findings reported time 15:24 Urgent findings acknowledge Yes Disposition of patient at discharge (if patient is discharged) Discharged to DOD

## 2020-12-08 NOTE — Patient Instructions (Addendum)
Medication Instructions:  Your physician recommends that you continue on your current medications as directed. Please refer to the Current Medication list given to you today.  *If you need a refill on your cardiac medications before your next appointment, please call your pharmacy*   Lab Work: TODAY: CBC and BMET If you have labs (blood work) drawn today and your tests are completely normal, you will receive your results only by: Marland Kitchen MyChart Message (if you have MyChart) OR . A paper copy in the mail If you have any lab test that is abnormal or we need to change your treatment, we will call you to review the results.   Testing/Procedures: Your physician has requested that you have a cardiac catheterization. Cardiac catheterization is used to diagnose and/or treat various heart conditions. Doctors may recommend this procedure for a number of different reasons. The most common reason is to evaluate chest pain. Chest pain can be a symptom of coronary artery disease (CAD), and cardiac catheterization can show whether plaque is narrowing or blocking your heart's arteries. This procedure is also used to evaluate the valves, as well as measure the blood flow and oxygen levels in different parts of your heart.  Please follow instruction sheet, as given.  Due to recent COVID-19 restrictions implemented by our local and state authorities and in an effort to keep both patients and staff as safe as possible, our hospital system requires COVID-19 testing prior to certain scheduled hospital procedures.  Please go to [address]. [place], [place] [address] on December 09, 2020 at 10 am  .  This is a drive up testing site.  You will not need to exit your vehicle.  You will not be billed at the time of testing but may receive a bill later depending on your insurance. You must agree to self-quarantine from the time of your testing until the procedure date on December 12, 2020 .  This should included staying home with  ONLY the people you live with.  Avoid take-out, grocery store shopping or leaving the house for any non-emergent reason.  Failure to have your COVID-19 test done on the date and time you have been scheduled will result in cancellation of your procedure.  Please call our office at [phone] if you have any questions.     Follow-Up: At [facility], you and your health needs are our priority.  As part of our continuing mission to provide you with exceptional heart care, we have created designated Provider Care Teams.  These Care Teams include your primary Cardiologist (physician) and Advanced Practice Providers (APPs -  Physician Assistants and Nurse Practitioners) who all work together to provide you with the care you need, when you need it.  We recommend signing up for the patient portal called "MyChart".  Sign up information is provided on this After Visit Summary.  MyChart is used to connect with patients for Virtual Visits (Telemedicine).  Patients are able to view lab/test results, encounter notes, upcoming appointments, etc.  Non-urgent messages can be sent to your provider as well.   To learn more about what you can do with MyChart, go to [email or web address].    Your next appointment:   1 month(s)  The format for your next appointment:   In Person  Provider:   You may see [name], MD or one of the following Advanced Practice Providers on your designated Care Team:    Melina Copa, PA-C  Ermalinda Barrios, PA-C

## 2020-12-08 NOTE — H&P (View-Only) (Signed)
Cardiology Office Note:    Date:  12/08/2020   ID:  Stephen Whitaker, Stephen Whitaker 1955/09/10, MRN [id number]  PCP:  [name], NP   [facility]  Cardiologist:  Rudean Haskell MD Advanced Practice Provider:  No care team member to display Electrophysiologist:  None       CC: Referral after LE swelling Consulted for the evaluation of CHF at the behest of Dr. Melvyn Novas  History of Present Illness:    Dewon Mendizabal Caniglia is a 65 y.o. male with a hx of HTN, COPD, GERD, moderate TR, Gout who presents for evaluation 10/14/20.  In interim has had new runs of SVT and started on low dose BB.  Add on for 12/08/20.  Patient notes that he is doing fine.  No chest pain or pressure.  No SOB, but some DOE at work and no PND/Orthopnea.  No weight gain and note improvement in leg swelling.  No palpitations or syncope.  Patient notes he has forgotten some of his increase lasix dosing and not been taking it as expected.  Since last visit notes has runs of SVT on heart monitor and started low dose BB.  Relevant interval testing or therapy include echocardiogram done today:  Sonographer called me for review:  EF < 20%, Moderate TR, moderate MR, RV enlargement; Bi-atrial severe enlargement.  Small pericardial effusion without tamponade.  No LV thrombus.  Significant changes from prior.   Past Medical History:  Diagnosis Date  . Abnormal liver function tests   . Arthritis   . Boil of buttock   . COPD (chronic obstructive pulmonary disease) (HCC)    Chronic Bronchitis.Advair inhaler   . Dyspnea   . GERD (gastroesophageal reflux disease)    takes Protonix daily  . Gout    takes Allopurinol daily  . Hemorrhoids   . Hypertension    takes HCTZ daily  . Hypomagnesemia   . Leg ulcer ([place])   . Mild mitral regurgitation   . Pancreatitis    10 years ago  . Pneumonia 2016  . Tricuspid regurgitation   . Tubular adenoma of colon     Past Surgical History:  Procedure Laterality  Date  . AMPUTATION Left 07/14/2014   Procedure: 2nd Toe Amputation;  Surgeon: [name], MD;  Location: [facility];  Service: Orthopedics;  Laterality: Left;  . AMPUTATION Left 11/16/2015   Procedure: Left Great Toe Amputation at Metatarsophalangeal Joint;  Surgeon: [name], MD;  Location: [facility];  Service: Orthopedics;  Laterality: Left;  . AMPUTATION Right 06/13/2016   Procedure: RIGHT 2nd Toe Amputation;  Surgeon: [name], MD;  Location: [facility];  Service: Orthopedics;  Laterality: Right;  . AMPUTATION Left 10/19/2016   Procedure: Amputation Left 3rd Toe;  Surgeon: [name], MD;  Location: [facility];  Service: Orthopedics;  Laterality: Left;  . TOE AMPUTATION Left 11/16/2015   left great toe   . TOE AMPUTATION Right 06/13/2016  . TONSILLECTOMY      Current Medications: Current Meds  Medication Sig  . albuterol (VENTOLIN HFA) 108 (90 Base) MCG/ACT inhaler Inhale 2 puffs into the lungs every 4 (four) hours as needed for wheezing or shortness of breath.  . allopurinol (ZYLOPRIM) 300 MG tablet Take 1 tablet (300 mg total) by mouth daily.  Marland Kitchen amLODipine (NORVASC) 10 MG tablet TAKE 1 TABLET BY MOUTH ONCE DAILY **  MUST  KEEP  UPCOMING  OFFICE  VISIT  FOR  REFILLS**  . Bioflavonoid  Products (ESTER-C) TABS Take 1 tablet by mouth daily.  . Blood Pressure Monitoring DEVI 1 Units by Does not apply route daily. Measure blood pressure once a day  . budesonide-formoterol (SYMBICORT) 160-4.5 MCG/ACT inhaler USE 2 INHALATIONS TWICE A DAY  . Colchicine 0.6 MG CAPS Take 1 capsule by mouth daily for 6 doses. ONLY AS NEEDED FOR GOUT FLARE  . fluticasone (FLONASE) 50 MCG/ACT nasal spray Place 1 spray into both nostrils 2 (two) times daily.  . furosemide (LASIX) 40 MG tablet Take 1.5 tablets (60 mg total) by mouth daily.  Marland Kitchen loratadine (CLARITIN) 10 MG tablet Take 10 mg by mouth daily.  . Magnesium Oxide 400 MG CAPS Take 1 capsule (400 mg total) by mouth daily.  . meloxicam (MOBIC) 7.5 MG tablet Take  1 tablet (7.5 mg total) by mouth daily.  . metoprolol tartrate (LOPRESSOR) 25 MG tablet Take 0.5 tablets (12.5 mg total) by mouth 2 (two) times daily.  . montelukast (SINGULAIR) 10 MG tablet TAKE 1 TABLET AT BEDTIME  . pantoprazole (PROTONIX) 40 MG tablet Take 80 mg by mouth 2 (two) times daily. Take 1 tab AM, 1 tab pm  . SPIRULINA PO Take 1 capsule by mouth 3 (three) times daily.  . [DISCONTINUED] pantoprazole (PROTONIX) 40 MG tablet TAKE 1 TABLET BY MOUTH 30 TO 60 MINUTES BEFORE YOUR FIRST AND LAST MEALS OF THE DAY (Patient taking differently: Take 80 mg by mouth 2 (two) times daily. TAKE 1 TABLET BY MOUTH  AM, 1 Tab PM)     Allergies:   Ciprofloxacin, Shellfish allergy, and Sulfa antibiotics   Social History   Socioeconomic History  . Marital status: Single    Spouse name: Not on file  . Number of children: 1  . Years of education:    . Highest education level: Not on file  Occupational History  . Not on file  Tobacco Use  . Smoking status: Never Smoker  . Smokeless tobacco: Never Used  Vaping Use  . Vaping Use: Never used  Substance and Sexual Activity  . Alcohol use: Yes    Alcohol/week: 0.0 standard drinks    Comment: rarely  . Drug use: No  . Sexual activity: Not Currently  Other Topics Concern  . Not on file  Social History Narrative  . Not on file   Social Determinants of Health   Financial Resource Strain: Not on file  Food Insecurity: Not on file  Transportation Needs: Not on file  Physical Activity: Not on file  Stress: Not on file  Social Connections: Not on file    Social: Works at a [employer]  Family History: The patient's family history includes Cancer (age of onset: 58) in his sister. He was adopted.  ROS:   Please see the history of present illness.     All other systems reviewed and are negative.  EKGs/Labs/Other Studies Reviewed:    The following studies were reviewed today: EKG:   12/08/20:  Sinus tachycardia Diffuse TWI 10/14/20: Cannot  exclude Atrial Tachycardia  Notable LVH; consider Echo Contrast with study 120/25/2017: SR rate 72 notable LVH different p wave morphology from 09/24/20  Cardiac Event Monitoring: Date: 11/04/20 Results:  Patient had a minimum heart rate of 90 bpm, maximum heart rate of 184 bpm, and average heart rate of 107 bpm.  Predominant underlying rhythm was sinus rhythm.  Twenty three runs of supraventricular tachycardia occurred lasting 14 seconds at longest with a max rate of 184 bpm at fastest.  Isolated PACs were  occasional (4.6%), with rare couplets and triplets present.  Isolated PVCs were rare (<1.0%), with rare couplets present.  No evidence of complete heart block.  Triggered and diary events associated with sinus rhythm with isolated PACs.   Asymptomatic SVT.   Transthoracic Echocardiogram: Date: 11/09/2014 Results: Study Conclusions  - Left ventricle: The cavity size was normal. Wall thickness was  normal. Systolic function was normal. The estimated ejection  fraction was in the range of 55% to 60%. Wall motion was normal;  there were no regional wall motion abnormalities. Doppler  parameters are consistent with abnormal left ventricular  relaxation (grade 1 diastolic dysfunction).  - Mitral valve: There was mild regurgitation.  - Right atrium: The atrium was mildly dilated.  - Tricuspid valve: There was moderate regurgitation.  - Pulmonary arteries: PA peak pressure: 34 mm Hg (S).   NM V/Q Scan Date: 10/07/2020 Physiologic distribution of radiopharmaceutical throughout both lungs. Slight heterogeneity of radiotracer without segmental defect.  IMPRESSION: Low probability of pulmonary embolism.  Recent Labs: 10/06/2020: Hemoglobin 12.0; Platelets 338.0; TSH 2.87 10/24/2020: BUN 21; Creatinine, Ser 1.31; Magnesium 1.5; NT-Pro BNP 7,418; Potassium 4.0; Sodium 138  Recent Lipid Panel    Component Value Date/Time   CHOL 155 06/26/2019 1035   TRIG 121 06/26/2019  1035   HDL 39 (L) 06/26/2019 1035   CHOLHDL 4.0 06/26/2019 1035   CHOLHDL 3.3 08/10/2014 1023   VLDL 15 08/10/2014 1023   LDLCALC 94 06/26/2019 1035    Risk Assessment/Calculations:     The 10-year ASCVD risk score Mikey Bussing DC Brooke Bonito., et al., 2013) is: 18.3%   Values used to calculate the score:     Age: 64 years     Sex: Male     Is Non-Hispanic African American: Yes     Diabetic: No     Tobacco smoker: No     Systolic Blood Pressure: 801 mmHg     Is BP treated: Yes     HDL Cholesterol: 39 mg/dL     Total Cholesterol: 155 mg/dL   Physical Exam:    VS:  BP 138/90   Pulse (!) 106   Ht 6' 2.5" (1.892 m)   Wt 212 lb 9.6 oz (96.4 kg)   SpO2 97%   BMI 26.93 kg/m     Wt Readings from Last 3 Encounters:  12/08/20 212 lb 9.6 oz (96.4 kg)  10/31/20 215 lb (97.5 kg)  10/14/20 215 lb 3.2 oz (97.6 kg)     GEN: Well nourished, well developed in no acute distress HEENT: Normal NECK: No JVD; No carotid bruits LYMPHATICS: No lymphadenopathy CARDIAC: Regular tachycardia,II/VI holosystolic murmur, no rubs, gallops RESPIRATORY:  Clear to auscultation without rales, wheezing or rhonchi  ABDOMEN: Soft, non-tender, non-distended MUSCULOSKELETAL:  +3 edema; No deformity  SKIN: Warm and dry NEUROLOGIC:  Alert and oriented x 3 PSYCHIATRIC:  Normal affect   ASSESSMENT:    1. Acute on chronic systolic heart failure ([place])   2. Heart failure, type unknown ([place])   3. SVT (supraventricular tachycardia) ([place])   4. Atrial tachycardia (HCC)    PLAN:    In order of problems listed above:  Heart Failure Reduced EF LVH  SVT- likely AT - NYHA class III, Stage C, hypervolemic, etiology from unknown cause- concern for hypertrophy given ECG - Diuretic regimen: Lasix increase to 40 mg PO BID (patient has not been taking this dose as he has forgotten) - Strict I/Os, daily weights, and fluid restriction of < 2 L  -  SHARED DECISION MAKING:  Offered admission to patient who notes that he feels fine  and would like to defer at this time - discussed the importance of taking the lasix medication; patient amenable to this increased dose - will continue low dose BB - ordering LHC and RHC; would not be surprised of RHC pressures will lead to admission; patient is aware Risks and benefits of cardiac catheterization have been discussed with the patient.  These include bleeding, infection, kidney damage, stroke, heart attack, death.  The patient understands these risks and is willing to proceed.    Medication Adjustments/Labs and Tests Ordered: Current medicines are reviewed at length with the patient today.  Concerns regarding medicines are outlined above.  Orders Placed This Encounter  Procedures  . CBC  . Basic metabolic panel   No orders of the defined types were placed in this encounter.   Patient Instructions  Medication Instructions:  Your physician recommends that you continue on your current medications as directed. Please refer to the Current Medication list given to you today.  *If you need a refill on your cardiac medications before your next appointment, please call your pharmacy*   Lab Work: TODAY: CBC and BMET If you have labs (blood work) drawn today and your tests are completely normal, you will receive your results only by: Marland Kitchen MyChart Message (if you have MyChart) OR . A paper copy in the mail If you have any lab test that is abnormal or we need to change your treatment, we will call you to review the results.   Testing/Procedures: Your physician has requested that you have a cardiac catheterization. Cardiac catheterization is used to diagnose and/or treat various heart conditions. Doctors may recommend this procedure for a number of different reasons. The most common reason is to evaluate chest pain. Chest pain can be a symptom of coronary artery disease (CAD), and cardiac catheterization can show whether plaque is narrowing or blocking your heart's arteries. This  procedure is also used to evaluate the valves, as well as measure the blood flow and oxygen levels in different parts of your heart.  Please follow instruction sheet, as given.  Due to recent COVID-19 restrictions implemented by our local and state authorities and in an effort to keep both patients and staff as safe as possible, our hospital system requires COVID-19 testing prior to certain scheduled hospital procedures.  Please go to [place]. [place], [place] [address] on December 09, 2020 at 10 am  .  This is a drive up testing site.  You will not need to exit your vehicle.  You will not be billed at the time of testing but may receive a bill later depending on your insurance. You must agree to self-quarantine from the time of your testing until the procedure date on December 12, 2020 .  This should included staying home with ONLY the people you live with.  Avoid take-out, grocery store shopping or leaving the house for any non-emergent reason.  Failure to have your COVID-19 test done on the date and time you have been scheduled will result in cancellation of your procedure.  Please call our office at [phone] if you have any questions.     Follow-Up: At [facility], you and your health needs are our priority.  As part of our continuing mission to provide you with exceptional heart care, we have created designated Provider Care Teams.  These Care Teams include your primary Cardiologist (physician) and Advanced Practice Providers (APPs -  Physician Assistants and Nurse Practitioners) who all work together to provide you with the care you need, when you need it.  We recommend signing up for the patient portal called "MyChart".  Sign up information is provided on this After Visit Summary.  MyChart is used to connect with patients for Virtual Visits (Telemedicine).  Patients are able to view lab/test results, encounter notes, upcoming appointments, etc.  Non-urgent messages can be sent to your  provider as well.   To learn more about what you can do with MyChart, go to [email or web address].    Your next appointment:   1 month(s)  The format for your next appointment:   In Person  Provider:   You may see [name], MD or one of the following Advanced Practice Providers on your designated Care Team:    Melina Copa, PA-C  Ermalinda Barrios, PA-C          Signed, [name], MD  12/08/2020 5:58 PM    [place]

## 2020-12-09 ENCOUNTER — Other Ambulatory Visit (HOSPITAL_COMMUNITY)
Admission: RE | Admit: 2020-12-09 | Discharge: 2020-12-09 | Disposition: A | Discharge status: Home / Self Care | Payer: BC Managed Care – PPO | Source: Ambulatory Visit | Attending: Cardiovascular Disease | Admitting: Cardiovascular Disease

## 2020-12-09 DIAGNOSIS — Z01812 Encounter for preprocedural laboratory examination: Secondary | ICD-10-CM | POA: Insufficient documentation

## 2020-12-09 DIAGNOSIS — Z79899 Other long term (current) drug therapy: Secondary | ICD-10-CM | POA: Diagnosis not present

## 2020-12-09 DIAGNOSIS — Z882 Allergy status to sulfonamides status: Secondary | ICD-10-CM | POA: Diagnosis not present

## 2020-12-09 DIAGNOSIS — I471 Supraventricular tachycardia: Secondary | ICD-10-CM | POA: Diagnosis not present

## 2020-12-09 DIAGNOSIS — Z91013 Allergy to seafood: Secondary | ICD-10-CM | POA: Diagnosis not present

## 2020-12-09 DIAGNOSIS — Z20822 Contact with and (suspected) exposure to covid-19: Secondary | ICD-10-CM | POA: Insufficient documentation

## 2020-12-09 DIAGNOSIS — I428 Other cardiomyopathies: Secondary | ICD-10-CM | POA: Diagnosis not present

## 2020-12-09 DIAGNOSIS — Z881 Allergy status to other antibiotic agents status: Secondary | ICD-10-CM | POA: Diagnosis not present

## 2020-12-09 DIAGNOSIS — I110 Hypertensive heart disease with heart failure: Secondary | ICD-10-CM | POA: Diagnosis not present

## 2020-12-09 DIAGNOSIS — I5023 Acute on chronic systolic (congestive) heart failure: Secondary | ICD-10-CM | POA: Diagnosis not present

## 2020-12-09 LAB — BASIC METABOLIC PANEL
BUN/Creatinine Ratio: 15 (ref 10–24)
BUN: 18 mg/dL (ref 8–27)
CO2: 21 mmol/L (ref 20–29)
Calcium: 8.6 mg/dL (ref 8.6–10.2)
Chloride: 101 mmol/L (ref 96–106)
Creatinine, Ser: 1.19 mg/dL (ref 0.76–1.27)
Glucose: 81 mg/dL (ref 65–99)
Potassium: 4.1 mmol/L (ref 3.5–5.2)
Sodium: 139 mmol/L (ref 134–144)
eGFR: 68 mL/min/{1.73_m2} (ref >59)

## 2020-12-09 LAB — CBC
Hematocrit: 36.7 % — ABNORMAL LOW (ref 37.5–51.0)
Hemoglobin: 11.6 g/dL — ABNORMAL LOW (ref 13.0–17.7)
MCH: 26.5 pg — ABNORMAL LOW (ref 26.6–33.0)
MCHC: 31.6 g/dL (ref 31.5–35.7)
MCV: 84 fL (ref 79–97)
Platelets: 275 10*3/uL (ref 150–450)
RBC: 4.37 x10E6/uL (ref 4.14–5.80)
RDW: 13.7 % (ref 11.6–15.4)
WBC: 3.4 10*3/uL (ref 3.4–10.8)

## 2020-12-09 LAB — SARS CORONAVIRUS 2 (TAT 6-24 HRS): SARS Coronavirus 2: NEGATIVE

## 2020-12-09 NOTE — Addendum Note (Signed)
Addended by: Jeremy Johann on: 12/09/2020 03:20 PM   Modules accepted: Orders

## 2020-12-12 ENCOUNTER — Ambulatory Visit (HOSPITAL_COMMUNITY)
Admission: RE | Admit: 2020-12-12 | Discharge: 2020-12-12 | Disposition: A | Discharge status: Home / Self Care | Payer: BC Managed Care – PPO | Attending: Cardiovascular Disease | Admitting: Cardiovascular Disease

## 2020-12-12 ENCOUNTER — Encounter (HOSPITAL_COMMUNITY): Payer: Self-pay | Admitting: Cardiovascular Disease

## 2020-12-12 ENCOUNTER — Ambulatory Visit (HOSPITAL_COMMUNITY)
Admission: RE | Disposition: A | Discharge status: Home / Self Care | Payer: Self-pay | Source: Home / Self Care | Attending: Cardiovascular Disease

## 2020-12-12 ENCOUNTER — Other Ambulatory Visit: Payer: Self-pay

## 2020-12-12 DIAGNOSIS — I428 Other cardiomyopathies: Secondary | ICD-10-CM | POA: Insufficient documentation

## 2020-12-12 DIAGNOSIS — Z20822 Contact with and (suspected) exposure to covid-19: Secondary | ICD-10-CM | POA: Insufficient documentation

## 2020-12-12 DIAGNOSIS — I509 Heart failure, unspecified: Secondary | ICD-10-CM

## 2020-12-12 DIAGNOSIS — I5023 Acute on chronic systolic (congestive) heart failure: Secondary | ICD-10-CM | POA: Diagnosis present

## 2020-12-12 DIAGNOSIS — Z881 Allergy status to other antibiotic agents status: Secondary | ICD-10-CM | POA: Insufficient documentation

## 2020-12-12 DIAGNOSIS — Z91013 Allergy to seafood: Secondary | ICD-10-CM | POA: Insufficient documentation

## 2020-12-12 DIAGNOSIS — Z79899 Other long term (current) drug therapy: Secondary | ICD-10-CM | POA: Diagnosis not present

## 2020-12-12 DIAGNOSIS — Z882 Allergy status to sulfonamides status: Secondary | ICD-10-CM | POA: Diagnosis not present

## 2020-12-12 DIAGNOSIS — I471 Supraventricular tachycardia: Secondary | ICD-10-CM | POA: Diagnosis not present

## 2020-12-12 DIAGNOSIS — I110 Hypertensive heart disease with heart failure: Secondary | ICD-10-CM | POA: Insufficient documentation

## 2020-12-12 HISTORY — PX: RIGHT/LEFT HEART CATH AND CORONARY ANGIOGRAPHY: CATH118266

## 2020-12-12 LAB — POCT I-STAT EG7
Acid-Base Excess: 4 mmol/L — ABNORMAL HIGH (ref 0.0–2.0)
Acid-Base Excess: 4 mmol/L — ABNORMAL HIGH (ref 0.0–2.0)
Bicarbonate: 29.4 mmol/L — ABNORMAL HIGH (ref 20.0–28.0)
Bicarbonate: 29.6 mmol/L — ABNORMAL HIGH (ref 20.0–28.0)
Calcium, Ion: 1.16 mmol/L (ref 1.15–1.40)
Calcium, Ion: 1.12 mmol/L — ABNORMAL LOW (ref 1.15–1.40)
HCT: 37 % — ABNORMAL LOW (ref 39.0–52.0)
HCT: 37 % — ABNORMAL LOW (ref 39.0–52.0)
Hemoglobin: 12.6 g/dL — ABNORMAL LOW (ref 13.0–17.0)
Hemoglobin: 12.6 g/dL — ABNORMAL LOW (ref 13.0–17.0)
O2 Saturation: 60 %
O2 Saturation: 63 %
Potassium: 3.4 mmol/L — ABNORMAL LOW (ref 3.5–5.1)
Potassium: 3.4 mmol/L — ABNORMAL LOW (ref 3.5–5.1)
Sodium: 143 mmol/L (ref 135–145)
Sodium: 142 mmol/L (ref 135–145)
TCO2: 31 mmol/L (ref 22–32)
TCO2: 31 mmol/L (ref 22–32)
pCO2, Ven: 47.1 mmHg (ref 44.0–60.0)
pCO2, Ven: 46.5 mmHg (ref 44.0–60.0)
pH, Ven: 7.404 (ref 7.250–7.430)
pH, Ven: 7.412 (ref 7.250–7.430)
pO2, Ven: 31 mmHg — CL (ref 32.0–45.0)
pO2, Ven: 33 mmHg (ref 32.0–45.0)

## 2020-12-12 LAB — POCT I-STAT 7, (LYTES, BLD GAS, ICA,H+H)
Acid-Base Excess: 4 mmol/L — ABNORMAL HIGH (ref 0.0–2.0)
Bicarbonate: 28 mmol/L (ref 20.0–28.0)
Calcium, Ion: 1.14 mmol/L — ABNORMAL LOW (ref 1.15–1.40)
HCT: 36 % — ABNORMAL LOW (ref 39.0–52.0)
Hemoglobin: 12.2 g/dL — ABNORMAL LOW (ref 13.0–17.0)
O2 Saturation: 99 %
Potassium: 3.4 mmol/L — ABNORMAL LOW (ref 3.5–5.1)
Sodium: 142 mmol/L (ref 135–145)
TCO2: 29 mmol/L (ref 22–32)
pCO2 arterial: 39.8 mmHg (ref 32.0–48.0)
pH, Arterial: 7.456 — ABNORMAL HIGH (ref 7.350–7.450)
pO2, Arterial: 116 mmHg — ABNORMAL HIGH (ref 83.0–108.0)

## 2020-12-12 SURGERY — RIGHT/LEFT HEART CATH AND CORONARY ANGIOGRAPHY
Anesthesia: LOCAL

## 2020-12-12 MED ORDER — NITROGLYCERIN 1 MG/10 ML FOR IR/CATH LAB
INTRA_ARTERIAL | Status: AC
  Filled 2020-12-12: qty 10

## 2020-12-12 MED ORDER — IOHEXOL 350 MG/ML SOLN
INTRAVENOUS | Status: DC | PRN
  Administered 2020-12-12: 30 mL

## 2020-12-12 MED ORDER — METHYLPREDNISOLONE SODIUM SUCC 125 MG IJ SOLR
INTRAMUSCULAR | Status: DC | PRN
  Administered 2020-12-12: 125 mg via INTRAVENOUS

## 2020-12-12 MED ORDER — ASPIRIN 81 MG PO CHEW
81.0000 mg | CHEWABLE_TABLET | ORAL | Status: AC
  Administered 2020-12-12: 81 mg via ORAL

## 2020-12-12 MED ORDER — SODIUM CHLORIDE 0.9% FLUSH: 3.0000 mL | INTRAVENOUS | Status: DC | PRN

## 2020-12-12 MED ORDER — ONDANSETRON HCL 4 MG/2ML IJ SOLN: 4.0000 mg | Freq: Four times a day (QID) | INTRAMUSCULAR | Status: DC | PRN

## 2020-12-12 MED ORDER — LIDOCAINE HCL (PF) 1 % IJ SOLN
INTRAMUSCULAR | Status: DC | PRN
  Administered 2020-12-12: 2 mL

## 2020-12-12 MED ORDER — HEPARIN (PORCINE) IN NACL 1000-0.9 UT/500ML-% IV SOLN
INTRAVENOUS | Status: DC | PRN
  Administered 2020-12-12 (×2): 500 mL

## 2020-12-12 MED ORDER — ACETAMINOPHEN 325 MG PO TABS: 650.0000 mg | ORAL_TABLET | ORAL | Status: DC | PRN

## 2020-12-12 MED ORDER — DIPHENHYDRAMINE HCL 50 MG/ML IJ SOLN
INTRAMUSCULAR | Status: AC
  Filled 2020-12-12: qty 1

## 2020-12-12 MED ORDER — HYDRALAZINE HCL 20 MG/ML IJ SOLN
INTRAMUSCULAR | Status: AC
  Administered 2020-12-12: 10 mg via INTRAVENOUS
  Filled 2020-12-12: qty 1

## 2020-12-12 MED ORDER — LIDOCAINE HCL (PF) 1 % IJ SOLN
INTRAMUSCULAR | Status: AC
  Filled 2020-12-12: qty 30

## 2020-12-12 MED ORDER — FAMOTIDINE IN NACL 20-0.9 MG/50ML-% IV SOLN
INTRAVENOUS | Status: AC | PRN
  Administered 2020-12-12: 20 mg via INTRAVENOUS

## 2020-12-12 MED ORDER — SODIUM CHLORIDE 0.9 % IV SOLN: 250.0000 mL | INTRAVENOUS | Status: DC | PRN

## 2020-12-12 MED ORDER — VERAPAMIL HCL 2.5 MG/ML IV SOLN
INTRAVENOUS | Status: AC
  Filled 2020-12-12: qty 2

## 2020-12-12 MED ORDER — DIPHENHYDRAMINE HCL 50 MG/ML IJ SOLN
INTRAMUSCULAR | Status: DC | PRN
  Administered 2020-12-12: 25 mg via INTRAVENOUS

## 2020-12-12 MED ORDER — SODIUM CHLORIDE 0.9% FLUSH: 3.0000 mL | Freq: Two times a day (BID) | INTRAVENOUS | Status: DC

## 2020-12-12 MED ORDER — LABETALOL HCL 5 MG/ML IV SOLN
10.0000 mg | INTRAVENOUS | Status: DC | PRN
Start: 2020-12-12 — End: 2020-12-12

## 2020-12-12 MED ORDER — SODIUM CHLORIDE 0.9 % IV SOLN: INTRAVENOUS | Status: DC

## 2020-12-12 MED ORDER — HEPARIN SODIUM (PORCINE) 1000 UNIT/ML IJ SOLN
INTRAMUSCULAR | Status: DC | PRN
  Administered 2020-12-12: 5000 [IU] via INTRAVENOUS

## 2020-12-12 MED ORDER — SODIUM CHLORIDE 0.9 % IV SOLN: INTRAVENOUS | Status: AC

## 2020-12-12 MED ORDER — HYDRALAZINE HCL 20 MG/ML IJ SOLN: 10.0000 mg | INTRAMUSCULAR | Status: DC | PRN

## 2020-12-12 MED ORDER — HEPARIN SODIUM (PORCINE) 1000 UNIT/ML IJ SOLN
INTRAMUSCULAR | Status: AC
  Filled 2020-12-12: qty 1

## 2020-12-12 MED ORDER — METHYLPREDNISOLONE SODIUM SUCC 125 MG IJ SOLR
INTRAMUSCULAR | Status: AC
  Filled 2020-12-12: qty 2

## 2020-12-12 MED ORDER — VERAPAMIL HCL 2.5 MG/ML IV SOLN
INTRA_ARTERIAL | Status: DC | PRN
  Administered 2020-12-12: 8 mL via INTRA_ARTERIAL

## 2020-12-12 MED ORDER — HEPARIN (PORCINE) IN NACL 1000-0.9 UT/500ML-% IV SOLN
INTRAVENOUS | Status: AC
  Filled 2020-12-12: qty 1500

## 2020-12-12 MED ORDER — MORPHINE SULFATE (PF) 2 MG/ML IV SOLN
2.0000 mg | INTRAVENOUS | Status: DC | PRN
Start: 2020-12-12 — End: 2020-12-12

## 2020-12-12 MED ORDER — FAMOTIDINE IN NACL 20-0.9 MG/50ML-% IV SOLN
INTRAVENOUS | Status: AC
  Filled 2020-12-12: qty 50

## 2020-12-12 SURGICAL SUPPLY — 14 items
CATH OPTITORQUE TIG 4.0 5F (CATHETERS) ×2 IMPLANT
CATH SWAN GANZ 7F STRAIGHT (CATHETERS) ×2 IMPLANT
DEVICE RAD COMP TR BAND LRG (VASCULAR PRODUCTS) ×2 IMPLANT
GLIDESHEATH SLEND A-KIT 6F 22G (SHEATH) ×2 IMPLANT
GLIDESHEATH SLENDER 7FR .021G (SHEATH) ×2 IMPLANT
GUIDEWIRE INQWIRE 1.5J.035X260 (WIRE) ×1 IMPLANT
INQWIRE 1.5J .035X260CM (WIRE) ×2
KIT HEART LEFT (KITS) ×2 IMPLANT
PACK CARDIAC CATHETERIZATION (CUSTOM PROCEDURE TRAY) ×2 IMPLANT
SHEATH PROBE COVER 6X72 (BAG) ×2 IMPLANT
SHEATH RAIN 4/5FR (SHEATH) ×2 IMPLANT
TRANSDUCER W/STOPCOCK (MISCELLANEOUS) ×2 IMPLANT
TUBING CIL FLEX 10 FLL-RA (TUBING) ×2 IMPLANT
WIRE HI TORQ VERSACORE-J 145CM (WIRE) ×2 IMPLANT

## 2020-12-12 NOTE — Progress Notes (Signed)
Son just arrived to take pt home. Pt discharged with son

## 2020-12-12 NOTE — Discharge Instructions (Signed)
Radial Site Care  This sheet gives you information about how to care for yourself after your procedure. Your health care provider may also give you more specific instructions. If you have problems or questions, contact your health care provider. What can I expect after the procedure? After the procedure, it is common to have:  Bruising and tenderness at the catheter insertion area. Follow these instructions at home: Medicines  Take over-the-counter and prescription medicines only as told by your health care provider. Insertion site care 1. Follow instructions from your health care provider about how to take care of your insertion site. Make sure you: ? Wash your hands with soap and water before you remove your bandage (dressing). If soap and water are not available, use hand sanitizer. ? May remove dressing in 24 hours. 2. Check your insertion site every day for signs of infection. Check for: ? Redness, swelling, or pain. ? Fluid or blood. ? Pus or a bad smell. ? Warmth. 3. Do no take baths, swim, or use a hot tub for 5 days. 4. You may shower 24-48 hours after the procedure. ? Remove the dressing and gently wash the site with plain soap and water. ? Pat the area dry with a clean towel. ? Do not rub the site. That could cause bleeding. 5. Do not apply powder or lotion to the site. Activity  1. For 24 hours after the procedure, or as directed by your health care provider: ? Do not flex or bend the affected arm. ? Do not push or pull heavy objects with the affected arm. ? Do not drive yourself home from the hospital or clinic. You may drive 24 hours after the procedure. ? Do not operate machinery or power tools. ? KEEP ARM ELEVATED THE REMAINDER OF THE DAY. 2. Do not push, pull or lift anything that is heavier than 10 lb for 5 days. 3. Ask your health care provider when it is okay to: ? Return to work or school. ? Resume usual physical activities or sports. ? Resume sexual  activity. General instructions  If the catheter site starts to bleed, raise your arm and put firm pressure on the site. If the bleeding does not stop, get help right away. This is a medical emergency.  DRINK PLENTY OF FLUIDS FOR THE NEXT 2-3 DAYS.  No alcohol consumption for 24 hours after receiving sedation.  If you went home on the same day as your procedure, a responsible adult should be with you for the first 24 hours after you arrive home.  Keep all follow-up visits as told by your health care provider. This is important. Contact a health care provider if:  You have a fever.  You have redness, swelling, or yellow drainage around your insertion site. Get help right away if:  You have unusual pain at the radial site.  The catheter insertion area swells very fast.  The insertion area is bleeding, and the bleeding does not stop when you hold steady pressure on the area.  Your arm or hand becomes pale, cool, tingly, or numb. These symptoms may represent a serious problem that is an emergency. Do not wait to see if the symptoms will go away. Get medical help right away. Call your local emergency services (911 in the [place] Do not drive yourself to the hospital. Summary  After the procedure, it is common to have bruising and tenderness at the site.  Follow instructions from your health care provider about how to take care   of your radial site wound. Check the wound every day for signs of infection.  This information is not intended to replace advice given to you by your health care provider. Make sure you discuss any questions you have with your health care provider. Document Revised: 09/11/2017 Document Reviewed: 09/11/2017 Elsevier Patient Education  2020 Elsevier Inc[employer] 

## 2020-12-12 NOTE — Progress Notes (Addendum)
Pt ambulated without difficulty or bleeding.    Arm board placed.  Will be  Discharged home with son  who will drive and stay with pt x 24 hrs

## 2020-12-12 NOTE — Research (Signed)
[facility] Informed Consent   Subject Name: BERGEN MAGNER  Subject met inclusion and exclusion criteria.  The informed consent form, study requirements and expectations were reviewed with the subject and questions and concerns were addressed prior to the signing of the consent form.  The subject verbalized understanding of the trail requirements.  The subject agreed to participate in the [facility] trial and signed the informed consent.  The informed consent was obtained prior to performance of any protocol-specific procedures for the subject.  A copy of the signed informed consent was given to the subject and a copy was placed in the subject's medical record.  Philemon Kingdom D 12/12/2020, 0715 am

## 2020-12-12 NOTE — Interval H&P Note (Signed)
Cath Lab Visit (complete for each Cath Lab visit)  Clinical Evaluation Leading to the Procedure:   ACS: No.  Non-ACS:    Anginal Classification: CCS II  Anti-ischemic medical therapy: Maximal Therapy (2 or more classes of medications)  Non-Invasive Test Results: No non-invasive testing performed  Prior CABG: No previous CABG      History and Physical Interval Note:  12/12/2020 8:21 AM  Stephen Whitaker  has presented today for surgery, with the diagnosis of acute onchronic heart failure.  The various methods of treatment have been discussed with the patient and family. After consideration of risks, benefits and other options for treatment, the patient has consented to  Procedure(s): RIGHT/LEFT HEART CATH AND CORONARY ANGIOGRAPHY (N/A) as a surgical intervention.  The patient's history has been reviewed, patient examined, no change in status, stable for surgery.  I have reviewed the patient's chart and labs.  Questions were answered to the patient's satisfaction.     Quay Burow

## 2020-12-13 MED FILL — Heparin Sod (Porcine)-NaCl IV Soln 1000 Unit/500ML-0.9%: INTRAVENOUS | Qty: 500 | Status: AC

## 2021-01-02 ENCOUNTER — Encounter: Payer: Self-pay | Admitting: Physician Assistant

## 2021-01-02 ENCOUNTER — Ambulatory Visit (INDEPENDENT_AMBULATORY_CARE_PROVIDER_SITE_OTHER): Payer: BC Managed Care – PPO | Admitting: Physician Assistant

## 2021-01-02 DIAGNOSIS — L97521 Non-pressure chronic ulcer of other part of left foot limited to breakdown of skin: Secondary | ICD-10-CM

## 2021-01-02 NOTE — Progress Notes (Signed)
Office Visit Note   Patient: Stephen Whitaker           Date of Birth: 08/20/1956           MRN: [id number] Visit Date: 01/02/2021              Requested by: [name], NP [address] [place],  [place] [address] PCP: [name], NP  Chief Complaint  Patient presents with  . Left Foot - Follow-up      HPI: Patient is a pleasant 65 year old gentleman who follows up on his left medial plantar and great toe foot ulcers.  He has pads in his shoes to offload these.  His only complaint is of the medial plantar callus he.  He finds his area of his foot somewhat painful  Assessment & Plan: Visit Diagnoses: No diagnosis found.  Plan: Overall pads are still in good condition and have some wear left.  He should continue wearing the sock washing his feet daily.  He should also try stretching.  Follow-up in 3 weeks  Follow-Up Instructions: No follow-ups on file.   Ortho Exam  Patient is alert, oriented, no adenopathy, well-dressed, normal affect, normal respiratory effort. Ulcer beneath his great toe has macerated skin which was debrided down to bleeding surfaces measures out 3 cm in diameter.  After debridement it went from being 2 mm deep to 1 mm deep does not probe deeply to bone no surrounding cellulitis  Examination of his medial plantar callus.  There is no ascending cellulitis no signs of infection he does have significant planovalgus collapse secondary to a Charcot foot.  After verbal consent was obtained the callus was debrided to softer skin.  There is no exposed bone no drainage no ascending cellulitis  Imaging: No results found. No images are attached to the encounter.  Labs: Lab Results  Component Value Date   HGBA1C 5.6 08/10/2014   ESRSEDRATE 27 (H) 07/05/2015   ESRSEDRATE 80 (H) 01/21/2015   CRP 1.8 (H) 07/05/2015   LABURIC 5.9 07/28/2020   LABURIC 5.4 02/19/2018   LABURIC 6.0 08/10/2014   REPTSTATUS 01/22/2015 FINAL 01/21/2015   CULT NO  GROWTH Performed at [facility]  01/21/2015     Lab Results  Component Value Date   ALBUMIN 3.9 06/26/2019   ALBUMIN 3.9 10/22/2018   ALBUMIN 4.1 06/23/2018    Lab Results  Component Value Date   MG 1.5 (L) 10/24/2020   MG 1.8 01/21/2015   Lab Results  Component Value Date   VD25OH 32.4 01/23/2017    No results found for: PREALBUMIN CBC EXTENDED Latest Ref Rng & Units 12/12/2020 12/12/2020 12/12/2020  WBC 3.4 - 10.8 x10E3/uL - - -  RBC 4.14 - 5.80 x10E6/uL - - -  HGB 13.0 - 17.0 g/dL 12.2(L) 12.6(L) 12.6(L)  HCT 39.0 - 52.0 % 36.0(L) 37.0(L) 37.0(L)  PLT 150 - 450 x10E3/uL - - -  NEUTROABS 1.4 - 7.7 K/uL - - -  LYMPHSABS 0.7 - 4.0 K/uL - - -     There is no height or weight on file to calculate BMI.  Orders:  No orders of the defined types were placed in this encounter.  No orders of the defined types were placed in this encounter.    Procedures: No procedures performed  Clinical Data: No additional findings.  ROS:  All other systems negative, except as noted in the HPI. Review of Systems  Objective: Vital Signs: There were no vitals taken  for this visit.  Specialty Comments:  No specialty comments available.  PMFS History: Patient Active Problem List   Diagnosis Date Noted  . Acute on chronic systolic heart failure ([place]) 12/08/2020  . Heart failure, type unknown ([place]) 10/14/2020  . Nonspecific abnormal electrocardiogram (ECG) (EKG) 10/14/2020  . Atrial tachycardia ([place]) 10/14/2020  . Non-pressure chronic ulcer of other part of left foot limited to breakdown of skin ([place]) 10/12/2020  . Leg swelling 10/06/2020  . DOE (dyspnea on exertion) 10/06/2020  . Thrombocytopenia ([place]) 06/26/2019  . Acquired absence of other right toe(s) ([place]) 02/19/2018  . Boil of buttock 10/24/2016  . Amputated toe of left foot ([place]) 10/19/2016  . Amputated toe, right ([place]) 06/13/2016  . Chronic bronchitis ([place]) 11/24/2015  . Other pancytopenia ([place]) 01/24/2015   . Chronic pancreatitis ([place]) 01/24/2015  . Blood poisoning   . Neutropenia ([place])   . Iron deficiency anemia due to chronic blood loss   . Leukopenia   . Sepsis ([place]) 01/21/2015  . Leucopenia 01/21/2015  . Anemia 01/21/2015  . Syncope 01/21/2015  . Diarrhea 01/21/2015  . Hypokalemia 01/21/2015  . Loss of consciousness ([place])   . Eczema 10/21/2014  . Essential hypertension 10/21/2014  . Vasovagal syncope 10/21/2014  . Bilateral conjunctivitis 08/10/2014  . Other chronic pancreatitis ([place]) 08/10/2014  . COLD (chronic obstructive lung disease) ([place]) 08/10/2014  . Chronic ethmoidal sinusitis 08/10/2014  . Bronchitis 10/17/2010  . PLANTAR FASCIITIS, LEFT 10/17/2010  . OTHER NEUTROPENIA 04/21/2010  . CALLUS, TOE 03/15/2010  . LEUKOPENIA, MILD 02/20/2010  . LIVER FUNCTION TESTS, ABNORMAL, HX OF 02/16/2010  . DENTAL CARIES 02/15/2010  . ONYCHOMYCOSIS 07/05/2009  . ALLERGIC URTICARIA 07/05/2009  . PAIN IN LIMB 07/05/2009  . ERECTILE DYSFUNCTION 05/25/2008  . INFLUENZA 05/25/2008  . VIRAL INFECTION 10/01/2007  . HSV 05/02/2007  . CONDYLOMA ACUMINATUM 05/02/2007  . HYPERTENSION 05/02/2007  . ALLERGIC RHINITIS 05/02/2007  . Chronic asthma, mild persistent, uncomplicated 15/17/6160  . Gastroesophageal reflux disease without esophagitis 05/02/2007  . PANCREATITIS 05/02/2007   Past Medical History:  Diagnosis Date  . Abnormal liver function tests   . Arthritis   . Boil of buttock   . COPD (chronic obstructive pulmonary disease) (HCC)    Chronic Bronchitis.Advair inhaler   . Dyspnea   . GERD (gastroesophageal reflux disease)    takes Protonix daily  . Gout    takes Allopurinol daily  . Hemorrhoids   . Hypertension    takes HCTZ daily  . Hypomagnesemia   . Leg ulcer ([place])   . Mild mitral regurgitation   . Pancreatitis    10 years ago  . Pneumonia 2016  . Tricuspid regurgitation   . Tubular adenoma of colon     Family History  Adopted: Yes  Problem Relation Age of  Onset  . Cancer Sister 53       unknown origin     Past Surgical History:  Procedure Laterality Date  . AMPUTATION Left 07/14/2014   Procedure: 2nd Toe Amputation;  Surgeon: [name], MD;  Location: [facility];  Service: Orthopedics;  Laterality: Left;  . AMPUTATION Left 11/16/2015   Procedure: Left Great Toe Amputation at Metatarsophalangeal Joint;  Surgeon: [name], MD;  Location: [facility];  Service: Orthopedics;  Laterality: Left;  . AMPUTATION Right 06/13/2016   Procedure: RIGHT 2nd Toe Amputation;  Surgeon: [name], MD;  Location: [facility];  Service: Orthopedics;  Laterality: Right;  . AMPUTATION Left 10/19/2016   Procedure: Amputation Left 3rd  Toe;  Surgeon: [name], MD;  Location: [facility];  Service: Orthopedics;  Laterality: Left;  . RIGHT/LEFT HEART CATH AND CORONARY ANGIOGRAPHY N/A 12/12/2020   Procedure: RIGHT/LEFT HEART CATH AND CORONARY ANGIOGRAPHY;  Surgeon: Lorretta Harp, MD;  Location: [facility];  Service: Cardiovascular;  Laterality: N/A;  . TOE AMPUTATION Left 11/16/2015   left great toe   . TOE AMPUTATION Right 06/13/2016  . TONSILLECTOMY     Social History   Occupational History  . Not on file  Tobacco Use  . Smoking status: Never Smoker  . Smokeless tobacco: Never Used  Vaping Use  . Vaping Use: Never used  Substance and Sexual Activity  . Alcohol use: Yes    Alcohol/week: 0.0 standard drinks    Comment: rarely  . Drug use: No  . Sexual activity: Not Currently

## 2021-01-02 NOTE — Progress Notes (Signed)
Cardiology Office Note:    Date:  01/04/2021   ID:  Stephen Whitaker, DOB February 21, 1956, MRN [id number]  PCP:  [name], NP   [facility]  Cardiologist:  Rudean Haskell MD Advanced Practice Provider:  No care team member to display Electrophysiologist:  None      CC: Follow up HF  History of Present Illness:    Stephen Whitaker is a 65 y.o. male with a hx of HTN, COPD, GERD, moderate TR, Gout who presents for evaluation 10/14/20.  In interim has had new runs of SVT and started on low dose BB.  Add on for 12/08/20 for decompensated HF. Had [place] and RHC with widely patent CORS.  Patient notes that he is doing well.  Since last visit notes no changes.  Relevant interval testing or therapy include LHC and RHC.  There are no interval hospital/ED visit.    No chest pain or pressure.  Breathing fluctuates:  Sometimes gets up and feels sluggish. Notes no bendopnea and no PND/Orthopnea.  Notes weight loss and notes slight improvement in leg swelling.  No palpitations or syncope.  Notes that he occasionally drinks muscadine wine rarely ~ once a week less than one glass. No other alcohol.  Past Medical History:  Diagnosis Date  . Abnormal liver function tests   . Arthritis   . Boil of buttock   . COPD (chronic obstructive pulmonary disease) (HCC)    Chronic Bronchitis.Advair inhaler   . Dyspnea   . GERD (gastroesophageal reflux disease)    takes Protonix daily  . Gout    takes Allopurinol daily  . Hemorrhoids   . Hypertension    takes HCTZ daily  . Hypomagnesemia   . Leg ulcer ([place])   . Mild mitral regurgitation   . Pancreatitis    10 years ago  . Pneumonia 2016  . Tricuspid regurgitation   . Tubular adenoma of colon     Past Surgical History:  Procedure Laterality Date  . AMPUTATION Left 07/14/2014   Procedure: 2nd Toe Amputation;  Surgeon: [name], MD;  Location: [facility];  Service: Orthopedics;  Laterality: Left;  . AMPUTATION Left  11/16/2015   Procedure: Left Great Toe Amputation at Metatarsophalangeal Joint;  Surgeon: [name], MD;  Location: [facility];  Service: Orthopedics;  Laterality: Left;  . AMPUTATION Right 06/13/2016   Procedure: RIGHT 2nd Toe Amputation;  Surgeon: [name], MD;  Location: [facility];  Service: Orthopedics;  Laterality: Right;  . AMPUTATION Left 10/19/2016   Procedure: Amputation Left 3rd Toe;  Surgeon: [name], MD;  Location: [facility];  Service: Orthopedics;  Laterality: Left;  . RIGHT/LEFT HEART CATH AND CORONARY ANGIOGRAPHY N/A 12/12/2020   Procedure: RIGHT/LEFT HEART CATH AND CORONARY ANGIOGRAPHY;  Surgeon: Lorretta Harp, MD;  Location: [facility];  Service: Cardiovascular;  Laterality: N/A;  . TOE AMPUTATION Left 11/16/2015   left great toe   . TOE AMPUTATION Right 06/13/2016  . TONSILLECTOMY      Current Medications: Current Meds  Medication Sig  . albuterol (VENTOLIN HFA) 108 (90 Base) MCG/ACT inhaler Inhale 2 puffs into the lungs every 4 (four) hours as needed for wheezing or shortness of breath.  . allopurinol (ZYLOPRIM) 300 MG tablet Take 1 tablet (300 mg total) by mouth daily.  Marland Kitchen amLODipine (NORVASC) 10 MG tablet TAKE 1 TABLET BY MOUTH ONCE DAILY **  MUST  KEEP  UPCOMING  OFFICE  VISIT  FOR  REFILLS**  . aspirin EC 81 MG tablet Take 81 mg by mouth daily. Swallow whole.  Marland Kitchen Bioflavonoid Products (ESTER-C) TABS Take 1 tablet by mouth daily.  . Blood Pressure Monitoring DEVI 1 Units by Does not apply route daily. Measure blood pressure once a day  . budesonide-formoterol (SYMBICORT) 160-4.5 MCG/ACT inhaler USE 2 INHALATIONS TWICE A DAY  . Colchicine 0.6 MG CAPS Take 1 capsule by mouth daily for 6 doses. ONLY AS NEEDED FOR GOUT FLARE  . fluticasone (FLONASE) 50 MCG/ACT nasal spray Place 1 spray into both nostrils 2 (two) times daily.  Marland Kitchen loratadine (CLARITIN) 10 MG tablet Take 10 mg by mouth daily.  . Magnesium Oxide 400 MG CAPS Take 1 capsule (400 mg total) by mouth  daily.  . meloxicam (MOBIC) 7.5 MG tablet Take 1 tablet (7.5 mg total) by mouth daily.  . montelukast (SINGULAIR) 10 MG tablet TAKE 1 TABLET AT BEDTIME  . pantoprazole (PROTONIX) 40 MG tablet Take 40 mg by mouth 2 (two) times daily. Take 1 tab AM, 1 tab pm  . SPIRULINA PO Take 1 capsule by mouth 3 (three) times daily.  . [DISCONTINUED] furosemide (LASIX) 40 MG tablet Take 1.5 tablets (60 mg total) by mouth daily.  . [DISCONTINUED] metoprolol tartrate (LOPRESSOR) 25 MG tablet Take 0.5 tablets (12.5 mg total) by mouth 2 (two) times daily.     Allergies:   Ciprofloxacin, Shellfish allergy, and Sulfa antibiotics   Social History   Socioeconomic History  . Marital status: Single    Spouse name: Not on file  . Number of children: 1  . Years of education:    . Highest education level: Not on file  Occupational History  . Not on file  Tobacco Use  . Smoking status: Never Smoker  . Smokeless tobacco: Never Used  Vaping Use  . Vaping Use: Never used  Substance and Sexual Activity  . Alcohol use: Yes    Alcohol/week: 0.0 standard drinks    Comment: rarely  . Drug use: No  . Sexual activity: Not Currently  Other Topics Concern  . Not on file  Social History Narrative  . Not on file   Social Determinants of Health   Financial Resource Strain: Not on file  Food Insecurity: Not on file  Transportation Needs: Not on file  Physical Activity: Not on file  Stress: Not on file  Social Connections: Not on file    Social: Works at a [employer]  Family History: The patient's family history includes Cancer (age of onset: 51) in his sister. He was adopted.  ROS:   Please see the history of present illness.     All other systems reviewed and are negative.  EKGs/Labs/Other Studies Reviewed:    The following studies were reviewed today: EKG:   12/08/20:  Sinus tachycardia Diffuse TWI 10/14/20: Cannot exclude Atrial Tachycardia  Notable LVH; consider Echo Contrast with study 120/25/2017:  SR rate 72 notable LVH different p wave morphology from 09/24/20  Cardiac Event Monitoring: Date: 11/04/20 Results:  Patient had a minimum heart rate of 90 bpm, maximum heart rate of 184 bpm, and average heart rate of 107 bpm.  Predominant underlying rhythm was sinus rhythm.  Twenty three runs of supraventricular tachycardia occurred lasting 14 seconds at longest with a max rate of 184 bpm at fastest.  Isolated PACs were occasional (4.6%), with rare couplets and triplets present.  Isolated PVCs were rare (<1.0%), with rare couplets present.  No evidence of complete heart block.  Triggered  and diary events associated with sinus rhythm with isolated PACs.   Asymptomatic SVT.   Transthoracic Echocardiogram: Date: 11/09/2014 Results: Study Conclusions  - Left ventricle: The cavity size was normal. Wall thickness was  normal. Systolic function was normal. The estimated ejection  fraction was in the range of 55% to 60%. Wall motion was normal;  there were no regional wall motion abnormalities. Doppler  parameters are consistent with abnormal left ventricular  relaxation (grade 1 diastolic dysfunction).  - Mitral valve: There was mild regurgitation.  - Right atrium: The atrium was mildly dilated.  - Tricuspid valve: There was moderate regurgitation.  - Pulmonary arteries: PA peak pressure: 34 mm Hg (S).   Date: 12/08/20 Results:   1. Left ventricular ejection fraction, by estimation, is 20 to 25%. The  left ventricle has severely decreased function. The left ventricle  demonstrates global hypokinesis. The left ventricular internal cavity size  was mildly dilated. Left ventricular  diastolic parameters are consistent with Grade II diastolic dysfunction  (pseudonormalization).  2. Right ventricular systolic function is mildly reduced. The right  ventricular size is moderately enlarged. There is moderately elevated  pulmonary artery systolic pressure. The estimated right  ventricular  systolic pressure is [id number] mmHg.  3. Left atrial size was severely dilated.  4. Right atrial size was severely dilated.  5. A small pericardial effusion is present. The pericardial effusion is  posterior to the left ventricle.  6. The mitral valve is normal in structure. Mild to moderate mitral valve  regurgitation.  7. Tricuspid valve regurgitation is moderate.  8. The aortic valve is tricuspid. There is mild thickening of the aortic  valve. Aortic valve regurgitation is trivial. Mild aortic valve sclerosis  is present, with no evidence of aortic valve stenosis.  9. Aortic dilatation noted. There is mild dilatation of the ascending  aorta, measuring 37 mm.  10. The inferior vena cava is dilated in size with <50% respiratory  variability, suggesting right atrial pressure of 15 mmHg.   NM V/Q Scan Date: 10/07/2020 Physiologic distribution of radiopharmaceutical throughout both lungs. Slight heterogeneity of radiotracer without segmental defect.  IMPRESSION: Low probability of pulmonary embolism.  Left/Right Heart Catheterizations: Date:12/12/20 Results: Mr. Knopp has a nonischemic cardiomyopathy.  He has clean coronary arteries.  He has moderately elevated wedge and filling pressures.  Medical therapy will be recommended with guideline directed optimal medical therapy. Dr. Julieanne Manson , the patient's primary cardiologist, was notified of these results.  The radial sheath was removed and a TR band was placed on the right wrist to achieve patent hemostasis.  The patient left lab in stable condition.  Fick Cardiac Output 5.26 L/min  Fick Cardiac Output Index 2.34 (L/min)/BSA  RA A Wave 11 mmHg  RA V Wave 16 mmHg  RA Mean 13 mmHg  RV Systolic Pressure 49 mmHg  RV Diastolic Pressure 1 mmHg  RV EDP 12 mmHg  PA Systolic Pressure 48 mmHg  PA Diastolic Pressure 23 mmHg  PA Mean 33 mmHg  PW A Wave 26 mmHg  PW V Wave 27 mmHg  PW Mean 25 mmHg  AO Systolic Pressure  123XX123 mmHg  AO Diastolic Pressure 81 mmHg  AO Mean 98 mmHg  LV Systolic Pressure 99991111 mmHg  LV Diastolic Pressure 8 mmHg  LV EDP 23 mmHg  AOp Systolic Pressure 123XX123 mmHg  AOp Diastolic Pressure 66 mmHg  AOp Mean Pressure 123XX123 mmHg  LVp Systolic Pressure 123456 mmHg  LVp Diastolic Pressure 8 mmHg  LVp EDP Pressure 22  mmHg  QP/QS 1  TPVR Index 14.1 HRUI  TSVR Index 41.45 HRUI  PVR SVR Ratio 0.1  TPVR/TSVR Ratio 0.34     Recent Labs: 10/06/2020: TSH 2.87 10/24/2020: Magnesium 1.5; NT-Pro BNP 7,418 12/08/2020: BUN 18; Creatinine, Ser 1.19; Platelets 275 12/12/2020: Hemoglobin 12.2; Potassium 3.4; Sodium 142  Recent Lipid Panel    Component Value Date/Time   CHOL 155 06/26/2019 1035   TRIG 121 06/26/2019 1035   HDL 39 (L) 06/26/2019 1035   CHOLHDL 4.0 06/26/2019 1035   CHOLHDL 3.3 08/10/2014 1023   VLDL 15 08/10/2014 1023   LDLCALC 94 06/26/2019 1035    Risk Assessment/Calculations:     The 10-year ASCVD risk score Mikey Bussing DC Brooke Bonito., et al., 2013) is: 16.6%   Values used to calculate the score:     Age: 16 years     Sex: Male     Is Non-Hispanic African American: Yes     Diabetic: No     Tobacco smoker: No     Systolic Blood Pressure: 0000000 mmHg     Is BP treated: Yes     HDL Cholesterol: 39 mg/dL     Total Cholesterol: 155 mg/dL   Physical Exam:    VS:  BP 128/80   Pulse 61   Ht 6' 2.5" (1.892 m)   Wt 93.4 kg   SpO2 99%   BMI 26.10 kg/m     Wt Readings from Last 3 Encounters:  01/04/21 93.4 kg  12/12/20 96.2 kg  12/08/20 96.4 kg     GEN: Well nourished, well developed in no acute distress HEENT: Normal NECK: No JVD; No carotid bruits LYMPHATICS: No lymphadenopathy CARDIAC: Regular rate and rhyth, ,II/VI holosystolic murmur, no rubs, gallops RESPIRATORY:  Clear to auscultation without rales, wheezing or rhonchi  ABDOMEN: Soft, non-tender, non-distended MUSCULOSKELETAL:  +3 edema up to inner thigh but improved from prior; No deformity  SKIN: Warm and dry NEUROLOGIC:   Alert and oriented x 3 PSYCHIATRIC:  Normal affect   ASSESSMENT:    1. Atrial tachycardia ([place])   2. Acute on chronic systolic heart failure ([place])   3. LVH (left ventricular hypertrophy)    PLAN:    In order of problems listed above:  Heart Failure Reduced EF LVH  SVT- likely AT - NYHA class III, Stage C, hypervolemic, etiology from unclear cause with negative ischemic work up - Diuretic regimen: Lasix increase to 80 mg PO daily  (patient has not been taken BID dosing because of work in the past) - adding Fargixa 10 mg PO Daily or other SGLT2i if favorable per insurance (cost) - transition metoprolol to 25 mg PO Daily - BMP in one week - ideally, will see in close follow up and seen in Pharm D clinic - goal is to start Entresto at next visit, if needing to stop norvasc for BP room this can be done - based on creatinine response to the above, would add MRA in lieu of potassium supplementation  - Strict I/Os, daily weights, and fluid restriction of < 2 L   Ideally ~6 weeks follow up  Would be reasonable for  APP Follow up     Medication Adjustments/Labs and Tests Ordered: Current medicines are reviewed at length with the patient today.  Concerns regarding medicines are outlined above.  No orders of the defined types were placed in this encounter.  No orders of the defined types were placed in this encounter.   There are no Patient Instructions on file  for this visit.   Signed, [name], MD  01/04/2021 11:03 AM    [place]

## 2021-01-04 ENCOUNTER — Encounter: Payer: Self-pay | Admitting: Internal Medicine

## 2021-01-04 ENCOUNTER — Telehealth: Payer: Self-pay | Admitting: Pharmacist

## 2021-01-04 ENCOUNTER — Ambulatory Visit (INDEPENDENT_AMBULATORY_CARE_PROVIDER_SITE_OTHER): Payer: BC Managed Care – PPO | Admitting: Internal Medicine

## 2021-01-04 ENCOUNTER — Other Ambulatory Visit: Payer: Self-pay

## 2021-01-04 VITALS — BP 128/80 | HR 61 | Ht 74.5 in | Wt 206.0 lb

## 2021-01-04 DIAGNOSIS — I5023 Acute on chronic systolic (congestive) heart failure: Secondary | ICD-10-CM

## 2021-01-04 DIAGNOSIS — I471 Supraventricular tachycardia: Secondary | ICD-10-CM | POA: Diagnosis not present

## 2021-01-04 DIAGNOSIS — I517 Cardiomegaly: Secondary | ICD-10-CM

## 2021-01-04 MED ORDER — METOPROLOL SUCCINATE ER 25 MG PO TB24: 25.0000 mg | ORAL_TABLET | Freq: Every day | ORAL | 3 refills | Status: DC

## 2021-01-04 MED ORDER — FUROSEMIDE 80 MG PO TABS: 80.0000 mg | ORAL_TABLET | Freq: Every day | ORAL | 3 refills | Status: DC

## 2021-01-04 NOTE — Patient Instructions (Signed)
Medication Instructions:  Your physician has recommended you make the following change in your medication:   INCREASE: furosemide (Lasix) to 80 mg by mouth daily STOP: metoprolol tartrate  START: metoprolol succinate 25 mg by mouth daily  *If you need a refill on your cardiac medications before your next appointment, please call your pharmacy*   Lab Work: NONE If you have labs (blood work) drawn today and your tests are completely normal, you will receive your results only by: Marland Kitchen MyChart Message (if you have MyChart) OR . A paper copy in the mail If you have any lab test that is abnormal or we need to change your treatment, we will call you to review the results.   Testing/Procedures: Your physician has requested that you see our Pharmacist to assist with managing Heart Failure   Follow-Up: At [facility], you and your health needs are our priority.  As part of our continuing mission to provide you with exceptional heart care, we have created designated Provider Care Teams.  These Care Teams include your primary Cardiologist (physician) and Advanced Practice Providers (APPs -  Physician Assistants and Nurse Practitioners) who all work together to provide you with the care you need, when you need it.  We recommend signing up for the patient portal called "MyChart".  Sign up information is provided on this After Visit Summary.  MyChart is used to connect with patients for Virtual Visits (Telemedicine).  Patients are able to view lab/test results, encounter notes, upcoming appointments, etc.  Non-urgent messages can be sent to your provider as well.   To learn more about what you can do with MyChart, go to [email or web address].    Your next appointment:   4-6 week(s)  The format for your next appointment:   In Person  Provider:   You may see [name], MD or one of the following Advanced Practice Providers on your designated Care Team:    Melina Copa,  PA-C  Ermalinda Barrios, PA-C   Other Instructions Our pharmacist will contact you in regards to starting a new medication to manage Heart Failure

## 2021-01-04 NOTE — Telephone Encounter (Addendum)
Pt to start SGLT2i therapy today per insurance coverage. He has Pharmacist, community as follows, unable to see which is preferred on formulary. Will submit prior auth request and follow up with pt after. He will qualify for copay card.  BIN: [id number] GRPMarden [place] ID: [id number] [place], although [employer] reviews PA requests Phone # [phone]

## 2021-01-09 ENCOUNTER — Encounter: Payer: Self-pay | Admitting: Internal Medicine

## 2021-01-09 ENCOUNTER — Ambulatory Visit (INDEPENDENT_AMBULATORY_CARE_PROVIDER_SITE_OTHER): Payer: BC Managed Care – PPO | Admitting: Internal Medicine

## 2021-01-09 ENCOUNTER — Other Ambulatory Visit: Payer: Self-pay

## 2021-01-09 DIAGNOSIS — J4530 Mild persistent asthma, uncomplicated: Secondary | ICD-10-CM | POA: Diagnosis not present

## 2021-01-09 MED ORDER — DAPAGLIFLOZIN PROPANEDIOL 10 MG PO TABS
10.0000 mg | ORAL_TABLET | Freq: Every day | ORAL | 5 refills | Status: DC
Start: 2021-01-09 — End: 2021-01-17

## 2021-01-09 MED ORDER — EMPAGLIFLOZIN 10 MG PO TABS
10.0000 mg | ORAL_TABLET | Freq: Every day | ORAL | 5 refills | Status: DC
Start: 2021-01-09 — End: 2021-01-09

## 2021-01-09 NOTE — Progress Notes (Signed)
Stephen Whitaker, male    DOB: February 03, 1956     MRN: [id number]     Brief patient profile:  21 yobm never smoker dx as childhood asthma/ rhinitis with chronic symptoms preventing nl activities much better p age 65 then moved to college at [employer] age 68 more symptoms on some form of daily rx much better on advair and daily albterol  but still bothered by year round rhinitis and then when advair ran out breathing worse then changed to  symbicort and better on symbicort but still using saba bid and referred to pulmonary clinic 05/26/2018 by [name]    History of Present Illness  05/26/2018  Pulmonary / new pt eval / [name]  Chief Complaint  Patient presents with  . Pulmonary Consult    Referred by [name], NP for eval of bronchitis. Pt states he has had issues with bronchitis all of his life. He states he is not currently bothered by a cough or any trouble with breathing.   Dyspnea:  MMRC1 = can walk nl pace, flat grade, can't hurry or go uphills or steps s sob   Cough: none at present though some sense of pnds  Sleep: on side flat bed/ 1-2 pillows SABA use: ventolin avg   twice daily on or off advair or symbicort  rec Plan A = Automatic = Continue Symbicort 160 Take 2 puffs first thing in am and then another 2 puffs about 12 hours later.  Work on inhaler technique:    Plan B = Backup Only use your albuterol (VENTOLIN)   Increase Protonix to Take 30- 60 min before your first and last meals of the day  GERD diet           06/23/2018  f/u ov/[name] re: asthma/ rhinitis with marked atopy / difficulty affording meds despite insurance / did not bring in meds or formulary  Chief Complaint  Patient presents with  . Follow-up    Breathing is doing better. He is using his rescue inhaler 1-2 x per wk on average.   Dyspnea:  Not limited by breathing from desired activities   Cough: none but some sense of pnds day > noct with globus sensation  Sleeping: flat one pillow SABA use: as  above - much less since started symb 160 vs advair prior  rec Plan A = Automatic = Symbicort 160 Take 2 puffs first thing in am and then another 2 puffs about 12 hours later.  Plan B = Backup Only use your albuterol as a rescue medication  If you cannot access the symbicort then return here in 2 weeks with your drug  formulary in hand (this the information)   to see me or one of my nurse practioners   If happy we can see you in 3 months  Late add:  Change proair respiclick to hfa (inadvertantly called in the dpi form which is likely to irrritate his throat)    02/06/2019  f/u ov/Stephen Whitaker re: asthma/ rhinitis  Chief Complaint  Patient presents with  . Follow-up    Breathing is doing well. He is using his rescue inhaler on average once per month. +   Dyspnea:   Fine as long as on symb Cough: none/ still nasal drainage/congestion does not recall whether singulair helped  Sleeping: flat / one pillow SABA use: none while on symbicort  - issue is formulary restriction rec Plan A = Automatic = dulera 200 Take 2 puffs first thing in am and  then another 2 puffs about 12 hours later.  Singulair (montelukast) 10 mg one before sleep for nasal symptoms   Plan B = Backup Only use your albuterol inhaler as a rescue medication  Plan C = Crisis - only use your albuterol nebulizer if you first try Plan B and it fails to help > ok to use the nebulizer up to every 4 hours but if start needing it regularly call for immediate appointment We will call you with referral to allergy > never went      06/12/2019  f/u ov/Stephen Whitaker re: asthma/ rhinitis easily confused with details of care Chief Complaint  Patient presents with  . Follow-up    Breathing is doing well and he states he rarely uses his albuterol. No new co's.   Dyspnea:  Not limited by breathing from desired activities   Cough: nasal drainge/ congestion Sleeping: fine flat  SABA use: rare 02: none  rec Plan A = Automatic = dulera 200 Take 2 puffs  first thing in am and then another 2 puffs about 12 hours later.  Singulair (montelukast) 10 mg one before sleep for nasal symptoms   Plan B = Backup Only use your albuterol inhaler as a rescue medication   04/08/2020  f/u ov/Stephen Whitaker re: asthma / rhinitis with elevated IgE  maint on singulair/ symbicort 160  Chief Complaint  Patient presents with  . Follow-up    SOB unchanged   Dyspnea:  Bothered by heat does fine in [facility] Cough: no Sleeping: bed flat on side one pillow SABA use: once a day rarely 02: none  rec We will call to set up an appt to see Dr Bruna Potter group to evaluate your allergies >  Did not go  No change in medications     10/06/2020  f/u ov/Stephen Whitaker re: asthma/ rhinitis s/p ER 10/01/19 doxy/pred / symb 160  2bid/ singulair> only a little better  Chief Complaint  Patient presents with  . Follow-up    Non productive cough, wheezing, swelling/inflammation from waist down since took booster shot last week of Jan 2022   Dyspnea: doe x 50 ft  Cough: dry cough subjective wheeze  Sleeping: bed is flat and rotates one pillow  SABA use: every 6 hours  02: none  Covid status:   vax x 3 New problem is swelling waist down sym bilaterally on high dose amlodipine x sev months rec Plan A = Automatic = symbicort 160  Take 2 puffs first thing in am and then another 2 puffs about 12 hours later.  Singulair (montelukast) 10 mg one before sleep   Plan B = Backup Only use your albuterol inhaler as a rescue medication We will try to get back scheduled with Dr Neldon Mc  - ok to cancel me once you see Kozlow  See [name] NP for your swelling and in meantime start lasix 20 mg x 2 daily until better then one daily  Please schedule a follow up visit in 3 months but call sooner if needed  with all medications /inhalers/ solutions in hand so we can verify exactly what you are taking. This includes all medications from all doctors and over the counters   01/09/2021  f/u ov/Stephen Whitaker re: asthma / rx  symb160  Laurine Blazer with rhinitis/ atopic  Chief Complaint  Patient presents with  . Follow-up    Breathing is overall doing well. He is having some fluid retention in legs- started in Feb 2022. He is using his albuterol inhaler 2 x  per day on average.   Dyspnea:  Does ok shopping at [employer]/ [employer], uses [employer] paring  Cough: none  Sleeping: bed is flat / one pillow  SABA use: not needing albuterol  02: none  Covid status:   vax x 3    No obvious day to day or daytime variability or assoc excess/ purulent sputum or mucus plugs or hemoptysis or cp or chest tightness, subjective wheeze or overt sinus or hb symptoms.    Sleeping as above without nocturnal  or early am exacerbation  of respiratory  c/o's or need for noct saba. Also denies any obvious fluctuation of symptoms with weather or environmental changes or other aggravating or alleviating factors except as outlined above   No unusual exposure hx or h/o childhood pna or knowledge of premature birth.  Current Allergies, Complete Past Medical History, Past Surgical History, Family History, and Social History were reviewed in [employer] record.  ROS  The following are not active complaints unless bolded Hoarseness, sore throat, dysphagia, dental problems, itching, sneezing,  nasal congestion or discharge of excess mucus or purulent secretions, ear ache,   fever, chills, sweats, unintended wt loss or wt gain, classically pleuritic or exertional cp,  orthopnea pnd or arm/hand swelling  or leg swelling, presyncope, palpitations, abdominal pain, anorexia, nausea, vomiting, diarrhea  or change in bowel habits or change in bladder habits, change in stools or change in urine, dysuria, hematuria,  rash, arthralgias, visual complaints, headache, numbness, weakness or ataxia or problems with walking or coordination,  change in mood or  memory.        Current Meds  Medication Sig  . albuterol (VENTOLIN HFA) 108 (90 Base)  MCG/ACT inhaler Inhale 2 puffs into the lungs every 4 (four) hours as needed for wheezing or shortness of breath.  . allopurinol (ZYLOPRIM) 300 MG tablet Take 1 tablet (300 mg total) by mouth daily.  Marland Kitchen amLODipine (NORVASC) 10 MG tablet TAKE 1 TABLET BY MOUTH ONCE DAILY **  MUST  KEEP  UPCOMING  OFFICE  VISIT  FOR  REFILLS**  . aspirin EC 81 MG tablet Take 81 mg by mouth daily. Swallow whole.  Marland Kitchen Bioflavonoid Products (ESTER-C) TABS Take 1 tablet by mouth daily.  . Blood Pressure Monitoring DEVI 1 Units by Does not apply route daily. Measure blood pressure once a day  . budesonide-formoterol (SYMBICORT) 160-4.5 MCG/ACT inhaler USE 2 INHALATIONS TWICE A DAY  . fluticasone (FLONASE) 50 MCG/ACT nasal spray Place 1 spray into both nostrils 2 (two) times daily.  . furosemide (LASIX) 80 MG tablet Take 1 tablet (80 mg total) by mouth daily.  Marland Kitchen loratadine (CLARITIN) 10 MG tablet Take 10 mg by mouth daily.  . Magnesium Oxide 400 MG CAPS Take 1 capsule (400 mg total) by mouth daily.  . meloxicam (MOBIC) 7.5 MG tablet Take 1 tablet (7.5 mg total) by mouth daily.  . metoprolol succinate (TOPROL XL) 25 MG 24 hr tablet Take 1 tablet (25 mg total) by mouth daily.  . montelukast (SINGULAIR) 10 MG tablet TAKE 1 TABLET AT BEDTIME  . pantoprazole (PROTONIX) 40 MG tablet Take 40 mg by mouth 2 (two) times daily. Take 1 tab AM, 1 tab pm  . SPIRULINA PO Take 1 capsule by mouth 3 (three) times daily.                   Objective:    01/09/2021        209 10/06/2020  225   04/08/2020       194  06/12/2019      195  02/06/2019       198   06/23/18 200 lb 12.8 oz (91.1 kg)  06/23/18 209 lb (94.8 kg)  05/26/18 196 lb 9.6 oz (89.2 kg)    Vital signs reviewed  01/09/2021  - Note at rest 02 sats  98% on RA   General appearance:    amb somber bm nad     HEENT : pt wearing mask not removed for exam due to covid -19 concerns.    NECK :  without JVD/Nodes/TM/ nl carotid upstrokes bilaterally   LUNGS: no acc  muscle use,  Nl contour chest which is clear to A and P bilaterally without cough on insp or exp maneuvers   CV:  RRR  no s3 or murmur or increase in P2, and tace bilateral ankle edema   ABD:  soft and nontender with nl inspiratory excursion in the supine position. No bruits or organomegaly appreciated, bowel sounds nl  MS:  Nl gait/ ext warm without deformities, calf tenderness, cyanosis or clubbing No obvious joint restrictions   SKIN: warm and dry without lesions    NEURO:  alert, approp, nl sensorium with  no motor or cerebellar deficits apparent.             Assessment

## 2021-01-09 NOTE — Assessment & Plan Note (Signed)
Onset in childhood Allergy profile 05/26/2018 >  Eos 0.1/  IgE  905  RAST pan pos - Spirometry 05/26/2018  FEV1 2.5 (75%)  Ratio 77 min curvature p am symb 160 x2 puffs  - 05/26/2018   continue symbicort 160 / leave off singulair as says can't afford it on [employer] plan/ submitted [place] and [place] paperwork for symb - 02/06/2019   symbicort 160 (or dulera 200 depending on formulary)  added singulair  Trial - referred to allergy 02/06/2019  "could not connect" - 06/12/2019  After extensive coaching inhaler device,  effectiveness =    95% - 06/12/2019 add singulair for nasal symptoms and blow symbicort out thru the nose  - 10/06/2020 referred again to allergy   Despite noted atopy, at present All goals of chronic asthma control met including optimal function and elimination of symptoms with minimal need for rescue therapy.  Contingencies discussed in full including contacting this office immediately if not controlling the symptoms using the rule of two's.     F/u as per previous > allergy then pulmonary f/u prn           Each maintenance medication was reviewed in detail including emphasizing most importantly the difference between maintenance and prns and under what circumstances the prns are to be triggered using an action plan format where appropriate.  Total time for H and P, chart review, counseling, reviewing hfa device(s) and generating customized AVS unique to this office visit / same day charting = 20 min

## 2021-01-09 NOTE — Patient Instructions (Signed)
No change in medications   We will try again to refer you to Dr Bruna Potter practice in [place]   Once you've establisjed with allergy they can do the refills

## 2021-01-09 NOTE — Telephone Encounter (Addendum)
Farxiga 10mg  daily prior auth approved through 01/09/21. Rx sent to pharmacy, copay is $0. Pt is aware to start on therapy and will keep follow up appt 6/7 with PharmD for further optimization of CHF medications.

## 2021-01-10 ENCOUNTER — Telehealth: Payer: Self-pay | Admitting: Internal Medicine

## 2021-01-10 NOTE — Telephone Encounter (Signed)
Called and spoke with Patient.  Patient made aware of 01/30/21 at 2pm OV scheduled with [facility], and if he misses this OV, he will not be scheduled with them again, because of no show, cancellations in the past.  Patient stated understanding.   I called [facility].  They are aware that Patient has been notified and aware of cancellation, no show consequences.  Nothing further at this time.

## 2021-01-11 ENCOUNTER — Other Ambulatory Visit: Payer: BC Managed Care – PPO | Admitting: *Deleted

## 2021-01-11 ENCOUNTER — Other Ambulatory Visit: Payer: Self-pay

## 2021-01-11 DIAGNOSIS — I5023 Acute on chronic systolic (congestive) heart failure: Secondary | ICD-10-CM

## 2021-01-11 LAB — BASIC METABOLIC PANEL
BUN/Creatinine Ratio: 17 (ref 10–24)
BUN: 28 mg/dL — ABNORMAL HIGH (ref 8–27)
CO2: 22 mmol/L (ref 20–29)
Calcium: 8.8 mg/dL (ref 8.6–10.2)
Chloride: 97 mmol/L (ref 96–106)
Creatinine, Ser: 1.63 mg/dL — ABNORMAL HIGH (ref 0.76–1.27)
Glucose: 87 mg/dL (ref 65–99)
Potassium: 4.5 mmol/L (ref 3.5–5.2)
Sodium: 136 mmol/L (ref 134–144)
eGFR: 46 mL/min/{1.73_m2} — ABNORMAL LOW (ref >59)

## 2021-01-17 ENCOUNTER — Other Ambulatory Visit: Payer: Self-pay

## 2021-01-17 ENCOUNTER — Telehealth: Payer: Self-pay | Admitting: Internal Medicine

## 2021-01-17 ENCOUNTER — Telehealth: Payer: Self-pay

## 2021-01-17 ENCOUNTER — Ambulatory Visit: Payer: BC Managed Care – PPO | Admitting: Nurse Practitioner

## 2021-01-17 DIAGNOSIS — I5023 Acute on chronic systolic (congestive) heart failure: Secondary | ICD-10-CM

## 2021-01-17 MED ORDER — FUROSEMIDE 80 MG PO TABS: 40.0000 mg | ORAL_TABLET | Freq: Every day | ORAL | 1 refills | Status: DC

## 2021-01-17 NOTE — Telephone Encounter (Addendum)
Called patient and notified him of results and MD recommendations.  Pt driving during call so he will call back to schedule lab appointment. Patient called back in he reports that since increasing furosemide to 80 mg PO QD he hasn't been feeling well.  He reports that his UOP has decreased.  He has N/D and feels very fatigued.  I spoke with Dr. Gasper Sells he ordered pt to decrease furosemide to 40 mg PO  QD, recheck labs during pharm d OV on 6/7 and do not start taking [place].  Pt verbalizes understanding.

## 2021-01-17 NOTE — Telephone Encounter (Signed)
Please see result note from today 01/17/21.

## 2021-01-17 NOTE — Telephone Encounter (Signed)
-----   Message from [name], MD sent at 01/15/2021 10:11 AM EDT ----- Results: Increase in creatinine and BUN, K has improved on combination SGLT2i and Lasix Plan: Would repeat BMP and BNP in 2 weeks; I suspect that he is still profoundly volume overloaded, and worry that backing on his GDMT would put him in the hospital  [name], MD

## 2021-01-17 NOTE — Telephone Encounter (Signed)
PT is returning a call in regards to lab results and getting a appt scheduled

## 2021-01-23 ENCOUNTER — Ambulatory Visit (INDEPENDENT_AMBULATORY_CARE_PROVIDER_SITE_OTHER): Payer: BC Managed Care – PPO | Admitting: Physician Assistant

## 2021-01-23 ENCOUNTER — Other Ambulatory Visit: Payer: Self-pay

## 2021-01-23 ENCOUNTER — Encounter: Payer: Self-pay | Admitting: Orthopedic Surgery

## 2021-01-23 DIAGNOSIS — L97521 Non-pressure chronic ulcer of other part of left foot limited to breakdown of skin: Secondary | ICD-10-CM

## 2021-01-23 DIAGNOSIS — Z13220 Encounter for screening for lipoid disorders: Secondary | ICD-10-CM

## 2021-01-23 NOTE — Progress Notes (Signed)
Office Visit Note   Patient: Stephen Whitaker           Date of Birth: 1956/04/16           MRN: [id number] Visit Date: 01/23/2021              Requested by: [name], NP [id number] [place],  [place] [address] PCP: [name], NP  Chief Complaint  Patient presents with  . Left Foot - Follow-up      HPI: Patient presents in follow-up for his left foot ulcer.  He has a history of a medial plantar ulcer from Charcot neuropathy and ulcer beneath the great toe.  He has had offloading padding placed in his shoe  Assessment & Plan: Visit Diagnoses: No diagnosis found.  Plan: New padding was placed today he will follow-up in 3 weeks.  The medial side of his shoe is breaking down and I have encouraged him if possible to get new shoes that are supportive Follow-Up Instructions: No follow-ups on file.   Ortho Exam  Patient is alert, oriented, no adenopathy, well-dressed, normal affect, normal respiratory effort. Left foot beneath the great toe there is a Wagner grade 1 ulcer approximately 2 cm in diameter there is no surrounding cellulitis noted minimal drainage he does have quite a bit of skin maceration and wetness noted about the wound.  After verbal consent was obtained this was bleeding to healthy surfaces and touch with a silver nitrate stick He also has callusing over the medial side of his foot with a small 2 mm ulcer.  Callus was debrided to soft surfaces again there is no surrounding erythema no foul odor no ascending cellulitis new pads were placed in his shoes in the same place the previous pads were  Imaging: No results found. No images are attached to the encounter.  Labs: Lab Results  Component Value Date   HGBA1C 5.6 08/10/2014   ESRSEDRATE 27 (H) 07/05/2015   ESRSEDRATE 80 (H) 01/21/2015   CRP 1.8 (H) 07/05/2015   LABURIC 5.9 07/28/2020   LABURIC 5.4 02/19/2018   LABURIC 6.0 08/10/2014   REPTSTATUS 01/22/2015 FINAL 01/21/2015   CULT NO  GROWTH Performed at [facility]  01/21/2015     Lab Results  Component Value Date   ALBUMIN 3.9 06/26/2019   ALBUMIN 3.9 10/22/2018   ALBUMIN 4.1 06/23/2018    Lab Results  Component Value Date   MG 1.5 (L) 10/24/2020   MG 1.8 01/21/2015   Lab Results  Component Value Date   VD25OH 32.4 01/23/2017    No results found for: PREALBUMIN CBC EXTENDED Latest Ref Rng & Units 12/12/2020 12/12/2020 12/12/2020  WBC 3.4 - 10.8 x10E3/uL - - -  RBC 4.14 - 5.80 x10E6/uL - - -  HGB 13.0 - 17.0 g/dL 12.2(L) 12.6(L) 12.6(L)  HCT 39.0 - 52.0 % 36.0(L) 37.0(L) 37.0(L)  PLT 150 - 450 x10E3/uL - - -  NEUTROABS 1.4 - 7.7 K/uL - - -  LYMPHSABS 0.7 - 4.0 K/uL - - -     There is no height or weight on file to calculate BMI.  Orders:  No orders of the defined types were placed in this encounter.  No orders of the defined types were placed in this encounter.    Procedures: No procedures performed  Clinical Data: No additional findings.  ROS:  All other systems negative, except as noted in the HPI. Review of Systems  Objective: Vital Signs: There were  no vitals taken for this visit.  Specialty Comments:  No specialty comments available.  PMFS History: Patient Active Problem List   Diagnosis Date Noted  . LVH (left ventricular hypertrophy) 01/04/2021  . Acute on chronic systolic heart failure ([place]) 12/08/2020  . Heart failure, type unknown ([place]) 10/14/2020  . Nonspecific abnormal electrocardiogram (ECG) (EKG) 10/14/2020  . Atrial tachycardia ([place]) 10/14/2020  . Non-pressure chronic ulcer of other part of left foot limited to breakdown of skin ([place]) 10/12/2020  . Leg swelling 10/06/2020  . DOE (dyspnea on exertion) 10/06/2020  . Thrombocytopenia ([place]) 06/26/2019  . Acquired absence of other right toe(s) ([place]) 02/19/2018  . Boil of buttock 10/24/2016  . Amputated toe of left foot ([place]) 10/19/2016  . Amputated toe, right ([place]) 06/13/2016  . Chronic bronchitis ([place])  11/24/2015  . Other pancytopenia ([place]) 01/24/2015  . Chronic pancreatitis ([place]) 01/24/2015  . Blood poisoning   . Neutropenia ([place])   . Iron deficiency anemia due to chronic blood loss   . Leukopenia   . Sepsis ([place]) 01/21/2015  . Leucopenia 01/21/2015  . Anemia 01/21/2015  . Syncope 01/21/2015  . Diarrhea 01/21/2015  . Hypokalemia 01/21/2015  . Loss of consciousness ([place])   . Eczema 10/21/2014  . Essential hypertension 10/21/2014  . Vasovagal syncope 10/21/2014  . Bilateral conjunctivitis 08/10/2014  . Other chronic pancreatitis ([place]) 08/10/2014  . COLD (chronic obstructive lung disease) ([place]) 08/10/2014  . Chronic ethmoidal sinusitis 08/10/2014  . Bronchitis 10/17/2010  . PLANTAR FASCIITIS, LEFT 10/17/2010  . OTHER NEUTROPENIA 04/21/2010  . CALLUS, TOE 03/15/2010  . LEUKOPENIA, MILD 02/20/2010  . LIVER FUNCTION TESTS, ABNORMAL, HX OF 02/16/2010  . DENTAL CARIES 02/15/2010  . ONYCHOMYCOSIS 07/05/2009  . ALLERGIC URTICARIA 07/05/2009  . PAIN IN LIMB 07/05/2009  . ERECTILE DYSFUNCTION 05/25/2008  . INFLUENZA 05/25/2008  . VIRAL INFECTION 10/01/2007  . HSV 05/02/2007  . CONDYLOMA ACUMINATUM 05/02/2007  . HYPERTENSION 05/02/2007  . ALLERGIC RHINITIS 05/02/2007  . Chronic asthma, mild persistent, uncomplicated 62/95/2841  . Gastroesophageal reflux disease without esophagitis 05/02/2007  . PANCREATITIS 05/02/2007   Past Medical History:  Diagnosis Date  . Abnormal liver function tests   . Arthritis   . Boil of buttock   . COPD (chronic obstructive pulmonary disease) (HCC)    Chronic Bronchitis.Advair inhaler   . Dyspnea   . GERD (gastroesophageal reflux disease)    takes Protonix daily  . Gout    takes Allopurinol daily  . Hemorrhoids   . Hypertension    takes HCTZ daily  . Hypomagnesemia   . Leg ulcer ([place])   . Mild mitral regurgitation   . Pancreatitis    10 years ago  . Pneumonia 2016  . Tricuspid regurgitation   . Tubular adenoma of colon     Family  History  Adopted: Yes  Problem Relation Age of Onset  . Cancer Sister 67       unknown origin     Past Surgical History:  Procedure Laterality Date  . AMPUTATION Left 07/14/2014   Procedure: 2nd Toe Amputation;  Surgeon: [name], MD;  Location: [facility];  Service: Orthopedics;  Laterality: Left;  . AMPUTATION Left 11/16/2015   Procedure: Left Great Toe Amputation at Metatarsophalangeal Joint;  Surgeon: [name], MD;  Location: [facility];  Service: Orthopedics;  Laterality: Left;  . AMPUTATION Right 06/13/2016   Procedure: RIGHT 2nd Toe Amputation;  Surgeon: [name], MD;  Location: [facility];  Service: Orthopedics;  Laterality: Right;  .  AMPUTATION Left 10/19/2016   Procedure: Amputation Left 3rd Toe;  Surgeon: [name], MD;  Location: [facility];  Service: Orthopedics;  Laterality: Left;  . RIGHT/LEFT HEART CATH AND CORONARY ANGIOGRAPHY N/A 12/12/2020   Procedure: RIGHT/LEFT HEART CATH AND CORONARY ANGIOGRAPHY;  Surgeon: Lorretta Harp, MD;  Location: [facility];  Service: Cardiovascular;  Laterality: N/A;  . TOE AMPUTATION Left 11/16/2015   left great toe   . TOE AMPUTATION Right 06/13/2016  . TONSILLECTOMY     Social History   Occupational History  . Not on file  Tobacco Use  . Smoking status: Never Smoker  . Smokeless tobacco: Never Used  Vaping Use  . Vaping Use: Never used  Substance and Sexual Activity  . Alcohol use: Yes    Alcohol/week: 0.0 standard drinks    Comment: rarely  . Drug use: No  . Sexual activity: Not Currently

## 2021-01-23 NOTE — Progress Notes (Signed)
Patient ID: Stephen Whitaker                 DOB: 03/19/1956                      MRN: [id number]     HPI: Stephen Whitaker is a 65 y.o. male referred by Dr. Gasper Sells to pharmacy clinic for HF medication management. PMH is significant for HF, HTN, COPD, GERD, moderate TR, gout, SVT, asthma, and chronic pancreatitis. Most recent LVEF 20-25% on 12/08/20 (previously 55-60% in 2016). Cardiac cath 12/12/20 showed clean coronary arteries with nonischemic cardiomyopathy. Pt last seen by Dr. Gasper Sells on 01/04/21. At that visit, BP was 128/80 and ECHO revealed an EF of 20-25%. Furosemide was increased to 80 mg daily, Farxiga 10 mg daily was initiated, metoprolol tartrate was switched to metoprolol succinate 25 mg daily. Reported goal is to start Entresto at next visit, discontinue amlodipine if needed to reduce risk of hypotension, and add an MRA in the future if electrolytes and renal function is stable.   BMP on 01/11/21 revealed elevated Scr 1.63 (previously 1.19 11/2020), eGFR 46, K 4.5 (history of hypokalemia), and Na 136. Dr. Gasper Sells recommended a repeat BMP and BNP in two weeks, and suspects pt is still profoundly volume overloaded. No medication changes were made. Then, during telephone encounter on 5/31, pt reported not feeling well since increase furosemide from 40 mg to 80 mg daily. Reported decreased urination, nausea/diarrhea, and fatigue. Dr. Gasper Sells recommended to decreased furosemide to 40 mg daily, recheck BMP and to not start Farxiga.  Patient presents today in good spirits. Reports having metoprolol tartrate 25 mg tablets and taking 12.5 mg BID instead of the prescribed metoprolol succinate 25 mg daily. Reports medication adherence with amlodipine 10 mg daily and recently reduced dose of furosemide 40 mg daily. Reports never starting Farxiga 10 mg daily. Reports not taking morning CHF/HTN medication today prior to visit. Reports diarrhea and decreased urination with furosemide  80 mg daily.   Symptomatically, he reports difficulty breathing (has to take deep breaths in/out) which has been occurring for ~1 week.  Reports SOB when completing ADLs and moving real slow due to difficulty breathing. Reports orthopnea, fatigue, decreased muscle mass, smaller arms, and upper body weakness (getting up from a seated position takes more effort). Reports body weakness has been occurring for >1 month. Reports LE edema and dry mouth. Reports some gait imbalance due to foot issues. Denies dizziness and lightheadedness. Denies chest pain and palpitations. Reports not checking weight at home. Reports some nausea/vomiting and reduced appetite. Reports adherence to a low-salt diet. Reports home SBP have improved to 128-130s.  Current CHF/HTN meds:  metoprolol succinate 65m daily (still taking metoprolol tartrate 12.5 mg BID) amlodipine 10 mg daily furosemide 40 mg daily  BP goal: <130/80 mmHg  Family History: Cancer (age of onset: 426 in his sister. He was adopted.  Social History: denies tobacco use  Diet: will address at next visit  Exercise: will address at next visit  Home SBP readings: 128-130s  Wt Readings from Last 3 Encounters:  01/24/21 213 lb (96.6 kg)  01/09/21 209 lb 12.8 oz (95.2 kg)  01/04/21 206 lb (93.4 kg)   BP Readings from Last 3 Encounters:  01/24/21 132/80  01/09/21 136/84  01/04/21 128/80   Pulse Readings from Last 3 Encounters:  01/24/21 93  01/09/21 (!) 106  01/04/21 61    Renal function: Estimated Creatinine Clearance: 52.6 mL/min (A) (by C-G  formula based on SCr of 1.63 mg/dL (H)).  Past Medical History:  Diagnosis Date  . Abnormal liver function tests   . Arthritis   . Boil of buttock   . COPD (chronic obstructive pulmonary disease) (HCC)    Chronic Bronchitis.Advair inhaler   . Dyspnea   . GERD (gastroesophageal reflux disease)    takes Protonix daily  . Gout    takes Allopurinol daily  . Hemorrhoids   . Hypertension    takes  HCTZ daily  . Hypomagnesemia   . Leg ulcer ([place])   . Mild mitral regurgitation   . Pancreatitis    10 years ago  . Pneumonia 2016  . Tricuspid regurgitation   . Tubular adenoma of colon     Current Outpatient Medications on File Prior to Visit  Medication Sig Dispense Refill  . albuterol (VENTOLIN HFA) 108 (90 Base) MCG/ACT inhaler Inhale 2 puffs into the lungs every 4 (four) hours as needed for wheezing or shortness of breath. 54 g 1  . allopurinol (ZYLOPRIM) 300 MG tablet Take 1 tablet (300 mg total) by mouth daily. 30 tablet 0  . amLODipine (NORVASC) 10 MG tablet TAKE 1 TABLET BY MOUTH ONCE DAILY **  MUST  KEEP  UPCOMING  OFFICE  VISIT  FOR  REFILLS** 30 tablet 1  . aspirin EC 81 MG tablet Take 81 mg by mouth daily. Swallow whole.    Marland Kitchen Bioflavonoid Products (ESTER-C) TABS Take 1 tablet by mouth daily.    . Blood Pressure Monitoring DEVI 1 Units by Does not apply route daily. Measure blood pressure once a day 1 Units 0  . budesonide-formoterol (SYMBICORT) 160-4.5 MCG/ACT inhaler USE 2 INHALATIONS TWICE A DAY 30.6 g 3  . Colchicine 0.6 MG CAPS Take 1 capsule by mouth daily for 6 doses. ONLY AS NEEDED FOR GOUT FLARE 6 capsule 0  . fluticasone (FLONASE) 50 MCG/ACT nasal spray Place 1 spray into both nostrils 2 (two) times daily. 49 g 11  . furosemide (LASIX) 80 MG tablet Take 0.5 tablets (40 mg total) by mouth daily. 45 tablet 1  . loratadine (CLARITIN) 10 MG tablet Take 10 mg by mouth daily.    . Magnesium Oxide 400 MG CAPS Take 1 capsule (400 mg total) by mouth daily. 90 capsule 3  . meloxicam (MOBIC) 7.5 MG tablet Take 1 tablet (7.5 mg total) by mouth daily. 30 tablet 0  . metoprolol succinate (TOPROL XL) 25 MG 24 hr tablet Take 1 tablet (25 mg total) by mouth daily. 90 tablet 3  . montelukast (SINGULAIR) 10 MG tablet TAKE 1 TABLET AT BEDTIME 90 tablet 3  . pantoprazole (PROTONIX) 40 MG tablet Take 40 mg by mouth 2 (two) times daily. Take 1 tab AM, 1 tab pm    . SPIRULINA PO Take 1  capsule by mouth 3 (three) times daily.     No current facility-administered medications on file prior to visit.    Allergies  Allergen Reactions  . Ciprofloxacin Rash and Other (See Comments)    SYNCOPE   . Shellfish Allergy Anaphylaxis  . Sulfa Antibiotics Other (See Comments)    SYNCOPE DIZZINESS      Assessment/Plan:  1. CHF - Clinic BP near goal <130/80 mmHg. Medication adherence appears fair. Discussed with Dr. Marlou Porch regarding patient's symptoms, possibly related to volume overload (BMP and BNP collected today and in process). Agreed to start Entresto 24-26 mg twice daily and will discontinue amlodipine. Encouraged patient to start prescribed metoprolol succinate 25 mg  daily and discontinue metoprolol tartrate to reduce pill burden. Patient verbalized agreement to medication changes. Will plan to change furosemide to torsemide - dose to be determined pending BMET results (if stable, will change to torsemide 26m daily, otherwise if SCr has remained elevated, will change to 269mdaily) - discussed with Dr ChGasper Sellsn the afternoon. Encouraged patient to start checking daily weight at home and monitor signs/symptoms of volume overload. Patient verbalized understanding. Scheduled follow-up BMP in 2 weeks and follow-up visit in 3 weeks - will plan to start [place] that time (prior auth already approved).  Stephen MonacoPharmD, [id number]. [id number] [id number]: [phone]: [phone]

## 2021-01-24 ENCOUNTER — Encounter: Payer: Self-pay | Admitting: Pharmacist

## 2021-01-24 ENCOUNTER — Other Ambulatory Visit: Payer: BC Managed Care – PPO

## 2021-01-24 ENCOUNTER — Ambulatory Visit (INDEPENDENT_AMBULATORY_CARE_PROVIDER_SITE_OTHER): Payer: BC Managed Care – PPO | Admitting: Pharmacist

## 2021-01-24 VITALS — BP 132/80 | HR 93 | Wt 213.0 lb

## 2021-01-24 DIAGNOSIS — I509 Heart failure, unspecified: Secondary | ICD-10-CM

## 2021-01-24 DIAGNOSIS — R0602 Shortness of breath: Secondary | ICD-10-CM | POA: Diagnosis not present

## 2021-01-24 DIAGNOSIS — I5023 Acute on chronic systolic (congestive) heart failure: Secondary | ICD-10-CM

## 2021-01-24 LAB — BASIC METABOLIC PANEL
BUN/Creatinine Ratio: 15 (ref 10–24)
BUN: 20 mg/dL (ref 8–27)
CO2: 20 mmol/L (ref 20–29)
Calcium: 8.7 mg/dL (ref 8.6–10.2)
Chloride: 103 mmol/L (ref 96–106)
Creatinine, Ser: 1.3 mg/dL — ABNORMAL HIGH (ref 0.76–1.27)
Glucose: 86 mg/dL (ref 65–99)
Potassium: 4.5 mmol/L (ref 3.5–5.2)
Sodium: 139 mmol/L (ref 134–144)
eGFR: 61 mL/min/{1.73_m2} (ref >59)

## 2021-01-24 LAB — PRO B NATRIURETIC PEPTIDE: NT-Pro BNP: 22081 pg/mL — ABNORMAL HIGH (ref 0–376)

## 2021-01-24 MED ORDER — ENTRESTO 24-26 MG PO TABS: 1.0000 | ORAL_TABLET | Freq: Two times a day (BID) | ORAL | 2 refills | Status: DC

## 2021-01-24 NOTE — Patient Instructions (Signed)
It was nice to see you today!  Medication Changes: Begin Entresto 24-26 mg twice daily (1 tablet in the morning and 1 tablet in the evening)  Start taking metoprolol succinate (Toprol) 25 mg daily (1 tablet)  Stop taking amlodipine  Stop taking metoprolol tartrate (Lopressor)   Monitor blood pressure at home daily and keep a log (on your phone or piece of paper) to bring with you to your next visit. Write down date, time, blood pressure and pulse.  Keep up the good work with diet and exercise. Aim for a diet full of vegetables, fruit and lean meats (chicken, [place], fish). Try to limit salt intake by eating fresh or frozen vegetables (instead of canned), rinse canned vegetables prior to cooking and do not add any additional salt to meals.

## 2021-01-25 ENCOUNTER — Telehealth: Payer: Self-pay | Admitting: Pharmacist

## 2021-01-25 ENCOUNTER — Other Ambulatory Visit: Payer: Self-pay | Admitting: Physician Assistant

## 2021-01-25 ENCOUNTER — Other Ambulatory Visit: Payer: Self-pay | Admitting: Internal Medicine

## 2021-01-25 DIAGNOSIS — M2550 Pain in unspecified joint: Secondary | ICD-10-CM

## 2021-01-25 MED ORDER — TORSEMIDE 20 MG PO TABS: 40.0000 mg | ORAL_TABLET | Freq: Every day | ORAL | 5 refills | Status: DC

## 2021-01-25 NOTE — Telephone Encounter (Signed)
Stephen Whitaker, RPH-CPP  01/25/2021 9:31 AM EDT Back to Top     Pt aware to stop furosemide 40mg  daily and start torsemide 40mg  daily for better diuresis. He will keep lab appt on 6/21 and PharmD appt on 6/28 for continued CHF med optimization.   Stephen Lean, MD  01/24/2021 6:53 PM EDT      Results: Worsening NT-BNP, improved creatinine Plan: We will have to make the transition to the higher dose torsemide 40 mg that we discussed and follow up labs on his creatinine on the [facility]  Stephen Lean, MD

## 2021-01-30 ENCOUNTER — Ambulatory Visit (INDEPENDENT_AMBULATORY_CARE_PROVIDER_SITE_OTHER): Payer: BC Managed Care – PPO | Admitting: Allergy

## 2021-01-30 ENCOUNTER — Encounter: Payer: Self-pay | Admitting: Allergy

## 2021-01-30 ENCOUNTER — Other Ambulatory Visit: Payer: Self-pay

## 2021-01-30 VITALS — BP 126/78 | HR 104 | Temp 98.6°F | Resp 14 | Ht 74.5 in | Wt 204.0 lb

## 2021-01-30 DIAGNOSIS — H1013 Acute atopic conjunctivitis, bilateral: Secondary | ICD-10-CM

## 2021-01-30 DIAGNOSIS — T781XXD Other adverse food reactions, not elsewhere classified, subsequent encounter: Secondary | ICD-10-CM | POA: Insufficient documentation

## 2021-01-30 DIAGNOSIS — J3089 Other allergic rhinitis: Secondary | ICD-10-CM | POA: Diagnosis not present

## 2021-01-30 DIAGNOSIS — J45909 Unspecified asthma, uncomplicated: Secondary | ICD-10-CM

## 2021-01-30 MED ORDER — AZELASTINE HCL 0.1 % NA SOLN: 1.0000 | Freq: Two times a day (BID) | NASAL | 5 refills | Status: DC | PRN

## 2021-01-30 MED ORDER — EPINEPHRINE 0.3 MG/0.3ML IJ SOAJ: 0.3000 mg | INTRAMUSCULAR | 1 refills | Status: DC | PRN

## 2021-01-30 MED ORDER — OLOPATADINE HCL 0.2 % OP SOLN: 1.0000 [drp] | Freq: Every day | OPHTHALMIC | 5 refills | Status: DC | PRN

## 2021-01-30 NOTE — Progress Notes (Signed)
New Patient Note  RE: [facility] MRN: [id number] DOB: 27-Mar-1956 Date of Office Visit: 01/30/2021  Consult requested by: [name], NP Primary care provider: [name], NP  Chief Complaint: Bronchitis (States he has chronic bronchitis.) and Allergic Rhinitis  (Constant nasal drainage. Sneezing and watery eyes when he is exposed to smoke, strong scents, and pets. )  History of Present Illness: I had the pleasure of seeing Khani Paino for initial evaluation at the [facility] on 01/30/2021. He is a 65 y.o. male, who is referred here by Dr. Melvyn Novas for the evaluation of allergic rhinitis and asthma.  Rhinitis: He reports symptoms of sneezing, rhinorrhea, PND, watery/itchy eyes, nasal congestion, itchy nose. Symptoms have been going on for 50 years. The symptoms are present all year around. Other triggers include exposure to cigarette smoke, fingernail polish, strong odors. Anosmia: no. Headache: no. He has used Claritin, Flonase, Singulair with some improvement in symptoms. Sinus infections: denies. Previous work up includes: bloodwork in 2019 was positive to dust mites, mold, cat, dog, cockroach, trees, grass, ragweed. IgE 905. Previous ENT evaluation: no. Previous sinus imaging: no. History of nasal polyps: no. Last eye exam: not recently. History of reflux: yes and takes pantoprazole twice a day.   Respiratory:  He reports symptoms of chest tightness, shortness of breath, coughing, wheezing for 30+ years. Current medications include Symbicort 180mcg 2 puffs twice a day and albuterol prn which help. He reports not using aerochamber with inhalers. He tried the following inhalers: Advair. Main triggers are strong scents. In the last month, frequency of symptoms: depends. Frequency of SABA use: <1x/week. Interference with physical activity: yes. In the last 12 months, emergency room visits/urgent care visits/doctor office visits or hospitalizations due  to respiratory issues: 1-2. In the last 12 months, oral steroids courses: once per year. Lifetime history of hospitalization for respiratory issues: no. Prior intubations: no. History of pneumonia: yes. He was evaluated by pulmonologist in the past. Smoking exposure: denies. Up to date with flu vaccine: no. Up to date with pneumonia vaccine: yes. Up to date with COVID-19 vaccine: yes. Prior Covid-19 infection: no.  01/09/2021 pulmonology OV: "71 yobm never smoker dx as childhood asthma/ rhinitis with chronic symptoms preventing nl activities much better p age 18 then moved to college at [employer] age 73 more symptoms on some form of daily rx much better on advair and daily albterol  but still bothered by year round rhinitis and then when advair ran out breathing worse then changed to  symbicort and better on symbicort but still using saba bid and referred to pulmonary clinic 05/26/2018 by [name]"  Patient follows with cardiology, pulmonology.   Assessment and Plan: Dshaun is a 65 y.o. male with: Other allergic rhinitis Perennial rhinoconjunctivitis symptoms for 50 years.  Tried Claritin, Flonase and Singulair with some benefit.  2019 blood work was positive to dust mites, mold, cat, dog, cockroach, trees, grass, ragweed.  IgE 905. Today's skin testing showed: Positive to grass, weed pollen, ragweed, trees, mold, dust mites, cat, cockroach. Start environmental control measures as below. Continue Singulair (montelukast) 10mg  daily at night. Use over the counter antihistamines such as Zyrtec (cetirizine), Claritin (loratadine), Allegra (fexofenadine), or Xyzal (levocetirizine) daily as needed. May take twice a day during allergy flares. May switch antihistamines every few months. Use Flonase (fluticasone) nasal spray 1 spray per nostril twice a day as needed for nasal congestion.  Use azelastine nasal spray 1-2 sprays per nostril twice  a day as needed for runny nose/drainage. Nasal saline spray (i.e.,  Simply Saline) or nasal saline lavage (i.e., NeilMed) is recommended as needed and prior to medicated nasal sprays. Use olopatadine eye drops 0.2% once a day as needed for itchy/watery eyes. Consider allergy injections for long term control if above medications do not help the symptoms - handout given.   Allergic conjunctivitis of both eyes See assessment and plan as above.  Asthma Diagnosed with asthma over 30+ years ago. Followed by pulmonology. Usually requires one course of prednisone and antibiotics per year for "bronchitis" flare. Today's spirometry showed: Some restriction with no improvement in FEV1 post bronchodilator treatment.  Clinically feeling slightly improved. Daily controller medication(s): continue Symbicort 186mcg 2 puffs twice a day with spacer and rinse mouth afterwards. May use albuterol rescue inhaler 2 puffs every 4 to 6 hours as needed for shortness of breath, chest tightness, coughing, and wheezing. May use albuterol rescue inhaler 2 puffs 5 to 15 minutes prior to strenuous physical activities. Monitor frequency of use.  Continue to follow up with pulmonology and their recommendations.   Other adverse food reactions, not elsewhere classified, subsequent encounter Shrimp caused vomiting; lobster caused vomiting, hand swelling and lost consciousness; oysters cause vomiting. Tolerates finned fish. No prior work up and no Epipen on hand.  Today's skin testing showed: Positive to shellfish and borderline to mollusks.  Continue strict avoidance of shellfish and mollusks (oysters, scallops, mussels) I have prescribed epinephrine injectable and demonstrated proper use. For mild symptoms you can take over the counter antihistamines such as Benadryl and monitor symptoms closely. If symptoms worsen or if you have severe symptoms including breathing issues, throat closure, significant swelling, whole body hives, severe diarrhea and vomiting, lightheadedness then inject epinephrine and  seek immediate medical care afterwards. Action plan given.  Return in about 3 months (around 05/02/2021).  Meds ordered this encounter  Medications   EPINEPHrine (AUVI-Q) 0.3 mg/0.3 mL IJ SOAJ injection    Sig: Inject 0.3 mg into the muscle as needed for anaphylaxis.    Dispense:  1 each    Refill:  1    C: [phone]   azelastine (ASTELIN) 0.1 % nasal spray    Sig: Place 1-2 sprays into both nostrils 2 (two) times daily as needed (nasal drainage). Use in each nostril as directed    Dispense:  30 mL    Refill:  5   Olopatadine HCl 0.2 % SOLN    Sig: Apply 1 drop to eye daily as needed (itchy/watery eyes).    Dispense:  2.5 mL    Refill:  5    Lab Orders  No laboratory test(s) ordered today    Other allergy screening: Food allergy: yes Shrimp causes vomiting.  Lobster causes vomiting, lost of consciousness, hand swelling. Oysters cause vomiting. Avoiding mollusks.  Tolerates finned fish.  No prior allergy testing and no Epipen on hand.   Medication allergy: yes Hymenoptera allergy: no Urticaria: no Eczema:no History of recurrent infections suggestive of immunodeficency: no  Diagnostics: Spirometry:  Tracings reviewed. His effort: It was hard to get consistent efforts and there is a question as to whether this reflects a maximal maneuver. FVC: 2.91L FEV1: 2.01L, 55% predicted FEV1/FVC ratio: 69% Interpretation: Spirometry consistent with possible restrictive disease with no improvement in FEV1 post bronchodilator treatment. Clinically feeling slightly improved.   Please see scanned spirometry results for details.  Skin Testing: Environmental allergy panel and select foods. Positive to grass, weed pollen, ragweed, trees, mold, dust mites,  cat, cockroach. Positive to shellfish and borderline to mollusks.  Results discussed with patient/family.  Airborne Adult Perc - 01/30/21 1454     Time Antigen Placed 1454    Allergen Manufacturer Lavella Hammock    Location Back     Number of Test 59    1. Control-Buffer 50% Glycerol Negative    2. Control-Histamine 1 mg/ml 2+    3. Albumin saline Negative    4. Aldine 2+    5. [place] 2+    6. Johnson 2+    7. Kentucky Blue 2+    8. Meadow Fescue 2+    9. Perennial Rye 2+    10. Sweet Vernal 2+    11. Timothy --   +/-   12. Cocklebur Negative    13. Burweed Marshelder 2+    14. Ragweed, short Negative    15. Ragweed, Giant Negative    16. Plantain,  English Negative    17. Lamb's Quarters Negative    18. Sheep Sorrell Negative    19. Rough Pigweed Negative    20. Marsh Elder, Rough Negative    21. Mugwort, Common Negative    22. Ash mix Negative    23. Birch mix 2+    24. Beech American Negative    25. Box, Elder 2+    26. Cedar, red Negative    27. Cottonwood, [place] Negative    28. Elm mix Negative    29. Hickory Negative    30. Maple mix Negative    31. Oak, [place] mix Negative    32. Pecan Pollen 2+    33. Pine mix Negative    34. Sycamore Eastern Negative    35. Raceland, Black Pollen 2+    36. Alternaria alternata 2+    37. Cladosporium Herbarum 2+    38. Aspergillus mix Negative    39. Penicillium mix Negative    40. Bipolaris sorokiniana (Helminthosporium) 2+    41. Drechslera spicifera (Curvularia) 2+    42. Mucor plumbeus Negative    43. Fusarium moniliforme --   +/-   44. Aureobasidium pullulans (pullulara) 2+    45. Rhizopus oryzae Negative    46. Botrytis cinera Negative    47. Epicoccum nigrum Negative    48. Phoma betae Negative    49. Candida Albicans Negative    50. Trichophyton mentagrophytes Negative    51. Mite, D Farinae  5,000 AU/ml 2+    52. Mite, D Pteronyssinus  5,000 AU/ml 2+    53. Cat Hair 10,000 BAU/ml 3+    54.  Dog Epithelia Negative    55. Mixed Feathers Negative    56. Horse Epithelia Negative    57. Cockroach, German 2+    58. Mouse Negative    59. Tobacco Leaf Negative             Intradermal - 01/30/21 1559     Time Antigen Placed 1559     Location Arm    Number of Test 4    Control Negative    Ragweed mix 3+    Mold 2 3+    Dog Negative             Food Adult Perc - 01/30/21 1500     Time Antigen Placed 1502    Allergen Manufacturer Lavella Hammock    Location Back    Number of allergen test 7    Control-Histamine 1 mg/ml 2+    8. Shellfish Mix --   5X4  25. Shrimp --   15X9   26. Crab --   5X5   27. Lobster --   4X4   28. Oyster --   2X2   29. Scallops --   +/-            Past Medical History: Patient Active Problem List   Diagnosis Date Noted   Asthma 01/30/2021   Other adverse food reactions, not elsewhere classified, subsequent encounter 01/30/2021   LVH (left ventricular hypertrophy) 01/04/2021   Acute on chronic systolic heart failure ([place]) 12/08/2020   HFrEF (heart failure with reduced ejection fraction) ([place]) 10/14/2020   Nonspecific abnormal electrocardiogram (ECG) (EKG) 10/14/2020   Atrial tachycardia ([place]) 10/14/2020   Non-pressure chronic ulcer of other part of left foot limited to breakdown of skin ([place]) 10/12/2020   Leg swelling 10/06/2020   DOE (dyspnea on exertion) 10/06/2020   Thrombocytopenia ([place]) 06/26/2019   Acquired absence of other right toe(s) ([place]) 02/19/2018   Boil of buttock 10/24/2016   Amputated toe of left foot ([place]) 10/19/2016   Amputated toe, right ([place]) 06/13/2016   Chronic bronchitis ([place]) 11/24/2015   Other pancytopenia ([place]) 01/24/2015   Chronic pancreatitis ([place]) 01/24/2015   Blood poisoning    Neutropenia ([place])    Iron deficiency anemia due to chronic blood loss    Leukopenia    Sepsis ([place]) 01/21/2015   Leucopenia 01/21/2015   Anemia 01/21/2015   Syncope 01/21/2015   Diarrhea 01/21/2015   Hypokalemia 01/21/2015   Loss of consciousness ([place])    Eczema 10/21/2014   Essential hypertension 10/21/2014   Vasovagal syncope 10/21/2014   Allergic conjunctivitis of both eyes 08/10/2014   Other chronic pancreatitis ([place]) 08/10/2014   COLD (chronic obstructive lung  disease) ([place]) 08/10/2014   Chronic ethmoidal sinusitis 08/10/2014   Bronchitis 10/17/2010   PLANTAR FASCIITIS, LEFT 10/17/2010   OTHER NEUTROPENIA 04/21/2010   CALLUS, TOE 03/15/2010   LEUKOPENIA, MILD 02/20/2010   LIVER FUNCTION TESTS, ABNORMAL, HX OF 02/16/2010   DENTAL CARIES 02/15/2010   ONYCHOMYCOSIS 07/05/2009   ALLERGIC URTICARIA 07/05/2009   PAIN IN LIMB 07/05/2009   ERECTILE DYSFUNCTION 05/25/2008   INFLUENZA 05/25/2008   VIRAL INFECTION 10/01/2007   HSV 05/02/2007   CONDYLOMA ACUMINATUM 05/02/2007   HYPERTENSION 05/02/2007   Other allergic rhinitis 05/02/2007   Chronic asthma, mild persistent, uncomplicated 40/98/1191   Gastroesophageal reflux disease without esophagitis 05/02/2007   PANCREATITIS 05/02/2007   Past Medical History:  Diagnosis Date   Abnormal liver function tests    Arthritis    Boil of buttock    COPD (chronic obstructive pulmonary disease) (HCC)    Chronic Bronchitis.Advair inhaler    Dyspnea    GERD (gastroesophageal reflux disease)    takes Protonix daily   Gout    takes Allopurinol daily   Hemorrhoids    Hypertension    takes HCTZ daily   Hypomagnesemia    Leg ulcer (H[facility])    Mild mitral regurgitation    Pancreatitis    10 years ago   Pneumonia 2016   Tricuspid regurgitation    Tubular adenoma of colon    Past Surgical History: Past Surgical History:  Procedure Laterality Date   AMPUTATION Left 07/14/2014   Procedure: 2nd Toe Amputation;  Surgeon: [name], MD;  Location: [facility];  Service: Orthopedics;  Laterality: Left;   AMPUTATION Left 11/16/2015   Procedure: Left Great Toe Amputation at Metatarsophalangeal Joint;  Surgeon: [name], MD;  Location: [facility];  Service: Orthopedics;  Laterality: Left;   AMPUTATION Right 06/13/2016   Procedure: RIGHT 2nd Toe Amputation;  Surgeon: [name], MD;  Location: [facility];  Service: Orthopedics;  Laterality: Right;   AMPUTATION Left 10/19/2016   Procedure: Amputation Left 3rd Toe;   Surgeon: [name], MD;  Location: [facility];  Service: Orthopedics;  Laterality: Left;   RIGHT/LEFT HEART CATH AND CORONARY ANGIOGRAPHY N/A 12/12/2020   Procedure: RIGHT/LEFT HEART CATH AND CORONARY ANGIOGRAPHY;  Surgeon: Lorretta Harp, MD;  Location: [facility];  Service: Cardiovascular;  Laterality: N/A;   TOE AMPUTATION Left 11/16/2015   left great toe    TOE AMPUTATION Right 06/13/2016   TONSILLECTOMY     Medication List:  Current Outpatient Medications  Medication Sig Dispense Refill   albuterol (VENTOLIN HFA) 108 (90 Base) MCG/ACT inhaler Inhale 2 puffs into the lungs every 4 (four) hours as needed for wheezing or shortness of breath. 54 g 1   allopurinol (ZYLOPRIM) 300 MG tablet Take 1 tablet (300 mg total) by mouth daily. 30 tablet 0   aspirin EC 81 MG tablet Take 81 mg by mouth daily. Swallow whole.     azelastine (ASTELIN) 0.1 % nasal spray Place 1-2 sprays into both nostrils 2 (two) times daily as needed (nasal drainage). Use in each nostril as directed 30 mL 5   Bioflavonoid Products (ESTER-C) TABS Take 1 tablet by mouth daily.     Blood Pressure Monitoring DEVI 1 Units by Does not apply route daily. Measure blood pressure once a day 1 Units 0   budesonide-formoterol (SYMBICORT) 160-4.5 MCG/ACT inhaler USE 2 INHALATIONS TWICE A DAY 30.6 g 3   EPINEPHrine (AUVI-Q) 0.3 mg/0.3 mL IJ SOAJ injection Inject 0.3 mg into the muscle as needed for anaphylaxis. 1 each 1   fluticasone (FLONASE) 50 MCG/ACT nasal spray Place 1 spray into both nostrils 2 (two) times daily. 49 g 11   loratadine (CLARITIN) 10 MG tablet Take 10 mg by mouth daily.     Magnesium Oxide 400 MG CAPS Take 1 capsule (400 mg total) by mouth daily. 90 capsule 3   meloxicam (MOBIC) 7.5 MG tablet Take 1 tablet by mouth once daily 30 tablet 0   metoprolol succinate (TOPROL XL) 25 MG 24 hr tablet Take 1 tablet (25 mg total) by mouth daily. 90 tablet 3   montelukast (SINGULAIR) 10 MG tablet TAKE 1 TABLET AT BEDTIME  90 tablet 3   Olopatadine HCl 0.2 % SOLN Apply 1 drop to eye daily as needed (itchy/watery eyes). 2.5 mL 5   pantoprazole (PROTONIX) 40 MG tablet TAKE ONE TABLET BY MOUTH 30 TO 60 MINUTES BEFORE YOUR FIRST AND LAST MEALS OF THE DAY 180 tablet 3   sacubitril-valsartan (ENTRESTO) 24-26 MG Take 1 tablet by mouth 2 (two) times daily. 60 tablet 2   SPIRULINA PO Take 1 capsule by mouth 3 (three) times daily.     torsemide (DEMADEX) 20 MG tablet Take 2 tablets (40 mg total) by mouth daily. (Patient not taking: Reported on 01/30/2021) 60 tablet 5   No current facility-administered medications for this visit.   Allergies: Allergies  Allergen Reactions   Ciprofloxacin Rash and Other (See Comments)    SYNCOPE    Shellfish Allergy Anaphylaxis   Sulfa Antibiotics Other (See Comments)    SYNCOPE DIZZINESS    Social History: Social History   Socioeconomic History   Marital status: Single    Spouse name: Not on file   Number of children: 1  Years of education:     Highest education level: Not on file  Occupational History   Not on file  Tobacco Use   Smoking status: Never   Smokeless tobacco: Never  Vaping Use   Vaping Use: Never used  Substance and Sexual Activity   Alcohol use: Yes    Alcohol/week: 0.0 standard drinks    Comment: rarely   Drug use: No   Sexual activity: Not Currently  Other Topics Concern   Not on file  Social History Narrative   Not on file   Social Determinants of Health   Financial Resource Strain: Not on file  Food Insecurity: Not on file  Transportation Needs: Not on file  Physical Activity: Not on file  Stress: Not on file  Social Connections: Not on file   Lives in a house. Smoking: denies Occupation: Pensions consultant HistoryFreight forwarder in the house: yes Carpet in the family room: no Carpet in the bedroom: yes Heating: gas Cooling: central Pet: no  Family History: Family History  Adopted: Yes  Problem Relation  Age of Onset   Cancer Sister 5       unknown origin    Review of Systems  Constitutional:  Negative for appetite change, chills, fever and unexpected weight change.  HENT:  Positive for postnasal drip and rhinorrhea. Negative for congestion.   Eyes:  Negative for itching.  Respiratory:  Positive for cough, chest tightness, shortness of breath and wheezing.   Cardiovascular:  Negative for chest pain.  Gastrointestinal:  Positive for diarrhea and nausea. Negative for abdominal pain.  Genitourinary:  Negative for difficulty urinating.  Skin:  Negative for rash.  Allergic/Immunologic: Positive for environmental allergies and food allergies.  Neurological:  Negative for headaches.  Objective: BP 126/78   Pulse (!) 104   Temp 98.6 F (37 C)   Resp 14   Ht 6' 2.5" (1.892 m)   Wt 204 lb (92.5 kg)   SpO2 96%   BMI 25.84 kg/m  Body mass index is 25.84 kg/m. Physical Exam Vitals and nursing note reviewed.  Constitutional:      Appearance: Normal appearance. He is well-developed.  HENT:     Head: Normocephalic and atraumatic.     Right Ear: Tympanic membrane and external ear normal.     Left Ear: Tympanic membrane and external ear normal.     Nose: Nose normal.     Mouth/Throat:     Mouth: Mucous membranes are moist.     Pharynx: Oropharynx is clear.  Eyes:     Conjunctiva/sclera: Conjunctivae normal.  Cardiovascular:     Rate and Rhythm: Normal rate and regular rhythm.     Heart sounds: Normal heart sounds. No murmur heard.   No friction rub. No gallop.  Pulmonary:     Effort: Pulmonary effort is normal.     Breath sounds: Normal breath sounds. No wheezing, rhonchi or rales.  Musculoskeletal:     Cervical back: Neck supple.     Right lower leg: Edema present.     Left lower leg: Edema present.  Skin:    General: Skin is warm.     Findings: No rash.  Neurological:     Mental Status: He is alert and oriented to person, place, and time.  Psychiatric:        Behavior:  Behavior normal.  The plan was reviewed with the patient/family, and all questions/concerned were addressed.  It was my pleasure to see Stephen Whitaker today and participate  in his care. Please feel free to contact me with any questions or concerns.  Sincerely,  Rexene Alberts, DO Allergy & Immunology  [name] office: [place] office: [phone]

## 2021-01-30 NOTE — Assessment & Plan Note (Signed)
.   See assessment and plan as above. 

## 2021-01-30 NOTE — Assessment & Plan Note (Addendum)
Perennial rhinoconjunctivitis symptoms for 50 years.  Tried Claritin, Flonase and Singulair with some benefit.  2019 blood work was positive to dust mites, mold, cat, dog, cockroach, trees, grass, ragweed.  IgE 905.  Today's skin testing showed: Positive to grass, weed pollen, ragweed, trees, mold, dust mites, cat, cockroach.  Start environmental control measures as below.  Continue Singulair (montelukast) 10mg  daily at night.  Use over the counter antihistamines such as Zyrtec (cetirizine), Claritin (loratadine), Allegra (fexofenadine), or Xyzal (levocetirizine) daily as needed. May take twice a day during allergy flares. May switch antihistamines every few months.  Use Flonase (fluticasone) nasal spray 1 spray per nostril twice a day as needed for nasal congestion.   Use azelastine nasal spray 1-2 sprays per nostril twice a day as needed for runny nose/drainage.  Nasal saline spray (i.e., Simply Saline) or nasal saline lavage (i.e., NeilMed) is recommended as needed and prior to medicated nasal sprays.  Use olopatadine eye drops 0.2% once a day as needed for itchy/watery eyes.  Consider allergy injections for long term control if above medications do not help the symptoms - handout given.

## 2021-01-30 NOTE — Assessment & Plan Note (Addendum)
Diagnosed with asthma over 30+ years ago. Followed by pulmonology. Usually requires one course of prednisone and antibiotics per year for "bronchitis" flare.  Today's spirometry showed: Some restriction with no improvement in FEV1 post bronchodilator treatment.  Clinically feeling slightly improved. . Continue to follow up with pulmonology and their recommendations.  . Daily controller medication(s): continue Symbicort 175mcg 2 puffs twice a day with spacer and rinse mouth afterwards. . Continue Singulair (montelukast) 10mg  daily at night. . Spacer given and demonstrated proper use with inhaler. Patient understood technique and all questions/concerned were addressed.  . May use albuterol rescue inhaler 2 puffs every 4 to 6 hours as needed for shortness of breath, chest tightness, coughing, and wheezing. May use albuterol rescue inhaler 2 puffs 5 to 15 minutes prior to strenuous physical activities. Monitor frequency of use.

## 2021-01-30 NOTE — Patient Instructions (Addendum)
Today's skin testing showed: Positive to grass, weed pollen, ragweed, trees, mold, dust mites, cat, cockroach. Positive to shellfish and borderline to mollusks.   Environmental allergies Start environmental control measures as below. Continue Singulair (montelukast) 10mg  daily at night. Use over the counter antihistamines such as Zyrtec (cetirizine), Claritin (loratadine), Allegra (fexofenadine), or Xyzal (levocetirizine) daily as needed. May take twice a day during allergy flares. May switch antihistamines every few months. Use Flonase (fluticasone) nasal spray 1 spray per nostril twice a day as needed for nasal congestion.  Use azelastine nasal spray 1-2 sprays per nostril twice a day as needed for runny nose/drainage. Nasal saline spray (i.e., Simply Saline) or nasal saline lavage (i.e., NeilMed) is recommended as needed and prior to medicated nasal sprays. Use olopatadine eye drops 0.2% once a day as needed for itchy/watery eyes. Consider allergy injections for long term control if above medications do not help the symptoms - handout given.   Food allergies: Continue strict avoidance of shellfish and mollusks (oysters, scallops, mussels) I have prescribed epinephrine injectable and demonstrated proper use. For mild symptoms you can take over the counter antihistamines such as Benadryl and monitor symptoms closely. If symptoms worsen or if you have severe symptoms including breathing issues, throat closure, significant swelling, whole body hives, severe diarrhea and vomiting, lightheadedness then inject epinephrine and seek immediate medical care afterwards. Action plan given.  Breathing: Continue to follow up with pulmonology and their recommendations.  Daily controller medication(s): continue Symbicort 153mcg 2 puffs twice a day with spacer and rinse mouth afterwards. Continue Singulair (montelukast) 10mg  daily at night. Spacer given and demonstrated proper use with inhaler. Patient  understood technique and all questions/concerned were addressed.  May use albuterol rescue inhaler 2 puffs every 4 to 6 hours as needed for shortness of breath, chest tightness, coughing, and wheezing. May use albuterol rescue inhaler 2 puffs 5 to 15 minutes prior to strenuous physical activities. Monitor frequency of use.  Breathing control goals:  Full participation in all desired activities (may need albuterol before activity) Albuterol use two times or less a week on average (not counting use with activity) Cough interfering with sleep two times or less a month Oral steroids no more than once a year No hospitalizations  Follow up in 3 months or sooner if needed.   Reducing Pollen Exposure Pollen seasons: trees (spring), grass (summer) and ragweed/weeds (fall). Keep windows closed in your home and car to lower pollen exposure.  Install air conditioning in the bedroom and throughout the house if possible.  Avoid going out in dry windy days - especially early morning. Pollen counts are highest between 5 - 10 AM and on dry, hot and windy days.  Save outside activities for late afternoon or after a heavy rain, when pollen levels are lower.  Avoid mowing of grass if you have grass pollen allergy. Be aware that pollen can also be transported indoors on people and pets.  Dry your clothes in an automatic dryer rather than hanging them outside where they might collect pollen.  Rinse hair and eyes before bedtime.  Mold Control Mold and fungi can grow on a variety of surfaces provided certain temperature and moisture conditions exist.  Outdoor molds grow on plants, decaying vegetation and soil. The major outdoor mold, Alternaria and Cladosporium, are found in very high numbers during hot and dry conditions. Generally, a late summer - fall peak is seen for common outdoor fungal spores. Rain will temporarily lower outdoor mold spore count, but counts rise  rapidly when the rainy period ends. The most  important indoor molds are Aspergillus and Penicillium. Dark, humid and poorly ventilated basements are ideal sites for mold growth. The next most common sites of mold growth are the bathroom and the kitchen. Outdoor (Seasonal) Mold Control Use air conditioning and keep windows closed. Avoid exposure to decaying vegetation. Avoid leaf raking. Avoid grain handling. Consider wearing a face mask if working in moldy areas.  Indoor (Perennial) Mold Control  Maintain humidity below 50%. Get rid of mold growth on hard surfaces with water, detergent and, if necessary, 5% bleach (do not mix with other cleaners). Then dry the area completely. If mold covers an area more than 10 square feet, consider hiring an indoor environmental professional. For clothing, washing with soap and water is best. If moldy items cannot be cleaned and dried, throw them away. Remove sources e.g. contaminated carpets. Repair and seal leaking roofs or pipes. Using dehumidifiers in damp basements may be helpful, but empty the water and clean units regularly to prevent mildew from forming. All rooms, especially basements, bathrooms and kitchens, require ventilation and cleaning to deter mold and mildew growth. Avoid carpeting on concrete or damp floors, and storing items in damp areas. Control of House Dust Mite Allergen Dust mite allergens are a common trigger of allergy and asthma symptoms. While they can be found throughout the house, these microscopic creatures thrive in warm, humid environments such as bedding, upholstered furniture and carpeting. Because so much time is spent in the bedroom, it is essential to reduce mite levels there.  Encase pillows, mattresses, and box springs in special allergen-proof fabric covers or airtight, zippered plastic covers.  Bedding should be washed weekly in hot water (130 F) and dried in a hot dryer. Allergen-proof covers are available for comforters and pillows that can't be regularly washed.   Wash the allergy-proof covers every few months. Minimize clutter in the bedroom. Keep pets out of the bedroom.  Keep humidity less than 50% by using a dehumidifier or air conditioning. You can buy a humidity measuring device called a hygrometer to monitor this.  If possible, replace carpets with hardwood, linoleum, or washable area rugs. If that's not possible, vacuum frequently with a vacuum that has a HEPA filter. Remove all upholstered furniture and non-washable window drapes from the bedroom. Remove all non-washable stuffed toys from the bedroom.  Wash stuffed toys weekly. Pet Allergen Avoidance: Contrary to popular opinion, there are no "hypoallergenic" breeds of dogs or cats. That is because people are not allergic to an animal's hair, but to an allergen found in the animal's saliva, dander (dead skin flakes) or urine. Pet allergy symptoms typically occur within minutes. For some people, symptoms can build up and become most severe 8 to 12 hours after contact with the animal. People with severe allergies can experience reactions in public places if dander has been transported on the pet owners' clothing. Keeping an animal outdoors is only a partial solution, since homes with pets in the yard still have higher concentrations of animal allergens. Before getting a pet, ask your allergist to determine if you are allergic to animals. If your pet is already considered part of your family, try to minimize contact and keep the pet out of the bedroom and other rooms where you spend a great deal of time. As with dust mites, vacuum carpets often or replace carpet with a hardwood floor, tile or linoleum. High-efficiency particulate air (HEPA) cleaners can reduce allergen levels over time. While  dander and saliva are the source of cat and dog allergens, urine is the source of allergens from rabbits, hamsters, mice and [place] pigs; so ask a non-allergic family member to clean the animal's cage. If you have a  pet allergy, talk to your allergist about the potential for allergy immunotherapy (allergy shots). This strategy can often provide long-term relief. Cockroach Allergen Avoidance Cockroaches are often found in the homes of densely populated urban areas, schools or commercial buildings, but these creatures can lurk almost anywhere. This does not mean that you have a dirty house or living area. Block all areas where roaches can enter the home. This includes crevices, wall cracks and windows.  Cockroaches need water to survive, so fix and seal all leaky faucets and pipes. Have an exterminator go through the house when your family and pets are gone to eliminate any remaining roaches. Keep food in lidded containers and put pet food dishes away after your pets are done eating. Vacuum and sweep the floor after meals, and take out garbage and recyclables. Use lidded garbage containers in the kitchen. Wash dishes immediately after use and clean under stoves, refrigerators or toasters where crumbs can accumulate. Wipe off the stove and other kitchen surfaces and cupboards regularly.

## 2021-01-30 NOTE — Assessment & Plan Note (Signed)
Shrimp caused vomiting; lobster caused vomiting, hand swelling and lost consciousness; oysters cause vomiting. Tolerates finned fish. No prior work up and no Epipen on hand.   Today's skin testing showed: Positive to shellfish and borderline to mollusks.  . Continue strict avoidance of shellfish and mollusks (oysters, scallops, mussels) . I have prescribed epinephrine injectable and demonstrated proper use. For mild symptoms you can take over the counter antihistamines such as Benadryl and monitor symptoms closely. If symptoms worsen or if you have severe symptoms including breathing issues, throat closure, significant swelling, whole body hives, severe diarrhea and vomiting, lightheadedness then inject epinephrine and seek immediate medical care afterwards. . Action plan given.

## 2021-01-31 ENCOUNTER — Telehealth: Payer: Self-pay | Admitting: Physician Assistant

## 2021-01-31 ENCOUNTER — Encounter (HOSPITAL_COMMUNITY): Payer: Self-pay | Admitting: Emergency Medicine

## 2021-01-31 ENCOUNTER — Other Ambulatory Visit: Payer: Self-pay

## 2021-01-31 ENCOUNTER — Ambulatory Visit (HOSPITAL_COMMUNITY)
Admission: EM | Admit: 2021-01-31 | Discharge: 2021-01-31 | Disposition: A | Discharge status: Home / Self Care | Payer: BC Managed Care – PPO | Attending: Physician Assistant | Admitting: Physician Assistant

## 2021-01-31 ENCOUNTER — Other Ambulatory Visit: Payer: BC Managed Care – PPO

## 2021-01-31 DIAGNOSIS — R0602 Shortness of breath: Secondary | ICD-10-CM | POA: Diagnosis not present

## 2021-01-31 DIAGNOSIS — Z8679 Personal history of other diseases of the circulatory system: Secondary | ICD-10-CM | POA: Diagnosis not present

## 2021-01-31 DIAGNOSIS — R601 Generalized edema: Secondary | ICD-10-CM

## 2021-01-31 LAB — CBC WITH DIFFERENTIAL/PLATELET
Abs Immature Granulocytes: 0.01 10*3/uL (ref 0.00–0.07)
Basophils Absolute: 0 10*3/uL (ref 0.0–0.1)
Basophils Relative: 2 %
Eosinophils Absolute: 0 10*3/uL (ref 0.0–0.5)
Eosinophils Relative: 0 %
HCT: 41.5 % (ref 39.0–52.0)
Hemoglobin: 12.9 g/dL — ABNORMAL LOW (ref 13.0–17.0)
Immature Granulocytes: 0 %
Lymphocytes Relative: 19 %
Lymphs Abs: 0.5 10*3/uL — ABNORMAL LOW (ref 0.7–4.0)
MCH: 26.1 pg (ref 26.0–34.0)
MCHC: 31.1 g/dL (ref 30.0–36.0)
MCV: 83.8 fL (ref 80.0–100.0)
Monocytes Absolute: 0.3 10*3/uL (ref 0.1–1.0)
Monocytes Relative: 12 %
Neutro Abs: 1.6 10*3/uL — ABNORMAL LOW (ref 1.7–7.7)
Neutrophils Relative %: 67 %
Platelets: 241 10*3/uL (ref 150–400)
RBC: 4.95 MIL/uL (ref 4.22–5.81)
RDW: 17.9 % — ABNORMAL HIGH (ref 11.5–15.5)
WBC: 2.4 10*3/uL — ABNORMAL LOW (ref 4.0–10.5)
nRBC: 0 % (ref 0.0–0.2)

## 2021-01-31 LAB — BRAIN NATRIURETIC PEPTIDE: B Natriuretic Peptide: >4500 pg/mL — ABNORMAL HIGH (ref 0.0–100.0)

## 2021-01-31 LAB — COMPREHENSIVE METABOLIC PANEL
ALT: 35 U/L (ref 0–44)
AST: 50 U/L — ABNORMAL HIGH (ref 15–41)
Albumin: 2.6 g/dL — ABNORMAL LOW (ref 3.5–5.0)
Alkaline Phosphatase: 76 U/L (ref 38–126)
Anion gap: 7 (ref 5–15)
BUN: 21 mg/dL (ref 8–23)
CO2: 28 mmol/L (ref 22–32)
Calcium: 8.4 mg/dL — ABNORMAL LOW (ref 8.9–10.3)
Chloride: 103 mmol/L (ref 98–111)
Creatinine, Ser: 1.39 mg/dL — ABNORMAL HIGH (ref 0.61–1.24)
GFR, Estimated: 56 mL/min — ABNORMAL LOW (ref >60)
Glucose, Bld: 91 mg/dL (ref 70–99)
Potassium: 3.5 mmol/L (ref 3.5–5.1)
Sodium: 138 mmol/L (ref 135–145)
Total Bilirubin: 0.7 mg/dL (ref 0.3–1.2)
Total Protein: 6.4 g/dL — ABNORMAL LOW (ref 6.5–8.1)

## 2021-01-31 LAB — POCT URINALYSIS DIPSTICK, ED / UC
Glucose, UA: NEGATIVE mg/dL
Leukocytes,Ua: NEGATIVE
Nitrite: NEGATIVE
Protein, ur: >=300 mg/dL — AB
Specific Gravity, Urine: 1.025 (ref 1.005–1.030)
Urobilinogen, UA: 1 mg/dL (ref 0.0–1.0)
pH: 5.5 (ref 5.0–8.0)

## 2021-01-31 NOTE — ED Triage Notes (Signed)
Patient presents to [facility] for evaluation of facial swelling starting two days ago.  Denies itchiness, denies pain, denies dental work being needed.  Patient states he has been switching on and off of different medicines to hslp with the swelling in his legs.  Deniex difficulty breathing/oral swelling.

## 2021-01-31 NOTE — ED Provider Notes (Signed)
[facility]    CSN: [id number] Arrival date & time: 01/31/21  1901      History   Chief Complaint Chief Complaint  Patient presents with   Facial Swelling    HPI Stephen Whitaker is a 65 y.o. male.   Patient presents today with a several day history of increased swelling.  He does have a history of heart failure as well as decreased kidney function and has been working with his cardiologist to manage diuretic dosage.  Last echocardiogram obtained April 2022 showed ejection fraction of 20 to 25% and global hypokinesis.  He is doing daily weights and states that since most recent change of medication he has continued to lose weight almost down dry weight of 200 pounds.  He denies any significant sodium increase or additional changes to medication.  He reports feeling swelling has continued to persist despite weight loss and now involves his abdomen.  He also reports some swelling along his mandible bilaterally.  He denies any throat/tongue swelling dysphagia, odynophagia, muffled voice.  He does report some shortness of breath primarily with exertion but states this is chronic related to his chronic medical conditions.  He reports normal urine output.  He reports a decreased appetite but reports he is able to eat and drink normally.   Past Medical History:  Diagnosis Date   Abnormal liver function tests    Arthritis    Boil of buttock    COPD (chronic obstructive pulmonary disease) (HCC)    Chronic Bronchitis.Advair inhaler    Dyspnea    GERD (gastroesophageal reflux disease)    takes Protonix daily   Gout    takes Allopurinol daily   Hemorrhoids    Hypertension    takes HCTZ daily   Hypomagnesemia    Leg ulcer (HCC)    Mild mitral regurgitation    Pancreatitis    10 years ago   Pneumonia 2016   Tricuspid regurgitation    Tubular adenoma of colon     Patient Active Problem List   Diagnosis Date Noted   Asthma 01/30/2021   Other adverse food reactions, not  elsewhere classified, subsequent encounter 01/30/2021   LVH (left ventricular hypertrophy) 01/04/2021   Acute on chronic systolic heart failure ([place]) 12/08/2020   HFrEF (heart failure with reduced ejection fraction) ([place]) 10/14/2020   Nonspecific abnormal electrocardiogram (ECG) (EKG) 10/14/2020   Atrial tachycardia ([place]) 10/14/2020   Non-pressure chronic ulcer of other part of left foot limited to breakdown of skin ([place]) 10/12/2020   Leg swelling 10/06/2020   DOE (dyspnea on exertion) 10/06/2020   Thrombocytopenia ([place]) 06/26/2019   Acquired absence of other right toe(s) ([place]) 02/19/2018   Boil of buttock 10/24/2016   Amputated toe of left foot ([place]) 10/19/2016   Amputated toe, right ([place]) 06/13/2016   Chronic bronchitis ([place]) 11/24/2015   Other pancytopenia ([place]) 01/24/2015   Chronic pancreatitis ([place]) 01/24/2015   Blood poisoning    Neutropenia (HCC)    Iron deficiency anemia due to chronic blood loss    Leukopenia    Sepsis ([place]) 01/21/2015   Leucopenia 01/21/2015   Anemia 01/21/2015   Syncope 01/21/2015   Diarrhea 01/21/2015   Hypokalemia 01/21/2015   Loss of consciousness ([place])    Eczema 10/21/2014   Essential hypertension 10/21/2014   Vasovagal syncope 10/21/2014   Allergic conjunctivitis of both eyes 08/10/2014   Other chronic pancreatitis ([place]) 08/10/2014   COLD (chronic obstructive lung disease) ([place]) 08/10/2014   Chronic ethmoidal sinusitis 08/10/2014  Bronchitis 10/17/2010   PLANTAR FASCIITIS, LEFT 10/17/2010   OTHER NEUTROPENIA 04/21/2010   CALLUS, TOE 03/15/2010   LEUKOPENIA, MILD 02/20/2010   LIVER FUNCTION TESTS, ABNORMAL, HX OF 02/16/2010   DENTAL CARIES 02/15/2010   ONYCHOMYCOSIS 07/05/2009   ALLERGIC URTICARIA 07/05/2009   PAIN IN LIMB 07/05/2009   ERECTILE DYSFUNCTION 05/25/2008   INFLUENZA 05/25/2008   VIRAL INFECTION 10/01/2007   HSV 05/02/2007   CONDYLOMA ACUMINATUM 05/02/2007   HYPERTENSION 05/02/2007   Other allergic rhinitis 05/02/2007    Chronic asthma, mild persistent, uncomplicated 93/57/0177   Gastroesophageal reflux disease without esophagitis 05/02/2007   PANCREATITIS 05/02/2007    Past Surgical History:  Procedure Laterality Date   AMPUTATION Left 07/14/2014   Procedure: 2nd Toe Amputation;  Surgeon: [name], MD;  Location: [facility];  Service: Orthopedics;  Laterality: Left;   AMPUTATION Left 11/16/2015   Procedure: Left Great Toe Amputation at Metatarsophalangeal Joint;  Surgeon: [name], MD;  Location: [facility];  Service: Orthopedics;  Laterality: Left;   AMPUTATION Right 06/13/2016   Procedure: RIGHT 2nd Toe Amputation;  Surgeon: [name], MD;  Location: [facility];  Service: Orthopedics;  Laterality: Right;   AMPUTATION Left 10/19/2016   Procedure: Amputation Left 3rd Toe;  Surgeon: [name], MD;  Location: [facility];  Service: Orthopedics;  Laterality: Left;   RIGHT/LEFT HEART CATH AND CORONARY ANGIOGRAPHY N/A 12/12/2020   Procedure: RIGHT/LEFT HEART CATH AND CORONARY ANGIOGRAPHY;  Surgeon: Lorretta Harp, MD;  Location: [facility];  Service: Cardiovascular;  Laterality: N/A;   TOE AMPUTATION Left 11/16/2015   left great toe    TOE AMPUTATION Right 06/13/2016   TONSILLECTOMY         Home Medications    Prior to Admission medications   Medication Sig Start Date End Date Taking? Authorizing Provider  albuterol (VENTOLIN HFA) 108 (90 Base) MCG/ACT inhaler Inhale 2 puffs into the lungs every 4 (four) hours as needed for wheezing or shortness of breath. 10/06/20   Tanda Rockers, MD  allopurinol (ZYLOPRIM) 300 MG tablet Take 1 tablet (300 mg total) by mouth daily. 07/29/20   Persons, Bevely Palmer, [place]  aspirin EC 81 MG tablet Take 81 mg by mouth daily. Swallow whole.    [provider]  azelastine (ASTELIN) 0.1 % nasal spray Place 1-2 sprays into both nostrils 2 (two) times daily as needed (nasal drainage). Use in each nostril as directed 01/30/21   Garnet Sierras, DO  Bioflavonoid Products  (ESTER-C) TABS Take 1 tablet by mouth daily.    [provider]  Blood Pressure Monitoring DEVI 1 Units by Does not apply route daily. Measure blood pressure once a day 07/08/19   Melynda Ripple, MD  budesonide-formoterol [facility]) 160-4.5 MCG/ACT inhaler USE 2 INHALATIONS TWICE A DAY 11/14/20   Tanda Rockers, MD  EPINEPHrine (AUVI-Q) 0.3 mg/0.3 mL IJ SOAJ injection Inject 0.3 mg into the muscle as needed for anaphylaxis. 01/30/21   Garnet Sierras, DO  fluticasone (FLONASE) 50 MCG/ACT nasal spray Place 1 spray into both nostrils 2 (two) times daily. 04/08/20   Tanda Rockers, MD  loratadine (CLARITIN) 10 MG tablet Take 10 mg by mouth daily.    [provider]  Magnesium Oxide 400 MG CAPS Take 1 capsule (400 mg total) by mouth daily. 10/28/20   [name], MD  meloxicam (MOBIC) 7.5 MG tablet Take 1 tablet by mouth once daily 01/25/21   Persons, Bevely Palmer, PA  metoprolol  succinate (TOPROL XL) 25 MG 24 hr tablet Take 1 tablet (25 mg total) by mouth daily. 01/04/21   [name], MD  montelukast (SINGULAIR) 10 MG tablet TAKE 1 TABLET AT BEDTIME 06/27/20   Tanda Rockers, MD  Olopatadine HCl 0.2 % SOLN Apply 1 drop to eye daily as needed (itchy/watery eyes). 01/30/21   Garnet Sierras, DO  pantoprazole (PROTONIX) 40 MG tablet TAKE ONE TABLET BY MOUTH 30 TO 60 MINUTES BEFORE YOUR FIRST AND LAST MEALS OF THE DAY 01/25/21   Tanda Rockers, MD  sacubitril-valsartan (ENTRESTO) 24-26 MG Take 1 tablet by mouth 2 (two) times daily. 01/24/21   Chandrasekhar, Mahesh A, MD  SPIRULINA PO Take 1 capsule by mouth 3 (three) times daily.    [provider]  torsemide (DEMADEX) 20 MG tablet Take 2 tablets (40 mg total) by mouth daily. Patient not taking: Reported on 01/30/2021 01/25/21   [name], MD    Family History Family History  Adopted: Yes  Problem Relation Age of Onset   Cancer Sister 80       unknown origin     Social History Social History    Tobacco Use   Smoking status: Never   Smokeless tobacco: Never  Vaping Use   Vaping Use: Never used  Substance Use Topics   Alcohol use: Yes    Alcohol/week: 0.0 standard drinks    Comment: rarely   Drug use: No     Allergies   Ciprofloxacin, Shellfish allergy, and Sulfa antibiotics   Review of Systems Review of Systems  Constitutional:  Positive for activity change, appetite change and fatigue. Negative for fever.  HENT:  Positive for facial swelling.   Respiratory:  Positive for shortness of breath. Negative for cough.   Cardiovascular:  Positive for leg swelling. Negative for chest pain and palpitations.  Gastrointestinal:  Negative for abdominal pain, diarrhea, nausea and vomiting.  Neurological:  Negative for dizziness, light-headedness and headaches.    Physical Exam Triage Vital Signs ED Triage Vitals  Enc Vitals Group     BP 01/31/21 2001 120/79     Pulse Rate 01/31/21 2001 (!) 106     Resp 01/31/21 2001 18     Temp 01/31/21 2001 98 F (36.7 C)     Temp Source 01/31/21 2001 Oral     SpO2 01/31/21 2001 100 %     Weight --      Height --      Head Circumference --      Peak Flow --      Pain Score 01/31/21 1959 4     Pain Loc --      Pain Edu? --      Excl. in [place]? --    No data found.  Updated Vital Signs BP 120/79 (BP Location: Right Arm)   Pulse (!) 106   Temp 98 F (36.7 C) (Oral)   Resp 18   SpO2 100%   Visual Acuity Right Eye Distance:   Left Eye Distance:   Bilateral Distance:    Right Eye Near:   Left Eye Near:    Bilateral Near:     Physical Exam Vitals reviewed.  Constitutional:      General: He is awake.     Appearance: Normal appearance. He is normal weight. He is not ill-appearing.     Comments: Very pleasant male appears stated age sitting comfortably on exam table in no acute distress.  HENT:  Head: Normocephalic and atraumatic.     Jaw: Swelling present.     Comments: Swelling noted along mandible bilaterally.     Mouth/Throat:     Mouth: No angioedema.     Pharynx: Uvula midline. No oropharyngeal exudate or posterior oropharyngeal erythema.     Comments: Normal-appearing oropharynx.  No evidence of Ludwig angina or angioedema. Cardiovascular:     Rate and Rhythm: Normal rate and regular rhythm.     Heart sounds: Normal heart sounds, S1 normal and S2 normal. No murmur heard.    Comments: 3+ pitting edema to abdomen Pulmonary:     Effort: Pulmonary effort is normal.     Breath sounds: Normal breath sounds. No stridor. No wheezing, rhonchi or rales.     Comments: Clear to auscultation bilaterally; decreased aeration bilateral bases. Abdominal:     General: Bowel sounds are normal.     Palpations: Abdomen is soft.     Tenderness: There is no abdominal tenderness.  Musculoskeletal:     Right lower leg: 3+ Edema present.     Left lower leg: 3+ Edema present.  Neurological:     Mental Status: He is alert.  Psychiatric:        Behavior: Behavior is cooperative.     UC Treatments / Results  Labs (all labs ordered are listed, but only abnormal results are displayed) Labs Reviewed  POCT URINALYSIS DIPSTICK, ED / UC - Abnormal; Notable for the following components:      Result Value   Bilirubin Urine SMALL (*)    Ketones, ur TRACE (*)    Hgb urine dipstick TRACE (*)    Protein, ur >=300 (*)    All other components within normal limits  CBC WITH DIFFERENTIAL/PLATELET  COMPREHENSIVE METABOLIC PANEL  BRAIN NATRIURETIC PEPTIDE    EKG   Radiology No results found.  Procedures Procedures (including critical care time)  Medications Ordered in UC Medications - No data to display  Initial Impression / Assessment and Plan / UC Course  I have reviewed the triage vital signs and the nursing notes.  Pertinent labs & imaging results that were available during my care of the patient were reviewed by me and considered in my medical decision making (see chart for details).      Vital signs  are reassuring today.  Discussed concern for acute heart failure given sudden worsening of swelling with associated shortness of breath and potential utility of going to the emergency room for further evaluation including stat labs, however, patient declined this today.  He is oxygenating well so we will obtain labs that patient and try to arrange close follow-up.  CBC, CMP, BNP obtained today-results pending.  Patient was encouraged to continue diuretic dose as prescribed by cardiology.  Request that he contact cardiology first thing in the morning to discuss symptoms.  Also recommended that he monitor his pulse oxygenation and if this is decreasing he needs to go to the emergency room.  Encourage patient to continue with daily weights as previously recommended.  Discussed that if symptoms not improving overnight or if he has any abnormal lab work obtained today he will need to go to the hospital to which she expressed understanding.  Strict return precautions given to which patient expressed understanding. Final Clinical Impressions(s) / UC Diagnoses   Final diagnoses:  Anasarca  Shortness of breath  History of heart failure     Discharge Instructions      I am concerned about the amount of swelling  that you have.  If you do not have improvement overnight it is imperative that you go to the emergency room as we discussed.  We will contact you with any abnormal lab work.  Please follow-up with your PCP or cardiologist within the next few days to adjust your diuretic medications.  If your weight does not continue to trend down based on your daily weights you will need to go to the emergency room as we discussed.     ED Prescriptions   None    PDMP not reviewed this encounter.   Terrilee Croak, PA-C 01/31/21 2108

## 2021-01-31 NOTE — Discharge Instructions (Addendum)
I am concerned about the amount of swelling that you have.  If you do not have improvement overnight it is imperative that you go to the emergency room as we discussed.  We will contact you with any abnormal lab work.  Please follow-up with your PCP or cardiologist within the next few days to adjust your diuretic medications.  If your weight does not continue to trend down based on your daily weights you will need to go to the emergency room as we discussed.

## 2021-01-31 NOTE — Telephone Encounter (Signed)
Received vm from pt asking for records be faxed to [facility]. That office uses Epic and can view patients records. No need to fax.

## 2021-02-07 ENCOUNTER — Other Ambulatory Visit: Payer: Self-pay

## 2021-02-07 ENCOUNTER — Other Ambulatory Visit: Payer: BC Managed Care – PPO | Admitting: *Deleted

## 2021-02-07 DIAGNOSIS — I509 Heart failure, unspecified: Secondary | ICD-10-CM | POA: Diagnosis not present

## 2021-02-08 ENCOUNTER — Telehealth: Payer: Self-pay | Admitting: Pharmacist

## 2021-02-08 LAB — BASIC METABOLIC PANEL
BUN/Creatinine Ratio: 17 (ref 10–24)
BUN: 23 mg/dL (ref 8–27)
CO2: 23 mmol/L (ref 20–29)
Calcium: 8.5 mg/dL — ABNORMAL LOW (ref 8.6–10.2)
Chloride: 103 mmol/L (ref 96–106)
Creatinine, Ser: 1.33 mg/dL — ABNORMAL HIGH (ref 0.76–1.27)
Glucose: 91 mg/dL (ref 65–99)
Potassium: 4 mmol/L (ref 3.5–5.2)
Sodium: 141 mmol/L (ref 134–144)
eGFR: 59 mL/min/{1.73_m2} — ABNORMAL LOW (ref >59)

## 2021-02-08 NOTE — Telephone Encounter (Signed)
BMET stable since changing from furosemide 40mg  to higher potency of torsemide 40mg  daily. Called pt who does confirm he is taking torsemide each day (med was marked as not taking on 6/13 visit). He reports poor adherence with Entresto. States he has been missing doses and also taking it only once daily instead of twice daily. Advised pt he needs to start taking Entresto twice daily as prescribed. He also reports nausea and fatigue that he attributes to his Toprol. Currently taking at 7pm (works 8pm to 8am). Advised pt to move Toprol dosing to before he goes to bed, and to take it with some food. He will keep follow up PharmD appt next Tuesday.

## 2021-02-13 NOTE — Progress Notes (Signed)
Patient ID: Stephen Whitaker                 DOB: [birth year]                      MRN: [id number]     HPI: Stephen Whitaker is a 65 y.o. male referred by Dr. Gasper Whitaker to pharmacy clinic for HF medication management. PMH is significant for HF, HTN, COPD, GERD, moderate TR, gout, SVT, asthma, and chronic pancreatitis. Most recent LVEF 20-25% on 12/08/20 (previously 55-60% in 2016). Cardiac cath 12/12/20 showed clean coronary arteries with nonischemic cardiomyopathy.   Pt last seen in HF clinic on 01/24/21 and BP was 132/80, however pt did not take CHF/HTN medications prior to visit. Additionally, pt reported SOB when completing ADLs. Reported orthopnea, fatigue, decreased muscle mass, smaller arms, and upper body weakness (getting up from a seated position takes more effort). Reported home BP readings 128-130s. Discussed with Dr Stephen Whitaker and patient was started on Entresto 24-26 mg twice daily, amlodipine was discontinued, and furosemide 40 mg switched to higher potency of torsemide 40mg  daily. Follow-up BMET was stable.   On 02/08/21 telephone encounter, patient reported poor adherence with Entresto. Stated missing doses and taking Entresto once daily instead of twice daily. Pt was advised to start taking it twice daily as prescribed. Additionally, pt reported nausea and fatigue that he attributed to Toprol. Patient advised to move Toprol dosing to before bedtime and to take it with food.  Patient presents today in good spirits. Reports non-compliance with CHF medications (aspirin, torsemide, Toprol, Entresto) due to frequent nausea. Reports its been a couple of days since he has taken some of his medications. Reports having frequent nausea and bloating, and occasional diarrhea when eating. Reports bilateral LE swelling, weakness, lack of energy, dry mouth, and drowsiness. Of note, patient brought home medications today which included many OTC medications for GI management including super digestive  enzyme, pantoprazole, Papaya enzymes, Lactaid, and OTC stomach relief. Reports not checking BP at home, but was "good" last week when checked at the retail pharmacy. Reports having a pill box at home, however does not use it (no particular reason why).  Current CHF/HTN meds: nonadherent to all Entresto 24-26 mg twice daily Metoprolol succinate 25mg  daily Torsemide 40 mg daily  Previously tried medications: furosemide (nausea/diarrhea and fatigue)  BP goal: <130/80 mmHg   Family History: Cancer (age of onset: 88) in his sister. He was adopted.   Social History: denies tobacco use  Home BP readings: none  Wt Readings from Last 3 Encounters:  02/14/21 205 lb (93 kg)  01/30/21 204 lb (92.5 kg)  01/24/21 213 lb (96.6 kg)   BP Readings from Last 3 Encounters:  02/14/21 138/76  01/31/21 120/79  01/30/21 126/78   Pulse Readings from Last 3 Encounters:  02/14/21 94  01/31/21 (!) 106  01/30/21 (!) 104    Renal function: Estimated Creatinine Clearance: 64.4 mL/min (A) (by C-G formula based on SCr of 1.33 mg/dL (H)).  Past Medical History:  Diagnosis Date   Abnormal liver function tests    Arthritis    Boil of buttock    COPD (chronic obstructive pulmonary disease) (HCC)    Chronic Bronchitis.Advair inhaler    Dyspnea    GERD (gastroesophageal reflux disease)    takes Protonix daily   Gout    takes Allopurinol daily   Hemorrhoids    Hypertension    takes HCTZ daily   Hypomagnesemia  Leg ulcer ([place])    Mild mitral regurgitation    Pancreatitis    10 years ago   Pneumonia 2016   Tricuspid regurgitation    Tubular adenoma of colon     Current Outpatient Medications on File Prior to Visit  Medication Sig Dispense Refill   albuterol (VENTOLIN HFA) 108 (90 Base) MCG/ACT inhaler Inhale 2 puffs into the lungs every 4 (four) hours as needed for wheezing or shortness of breath. 54 g 1   allopurinol (ZYLOPRIM) 300 MG tablet Take 1 tablet (300 mg total) by mouth daily. 30  tablet 0   aspirin EC 81 MG tablet Take 81 mg by mouth daily. Swallow whole.     Bioflavonoid Products (ESTER-C) TABS Take 1 tablet by mouth daily.     budesonide-formoterol (SYMBICORT) 160-4.5 MCG/ACT inhaler USE 2 INHALATIONS TWICE A DAY 30.6 g 3   Digestive Enzymes (PAPAYA AND ENZYMES) CHEW Chew 3 tablets by mouth as needed (as needed for spicy food, indigestion, and heart burn).     fluticasone (FLONASE) 50 MCG/ACT nasal spray Place 1 spray into both nostrils 2 (two) times daily. 49 g 11   Lactase (LACTAID FAST ACT PO) Take 2 tablets by mouth as needed (with dairy food).     loratadine (CLARITIN) 10 MG tablet Take 10 mg by mouth daily.     Magnesium Oxide 400 MG CAPS Take 1 capsule (400 mg total) by mouth daily. 90 capsule 3   meloxicam (MOBIC) 7.5 MG tablet Take 1 tablet by mouth once daily 30 tablet 0   metoprolol succinate (TOPROL XL) 25 MG 24 hr tablet Take 1 tablet (25 mg total) by mouth daily. 90 tablet 3   montelukast (SINGULAIR) 10 MG tablet TAKE 1 TABLET AT BEDTIME 90 tablet 3   Olopatadine HCl 0.2 % SOLN Apply 1 drop to eye daily as needed (itchy/watery eyes). 2.5 mL 5   pantoprazole (PROTONIX) 40 MG tablet TAKE ONE TABLET BY MOUTH 30 TO 60 MINUTES BEFORE YOUR FIRST AND LAST MEALS OF THE DAY 180 tablet 3   Probiotic Product (SUPER PROBIOTIC DIGESTIVE) CAPS Take 1 tablet by mouth as needed (stomach upset). Take as needed with meals     sacubitril-valsartan (ENTRESTO) 24-26 MG Take 1 tablet by mouth 2 (two) times daily. 60 tablet 2   SPIRULINA PO Take 1 capsule by mouth 3 (three) times daily.     torsemide (DEMADEX) 20 MG tablet Take 2 tablets (40 mg total) by mouth daily. 60 tablet 5   azelastine (ASTELIN) 0.1 % nasal spray Place 1-2 sprays into both nostrils 2 (two) times daily as needed (nasal drainage). Use in each nostril as directed (Patient not taking: Reported on 02/14/2021) 30 mL 5   Blood Pressure Monitoring DEVI 1 Units by Does not apply route daily. Measure blood pressure  once a day 1 Units 0   EPINEPHrine (AUVI-Q) 0.3 mg/0.3 mL IJ SOAJ injection Inject 0.3 mg into the muscle as needed for anaphylaxis. (Patient not taking: Reported on 02/14/2021) 1 each 1   No current facility-administered medications on file prior to visit.    Allergies  Allergen Reactions   Ciprofloxacin Rash and Other (See Comments)    SYNCOPE    Shellfish Allergy Anaphylaxis   Sulfa Antibiotics Other (See Comments)    SYNCOPE DIZZINESS      Assessment/Plan:  1. CHF - BP above goal <130/80 mmHg. Medication adherence is poor as patient misses medications due to persistent nausea. Patient's GI issues have been persistent now  even when switching some of his CHF medications (furosemide to torsemide, metoprolol tartrate to succinate XL, and discontinuing amlodipine). Additionally, pt has been taking various OTC supplements containing digestive enzymes and acid relief for GI discomfort. Encouraged patient to discuss GI related issues with PCP as there is a low probability his current HF medications are causing these symptoms. Encouraged patient to avoid trigger foods that may exacerbate these symptoms. Patient verbalized understanding. Extensively discussed the importance of medication adherence to improve his ejection fraction, reduce risk of cardiovascular events, and improve symptoms of SOB and edema. Encouraged patient to start using his pill box to help organize his medications. Emphasized importance of taking CHF meds as directed including Entresto 24-26 mg twice daily, metoprolol succinate 25 mg daily, and torsemide 40 mg daily. Follow-up appointment scheduled with Dr. Gasper Whitaker in ~1 week and in HF clinic in 5-6 weeks. Prefer to add on Jardiance 10mg  daily for added CHF benefit at next visit once pt is adherent to current meds (prior authorization already approved and confirmed copay would be $0).  Lorel Monaco, PharmD, [facility] [address] [place], [place] [address] Phone: [phone]; Fax: [phone]

## 2021-02-14 ENCOUNTER — Encounter: Payer: Self-pay | Admitting: Pharmacist

## 2021-02-14 ENCOUNTER — Ambulatory Visit (INDEPENDENT_AMBULATORY_CARE_PROVIDER_SITE_OTHER): Payer: BC Managed Care – PPO | Admitting: Pharmacist

## 2021-02-14 ENCOUNTER — Other Ambulatory Visit: Payer: Self-pay

## 2021-02-14 ENCOUNTER — Other Ambulatory Visit: Payer: BC Managed Care – PPO

## 2021-02-14 ENCOUNTER — Ambulatory Visit: Payer: BC Managed Care – PPO | Admitting: Physician Assistant

## 2021-02-14 VITALS — BP 138/76 | HR 94 | Wt 205.0 lb

## 2021-02-14 DIAGNOSIS — I509 Heart failure, unspecified: Secondary | ICD-10-CM

## 2021-02-14 DIAGNOSIS — Z9114 Patient's other noncompliance with medication regimen: Secondary | ICD-10-CM | POA: Diagnosis not present

## 2021-02-14 DIAGNOSIS — I5020 Unspecified systolic (congestive) heart failure: Secondary | ICD-10-CM | POA: Diagnosis not present

## 2021-02-14 NOTE — Patient Instructions (Signed)
It was nice to see you today!  Your goal blood pressure is <130/80 mmHg.  Medication Changes:  Morning Heart Medications Sacubitril-valsartan (Entresto) 24-26 mg in the morning Torsemide 40 mg in the morning (2 tablets of 20 mg)  Evening Heart Medications Sacubitril-valsartan (Entresto) 24-26 mg at night Metoprolol succinate (Toprol XL) 25 mg at night  Monitor blood pressure at home daily and keep a log (on your phone or piece of paper) to bring with you to your next visit. Write down date, time, blood pressure and pulse.  Keep up the good work with diet and exercise. Aim for a diet full of vegetables, fruit and lean meats (chicken, [place], fish). Try to limit salt intake by eating fresh or frozen vegetables (instead of canned), rinse canned vegetables prior to cooking and do not add any additional salt to meals.

## 2021-02-15 ENCOUNTER — Encounter: Payer: Self-pay | Admitting: Physician Assistant

## 2021-02-15 ENCOUNTER — Ambulatory Visit (INDEPENDENT_AMBULATORY_CARE_PROVIDER_SITE_OTHER): Payer: BC Managed Care – PPO | Admitting: Physician Assistant

## 2021-02-15 DIAGNOSIS — M1A09X0 Idiopathic chronic gout, multiple sites, without tophus (tophi): Secondary | ICD-10-CM

## 2021-02-15 DIAGNOSIS — Z9114 Patient's other noncompliance with medication regimen: Secondary | ICD-10-CM | POA: Insufficient documentation

## 2021-02-15 MED ORDER — ALLOPURINOL 300 MG PO TABS: 300.0000 mg | ORAL_TABLET | Freq: Every day | ORAL | 0 refills | Status: DC

## 2021-02-15 NOTE — Progress Notes (Signed)
Office Visit Note   Patient: Stephen Whitaker           Date of Birth: 1955/10/14           MRN: [id number] Visit Date: 02/15/2021              Requested by: [name], NP [address] [place],  [place] [address] PCP: [name], NP  Chief Complaint  Patient presents with   Left Foot - Follow-up      HPI: Patient presents in follow-up for his foot ulcers on his first MTP joint on the plantar surface as well as on the medial side.  He has a history of Charcot collapse.  He has these periodically trimmed.  He says he is getting more pain where the foot has collapsed on the medial side.  He is also having quite a bit of swelling in his left greater than right lower extremity he thinks this is because of his medications.  I have wanted to wrap him before but he cannot miss work  Assessment & Plan: Visit Diagnoses: No diagnosis found.  Plan: I am concerned about the continued ulcers and how close they are to bone.  There is no sign of infection today.  I did redo his padding in his shoes.  Again I asked if he could be allowed to be compression wraps because he cannot wear his compression socks currently because they are just too tight.  I have also asked that he try to get more supportive new wear shoes.  The issue has completely broken down causing even more wear on the medial side of his foot  Follow-Up Instructions: No follow-ups on file.   Ortho Exam  Patient is alert, oriented, no adenopathy, well-dressed, normal affect, normal respiratory effort. Left foot he does have a palpable dorsalis pedis pulse.  Status post previous amputations.  He has significant swelling in his foot going up into his lower leg but no open ulcers no cellulitis.  Also has some swelling in the right leg.  The callus beneath the first MTP has a good granulation tissue does not probe to bone but is close to bone.  It was debrided to healthy surfaces and touched with silver nitrate.  New  metatarsal donut was placed in this area in his shoe.  On the plantar surface pad was also changed in his shoe and another pad was added.  Told him to be sure this was in the right place.  He was very tender when debriding the callus on the side.  He did have some bleeding this was touched to a silver nitrate stick did not probe to bone did not tunnel no surrounding or ascending cellulitis  Imaging: No results found. No images are attached to the encounter.  Labs: Lab Results  Component Value Date   HGBA1C 5.6 08/10/2014   ESRSEDRATE 27 (H) 07/05/2015   ESRSEDRATE 80 (H) 01/21/2015   CRP 1.8 (H) 07/05/2015   LABURIC 5.9 07/28/2020   LABURIC 5.4 02/19/2018   LABURIC 6.0 08/10/2014   REPTSTATUS 01/22/2015 FINAL 01/21/2015   CULT NO GROWTH Performed at [facility]  01/21/2015     Lab Results  Component Value Date   ALBUMIN 2.6 (L) 01/31/2021   ALBUMIN 3.9 06/26/2019   ALBUMIN 3.9 10/22/2018    Lab Results  Component Value Date   MG 1.5 (L) 10/24/2020   MG 1.8 01/21/2015   Lab Results  Component Value Date  VD25OH 32.4 01/23/2017    No results found for: PREALBUMIN CBC EXTENDED Latest Ref Rng & Units 01/31/2021 12/12/2020 12/12/2020  WBC 4.0 - 10.5 K/uL 2.4(L) - -  RBC 4.22 - 5.81 MIL/uL 4.95 - -  HGB 13.0 - 17.0 g/dL 12.9(L) 12.2(L) 12.6(L)  HCT 39.0 - 52.0 % 41.5 36.0(L) 37.0(L)  PLT 150 - 400 K/uL 241 - -  NEUTROABS 1.7 - 7.7 K/uL 1.6(L) - -  LYMPHSABS 0.7 - 4.0 K/uL 0.5(L) - -     There is no height or weight on file to calculate BMI.  Orders:  No orders of the defined types were placed in this encounter.  No orders of the defined types were placed in this encounter.    Procedures: No procedures performed  Clinical Data: No additional findings.  ROS:  All other systems negative, except as noted in the HPI. Review of Systems  Objective: Vital Signs: There were no vitals taken for this visit.  Specialty Comments:  No specialty comments  available.  PMFS History: Patient Active Problem List   Diagnosis Date Noted   Nonadherence to medication 02/15/2021   Asthma 01/30/2021   Other adverse food reactions, not elsewhere classified, subsequent encounter 01/30/2021   LVH (left ventricular hypertrophy) 01/04/2021   Acute on chronic systolic heart failure ([place]) 12/08/2020   HFrEF (heart failure with reduced ejection fraction) ([place]) 10/14/2020   Nonspecific abnormal electrocardiogram (ECG) (EKG) 10/14/2020   Atrial tachycardia ([place]) 10/14/2020   Non-pressure chronic ulcer of other part of left foot limited to breakdown of skin ([place]) 10/12/2020   Leg swelling 10/06/2020   DOE (dyspnea on exertion) 10/06/2020   Thrombocytopenia ([place]) 06/26/2019   Acquired absence of other right toe(s) ([place]) 02/19/2018   Boil of buttock 10/24/2016   Amputated toe of left foot ([place]) 10/19/2016   Amputated toe, right ([place]) 06/13/2016   Chronic bronchitis ([place]) 11/24/2015   Other pancytopenia ([place]) 01/24/2015   Chronic pancreatitis ([place]) 01/24/2015   Blood poisoning    Neutropenia ([place])    Iron deficiency anemia due to chronic blood loss    Leukopenia    Sepsis ([place]) 01/21/2015   Leucopenia 01/21/2015   Anemia 01/21/2015   Syncope 01/21/2015   Diarrhea 01/21/2015   Hypokalemia 01/21/2015   Loss of consciousness ([place])    Eczema 10/21/2014   Essential hypertension 10/21/2014   Vasovagal syncope 10/21/2014   Allergic conjunctivitis of both eyes 08/10/2014   Other chronic pancreatitis ([place]) 08/10/2014   COLD (chronic obstructive lung disease) ([place]) 08/10/2014   Chronic ethmoidal sinusitis 08/10/2014   Bronchitis 10/17/2010   PLANTAR FASCIITIS, LEFT 10/17/2010   OTHER NEUTROPENIA 04/21/2010   CALLUS, TOE 03/15/2010   LEUKOPENIA, MILD 02/20/2010   LIVER FUNCTION TESTS, ABNORMAL, HX OF 02/16/2010   DENTAL CARIES 02/15/2010   ONYCHOMYCOSIS 07/05/2009   ALLERGIC URTICARIA 07/05/2009   PAIN IN LIMB 07/05/2009   ERECTILE DYSFUNCTION  05/25/2008   INFLUENZA 05/25/2008   VIRAL INFECTION 10/01/2007   HSV 05/02/2007   CONDYLOMA ACUMINATUM 05/02/2007   HYPERTENSION 05/02/2007   Other allergic rhinitis 05/02/2007   Chronic asthma, mild persistent, uncomplicated 40/05/2724   Gastroesophageal reflux disease without esophagitis 05/02/2007   PANCREATITIS 05/02/2007   Past Medical History:  Diagnosis Date   Abnormal liver function tests    Arthritis    Boil of buttock    COPD (chronic obstructive pulmonary disease) (HCC)    Chronic Bronchitis.Advair inhaler    Dyspnea    GERD (gastroesophageal reflux disease)    takes Protonix  daily   Gout    takes Allopurinol daily   Hemorrhoids    Hypertension    takes HCTZ daily   Hypomagnesemia    Leg ulcer (HCC)    Mild mitral regurgitation    Pancreatitis    10 years ago   Pneumonia 2016   Tricuspid regurgitation    Tubular adenoma of colon     Family History  Adopted: Yes  Problem Relation Age of Onset   Cancer Sister 43       unknown origin     Past Surgical History:  Procedure Laterality Date   AMPUTATION Left 07/14/2014   Procedure: 2nd Toe Amputation;  Surgeon: [name], MD;  Location: [facility];  Service: Orthopedics;  Laterality: Left;   AMPUTATION Left 11/16/2015   Procedure: Left Great Toe Amputation at Metatarsophalangeal Joint;  Surgeon: [name], MD;  Location: [facility];  Service: Orthopedics;  Laterality: Left;   AMPUTATION Right 06/13/2016   Procedure: RIGHT 2nd Toe Amputation;  Surgeon: [name], MD;  Location: [facility];  Service: Orthopedics;  Laterality: Right;   AMPUTATION Left 10/19/2016   Procedure: Amputation Left 3rd Toe;  Surgeon: [name], MD;  Location: [facility];  Service: Orthopedics;  Laterality: Left;   RIGHT/LEFT HEART CATH AND CORONARY ANGIOGRAPHY N/A 12/12/2020   Procedure: RIGHT/LEFT HEART CATH AND CORONARY ANGIOGRAPHY;  Surgeon: Lorretta Harp, MD;  Location: [facility];  Service: Cardiovascular;  Laterality: N/A;    TOE AMPUTATION Left 11/16/2015   left great toe    TOE AMPUTATION Right 06/13/2016   TONSILLECTOMY     Social History   Occupational History   Not on file  Tobacco Use   Smoking status: Never   Smokeless tobacco: Never  Vaping Use   Vaping Use: Never used  Substance and Sexual Activity   Alcohol use: Yes    Alcohol/week: 0.0 standard drinks    Comment: rarely   Drug use: No   Sexual activity: Not Currently

## 2021-02-16 ENCOUNTER — Telehealth: Payer: Self-pay | Admitting: Orthopedic Surgery

## 2021-02-16 ENCOUNTER — Other Ambulatory Visit: Payer: Self-pay | Admitting: Physician Assistant

## 2021-02-16 MED ORDER — HYDROCODONE-ACETAMINOPHEN 5-325 MG PO TABS: 1.0000 | ORAL_TABLET | ORAL | 0 refills | Status: DC | PRN

## 2021-02-16 NOTE — Telephone Encounter (Signed)
Pt would like to know if you can prescribe him that's stronger to calm down the throbbing pain.   CB [phone]

## 2021-02-16 NOTE — Telephone Encounter (Signed)
You saw pt in office yesterday please see below.

## 2021-02-16 NOTE — Telephone Encounter (Signed)
Spoke with the patient.  He did have quite a bit of pain after debridement of the callus on the medial foot.  It is getting somewhat better.  I did call him in a few hydrocodone

## 2021-02-21 NOTE — Progress Notes (Signed)
Cardiology Office Note:    Date:  02/22/2021   ID:  Stephen Whitaker, DOB [birth year], MRN [id number]  PCP:  [name], NP   [place]  Cardiologist:  Rudean Haskell MD Advanced Practice Provider:  No care team member to display Electrophysiologist:  None      CC: Follow up HF  History of Present Illness:    Stephen Whitaker is a 65 y.o. male with a hx of HTN, COPD, GERD, moderate TR, Gout who presents for evaluation 10/14/20.  In interim has had new runs of SVT and started on low dose BB.  Add on for 12/08/20 for decompensated HF. Had [place] and RHC with widely patent CORS. Seen 02/22/21.  In interim of this visit, patient has seen HF clinic and has had some non-adherence to his medications.  Recently saw [facility].  Patient notes that he is doing OK.  Since last visit notes.  There are no interval hospital/ED visit.    No chest pain or pressure.  SOB is better but still not resolved and no PND/Orthopnea.  Leg swelling has improved.  No palpitations or syncope.  Is having ankle pain after an orthopedic procedure.  Received hydrocodone.  Has taking metoprolol now before he goes to sleep with improved tolerance.  Has had some nausea this has improved.  Has some early satiety.  Has some generalized weakness.  Past Medical History:  Diagnosis Date   Abnormal liver function tests    Arthritis    Boil of buttock    COPD (chronic obstructive pulmonary disease) (HCC)    Chronic Bronchitis.Advair inhaler    Dyspnea    GERD (gastroesophageal reflux disease)    takes Protonix daily   Gout    takes Allopurinol daily   Hemorrhoids    Hypertension    takes HCTZ daily   Hypomagnesemia    Leg ulcer (HCC)    Mild mitral regurgitation    Pancreatitis    10 years ago   Pneumonia 2016   Tricuspid regurgitation    Tubular adenoma of colon     Past Surgical History:  Procedure Laterality Date   AMPUTATION Left 07/14/2014   Procedure: 2nd Toe  Amputation;  Surgeon: [name], MD;  Location: [facility];  Service: Orthopedics;  Laterality: Left;   AMPUTATION Left 11/16/2015   Procedure: Left Great Toe Amputation at Metatarsophalangeal Joint;  Surgeon: [name], MD;  Location: [facility];  Service: Orthopedics;  Laterality: Left;   AMPUTATION Right 06/13/2016   Procedure: RIGHT 2nd Toe Amputation;  Surgeon: [name], MD;  Location: [facility];  Service: Orthopedics;  Laterality: Right;   AMPUTATION Left 10/19/2016   Procedure: Amputation Left 3rd Toe;  Surgeon: [name], MD;  Location: [facility];  Service: Orthopedics;  Laterality: Left;   RIGHT/LEFT HEART CATH AND CORONARY ANGIOGRAPHY N/A 12/12/2020   Procedure: RIGHT/LEFT HEART CATH AND CORONARY ANGIOGRAPHY;  Surgeon: Lorretta Harp, MD;  Location: [facility];  Service: Cardiovascular;  Laterality: N/A;   TOE AMPUTATION Left 11/16/2015   left great toe    TOE AMPUTATION Right 06/13/2016   TONSILLECTOMY      Current Medications: Current Meds  Medication Sig   albuterol (VENTOLIN HFA) 108 (90 Base) MCG/ACT inhaler Inhale 2 puffs into the lungs every 4 (four) hours as needed for wheezing or shortness of breath.   allopurinol (ZYLOPRIM) 300 MG tablet Take 1 tablet (300 mg total) by mouth  daily.   azelastine (ASTELIN) 0.1 % nasal spray Place 1-2 sprays into both nostrils 2 (two) times daily as needed (nasal drainage). Use in each nostril as directed   Bioflavonoid Products (ESTER-C) TABS Take 1 tablet by mouth daily.   Blood Pressure Monitoring DEVI 1 Units by Does not apply route daily. Measure blood pressure once a day   budesonide-formoterol (SYMBICORT) 160-4.5 MCG/ACT inhaler USE 2 INHALATIONS TWICE A DAY   Digestive Enzymes (PAPAYA AND ENZYMES) CHEW Chew 3 tablets by mouth as needed (as needed for spicy food, indigestion, and heart burn).   empagliflozin (JARDIANCE) 10 MG TABS tablet Take 1 tablet (10 mg total) by mouth daily before breakfast.   EPINEPHrine (AUVI-Q) 0.3  mg/0.3 mL IJ SOAJ injection Inject 0.3 mg into the muscle as needed for anaphylaxis.   fluticasone (FLONASE) 50 MCG/ACT nasal spray Place 1 spray into both nostrils 2 (two) times daily.   HYDROcodone-acetaminophen (NORCO/VICODIN) 5-325 MG tablet Take 1 tablet by mouth every 4 (four) hours as needed for moderate pain.   Lactase (LACTAID FAST ACT PO) Take 2 tablets by mouth as needed (with dairy food).   loratadine (CLARITIN) 10 MG tablet Take 10 mg by mouth daily.   Magnesium Oxide 400 MG CAPS Take 1 capsule (400 mg total) by mouth daily.   meloxicam (MOBIC) 7.5 MG tablet Take 1 tablet by mouth once daily   metoprolol succinate (TOPROL XL) 25 MG 24 hr tablet Take 1 tablet (25 mg total) by mouth daily.   MITIGARE 0.6 MG CAPS Take 1 capsule by mouth as needed.   montelukast (SINGULAIR) 10 MG tablet TAKE 1 TABLET AT BEDTIME   Olopatadine HCl 0.2 % SOLN Apply 1 drop to eye daily as needed (itchy/watery eyes).   pantoprazole (PROTONIX) 40 MG tablet TAKE ONE TABLET BY MOUTH 30 TO 60 MINUTES BEFORE YOUR FIRST AND LAST MEALS OF THE DAY   Probiotic Product (SUPER PROBIOTIC DIGESTIVE) CAPS Take 1 tablet by mouth as needed (stomach upset). Take as needed with meals   sacubitril-valsartan (ENTRESTO) 24-26 MG Take 1 tablet by mouth 2 (two) times daily.   SPIRULINA PO Take 1 capsule by mouth 3 (three) times daily.   torsemide (DEMADEX) 20 MG tablet Take 2 tablets (40 mg total) by mouth daily.   [DISCONTINUED] aspirin EC 81 MG tablet Take 81 mg by mouth daily. Swallow whole.     Allergies:   Ciprofloxacin, Shellfish allergy, and Sulfa antibiotics   Social History   Socioeconomic History   Marital status: Single    Spouse name: Not on file   Number of children: 1   Years of education:     Highest education level: Not on file  Occupational History   Not on file  Tobacco Use   Smoking status: Never   Smokeless tobacco: Never  Vaping Use   Vaping Use: Never used  Substance and Sexual Activity    Alcohol use: Yes    Alcohol/week: 0.0 standard drinks    Comment: rarely   Drug use: No   Sexual activity: Not Currently  Other Topics Concern   Not on file  Social History Narrative   Not on file   Social Determinants of Health   Financial Resource Strain: Not on file  Food Insecurity: Not on file  Transportation Needs: Not on file  Physical Activity: Not on file  Stress: Not on file  Social Connections: Not on file    Social: Works at a [employer] History: The patient's family  history includes Cancer (age of onset: 51) in his sister. He was adopted.  ROS:   Please see the history of present illness.     All other systems reviewed and are negative.  EKGs/Labs/Other Studies Reviewed:    The following studies were reviewed today: EKG:   12/08/20:  Sinus tachycardia Diffuse TWI 10/14/20: Cannot exclude Atrial Tachycardia  Notable LVH; consider Echo Contrast with study 120/25/2017: SR rate 72 notable LVH different p wave morphology from 09/24/20  Cardiac Event Monitoring: Date: 11/04/20 Results: Patient had a minimum heart rate of 90 bpm, maximum heart rate of 184 bpm, and average heart rate of 107 bpm. Predominant underlying rhythm was sinus rhythm. Twenty three runs of supraventricular tachycardia occurred lasting 14 seconds at longest with a max rate of 184 bpm at fastest. Isolated PACs were occasional (4.6%), with rare couplets and triplets present. Isolated PVCs were rare (<1.0%), with rare couplets present. No evidence of complete heart block. Triggered and diary events associated with sinus rhythm with isolated PACs.   Asymptomatic SVT.   Transthoracic Echocardiogram: Date: 11/09/2014 Results: Study Conclusions  - Left ventricle: The cavity size was normal. Wall thickness was    normal. Systolic function was normal. The estimated ejection    fraction was in the range of 55% to 60%. Wall motion was normal;    there were no regional wall motion  abnormalities. Doppler    parameters are consistent with abnormal left ventricular    relaxation (grade 1 diastolic dysfunction).  - Mitral valve: There was mild regurgitation.  - Right atrium: The atrium was mildly dilated.  - Tricuspid valve: There was moderate regurgitation.  - Pulmonary arteries: PA peak pressure: 34 mm Hg (S).   Date: 12/08/20 Results:    1. Left ventricular ejection fraction, by estimation, is 20 to 25%. The  left ventricle has severely decreased function. The left ventricle  demonstrates global hypokinesis. The left ventricular internal cavity size  was mildly dilated. Left ventricular  diastolic parameters are consistent with Grade II diastolic dysfunction  (pseudonormalization).   2. Right ventricular systolic function is mildly reduced. The right  ventricular size is moderately enlarged. There is moderately elevated  pulmonary artery systolic pressure. The estimated right ventricular  systolic pressure is 01.0 mmHg.   3. Left atrial size was severely dilated.   4. Right atrial size was severely dilated.   5. A small pericardial effusion is present. The pericardial effusion is  posterior to the left ventricle.   6. The mitral valve is normal in structure. Mild to moderate mitral valve  regurgitation.   7. Tricuspid valve regurgitation is moderate.   8. The aortic valve is tricuspid. There is mild thickening of the aortic  valve. Aortic valve regurgitation is trivial. Mild aortic valve sclerosis  is present, with no evidence of aortic valve stenosis.   9. Aortic dilatation noted. There is mild dilatation of the ascending  aorta, measuring 37 mm.  10. The inferior vena cava is dilated in size with <50% respiratory  variability, suggesting right atrial pressure of 15 mmHg.   NM V/Q Scan Date: 10/07/2020 Physiologic distribution of radiopharmaceutical throughout both lungs. Slight heterogeneity of radiotracer without segmental defect.   IMPRESSION: Low  probability of pulmonary embolism.  Left/Right Heart Catheterizations: Date:12/12/20 Results: Mr. Reser has a nonischemic cardiomyopathy.  He has clean coronary arteries.  He has moderately elevated wedge and filling pressures.  Medical therapy will be recommended with guideline directed optimal medical therapy. Dr. Julieanne Manson , the patient's  primary cardiologist, was notified of these results.  The radial sheath was removed and a TR band was placed on the right wrist to achieve patent hemostasis.  The patient left lab in stable condition.  Fick Cardiac Output 5.26 L/min  Fick Cardiac Output Index 2.34 (L/min)/BSA  RA A Wave 11 mmHg  RA V Wave 16 mmHg  RA Mean 13 mmHg  RV Systolic Pressure 49 mmHg  RV Diastolic Pressure 1 mmHg  RV EDP 12 mmHg  PA Systolic Pressure 48 mmHg  PA Diastolic Pressure 23 mmHg  PA Mean 33 mmHg  PW A Wave 26 mmHg  PW V Wave 27 mmHg  PW Mean 25 mmHg  AO Systolic Pressure 073 mmHg  AO Diastolic Pressure 81 mmHg  AO Mean 98 mmHg  LV Systolic Pressure 710 mmHg  LV Diastolic Pressure 8 mmHg  LV EDP 23 mmHg  AOp Systolic Pressure 626 mmHg  AOp Diastolic Pressure 66 mmHg  AOp Mean Pressure 948 mmHg  LVp Systolic Pressure 546 mmHg  LVp Diastolic Pressure 8 mmHg  LVp EDP Pressure 22 mmHg  QP/QS 1  TPVR Index 14.1 HRUI  TSVR Index 41.45 HRUI  PVR SVR Ratio 0.1  TPVR/TSVR Ratio 0.34     Recent Labs: 10/06/2020: TSH 2.87 10/24/2020: Magnesium 1.5 01/24/2021: NT-Pro BNP 22,081 01/31/2021: ALT 35; B Natriuretic Peptide >4,500.0; Hemoglobin 12.9; Platelets 241 02/07/2021: BUN 23; Creatinine, Ser 1.33; Potassium 4.0; Sodium 141  Recent Lipid Panel    Component Value Date/Time   CHOL 155 06/26/2019 1035   TRIG 121 06/26/2019 1035   HDL 39 (L) 06/26/2019 1035   CHOLHDL 4.0 06/26/2019 1035   CHOLHDL 3.3 08/10/2014 1023   VLDL 15 08/10/2014 1023   LDLCALC 94 06/26/2019 1035    Risk Assessment/Calculations:     The 10-year ASCVD risk score Mikey Bussing DC Brooke Bonito., et  al., 2013) is: 14.4%   Values used to calculate the score:     Age: 58 years     Sex: Male     Is Non-Hispanic African American: Yes     Diabetic: No     Tobacco smoker: No     Systolic Blood Pressure: 270 mmHg     Is BP treated: Yes     HDL Cholesterol: 39 mg/dL     Total Cholesterol: 155 mg/dL   Physical Exam:    VS:  BP 118/80 Comment: left arm  Pulse 87   Ht 6' 2.5" (1.892 m)   Wt 91.2 kg   SpO2 97%   BMI 25.46 kg/m     Wt Readings from Last 3 Encounters:  02/22/21 91.2 kg  02/14/21 93 kg  01/30/21 92.5 kg     GEN: Well nourished, well developed in no acute distress HEENT: Normal NECK: Mild JVD at 45 degrees; No carotid bruits LYMPHATICS: No lymphadenopathy CARDIAC: Regular rate and rhythm, ,II/VI holosystolic murmur, no rubs, gallops RESPIRATORY:  Clear to auscultation without rales, wheezing or rhonchi  ABDOMEN: Soft, non-tender, non-distended MUSCULOSKELETAL:  +3 edema up to inner thigh still  improved from prior; No deformity  SKIN: Warm and dry NEUROLOGIC:  Alert and oriented x 3 PSYCHIATRIC:  Normal affect   ASSESSMENT:    1. HFrEF (heart failure with reduced ejection fraction) ([place])   2. Fatigue, unspecified type   3. Paroxysmal SVT (supraventricular tachycardia) ([place])   4. Atrial tachycardia (HCC)     PLAN:    In order of problems listed above:  Heart Failure Reduced EF LVH  SVT- likely AT  History of IDA - NYHA class III, Stage C, hypervolemic, etiology from unclear cause with negative ischemic work up - Diuretic regimen: Torsemide 40 mg Daily - continue  metoprolol to 25 mg PO Daily - Entresto 24-26 mg PO BID  - continue PharmD  - Strict I/Os, daily weights, and fluid restriction of < 2 L  - Will start Jardiance 10 mg PO Daily and BMP in 7-10 days - stop ASA - getting BMP, BNP, Mg, and Anemia Panel, if low K will start MRA - at next visit will consider repeat echo  September follow up unless new symptoms or abnormal test results  warranting change in plan      Medication Adjustments/Labs and Tests Ordered: Current medicines are reviewed at length with the patient today.  Concerns regarding medicines are outlined above.  Orders Placed This Encounter  Procedures   Vitamin B12   Folate   Iron and TIBC   Ferritin   Basic metabolic panel   Pro b natriuretic peptide (BNP)   Magnesium   Basic metabolic panel    Meds ordered this encounter  Medications   empagliflozin (JARDIANCE) 10 MG TABS tablet    Sig: Take 1 tablet (10 mg total) by mouth daily before breakfast.    Dispense:  90 tablet    Refill:  3     Patient Instructions  Medication Instructions:  Your physician has recommended you make the following change in your medication:  STOP: Aspirin  START:  Jardiance 10 mg by mouth daily before breakfast *If you need a refill on your cardiac medications before your next appointment, please call your pharmacy*   Lab Work: TODAY: anemia panel, BNP, BMP, Mg IN 7-10 days: BMP If you have labs (blood work) drawn today and your tests are completely normal, you will receive your results only by: [place] (if you have MyChart) OR A paper copy in the mail If you have any lab test that is abnormal or we need to change your treatment, we will call you to review the results.   Testing/Procedures: NONE   Follow-Up: At [facility], you and your health needs are our priority.  As part of our continuing mission to provide you with exceptional heart care, we have created designated Provider Care Teams.  These Care Teams include your primary Cardiologist (physician) and Advanced Practice Providers (APPs -  Physician Assistants and Nurse Practitioners) who all work together to provide you with the care you need, when you need it.  We recommend signing up for the patient portal called "MyChart".  Sign up information is provided on this After Visit Summary.  MyChart is used to connect with patients for  Virtual Visits (Telemedicine).  Patients are able to view lab/test results, encounter notes, upcoming appointments, etc.  Non-urgent messages can be sent to your provider as well.   To learn more about what you can do with MyChart, go to [email or web address].    Your next appointment:   2 month(s)  The format for your next appointment:   In Person  Provider:   You may see [name], MD or one of the following Advanced Practice Providers on your designated Care Team:   Melina Copa, PA-C Ermalinda Barrios, PA-C        Signed, [name], MD  02/22/2021 11:17 AM    [place]

## 2021-02-22 ENCOUNTER — Ambulatory Visit (INDEPENDENT_AMBULATORY_CARE_PROVIDER_SITE_OTHER): Payer: BC Managed Care – PPO | Admitting: Internal Medicine

## 2021-02-22 ENCOUNTER — Encounter: Payer: Self-pay | Admitting: Internal Medicine

## 2021-02-22 ENCOUNTER — Other Ambulatory Visit: Payer: Self-pay

## 2021-02-22 VITALS — BP 118/80 | HR 87 | Ht 74.5 in | Wt 201.0 lb

## 2021-02-22 DIAGNOSIS — I471 Supraventricular tachycardia: Secondary | ICD-10-CM

## 2021-02-22 DIAGNOSIS — I5020 Unspecified systolic (congestive) heart failure: Secondary | ICD-10-CM | POA: Diagnosis not present

## 2021-02-22 DIAGNOSIS — R5383 Other fatigue: Secondary | ICD-10-CM | POA: Diagnosis not present

## 2021-02-22 MED ORDER — EMPAGLIFLOZIN 10 MG PO TABS: 10.0000 mg | ORAL_TABLET | Freq: Every day | ORAL | 3 refills | Status: DC

## 2021-02-22 NOTE — Patient Instructions (Signed)
Medication Instructions:  Your physician has recommended you make the following change in your medication:  STOP: Aspirin  START:  Jardiance 10 mg by mouth daily before breakfast *If you need a refill on your cardiac medications before your next appointment, please call your pharmacy*   Lab Work: TODAY: anemia panel, BNP, BMP, Mg IN 7-10 days: BMP If you have labs (blood work) drawn today and your tests are completely normal, you will receive your results only by: [place] (if you have MyChart) OR A paper copy in the mail If you have any lab test that is abnormal or we need to change your treatment, we will call you to review the results.   Testing/Procedures: NONE   Follow-Up: At [facility], you and your health needs are our priority.  As part of our continuing mission to provide you with exceptional heart care, we have created designated Provider Care Teams.  These Care Teams include your primary Cardiologist (physician) and Advanced Practice Providers (APPs -  Physician Assistants and Nurse Practitioners) who all work together to provide you with the care you need, when you need it.  We recommend signing up for the patient portal called "MyChart".  Sign up information is provided on this After Visit Summary.  MyChart is used to connect with patients for Virtual Visits (Telemedicine).  Patients are able to view lab/test results, encounter notes, upcoming appointments, etc.  Non-urgent messages can be sent to your provider as well.   To learn more about what you can do with MyChart, go to [email or web address].    Your next appointment:   2 month(s)  The format for your next appointment:   In Person  Provider:   You may see [name], MD or one of the following Advanced Practice Providers on your designated Care Team:   Melina Copa, PA-C Ermalinda Barrios, PA-C

## 2021-02-23 LAB — BASIC METABOLIC PANEL
BUN/Creatinine Ratio: 19 (ref 10–24)
BUN: 35 mg/dL — ABNORMAL HIGH (ref 8–27)
CO2: 24 mmol/L (ref 20–29)
Calcium: 8.7 mg/dL (ref 8.6–10.2)
Chloride: 98 mmol/L (ref 96–106)
Creatinine, Ser: 1.81 mg/dL — ABNORMAL HIGH (ref 0.76–1.27)
Glucose: 87 mg/dL (ref 65–99)
Potassium: 4.5 mmol/L (ref 3.5–5.2)
Sodium: 137 mmol/L (ref 134–144)
eGFR: 41 mL/min/{1.73_m2} — ABNORMAL LOW (ref >59)

## 2021-02-23 LAB — IRON AND TIBC
Iron Saturation: 34 % (ref 15–55)
Iron: 83 ug/dL (ref 38–169)
Total Iron Binding Capacity: 246 ug/dL — ABNORMAL LOW (ref 250–450)
UIBC: 163 ug/dL (ref 111–343)

## 2021-02-23 LAB — FERRITIN: Ferritin: 749 ng/mL — ABNORMAL HIGH (ref 30–400)

## 2021-02-23 LAB — VITAMIN B12: Vitamin B-12: 771 pg/mL (ref 232–1245)

## 2021-02-23 LAB — FOLATE: Folate: >20 ng/mL (ref >3.0)

## 2021-02-23 LAB — PRO B NATRIURETIC PEPTIDE: NT-Pro BNP: 20322 pg/mL — ABNORMAL HIGH (ref 0–376)

## 2021-02-23 LAB — MAGNESIUM: Magnesium: 1.5 mg/dL — ABNORMAL LOW (ref 1.6–2.3)

## 2021-02-28 ENCOUNTER — Telehealth: Payer: Self-pay

## 2021-02-28 ENCOUNTER — Other Ambulatory Visit: Payer: Self-pay

## 2021-02-28 ENCOUNTER — Ambulatory Visit (HOSPITAL_COMMUNITY)
Admission: EM | Admit: 2021-02-28 | Discharge: 2021-02-28 | Disposition: A | Discharge status: Home / Self Care | Payer: BC Managed Care – PPO | Attending: Family Medicine | Admitting: Family Medicine

## 2021-02-28 ENCOUNTER — Encounter (HOSPITAL_COMMUNITY): Payer: Self-pay

## 2021-02-28 DIAGNOSIS — R062 Wheezing: Secondary | ICD-10-CM

## 2021-02-28 DIAGNOSIS — J069 Acute upper respiratory infection, unspecified: Secondary | ICD-10-CM

## 2021-02-28 DIAGNOSIS — R0602 Shortness of breath: Secondary | ICD-10-CM

## 2021-02-28 MED ORDER — MAGNESIUM OXIDE 400 MG PO CAPS: 800.0000 mg | ORAL_CAPSULE | Freq: Every day | ORAL | 3 refills | Status: DC

## 2021-02-28 MED ORDER — PREDNISONE 20 MG PO TABS: 40.0000 mg | ORAL_TABLET | Freq: Every day | ORAL | 0 refills | Status: DC

## 2021-02-28 MED ORDER — AZITHROMYCIN 250 MG PO TABS: ORAL_TABLET | ORAL | 0 refills | Status: DC

## 2021-02-28 NOTE — Telephone Encounter (Signed)
-----   Message from [name], MD sent at 02/24/2021  7:24 AM EDT ----- Results: Significant BNP elevation (slight improvement) Increase in creatinine (despite this, coming off his medications could cause significant worsening in HFrEF) Improvement in IDA Hypomagnesemia Plan: Double Magnesium Supplementation Has follow up labs in 7-10 days after SGLT2i  [name], MD

## 2021-02-28 NOTE — Telephone Encounter (Signed)
Pt has not started taking Jardiance at this time.  He reports that he forgot about medication.  I reminded him that labs will be needed 7-10 days after starting medication.  Lab appointment changed to March 13, 2021.  Pt is aware of lab appointment date change.  All questions answered.

## 2021-02-28 NOTE — ED Triage Notes (Signed)
Pt c/o cough congestion, diarrhea and nausea X 4 days.  States he has taken [employer] and states he has not had relief.   Pt c/o having trouble with some medications that he has been taking. States he has been having stomach  problems since then.

## 2021-03-01 ENCOUNTER — Ambulatory Visit: Payer: BC Managed Care – PPO | Admitting: Family

## 2021-03-02 ENCOUNTER — Other Ambulatory Visit: Payer: BC Managed Care – PPO

## 2021-03-04 NOTE — ED Provider Notes (Signed)
[facility]    CSN: [id number] Arrival date & time: 02/28/21  1921      History   Chief Complaint Chief Complaint  Patient presents with   Cough   Nasal Congestion   Nausea    HPI Stephen Whitaker is a 65 y.o. male.   Patient presenting today with 4-day history of cough, nasal congestion, diarrhea, nausea, malaise, low-grade fever, chills, body aches.  He denies chest pain, shortness of breath, vomiting or intolerance to p.o., dizziness, headache, syncope.  No no new sick contacts.  Trying Pepto-Bismol and over-the-counter fever reducers with mild temporary relief.  Past medical history significant for hypertension, heart failure, asthma, chronic bronchitis, allergic rhinitis-ongoing regimen of Symbicort, as needed albuterol, Flonase, Astelin nasal spray, antihistamines for asthma and allergy control.   Past Medical History:  Diagnosis Date   Abnormal liver function tests    Arthritis    Boil of buttock    COPD (chronic obstructive pulmonary disease) (HCC)    Chronic Bronchitis.Advair inhaler    Dyspnea    GERD (gastroesophageal reflux disease)    takes Protonix daily   Gout    takes Allopurinol daily   Hemorrhoids    Hypertension    takes HCTZ daily   Hypomagnesemia    Leg ulcer (HCC)    Mild mitral regurgitation    Pancreatitis    10 years ago   Pneumonia 2016   Tricuspid regurgitation    Tubular adenoma of colon     Patient Active Problem List   Diagnosis Date Noted   Fatigue 02/22/2021   Nonadherence to medication 02/15/2021   Asthma 01/30/2021   Other adverse food reactions, not elsewhere classified, subsequent encounter 01/30/2021   LVH (left ventricular hypertrophy) 01/04/2021   Acute on chronic systolic heart failure ([place]) 12/08/2020   HFrEF (heart failure with reduced ejection fraction) ([place]) 10/14/2020   Nonspecific abnormal electrocardiogram (ECG) (EKG) 10/14/2020   Atrial tachycardia ([place]) 10/14/2020   Non-pressure chronic ulcer of  other part of left foot limited to breakdown of skin ([place]) 10/12/2020   Leg swelling 10/06/2020   DOE (dyspnea on exertion) 10/06/2020   Thrombocytopenia ([place]) 06/26/2019   Acquired absence of other right toe(s) ([place]) 02/19/2018   Boil of buttock 10/24/2016   Amputated toe of left foot ([place]) 10/19/2016   Amputated toe, right ([place]) 06/13/2016   Chronic bronchitis ([place]) 11/24/2015   Other pancytopenia ([place]) 01/24/2015   Chronic pancreatitis ([place]) 01/24/2015   Blood poisoning    Neutropenia (HCC)    Iron deficiency anemia due to chronic blood loss    Leukopenia    Sepsis ([place]) 01/21/2015   Leucopenia 01/21/2015   Anemia 01/21/2015   Syncope 01/21/2015   Diarrhea 01/21/2015   Hypokalemia 01/21/2015   Loss of consciousness ([place])    Eczema 10/21/2014   Essential hypertension 10/21/2014   Vasovagal syncope 10/21/2014   Allergic conjunctivitis of both eyes 08/10/2014   Other chronic pancreatitis ([place]) 08/10/2014   COLD (chronic obstructive lung disease) ([place]) 08/10/2014   Chronic ethmoidal sinusitis 08/10/2014   Bronchitis 10/17/2010   PLANTAR FASCIITIS, LEFT 10/17/2010   OTHER NEUTROPENIA 04/21/2010   CALLUS, TOE 03/15/2010   LEUKOPENIA, MILD 02/20/2010   LIVER FUNCTION TESTS, ABNORMAL, HX OF 02/16/2010   DENTAL CARIES 02/15/2010   ONYCHOMYCOSIS 07/05/2009   ALLERGIC URTICARIA 07/05/2009   PAIN IN LIMB 07/05/2009   ERECTILE DYSFUNCTION 05/25/2008   INFLUENZA 05/25/2008   VIRAL INFECTION 10/01/2007   HSV 05/02/2007   CONDYLOMA ACUMINATUM 05/02/2007  HYPERTENSION 05/02/2007   Other allergic rhinitis 05/02/2007   Chronic asthma, mild persistent, uncomplicated 21/19/4174   Gastroesophageal reflux disease without esophagitis 05/02/2007   PANCREATITIS 05/02/2007    Past Surgical History:  Procedure Laterality Date   AMPUTATION Left 07/14/2014   Procedure: 2nd Toe Amputation;  Surgeon: [name], MD;  Location: [place];  Service: Orthopedics;  Laterality: Left;    AMPUTATION Left 11/16/2015   Procedure: Left Great Toe Amputation at Metatarsophalangeal Joint;  Surgeon: [name], MD;  Location: [facility];  Service: Orthopedics;  Laterality: Left;   AMPUTATION Right 06/13/2016   Procedure: RIGHT 2nd Toe Amputation;  Surgeon: [name], MD;  Location: [place];  Service: Orthopedics;  Laterality: Right;   AMPUTATION Left 10/19/2016   Procedure: Amputation Left 3rd Toe;  Surgeon: [name], MD;  Location: [facility];  Service: Orthopedics;  Laterality: Left;   RIGHT/LEFT HEART CATH AND CORONARY ANGIOGRAPHY N/A 12/12/2020   Procedure: RIGHT/LEFT HEART CATH AND CORONARY ANGIOGRAPHY;  Surgeon: Lorretta Harp, MD;  Location: [facility];  Service: Cardiovascular;  Laterality: N/A;   TOE AMPUTATION Left 11/16/2015   left great toe    TOE AMPUTATION Right 06/13/2016   TONSILLECTOMY       Home Medications    Prior to Admission medications   Medication Sig Start Date End Date Taking? Authorizing Provider  albuterol (VENTOLIN HFA) 108 (90 Base) MCG/ACT inhaler Inhale 2 puffs into the lungs every 4 (four) hours as needed for wheezing or shortness of breath. 10/06/20   Tanda Rockers, MD  allopurinol (ZYLOPRIM) 300 MG tablet Take 1 tablet (300 mg total) by mouth daily. 02/15/21   Persons, Bevely Palmer, PA  azelastine (ASTELIN) 0.1 % nasal spray Place 1-2 sprays into both nostrils 2 (two) times daily as needed (nasal drainage). Use in each nostril as directed 01/30/21   Garnet Sierras, DO  azithromycin [facility]) 250 MG tablet Take 2 tabs day one, then 1 tab daily until complete 02/28/21   Volney American, PA-C  Bioflavonoid Products (ESTER-C) TABS Take 1 tablet by mouth daily.    [provider]  Blood Pressure Monitoring DEVI 1 Units by Does not apply route daily. Measure blood pressure once a day 07/08/19   Melynda Ripple, MD  budesonide-formoterol [facility]) 160-4.5 MCG/ACT inhaler USE 2 INHALATIONS TWICE A DAY 11/14/20   Tanda Rockers, MD   Digestive Enzymes (PAPAYA AND ENZYMES) CHEW Chew 3 tablets by mouth as needed (as needed for spicy food, indigestion, and heart burn).    [provider]  empagliflozin (JARDIANCE) 10 MG TABS tablet Take 1 tablet (10 mg total) by mouth daily before breakfast. 02/22/21   Chandrasekhar, Mahesh A, MD  EPINEPHrine (AUVI-Q) 0.3 mg/0.3 mL IJ SOAJ injection Inject 0.3 mg into the muscle as needed for anaphylaxis. 01/30/21   Garnet Sierras, DO  fluticasone (FLONASE) 50 MCG/ACT nasal spray Place 1 spray into both nostrils 2 (two) times daily. 04/08/20   Tanda Rockers, MD  HYDROcodone-acetaminophen (NORCO/VICODIN) 5-325 MG tablet Take 1 tablet by mouth every 4 (four) hours as needed for moderate pain. 02/16/21   Persons, Bevely Palmer, PA  Lactase (LACTAID FAST ACT PO) Take 2 tablets by mouth as needed (with dairy food).    [provider]  loratadine (CLARITIN) 10 MG tablet Take 10 mg by mouth daily.    [provider]  Magnesium Oxide 400 MG CAPS Take 2 capsules (800 mg total) by mouth daily. 02/28/21  Rudean Haskell A, MD  meloxicam (MOBIC) 7.5 MG tablet Take 1 tablet by mouth once daily 01/25/21   Persons, Bevely Palmer, PA  metoprolol succinate (TOPROL XL) 25 MG 24 hr tablet Take 1 tablet (25 mg total) by mouth daily. 01/04/21   Chandrasekhar, Mahesh A, MD  MITIGARE 0.6 MG CAPS Take 1 capsule by mouth as needed. 02/02/21   [provider]  montelukast (SINGULAIR) 10 MG tablet TAKE 1 TABLET AT BEDTIME 06/27/20   Tanda Rockers, MD  Olopatadine HCl 0.2 % SOLN Apply 1 drop to eye daily as needed (itchy/watery eyes). 01/30/21   Garnet Sierras, DO  pantoprazole (PROTONIX) 40 MG tablet TAKE ONE TABLET BY MOUTH 30 TO 60 MINUTES BEFORE YOUR FIRST AND LAST MEALS OF THE DAY 01/25/21   Tanda Rockers, MD  predniSONE (DELTASONE) 20 MG tablet Take 2 tablets (40 mg total) by mouth daily with breakfast. 02/28/21   Volney American, PA-C  Probiotic Product (SUPER PROBIOTIC DIGESTIVE) CAPS Take 1  tablet by mouth as needed (stomach upset). Take as needed with meals    [provider]  sacubitril-valsartan (ENTRESTO) 24-26 MG Take 1 tablet by mouth 2 (two) times daily. 01/24/21   Chandrasekhar, Mahesh A, MD  SPIRULINA PO Take 1 capsule by mouth 3 (three) times daily.    [provider]  torsemide (DEMADEX) 20 MG tablet Take 2 tablets (40 mg total) by mouth daily. 01/25/21   [name], MD    Family History Family History  Adopted: Yes  Problem Relation Age of Onset   Cancer Sister 1       unknown origin     Social History Social History   Tobacco Use   Smoking status: Never   Smokeless tobacco: Never  Vaping Use   Vaping Use: Never used  Substance Use Topics   Alcohol use: Yes    Alcohol/week: 0.0 standard drinks    Comment: rarely   Drug use: No     Allergies   Ciprofloxacin, Shellfish allergy, and Sulfa antibiotics  Review of Systems Review of Systems Per HPI  Physical Exam Triage Vital Signs ED Triage Vitals  Enc Vitals Group     BP 02/28/21 2032 100/76     Pulse Rate 02/28/21 2031 (!) 110     Resp 02/28/21 2032 17     Temp 02/28/21 2032 99.9 F (37.7 C)     Temp Source 02/28/21 2032 Oral     SpO2 02/28/21 2031 100 %     Weight --      Height --      Head Circumference --      Peak Flow --      Pain Score 02/28/21 2028 5     Pain Loc --      Pain Edu? --      Excl. in [place]? --    No data found.  Updated Vital Signs BP 100/76   Pulse 68   Temp 99.9 F (37.7 C) (Oral)   Resp 17   SpO2 100%   Visual Acuity Right Eye Distance:   Left Eye Distance:   Bilateral Distance:    Right Eye Near:   Left Eye Near:    Bilateral Near:     Physical Exam Vitals and nursing note reviewed.  Constitutional:      Appearance: Normal appearance.  HENT:     Head: Atraumatic.     Right Ear: Tympanic membrane normal.     Left Ear:  Tympanic membrane normal.     Nose: Rhinorrhea present.     Mouth/Throat:     Mouth: Mucous  membranes are moist.     Pharynx: Posterior oropharyngeal erythema present.  Eyes:     Extraocular Movements: Extraocular movements intact.     Conjunctiva/sclera: Conjunctivae normal.  Cardiovascular:     Rate and Rhythm: Normal rate and regular rhythm.  Pulmonary:     Effort: Pulmonary effort is normal. No respiratory distress.     Breath sounds: Wheezing present. No rales.     Comments: Moderate bilateral wheezes but in no respiratory distress, breathing comfortably and speaking full sentences on room air Abdominal:     General: Bowel sounds are normal. There is no distension.     Palpations: Abdomen is soft.     Tenderness: There is no abdominal tenderness. There is no guarding.  Musculoskeletal:        General: Normal range of motion.     Cervical back: Normal range of motion and neck supple.  Skin:    General: Skin is warm and dry.  Neurological:     General: No focal deficit present.     Mental Status: He is oriented to person, place, and time.  Psychiatric:        Mood and Affect: Mood normal.        Thought Content: Thought content normal.        Judgment: Judgment normal.     UC Treatments / Results  Labs (all labs ordered are listed, but only abnormal results are displayed) Labs Reviewed - No data to display  EKG   Radiology No results found.  Procedures Procedures (including critical care time)  Medications Ordered in UC Medications - No data to display  Initial Impression / Assessment and Plan / UC Course  I have reviewed the triage vital signs and the nursing notes.  Pertinent labs & imaging results that were available during my care of the patient were reviewed by me and considered in my medical decision making (see chart for details).     We will treat for COPD exacerbation with prednisone, azithromycin and continued Symbicort and albuterol inhalers and allergy regimen.  Likely initially viral etiology, declines viral testing today.  Work note  given with strict return precautions for acutely worsening symptoms.  Final Clinical Impressions(s) / UC Diagnoses   Final diagnoses:  Upper respiratory tract infection, unspecified type  Wheezing  SOB (shortness of breath)   Discharge Instructions   None    ED Prescriptions     Medication Sig Dispense Auth. Provider   predniSONE (DELTASONE) 20 MG tablet  (Status: Discontinued) Take 2 tablets (40 mg total) by mouth daily with breakfast. 10 tablet Volney American, PA-C   azithromycin (ZITHROMAX) 250 MG tablet  (Status: Discontinued) Take 2 tabs day one, then 1 tab daily until complete 6 tablet Volney American, PA-C   azithromycin (ZITHROMAX) 250 MG tablet Take 2 tabs day one, then 1 tab daily until complete 6 tablet Volney American, PA-C   predniSONE (DELTASONE) 20 MG tablet Take 2 tablets (40 mg total) by mouth daily with breakfast. 10 tablet Volney American, PA-C      PDMP not reviewed this encounter.   Volney American, [place] 03/04/21 [phone]

## 2021-03-13 ENCOUNTER — Other Ambulatory Visit: Payer: BC Managed Care – PPO

## 2021-03-13 ENCOUNTER — Telehealth: Payer: Self-pay

## 2021-03-13 ENCOUNTER — Ambulatory Visit: Payer: BC Managed Care – PPO | Admitting: Nurse Practitioner

## 2021-03-13 ENCOUNTER — Telehealth: Payer: Self-pay | Admitting: Internal Medicine

## 2021-03-13 MED ORDER — BUDESONIDE-FORMOTEROL FUMARATE 160-4.5 MCG/ACT IN AERO: INHALATION_SPRAY | RESPIRATORY_TRACT | 3 refills | Status: DC

## 2021-03-13 NOTE — Telephone Encounter (Signed)
Pt called and would like a refill on hydrocodone

## 2021-03-13 NOTE — Telephone Encounter (Signed)
No Symbicort samples available.  Informed patient and rx sent to pharmacy.

## 2021-03-14 MED ORDER — HYDROCODONE-ACETAMINOPHEN 5-325 MG PO TABS: 1.0000 | ORAL_TABLET | Freq: Four times a day (QID) | ORAL | 0 refills | Status: DC | PRN

## 2021-03-16 ENCOUNTER — Encounter: Payer: Self-pay | Admitting: Orthopedic Surgery

## 2021-03-16 ENCOUNTER — Other Ambulatory Visit: Payer: Self-pay

## 2021-03-16 ENCOUNTER — Ambulatory Visit (INDEPENDENT_AMBULATORY_CARE_PROVIDER_SITE_OTHER): Payer: BC Managed Care – PPO | Admitting: Orthopedic Surgery

## 2021-03-16 DIAGNOSIS — L97521 Non-pressure chronic ulcer of other part of left foot limited to breakdown of skin: Secondary | ICD-10-CM

## 2021-03-16 DIAGNOSIS — M14672 Charcot's joint, left ankle and foot: Secondary | ICD-10-CM

## 2021-03-21 ENCOUNTER — Encounter: Payer: Self-pay | Admitting: Orthopedic Surgery

## 2021-03-21 NOTE — Progress Notes (Signed)
Office Visit Note   Patient: Stephen Whitaker           Date of Birth: 12-29-1955           MRN: [id number] Visit Date: 03/16/2021              Requested by: [name], NP [address] [place],  [place] [address] PCP: [name], NP  Chief Complaint  Patient presents with   Left Foot - Pain      HPI: Patient is a 65 year old gentleman who presents with chronic Wagner grade 1 ulcer left foot with Charcot arthropathy.  Assessment & Plan: Visit Diagnoses:  1. Non-pressure chronic ulcer of other part of left foot limited to breakdown of skin ([place])   2. Charcot's joint, left ankle and foot     Plan: We will provide patient with a new postoperative shoe a felt relieving donut to unload pressure continue with wound care dressing changes.  Follow-Up Instructions: Return in about 3 weeks (around 04/06/2021).   Ortho Exam  Patient is alert, oriented, no adenopathy, well-dressed, normal affect, normal respiratory effort. Examination patient has a Wagner grade 1 ulcer with Charcot rocker-bottom deformity left foot he has a pronated valgus foot without cellulitis or drainage no evidence of active Charcot process.  Imaging: No results found.   Labs: Lab Results  Component Value Date   HGBA1C 5.6 08/10/2014   ESRSEDRATE 27 (H) 07/05/2015   ESRSEDRATE 80 (H) 01/21/2015   CRP 1.8 (H) 07/05/2015   LABURIC 5.9 07/28/2020   LABURIC 5.4 02/19/2018   LABURIC 6.0 08/10/2014   REPTSTATUS 01/22/2015 FINAL 01/21/2015   CULT NO GROWTH Performed at [facility]  01/21/2015     Lab Results  Component Value Date   ALBUMIN 2.6 (L) 01/31/2021   ALBUMIN 3.9 06/26/2019   ALBUMIN 3.9 10/22/2018    Lab Results  Component Value Date   MG 1.5 (L) 02/22/2021   MG 1.5 (L) 10/24/2020   MG 1.8 01/21/2015   Lab Results  Component Value Date   VD25OH 32.4 01/23/2017    No results found for: PREALBUMIN CBC EXTENDED Latest Ref Rng & Units 01/31/2021 12/12/2020  12/12/2020  WBC 4.0 - 10.5 K/uL 2.4(L) - -  RBC 4.22 - 5.81 MIL/uL 4.95 - -  HGB 13.0 - 17.0 g/dL 12.9(L) 12.2(L) 12.6(L)  HCT 39.0 - 52.0 % 41.5 36.0(L) 37.0(L)  PLT 150 - 400 K/uL 241 - -  NEUTROABS 1.7 - 7.7 K/uL 1.6(L) - -  LYMPHSABS 0.7 - 4.0 K/uL 0.5(L) - -     There is no height or weight on file to calculate BMI.  Orders:  No orders of the defined types were placed in this encounter.  No orders of the defined types were placed in this encounter.    Procedures: No procedures performed  Clinical Data: No additional findings.  ROS:  All other systems negative, except as noted in the HPI. Review of Systems  Objective: Vital Signs: There were no vitals taken for this visit.  Specialty Comments:  No specialty comments available.  PMFS History: Patient Active Problem List   Diagnosis Date Noted   Fatigue 02/22/2021   Nonadherence to medication 02/15/2021   Asthma 01/30/2021   Other adverse food reactions, not elsewhere classified, subsequent encounter 01/30/2021   LVH (left ventricular hypertrophy) 01/04/2021   Acute on chronic systolic heart failure ([place]) 12/08/2020   HFrEF (heart failure with reduced ejection fraction) ([place]) 10/14/2020   Nonspecific  abnormal electrocardiogram (ECG) (EKG) 10/14/2020   Atrial tachycardia ([place]) 10/14/2020   Non-pressure chronic ulcer of other part of left foot limited to breakdown of skin ([place]) 10/12/2020   Leg swelling 10/06/2020   DOE (dyspnea on exertion) 10/06/2020   Thrombocytopenia ([place]) 06/26/2019   Acquired absence of other right toe(s) ([place]) 02/19/2018   Boil of buttock 10/24/2016   Amputated toe of left foot ([place]) 10/19/2016   Amputated toe, right ([place]) 06/13/2016   Chronic bronchitis ([place]) 11/24/2015   Other pancytopenia ([place]) 01/24/2015   Chronic pancreatitis ([place]) 01/24/2015   Blood poisoning    Neutropenia ([place])    Iron deficiency anemia due to chronic blood loss    Leukopenia    Sepsis ([place]) 01/21/2015    Leucopenia 01/21/2015   Anemia 01/21/2015   Syncope 01/21/2015   Diarrhea 01/21/2015   Hypokalemia 01/21/2015   Loss of consciousness ([place])    Eczema 10/21/2014   Essential hypertension 10/21/2014   Vasovagal syncope 10/21/2014   Allergic conjunctivitis of both eyes 08/10/2014   Other chronic pancreatitis ([place]) 08/10/2014   COLD (chronic obstructive lung disease) ([place]) 08/10/2014   Chronic ethmoidal sinusitis 08/10/2014   Bronchitis 10/17/2010   PLANTAR FASCIITIS, LEFT 10/17/2010   OTHER NEUTROPENIA 04/21/2010   CALLUS, TOE 03/15/2010   LEUKOPENIA, MILD 02/20/2010   LIVER FUNCTION TESTS, ABNORMAL, HX OF 02/16/2010   DENTAL CARIES 02/15/2010   ONYCHOMYCOSIS 07/05/2009   ALLERGIC URTICARIA 07/05/2009   PAIN IN LIMB 07/05/2009   ERECTILE DYSFUNCTION 05/25/2008   INFLUENZA 05/25/2008   VIRAL INFECTION 10/01/2007   HSV 05/02/2007   CONDYLOMA ACUMINATUM 05/02/2007   HYPERTENSION 05/02/2007   Other allergic rhinitis 05/02/2007   Chronic asthma, mild persistent, uncomplicated [id number]   Gastroesophageal reflux disease without esophagitis 05/02/2007   PANCREATITIS 05/02/2007   Past Medical History:  Diagnosis Date   Abnormal liver function tests    Arthritis    Boil of buttock    COPD (chronic obstructive pulmonary disease) (HCC)    Chronic Bronchitis.Advair inhaler    Dyspnea    GERD (gastroesophageal reflux disease)    takes Protonix daily   Gout    takes Allopurinol daily   Hemorrhoids    Hypertension    takes HCTZ daily   Hypomagnesemia    Leg ulcer (HCC)    Mild mitral regurgitation    Pancreatitis    10 years ago   Pneumonia 2016   Tricuspid regurgitation    Tubular adenoma of colon     Family History  Adopted: Yes  Problem Relation Age of Onset   Cancer Sister 75       unknown origin     Past Surgical History:  Procedure Laterality Date   AMPUTATION Left 07/14/2014   Procedure: 2nd Toe Amputation;  Surgeon: [name], MD;  Location: [place];   Service: Orthopedics;  Laterality: Left;   AMPUTATION Left 11/16/2015   Procedure: Left Great Toe Amputation at Metatarsophalangeal Joint;  Surgeon: [name], MD;  Location: [place];  Service: Orthopedics;  Laterality: Left;   AMPUTATION Right 06/13/2016   Procedure: RIGHT 2nd Toe Amputation;  Surgeon: [name], MD;  Location: [place];  Service: Orthopedics;  Laterality: Right;   AMPUTATION Left 10/19/2016   Procedure: Amputation Left 3rd Toe;  Surgeon: [name], MD;  Location: [place];  Service: Orthopedics;  Laterality: Left;   RIGHT/LEFT HEART CATH AND CORONARY ANGIOGRAPHY N/A 12/12/2020   Procedure: RIGHT/LEFT HEART CATH AND CORONARY ANGIOGRAPHY;  Surgeon: Quay Burow  J, MD;  Location: [facility];  Service: Cardiovascular;  Laterality: N/A;   TOE AMPUTATION Left 11/16/2015   left great toe    TOE AMPUTATION Right 06/13/2016   TONSILLECTOMY     Social History   Occupational History   Not on file  Tobacco Use   Smoking status: Never   Smokeless tobacco: Never  Vaping Use   Vaping Use: Never used  Substance and Sexual Activity   Alcohol use: Yes    Alcohol/week: 0.0 standard drinks    Comment: rarely   Drug use: No   Sexual activity: Not Currently

## 2021-03-22 ENCOUNTER — Other Ambulatory Visit: Payer: BC Managed Care – PPO

## 2021-03-23 ENCOUNTER — Other Ambulatory Visit: Payer: BC Managed Care – PPO

## 2021-03-24 ENCOUNTER — Other Ambulatory Visit: Payer: BC Managed Care – PPO | Admitting: *Deleted

## 2021-03-24 ENCOUNTER — Other Ambulatory Visit: Payer: Self-pay

## 2021-03-24 DIAGNOSIS — Z13220 Encounter for screening for lipoid disorders: Secondary | ICD-10-CM | POA: Diagnosis not present

## 2021-03-24 DIAGNOSIS — I5020 Unspecified systolic (congestive) heart failure: Secondary | ICD-10-CM

## 2021-03-25 LAB — BASIC METABOLIC PANEL
BUN/Creatinine Ratio: 20 (ref 10–24)
BUN: 22 mg/dL (ref 8–27)
CO2: 25 mmol/L (ref 20–29)
Calcium: 8.5 mg/dL — ABNORMAL LOW (ref 8.6–10.2)
Chloride: 102 mmol/L (ref 96–106)
Creatinine, Ser: 1.12 mg/dL (ref 0.76–1.27)
Glucose: 107 mg/dL — ABNORMAL HIGH (ref 65–99)
Potassium: 3.6 mmol/L (ref 3.5–5.2)
Sodium: 141 mmol/L (ref 134–144)
eGFR: 72 mL/min/{1.73_m2} (ref >59)

## 2021-03-25 LAB — LIPID PANEL
Chol/HDL Ratio: 2.8 ratio (ref 0.0–5.0)
Cholesterol, Total: 133 mg/dL (ref 100–199)
HDL: 47 mg/dL (ref >39)
LDL Chol Calc (NIH): 70 mg/dL (ref 0–99)
Triglycerides: 81 mg/dL (ref 0–149)
VLDL Cholesterol Cal: 16 mg/dL (ref 5–40)

## 2021-03-27 NOTE — Progress Notes (Addendum)
Error

## 2021-03-28 NOTE — Progress Notes (Deleted)
Patient ID: Stephen Whitaker                 DOB: September 23, 1955                      MRN: [id number]     HPI: Stephen Whitaker is a 65 y.o. male referred by Dr. Gasper Sells to pharmacy clinic for HF medication management. PMH is significant for HF, HTN, COPD, GERD, moderate TR, gout, SVT, asthma, and chronic pancreatitis. Most recent LVEF 20-25% on 12/08/20 (previously 55-60% in 2016). Cardiac cath 12/12/20 showed clean coronary arteries with nonischemic cardiomyopathy.   Pt last seen in HF clinic on 02/14/21 and BP was 138/76, HR 94. Reported non-compliance with Entresto, torsemide, metoprolol due to frequent nausea. Discussed that nausea unlikely to be from CHF medications and encouraged him to discuss with PCP. Emphasized importance of adherence. Last visit with Dr. Gasper Sells 02/22/21, BP was 118/80, HR 87. Jardiance 10 mg daily was started. F/u bmet on 8/5 showed Scr improved from 1.81 to 1.12 on 7/6, and K was 3.6.   Today, *** Tolerating jardiance?  Jardiance copay? What insurance? Clinic BP? Compliance??? Took meds this today? When do you take your meds? Dizziness, headaches, SOB, blurred vision? History of swelling? Weight changes? -clinic weight = -checking weight at home?? -salt, fluid restrictions (no more than 2L a day) Home BP logs?  Talk about additional BP/CHF therapy if needed  Could increase metop to 50 and entresto to 49/51 [name] in the future Diet/exercise     Current CHF/HTN meds:  Entresto 24-26 mg twice daily Jardiance '10mg'$  daily Metoprolol succinate 25 mg daily (PM) Torsemide 40 mg daily  Previously tried medications: furosemide (nausea/diarrhea and fatigue)  BP goal: <130/80 mmHg   Family History: Cancer (age of onset: 74) in his sister. He was adopted.   Social History: denies tobacco use  Home BP readings: none  Wt Readings from Last 3 Encounters:  02/22/21 201 lb (91.2 kg)  02/14/21 205 lb (93 kg)  01/30/21 204 lb (92.5 kg)   BP Readings  from Last 3 Encounters:  02/28/21 100/76  02/22/21 118/80  02/14/21 138/76   Pulse Readings from Last 3 Encounters:  02/28/21 68  02/22/21 87  02/14/21 94    Renal function: CrCl cannot be calculated (Unknown ideal weight.).  Past Medical History:  Diagnosis Date   Abnormal liver function tests    Arthritis    Boil of buttock    COPD (chronic obstructive pulmonary disease) (HCC)    Chronic Bronchitis.Advair inhaler    Dyspnea    GERD (gastroesophageal reflux disease)    takes Protonix daily   Gout    takes Allopurinol daily   Hemorrhoids    Hypertension    takes HCTZ daily   Hypomagnesemia    Leg ulcer (HCC)    Mild mitral regurgitation    Pancreatitis    10 years ago   Pneumonia 2016   Tricuspid regurgitation    Tubular adenoma of colon     Current Outpatient Medications on File Prior to Visit  Medication Sig Dispense Refill   albuterol (VENTOLIN HFA) 108 (90 Base) MCG/ACT inhaler Inhale 2 puffs into the lungs every 4 (four) hours as needed for wheezing or shortness of breath. 54 g 1   allopurinol (ZYLOPRIM) 300 MG tablet Take 1 tablet (300 mg total) by mouth daily. 30 tablet 0   azelastine (ASTELIN) 0.1 % nasal spray Place 1-2 sprays into both nostrils 2 (two)  times daily as needed (nasal drainage). Use in each nostril as directed 30 mL 5   azithromycin (ZITHROMAX) 250 MG tablet Take 2 tabs day one, then 1 tab daily until complete 6 tablet 0   Bioflavonoid Products (ESTER-C) TABS Take 1 tablet by mouth daily.     Blood Pressure Monitoring DEVI 1 Units by Does not apply route daily. Measure blood pressure once a day 1 Units 0   budesonide-formoterol (SYMBICORT) 160-4.5 MCG/ACT inhaler USE 2 INHALATIONS TWICE A DAY 30.6 g 3   Digestive Enzymes (PAPAYA AND ENZYMES) CHEW Chew 3 tablets by mouth as needed (as needed for spicy food, indigestion, and heart burn).     empagliflozin (JARDIANCE) 10 MG TABS tablet Take 1 tablet (10 mg total) by mouth daily before breakfast. 90  tablet 3   EPINEPHrine (AUVI-Q) 0.3 mg/0.3 mL IJ SOAJ injection Inject 0.3 mg into the muscle as needed for anaphylaxis. 1 each 1   fluticasone (FLONASE) 50 MCG/ACT nasal spray Place 1 spray into both nostrils 2 (two) times daily. 49 g 11   HYDROcodone-acetaminophen (NORCO/VICODIN) 5-325 MG tablet Take 1 tablet by mouth every 6 (six) hours as needed for moderate pain. 20 tablet 0   Lactase (LACTAID FAST ACT PO) Take 2 tablets by mouth as needed (with dairy food).     loratadine (CLARITIN) 10 MG tablet Take 10 mg by mouth daily.     Magnesium Oxide 400 MG CAPS Take 2 capsules (800 mg total) by mouth daily. 180 capsule 3   meloxicam (MOBIC) 7.5 MG tablet Take 1 tablet by mouth once daily 30 tablet 0   metoprolol succinate (TOPROL XL) 25 MG 24 hr tablet Take 1 tablet (25 mg total) by mouth daily. 90 tablet 3   MITIGARE 0.6 MG CAPS Take 1 capsule by mouth as needed.     montelukast (SINGULAIR) 10 MG tablet TAKE 1 TABLET AT BEDTIME 90 tablet 3   Olopatadine HCl 0.2 % SOLN Apply 1 drop to eye daily as needed (itchy/watery eyes). 2.5 mL 5   pantoprazole (PROTONIX) 40 MG tablet TAKE ONE TABLET BY MOUTH 30 TO 60 MINUTES BEFORE YOUR FIRST AND LAST MEALS OF THE DAY 180 tablet 3   predniSONE (DELTASONE) 20 MG tablet Take 2 tablets (40 mg total) by mouth daily with breakfast. 10 tablet 0   Probiotic Product (SUPER PROBIOTIC DIGESTIVE) CAPS Take 1 tablet by mouth as needed (stomach upset). Take as needed with meals     sacubitril-valsartan (ENTRESTO) 24-26 MG Take 1 tablet by mouth 2 (two) times daily. 60 tablet 2   SPIRULINA PO Take 1 capsule by mouth 3 (three) times daily.     torsemide (DEMADEX) 20 MG tablet Take 2 tablets (40 mg total) by mouth daily. 60 tablet 5   No current facility-administered medications on file prior to visit.    Allergies  Allergen Reactions   Ciprofloxacin Rash and Other (See Comments)    SYNCOPE    Shellfish Allergy Anaphylaxis   Sulfa Antibiotics Other (See Comments)     SYNCOPE DIZZINESS      Assessment/Plan:  1. CHF - BP above goal <130/80 mmHg. Medication adherence is poor as patient misses medications due to persistent nausea. Patient's GI issues have been persistent now even when switching some of his CHF medications (furosemide to torsemide, metoprolol tartrate to succinate XL, and discontinuing amlodipine). Additionally, pt has been taking various OTC supplements containing digestive enzymes and acid relief for GI discomfort. Encouraged patient to discuss GI related issues  with PCP as there is a low probability his current HF medications are causing these symptoms. Encouraged patient to avoid trigger foods that may exacerbate these symptoms. Patient verbalized understanding. Extensively discussed the importance of medication adherence to improve his ejection fraction, reduce risk of cardiovascular events, and improve symptoms of SOB and edema. Encouraged patient to start using his pill box to help organize his medications. Emphasized importance of taking CHF meds as directed including Entresto 24-26 mg twice daily, metoprolol succinate 25 mg daily, and torsemide 40 mg daily. Follow-up appointment scheduled with Dr. Gasper Sells in ~1 week and in HF clinic in 5-6 weeks. Prefer to add on Jardiance '10mg'$  daily for added CHF benefit at next visit once pt is adherent to current meds (prior authorization already approved and confirmed copay would be $0).

## 2021-03-29 ENCOUNTER — Ambulatory Visit: Payer: BC Managed Care – PPO | Admitting: Pharmacist

## 2021-04-06 ENCOUNTER — Ambulatory Visit (INDEPENDENT_AMBULATORY_CARE_PROVIDER_SITE_OTHER): Payer: BC Managed Care – PPO | Admitting: Orthopedic Surgery

## 2021-04-06 ENCOUNTER — Other Ambulatory Visit: Payer: Self-pay

## 2021-04-06 ENCOUNTER — Encounter: Payer: Self-pay | Admitting: Orthopedic Surgery

## 2021-04-06 ENCOUNTER — Ambulatory Visit: Payer: Self-pay

## 2021-04-06 DIAGNOSIS — M14672 Charcot's joint, left ankle and foot: Secondary | ICD-10-CM

## 2021-04-06 DIAGNOSIS — M79672 Pain in left foot: Secondary | ICD-10-CM | POA: Diagnosis not present

## 2021-04-06 DIAGNOSIS — L97521 Non-pressure chronic ulcer of other part of left foot limited to breakdown of skin: Secondary | ICD-10-CM

## 2021-04-06 MED ORDER — HYDROCODONE-ACETAMINOPHEN 5-325 MG PO TABS: 1.0000 | ORAL_TABLET | Freq: Four times a day (QID) | ORAL | 0 refills | Status: DC | PRN

## 2021-04-06 MED ORDER — DOXYCYCLINE HYCLATE 100 MG PO TABS: 100.0000 mg | ORAL_TABLET | Freq: Two times a day (BID) | ORAL | 0 refills | Status: DC

## 2021-04-06 NOTE — Progress Notes (Signed)
Office Visit Note   Patient: Stephen Whitaker           Date of Birth: 01-15-1956           MRN: [id number] Visit Date: 04/06/2021              Requested by: [name], NP [address] [place],  [place] [address] PCP: [name], NP  Chief Complaint  Patient presents with   Left Foot - Follow-up    Left foot ulcer       HPI: Patient is a 65 year old gentleman with Charcot collapse of the left foot he is status post great toe second toe and third toe amputations he has had a Wagner grade 1 ulcer beneath the first metatarsal head and presents at this time with a new ulcer medially which is painful he states he has had drainage.  Patient is currently wearing a postoperative shoe.  Assessment & Plan: Visit Diagnoses:  1. Pain in left foot   2. Non-pressure chronic ulcer of other part of left foot limited to breakdown of skin (HCC)   3. Charcot's joint, left ankle and foot     Plan: We will start him on doxycycline 100 mg twice a day he will wash this with soap and water daily follow-up on Monday.  Discussed that if we do not have significant improvement by Monday we may need to consider surgery.  Follow-Up Instructions: Return in about 1 week (around 04/13/2021) for Follow-up Monday.Manson Passey Exam  Patient is alert, oriented, no adenopathy, well-dressed, normal affect, normal respiratory effort. Examination patient does not have any cellulitis in the foot the ulcer beneath the first metatarsal head is superficial with 5 mm in diameter 1 mm deep with healthy granulation tissue this ulcer is stable he has a new ulcer over the medial Charcot collapse over the navicular.  The ulcer is 3 x 5 mm and 1 mm deep there is no purulent drainage no exposed bone it is tender to palpation.  There is no ascending cellulitis.  Review of the radiographs shows no destructive bony changes at the talonavicular joint.  Imaging: No results found. No images are attached to the  encounter.  Labs: Lab Results  Component Value Date   HGBA1C 5.6 08/10/2014   ESRSEDRATE 27 (H) 07/05/2015   ESRSEDRATE 80 (H) 01/21/2015   CRP 1.8 (H) 07/05/2015   LABURIC 5.9 07/28/2020   LABURIC 5.4 02/19/2018   LABURIC 6.0 08/10/2014   REPTSTATUS 01/22/2015 FINAL 01/21/2015   CULT NO GROWTH Performed at [facility]  01/21/2015     Lab Results  Component Value Date   ALBUMIN 2.6 (L) 01/31/2021   ALBUMIN 3.9 06/26/2019   ALBUMIN 3.9 10/22/2018    Lab Results  Component Value Date   MG 1.5 (L) 02/22/2021   MG 1.5 (L) 10/24/2020   MG 1.8 01/21/2015   Lab Results  Component Value Date   VD25OH 32.4 01/23/2017    No results found for: PREALBUMIN CBC EXTENDED Latest Ref Rng & Units 01/31/2021 12/12/2020 12/12/2020  WBC 4.0 - 10.5 K/uL 2.4(L) - -  RBC 4.22 - 5.81 MIL/uL 4.95 - -  HGB 13.0 - 17.0 g/dL 12.9(L) 12.2(L) 12.6(L)  HCT 39.0 - 52.0 % 41.5 36.0(L) 37.0(L)  PLT 150 - 400 K/uL 241 - -  NEUTROABS 1.7 - 7.7 K/uL 1.6(L) - -  LYMPHSABS 0.7 - 4.0 K/uL 0.5(L) - -     There is no height  or weight on file to calculate BMI.  Orders:  Orders Placed This Encounter  Procedures   XR Foot 2 Views Left   Meds ordered this encounter  Medications   doxycycline (VIBRA-TABS) 100 MG tablet    Sig: Take 1 tablet (100 mg total) by mouth 2 (two) times daily.    Dispense:  60 tablet    Refill:  0     Procedures: No procedures performed  Clinical Data: No additional findings.  ROS:  All other systems negative, except as noted in the HPI. Review of Systems  Objective: Vital Signs: There were no vitals taken for this visit.  Specialty Comments:  No specialty comments available.  PMFS History: Patient Active Problem List   Diagnosis Date Noted   Fatigue 02/22/2021   Nonadherence to medication 02/15/2021   Asthma 01/30/2021   Other adverse food reactions, not elsewhere classified, subsequent encounter 01/30/2021   LVH (left ventricular hypertrophy)  01/04/2021   Acute on chronic systolic heart failure ([place]) 12/08/2020   HFrEF (heart failure with reduced ejection fraction) ([place]) 10/14/2020   Nonspecific abnormal electrocardiogram (ECG) (EKG) 10/14/2020   Atrial tachycardia ([place]) 10/14/2020   Non-pressure chronic ulcer of other part of left foot limited to breakdown of skin ([place]) 10/12/2020   Leg swelling 10/06/2020   DOE (dyspnea on exertion) 10/06/2020   Thrombocytopenia ([place]) 06/26/2019   Acquired absence of other right toe(s) ([place]) 02/19/2018   Boil of buttock 10/24/2016   Amputated toe of left foot ([place]) 10/19/2016   Amputated toe, right ([place]) 06/13/2016   Chronic bronchitis ([place]) 11/24/2015   Other pancytopenia ([place]) 01/24/2015   Chronic pancreatitis ([place]) 01/24/2015   Blood poisoning    Neutropenia (HCC)    Iron deficiency anemia due to chronic blood loss    Leukopenia    Sepsis ([place]) 01/21/2015   Leucopenia 01/21/2015   Anemia 01/21/2015   Syncope 01/21/2015   Diarrhea 01/21/2015   Hypokalemia 01/21/2015   Loss of consciousness ([place])    Eczema 10/21/2014   Essential hypertension 10/21/2014   Vasovagal syncope 10/21/2014   Allergic conjunctivitis of both eyes 08/10/2014   Other chronic pancreatitis ([place]) 08/10/2014   COLD (chronic obstructive lung disease) ([place]) 08/10/2014   Chronic ethmoidal sinusitis 08/10/2014   Bronchitis 10/17/2010   PLANTAR FASCIITIS, LEFT 10/17/2010   OTHER NEUTROPENIA 04/21/2010   CALLUS, TOE 03/15/2010   LEUKOPENIA, MILD 02/20/2010   LIVER FUNCTION TESTS, ABNORMAL, HX OF 02/16/2010   DENTAL CARIES 02/15/2010   ONYCHOMYCOSIS 07/05/2009   ALLERGIC URTICARIA 07/05/2009   PAIN IN LIMB 07/05/2009   ERECTILE DYSFUNCTION 05/25/2008   INFLUENZA 05/25/2008   VIRAL INFECTION 10/01/2007   HSV 05/02/2007   CONDYLOMA ACUMINATUM 05/02/2007   HYPERTENSION 05/02/2007   Other allergic rhinitis 05/02/2007   Chronic asthma, mild persistent, uncomplicated Q000111Q   Gastroesophageal reflux disease  without esophagitis 05/02/2007   PANCREATITIS 05/02/2007   Past Medical History:  Diagnosis Date   Abnormal liver function tests    Arthritis    Boil of buttock    COPD (chronic obstructive pulmonary disease) (HCC)    Chronic Bronchitis.Advair inhaler    Dyspnea    GERD (gastroesophageal reflux disease)    takes Protonix daily   Gout    takes Allopurinol daily   Hemorrhoids    Hypertension    takes HCTZ daily   Hypomagnesemia    Leg ulcer (HCC)    Mild mitral regurgitation    Pancreatitis    10 years ago   Pneumonia 2016  Tricuspid regurgitation    Tubular adenoma of colon     Family History  Adopted: Yes  Problem Relation Age of Onset   Cancer Sister 54       unknown origin     Past Surgical History:  Procedure Laterality Date   AMPUTATION Left 07/14/2014   Procedure: 2nd Toe Amputation;  Surgeon: [name], MD;  Location: [facility];  Service: Orthopedics;  Laterality: Left;   AMPUTATION Left 11/16/2015   Procedure: Left Great Toe Amputation at Metatarsophalangeal Joint;  Surgeon: [name], MD;  Location: [facility];  Service: Orthopedics;  Laterality: Left;   AMPUTATION Right 06/13/2016   Procedure: RIGHT 2nd Toe Amputation;  Surgeon: [name], MD;  Location: [facility];  Service: Orthopedics;  Laterality: Right;   AMPUTATION Left 10/19/2016   Procedure: Amputation Left 3rd Toe;  Surgeon: [name], MD;  Location: [facility];  Service: Orthopedics;  Laterality: Left;   RIGHT/LEFT HEART CATH AND CORONARY ANGIOGRAPHY N/A 12/12/2020   Procedure: RIGHT/LEFT HEART CATH AND CORONARY ANGIOGRAPHY;  Surgeon: Lorretta Harp, MD;  Location: [facility];  Service: Cardiovascular;  Laterality: N/A;   TOE AMPUTATION Left 11/16/2015   left great toe    TOE AMPUTATION Right 06/13/2016   TONSILLECTOMY     Social History   Occupational History   Not on file  Tobacco Use   Smoking status: Never   Smokeless tobacco: Never  Vaping Use   Vaping Use: Never used  Substance  and Sexual Activity   Alcohol use: Yes    Alcohol/week: 0.0 standard drinks    Comment: rarely   Drug use: No   Sexual activity: Not Currently

## 2021-04-13 ENCOUNTER — Ambulatory Visit (INDEPENDENT_AMBULATORY_CARE_PROVIDER_SITE_OTHER): Payer: BC Managed Care – PPO | Admitting: Orthopedic Surgery

## 2021-04-13 DIAGNOSIS — M86272 Subacute osteomyelitis, left ankle and foot: Secondary | ICD-10-CM | POA: Diagnosis not present

## 2021-04-13 DIAGNOSIS — M14672 Charcot's joint, left ankle and foot: Secondary | ICD-10-CM | POA: Diagnosis not present

## 2021-04-14 ENCOUNTER — Encounter: Payer: Self-pay | Admitting: Orthopedic Surgery

## 2021-04-14 NOTE — Progress Notes (Signed)
Office Visit Note   Patient: Stephen Whitaker           Date of Birth: [birth year]           MRN: [id number] Visit Date: 04/13/2021              Requested by: [name], NP [place],  [place] [address] PCP: [name], NP  No chief complaint on file.     HPI: Patient is a 65 year old gentleman who presents in follow-up for ulceration left foot.  Patient has been on doxycycline and daily wound care.  Patient states the ulcer is larger with necrotic tissue increased depth and odor as well as increased pain and warmth.  Assessment & Plan: Visit Diagnoses:  1. Charcot's joint, left ankle and foot   2. Subacute osteomyelitis, left ankle and foot ([place])     Plan: We will plan for a left transtibial amputation due to failure of conservative treatment.  Anticipate discharge to skilled nursing.  Follow-Up Instructions: Return in about 2 weeks (around 04/27/2021).   Ortho Exam  Patient is alert, oriented, no adenopathy, well-dressed, normal affect, normal respiratory effort. Examination patient has a good dorsalis pedis pulse there is swelling of the foot and leg with pitting edema.  The ulcer is necrotic approximately 10 mm in diameter and 2 cm deep.  There is exposed bone.  Imaging: No results found. No images are attached to the encounter.  Labs: Lab Results  Component Value Date   HGBA1C 5.6 08/10/2014   ESRSEDRATE 27 (H) 07/05/2015   ESRSEDRATE 80 (H) 01/21/2015   CRP 1.8 (H) 07/05/2015   LABURIC 5.9 07/28/2020   LABURIC 5.4 02/19/2018   LABURIC 6.0 08/10/2014   REPTSTATUS 01/22/2015 FINAL 01/21/2015   CULT NO GROWTH Performed at [facility]  01/21/2015     Lab Results  Component Value Date   ALBUMIN 2.6 (L) 01/31/2021   ALBUMIN 3.9 06/26/2019   ALBUMIN 3.9 10/22/2018    Lab Results  Component Value Date   MG 1.5 (L) 02/22/2021   MG 1.5 (L) 10/24/2020   MG 1.8 01/21/2015   Lab Results  Component Value Date   VD25OH  32.4 01/23/2017    No results found for: PREALBUMIN CBC EXTENDED Latest Ref Rng & Units 01/31/2021 12/12/2020 12/12/2020  WBC 4.0 - 10.5 K/uL 2.4(L) - -  RBC 4.22 - 5.81 MIL/uL 4.95 - -  HGB 13.0 - 17.0 g/dL 12.9(L) 12.2(L) 12.6(L)  HCT 39.0 - 52.0 % 41.5 36.0(L) 37.0(L)  PLT 150 - 400 K/uL 241 - -  NEUTROABS 1.7 - 7.7 K/uL 1.6(L) - -  LYMPHSABS 0.7 - 4.0 K/uL 0.5(L) - -     There is no height or weight on file to calculate BMI.  Orders:  No orders of the defined types were placed in this encounter.  No orders of the defined types were placed in this encounter.    Procedures: No procedures performed  Clinical Data: No additional findings.  ROS:  All other systems negative, except as noted in the HPI. Review of Systems  Objective: Vital Signs: There were no vitals taken for this visit.  Specialty Comments:  No specialty comments available.  PMFS History: Patient Active Problem List   Diagnosis Date Noted   Fatigue 02/22/2021   Nonadherence to medication 02/15/2021   Asthma 01/30/2021   Other adverse food reactions, not elsewhere classified, subsequent encounter 01/30/2021   LVH (left ventricular hypertrophy) 01/04/2021  Acute on chronic systolic heart failure ([place]) 12/08/2020   HFrEF (heart failure with reduced ejection fraction) ([place]) 10/14/2020   Nonspecific abnormal electrocardiogram (ECG) (EKG) 10/14/2020   Atrial tachycardia ([place]) 10/14/2020   Non-pressure chronic ulcer of other part of left foot limited to breakdown of skin ([place]) 10/12/2020   Leg swelling 10/06/2020   DOE (dyspnea on exertion) 10/06/2020   Thrombocytopenia ([place]) 06/26/2019   Acquired absence of other right toe(s) ([place]) 02/19/2018   Boil of buttock 10/24/2016   Amputated toe of left foot ([place]) 10/19/2016   Amputated toe, right ([place]) 06/13/2016   Chronic bronchitis ([place]) 11/24/2015   Other pancytopenia ([place]) 01/24/2015   Chronic pancreatitis ([place]) 01/24/2015   Blood poisoning     Neutropenia ([place])    Iron deficiency anemia due to chronic blood loss    Leukopenia    Sepsis ([place]) 01/21/2015   Leucopenia 01/21/2015   Anemia 01/21/2015   Syncope 01/21/2015   Diarrhea 01/21/2015   Hypokalemia 01/21/2015   Loss of consciousness ([place])    Eczema 10/21/2014   Essential hypertension 10/21/2014   Vasovagal syncope 10/21/2014   Allergic conjunctivitis of both eyes 08/10/2014   Other chronic pancreatitis ([place]) 08/10/2014   COLD (chronic obstructive lung disease) ([place]) 08/10/2014   Chronic ethmoidal sinusitis 08/10/2014   Bronchitis 10/17/2010   PLANTAR FASCIITIS, LEFT 10/17/2010   OTHER NEUTROPENIA 04/21/2010   CALLUS, TOE 03/15/2010   LEUKOPENIA, MILD 02/20/2010   LIVER FUNCTION TESTS, ABNORMAL, HX OF 02/16/2010   DENTAL CARIES 02/15/2010   ONYCHOMYCOSIS 07/05/2009   ALLERGIC URTICARIA 07/05/2009   PAIN IN LIMB 07/05/2009   ERECTILE DYSFUNCTION 05/25/2008   INFLUENZA 05/25/2008   VIRAL INFECTION 10/01/2007   HSV 05/02/2007   CONDYLOMA ACUMINATUM 05/02/2007   HYPERTENSION 05/02/2007   Other allergic rhinitis 05/02/2007   Chronic asthma, mild persistent, uncomplicated Q000111Q   Gastroesophageal reflux disease without esophagitis 05/02/2007   PANCREATITIS 05/02/2007   Past Medical History:  Diagnosis Date   Abnormal liver function tests    Arthritis    Boil of buttock    COPD (chronic obstructive pulmonary disease) (HCC)    Chronic Bronchitis.Advair inhaler    Dyspnea    GERD (gastroesophageal reflux disease)    takes Protonix daily   Gout    takes Allopurinol daily   Hemorrhoids    Hypertension    takes HCTZ daily   Hypomagnesemia    Leg ulcer (HCC)    Mild mitral regurgitation    Pancreatitis    10 years ago   Pneumonia 2016   Tricuspid regurgitation    Tubular adenoma of colon     Family History  Adopted: Yes  Problem Relation Age of Onset   Cancer Sister 53       unknown origin     Past Surgical History:  Procedure Laterality Date    AMPUTATION Left 07/14/2014   Procedure: 2nd Toe Amputation;  Surgeon: [name], MD;  Location: [facility];  Service: Orthopedics;  Laterality: Left;   AMPUTATION Left 11/16/2015   Procedure: Left Great Toe Amputation at Metatarsophalangeal Joint;  Surgeon: [name], MD;  Location: [facility];  Service: Orthopedics;  Laterality: Left;   AMPUTATION Right 06/13/2016   Procedure: RIGHT 2nd Toe Amputation;  Surgeon: [name], MD;  Location: [facility];  Service: Orthopedics;  Laterality: Right;   AMPUTATION Left 10/19/2016   Procedure: Amputation Left 3rd Toe;  Surgeon: [name], MD;  Location: [facility];  Service: Orthopedics;  Laterality: Left;  RIGHT/LEFT HEART CATH AND CORONARY ANGIOGRAPHY N/A 12/12/2020   Procedure: RIGHT/LEFT HEART CATH AND CORONARY ANGIOGRAPHY;  Surgeon: Lorretta Harp, MD;  Location: [facility];  Service: Cardiovascular;  Laterality: N/A;   TOE AMPUTATION Left 11/16/2015   left great toe    TOE AMPUTATION Right 06/13/2016   TONSILLECTOMY     Social History   Occupational History   Not on file  Tobacco Use   Smoking status: Never   Smokeless tobacco: Never  Vaping Use   Vaping Use: Never used  Substance and Sexual Activity   Alcohol use: Yes    Alcohol/week: 0.0 standard drinks    Comment: rarely   Drug use: No   Sexual activity: Not Currently

## 2021-04-15 ENCOUNTER — Other Ambulatory Visit: Payer: Self-pay

## 2021-04-15 ENCOUNTER — Encounter (HOSPITAL_COMMUNITY): Payer: Self-pay | Admitting: Emergency Medicine

## 2021-04-15 ENCOUNTER — Emergency Department (HOSPITAL_COMMUNITY): Payer: BC Managed Care – PPO

## 2021-04-15 ENCOUNTER — Inpatient Hospital Stay (HOSPITAL_COMMUNITY)
Admission: EM | Admit: 2021-04-15 | Discharge: 2021-05-02 | DRG: 040 | Disposition: A | Discharge status: Skilled Nursing Facility | Payer: BC Managed Care – PPO | Attending: Internal Medicine | Admitting: Internal Medicine

## 2021-04-15 DIAGNOSIS — I82611 Acute embolism and thrombosis of superficial veins of right upper extremity: Secondary | ICD-10-CM | POA: Diagnosis present

## 2021-04-15 DIAGNOSIS — M86272 Subacute osteomyelitis, left ankle and foot: Secondary | ICD-10-CM | POA: Diagnosis not present

## 2021-04-15 DIAGNOSIS — D735 Infarction of spleen: Secondary | ICD-10-CM | POA: Diagnosis not present

## 2021-04-15 DIAGNOSIS — I5020 Unspecified systolic (congestive) heart failure: Secondary | ICD-10-CM | POA: Diagnosis not present

## 2021-04-15 DIAGNOSIS — L309 Dermatitis, unspecified: Secondary | ICD-10-CM | POA: Diagnosis not present

## 2021-04-15 DIAGNOSIS — R197 Diarrhea, unspecified: Secondary | ICD-10-CM | POA: Diagnosis not present

## 2021-04-15 DIAGNOSIS — Z599 Problem related to housing and economic circumstances, unspecified: Secondary | ICD-10-CM

## 2021-04-15 DIAGNOSIS — L089 Local infection of the skin and subcutaneous tissue, unspecified: Secondary | ICD-10-CM | POA: Diagnosis not present

## 2021-04-15 DIAGNOSIS — E872 Acidosis: Secondary | ICD-10-CM | POA: Diagnosis present

## 2021-04-15 DIAGNOSIS — B999 Unspecified infectious disease: Secondary | ICD-10-CM

## 2021-04-15 DIAGNOSIS — L89322 Pressure ulcer of left buttock, stage 2: Secondary | ICD-10-CM | POA: Diagnosis present

## 2021-04-15 DIAGNOSIS — T781XXD Other adverse food reactions, not elsewhere classified, subsequent encounter: Secondary | ICD-10-CM | POA: Diagnosis not present

## 2021-04-15 DIAGNOSIS — R5383 Other fatigue: Secondary | ICD-10-CM | POA: Diagnosis present

## 2021-04-15 DIAGNOSIS — I509 Heart failure, unspecified: Secondary | ICD-10-CM

## 2021-04-15 DIAGNOSIS — Z20822 Contact with and (suspected) exposure to covid-19: Secondary | ICD-10-CM | POA: Diagnosis not present

## 2021-04-15 DIAGNOSIS — N5089 Other specified disorders of the male genital organs: Secondary | ICD-10-CM | POA: Diagnosis present

## 2021-04-15 DIAGNOSIS — E11628 Type 2 diabetes mellitus with other skin complications: Secondary | ICD-10-CM

## 2021-04-15 DIAGNOSIS — Z7951 Long term (current) use of inhaled steroids: Secondary | ICD-10-CM

## 2021-04-15 DIAGNOSIS — G8918 Other acute postprocedural pain: Secondary | ICD-10-CM | POA: Diagnosis not present

## 2021-04-15 DIAGNOSIS — E739 Lactose intolerance, unspecified: Secondary | ICD-10-CM | POA: Diagnosis present

## 2021-04-15 DIAGNOSIS — J449 Chronic obstructive pulmonary disease, unspecified: Secondary | ICD-10-CM | POA: Diagnosis not present

## 2021-04-15 DIAGNOSIS — E1169 Type 2 diabetes mellitus with other specified complication: Secondary | ICD-10-CM | POA: Diagnosis not present

## 2021-04-15 DIAGNOSIS — J45909 Unspecified asthma, uncomplicated: Secondary | ICD-10-CM | POA: Diagnosis not present

## 2021-04-15 DIAGNOSIS — M86672 Other chronic osteomyelitis, left ankle and foot: Secondary | ICD-10-CM | POA: Diagnosis present

## 2021-04-15 DIAGNOSIS — L97421 Non-pressure chronic ulcer of left heel and midfoot limited to breakdown of skin: Secondary | ICD-10-CM | POA: Diagnosis not present

## 2021-04-15 DIAGNOSIS — E43 Unspecified severe protein-calorie malnutrition: Secondary | ICD-10-CM | POA: Diagnosis not present

## 2021-04-15 DIAGNOSIS — E1122 Type 2 diabetes mellitus with diabetic chronic kidney disease: Secondary | ICD-10-CM | POA: Diagnosis not present

## 2021-04-15 DIAGNOSIS — L97429 Non-pressure chronic ulcer of left heel and midfoot with unspecified severity: Secondary | ICD-10-CM | POA: Diagnosis present

## 2021-04-15 DIAGNOSIS — E162 Hypoglycemia, unspecified: Secondary | ICD-10-CM | POA: Diagnosis not present

## 2021-04-15 DIAGNOSIS — R32 Unspecified urinary incontinence: Secondary | ICD-10-CM | POA: Diagnosis present

## 2021-04-15 DIAGNOSIS — B370 Candidal stomatitis: Secondary | ICD-10-CM | POA: Diagnosis present

## 2021-04-15 DIAGNOSIS — E11621 Type 2 diabetes mellitus with foot ulcer: Secondary | ICD-10-CM | POA: Diagnosis present

## 2021-04-15 DIAGNOSIS — I70202 Unspecified atherosclerosis of native arteries of extremities, left leg: Secondary | ICD-10-CM | POA: Diagnosis not present

## 2021-04-15 DIAGNOSIS — D649 Anemia, unspecified: Secondary | ICD-10-CM | POA: Diagnosis not present

## 2021-04-15 DIAGNOSIS — R531 Weakness: Secondary | ICD-10-CM | POA: Diagnosis not present

## 2021-04-15 DIAGNOSIS — M869 Osteomyelitis, unspecified: Secondary | ICD-10-CM

## 2021-04-15 DIAGNOSIS — D500 Iron deficiency anemia secondary to blood loss (chronic): Secondary | ICD-10-CM | POA: Diagnosis not present

## 2021-04-15 DIAGNOSIS — K219 Gastro-esophageal reflux disease without esophagitis: Secondary | ICD-10-CM | POA: Diagnosis present

## 2021-04-15 DIAGNOSIS — I10 Essential (primary) hypertension: Secondary | ICD-10-CM | POA: Diagnosis not present

## 2021-04-15 DIAGNOSIS — S98131A Complete traumatic amputation of one right lesser toe, initial encounter: Secondary | ICD-10-CM | POA: Diagnosis not present

## 2021-04-15 DIAGNOSIS — R109 Unspecified abdominal pain: Secondary | ICD-10-CM | POA: Diagnosis not present

## 2021-04-15 DIAGNOSIS — M86172 Other acute osteomyelitis, left ankle and foot: Secondary | ICD-10-CM

## 2021-04-15 DIAGNOSIS — E8809 Other disorders of plasma-protein metabolism, not elsewhere classified: Secondary | ICD-10-CM | POA: Diagnosis not present

## 2021-04-15 DIAGNOSIS — R110 Nausea: Secondary | ICD-10-CM | POA: Diagnosis not present

## 2021-04-15 DIAGNOSIS — I471 Supraventricular tachycardia: Secondary | ICD-10-CM | POA: Diagnosis present

## 2021-04-15 DIAGNOSIS — E11610 Type 2 diabetes mellitus with diabetic neuropathic arthropathy: Secondary | ICD-10-CM | POA: Diagnosis not present

## 2021-04-15 DIAGNOSIS — L97509 Non-pressure chronic ulcer of other part of unspecified foot with unspecified severity: Secondary | ICD-10-CM | POA: Diagnosis not present

## 2021-04-15 DIAGNOSIS — I428 Other cardiomyopathies: Secondary | ICD-10-CM | POA: Diagnosis present

## 2021-04-15 DIAGNOSIS — E11649 Type 2 diabetes mellitus with hypoglycemia without coma: Secondary | ICD-10-CM | POA: Diagnosis not present

## 2021-04-15 DIAGNOSIS — L97529 Non-pressure chronic ulcer of other part of left foot with unspecified severity: Secondary | ICD-10-CM | POA: Diagnosis present

## 2021-04-15 DIAGNOSIS — D72819 Decreased white blood cell count, unspecified: Secondary | ICD-10-CM | POA: Diagnosis not present

## 2021-04-15 DIAGNOSIS — L8990 Pressure ulcer of unspecified site, unspecified stage: Secondary | ICD-10-CM | POA: Insufficient documentation

## 2021-04-15 DIAGNOSIS — I130 Hypertensive heart and chronic kidney disease with heart failure and stage 1 through stage 4 chronic kidney disease, or unspecified chronic kidney disease: Secondary | ICD-10-CM | POA: Diagnosis not present

## 2021-04-15 DIAGNOSIS — R0602 Shortness of breath: Secondary | ICD-10-CM | POA: Diagnosis not present

## 2021-04-15 DIAGNOSIS — N1832 Chronic kidney disease, stage 3b: Secondary | ICD-10-CM | POA: Diagnosis not present

## 2021-04-15 DIAGNOSIS — N400 Enlarged prostate without lower urinary tract symptoms: Secondary | ICD-10-CM | POA: Diagnosis present

## 2021-04-15 DIAGNOSIS — M86072 Acute hematogenous osteomyelitis, left ankle and foot: Secondary | ICD-10-CM

## 2021-04-15 DIAGNOSIS — L02416 Cutaneous abscess of left lower limb: Secondary | ICD-10-CM | POA: Diagnosis not present

## 2021-04-15 DIAGNOSIS — K529 Noninfective gastroenteritis and colitis, unspecified: Secondary | ICD-10-CM | POA: Diagnosis present

## 2021-04-15 DIAGNOSIS — K6289 Other specified diseases of anus and rectum: Secondary | ICD-10-CM | POA: Diagnosis not present

## 2021-04-15 DIAGNOSIS — I5043 Acute on chronic combined systolic (congestive) and diastolic (congestive) heart failure: Secondary | ICD-10-CM | POA: Diagnosis not present

## 2021-04-15 DIAGNOSIS — B3789 Other sites of candidiasis: Secondary | ICD-10-CM | POA: Diagnosis present

## 2021-04-15 DIAGNOSIS — M1A9XX0 Chronic gout, unspecified, without tophus (tophi): Secondary | ICD-10-CM | POA: Diagnosis present

## 2021-04-15 DIAGNOSIS — I517 Cardiomegaly: Secondary | ICD-10-CM | POA: Diagnosis not present

## 2021-04-15 DIAGNOSIS — Z79899 Other long term (current) drug therapy: Secondary | ICD-10-CM

## 2021-04-15 DIAGNOSIS — E876 Hypokalemia: Secondary | ICD-10-CM | POA: Diagnosis not present

## 2021-04-15 DIAGNOSIS — Z6825 Body mass index (BMI) 25.0-25.9, adult: Secondary | ICD-10-CM

## 2021-04-15 DIAGNOSIS — D696 Thrombocytopenia, unspecified: Secondary | ICD-10-CM | POA: Diagnosis not present

## 2021-04-15 DIAGNOSIS — R0902 Hypoxemia: Secondary | ICD-10-CM | POA: Diagnosis not present

## 2021-04-15 DIAGNOSIS — N179 Acute kidney failure, unspecified: Secondary | ICD-10-CM | POA: Diagnosis present

## 2021-04-15 DIAGNOSIS — R55 Syncope and collapse: Secondary | ICD-10-CM | POA: Diagnosis not present

## 2021-04-15 DIAGNOSIS — I7300 Raynaud's syndrome without gangrene: Secondary | ICD-10-CM | POA: Diagnosis present

## 2021-04-15 DIAGNOSIS — R609 Edema, unspecified: Secondary | ICD-10-CM | POA: Diagnosis not present

## 2021-04-15 DIAGNOSIS — Z4781 Encounter for orthopedic aftercare following surgical amputation: Secondary | ICD-10-CM | POA: Diagnosis not present

## 2021-04-15 DIAGNOSIS — Z7952 Long term (current) use of systemic steroids: Secondary | ICD-10-CM

## 2021-04-15 DIAGNOSIS — R000 Tachycardia, unspecified: Secondary | ICD-10-CM | POA: Diagnosis not present

## 2021-04-15 DIAGNOSIS — Z7401 Bed confinement status: Secondary | ICD-10-CM | POA: Diagnosis not present

## 2021-04-15 DIAGNOSIS — R338 Other retention of urine: Secondary | ICD-10-CM

## 2021-04-15 DIAGNOSIS — M7989 Other specified soft tissue disorders: Secondary | ICD-10-CM | POA: Diagnosis not present

## 2021-04-15 DIAGNOSIS — Z7984 Long term (current) use of oral hypoglycemic drugs: Secondary | ICD-10-CM

## 2021-04-15 DIAGNOSIS — L97929 Non-pressure chronic ulcer of unspecified part of left lower leg with unspecified severity: Secondary | ICD-10-CM | POA: Diagnosis not present

## 2021-04-15 LAB — CBC WITH DIFFERENTIAL/PLATELET
Abs Immature Granulocytes: 0.05 10*3/uL (ref 0.00–0.07)
Basophils Absolute: 0 10*3/uL (ref 0.0–0.1)
Basophils Relative: 0 %
Eosinophils Absolute: 0 10*3/uL (ref 0.0–0.5)
Eosinophils Relative: 0 %
HCT: 44.9 % (ref 39.0–52.0)
Hemoglobin: 14.3 g/dL (ref 13.0–17.0)
Immature Granulocytes: 1 %
Lymphocytes Relative: 5 %
Lymphs Abs: 0.5 10*3/uL — ABNORMAL LOW (ref 0.7–4.0)
MCH: 27 pg (ref 26.0–34.0)
MCHC: 31.8 g/dL (ref 30.0–36.0)
MCV: 84.7 fL (ref 80.0–100.0)
Monocytes Absolute: 0.6 10*3/uL (ref 0.1–1.0)
Monocytes Relative: 7 %
Neutro Abs: 7.7 10*3/uL (ref 1.7–7.7)
Neutrophils Relative %: 87 %
Platelets: 422 10*3/uL — ABNORMAL HIGH (ref 150–400)
RBC: 5.3 MIL/uL (ref 4.22–5.81)
RDW: 17.2 % — ABNORMAL HIGH (ref 11.5–15.5)
WBC: 8.9 10*3/uL (ref 4.0–10.5)
nRBC: 0 % (ref 0.0–0.2)

## 2021-04-15 LAB — BASIC METABOLIC PANEL
Anion gap: 10 (ref 5–15)
BUN: 49 mg/dL — ABNORMAL HIGH (ref 8–23)
CO2: 26 mmol/L (ref 22–32)
Calcium: 8.7 mg/dL — ABNORMAL LOW (ref 8.9–10.3)
Chloride: 98 mmol/L (ref 98–111)
Creatinine, Ser: 2.18 mg/dL — ABNORMAL HIGH (ref 0.61–1.24)
GFR, Estimated: 33 mL/min — ABNORMAL LOW (ref >60)
Glucose, Bld: 93 mg/dL (ref 70–99)
Potassium: 4.8 mmol/L (ref 3.5–5.1)
Sodium: 134 mmol/L — ABNORMAL LOW (ref 135–145)

## 2021-04-15 LAB — BRAIN NATRIURETIC PEPTIDE: B Natriuretic Peptide: >4500 pg/mL — ABNORMAL HIGH (ref 0.0–100.0)

## 2021-04-15 NOTE — ED Triage Notes (Signed)
Patient BIB [facility]. [facility] called EMS for pt, was on toilet and unable to get up under own power. Pt has infection to left foot, due for amputation on Wednesday.

## 2021-04-15 NOTE — ED Provider Notes (Addendum)
Emergency Medicine Provider Triage Evaluation Note  Stephen Whitaker , a 65 y.o. male  was evaluated in triage.  Pt complains of multiple complaints.  He is followed by Dr. Sharol Given after having transmetatarsal amputation left lower extremity.  He has been on doxycycline however is having worsening odor and wound.  He is supposed to have transtibial amputation on Wednesday. Apparently having decreased ability to ambulate at home. Had to call landlord to get him off the commode earlier today. Denies recent falls or injuries. Has missed some doses of Doxy and diuretic due to work recently.  Patient also with increased lower extremity swelling.  States he has been taking diuretic without relief.  No chest pain, shortness of breath, no fevers, chills or emesis. Neuropathy to lower extremities.  Review of Systems  Positive: Worsening left lower extremity wound, lower extremity edema Negative: Fever, chills, emesis, chest pain  Physical Exam  There were no vitals taken for this visit. Gen:   Awake, no distress   Resp:  Normal effort, Speaks in full sentences MSK:   Moves extremities without difficulty, transmetatarsal amputation left lower extremity.  Patient malodorous, draining wound to plantar aspect left foot.  Pitting edema to lower extremities Other:    Medical Decision Making  Medically screening exam initiated at 4:53 PM.  Appropriate orders placed.  [name] was informed that the remainder of the evaluation will be completed by another provider, this initial triage assessment does not replace that evaluation, and the importance of remaining in the ED until their evaluation is complete.  Worsening LLE wound, decreased mobility       [name], PA-C 04/15/21 1654    [name], MD 04/15/21 1940

## 2021-04-16 ENCOUNTER — Encounter (HOSPITAL_COMMUNITY): Payer: BC Managed Care – PPO

## 2021-04-16 ENCOUNTER — Emergency Department (HOSPITAL_COMMUNITY): Payer: BC Managed Care – PPO

## 2021-04-16 ENCOUNTER — Encounter (HOSPITAL_COMMUNITY): Payer: Self-pay | Admitting: Internal Medicine

## 2021-04-16 DIAGNOSIS — M86172 Other acute osteomyelitis, left ankle and foot: Secondary | ICD-10-CM

## 2021-04-16 DIAGNOSIS — M86672 Other chronic osteomyelitis, left ankle and foot: Secondary | ICD-10-CM | POA: Diagnosis present

## 2021-04-16 DIAGNOSIS — I5043 Acute on chronic combined systolic (congestive) and diastolic (congestive) heart failure: Secondary | ICD-10-CM | POA: Diagnosis not present

## 2021-04-16 DIAGNOSIS — E43 Unspecified severe protein-calorie malnutrition: Secondary | ICD-10-CM | POA: Diagnosis not present

## 2021-04-16 DIAGNOSIS — I82611 Acute embolism and thrombosis of superficial veins of right upper extremity: Secondary | ICD-10-CM | POA: Diagnosis present

## 2021-04-16 DIAGNOSIS — B370 Candidal stomatitis: Secondary | ICD-10-CM | POA: Diagnosis present

## 2021-04-16 DIAGNOSIS — I428 Other cardiomyopathies: Secondary | ICD-10-CM | POA: Diagnosis present

## 2021-04-16 DIAGNOSIS — R609 Edema, unspecified: Secondary | ICD-10-CM | POA: Diagnosis not present

## 2021-04-16 DIAGNOSIS — I130 Hypertensive heart and chronic kidney disease with heart failure and stage 1 through stage 4 chronic kidney disease, or unspecified chronic kidney disease: Secondary | ICD-10-CM | POA: Diagnosis not present

## 2021-04-16 DIAGNOSIS — E1169 Type 2 diabetes mellitus with other specified complication: Secondary | ICD-10-CM | POA: Diagnosis present

## 2021-04-16 DIAGNOSIS — J449 Chronic obstructive pulmonary disease, unspecified: Secondary | ICD-10-CM | POA: Diagnosis present

## 2021-04-16 DIAGNOSIS — L089 Local infection of the skin and subcutaneous tissue, unspecified: Secondary | ICD-10-CM

## 2021-04-16 DIAGNOSIS — E11628 Type 2 diabetes mellitus with other skin complications: Secondary | ICD-10-CM | POA: Diagnosis present

## 2021-04-16 DIAGNOSIS — Z20822 Contact with and (suspected) exposure to covid-19: Secondary | ICD-10-CM | POA: Diagnosis not present

## 2021-04-16 DIAGNOSIS — L89322 Pressure ulcer of left buttock, stage 2: Secondary | ICD-10-CM | POA: Diagnosis not present

## 2021-04-16 DIAGNOSIS — M86272 Subacute osteomyelitis, left ankle and foot: Secondary | ICD-10-CM | POA: Diagnosis not present

## 2021-04-16 DIAGNOSIS — E11621 Type 2 diabetes mellitus with foot ulcer: Secondary | ICD-10-CM | POA: Diagnosis not present

## 2021-04-16 DIAGNOSIS — L97429 Non-pressure chronic ulcer of left heel and midfoot with unspecified severity: Secondary | ICD-10-CM | POA: Diagnosis present

## 2021-04-16 DIAGNOSIS — M869 Osteomyelitis, unspecified: Secondary | ICD-10-CM | POA: Diagnosis present

## 2021-04-16 DIAGNOSIS — N1832 Chronic kidney disease, stage 3b: Secondary | ICD-10-CM | POA: Diagnosis present

## 2021-04-16 DIAGNOSIS — E11649 Type 2 diabetes mellitus with hypoglycemia without coma: Secondary | ICD-10-CM | POA: Diagnosis not present

## 2021-04-16 DIAGNOSIS — I10 Essential (primary) hypertension: Secondary | ICD-10-CM

## 2021-04-16 DIAGNOSIS — E872 Acidosis: Secondary | ICD-10-CM | POA: Diagnosis not present

## 2021-04-16 DIAGNOSIS — N179 Acute kidney failure, unspecified: Secondary | ICD-10-CM | POA: Diagnosis not present

## 2021-04-16 DIAGNOSIS — E11610 Type 2 diabetes mellitus with diabetic neuropathic arthropathy: Secondary | ICD-10-CM | POA: Diagnosis present

## 2021-04-16 DIAGNOSIS — E1122 Type 2 diabetes mellitus with diabetic chronic kidney disease: Secondary | ICD-10-CM | POA: Diagnosis not present

## 2021-04-16 DIAGNOSIS — B3789 Other sites of candidiasis: Secondary | ICD-10-CM | POA: Diagnosis not present

## 2021-04-16 DIAGNOSIS — I471 Supraventricular tachycardia: Secondary | ICD-10-CM | POA: Diagnosis present

## 2021-04-16 DIAGNOSIS — I5020 Unspecified systolic (congestive) heart failure: Secondary | ICD-10-CM | POA: Diagnosis not present

## 2021-04-16 DIAGNOSIS — E739 Lactose intolerance, unspecified: Secondary | ICD-10-CM | POA: Diagnosis present

## 2021-04-16 LAB — CREATININE, SERUM
Creatinine, Ser: 1.94 mg/dL — ABNORMAL HIGH (ref 0.61–1.24)
GFR, Estimated: 37 mL/min — ABNORMAL LOW (ref >60)

## 2021-04-16 LAB — LACTIC ACID, PLASMA
Lactic Acid, Venous: 2 mmol/L (ref 0.5–1.9)
Lactic Acid, Venous: 1.4 mmol/L (ref 0.5–1.9)

## 2021-04-16 LAB — RESP PANEL BY RT-PCR (FLU A&B, COVID) ARPGX2
Influenza A by PCR: NEGATIVE
Influenza B by PCR: NEGATIVE
SARS Coronavirus 2 by RT PCR: NEGATIVE

## 2021-04-16 LAB — HEMOGLOBIN A1C
Hgb A1c MFr Bld: 6.4 % — ABNORMAL HIGH (ref 4.8–5.6)
Mean Plasma Glucose: 136.98 mg/dL

## 2021-04-16 LAB — C-REACTIVE PROTEIN: CRP: 11.9 mg/dL — ABNORMAL HIGH (ref <1.0)

## 2021-04-16 LAB — MAGNESIUM: Magnesium: 1.8 mg/dL (ref 1.7–2.4)

## 2021-04-16 LAB — PREALBUMIN: Prealbumin: 9.8 mg/dL — ABNORMAL LOW (ref 18–38)

## 2021-04-16 LAB — SEDIMENTATION RATE: Sed Rate: 40 mm/hr — ABNORMAL HIGH (ref 0–16)

## 2021-04-16 LAB — HIV ANTIBODY (ROUTINE TESTING W REFLEX): HIV Screen 4th Generation wRfx: NONREACTIVE

## 2021-04-16 MED ORDER — DOCUSATE SODIUM 100 MG PO CAPS
100.0000 mg | ORAL_CAPSULE | Freq: Two times a day (BID) | ORAL | Status: DC
  Administered 2021-04-16 – 2021-04-17 (×4): 100 mg via ORAL
  Filled 2021-04-16 (×4): qty 1

## 2021-04-16 MED ORDER — HYDRALAZINE HCL 20 MG/ML IJ SOLN: 5.0000 mg | INTRAMUSCULAR | Status: DC | PRN

## 2021-04-16 MED ORDER — ACETAMINOPHEN 650 MG RE SUPP: 650.0000 mg | Freq: Four times a day (QID) | RECTAL | Status: DC | PRN

## 2021-04-16 MED ORDER — PANTOPRAZOLE SODIUM 40 MG PO TBEC
40.0000 mg | DELAYED_RELEASE_TABLET | Freq: Two times a day (BID) | ORAL | Status: DC
  Administered 2021-04-16 – 2021-04-20 (×9): 40 mg via ORAL
  Filled 2021-04-16 (×10): qty 1

## 2021-04-16 MED ORDER — POLYETHYLENE GLYCOL 3350 17 G PO PACK: 17.0000 g | PACK | Freq: Every day | ORAL | Status: DC | PRN

## 2021-04-16 MED ORDER — MONTELUKAST SODIUM 10 MG PO TABS
10.0000 mg | ORAL_TABLET | Freq: Every day | ORAL | Status: DC
  Administered 2021-04-16 – 2021-05-01 (×16): 10 mg via ORAL
  Filled 2021-04-16 (×17): qty 1

## 2021-04-16 MED ORDER — HYDROCODONE-ACETAMINOPHEN 5-325 MG PO TABS
1.0000 | ORAL_TABLET | ORAL | Status: DC | PRN
  Administered 2021-04-17 (×2): 1 via ORAL
  Administered 2021-04-18 – 2021-05-02 (×40): 2 via ORAL
  Filled 2021-04-16 (×9): qty 2
  Filled 2021-04-16: qty 1
  Filled 2021-04-16 (×28): qty 2
  Filled 2021-04-16: qty 1
  Filled 2021-04-16 (×5): qty 2

## 2021-04-16 MED ORDER — SODIUM CHLORIDE 0.9 % IV SOLN: INTRAVENOUS | Status: DC

## 2021-04-16 MED ORDER — ACETAMINOPHEN 325 MG PO TABS
650.0000 mg | ORAL_TABLET | Freq: Four times a day (QID) | ORAL | Status: DC | PRN
  Filled 2021-04-16: qty 2

## 2021-04-16 MED ORDER — PIPERACILLIN-TAZOBACTAM 3.375 G IVPB 30 MIN
3.3750 g | Freq: Once | INTRAVENOUS | Status: AC
  Administered 2021-04-16: 3.375 g via INTRAVENOUS
  Filled 2021-04-16: qty 50

## 2021-04-16 MED ORDER — OLOPATADINE HCL 0.1 % OP SOLN
1.0000 [drp] | Freq: Two times a day (BID) | OPHTHALMIC | Status: DC
  Administered 2021-04-16 – 2021-05-02 (×29): 1 [drp] via OPHTHALMIC
  Filled 2021-04-16 (×4): qty 5

## 2021-04-16 MED ORDER — LORATADINE 10 MG PO TABS
10.0000 mg | ORAL_TABLET | Freq: Every day | ORAL | Status: DC
  Administered 2021-04-16 – 2021-05-02 (×16): 10 mg via ORAL
  Filled 2021-04-16 (×17): qty 1

## 2021-04-16 MED ORDER — METOPROLOL SUCCINATE ER 25 MG PO TB24
25.0000 mg | ORAL_TABLET | Freq: Every day | ORAL | Status: DC
  Administered 2021-04-16 – 2021-05-02 (×17): 25 mg via ORAL
  Filled 2021-04-16 (×18): qty 1

## 2021-04-16 MED ORDER — BISACODYL 5 MG PO TBEC
5.0000 mg | DELAYED_RELEASE_TABLET | Freq: Every day | ORAL | Status: DC | PRN
  Filled 2021-04-16: qty 1

## 2021-04-16 MED ORDER — VANCOMYCIN HCL IN DEXTROSE 1-5 GM/200ML-% IV SOLN
1000.0000 mg | INTRAVENOUS | Status: DC
  Administered 2021-04-17: 1000 mg via INTRAVENOUS
  Filled 2021-04-16: qty 200

## 2021-04-16 MED ORDER — ONDANSETRON HCL 4 MG/2ML IJ SOLN
4.0000 mg | Freq: Four times a day (QID) | INTRAMUSCULAR | Status: DC | PRN
  Administered 2021-04-22 – 2021-05-02 (×3): 4 mg via INTRAVENOUS
  Filled 2021-04-16 (×3): qty 2

## 2021-04-16 MED ORDER — FLUTICASONE PROPIONATE 50 MCG/ACT NA SUSP: 1.0000 | Freq: Two times a day (BID) | NASAL | Status: DC

## 2021-04-16 MED ORDER — SODIUM CHLORIDE 0.9 % IV SOLN
2.0000 g | INTRAVENOUS | Status: AC
  Administered 2021-04-16 – 2021-04-22 (×7): 2 g via INTRAVENOUS
  Filled 2021-04-16 (×7): qty 20

## 2021-04-16 MED ORDER — ONDANSETRON HCL 4 MG PO TABS: 4.0000 mg | ORAL_TABLET | Freq: Four times a day (QID) | ORAL | Status: DC | PRN

## 2021-04-16 MED ORDER — MOMETASONE FURO-FORMOTEROL FUM 200-5 MCG/ACT IN AERO: 2.0000 | INHALATION_SPRAY | Freq: Two times a day (BID) | RESPIRATORY_TRACT | Status: DC

## 2021-04-16 MED ORDER — METRONIDAZOLE 500 MG/100ML IV SOLN
500.0000 mg | Freq: Three times a day (TID) | INTRAVENOUS | Status: DC
  Administered 2021-04-16 – 2021-04-22 (×17): 500 mg via INTRAVENOUS
  Filled 2021-04-16 (×20): qty 100

## 2021-04-16 MED ORDER — MORPHINE SULFATE (PF) 2 MG/ML IV SOLN
2.0000 mg | INTRAVENOUS | Status: DC | PRN
  Administered 2021-04-16 – 2021-04-24 (×8): 2 mg via INTRAVENOUS
  Filled 2021-04-16 (×9): qty 1

## 2021-04-16 MED ORDER — VANCOMYCIN HCL 1500 MG/300ML IV SOLN
1500.0000 mg | Freq: Once | INTRAVENOUS | Status: AC
  Administered 2021-04-16: 1500 mg via INTRAVENOUS
  Filled 2021-04-16: qty 300

## 2021-04-16 MED ORDER — OLOPATADINE HCL 0.1 % OP SOLN: 1.0000 [drp] | Freq: Two times a day (BID) | OPHTHALMIC | Status: DC

## 2021-04-16 MED ORDER — NYSTATIN 100000 UNIT/ML MT SUSP
5.0000 mL | Freq: Four times a day (QID) | OROMUCOSAL | Status: DC
  Administered 2021-04-16 – 2021-05-02 (×54): 500000 [IU] via ORAL
  Filled 2021-04-16 (×58): qty 5

## 2021-04-16 MED ORDER — ALBUTEROL SULFATE (2.5 MG/3ML) 0.083% IN NEBU: 2.5000 mg | INHALATION_SOLUTION | RESPIRATORY_TRACT | Status: DC | PRN

## 2021-04-16 MED ORDER — FLUTICASONE PROPIONATE 50 MCG/ACT NA SUSP
1.0000 | Freq: Two times a day (BID) | NASAL | Status: DC
  Administered 2021-04-16 – 2021-05-02 (×28): 1 via NASAL
  Filled 2021-04-16 (×5): qty 16

## 2021-04-16 MED ORDER — ALLOPURINOL 300 MG PO TABS
300.0000 mg | ORAL_TABLET | Freq: Every day | ORAL | Status: DC
  Administered 2021-04-16 – 2021-05-02 (×16): 300 mg via ORAL
  Filled 2021-04-16 (×3): qty 1
  Filled 2021-04-16: qty 3
  Filled 2021-04-16 (×13): qty 1

## 2021-04-16 MED ORDER — MOMETASONE FURO-FORMOTEROL FUM 200-5 MCG/ACT IN AERO
2.0000 | INHALATION_SPRAY | Freq: Two times a day (BID) | RESPIRATORY_TRACT | Status: DC
  Administered 2021-04-16 – 2021-05-02 (×30): 2 via RESPIRATORY_TRACT
  Filled 2021-04-16 (×4): qty 8.8

## 2021-04-16 NOTE — ED Notes (Signed)
LACTIC 2.0 

## 2021-04-16 NOTE — Consult Note (Signed)
[facility] Nurse Consult Note: Reason for Consult: Left medial foot ulceration. Scheduled for transtibial amputation with Dr. Sharol Given either Tuesday or Wednesday this week. Conservative orders are placed for the draining wound, for pressure injury prevention for the sacrum and right heel. Wound type: Infectious, chronic full thickness Pressure Injury POA: N/A Measurement: See Dr. Jess Barters note from earlier today Wound bed: red, draining Drainage (amount, consistency, odor) light yellow, moderate Periwound: warm, red, edematous Dressing procedure/placement/frequency: As noted above, a conservative care place is placed for topical care of this wound using a NS cleanse and saline wet-to-dry until DOS. The right heel is to be elevated using a pressure redistribution heel boot and a sacral prophylactic foam placed for PI prevention.    [facility] nursing team will not follow, but will remain available to this patient, the nursing and medical teams.  Please re-consult if needed. Thanks, Maudie Flakes, MSN, RN, [place], Arther Abbott  Pager# [phone]

## 2021-04-16 NOTE — H&P (Signed)
History and Physical    Stephen Whitaker OAC:[id number] DOB: Jan 17, 1956 DOA: 04/15/2021  PCP: [name], NP Consultants:  Sharol Given - orthopedics; [place] - cardiology Patient coming from:  Home - lives with landlord (but received eviction notice); NOK: [place], [place], [phone]  Chief Complaint: foot infection  HPI: Stephen Whitaker is a 65 y.o. male with medical history significant of COPD; chronic systolic CHF (EF 32-20%); DM; and HTN presenting with foot infection.  He has been followed by Dr. Sharol Given for his chronic L Charcot foot ulceration and has been on doxy with wound care.  He was last seen on 8/25 and was planning for BKA this coming Wednesday (8/31).   The patient worked yesterday and drove home but was too tired to get out of the car and fell asleep in the driveway.  Neighbors helped him inside and then he was too weak to get off the commode and so he came to the ER.  He denied any additional physical concerns at the time of our discussion.  He did report unstable living situation (landlord is evicting him), need for assistance in obtaining short and long-term disability through his work, and probable need for placement after surgery since he does not have other available housing options.    ED Course: [place], per Dr. Cyd Silence:  Ulceration and osteomyelitis of the left foot.  Patient has been following with Dr. Sharol Given as an outpatient and is actually supposed to undergo amputation on Wednesday.  Unfortunately, patient has rapidly become more more weak over the past several days, and has been found in his bathroom by his landlord unable to stand up because he is so weak.  Patient brought in for profound weakness.  On evaluation in the emergency department both ulceration of the foot has gotten worse in addition to evidence on x-ray of progressively worsening osteomyelitis.  ER provider thinks that patient should be hospitalized for evaluation by Dr. Sharol Given for an  accelerated timeline on amputation.  Intravenous Zosyn and vancomycin given.  Review of Systems: As per HPI; otherwise review of systems reviewed and negative.   Ambulatory Status:  Ambulates without assistance  COVID Vaccine Status:  Complete  Past Medical History:  Diagnosis Date   Abnormal liver function tests    Arthritis    Boil of buttock    COPD (chronic obstructive pulmonary disease) (HCC)    Chronic Bronchitis.Advair inhaler    Dyspnea    GERD (gastroesophageal reflux disease)    takes Protonix daily   Gout    takes Allopurinol daily   Hemorrhoids    Hypertension    takes HCTZ daily   Hypomagnesemia    Leg ulcer (HCC)    Mild mitral regurgitation    Pancreatitis    10 years ago   Pneumonia 2016   Tricuspid regurgitation    Tubular adenoma of colon     Past Surgical History:  Procedure Laterality Date   AMPUTATION Left 07/14/2014   Procedure: 2nd Toe Amputation;  Surgeon: [name], MD;  Location: [facility];  Service: Orthopedics;  Laterality: Left;   AMPUTATION Left 11/16/2015   Procedure: Left Great Toe Amputation at Metatarsophalangeal Joint;  Surgeon: [name], MD;  Location: [facility];  Service: Orthopedics;  Laterality: Left;   AMPUTATION Right 06/13/2016   Procedure: RIGHT 2nd Toe Amputation;  Surgeon: [name], MD;  Location: [facility];  Service: Orthopedics;  Laterality: Right;   AMPUTATION Left 10/19/2016   Procedure: Amputation Left 3rd  Toe;  Surgeon: [name], MD;  Location: [facility];  Service: Orthopedics;  Laterality: Left;   RIGHT/LEFT HEART CATH AND CORONARY ANGIOGRAPHY N/A 12/12/2020   Procedure: RIGHT/LEFT HEART CATH AND CORONARY ANGIOGRAPHY;  Surgeon: Lorretta Harp, MD;  Location: [facility];  Service: Cardiovascular;  Laterality: N/A;   TOE AMPUTATION Left 11/16/2015   left great toe    TOE AMPUTATION Right 06/13/2016   TONSILLECTOMY      Social History   Socioeconomic History   Marital status: Single    Spouse name: Not  on file   Number of children: 1   Years of education:     Highest education level: Not on file  Occupational History   Not on file  Tobacco Use   Smoking status: Never   Smokeless tobacco: Never  Vaping Use   Vaping Use: Never used  Substance and Sexual Activity   Alcohol use: Yes    Alcohol/week: 0.0 standard drinks    Comment: rarely   Drug use: No   Sexual activity: Not Currently  Other Topics Concern   Not on file  Social History Narrative   Not on file   Social Determinants of Health   Financial Resource Strain: Not on file  Food Insecurity: Not on file  Transportation Needs: Not on file  Physical Activity: Not on file  Stress: Not on file  Social Connections: Not on file  Intimate Partner Violence: Not on file    Allergies  Allergen Reactions   Ciprofloxacin Rash and Other (See Comments)    SYNCOPE    Shellfish Allergy Anaphylaxis   Sulfa Antibiotics Other (See Comments)    SYNCOPE DIZZINESS     Family History  Adopted: Yes  Problem Relation Age of Onset   Cancer Sister 61       unknown origin     Prior to Admission medications   Medication Sig Start Date End Date Taking? Authorizing Provider  albuterol (VENTOLIN HFA) 108 (90 Base) MCG/ACT inhaler Inhale 2 puffs into the lungs every 4 (four) hours as needed for wheezing or shortness of breath. 10/06/20   Tanda Rockers, MD  allopurinol (ZYLOPRIM) 300 MG tablet Take 1 tablet (300 mg total) by mouth daily. 02/15/21   Persons, Bevely Palmer, PA  azelastine (ASTELIN) 0.1 % nasal spray Place 1-2 sprays into both nostrils 2 (two) times daily as needed (nasal drainage). Use in each nostril as directed 01/30/21   Garnet Sierras, DO  azithromycin [facility]) 250 MG tablet Take 2 tabs day one, then 1 tab daily until complete 02/28/21   Volney American, PA-C  Bioflavonoid Products (ESTER-C) TABS Take 1 tablet by mouth daily.    [provider]  Blood Pressure Monitoring DEVI 1 Units by Does not apply route  daily. Measure blood pressure once a day 07/08/19   Melynda Ripple, MD  budesonide-formoterol [facility]) 160-4.5 MCG/ACT inhaler USE 2 INHALATIONS TWICE A DAY 03/13/21   Tanda Rockers, MD  Digestive Enzymes (PAPAYA AND ENZYMES) CHEW Chew 3 tablets by mouth as needed (as needed for spicy food, indigestion, and heart burn).    [provider]  doxycycline (VIBRA-TABS) 100 MG tablet Take 1 tablet (100 mg total) by mouth 2 (two) times daily. 04/06/21   [name], MD  empagliflozin (JARDIANCE) 10 MG TABS tablet Take 1 tablet (10 mg total) by mouth daily before breakfast. 02/22/21   Chandrasekhar, Mahesh A, MD  EPINEPHrine (AUVI-Q) 0.3 mg/0.3 mL IJ SOAJ injection  Inject 0.3 mg into the muscle as needed for anaphylaxis. 01/30/21   Garnet Sierras, DO  fluticasone (FLONASE) 50 MCG/ACT nasal spray Place 1 spray into both nostrils 2 (two) times daily. 04/08/20   Tanda Rockers, MD  HYDROcodone-acetaminophen (NORCO/VICODIN) 5-325 MG tablet Take 1 tablet by mouth every 6 (six) hours as needed for moderate pain. 03/14/21   Suzan Slick, NP  HYDROcodone-acetaminophen (NORCO/VICODIN) 5-325 MG tablet Take 1-2 tablets by mouth every 6 (six) hours as needed for moderate pain. 04/06/21   [name], MD  Lactase (LACTAID FAST ACT PO) Take 2 tablets by mouth as needed (with dairy food).    [provider]  loratadine (CLARITIN) 10 MG tablet Take 10 mg by mouth daily.    [provider]  Magnesium Oxide 400 MG CAPS Take 2 capsules (800 mg total) by mouth daily. 02/28/21   [name], MD  meloxicam (MOBIC) 7.5 MG tablet Take 1 tablet by mouth once daily 01/25/21   Persons, Bevely Palmer, PA  metoprolol succinate (TOPROL XL) 25 MG 24 hr tablet Take 1 tablet (25 mg total) by mouth daily. 01/04/21   Chandrasekhar, Mahesh A, MD  MITIGARE 0.6 MG CAPS Take 1 capsule by mouth as needed. 02/02/21   [provider]  montelukast (SINGULAIR) 10 MG tablet TAKE 1 TABLET AT BEDTIME 06/27/20    Tanda Rockers, MD  Olopatadine HCl 0.2 % SOLN Apply 1 drop to eye daily as needed (itchy/watery eyes). 01/30/21   Garnet Sierras, DO  pantoprazole (PROTONIX) 40 MG tablet TAKE ONE TABLET BY MOUTH 30 TO 60 MINUTES BEFORE YOUR FIRST AND LAST MEALS OF THE DAY 01/25/21   Tanda Rockers, MD  predniSONE (DELTASONE) 20 MG tablet Take 2 tablets (40 mg total) by mouth daily with breakfast. 02/28/21   Volney American, PA-C  Probiotic Product (SUPER PROBIOTIC DIGESTIVE) CAPS Take 1 tablet by mouth as needed (stomach upset). Take as needed with meals    [provider]  sacubitril-valsartan (ENTRESTO) 24-26 MG Take 1 tablet by mouth 2 (two) times daily. 01/24/21   Chandrasekhar, Mahesh A, MD  SPIRULINA PO Take 1 capsule by mouth 3 (three) times daily.    [provider]  torsemide (DEMADEX) 20 MG tablet Take 2 tablets (40 mg total) by mouth daily. 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   Respiratory:   CTA bilaterally with no wheezes/rales/rhonchi.  Normal respiratory effort. Abdomen:  soft, NT, ND Skin:  ulceration that is weeping with purulent drainage along the left medial Charcot foot; B stasis changes      Musculoskeletal:  Charcot feet bilaterally with h/o prior amputations of  toes  Psychiatric:  blunted mood and affect, speech conversant and vaguely eccentric Neurologic:  CN 2-12 grossly intact, moves all extremities in coordinated fashion    Radiological Exams on Admission: Independently reviewed - see discussion in A/P where applicable  DG Chest 2 View  Result Date: 04/15/2021 CLINICAL DATA:  Shortness of breath and edema. EXAM: CHEST - 2 VIEW COMPARISON:  October 07, 2020 FINDINGS: The heart size and mediastinal contours are within normal limits. Both lungs are clear. The visualized skeletal structures are unremarkable. IMPRESSION: No active cardiopulmonary disease. Electronically Signed   By: Virgina Norfolk M.D.   On: 04/15/2021 17:41   DG Foot 2 Views Left  Result Date: 04/16/2021 CLINICAL DATA:  Recent amputation.  Unable to get off of toilet. EXAM: LEFT FOOT - 2 VIEW COMPARISON:  04/06/2021 FINDINGS: Marked diffuse soft tissue swelling. Status post first and third amputation at the level of the MCP joints. Status post amputation of the second ray at the level of the head the metacarpal bone. Chronic neuropathic changes involving the hindfoot with collapse of the talar dome suspected There is a gas containing wound along the medial aspect of the midfoot overlying the navicular bone with signs of cortical destruction and erosion of the medial cortex of the navicular bone which appear progressive compared with 04/06/2021. IMPRESSION: 1. Findings compatible with progressive osteomyelitis of the medial aspect of the navicular bone with overlying soft tissue ulceration. 2. Marked diffuse soft tissue swelling. 3. Chronic advanced arthropathic changes of the hindfoot, likely neuropathic. Electronically Signed   By: Kerby Moors M.D.   On: 04/16/2021 05:01    EKG: Independently reviewed.  Sinus tachycardia with rate 112; nonspecific ST changes that may be rate related, more pronounced than prior   Labs on Admission: I have personally reviewed the available labs and  imaging studies at the time of the admission.  Pertinent labs:   BUN 49/Creatinine 2.18/GFR 33; baseline appears to be about 1.3 but GFR about 60 BNP >4500 - also was this on 01/31/21 WBC 8.9 Lactate 2.0, 1.4 ESR 40 A1c 6.4 COVID/flu negative   Assessment/Plan Principal Problem:   Osteomyelitis of left foot (HCC) Active Problems:   COLD (chronic obstructive lung disease) (HCC)   Essential hypertension   HFrEF (heart failure with reduced ejection fraction) (HCC)   Fatigue   Osteo -Patient with Charcot foot and prior h/o amputations  -He has had a chronic LLE ulcer and is planned for amputation Wednesday -He presented today due to weakness -There is nothing obvious to suggest underlying unstable medical problems other than the osteo -The patient reported that maybe the antibiotics would prevent a need for amputation, but the situation appears to be too far progressed for that -Patient with h/o prior antibiotic treatment with Doxy and so has failed outpatient management -Foot ulcer is present and draining  -Minimally elevated lactate that has cleared, no current concerns for sepsis -Will treat with IV antibiotics (Rocephin/Flagyl/Vancomycin) -Dr. Sharol Given has consulted and will move the surgery up to 8/30 -Patient has h/o PVD and so I have ordered ABIs in case revascularization may be indicated -LE wound order set utilized including labs (CRP, ESR, A1c, prealbumin, HIV, and blood cultures) and consults (peripheral vascular navigator; TOC team; wound care; and nutrition)   AKI -Appears to be more volume restricted in the setting of ongoing infection, likely decreased PO intake -Will hold diuretics and give gentle IVF and follow for now -Repeat BMP in AM  Thrush -Nystatin  Chronic systolic CHF -01/2946  echo with EF 20-25% and grade 2 diastolic dysfunction -He appears to be compensated at this time but mentioned concern about this issue to Dr. Sharol Given -Will ask cardiology to see the  patient for cardiac clearance (Inbox message sent to [place]) -Hold Entreso and diuretics for now  COPD -Continue Singulair, Dulera  HTN -Continue Toprol XL -Hold Entresto  Gout -Continue Allopurinol  Social issues -Patient has lost his housing and also needs TOC team assistance with disability paperwork -He is likely to need SNF post-amputation     Note: This patient has been tested and is negative for the novel coronavirus COVID-19. The patient has been fully vaccinated against COVID-19.   Level of care: Med-Surg DVT prophylaxis:  SCDs Code Status:  Full - confirmed with patient Family Communication: None present Disposition Plan:  The patient is from: home  Anticipated d/c is to: SNF  Anticipated d/c date will depend on clinical response to treatment, sometime after BKA  Patient is currently: acutely ill Consults called: Orthopedics, cardiology; peripheral vascular navigator; TOC team; wound care; and nutrition  Admission status:  Admit - It is my clinical opinion that admission to INPATIENT is reasonable and necessary because of the expectation that this patient will require hospital care that crosses at least 2 midnights to treat this condition based on the medical complexity of the problems presented.  Given the aforementioned information, the predictability of an adverse outcome is felt to be significant.    Karmen Bongo MD [facility]   How to contact the [facility] Attending or Consulting provider [place] or covering provider during after hours [place], for this patient?  Check the care team in [facility] and look for a) attending/consulting TRH provider listed and b) the [facility] team listed Log into [email or web address] and use [place]'s universal password to access. If you do not have the password, please contact the hospital operator. Locate the [facility] provider you are looking for under [facility] and page to a number that you can be directly reached. If you still  have difficulty reaching the provider, please page the [facility] (Director on Call) for the Hospitalists listed on amion for assistance.   04/16/2021, 6:53 PM

## 2021-04-16 NOTE — H&P (View-Only) (Signed)
ORTHOPAEDIC CONSULTATION  REQUESTING PHYSICIAN: Karmen Bongo, MD  Chief Complaint: Weakness.Marland Kitchen  HPI: Stephen Whitaker is a 65 y.o. male who presents with weakness and chronic osteomyelitis of the left foot with Charcot collapse.  Has been scheduled for a left transtibial amputation this Wednesday.  Patient was found down in his apartment with weakness unable to get up.  Patient denies any acute chest pain.  Patient denies any increased foot pain or symptoms.  Past Medical History:  Diagnosis Date   Abnormal liver function tests    Arthritis    Boil of buttock    COPD (chronic obstructive pulmonary disease) (HCC)    Chronic Bronchitis.Advair inhaler    Dyspnea    GERD (gastroesophageal reflux disease)    takes Protonix daily   Gout    takes Allopurinol daily   Hemorrhoids    Hypertension    takes HCTZ daily   Hypomagnesemia    Leg ulcer (HCC)    Mild mitral regurgitation    Pancreatitis    10 years ago   Pneumonia 2016   Tricuspid regurgitation    Tubular adenoma of colon    Past Surgical History:  Procedure Laterality Date   AMPUTATION Left 07/14/2014   Procedure: 2nd Toe Amputation;  Surgeon: [name], MD;  Location: [place];  Service: Orthopedics;  Laterality: Left;   AMPUTATION Left 11/16/2015   Procedure: Left Great Toe Amputation at Metatarsophalangeal Joint;  Surgeon: [name], MD;  Location: [place];  Service: Orthopedics;  Laterality: Left;   AMPUTATION Right 06/13/2016   Procedure: RIGHT 2nd Toe Amputation;  Surgeon: [name], MD;  Location: [place];  Service: Orthopedics;  Laterality: Right;   AMPUTATION Left 10/19/2016   Procedure: Amputation Left 3rd Toe;  Surgeon: [name], MD;  Location: [place];  Service: Orthopedics;  Laterality: Left;   RIGHT/LEFT HEART CATH AND CORONARY ANGIOGRAPHY N/A 12/12/2020   Procedure: RIGHT/LEFT HEART CATH AND CORONARY ANGIOGRAPHY;  Surgeon: Lorretta Harp, MD;  Location: [facility];  Service:  Cardiovascular;  Laterality: N/A;   TOE AMPUTATION Left 11/16/2015   left great toe    TOE AMPUTATION Right 06/13/2016   TONSILLECTOMY     Social History   Socioeconomic History   Marital status: Single    Spouse name: Not on file   Number of children: 1   Years of education:     Highest education level: Not on file  Occupational History   Not on file  Tobacco Use   Smoking status: Never   Smokeless tobacco: Never  Vaping Use   Vaping Use: Never used  Substance and Sexual Activity   Alcohol use: Yes    Alcohol/week: 0.0 standard drinks    Comment: rarely   Drug use: No   Sexual activity: Not Currently  Other Topics Concern   Not on file  Social History Narrative   Not on file   Social Determinants of Health   Financial Resource Strain: Not on file  Food Insecurity: Not on file  Transportation Needs: Not on file  Physical Activity: Not on file  Stress: Not on file  Social Connections: Not on file   Family History  Adopted: Yes  Problem Relation Age of Onset   Cancer Sister 86       unknown origin    - negative except otherwise stated in the family history section Allergies  Allergen Reactions   Ciprofloxacin Rash and Other (See Comments)  SYNCOPE    Shellfish Allergy Anaphylaxis   Sulfa Antibiotics Other (See Comments)    SYNCOPE DIZZINESS    Prior to Admission medications   Medication Sig Start Date End Date Taking? Authorizing Provider  albuterol (VENTOLIN HFA) 108 (90 Base) MCG/ACT inhaler Inhale 2 puffs into the lungs every 4 (four) hours as needed for wheezing or shortness of breath. 10/06/20  Yes Tanda Rockers, MD  allopurinol (ZYLOPRIM) 300 MG tablet Take 1 tablet (300 mg total) by mouth daily. 02/15/21  Yes Persons, Bevely Palmer, PA  budesonide-formoterol [facility]) 160-4.5 MCG/ACT inhaler USE 2 INHALATIONS TWICE A DAY 03/13/21  Yes Tanda Rockers, MD  Digestive Enzymes (PAPAYA AND ENZYMES) CHEW Chew 3 tablets by mouth as needed (as needed for  spicy food, indigestion, and heart burn).   Yes [provider]  doxycycline (VIBRA-TABS) 100 MG tablet Take 1 tablet (100 mg total) by mouth 2 (two) times daily. 04/06/21  Yes [name], MD  fluticasone (FLONASE) 50 MCG/ACT nasal spray Place 1 spray into both nostrils 2 (two) times daily. 04/08/20  Yes Tanda Rockers, MD  HYDROcodone-acetaminophen (NORCO/VICODIN) 5-325 MG tablet Take 1-2 tablets by mouth every 6 (six) hours as needed for moderate pain. 04/06/21  Yes [name], MD  Lactase (LACTAID FAST ACT PO) Take 2 tablets by mouth as needed (with dairy food).   Yes [provider]  Magnesium Oxide 400 MG CAPS Take 2 capsules (800 mg total) by mouth daily. 02/28/21  Yes Chandrasekhar, Mahesh A, MD  metoprolol succinate (TOPROL XL) 25 MG 24 hr tablet Take 1 tablet (25 mg total) by mouth daily. 01/04/21  Yes Chandrasekhar, Mahesh A, MD  mometasone-formoterol (DULERA) 200-5 MCG/ACT AERO Inhale 2 puffs into the lungs daily as needed for wheezing.   Yes [provider]  montelukast (SINGULAIR) 10 MG tablet TAKE 1 TABLET AT BEDTIME 06/27/20  Yes Tanda Rockers, MD  pantoprazole (PROTONIX) 40 MG tablet TAKE ONE TABLET BY MOUTH 30 TO 60 MINUTES BEFORE YOUR FIRST AND LAST MEALS OF THE DAY Patient taking differently: Take 40 mg by mouth 2 (two) times daily. 01/25/21  Yes Tanda Rockers, MD  predniSONE (DELTASONE) 20 MG tablet Take 2 tablets (40 mg total) by mouth daily with breakfast. 02/28/21  Yes Volney American, PA-C  Probiotic Product (SUPER PROBIOTIC DIGESTIVE) CAPS Take 1 tablet by mouth as needed (stomach upset). Take as needed with meals   Yes [provider]  sacubitril-valsartan (ENTRESTO) 24-26 MG Take 1 tablet by mouth 2 (two) times daily. 01/24/21  Yes Chandrasekhar, Mahesh A, MD  SPIRULINA PO Take 1 capsule by mouth 2 (two) times daily with a meal.   Yes [provider]  torsemide (DEMADEX) 20 MG tablet Take 2 tablets (40 mg total) by mouth  daily. 01/25/21  Yes Chandrasekhar, Mahesh A, MD  Trolamine Salicylate (BLUE-EMU HEMP EX) Apply 1 application topically daily as needed.   Yes [provider]  azelastine (ASTELIN) 0.1 % nasal spray Place 1-2 sprays into both nostrils 2 (two) times daily as needed (nasal drainage). Use in each nostril as directed Patient not taking: No sig reported 01/30/21   Garnet Sierras, DO  Blood Pressure Monitoring DEVI 1 Units by Does not apply route daily. Measure blood pressure once a day 07/08/19   Melynda Ripple, MD  EPINEPHrine (AUVI-Q) 0.3 mg/0.3 mL IJ SOAJ injection Inject 0.3 mg into the muscle as needed for anaphylaxis. Patient not taking: No sig reported 01/30/21  Garnet Sierras, DO  MITIGARE 0.6 MG CAPS Take 1 capsule by mouth as needed. Patient not taking: No sig reported 02/02/21   [provider]  Olopatadine HCl 0.2 % SOLN Apply 1 drop to eye daily as needed (itchy/watery eyes). Patient not taking: No sig reported 01/30/21   Garnet Sierras, DO   DG Chest 2 View  Result Date: 04/15/2021 CLINICAL DATA:  Shortness of breath and edema. EXAM: CHEST - 2 VIEW COMPARISON:  October 07, 2020 FINDINGS: The heart size and mediastinal contours are within normal limits. Both lungs are clear. The visualized skeletal structures are unremarkable. IMPRESSION: No active cardiopulmonary disease. Electronically Signed   By: Virgina Norfolk M.D.   On: 04/15/2021 17:41   DG Foot 2 Views Left  Result Date: 04/16/2021 CLINICAL DATA:  Recent amputation.  Unable to get off of toilet. EXAM: LEFT FOOT - 2 VIEW COMPARISON:  04/06/2021 FINDINGS: Marked diffuse soft tissue swelling. Status post first and third amputation at the level of the MCP joints. Status post amputation of the second ray at the level of the head the metacarpal bone. Chronic neuropathic changes involving the hindfoot with collapse of the talar dome suspected There is a gas containing wound along the medial aspect of the midfoot overlying the  navicular bone with signs of cortical destruction and erosion of the medial cortex of the navicular bone which appear progressive compared with 04/06/2021. IMPRESSION: 1. Findings compatible with progressive osteomyelitis of the medial aspect of the navicular bone with overlying soft tissue ulceration. 2. Marked diffuse soft tissue swelling. 3. Chronic advanced arthropathic changes of the hindfoot, likely neuropathic. Electronically Signed   By: Kerby Moors M.D.   On: 04/16/2021 05:01   - pertinent xrays, CT, MRI studies were reviewed and independently interpreted  Positive ROS: All other systems have been reviewed and were otherwise negative with the exception of those mentioned in the HPI and as above.  Physical Exam: General: Alert, no acute distress Psychiatric: Patient is competent for consent with normal mood and affect Lymphatic: No axillary or cervical lymphadenopathy Cardiovascular: No pedal edema Respiratory: No cyanosis, no use of accessory musculature GI: No organomegaly, abdomen is soft and non-tender    Images:  '@ENCIMAGES'$ @  Labs:  Lab Results  Component Value Date   HGBA1C 5.6 08/10/2014   ESRSEDRATE 27 (H) 07/05/2015   ESRSEDRATE 80 (H) 01/21/2015   CRP 1.8 (H) 07/05/2015   LABURIC 5.9 07/28/2020   LABURIC 5.4 02/19/2018   LABURIC 6.0 08/10/2014   REPTSTATUS 01/22/2015 FINAL 01/21/2015   CULT NO GROWTH Performed at [facility]  01/21/2015    Lab Results  Component Value Date   ALBUMIN 2.6 (L) 01/31/2021   ALBUMIN 3.9 06/26/2019   ALBUMIN 3.9 10/22/2018   LABURIC 5.9 07/28/2020   LABURIC 5.4 02/19/2018   LABURIC 6.0 08/10/2014     CBC EXTENDED Latest Ref Rng & Units 04/15/2021 01/31/2021 12/12/2020  WBC 4.0 - 10.5 K/uL 8.9 2.4(L) -  RBC 4.22 - 5.81 MIL/uL 5.30 4.95 -  HGB 13.0 - 17.0 g/dL 14.3 12.9(L) 12.2(L)  HCT 39.0 - 52.0 % 44.9 41.5 36.0(L)  PLT 150 - 400 K/uL 422(H) 241 -  NEUTROABS 1.7 - 7.7 K/uL 7.7 1.6(L) -  LYMPHSABS 0.7 - 4.0  K/uL 0.5(L) 0.5(L) -    Neurologic: Patient does not have protective sensation bilateral lower extremities.   MUSCULOSKELETAL:   Skin: Examination patient has swelling of the left lower extremity worse than the right this is  unchanged.  There is pitting edema there is no crepitation no fluctuance no signs of ascending cellulitis.  Patient has a chronic draining ulcer over the medial aspect of the left foot with chronic osteomyelitis.  Radiograph shows destructive bony changes.  Patient has a palpable dorsalis pedis pulse.  The ulcer probes to bone.  Patient's white cell count is 8.9 with a hemoglobin of 14.3.  Albumin 2.6 C-reactive protein pending.  Patient has a BNP of 4500.  Assessment: Assessment: Heart failure with chronic osteomyelitis and ulceration left Charcot foot.  Plan: I agree with the IV antibiotics, will move up surgery to Tuesday if possible.  Patient is concerned that his weakness and heart failure be addressed prior to surgery.  Thank you for the consult and the opportunity to see Mr. Daleen Squibb, MD [place] [phone] 10:06 AM

## 2021-04-16 NOTE — ED Provider Notes (Signed)
[facility] EMERGENCY DEPARTMENT Provider Note   CSN: [id number] Arrival date & time: 04/15/21  1639     History Chief Complaint  Patient presents with   Recurrent Skin Infections    Stephen Whitaker is a 65 y.o. male.  Patient presents to the emergency department for generalized weakness.  Patient reports that he was unable to get off of the commode earlier today.  He has noticed over the last couple of weeks progressively worsening weakness.  He has been having trouble getting around, uses a walker or a cane.  When he sits down his extremities are too weak to get him back up.  This is what happened tonight.  He thinks he fell asleep on the commode.  Patient reports that his cardiologist has been changing his medications around to "get his heart stronger".  He takes Lasix for his chronic edema, but it sounds as though he may not always take it because it causes him to be incontinent of urine and he cannot do this while at work.  He also has missed some doses of doxycycline.  Dr. Sharol Given put him on doxycycline in an attempt to help heal an ulcer on his left foot.  He tells me that he is scheduled to have amputation this week.      Past Medical History:  Diagnosis Date   Abnormal liver function tests    Arthritis    Boil of buttock    COPD (chronic obstructive pulmonary disease) (HCC)    Chronic Bronchitis.Advair inhaler    Dyspnea    GERD (gastroesophageal reflux disease)    takes Protonix daily   Gout    takes Allopurinol daily   Hemorrhoids    Hypertension    takes HCTZ daily   Hypomagnesemia    Leg ulcer (HCC)    Mild mitral regurgitation    Pancreatitis    10 years ago   Pneumonia 2016   Tricuspid regurgitation    Tubular adenoma of colon     Patient Active Problem List   Diagnosis Date Noted   Fatigue 02/22/2021   Nonadherence to medication 02/15/2021   Asthma 01/30/2021   Other adverse food reactions, not elsewhere classified, subsequent  encounter 01/30/2021   LVH (left ventricular hypertrophy) 01/04/2021   Acute on chronic systolic heart failure ([place]) 12/08/2020   HFrEF (heart failure with reduced ejection fraction) ([place]) 10/14/2020   Nonspecific abnormal electrocardiogram (ECG) (EKG) 10/14/2020   Atrial tachycardia ([place]) 10/14/2020   Non-pressure chronic ulcer of other part of left foot limited to breakdown of skin ([place]) 10/12/2020   Leg swelling 10/06/2020   DOE (dyspnea on exertion) 10/06/2020   Thrombocytopenia ([place]) 06/26/2019   Acquired absence of other right toe(s) ([place]) 02/19/2018   Boil of buttock 10/24/2016   Amputated toe of left foot ([place]) 10/19/2016   Amputated toe, right ([place]) 06/13/2016   Chronic bronchitis ([place]) 11/24/2015   Other pancytopenia ([place]) 01/24/2015   Chronic pancreatitis ([place]) 01/24/2015   Blood poisoning    Neutropenia (HCC)    Iron deficiency anemia due to chronic blood loss    Leukopenia    Sepsis ([place]) 01/21/2015   Leucopenia 01/21/2015   Anemia 01/21/2015   Syncope 01/21/2015   Diarrhea 01/21/2015   Hypokalemia 01/21/2015   Loss of consciousness ([place])    Eczema 10/21/2014   Essential hypertension 10/21/2014   Vasovagal syncope 10/21/2014   Allergic conjunctivitis of both eyes 08/10/2014   Other chronic pancreatitis ([place]) 08/10/2014   COLD (chronic  obstructive lung disease) ([place]) 08/10/2014   Chronic ethmoidal sinusitis 08/10/2014   Bronchitis 10/17/2010   PLANTAR FASCIITIS, LEFT 10/17/2010   OTHER NEUTROPENIA 04/21/2010   CALLUS, TOE 03/15/2010   LEUKOPENIA, MILD 02/20/2010   LIVER FUNCTION TESTS, ABNORMAL, HX OF 02/16/2010   DENTAL CARIES 02/15/2010   ONYCHOMYCOSIS 07/05/2009   ALLERGIC URTICARIA 07/05/2009   PAIN IN LIMB 07/05/2009   ERECTILE DYSFUNCTION 05/25/2008   INFLUENZA 05/25/2008   VIRAL INFECTION 10/01/2007   HSV 05/02/2007   CONDYLOMA ACUMINATUM 05/02/2007   HYPERTENSION 05/02/2007   Other allergic rhinitis 05/02/2007   Chronic asthma, mild persistent,  uncomplicated [id number]   Gastroesophageal reflux disease without esophagitis 05/02/2007   PANCREATITIS 05/02/2007    Past Surgical History:  Procedure Laterality Date   AMPUTATION Left 07/14/2014   Procedure: 2nd Toe Amputation;  Surgeon: [name], MD;  Location: [place];  Service: Orthopedics;  Laterality: Left;   AMPUTATION Left 11/16/2015   Procedure: Left Great Toe Amputation at Metatarsophalangeal Joint;  Surgeon: [name], MD;  Location: [place];  Service: Orthopedics;  Laterality: Left;   AMPUTATION Right 06/13/2016   Procedure: RIGHT 2nd Toe Amputation;  Surgeon: [name], MD;  Location: [place];  Service: Orthopedics;  Laterality: Right;   AMPUTATION Left 10/19/2016   Procedure: Amputation Left 3rd Toe;  Surgeon: [name], MD;  Location: [place];  Service: Orthopedics;  Laterality: Left;   RIGHT/LEFT HEART CATH AND CORONARY ANGIOGRAPHY N/A 12/12/2020   Procedure: RIGHT/LEFT HEART CATH AND CORONARY ANGIOGRAPHY;  Surgeon: Lorretta Harp, MD;  Location: [place] [facility];  Service: Cardiovascular;  Laterality: N/A;   TOE AMPUTATION Left 11/16/2015   left great toe    TOE AMPUTATION Right 06/13/2016   TONSILLECTOMY         Family History  Adopted: Yes  Problem Relation Age of Onset   Cancer Sister 56       unknown origin     Social History   Tobacco Use   Smoking status: Never   Smokeless tobacco: Never  Vaping Use   Vaping Use: Never used  Substance Use Topics   Alcohol use: Yes    Alcohol/week: 0.0 standard drinks    Comment: rarely   Drug use: No    Home Medications Prior to Admission medications   Medication Sig Start Date End Date Taking? Authorizing Provider  albuterol (VENTOLIN HFA) 108 (90 Base) MCG/ACT inhaler Inhale 2 puffs into the lungs every 4 (four) hours as needed for wheezing or shortness of breath. 10/06/20   Tanda Rockers, MD  allopurinol (ZYLOPRIM) 300 MG tablet Take 1 tablet (300 mg total) by mouth daily. 02/15/21   Persons,  Bevely Palmer, PA  azelastine (ASTELIN) 0.1 % nasal spray Place 1-2 sprays into both nostrils 2 (two) times daily as needed (nasal drainage). Use in each nostril as directed 01/30/21   Garnet Sierras, DO  azithromycin [facility]) 250 MG tablet Take 2 tabs day one, then 1 tab daily until complete 02/28/21   Volney American, PA-C  Bioflavonoid Products (ESTER-C) TABS Take 1 tablet by mouth daily.    [provider]  Blood Pressure Monitoring DEVI 1 Units by Does not apply route daily. Measure blood pressure once a day 07/08/19   Melynda Ripple, MD  budesonide-formoterol [facility]) 160-4.5 MCG/ACT inhaler USE 2 INHALATIONS TWICE A DAY 03/13/21   Tanda Rockers, MD  Digestive Enzymes (PAPAYA AND ENZYMES) CHEW Chew 3 tablets by mouth as needed (as needed  for spicy food, indigestion, and heart burn).    [provider]  doxycycline (VIBRA-TABS) 100 MG tablet Take 1 tablet (100 mg total) by mouth 2 (two) times daily. 04/06/21   [name], MD  empagliflozin (JARDIANCE) 10 MG TABS tablet Take 1 tablet (10 mg total) by mouth daily before breakfast. 02/22/21   Chandrasekhar, Mahesh A, MD  EPINEPHrine (AUVI-Q) 0.3 mg/0.3 mL IJ SOAJ injection Inject 0.3 mg into the muscle as needed for anaphylaxis. 01/30/21   Garnet Sierras, DO  fluticasone (FLONASE) 50 MCG/ACT nasal spray Place 1 spray into both nostrils 2 (two) times daily. 04/08/20   Tanda Rockers, MD  HYDROcodone-acetaminophen (NORCO/VICODIN) 5-325 MG tablet Take 1 tablet by mouth every 6 (six) hours as needed for moderate pain. 03/14/21   Suzan Slick, NP  HYDROcodone-acetaminophen (NORCO/VICODIN) 5-325 MG tablet Take 1-2 tablets by mouth every 6 (six) hours as needed for moderate pain. 04/06/21   [name], MD  Lactase (LACTAID FAST ACT PO) Take 2 tablets by mouth as needed (with dairy food).    [provider]  loratadine (CLARITIN) 10 MG tablet Take 10 mg by mouth daily.    [provider]  Magnesium Oxide 400 MG  CAPS Take 2 capsules (800 mg total) by mouth daily. 02/28/21   [name], MD  meloxicam (MOBIC) 7.5 MG tablet Take 1 tablet by mouth once daily 01/25/21   Persons, Bevely Palmer, PA  metoprolol succinate (TOPROL XL) 25 MG 24 hr tablet Take 1 tablet (25 mg total) by mouth daily. 01/04/21   Chandrasekhar, Mahesh A, MD  MITIGARE 0.6 MG CAPS Take 1 capsule by mouth as needed. 02/02/21   [provider]  montelukast (SINGULAIR) 10 MG tablet TAKE 1 TABLET AT BEDTIME 06/27/20   Tanda Rockers, MD  Olopatadine HCl 0.2 % SOLN Apply 1 drop to eye daily as needed (itchy/watery eyes). 01/30/21   Garnet Sierras, DO  pantoprazole (PROTONIX) 40 MG tablet TAKE ONE TABLET BY MOUTH 30 TO 60 MINUTES BEFORE YOUR FIRST AND LAST MEALS OF THE DAY 01/25/21   Tanda Rockers, MD  predniSONE (DELTASONE) 20 MG tablet Take 2 tablets (40 mg total) by mouth daily with breakfast. 02/28/21   Volney American, PA-C  Probiotic Product (SUPER PROBIOTIC DIGESTIVE) CAPS Take 1 tablet by mouth as needed (stomach upset). Take as needed with meals    [provider]  sacubitril-valsartan (ENTRESTO) 24-26 MG Take 1 tablet by mouth 2 (two) times daily. 01/24/21   Chandrasekhar, Mahesh A, MD  SPIRULINA PO Take 1 capsule by mouth 3 (three) times daily.    [provider]  torsemide (DEMADEX) 20 MG tablet Take 2 tablets (40 mg total) by mouth daily. 01/25/21   [name], MD    Allergies    Ciprofloxacin, Shellfish allergy, and Sulfa antibiotics  Review of Systems   Review of Systems  Skin:  Positive for wound.  Neurological:  Positive for weakness.  All other systems reviewed and are negative.  Physical Exam Updated Vital Signs BP 125/79   Pulse 65   Temp 98.1 F (36.7 C) (Oral)   Resp 14   SpO2 100%   Physical Exam Vitals and nursing note reviewed.  Constitutional:      General: He is not in acute distress.    Appearance: Normal appearance. He is well-developed.  HENT:     Head:  Normocephalic and atraumatic.     Right Ear: Hearing normal.  Left Ear: Hearing normal.     Nose: Nose normal.  Eyes:     Conjunctiva/sclera: Conjunctivae normal.     Pupils: Pupils are equal, round, and reactive to light.  Cardiovascular:     Rate and Rhythm: Regular rhythm.     Heart sounds: S1 normal and S2 normal. No murmur heard.   No friction rub. No gallop.  Pulmonary:     Effort: Pulmonary effort is normal. No respiratory distress.     Breath sounds: Normal breath sounds.  Chest:     Chest wall: No tenderness.  Abdominal:     General: Bowel sounds are normal.     Palpations: Abdomen is soft.     Tenderness: There is no abdominal tenderness. There is no guarding or rebound. Negative signs include Murphy's sign and McBurney's sign.     Hernia: No hernia is present.  Musculoskeletal:        General: Normal range of motion.     Cervical back: Normal range of motion and neck supple.     Right lower leg: Edema present.     Left lower leg: Edema present.  Skin:    General: Skin is warm and dry.     Findings: Wound (medial aspect of left midfoot) present. No rash.  Neurological:     Mental Status: He is alert and oriented to person, place, and time.     GCS: GCS eye subscore is 4. GCS verbal subscore is 5. GCS motor subscore is 6.     Cranial Nerves: No cranial nerve deficit.     Sensory: No sensory deficit.     Coordination: Coordination normal.  Psychiatric:        Speech: Speech normal.        Behavior: Behavior normal.        Thought Content: Thought content normal.    ED Results / Procedures / Treatments   Labs (all labs ordered are listed, but only abnormal results are displayed) Labs Reviewed  CBC WITH DIFFERENTIAL/PLATELET - Abnormal; Notable for the following components:      Result Value   RDW 17.2 (*)    Platelets 422 (*)    Lymphs Abs 0.5 (*)    All other components within normal limits  BASIC METABOLIC PANEL - Abnormal; Notable for the following  components:   Sodium 134 (*)    BUN 49 (*)    Creatinine, Ser 2.18 (*)    Calcium 8.7 (*)    GFR, Estimated 33 (*)    All other components within normal limits  BRAIN NATRIURETIC PEPTIDE - Abnormal; Notable for the following components:   B Natriuretic Peptide >4,500.0 (*)    All other components within normal limits  CULTURE, BLOOD (SINGLE)  URINE CULTURE  RESP PANEL BY RT-PCR (FLU A&B, COVID) ARPGX2  LACTIC ACID, PLASMA  MAGNESIUM  URINALYSIS, ROUTINE W REFLEX MICROSCOPIC    EKG EKG Interpretation  Date/Time:  Saturday April 15 2021 16:57:44 EDT Ventricular Rate:  112 PR Interval:  148 QRS Duration: 86 QT Interval:  360 QTC Calculation: 491 R Axis:   69 Text Interpretation: Sinus tachycardia Possible Left atrial enlargement Minimal voltage criteria for LVH, may be normal variant ( Cornell product ) ST & T wave abnormality, consider lateral ischemia Abnormal ECG Confirmed by Orpah Greek [phone]) on 04/16/2021 3:30:06 AM  Radiology DG Chest 2 View  Result Date: 04/15/2021 CLINICAL DATA:  Shortness of breath and edema. EXAM: CHEST - 2 VIEW COMPARISON:  October 07, 2020 FINDINGS: The heart size and mediastinal contours are within normal limits. Both lungs are clear. The visualized skeletal structures are unremarkable. IMPRESSION: No active cardiopulmonary disease. Electronically Signed   By: Virgina Norfolk M.D.   On: 04/15/2021 17:41   DG Foot 2 Views Left  Result Date: 04/16/2021 CLINICAL DATA:  Recent amputation.  Unable to get off of toilet. EXAM: LEFT FOOT - 2 VIEW COMPARISON:  04/06/2021 FINDINGS: Marked diffuse soft tissue swelling. Status post first and third amputation at the level of the MCP joints. Status post amputation of the second ray at the level of the head the metacarpal bone. Chronic neuropathic changes involving the hindfoot with collapse of the talar dome suspected There is a gas containing wound along the medial aspect of the midfoot overlying the  navicular bone with signs of cortical destruction and erosion of the medial cortex of the navicular bone which appear progressive compared with 04/06/2021. IMPRESSION: 1. Findings compatible with progressive osteomyelitis of the medial aspect of the navicular bone with overlying soft tissue ulceration. 2. Marked diffuse soft tissue swelling. 3. Chronic advanced arthropathic changes of the hindfoot, likely neuropathic. Electronically Signed   By: Kerby Moors M.D.   On: 04/16/2021 05:01    Procedures Procedures   Medications Ordered in ED Medications  piperacillin-tazobactam (ZOSYN) IVPB 3.375 g (has no administration in time range)    ED Course  I have reviewed the triage vital signs and the nursing notes.  Pertinent labs & imaging results that were available during my care of the patient were reviewed by me and considered in my medical decision making (see chart for details).    MDM Rules/Calculators/A&P                           Patient presents to the emergency department for evaluation of generalized weakness.  Patient reports over the last 2 weeks he has been experiencing difficulty doing his activities of daily life.  He is having significant trouble ambulating.  He now cannot get up out of a seated position because of weakness.  He was found seated on the commode tonight, unable to get up.  Patient is currently being treated for diabetic foot infection secondary to Charcot's joint.  He is on doxycycline but admits he has missed some doses.  Patient with significant edema of both lower extremities and an open wound in the midfoot on the left side.  No active drainage.  X-ray consistent with progressive osteomyelitis.  Patient will require hospitalization.  Will initiate antibiotic coverage.  Anticipate Dr. Sharol Given going forward with planned amputation.   Final Clinical Impression(s) / ED Diagnoses Final diagnoses:  Infection  Diabetic foot infection ([place])    Rx / DC Orders ED  Discharge Orders     None        Leandra Vanderweele, Gwenyth Allegra, MD 04/16/21 [phone]

## 2021-04-16 NOTE — ED Notes (Signed)
Some of this pt's meds were not given by the previous RN bc those meds are not verified by pharmacy. This RN made pharmacy aware.

## 2021-04-16 NOTE — ED Notes (Signed)
Bedside report given to [place], RN

## 2021-04-16 NOTE — Consult Note (Signed)
ORTHOPAEDIC CONSULTATION  REQUESTING PHYSICIAN: Karmen Bongo, MD  Chief Complaint: Weakness.Marland Kitchen  HPI: Stephen Whitaker is a 65 y.o. male who presents with weakness and chronic osteomyelitis of the left foot with Charcot collapse.  Has been scheduled for a left transtibial amputation this Wednesday.  Patient was found down in his apartment with weakness unable to get up.  Patient denies any acute chest pain.  Patient denies any increased foot pain or symptoms.  Past Medical History:  Diagnosis Date   Abnormal liver function tests    Arthritis    Boil of buttock    COPD (chronic obstructive pulmonary disease) (HCC)    Chronic Bronchitis.Advair inhaler    Dyspnea    GERD (gastroesophageal reflux disease)    takes Protonix daily   Gout    takes Allopurinol daily   Hemorrhoids    Hypertension    takes HCTZ daily   Hypomagnesemia    Leg ulcer (HCC)    Mild mitral regurgitation    Pancreatitis    10 years ago   Pneumonia 2016   Tricuspid regurgitation    Tubular adenoma of colon    Past Surgical History:  Procedure Laterality Date   AMPUTATION Left 07/14/2014   Procedure: 2nd Toe Amputation;  Surgeon: [name], MD;  Location: [facility];  Service: Orthopedics;  Laterality: Left;   AMPUTATION Left 11/16/2015   Procedure: Left Great Toe Amputation at Metatarsophalangeal Joint;  Surgeon: [name], MD;  Location: [facility];  Service: Orthopedics;  Laterality: Left;   AMPUTATION Right 06/13/2016   Procedure: RIGHT 2nd Toe Amputation;  Surgeon: [name], MD;  Location: [facility];  Service: Orthopedics;  Laterality: Right;   AMPUTATION Left 10/19/2016   Procedure: Amputation Left 3rd Toe;  Surgeon: [name], MD;  Location: [facility];  Service: Orthopedics;  Laterality: Left;   RIGHT/LEFT HEART CATH AND CORONARY ANGIOGRAPHY N/A 12/12/2020   Procedure: RIGHT/LEFT HEART CATH AND CORONARY ANGIOGRAPHY;  Surgeon: Lorretta Harp, MD;  Location: [facility];  Service:  Cardiovascular;  Laterality: N/A;   TOE AMPUTATION Left 11/16/2015   left great toe    TOE AMPUTATION Right 06/13/2016   TONSILLECTOMY     Social History   Socioeconomic History   Marital status: Single    Spouse name: Not on file   Number of children: 1   Years of education:     Highest education level: Not on file  Occupational History   Not on file  Tobacco Use   Smoking status: Never   Smokeless tobacco: Never  Vaping Use   Vaping Use: Never used  Substance and Sexual Activity   Alcohol use: Yes    Alcohol/week: 0.0 standard drinks    Comment: rarely   Drug use: No   Sexual activity: Not Currently  Other Topics Concern   Not on file  Social History Narrative   Not on file   Social Determinants of Health   Financial Resource Strain: Not on file  Food Insecurity: Not on file  Transportation Needs: Not on file  Physical Activity: Not on file  Stress: Not on file  Social Connections: Not on file   Family History  Adopted: Yes  Problem Relation Age of Onset   Cancer Sister 78       unknown origin    - negative except otherwise stated in the family history section Allergies  Allergen Reactions   Ciprofloxacin Rash and Other (See Comments)  SYNCOPE    Shellfish Allergy Anaphylaxis   Sulfa Antibiotics Other (See Comments)    SYNCOPE DIZZINESS    Prior to Admission medications   Medication Sig Start Date End Date Taking? Authorizing Provider  albuterol (VENTOLIN HFA) 108 (90 Base) MCG/ACT inhaler Inhale 2 puffs into the lungs every 4 (four) hours as needed for wheezing or shortness of breath. 10/06/20  Yes Tanda Rockers, MD  allopurinol (ZYLOPRIM) 300 MG tablet Take 1 tablet (300 mg total) by mouth daily. 02/15/21  Yes Persons, Bevely Palmer, PA  budesonide-formoterol [facility]) 160-4.5 MCG/ACT inhaler USE 2 INHALATIONS TWICE A DAY 03/13/21  Yes Tanda Rockers, MD  Digestive Enzymes (PAPAYA AND ENZYMES) CHEW Chew 3 tablets by mouth as needed (as needed for  spicy food, indigestion, and heart burn).   Yes [provider]  doxycycline (VIBRA-TABS) 100 MG tablet Take 1 tablet (100 mg total) by mouth 2 (two) times daily. 04/06/21  Yes [name], MD  fluticasone (FLONASE) 50 MCG/ACT nasal spray Place 1 spray into both nostrils 2 (two) times daily. 04/08/20  Yes Tanda Rockers, MD  HYDROcodone-acetaminophen (NORCO/VICODIN) 5-325 MG tablet Take 1-2 tablets by mouth every 6 (six) hours as needed for moderate pain. 04/06/21  Yes [name], MD  Lactase (LACTAID FAST ACT PO) Take 2 tablets by mouth as needed (with dairy food).   Yes [provider]  Magnesium Oxide 400 MG CAPS Take 2 capsules (800 mg total) by mouth daily. 02/28/21  Yes Chandrasekhar, Mahesh A, MD  metoprolol succinate (TOPROL XL) 25 MG 24 hr tablet Take 1 tablet (25 mg total) by mouth daily. 01/04/21  Yes Chandrasekhar, Mahesh A, MD  mometasone-formoterol (DULERA) 200-5 MCG/ACT AERO Inhale 2 puffs into the lungs daily as needed for wheezing.   Yes [provider]  montelukast (SINGULAIR) 10 MG tablet TAKE 1 TABLET AT BEDTIME 06/27/20  Yes Tanda Rockers, MD  pantoprazole (PROTONIX) 40 MG tablet TAKE ONE TABLET BY MOUTH 30 TO 60 MINUTES BEFORE YOUR FIRST AND LAST MEALS OF THE DAY Patient taking differently: Take 40 mg by mouth 2 (two) times daily. 01/25/21  Yes Tanda Rockers, MD  predniSONE (DELTASONE) 20 MG tablet Take 2 tablets (40 mg total) by mouth daily with breakfast. 02/28/21  Yes Volney American, PA-C  Probiotic Product (SUPER PROBIOTIC DIGESTIVE) CAPS Take 1 tablet by mouth as needed (stomach upset). Take as needed with meals   Yes [provider]  sacubitril-valsartan (ENTRESTO) 24-26 MG Take 1 tablet by mouth 2 (two) times daily. 01/24/21  Yes Chandrasekhar, Mahesh A, MD  SPIRULINA PO Take 1 capsule by mouth 2 (two) times daily with a meal.   Yes [provider]  torsemide (DEMADEX) 20 MG tablet Take 2 tablets (40 mg total) by mouth  daily. 01/25/21  Yes Chandrasekhar, Mahesh A, MD  Trolamine Salicylate (BLUE-EMU HEMP EX) Apply 1 application topically daily as needed.   Yes [provider]  azelastine (ASTELIN) 0.1 % nasal spray Place 1-2 sprays into both nostrils 2 (two) times daily as needed (nasal drainage). Use in each nostril as directed Patient not taking: No sig reported 01/30/21   Garnet Sierras, DO  Blood Pressure Monitoring DEVI 1 Units by Does not apply route daily. Measure blood pressure once a day 07/08/19   Melynda Ripple, MD  EPINEPHrine (AUVI-Q) 0.3 mg/0.3 mL IJ SOAJ injection Inject 0.3 mg into the muscle as needed for anaphylaxis. Patient not taking: No sig reported 01/30/21  Garnet Sierras, DO  MITIGARE 0.6 MG CAPS Take 1 capsule by mouth as needed. Patient not taking: No sig reported 02/02/21   [provider]  Olopatadine HCl 0.2 % SOLN Apply 1 drop to eye daily as needed (itchy/watery eyes). Patient not taking: No sig reported 01/30/21   Garnet Sierras, DO   DG Chest 2 View  Result Date: 04/15/2021 CLINICAL DATA:  Shortness of breath and edema. EXAM: CHEST - 2 VIEW COMPARISON:  October 07, 2020 FINDINGS: The heart size and mediastinal contours are within normal limits. Both lungs are clear. The visualized skeletal structures are unremarkable. IMPRESSION: No active cardiopulmonary disease. Electronically Signed   By: Virgina Norfolk M.D.   On: 04/15/2021 17:41   DG Foot 2 Views Left  Result Date: 04/16/2021 CLINICAL DATA:  Recent amputation.  Unable to get off of toilet. EXAM: LEFT FOOT - 2 VIEW COMPARISON:  04/06/2021 FINDINGS: Marked diffuse soft tissue swelling. Status post first and third amputation at the level of the MCP joints. Status post amputation of the second ray at the level of the head the metacarpal bone. Chronic neuropathic changes involving the hindfoot with collapse of the talar dome suspected There is a gas containing wound along the medial aspect of the midfoot overlying the  navicular bone with signs of cortical destruction and erosion of the medial cortex of the navicular bone which appear progressive compared with 04/06/2021. IMPRESSION: 1. Findings compatible with progressive osteomyelitis of the medial aspect of the navicular bone with overlying soft tissue ulceration. 2. Marked diffuse soft tissue swelling. 3. Chronic advanced arthropathic changes of the hindfoot, likely neuropathic. Electronically Signed   By: Kerby Moors M.D.   On: 04/16/2021 05:01   - pertinent xrays, CT, MRI studies were reviewed and independently interpreted  Positive ROS: All other systems have been reviewed and were otherwise negative with the exception of those mentioned in the HPI and as above.  Physical Exam: General: Alert, no acute distress Psychiatric: Patient is competent for consent with normal mood and affect Lymphatic: No axillary or cervical lymphadenopathy Cardiovascular: No pedal edema Respiratory: No cyanosis, no use of accessory musculature GI: No organomegaly, abdomen is soft and non-tender    Images:  '@ENCIMAGES'$ @  Labs:  Lab Results  Component Value Date   HGBA1C 5.6 08/10/2014   ESRSEDRATE 27 (H) 07/05/2015   ESRSEDRATE 80 (H) 01/21/2015   CRP 1.8 (H) 07/05/2015   LABURIC 5.9 07/28/2020   LABURIC 5.4 02/19/2018   LABURIC 6.0 08/10/2014   REPTSTATUS 01/22/2015 FINAL 01/21/2015   CULT NO GROWTH Performed at [facility]  01/21/2015    Lab Results  Component Value Date   ALBUMIN 2.6 (L) 01/31/2021   ALBUMIN 3.9 06/26/2019   ALBUMIN 3.9 10/22/2018   LABURIC 5.9 07/28/2020   LABURIC 5.4 02/19/2018   LABURIC 6.0 08/10/2014     CBC EXTENDED Latest Ref Rng & Units 04/15/2021 01/31/2021 12/12/2020  WBC 4.0 - 10.5 K/uL 8.9 2.4(L) -  RBC 4.22 - 5.81 MIL/uL 5.30 4.95 -  HGB 13.0 - 17.0 g/dL 14.3 12.9(L) 12.2(L)  HCT 39.0 - 52.0 % 44.9 41.5 36.0(L)  PLT 150 - 400 K/uL 422(H) 241 -  NEUTROABS 1.7 - 7.7 K/uL 7.7 1.6(L) -  LYMPHSABS 0.7 - 4.0  K/uL 0.5(L) 0.5(L) -    Neurologic: Patient does not have protective sensation bilateral lower extremities.   MUSCULOSKELETAL:   Skin: Examination patient has swelling of the left lower extremity worse than the right this is  unchanged.  There is pitting edema there is no crepitation no fluctuance no signs of ascending cellulitis.  Patient has a chronic draining ulcer over the medial aspect of the left foot with chronic osteomyelitis.  Radiograph shows destructive bony changes.  Patient has a palpable dorsalis pedis pulse.  The ulcer probes to bone.  Patient's white cell count is 8.9 with a hemoglobin of 14.3.  Albumin 2.6 C-reactive protein pending.  Patient has a BNP of 4500.  Assessment: Assessment: Heart failure with chronic osteomyelitis and ulceration left Charcot foot.  Plan: I agree with the IV antibiotics, will move up surgery to Tuesday if possible.  Patient is concerned that his weakness and heart failure be addressed prior to surgery.  Thank you for the consult and the opportunity to see Mr. Daleen Squibb, MD [place] [phone] 10:06 AM

## 2021-04-16 NOTE — Progress Notes (Signed)
Pharmacy Antibiotic Note  Stephen Whitaker is a 65 y.o. male admitted on 04/15/2021 with weakness/AKI, diabetic foot wound/ osteomyeltitis .  Pharmacy has been consulted for Vancomycin dosing.  Plan: Vancomycin 1500 mg IV now, then Vancomycin 1000 mg IV q24h   Temp (24hrs), Avg:98.1 F (36.7 C), Min:98.1 F (36.7 C), Max:98.1 F (36.7 C)  Recent Labs  Lab 04/15/21 1714  WBC 8.9  CREATININE 2.18*    CrCl cannot be calculated (Unknown ideal weight.).    Allergies  Allergen Reactions   Ciprofloxacin Rash and Other (See Comments)    SYNCOPE    Shellfish Allergy Anaphylaxis   Sulfa Antibiotics Other (See Comments)    SYNCOPE DIZZINESS      Caryl Pina 04/16/2021 7:23 AM

## 2021-04-17 ENCOUNTER — Encounter (HOSPITAL_COMMUNITY): Payer: BC Managed Care – PPO

## 2021-04-17 ENCOUNTER — Other Ambulatory Visit: Payer: Self-pay | Admitting: Physician Assistant

## 2021-04-17 ENCOUNTER — Encounter (HOSPITAL_COMMUNITY): Payer: Self-pay | Admitting: Internal Medicine

## 2021-04-17 DIAGNOSIS — I5020 Unspecified systolic (congestive) heart failure: Secondary | ICD-10-CM

## 2021-04-17 DIAGNOSIS — L8990 Pressure ulcer of unspecified site, unspecified stage: Secondary | ICD-10-CM | POA: Insufficient documentation

## 2021-04-17 DIAGNOSIS — E43 Unspecified severe protein-calorie malnutrition: Secondary | ICD-10-CM | POA: Insufficient documentation

## 2021-04-17 LAB — BASIC METABOLIC PANEL
Anion gap: 9 (ref 5–15)
BUN: 45 mg/dL — ABNORMAL HIGH (ref 8–23)
CO2: 26 mmol/L (ref 22–32)
Calcium: 8.3 mg/dL — ABNORMAL LOW (ref 8.9–10.3)
Chloride: 101 mmol/L (ref 98–111)
Creatinine, Ser: 1.82 mg/dL — ABNORMAL HIGH (ref 0.61–1.24)
GFR, Estimated: 40 mL/min — ABNORMAL LOW (ref >60)
Glucose, Bld: 79 mg/dL (ref 70–99)
Potassium: 3.8 mmol/L (ref 3.5–5.1)
Sodium: 136 mmol/L (ref 135–145)

## 2021-04-17 LAB — CBC
HCT: 39.7 % (ref 39.0–52.0)
Hemoglobin: 12.8 g/dL — ABNORMAL LOW (ref 13.0–17.0)
MCH: 26.9 pg (ref 26.0–34.0)
MCHC: 32.2 g/dL (ref 30.0–36.0)
MCV: 83.6 fL (ref 80.0–100.0)
Platelets: 341 10*3/uL (ref 150–400)
RBC: 4.75 MIL/uL (ref 4.22–5.81)
RDW: 16.7 % — ABNORMAL HIGH (ref 11.5–15.5)
WBC: 4.7 10*3/uL (ref 4.0–10.5)
nRBC: 0 % (ref 0.0–0.2)

## 2021-04-17 MED ORDER — ADULT MULTIVITAMIN W/MINERALS CH
1.0000 | ORAL_TABLET | Freq: Every day | ORAL | Status: DC
  Administered 2021-04-17 – 2021-05-02 (×15): 1 via ORAL
  Filled 2021-04-17 (×16): qty 1

## 2021-04-17 MED ORDER — SACCHAROMYCES BOULARDII 250 MG PO CAPS
250.0000 mg | ORAL_CAPSULE | Freq: Two times a day (BID) | ORAL | Status: AC
  Administered 2021-04-17 – 2021-04-18 (×2): 250 mg via ORAL
  Filled 2021-04-17 (×2): qty 1

## 2021-04-17 MED ORDER — ENSURE ENLIVE PO LIQD
237.0000 mL | Freq: Three times a day (TID) | ORAL | Status: DC
  Administered 2021-04-17: 237 mL via ORAL

## 2021-04-17 MED ORDER — SODIUM CHLORIDE 0.9 % IV SOLN: INTRAVENOUS | Status: DC

## 2021-04-17 MED ORDER — VANCOMYCIN HCL 1250 MG/250ML IV SOLN
1250.0000 mg | INTRAVENOUS | Status: DC
  Administered 2021-04-18: 1250 mg via INTRAVENOUS
  Filled 2021-04-17: qty 250

## 2021-04-17 NOTE — Progress Notes (Signed)
PROGRESS NOTE   MANUAL BROAS  [id number]    DOB: March 18, 1956    DOA: 04/15/2021  PCP: [name], NP   I have briefly reviewed patients previous medical records in [facility].  Chief Complaint  Patient presents with   Recurrent Skin Infections    Brief Narrative:  65 year old male with medical history significant for COPD, chronic systolic CHF (LVEF 0000000), DM 2 and HTN presented with left foot infection.  He has been followed by Dr. Sharol Given for his chronic left Charcot foot ulceration and has been on doxycycline with wound care.  He was last seen on 8/25 and was planning for BKA on 8/31 but on day of admission he was found to be too weak when presented to the ED.  Reported unstable living situation.  Admitted for chronic osteomyelitis and ulceration of left Charcot foot.  Orthopedics consulted, plan for surgery possibly 8/31.  Cardiology consulted for preop clearance and CHF management   Assessment & Plan:  Principal Problem:   Osteomyelitis of left foot (HCC) Active Problems:   COLD (chronic obstructive lung disease) (HCC)   Essential hypertension   HFrEF (heart failure with reduced ejection fraction) (HCC)   Fatigue   Pressure injury of skin   Protein-calorie malnutrition, severe   Chronic osteomyelitis and ulceration of left Charcot foot with prior amputations: Dr. Sharol Given consulted.  Continue IV antibiotics (Rocephin/Flagyl/vancomycin).  Cardiology consulted for preop clearance.  Plan for BKA on 8/31.  Foot ulcer draining on admission.  Per cardiology, at least moderate risk for surgery.  Chronic combined systolic and diastolic CHF/nonischemic cardiomyopathy: LV EF 20-25% and grade 2 diastolic dysfunction.  Continue beta-blockers.  Holding [place], [place] and torsemide due to AKI.  Reportedly stopped taking his medications recently.  Cardiology initiating IV diuretic tomorrow in anticipation of surgery the next day.  They will work on volume status  optimization.  Acute kidney injury complicating stage IIIb CKD: Presented with creatinine of 2.18 which is improved to 1.82.  Baseline creatinine is fluctuating but may be between 1.2-1.3.  Holding torsemide and Entresto.  Follow daily BMP.  On gentle IVF.  SVT/atrial tachycardia: Continue beta-blockers.  COPD: Stable without clinical bronchospasm.  Continue Singulair and Dulera.  Essential hypertension: Controlled.  Continue Toprol-XL.  Holding [place].  Gout: No acute flare.  Continue allopurinol  Social issues Patient reportedly has lost his housing and also needs TOC team assisted with disability paperwork.  May need SNF post amputation.  Body mass index is 25.68 kg/m.  Nutritional Status Nutrition Problem: Severe Malnutrition Etiology: acute illness (lt foot osteomyelitis) Signs/Symptoms: moderate fat depletion, moderate muscle depletion, edema Interventions: Ensure Enlive (each supplement provides 350kcal and 20 grams of protein), Magic cup, MVI  Pressure Ulcer:     DVT prophylaxis: Place and maintain sequential compression device Start: 04/16/21 1913     Code Status: Full Code Family Communication: Discussed in detail with patient's son at bedside Disposition:  Status is: Inpatient  Remains inpatient appropriate because:Inpatient level of care appropriate due to severity of illness  Dispo: The patient is from: Home              Anticipated d/c is to:  TBD              Patient currently is not medically stable to d/c.   Difficult to place patient No        Consultants:   Orthopedics Cardiology.  Procedures:   None  Antimicrobials:    Anti-infectives (From admission,  onward)    Start     Dose/Rate Route Frequency Ordered Stop   04/18/21 0600  vancomycin (VANCOREADY) IVPB 1250 mg/250 mL        1,250 mg 166.7 mL/hr over 90 Minutes Intravenous Every 24 hours 04/17/21 1319     04/17/21 0800  vancomycin (VANCOCIN) IVPB 1000 mg/200 mL premix  Status:   Discontinued        1,000 mg 200 mL/hr over 60 Minutes Intravenous Every 24 hours 04/16/21 0726 04/17/21 1319   04/16/21 1430  cefTRIAXone (ROCEPHIN) 2 g in sodium chloride 0.9 % 100 mL IVPB        2 g 200 mL/hr over 30 Minutes Intravenous Every 24 hours 04/16/21 0925 04/23/21 0959   04/16/21 1430  metroNIDAZOLE (FLAGYL) IVPB 500 mg        500 mg 100 mL/hr over 60 Minutes Intravenous Every 8 hours 04/16/21 0925 04/23/21 1429   04/16/21 0615  vancomycin (VANCOREADY) IVPB 1500 mg/300 mL        1,500 mg 150 mL/hr over 120 Minutes Intravenous  Once 04/16/21 0609 04/16/21 0910   04/16/21 0600  piperacillin-tazobactam (ZOSYN) IVPB 3.375 g        3.375 g 100 mL/hr over 30 Minutes Intravenous  Once 04/16/21 0555 04/16/21 0708         Subjective:  Seen this morning.  Son at bedside.  Reports feeling weak and has not been taking his oral meds.  Ongoing left foot pain.  Objective:   Vitals:   04/17/21 0841 04/17/21 1256 04/17/21 1348 04/17/21 1613  BP: 111/86 (!) 88/69 95/73 102/63  Pulse: 99 92 92 98  Resp: '14 16  14  '$ Temp: 98 F (36.7 C) 98.2 F (36.8 C)  98 F (36.7 C)  TempSrc: Oral Oral  Oral  SpO2:  100%    Weight:      Height:        General exam: Middle-age male, looks older than stated age, chronically ill looking, lying comfortably propped up in bed without distress. Respiratory system: Clear to auscultation. Respiratory effort normal. Cardiovascular system: S1 & S2 heard, RRR. No JVD, murmurs, rubs, gallops or clicks. No pedal edema. Gastrointestinal system: Abdomen is nondistended, soft and nontender. No organomegaly or masses felt. Normal bowel sounds heard. Central nervous system: Alert and oriented. No focal neurological deficits. Extremities: Symmetric 5 x 5 power.  Left foot exam as per pictures below taken on day of admission 8/27.  Currently dressed and did not take down dressing to examine. Skin: No rashes, lesions or ulcers Psychiatry: Judgement and insight  appear somewhat impaired.. Mood & affect appropriate.        Data Reviewed:   I have personally reviewed following labs and imaging studies   CBC: Recent Labs  Lab 04/15/21 1714 04/17/21 0414  WBC 8.9 4.7  NEUTROABS 7.7  --   HGB 14.3 12.8*  HCT 44.9 39.7  MCV 84.7 83.6  PLT 422* [id number]    Basic Metabolic Panel: Recent Labs  Lab 04/15/21 1714 04/16/21 0411 04/16/21 1335 04/17/21 0414  NA 134*  --   --  136  K 4.8  --   --  3.8  CL 98  --   --  101  CO2 26  --   --  26  GLUCOSE 93  --   --  79  BUN 49*  --   --  45*  CREATININE 2.18*  --  1.94* 1.82*  CALCIUM 8.7*  --   --  8.3*  MG  --  1.8  --   --     Liver Function Tests: No results for input(s): AST, ALT, ALKPHOS, BILITOT, PROT, ALBUMIN in the last 168 hours.  CBG: No results for input(s): GLUCAP in the last 168 hours.  Microbiology Studies:   Recent Results (from the past 240 hour(s))  Culture, blood (single) w Reflex to ID Panel     Status: None (Preliminary result)   Collection Time: 04/16/21  5:52 AM   Specimen: BLOOD  Result Value Ref Range Status   Specimen Description BLOOD SITE NOT SPECIFIED  Final   Special Requests   Final    BOTTLES DRAWN AEROBIC AND ANAEROBIC Blood Culture results may not be optimal due to an inadequate volume of blood received in culture bottles   Culture   Final    NO GROWTH 1 DAY Performed at [facility], [place] [address] [place], [place] [address]    Report Status PENDING  Incomplete  Resp Panel by RT-PCR (Flu A&B, Covid) Nasopharyngeal Swab     Status: None   Collection Time: 04/16/21  7:11 AM   Specimen: Nasopharyngeal Swab; Nasopharyngeal(NP) swabs in vial transport medium  Result Value Ref Range Status   SARS Coronavirus 2 by RT PCR NEGATIVE NEGATIVE Final    Comment: (NOTE) SARS-CoV-2 target nucleic acids are NOT DETECTED.  The SARS-CoV-2 RNA is generally detectable in upper respiratory specimens during the acute phase of infection. The  lowest concentration of SARS-CoV-2 viral copies this assay can detect is 138 copies/mL. A negative result does not preclude SARS-Cov-2 infection and should not be used as the sole basis for treatment or other patient management decisions. A negative result may occur with  improper specimen collection/handling, submission of specimen other than nasopharyngeal swab, presence of viral mutation(s) within the areas targeted by this assay, and inadequate number of viral copies(<138 copies/mL). A negative result must be combined with clinical observations, patient history, and epidemiological information. The expected result is Negative.  Fact Sheet for Patients:  EntrepreneurPulse.com.au  Fact Sheet for Healthcare Providers:  IncredibleEmployment.be  This test is no t yet approved or cleared by the [place] FDA and  has been authorized for detection and/or diagnosis of SARS-CoV-2 by FDA under an Emergency Use Authorization (EUA). This EUA will remain  in effect (meaning this test can be used) for the duration of the COVID-19 declaration under Section 564(b)(1) of the Act, 21 U.S.C.section 360bbb-3(b)(1), unless the authorization is terminated  or revoked sooner.       Influenza A by PCR NEGATIVE NEGATIVE Final   Influenza B by PCR NEGATIVE NEGATIVE Final    Comment: (NOTE) The Xpert Xpress SARS-CoV-2/FLU/RSV plus assay is intended as an aid in the diagnosis of influenza from Nasopharyngeal swab specimens and should not be used as a sole basis for treatment. Nasal washings and aspirates are unacceptable for Xpert Xpress SARS-CoV-2/FLU/RSV testing.  Fact Sheet for Patients: EntrepreneurPulse.com.au  Fact Sheet for Healthcare Providers: IncredibleEmployment.be  This test is not yet approved or cleared by the [place] FDA and has been authorized for detection and/or diagnosis of SARS-CoV-2 by FDA under  an Emergency Use Authorization (EUA). This EUA will remain in effect (meaning this test can be used) for the duration of the COVID-19 declaration under Section 564(b)(1) of the Act, 21 U.S.C. section 360bbb-3(b)(1), unless the authorization is terminated or revoked.  Performed at [facility], [place] [address] [place], [place] [address]   Blood Cultures  x 2 sites     Status: None (Preliminary result)   Collection Time: 04/16/21 10:38 AM   Specimen: BLOOD  Result Value Ref Range Status   Specimen Description BLOOD SITE NOT SPECIFIED  Final   Special Requests   Final    BOTTLES DRAWN AEROBIC AND ANAEROBIC Blood Culture adequate volume   Culture   Final    NO GROWTH < 24 HOURS Performed at [facility], [place] [address], [place], [place] [address]    Report Status PENDING  Incomplete  Blood Cultures x 2 sites     Status: None (Preliminary result)   Collection Time: 04/16/21 10:46 AM   Specimen: BLOOD  Result Value Ref Range Status   Specimen Description BLOOD SITE NOT SPECIFIED  Final   Special Requests   Final    BOTTLES DRAWN AEROBIC AND ANAEROBIC Blood Culture adequate volume   Culture   Final    NO GROWTH < 24 HOURS Performed at [facility], [place] [address] [place], [place] [address]    Report Status PENDING  Incomplete     Radiology Studies:  DG Chest 2 View  Result Date: 04/15/2021 CLINICAL DATA:  Shortness of breath and edema. EXAM: CHEST - 2 VIEW COMPARISON:  October 07, 2020 FINDINGS: The heart size and mediastinal contours are within normal limits. Both lungs are clear. The visualized skeletal structures are unremarkable. IMPRESSION: No active cardiopulmonary disease. Electronically Signed   By: Virgina Norfolk M.D.   On: 04/15/2021 17:41   DG Foot 2 Views Left  Result Date: 04/16/2021 CLINICAL DATA:  Recent amputation.  Unable to get off of toilet. EXAM: LEFT FOOT - 2 VIEW COMPARISON:  04/06/2021 FINDINGS: Marked diffuse soft tissue swelling.  Status post first and third amputation at the level of the MCP joints. Status post amputation of the second ray at the level of the head the metacarpal bone. Chronic neuropathic changes involving the hindfoot with collapse of the talar dome suspected There is a gas containing wound along the medial aspect of the midfoot overlying the navicular bone with signs of cortical destruction and erosion of the medial cortex of the navicular bone which appear progressive compared with 04/06/2021. IMPRESSION: 1. Findings compatible with progressive osteomyelitis of the medial aspect of the navicular bone with overlying soft tissue ulceration. 2. Marked diffuse soft tissue swelling. 3. Chronic advanced arthropathic changes of the hindfoot, likely neuropathic. Electronically Signed   By: Kerby Moors M.D.   On: 04/16/2021 05:01     Scheduled Meds:    allopurinol  300 mg Oral Daily   docusate sodium  100 mg Oral BID   feeding supplement  237 mL Oral TID BM   fluticasone  1 spray Each Nare BID   loratadine  10 mg Oral Daily   metoprolol succinate  25 mg Oral Daily   mometasone-formoterol  2 puff Inhalation BID   montelukast  10 mg Oral QHS   multivitamin with minerals  1 tablet Oral Daily   nystatin  5 mL Oral QID   olopatadine  1 drop Both Eyes BID   pantoprazole  40 mg Oral BID    Continuous Infusions:    sodium chloride 75 mL/hr at 04/17/21 0521   cefTRIAXone (ROCEPHIN)  IV 2 g (04/17/21 1032)   metronidazole 500 mg (04/17/21 1345)   [START ON 04/18/2021] vancomycin       LOS: 1 day     Vernell Leep, MD, [place], [facility]. [facility]  To contact the attending provider between 7A-7P or the covering provider during after hours 7P-7A, please log into the web site [email or web address] and access using universal [place] password for that web site. If you do not have the password, please call the hospital operator.  04/17/2021, 5:12 PM

## 2021-04-17 NOTE — Progress Notes (Signed)
CSW received consult from [place] and CSW will address consult with Mr. Stephen Whitaker tomorrow.  Karon Heckendorn, MSW, [place] Heart Failure Social Worker

## 2021-04-17 NOTE — Progress Notes (Signed)
[facility] paged patient c/o muscle spasms requested order for muscle relaxer and patient has developed loose BM due to antibiotics request for pro biotic. Arthor Captain LPN

## 2021-04-17 NOTE — Consult Note (Addendum)
Cardiology Consultation:   Patient ID: Stephen Whitaker MRN: [id number]; DOB: February 16, 1956  Admit date: 04/15/2021 Date of Consult: 04/17/2021  PCP:  Stephen Pounds, NP   [facility] Providers Cardiologist:  Stephen Lean, Whitaker   Patient Profile:   Stephen Whitaker is Whitaker 65 y.o. male with Whitaker hx of HFrEF, HTN, DM, COPD, GERD, moderate TR, PSVT, and gout who is being seen 04/17/2021 for the evaluation of preop clearance at the request of Stephen Whitaker.  History of Present Illness:   Stephen Whitaker establish cardiology care earlier this year for lower extremity swelling and sudden weight gain and palpitations.  Echocardiogram showed EF of 20 to [id number], grade 2 diastolic dysfunction, mildly reduced RV function, severe dilation of left and right atria, small pericardial effusion, mild to moderate MR, moderate TR, and ascending aorta measuring 37 mm.  Heart monitor placed for palpitations showed short runs of SVT likely atrial tachycardia and he was started on Whitaker low-dose beta-blocker.  Right and left heart catheterization in April 2022 revealed normal coronaries.  He was last seen in May 2022 and goal-directed medical therapy has been titrated to include beta-blocker, 10 mg Farxiga, 24-26 milligrams Entresto twice daily, and 40 mg torsemide daily.   He has osteomyelitis and was scheduled for L BKA on Wednesday. He drove home from work yesterday but was too tired to get out of the car. Neighbors helped him inside, but then he could not get off the commode. His landlord called EMS. On arrival, he has missed some doses of medications, including diuretic due to incontinence, and ABX. ABX restarted and he was started on IV ABX for foot ulcer actively draining. He did not appear septic. AKI present felt due to poor PO intake. Diuretics and entresto were held and he received gentle IVF. Notes summarize that he has lost housing and there are concerns for hording. Cardiology consulted for further management.    During my interview, he denies chest pain and shortness of breath. He states he stopped cardiac medications on Friday due to muscle fatigue and weakness. He is resting comfortably without O2, but does state he thinks he may be volume up with some lower extremity swelling.    Past Medical History:  Diagnosis Date   Abnormal liver function tests    Arthritis    Boil of buttock    COPD (chronic obstructive pulmonary disease) (HCC)    Chronic Bronchitis.Advair inhaler    Dyspnea    GERD (gastroesophageal reflux disease)    takes Protonix daily   Gout    takes Allopurinol daily   Hemorrhoids    Hypertension    takes HCTZ daily   Hypomagnesemia    Leg ulcer (HCC)    Mild mitral regurgitation    Pancreatitis    10 years ago   Pneumonia 2016   Tricuspid regurgitation    Tubular adenoma of colon     Past Surgical History:  Procedure Laterality Date   AMPUTATION Left 07/14/2014   Procedure: 2nd Toe Amputation;  Surgeon: Stephen Minion, Whitaker;  Location: [place];  Service: Orthopedics;  Laterality: Left;   AMPUTATION Left 11/16/2015   Procedure: Left Great Toe Amputation at Metatarsophalangeal Joint;  Surgeon: Stephen Minion, Whitaker;  Location: [place];  Service: Orthopedics;  Laterality: Left;   AMPUTATION Right 06/13/2016   Procedure: RIGHT 2nd Toe Amputation;  Surgeon: Stephen Minion, Whitaker;  Location: [place];  Service: Orthopedics;  Laterality: Right;   AMPUTATION Left 10/19/2016  Procedure: Amputation Left 3rd Toe;  Surgeon: Stephen Minion, Whitaker;  Location: [place];  Service: Orthopedics;  Laterality: Left;   RIGHT/LEFT HEART CATH AND CORONARY ANGIOGRAPHY N/Whitaker 12/12/2020   Procedure: RIGHT/LEFT HEART CATH AND CORONARY ANGIOGRAPHY;  Surgeon: Stephen Harp, Whitaker;  Location: [place] [facility];  Service: Cardiovascular;  Laterality: N/Whitaker;   TOE AMPUTATION Left 11/16/2015   left great toe    TOE AMPUTATION Right 06/13/2016   TONSILLECTOMY       Home Medications:  Prior to Admission medications    Medication Sig Start Date End Date Taking? Authorizing Provider  albuterol (VENTOLIN HFA) 108 (90 Base) MCG/ACT inhaler Inhale 2 puffs into the lungs every 4 (four) hours as needed for wheezing or shortness of breath. 10/06/20  Yes Stephen Rockers, Whitaker  allopurinol (ZYLOPRIM) 300 MG tablet Take 1 tablet (300 mg total) by mouth daily. 02/15/21  Yes Stephen Whitaker  budesonide-formoterol [facility]) 160-4.5 MCG/ACT inhaler USE 2 INHALATIONS TWICE Whitaker DAY 03/13/21  Yes Stephen Rockers, Whitaker  Digestive Enzymes (PAPAYA AND ENZYMES) CHEW Chew 3 tablets by mouth as needed (as needed for spicy food, indigestion, and heart burn).   Yes Provider, Historical, Whitaker  doxycycline (VIBRA-TABS) 100 MG tablet Take 1 tablet (100 mg total) by mouth 2 (two) times daily. 04/06/21  Yes Stephen Minion, Whitaker  fluticasone (FLONASE) 50 MCG/ACT nasal spray Place 1 spray into both nostrils 2 (two) times daily. 04/08/20  Yes Stephen Rockers, Whitaker  HYDROcodone-acetaminophen (NORCO/VICODIN) 5-325 MG tablet Take 1-2 tablets by mouth every 6 (six) hours as needed for moderate pain. 04/06/21  Yes Stephen Minion, Whitaker  Lactase (LACTAID FAST ACT PO) Take 2 tablets by mouth as needed (with dairy food).   Yes Provider, Historical, Whitaker  Magnesium Oxide 400 MG CAPS Take 2 capsules (800 mg total) by mouth daily. 02/28/21  Yes Stephen Whitaker  metoprolol succinate (TOPROL XL) 25 MG 24 hr tablet Take 1 tablet (25 mg total) by mouth daily. 01/04/21  Yes Stephen Whitaker  mometasone-formoterol (DULERA) 200-5 MCG/ACT AERO Inhale 2 puffs into the lungs daily as needed for wheezing.   Yes Provider, Historical, Whitaker  montelukast (SINGULAIR) 10 MG tablet TAKE 1 TABLET AT BEDTIME 06/27/20  Yes Stephen Rockers, Whitaker  pantoprazole (PROTONIX) 40 MG tablet TAKE ONE TABLET BY MOUTH 30 TO 60 MINUTES BEFORE YOUR FIRST AND LAST MEALS OF THE DAY Patient taking differently: Take 40 mg by mouth 2 (two) times daily. 01/25/21  Yes Stephen Rockers, Whitaker  Probiotic  Product (SUPER PROBIOTIC DIGESTIVE) CAPS Take 1 tablet by mouth as needed (stomach upset). Take as needed with meals   Yes Provider, Historical, Whitaker  sacubitril-valsartan (ENTRESTO) 24-26 MG Take 1 tablet by mouth 2 (two) times daily. 01/24/21  Yes Stephen Whitaker  SPIRULINA PO Take 1 capsule by mouth 2 (two) times daily with Whitaker meal.   Yes Provider, Historical, Whitaker  torsemide (DEMADEX) 20 MG tablet Take 2 tablets (40 mg total) by mouth daily. 01/25/21  Yes Stephen Whitaker  Trolamine Salicylate (BLUE-EMU HEMP EX) Apply 1 application topically daily as needed.   Yes Provider, Historical, Whitaker  azelastine (ASTELIN) 0.1 % nasal spray Place 1-2 sprays into both nostrils 2 (two) times daily as needed (nasal drainage). Use in each nostril as directed Patient not taking: No sig reported 01/30/21   Garnet Sierras, DO  Blood Pressure Monitoring DEVI 1 Units by Does not apply  route daily. Measure blood pressure once Whitaker day 07/08/19   Melynda Ripple, Whitaker  EPINEPHrine (AUVI-Q) 0.3 mg/0.3 mL IJ SOAJ injection Inject 0.3 mg into the muscle as needed for anaphylaxis. Patient not taking: No sig reported 01/30/21   Garnet Sierras, DO  MITIGARE 0.6 MG CAPS Take 1 capsule by mouth as needed. Patient not taking: No sig reported 02/02/21   Provider, Historical, Whitaker  Olopatadine HCl 0.2 % SOLN Apply 1 drop to eye daily as needed (itchy/watery eyes). Patient not taking: No sig reported 01/30/21   Garnet Sierras, DO    Inpatient Medications: Scheduled Meds:  allopurinol  300 mg Oral Daily   docusate sodium  100 mg Oral BID   feeding supplement  237 mL Oral TID BM   fluticasone  1 spray Each Nare BID   loratadine  10 mg Oral Daily   metoprolol succinate  25 mg Oral Daily   mometasone-formoterol  2 puff Inhalation BID   montelukast  10 mg Oral QHS   multivitamin with minerals  1 tablet Oral Daily   nystatin  5 mL Oral QID   olopatadine  1 drop Both Eyes BID   pantoprazole  40 mg Oral BID   Continuous  Infusions:  sodium chloride 75 mL/hr at 04/17/21 0521   cefTRIAXone (ROCEPHIN)  IV 2 g (04/17/21 1032)   metronidazole 500 mg (04/17/21 1345)   [START ON 04/18/2021] vancomycin     PRN Meds: acetaminophen **OR** acetaminophen, albuterol, bisacodyl, hydrALAZINE, HYDROcodone-acetaminophen, morphine injection, ondansetron **OR** ondansetron (ZOFRAN) IV, polyethylene glycol  Allergies:    Allergies  Allergen Reactions   Ciprofloxacin Rash and Other (See Comments)    SYNCOPE    Shellfish Allergy Anaphylaxis   Sulfa Antibiotics Other (See Comments)    SYNCOPE DIZZINESS     Social History:   Social History   Socioeconomic History   Marital status: Single    Spouse name: Not on file   Number of children: 1   Years of education:     Highest education level: Some college, no degree  Occupational History   Not on file  Tobacco Use   Smoking status: Never   Smokeless tobacco: Never  Vaping Use   Vaping Use: Never used  Substance and Sexual Activity   Alcohol use: Yes    Alcohol/week: 0.0 standard drinks    Comment: rarely   Drug use: No   Sexual activity: Not Currently  Other Topics Concern   Not on file  Social History Narrative   Not on file   Social Determinants of Health   Financial Resource Strain: High Risk   Difficulty of Paying Living Expenses: Hard  Food Insecurity: Food Insecurity Present   Worried About Running Out of Food in the Last Year: Sometimes true   Ran Out of Food in the Last Year: Never true  Transportation Needs: No Transportation Needs   Lack of Transportation (Medical): No   Lack of Transportation (Non-Medical): No  Physical Activity: Not on file  Stress: Not on file  Social Connections: Not on file  Intimate Partner Violence: Not on file    Family History:    Family History  Adopted: Yes  Problem Relation Age of Onset   Cancer Sister 97       unknown origin      ROS:  Please see the history of present illness.   All other ROS  reviewed and negative.     Physical Exam/Data:   Vitals:   04/17/21  [id number] 04/17/21 0841 04/17/21 1256 04/17/21 1348  BP: 110/79 111/86 (!) 88/69 95/73  Pulse: 93 99 92 92  Resp: '18 14 16   '$ Temp: (!) 97.5 F (36.4 C) 98 F (36.7 C) 98.2 F (36.8 C)   TempSrc: Oral Oral Oral   SpO2:   100%   Weight:      Height:        Intake/Output Summary (Last 24 hours) at 04/17/2021 1351 Last data filed at 04/17/2021 1300 Gross per 24 hour  Intake 1631.12 ml  Output 1020 ml  Net 611.12 ml   Last 3 Weights 04/16/2021 02/22/2021 02/14/2021  Weight (lbs) 200 lb 201 lb 205 lb  Weight (kg) 90.719 kg 91.173 kg 92.987 kg     Body mass index is 25.68 kg/m.  General:  Well nourished, well developed, in no acute distress Neck: no JVD Vascular: No carotid bruits; Cardiac:  normal S1, S2; RRR; no murmur  Lungs:  clear to auscultation bilaterally, no wheezing, rhonchi or rales  Abd: soft, nontender, no hepatomegaly  Skin: warm and dry  Neuro:  CNs 2-12 intact, no focal abnormalities noted Psych:  Normal affect   EKG:  The EKG was personally reviewed and demonstrates:  sinus tachycardia with HR 112, TWI lateral leads, LVH Telemetry:  Telemetry was personally reviewed and demonstrates:  N/Whitaker  Relevant CV Studies:  Right and left heart cath 12/12/20 Mr. Sabharwal has Whitaker nonischemic cardiomyopathy.  He has clean coronary arteries.  He has moderately elevated wedge and filling pressures.  Medical therapy will be recommended with guideline directed optimal medical therapy. Dr. Julieanne Manson , the patient's primary cardiologist, was notified of these results.  The radial sheath was removed and Whitaker TR band was placed on the right wrist to achieve patent hemostasis.  The patient left lab in stable condition.   Echo 12/08/20:  1. Left ventricular ejection fraction, by estimation, is 20 to 25%. The  left ventricle has severely decreased function. The left ventricle  demonstrates global hypokinesis. The left  ventricular internal cavity size  was mildly dilated. Left ventricular  diastolic parameters are consistent with Grade II diastolic dysfunction  (pseudonormalization).   2. Right ventricular systolic function is mildly reduced. The right  ventricular size is moderately enlarged. There is moderately elevated  pulmonary artery systolic pressure. The estimated right ventricular  systolic pressure is [id number] mmHg.   3. Left atrial size was severely dilated.   4. Right atrial size was severely dilated.   5. Whitaker small pericardial effusion is present. The pericardial effusion is  posterior to the left ventricle.   6. The mitral valve is normal in structure. Mild to moderate mitral valve  regurgitation.   7. Tricuspid valve regurgitation is moderate.   8. The aortic valve is tricuspid. There is mild thickening of the aortic  valve. Aortic valve regurgitation is trivial. Mild aortic valve sclerosis  is present, with no evidence of aortic valve stenosis.   9. Aortic dilatation noted. There is mild dilatation of the ascending  aorta, measuring 37 mm.  10. The inferior vena cava is dilated in size with <50% respiratory  variability, suggesting right atrial pressure of 15 mmHg.    Heart monitor 10/2020: Short runs of SVT, possibly Atrial Tachycardia Plan: Start metoprolol 12.5 mg PO BID   Laboratory Data:  High Sensitivity Troponin:  No results for input(s): TROPONINIHS in the last 720 hours.   Chemistry Recent Labs  Lab 04/15/21 1714 04/16/21 1335 04/17/21 0414  NA 134*  --  136  K 4.8  --  3.8  CL 98  --  101  CO2 26  --  26  GLUCOSE 93  --  79  BUN 49*  --  45*  CREATININE 2.18* 1.94* 1.82*  CALCIUM 8.7*  --  8.3*  GFRNONAA 33* 37* 40*  ANIONGAP 10  --  9    No results for input(s): PROT, ALBUMIN, AST, ALT, ALKPHOS, BILITOT in the last 168 hours. Hematology Recent Labs  Lab 04/15/21 1714 04/17/21 0414  WBC 8.9 4.7  RBC 5.30 4.75  HGB 14.3 12.8*  HCT 44.9 39.7  MCV 84.7  83.6  MCH 27.0 26.9  MCHC 31.8 32.2  RDW 17.2* 16.7*  PLT 422* 341   BNP Recent Labs  Lab 04/15/21 1714  BNP >4,500.0*    DDimer No results for input(s): DDIMER in the last 168 hours.   Radiology/Studies:  DG Chest 2 View  Result Date: 04/15/2021 CLINICAL DATA:  Shortness of breath and edema. EXAM: CHEST - 2 VIEW COMPARISON:  October 07, 2020 FINDINGS: The heart size and mediastinal contours are within normal limits. Both lungs are clear. The visualized skeletal structures are unremarkable. IMPRESSION: No active cardiopulmonary disease. Electronically Signed   By: Virgina Norfolk M.D.   On: 04/15/2021 17:41   DG Foot 2 Views Left  Result Date: 04/16/2021 CLINICAL DATA:  Recent amputation.  Unable to get off of toilet. EXAM: LEFT FOOT - 2 VIEW COMPARISON:  04/06/2021 FINDINGS: Marked diffuse soft tissue swelling. Status post first and third amputation at the level of the MCP joints. Status post amputation of the second ray at the level of the head the metacarpal bone. Chronic neuropathic changes involving the hindfoot with collapse of the talar dome suspected There is Whitaker gas containing wound along the medial aspect of the midfoot overlying the navicular bone with signs of cortical destruction and erosion of the medial cortex of the navicular bone which appear progressive compared with 04/06/2021. IMPRESSION: 1. Findings compatible with progressive osteomyelitis of the medial aspect of the navicular bone with overlying soft tissue ulceration. 2. Marked diffuse soft tissue swelling. 3. Chronic advanced arthropathic changes of the hindfoot, likely neuropathic. Electronically Signed   By: Kerby Moors M.D.   On: 04/16/2021 05:01     Assessment and Plan:   Chronic systolic and diastolic heart failure Nonischemic cardiomyopathy MR, TR - LVEF 20-25%. Grade 2 DD - GDMT had been titrated to include BB, farxiga, low dose entresto, and torsemide - he stopped taking medications on Friday -  appears very comfortable in bed - would restart IV diuretic tomorrow in anticipation of surgery - will work on volume status and resumption of CHF medications after surgery   AKI Suspect Whitaker component of CKD - sCr 1.82 (1.94), K 3.8 - trending down - baseline creatinine fluctuates between 1.2-1.3 - holding torsemide and entresto - daily BMP   SVT Atrial tachycardia - consider telemetry  - continue low does beta blocker   Osteomyelitis - planning for L BKA on Wed   Preoperative evaluation for cardiac risk CHF and DM not requiring insulin. According to the RCRI, he has 6.6% risk of Whitaker MACE. Will optimize his volume status being mindful of his renal function. He is at least moderate risk for surgery. Heart cath earlier this year very encouraging.     Risk Assessment/Risk Scores:       For questions or updates, please contact [place] Please consult [email or web address] for contact info under  Signed, Ledora Bottcher, Whitaker  04/17/2021 1:51 PM   Pt seen and examined   I agree with findings as recorded by Whitaker Duke above Pt is Whitaker 33 with NICM (Normal coronary arteries4/22)  LVEF 20 to 25%, Hx SVT/Atiral tach(on B Blocker)   Followed by [facility], advancing to GDMT for CHF Pt also with hx of osteomyelits   Now admitted for fatigue, inability to get around home   Found to have oozing wound from foot  Asked to see prior to surgery (amputation)  Pt currently without CP  Breathing is OK He is in NAD Neck:  JVP is normal Lungs are CTA  Cardiac RRR  No S3   No signif murmur Abd   Increased BS   Supple  nontender Ext with tr edema on R  L wrapped    Impression:   NICM  Volume status is not bad   Remains on Toprol  Entresto on hold for now as BP low      Hx SVT/atrial tach  Keep on tele    Preop risk assessment    Pt is at moderate but acceptable risk for major cardiac complication with surgery, mostly from decompensation of CHF    OK to proceed   Watch fluids and BP  closely   Keep on telemetry Will continue to follow   Hemodynamics may improve after this Rx for infection.  Will continue to follow  Dorris Carnes Whitaker

## 2021-04-17 NOTE — Progress Notes (Signed)
Patient has had three loose bowel movements since this morning.

## 2021-04-17 NOTE — Progress Notes (Signed)
Patient laying in bed alert attentive.  Says he did not take his cardiac medication because he had an upset stomach which he thinks caused his heart issues.  Patient will be scheduled for transtibial amputation on Wednesday.

## 2021-04-17 NOTE — Care Management (Signed)
Patient  scheduled for transtibial amputation on Wednesday, will await post op PT/OT notes.

## 2021-04-17 NOTE — Progress Notes (Signed)
Pharmacy Antibiotic Note  Stephen Whitaker is a 65 y.o. male admitted on 04/15/2021 with progressive LLE osteomyelitis.  Pharmacy has been consulted for Vancomycin dosing. Patient is also on ceftriaxone and metronidazole per team.   Patient with AKI improving. Planned amputation 8/31.  8/29 Vancomycin '1250mg'$  Q 24 hr Scr used: 1.82 mg/dL Weight: 97=0.7 kg Vd coeff: 0.72 L/kg Est AUC: 446   Plan: Increase vancomycin to '1250mg'$  Q24hr Ceftriaxone and metronidazole per team  Monitor cultures, clinical status, renal fx, vanc level Narrow abx as able and f/u duration    Height: '6\' 2"'$  (188 cm) Weight: 90.7 kg (200 lb) IBW/kg (Calculated) : 82.2  Temp (24hrs), Avg:97.7 F (36.5 C), Min:97.5 F (36.4 C), Max:98.2 F (36.8 C)  Recent Labs  Lab 04/15/21 1714 04/16/21 0552 04/16/21 1027 04/16/21 1335 04/17/21 0414  WBC 8.9  --   --   --  4.7  CREATININE 2.18*  --   --  1.94* 1.82*  LATICACIDVEN  --  2.0* 1.4  --   --     Estimated Creatinine Clearance: 46.4 mL/min (A) (by C-G formula based on SCr of 1.82 mg/dL (H)).    Allergies  Allergen Reactions   Ciprofloxacin Rash and Other (See Comments)    SYNCOPE    Shellfish Allergy Anaphylaxis   Sulfa Antibiotics Other (See Comments)    SYNCOPE DIZZINESS     Antimicrobials this admission: Vancomycin 8/28 >> Metronidazole 8/28 >>9/3 Ceftriaxone 8/28 >>9/3 Nystatin for thrush 8/28>>   PTA Doxy   Dose adjustments this admission: 8/29 vanc '1000mg'$  q24hr > '1250mg'$  q24hr  Microbiology results: 8/28 BCx: ngtd  Thank you for allowing pharmacy to be a part of this patient's care.  Benetta Spar, PharmD, BCPS, BCCP Clinical Pharmacist  Please check AMION for all [place] phone numbers After 10:00 PM, call [place] [phone]

## 2021-04-17 NOTE — Progress Notes (Signed)
Heart Failure Nurse Navigator Progress Note  PCP: [name], NP PCP-Cardiologist: Osborne Oman., MD Admission Diagnosis: DM foot infection, CHF Admitted from: home  Presentation:   Stephen Whitaker presented 8/27 with non healing painful wound of L foot. Current plan for L BKA Wednesday. Pt in bed eating breakfast with son at bedside during interview. Pt states his address is correct, rents a room and bathroom at house with landlord. 3 steps to get into home ,no handrails. Pt still working as a Freight forwarder. Son states concern for hoarding, [name] showed this Probation officer video of room. Items stacks up waist to midchest height with small pathway from bed to doors. Pt states he is ok with son cleaning and throwing away items including food. Pt states he is caught up on bills, then later stated he owes around $350 for the month of August. States his landlord's biggest complaint is the abundance of food in the shared fridge in which much is molded. Pt states he buys groceries but also gets 1-2 meals from the churches usually daily. Pt phone does not work consistently--states it is a Sports coach and bills are caught up, phone did have a broken screen. Pt has Pharmacist, community and has never completed applying for medicare or disability.  Pt states he has picked up all his heart failure medications and only has trouble affording his EipPen and a medication for acute gout flares. States he doesn't take his lasix d/t frequent urination while at work. Pt endorsed not taking other medication as prescribed d/t nausea and foot pain. Educated on importance of following medication regimen as prescribed. Navigator consulted CSW to assist.   ECHO/ LVEF: 20-25%  Clinical Course:  Past Medical History:  Diagnosis Date   Abnormal liver function tests    Arthritis    Boil of buttock    COPD (chronic obstructive pulmonary disease) (HCC)    Chronic Bronchitis.Advair inhaler    Dyspnea    GERD  (gastroesophageal reflux disease)    takes Protonix daily   Gout    takes Allopurinol daily   Hemorrhoids    Hypertension    takes HCTZ daily   Hypomagnesemia    Leg ulcer (HCC)    Mild mitral regurgitation    Pancreatitis    10 years ago   Pneumonia 2016   Tricuspid regurgitation    Tubular adenoma of colon      Social History   Socioeconomic History   Marital status: Single    Spouse name: Not on file   Number of children: 1   Years of education:     Highest education level: Some college, no degree  Occupational History   Not on file  Tobacco Use   Smoking status: Never   Smokeless tobacco: Never  Vaping Use   Vaping Use: Never used  Substance and Sexual Activity   Alcohol use: Yes    Alcohol/week: 0.0 standard drinks    Comment: rarely   Drug use: No   Sexual activity: Not Currently  Other Topics Concern   Not on file  Social History Narrative   Not on file   Social Determinants of Health   Financial Resource Strain: High Risk   Difficulty of Paying Living Expenses: Hard  Food Insecurity: Food Insecurity Present   Worried About Running Out of Food in the Last Year: Sometimes true   Ran Out of Food in the Last Year: Never true  Transportation Needs: No Transportation Needs   Lack of Transportation (  Medical): No   Lack of Transportation (Non-Medical): No  Physical Activity: Not on file  Stress: Not on file  Social Connections: Not on file    High Risk Criteria for Readmission and/or Poor Patient Outcomes: Heart failure hospital admissions (last 6 months): 2  No Show rate: 19% Difficult social situation: yes Demonstrates medication adherence: no Primary Language: English Literacy level: able to read/write. Concern for safe adequate self care.  Education Assessment and Provision:  Detailed education and instructions provided on heart failure disease management including the following:  Signs and symptoms of Heart Failure When to call the  physician Importance of daily weights Low sodium diet Fluid restriction Medication management Anticipated future follow-up appointments  Patient education given on each of the above topics.  Patient acknowledges understanding via teach back method and acceptance of all instructions.  Education Materials:  "Living Better With Heart Failure" Booklet, HF zone tool, & Daily Weight Tracker Tool.  Patient has scale at home: no, will give at [place] appt Patient has pill box at home: yes   Barriers of Care:   -knowledge deficit: gen HF education/diet -stable housing -phone broken -hoarding issues -pending eviction 11/1 -? Disability & medicare -medication noncompliance  Considerations/Referrals:   Referral made to Heart Failure Pharmacist Stewardship: yes, appreciated Referral made to Heart Failure CSW/NCM TOC: yes, appreciated Referral made to Heart & Vascular TOC clinic: yes, placing closer to DC date  Items for Follow-up on DC/TOC: -stable housing/evictions issues -need cell phone -dietary/fluid modification -medication compliance -optimize -medication costs   Pricilla Holm, MSN, RN Heart Failure Nurse Navigator [phone]

## 2021-04-17 NOTE — Progress Notes (Signed)
Initial Nutrition Assessment  DOCUMENTATION CODES:   Severe malnutrition in context of acute illness/injury  INTERVENTION:   -Ensure Enlive po TID, each supplement provides 350 kcal and 20 grams of protein  -Magic cup TID with meals, each supplement provides 290 kcal and 9 grams of protein  -MVI with minerals daily  NUTRITION DIAGNOSIS:   Severe Malnutrition related to acute illness (lt foot osteomyelitis) as evidenced by moderate fat depletion, moderate muscle depletion, edema.  GOAL:   Patient will meet greater than or equal to 90% of their needs  MONITOR:   PO intake, Supplement acceptance, Diet advancement, Labs, Weight trends, Skin, I & O's  REASON FOR ASSESSMENT:   Consult Wound healing  ASSESSMENT:   Stephen Whitaker is a 65 y.o. male with medical history significant of COPD; chronic systolic CHF (EF 0000000); DM; and HTN presenting with foot infection.  He has been followed by Dr. Sharol Given for his chronic L Charcot foot ulceration and has been on doxy with wound care.  He was last seen on 8/25 and was planning for BKA this coming Wednesday (8/31).   The patient worked yesterday and drove home but was too tired to get out of the car and fell asleep in the driveway.  Neighbors helped him inside and then he was too weak to get off the commode and so he came to the ER.  He denied any additional physical concerns at the time of our discussion.  He did report unstable living situation (landlord is evicting him), need for assistance in obtaining short and long-term disability through his work, and probable need for placement after surgery since he does not have other available housing options.  Pt osteomyelitis of lt foot.   Reviewed I/O's: +1.4 L x 24 hours  UOP: 500 ml x 24 hours  Per orthopedics notes, plan for lt BKA on Wednesday, 04/19/21.  Spoke with pt and son at bedside. Pt reports that he has a very socially complex living situation- he is about to get evicted from his  residence. Pt shares that there is a lot of friction with his landlord, which has increased his stress. Pt also shares that he has worsening pain in his left foot, which he attributes to the debridement he had about two weeks ago. His mobility and productivity has been impacted, as it takes him much longer to dress, get out of bed, and perform his work tasks due to weakness and pain.   Pt reports that he has had to supplement his intake with food from food pantries, but has also been eating less secondary to pain. Observed breakfast tray- pt consumed about 50% of breakfast. Also provided pt with ice chips and pudding per his request.   Pt reports UBW is around 200#. He endorses wt loss secondary to poor oral intake and diuretics. He admits to medication compliance with lasix, secondary to his work schedule. Reviewed wt hx; pt has experienced a 4.7% wt loss over the past 3 months, which is not significant for time frame. Suspect edema may be masking further weight loss as well as fat and muscle depletions  Discussed with pt importance of good meal and supplement intake to promote healing. Pt was occasionally drinking Muscle Milk PTA and is willing to try oral nutrition supplements, although he admits to lactose intolerance. RD provided pt with emotional support and comfort.   Suspect pt may have underlying chronic malnutrition, however, foot infection and social situation likely exacerbating malnutrition.   Medications reviewed  and colace and 0.9% sodium chloride infusion @ 75 ml/hr.    Lab Results  Component Value Date   HGBA1C 6.4 (H) 04/16/2021   PTA DM medications are 10 mg empaglofizin daily .   Labs reviewed.   NUTRITION - FOCUSED PHYSICAL EXAM:  Flowsheet Row Most Recent Value  Orbital Region Moderate depletion  Upper Arm Region Moderate depletion  Thoracic and Lumbar Region Moderate depletion  Buccal Region Moderate depletion  Temple Region Moderate depletion  Clavicle Bone Region  Moderate depletion  Clavicle and Acromion Bone Region Moderate depletion  Scapular Bone Region Moderate depletion  Dorsal Hand Mild depletion  Patellar Region No depletion  Anterior Thigh Region No depletion  Posterior Calf Region No depletion  Edema (RD Assessment) Moderate  Hair Reviewed  Eyes Reviewed  Mouth Reviewed  Skin Reviewed  Nails Reviewed       Diet Order:   Diet Order             Diet Carb Modified Fluid consistency: Thin; Room service appropriate? Yes  Diet effective now                   EDUCATION NEEDS:   Education needs have been addressed  Skin:  Skin Assessment: Skin Integrity Issues: Skin Integrity Issues:: Stage II, Diabetic Ulcer Stage II: buttocks Diabetic Ulcer: lt medial foot  Last BM:  04/14/21  Height:   Ht Readings from Last 1 Encounters:  04/16/21 '6\' 2"'$  (1.88 m)    Weight:   Wt Readings from Last 1 Encounters:  04/16/21 90.7 kg    Ideal Body Weight:  86.4 kg  BMI:  Body mass index is 25.68 kg/m.  Estimated Nutritional Needs:   Kcal:  2400-2600  Protein:  125-150 grams  Fluid:  > 2 L    Stephen Whitaker, RD, LDN, [place] Registered Dietitian II Certified Diabetes Care and Education Specialist Please refer to [facility] for RD and/or RD on-call/weekend/after hours pager

## 2021-04-18 DIAGNOSIS — M86672 Other chronic osteomyelitis, left ankle and foot: Secondary | ICD-10-CM | POA: Diagnosis not present

## 2021-04-18 DIAGNOSIS — I5020 Unspecified systolic (congestive) heart failure: Secondary | ICD-10-CM | POA: Diagnosis not present

## 2021-04-18 LAB — COMPREHENSIVE METABOLIC PANEL
ALT: 36 U/L (ref 0–44)
AST: 60 U/L — ABNORMAL HIGH (ref 15–41)
Albumin: 1.9 g/dL — ABNORMAL LOW (ref 3.5–5.0)
Alkaline Phosphatase: 84 U/L (ref 38–126)
Anion gap: 8 (ref 5–15)
BUN: 37 mg/dL — ABNORMAL HIGH (ref 8–23)
CO2: 25 mmol/L (ref 22–32)
Calcium: 8.3 mg/dL — ABNORMAL LOW (ref 8.9–10.3)
Chloride: 104 mmol/L (ref 98–111)
Creatinine, Ser: 1.61 mg/dL — ABNORMAL HIGH (ref 0.61–1.24)
GFR, Estimated: 47 mL/min — ABNORMAL LOW (ref >60)
Glucose, Bld: 91 mg/dL (ref 70–99)
Potassium: 3.6 mmol/L (ref 3.5–5.1)
Sodium: 137 mmol/L (ref 135–145)
Total Bilirubin: 1.2 mg/dL (ref 0.3–1.2)
Total Protein: 6.2 g/dL — ABNORMAL LOW (ref 6.5–8.1)

## 2021-04-18 LAB — TYPE AND SCREEN
ABO/RH(D): O POS
Antibody Screen: NEGATIVE

## 2021-04-18 MED ORDER — LACTASE 3000 UNITS PO TABS
6000.0000 [IU] | ORAL_TABLET | Freq: Three times a day (TID) | ORAL | Status: DC
  Filled 2021-04-18: qty 2

## 2021-04-18 MED ORDER — LACTASE 3000 UNITS PO TABS
6000.0000 [IU] | ORAL_TABLET | Freq: Three times a day (TID) | ORAL | Status: DC
  Administered 2021-04-18 – 2021-04-30 (×30): 6000 [IU] via ORAL
  Filled 2021-04-18 (×37): qty 2

## 2021-04-18 MED ORDER — FUROSEMIDE 10 MG/ML IJ SOLN
40.0000 mg | Freq: Once | INTRAMUSCULAR | Status: AC
  Administered 2021-04-18: 40 mg via INTRAVENOUS
  Filled 2021-04-18: qty 4

## 2021-04-18 MED ORDER — BOOST / RESOURCE BREEZE PO LIQD CUSTOM
237.0000 mL | Freq: Three times a day (TID) | ORAL | Status: DC
  Administered 2021-04-18 – 2021-04-19 (×3): 1 via ORAL

## 2021-04-18 MED ORDER — ENSURE PRE-SURGERY PO LIQD
296.0000 mL | Freq: Once | ORAL | Status: DC
  Filled 2021-04-18 (×2): qty 296

## 2021-04-18 MED ORDER — VANCOMYCIN HCL 1500 MG/300ML IV SOLN
1500.0000 mg | INTRAVENOUS | Status: AC
  Administered 2021-04-19 – 2021-04-22 (×4): 1500 mg via INTRAVENOUS
  Filled 2021-04-18 (×4): qty 300

## 2021-04-18 NOTE — Progress Notes (Addendum)
Progress Note  Patient Name: Stephen Whitaker Date of Encounter: 04/18/2021  Primary Cardiologist: [name], MD   Subjective   Doing ok, anticipate surgery tomorrow. No chest pain or SOB.   Inpatient Medications    Scheduled Meds:  allopurinol  300 mg Oral Daily   feeding supplement  237 mL Oral TID BM   fluticasone  1 spray Each Nare BID   loratadine  10 mg Oral Daily   metoprolol succinate  25 mg Oral Daily   mometasone-formoterol  2 puff Inhalation BID   montelukast  10 mg Oral QHS   multivitamin with minerals  1 tablet Oral Daily   nystatin  5 mL Oral QID   olopatadine  1 drop Both Eyes BID   pantoprazole  40 mg Oral BID   Continuous Infusions:  cefTRIAXone (ROCEPHIN)  IV 2 g (04/18/21 0900)   metronidazole 500 mg (04/18/21 0513)   [START ON 04/19/2021] vancomycin     PRN Meds: acetaminophen **OR** acetaminophen, albuterol, bisacodyl, hydrALAZINE, HYDROcodone-acetaminophen, morphine injection, ondansetron **OR** ondansetron (ZOFRAN) IV, polyethylene glycol   Vital Signs    Vitals:   04/17/21 2101 04/18/21 0315 04/18/21 0700 04/18/21 0832  BP: 111/88 98/77  (!) 113/95  Pulse: 100 90  (!) 105  Resp:  '16 16 16  '$ Temp: 99 F (37.2 C) 98.8 F (37.1 C)  98.2 F (36.8 C)  TempSrc: Oral Oral Axillary Oral  SpO2: 100% 98%    Weight:      Height:        Intake/Output Summary (Last 24 hours) at 04/18/2021 1040 Last data filed at 04/18/2021 0759 Gross per 24 hour  Intake 1815.83 ml  Output 800 ml  Net 1015.83 ml   Filed Weights   04/16/21 1958  Weight: 90.7 kg    Physical Exam   General: Well developed, well nourished, NAD Neck: Negative for carotid bruits. No JVD Lungs:Clear to ausculation bilaterally. Breathing is unlabored. Cardiovascular: RRR with S1 S2. No murmurs Extremities: 1-2+ BLE edema. Radial pulses 2+ bilaterally Neuro: Alert and oriented. No focal deficits. No facial asymmetry. MAE spontaneously. Psych: Responds to  questions appropriately with normal affect.    Labs    Chemistry Recent Labs  Lab 04/15/21 1714 04/16/21 1335 04/17/21 0414 04/18/21 0201  NA 134*  --  136 137  K 4.8  --  3.8 3.6  CL 98  --  101 104  CO2 26  --  26 25  GLUCOSE 93  --  79 91  BUN 49*  --  45* 37*  CREATININE 2.18* 1.94* 1.82* 1.61*  CALCIUM 8.7*  --  8.3* 8.3*  PROT  --   --   --  6.2*  ALBUMIN  --   --   --  1.9*  AST  --   --   --  60*  ALT  --   --   --  36  ALKPHOS  --   --   --  84  BILITOT  --   --   --  1.2  GFRNONAA 33* 37* 40* 47*  ANIONGAP 10  --  9 8     Hematology Recent Labs  Lab 04/15/21 1714 04/17/21 0414  WBC 8.9 4.7  RBC 5.30 4.75  HGB 14.3 12.8*  HCT 44.9 39.7  MCV 84.7 83.6  MCH 27.0 26.9  MCHC 31.8 32.2  RDW 17.2* 16.7*  PLT 422* 341    Cardiac EnzymesNo results for input(s): TROPONINI in  the last 168 hours. No results for input(s): TROPIPOC in the last 168 hours.   BNP Recent Labs  Lab 04/15/21 1714  BNP >4,500.0*     DDimer No results for input(s): DDIMER in the last 168 hours.   Radiology    No results found.  Telemetry    NSR with PVCs  - Personally Reviewed  ECG    No new tracing as of 04/18/21 - Personally Reviewed  Cardiac Studies   Right and left heart cath 12/12/20 Mr. Gerhart has a nonischemic cardiomyopathy.  He has clean coronary arteries.  He has moderately elevated wedge and filling pressures.  Medical therapy will be recommended with guideline directed optimal medical therapy. Dr. Julieanne Manson , the patient's primary cardiologist, was notified of these results.  The radial sheath was removed and a TR band was placed on the right wrist to achieve patent hemostasis.  The patient left lab in stable condition.   Echo 12/08/20:  1. Left ventricular ejection fraction, by estimation, is 20 to 25%. The  left ventricle has severely decreased function. The left ventricle  demonstrates global hypokinesis. The left ventricular internal cavity size  was  mildly dilated. Left ventricular  diastolic parameters are consistent with Grade II diastolic dysfunction  (pseudonormalization).   2. Right ventricular systolic function is mildly reduced. The right  ventricular size is moderately enlarged. There is moderately elevated  pulmonary artery systolic pressure. The estimated right ventricular  systolic pressure is [id number] mmHg.   3. Left atrial size was severely dilated.   4. Right atrial size was severely dilated.   5. A small pericardial effusion is present. The pericardial effusion is  posterior to the left ventricle.   6. The mitral valve is normal in structure. Mild to moderate mitral valve  regurgitation.   7. Tricuspid valve regurgitation is moderate.   8. The aortic valve is tricuspid. There is mild thickening of the aortic  valve. Aortic valve regurgitation is trivial. Mild aortic valve sclerosis  is present, with no evidence of aortic valve stenosis.   9. Aortic dilatation noted. There is mild dilatation of the ascending  aorta, measuring 37 mm.  10. The inferior vena cava is dilated in size with <50% respiratory  variability, suggesting right atrial pressure of 15 mmHg.    Heart monitor 10/2020: Short runs of SVT, possibly Atrial Tachycardia Plan: Start metoprolol 12.5 mg PO BID  Patient Profile     65 y.o. male with a hx of HFrEF, HTN, DM, COPD, GERD, moderate TR, PSVT, and gout who is being seen 04/17/2021 for the evaluation of preop clearance at the request of Dr. Algis Liming.  Assessment & Plan    1. Preoperative cardiac clearance: -Evaluated by our team yesterday for pre-operative clearance felt to be at moderate but acceptable risk for major cardiac complication with surgery>>mostly in the setting of decompensated CHF -Will need to optimize from a fluid volume status in the peri/post operative settings  -Plan for left BKA in the setting of osteomylitis on Wednesday  -Likely will add IV Lasix today and follow response closely    2. Chronic systolic and diastolic CHF/NICM: -LVEF 0000000 with G2DD per echo 11/2019 -GDMT with beta blocker, farxiga, Entresto, and torsemide -Consider IV Lasix today prior to surgery tomorrow given improvement in renal function -BNP on admission >4500  3. Acute on chronic kidney disease: -Creatinine, 1.94 on admission>>1.61 today  -Baseline appears to be in the 1.2-1.3 range  -Holding Entresto  -Plan to restart after surgery  4. SVT/atrial tachycardia: -Consider telemetry  -On low dose beta blocker   5. Osteomyelitis: -Plan for left BKA tomorrow -Abx per IM    Signed, Kathyrn Drown NP-C [facility] Pager: [phone] 04/18/2021, 10:40 AM     For questions or updates, please contact   Please consult [email or web address] for contact info under Cardiology/STEMI.  Personally seen and examined. Agree with APP above with the following comments and additions to above: Briefly 64 yo M with SVT and HFrEF and AKI on CKD (improved) Patient notes a bit of SOB but feels better. Exam notable for +1 pitting edema; slight dullness to percussion of abdomen, slightly hypervolemic. Labs notable for improvement in creatinine Personally reviewed relevant tests; Sinus tach to SR on telemetry Would recommend  - one dose of IV diuretic today (low dose; lasix 40 IV X1) - post surgery will hope to return to his GDMT - patient's son notes eviction notice with concerns of hoarding; loss of telephone service; son is hoping to clean housing while pt is inpatient to help support father; social work consultation pending for today - restarted patients home lactase per request.  Will follow post op for volume status and med restart  Rudean Haskell, MD Cardiologist [facility]  [address] [place], [place] [address] [phone]  12:04 PM

## 2021-04-18 NOTE — Progress Notes (Signed)
PROGRESS NOTE   Stephen Whitaker  [id number]    DOB: 07/26/1956    DOA: 04/15/2021  PCP: [name], NP   I have briefly reviewed patients previous medical records in [facility].  Chief Complaint  Patient presents with   Recurrent Skin Infections    Brief Narrative:  65 year old male with medical history significant for COPD, chronic systolic CHF (LVEF 0000000), DM 2 and HTN presented with left foot infection.  He has been followed by Dr. Sharol Given for his chronic left Charcot foot ulceration and has been on doxycycline with wound care.  He was last seen on 8/25 and was planning for BKA on 8/31 but on day of admission he was found to be too weak when presented to the ED.  Reported unstable living situation.  Admitted for chronic osteomyelitis and ulceration of left Charcot foot.  Orthopedics consulted, plan for surgery possibly 8/31.  Cardiology consulted for preop clearance and CHF management   Assessment & Plan:  Principal Problem:   Osteomyelitis of left foot (HCC) Active Problems:   COLD (chronic obstructive lung disease) (HCC)   Essential hypertension   HFrEF (heart failure with reduced ejection fraction) (HCC)   Fatigue   Pressure injury of skin   Protein-calorie malnutrition, severe   Chronic osteomyelitis and ulceration of left Charcot foot with prior amputations: Dr. Sharol Given consulted.  Continue IV antibiotics (Rocephin/Flagyl/vancomycin).  Cardiology consulted for preop clearance.  Plan for BKA on 8/31.  Foot ulcer draining on admission.  Per cardiology, moderate but acceptable risk for major cardiac complication with surgery, mostly in the setting of decompensated CHF-being managed by cardiology.  Acute on chronic combined systolic and diastolic CHF/nonischemic cardiomyopathy: LV EF 20-25% and grade 2 diastolic dysfunction.  Continue beta-blockers.  Holding [place], [place] and torsemide due to AKI.  Reportedly stopped taking his medications recently.  Cardiology  follow-up appreciated, adding IV Lasix today.  BNP >4500 on admission.  Acute kidney injury complicating stage IIIb CKD: Presented with creatinine of 2.18 which is improved to 1.82.  Baseline creatinine is fluctuating but may be between 1.2-1.3.  Holding torsemide and Entresto.  Follow daily BMP.  DC IV fluids due to CHF.  Creatinine down to 1.6.  Follow BMP closely.  SVT/atrial tachycardia: Continue beta-blockers.  COPD: Stable without clinical bronchospasm.  Continue Singulair and Dulera.  Essential hypertension: Controlled.  Continue Toprol-XL.  Holding Bruno.  Gout: No acute flare.  Continue allopurinol  Social issues Patient reportedly has lost his housing and also needs [facility] team assistance with disability paperwork.  May need SNF post amputation.  Body mass index is 25.68 kg/m.  Nutritional Status Nutrition Problem: Severe Malnutrition Etiology: acute illness (lt foot osteomyelitis) Signs/Symptoms: moderate fat depletion, moderate muscle depletion, edema Interventions: Ensure Enlive (each supplement provides 350kcal and 20 grams of protein), Magic cup, MVI  Pressure Ulcer:     DVT prophylaxis: Place and maintain sequential compression device Start: 04/16/21 1913     Code Status: Full Code Family Communication: Discussed in detail with patient's son at bedside 9/29.  None at bedside today Disposition:  Status is: Inpatient  Remains inpatient appropriate because:Inpatient level of care appropriate due to severity of illness  Dispo: The patient is from: Home              Anticipated d/c is to:  TBD              Patient currently is not medically stable to d/c.   Difficult to place patient  No        Consultants:   Orthopedics Cardiology.  Procedures:   None  Antimicrobials:    Anti-infectives (From admission, onward)    Start     Dose/Rate Route Frequency Ordered Stop   04/19/21 0400  vancomycin (VANCOREADY) IVPB 1500 mg/300 mL        1,500 mg 150  mL/hr over 120 Minutes Intravenous Every 24 hours 04/18/21 1020     04/18/21 0600  vancomycin (VANCOREADY) IVPB 1250 mg/250 mL  Status:  Discontinued        1,250 mg 166.7 mL/hr over 90 Minutes Intravenous Every 24 hours 04/17/21 1319 04/18/21 1020   04/17/21 0800  vancomycin (VANCOCIN) IVPB 1000 mg/200 mL premix  Status:  Discontinued        1,000 mg 200 mL/hr over 60 Minutes Intravenous Every 24 hours 04/16/21 0726 04/17/21 1319   04/16/21 1430  cefTRIAXone (ROCEPHIN) 2 g in sodium chloride 0.9 % 100 mL IVPB        2 g 200 mL/hr over 30 Minutes Intravenous Every 24 hours 04/16/21 0925 04/23/21 0959   04/16/21 1430  metroNIDAZOLE (FLAGYL) IVPB 500 mg        500 mg 100 mL/hr over 60 Minutes Intravenous Every 8 hours 04/16/21 0925 04/23/21 1429   04/16/21 0615  vancomycin (VANCOREADY) IVPB 1500 mg/300 mL        1,500 mg 150 mL/hr over 120 Minutes Intravenous  Once 04/16/21 0609 04/16/21 0910   04/16/21 0600  piperacillin-tazobactam (ZOSYN) IVPB 3.375 g        3.375 g 100 mL/hr over 30 Minutes Intravenous  Once 04/16/21 0555 04/16/21 0708         Subjective:  Poor historian.  Denies dyspnea.  Ongoing left foot pain  Objective:   Vitals:   04/18/21 0315 04/18/21 0700 04/18/21 0832 04/18/21 1551  BP: 98/77  (!) 113/95 95/79  Pulse: 90  (!) 105 96  Resp: '16 16 16 15  '$ Temp: 98.8 F (37.1 C)  98.2 F (36.8 C) 97.6 F (36.4 C)  TempSrc: Oral Axillary Oral Oral  SpO2: 98%   (!) 80%  Weight:      Height:        General exam: Middle-age male, looks older than stated age, chronically ill looking, lying comfortably propped up in bed without distress. Respiratory system: Clear to auscultation.  No increased work of breathing. Cardiovascular system: S1 and S2 heard, RRR.  No JVD or murmurs.  Trace bilateral ankle edema.  Telemetry personally reviewed: ST in the 100s. Gastrointestinal system: Abdomen is nondistended, soft and nontender. No organomegaly or masses felt. Normal bowel  sounds heard. Central nervous system: Alert and oriented. No focal neurological deficits. Extremities: Symmetric 5 x 5 power.  Left foot exam as per pictures below taken on day of admission 8/27.  Currently dressed and did not take down dressing to examine. Skin: No rashes, lesions or ulcers Psychiatry: Judgement and insight appear somewhat impaired.. Mood & affect appropriate.        Data Reviewed:   I have personally reviewed following labs and imaging studies   CBC: Recent Labs  Lab 04/15/21 1714 04/17/21 0414  WBC 8.9 4.7  NEUTROABS 7.7  --   HGB 14.3 12.8*  HCT 44.9 39.7  MCV 84.7 83.6  PLT 422* [id number]    Basic Metabolic Panel: Recent Labs  Lab 04/15/21 1714 04/16/21 0411 04/16/21 1335 04/17/21 0414 04/18/21 0201  NA 134*  --   --  136  137  K 4.8  --   --  3.8 3.6  CL 98  --   --  101 104  CO2 26  --   --  26 25  GLUCOSE 93  --   --  79 91  BUN 49*  --   --  45* 37*  CREATININE 2.18*  --  1.94* 1.82* 1.61*  CALCIUM 8.7*  --   --  8.3* 8.3*  MG  --  1.8  --   --   --     Liver Function Tests: Recent Labs  Lab 04/18/21 0201  AST 60*  ALT 36  ALKPHOS 84  BILITOT 1.2  PROT 6.2*  ALBUMIN 1.9*    CBG: No results for input(s): GLUCAP in the last 168 hours.  Microbiology Studies:   Recent Results (from the past 240 hour(s))  Culture, blood (single) w Reflex to ID Panel     Status: None (Preliminary result)   Collection Time: 04/16/21  5:52 AM   Specimen: BLOOD  Result Value Ref Range Status   Specimen Description BLOOD SITE NOT SPECIFIED  Final   Special Requests   Final    BOTTLES DRAWN AEROBIC AND ANAEROBIC Blood Culture results may not be optimal due to an inadequate volume of blood received in culture bottles   Culture   Final    NO GROWTH 2 DAYS Performed at [facility], [place] [address], [place], [place] [address]    Report Status PENDING  Incomplete  Resp Panel by RT-PCR (Flu A&B, Covid) Nasopharyngeal Swab     Status: None    Collection Time: 04/16/21  7:11 AM   Specimen: Nasopharyngeal Swab; Nasopharyngeal(NP) swabs in vial transport medium  Result Value Ref Range Status   SARS Coronavirus 2 by RT PCR NEGATIVE NEGATIVE Final    Comment: (NOTE) SARS-CoV-2 target nucleic acids are NOT DETECTED.  The SARS-CoV-2 RNA is generally detectable in upper respiratory specimens during the acute phase of infection. The lowest concentration of SARS-CoV-2 viral copies this assay can detect is 138 copies/mL. A negative result does not preclude SARS-Cov-2 infection and should not be used as the sole basis for treatment or other patient management decisions. A negative result may occur with  improper specimen collection/handling, submission of specimen other than nasopharyngeal swab, presence of viral mutation(s) within the areas targeted by this assay, and inadequate number of viral copies(<138 copies/mL). A negative result must be combined with clinical observations, patient history, and epidemiological information. The expected result is Negative.  Fact Sheet for Patients:  EntrepreneurPulse.com.au  Fact Sheet for Healthcare Providers:  IncredibleEmployment.be  This test is no t yet approved or cleared by the [place] FDA and  has been authorized for detection and/or diagnosis of SARS-CoV-2 by FDA under an Emergency Use Authorization (EUA). This EUA will remain  in effect (meaning this test can be used) for the duration of the COVID-19 declaration under Section 564(b)(1) of the Act, 21 U.S.C.section 360bbb-3(b)(1), unless the authorization is terminated  or revoked sooner.       Influenza A by PCR NEGATIVE NEGATIVE Final   Influenza B by PCR NEGATIVE NEGATIVE Final    Comment: (NOTE) The Xpert Xpress SARS-CoV-2/FLU/RSV plus assay is intended as an aid in the diagnosis of influenza from Nasopharyngeal swab specimens and should not be used as a sole basis for treatment.  Nasal washings and aspirates are unacceptable for Xpert Xpress SARS-CoV-2/FLU/RSV testing.  Fact Sheet for Patients: EntrepreneurPulse.com.au  Fact Sheet for  Healthcare Providers: IncredibleEmployment.be  This test is not yet approved or cleared by the [place] and has been authorized for detection and/or diagnosis of SARS-CoV-2 by FDA under an Emergency Use Authorization (EUA). This EUA will remain in effect (meaning this test can be used) for the duration of the COVID-19 declaration under Section 564(b)(1) of the Act, 21 U.S.C. section 360bbb-3(b)(1), unless the authorization is terminated or revoked.  Performed at [facility], [place] [address] [place], [place] [address]   Blood Cultures x 2 sites     Status: None (Preliminary result)   Collection Time: 04/16/21 10:38 AM   Specimen: BLOOD  Result Value Ref Range Status   Specimen Description BLOOD SITE NOT SPECIFIED  Final   Special Requests   Final    BOTTLES DRAWN AEROBIC AND ANAEROBIC Blood Culture adequate volume   Culture   Final    NO GROWTH 2 DAYS Performed at [facility], [address], [place], [place] [address]    Report Status PENDING  Incomplete  Blood Cultures x 2 sites     Status: None (Preliminary result)   Collection Time: 04/16/21 10:46 AM   Specimen: BLOOD  Result Value Ref Range Status   Specimen Description BLOOD SITE NOT SPECIFIED  Final   Special Requests   Final    BOTTLES DRAWN AEROBIC AND ANAEROBIC Blood Culture adequate volume   Culture   Final    NO GROWTH 2 DAYS Performed at [facility], [address] [place], [place] [address]    Report Status PENDING  Incomplete     Radiology Studies:  No results found.   Scheduled Meds:    allopurinol  300 mg Oral Daily   feeding supplement  237 mL Oral TID BM   fluticasone  1 spray Each Nare BID   lactase  6,000 Units Oral TID WC   loratadine  10 mg Oral Daily   metoprolol  succinate  25 mg Oral Daily   mometasone-formoterol  2 puff Inhalation BID   montelukast  10 mg Oral QHS   multivitamin with minerals  1 tablet Oral Daily   nystatin  5 mL Oral QID   olopatadine  1 drop Both Eyes BID   pantoprazole  40 mg Oral BID    Continuous Infusions:    cefTRIAXone (ROCEPHIN)  IV Stopped (04/18/21 0930)   metronidazole Stopped (04/18/21 1428)   [START ON 04/19/2021] vancomycin       LOS: 2 days     Vernell Leep, MD, [place], [facility]    To contact the attending provider between 7A-7P or the covering provider during after hours 7P-7A, please log into the web site [email or web address] and access using universal [place] password for that web site. If you do not have the password, please call the hospital operator.  04/18/2021, 5:38 PM

## 2021-04-18 NOTE — Plan of Care (Signed)

## 2021-04-18 NOTE — Progress Notes (Signed)
Pharmacy Antibiotic Note  Stephen Whitaker is a 65 y.o. male admitted on 04/15/2021 with progressive LLE osteomyelitis.  Pharmacy has been consulted for vancomycin dosing. Patient is also on ceftriaxone and metronidazole per team.   Renal function continues to improve, afebrile, WBC WNL.    Plan: Increase vanc to '1500mg'$  IV Q24H for AUC 479 using SCr 1.6 CTX 2g IV Q24H Flagyl 500 IV Q8H Monitor renal fxn, clinical progress, vanc levels as indicated Planned amputation 8/31   Height: '6\' 2"'$  (188 cm) Weight: 90.7 kg (200 lb) IBW/kg (Calculated) : 82.2  Temp (24hrs), Avg:98.4 F (36.9 C), Min:98 F (36.7 C), Max:99 F (37.2 C)  Recent Labs  Lab 04/15/21 1714 04/16/21 0552 04/16/21 1027 04/16/21 1335 04/17/21 0414 04/18/21 0201  WBC 8.9  --   --   --  4.7  --   CREATININE 2.18*  --   --  1.94* 1.82* 1.61*  LATICACIDVEN  --  2.0* 1.4  --   --   --      Estimated Creatinine Clearance: 52.5 mL/min (A) (by C-G formula based on SCr of 1.61 mg/dL (H)).    Allergies  Allergen Reactions   Ciprofloxacin Rash and Other (See Comments)    SYNCOPE    Shellfish Allergy Anaphylaxis   Sulfa Antibiotics Other (See Comments)    SYNCOPE DIZZINESS    Vanc 8/28 >> Flagyl 8/28 >> (9/3) CTX 8/28 >> (9/3) Doxy PTA Nystatin for thrush 8/28>>     8/28 BCx - ngtd  [name] D. Mina Marble, PharmD, BCPS, [place] 04/18/2021, 10:19 AM

## 2021-04-18 NOTE — Progress Notes (Addendum)
Heart Failure Navigation Team Progress Note  PCP: [name], NP Primary Cardiologist: Gasper Sells, MD Admitted from: home with landlord eviction pending  Past Medical History:  Diagnosis Date   Abnormal liver function tests    Arthritis    Boil of buttock    COPD (chronic obstructive pulmonary disease) ([place])    Chronic Bronchitis.Advair inhaler    Dyspnea    GERD (gastroesophageal reflux disease)    takes Protonix daily   Gout    takes Allopurinol daily   Hemorrhoids    Hypertension    takes HCTZ daily   Hypomagnesemia    Leg ulcer (HCC)    Mild mitral regurgitation    Pancreatitis    10 years ago   Pneumonia 2016   Tricuspid regurgitation    Tubular adenoma of colon     Social History   Socioeconomic History   Marital status: Single    Spouse name: Not on file   Number of children: 1   Years of education:     Highest education level: Some college, no degree  Occupational History   Not on file  Tobacco Use   Smoking status: Never   Smokeless tobacco: Never  Vaping Use   Vaping Use: Never used  Substance and Sexual Activity   Alcohol use: Yes    Alcohol/week: 0.0 standard drinks    Comment: rarely   Drug use: No   Sexual activity: Not Currently  Other Topics Concern   Not on file  Social History Narrative   Not on file   Social Determinants of Health   Financial Resource Strain: High Risk   Difficulty of Paying Living Expenses: Hard  Food Insecurity: Food Insecurity Present   Worried About Running Out of Food in the Last Year: Sometimes true   Ran Out of Food in the Last Year: Never true  Transportation Needs: No Transportation Needs   Lack of Transportation (Medical): No   Lack of Transportation (Non-Medical): No  Physical Activity: Not on file  Stress: Not on file  Social Connections: Not on file     Heart & Vascular Transition of Care Clinic follow-up: Scheduled [place] appointment 05/02/21.  Confirmed patient will need transportation  arranged by HF RN navigator.  Immediate social needs: Eviction pending, needs a cell phone, transportation after amputation, needs wheelchair accessible housing  CSW spoke with Mr. Bolon at bedside and obtained his signature for the disability application, Food Stamps and rider waiver form for [facility] transportation.  04/27/2021 - Disability and Food Stamp application sent to the [facility]. [facility] transportation enrollment sent. CSW unable to provide the patient with a cell phone at this time as awaiting order to arrive. Mr. Utsler reported a preference for a smartphone and not a flip phone. CSW informed Mr. Zoto about applying for an assurance wireless phone/government phone and if his son could help him with the online application for that process. CSW encouraged Mr. Reza to follow up at the [facility] appt. Scheduled on 05/02/21 at [place], MSW, [place] Heart Failure Social Worker

## 2021-04-19 ENCOUNTER — Other Ambulatory Visit: Payer: Self-pay | Admitting: Physician Assistant

## 2021-04-19 ENCOUNTER — Encounter (HOSPITAL_COMMUNITY)
Admission: EM | Disposition: A | Discharge status: Skilled Nursing Facility | Payer: Self-pay | Source: Home / Self Care | Attending: Internal Medicine

## 2021-04-19 ENCOUNTER — Other Ambulatory Visit: Payer: Self-pay | Admitting: Internal Medicine

## 2021-04-19 DIAGNOSIS — I5020 Unspecified systolic (congestive) heart failure: Secondary | ICD-10-CM | POA: Diagnosis not present

## 2021-04-19 DIAGNOSIS — E11628 Type 2 diabetes mellitus with other skin complications: Secondary | ICD-10-CM | POA: Diagnosis not present

## 2021-04-19 DIAGNOSIS — L089 Local infection of the skin and subcutaneous tissue, unspecified: Secondary | ICD-10-CM | POA: Diagnosis not present

## 2021-04-19 DIAGNOSIS — J42 Unspecified chronic bronchitis: Secondary | ICD-10-CM

## 2021-04-19 LAB — BASIC METABOLIC PANEL
Anion gap: 8 (ref 5–15)
BUN: 33 mg/dL — ABNORMAL HIGH (ref 8–23)
CO2: 26 mmol/L (ref 22–32)
Calcium: 8.3 mg/dL — ABNORMAL LOW (ref 8.9–10.3)
Chloride: 104 mmol/L (ref 98–111)
Creatinine, Ser: 1.68 mg/dL — ABNORMAL HIGH (ref 0.61–1.24)
GFR, Estimated: 45 mL/min — ABNORMAL LOW (ref >60)
Glucose, Bld: 103 mg/dL — ABNORMAL HIGH (ref 70–99)
Potassium: 3.7 mmol/L (ref 3.5–5.1)
Sodium: 138 mmol/L (ref 135–145)

## 2021-04-19 LAB — SURGICAL PCR SCREEN
MRSA, PCR: NEGATIVE
Staphylococcus aureus: NEGATIVE

## 2021-04-19 SURGERY — AMPUTATION BELOW KNEE
Anesthesia: Choice | Site: Knee | Laterality: Left

## 2021-04-19 MED ORDER — TORSEMIDE 20 MG PO TABS
40.0000 mg | ORAL_TABLET | Freq: Every day | ORAL | Status: DC
  Administered 2021-04-19 – 2021-04-20 (×2): 40 mg via ORAL
  Filled 2021-04-19 (×3): qty 2

## 2021-04-19 MED ORDER — MUPIROCIN 2 % EX OINT
1.0000 "application " | TOPICAL_OINTMENT | Freq: Two times a day (BID) | CUTANEOUS | Status: AC
  Administered 2021-04-19 – 2021-04-23 (×9): 1 via NASAL
  Filled 2021-04-19 (×4): qty 22

## 2021-04-19 MED ORDER — PROSOURCE PLUS PO LIQD
30.0000 mL | Freq: Three times a day (TID) | ORAL | Status: DC
  Administered 2021-04-19 – 2021-05-02 (×24): 30 mL via ORAL
  Filled 2021-04-19 (×34): qty 30

## 2021-04-19 NOTE — Plan of Care (Signed)
Pt alert and oriented x 4. Pt denies pain. Pt is refusing to take ensure pre surgery drink. Pt is refusing to sign consent until he speaks with surgeon attempted to [facility] surgeon but will not allow at this time time. Will pass on to the dayshift staff. Awaiting MRSA swab results, Checklist started.   Problem: Education: Goal: Knowledge of General Education information will improve Description: Including pain rating scale, medication(s)/side effects and non-pharmacologic comfort measures Outcome: Not Progressing   Problem: Nutrition: Goal: Adequate nutrition will be maintained Outcome: Not Progressing   Problem: Elimination: Goal: Will not experience complications related to bowel motility Outcome: Not Progressing   Problem: Health Behavior/Discharge Planning: Goal: Ability to manage health-related needs will improve Outcome: Progressing   Problem: Clinical Measurements: Goal: Ability to maintain clinical measurements within normal limits will improve Outcome: Progressing Goal: Will remain free from infection Outcome: Progressing Goal: Diagnostic test results will improve Outcome: Progressing Goal: Respiratory complications will improve Outcome: Progressing Goal: Cardiovascular complication will be avoided Outcome: Progressing   Problem: Activity: Goal: Risk for activity intolerance will decrease Outcome: Progressing   Problem: Coping: Goal: Level of anxiety will decrease Outcome: Progressing   Problem: Elimination: Goal: Will not experience complications related to urinary retention Outcome: Progressing   Problem: Pain Managment: Goal: General experience of comfort will improve Outcome: Progressing   Problem: Safety: Goal: Ability to remain free from injury will improve Outcome: Progressing   Problem: Skin Integrity: Goal: Risk for impaired skin integrity will decrease Outcome: Progressing

## 2021-04-19 NOTE — Progress Notes (Signed)
Nutrition Follow-up  DOCUMENTATION CODES:   Severe malnutrition in context of acute illness/injury  INTERVENTION:   -D/c Boost Breeze -Continue Magic cup TID with meals, each supplement provides 290 kcal and 9 grams of protein  -6000 mg lactase TID -Hormel Shake TID, each supplement provides 520 kcals and 15 grams protein -Continue MVI with minerals daily  NUTRITION DIAGNOSIS:   Severe Malnutrition related to acute illness (lt foot osteomyelitis) as evidenced by moderate fat depletion, moderate muscle depletion, edema.  Ongoing  GOAL:   Patient will meet greater than or equal to 90% of their needs  Progressing   MONITOR:   PO intake, Supplement acceptance, Diet advancement, Labs, Weight trends, Skin, I & O's  REASON FOR ASSESSMENT:   Consult Wound healing  ASSESSMENT:   Stephen Whitaker is a 65 y.o. male with medical history significant of COPD; chronic systolic CHF (EF 0000000); DM; and HTN presenting with foot infection.  He has been followed by Dr. Sharol Given for his chronic L Charcot foot ulceration and has been on doxy with wound care.  He was last seen on 8/25 and was planning for BKA this coming Wednesday (8/31).   The patient worked yesterday and drove home but was too tired to get out of the car and fell asleep in the driveway.  Neighbors helped him inside and then he was too weak to get off the commode and so he came to the ER.  He denied any additional physical concerns at the time of our discussion.  He did report unstable living situation (landlord is evicting him), need for assistance in obtaining short and long-term disability through his work, and probable need for placement after surgery since he does not have other available housing options.  Reviewed I/O's: -212 ml x 24 hours and +1.7 L since admission   UOP: 800 ml x 24 hours  Per orthopedics notes, lt BKA has been postponed to Friday, 04/21/21 per pt request.   Spoke with pt and son at bedside. He reports  that his appetite is fair. Pt has been consuming about 40% of meals. He is requesting lactaid pills, so that he can consume the ice cream and other items off of his meal trays to improve his oral intake. Case discussed with MD, who confirms pt has order for lactaid.   Pt reports he is having difficulty with the oral nutrition supplements ("they all do not work for me"). He declines offer of Boost Breeze. Ensure, Glucerna, and Ensure Max. Per pt, he has drank Muscle Milk in the past without issue. Reviewed other supplement options on formulary; pt amenable to Prosource. Discussed importance of good meal and supplement intake to promote healing.   Medications review and include demadex.   Labs reviewed.   Diet Order:   Diet Order             Diet Carb Modified Fluid consistency: Thin; Room service appropriate? Yes  Diet effective now                   EDUCATION NEEDS:   Education needs have been addressed  Skin:  Skin Assessment: Skin Integrity Issues: Skin Integrity Issues:: Stage II, Diabetic Ulcer Stage II: buttocks Diabetic Ulcer: lt medial foot  Last BM:  04/19/21  Height:   Ht Readings from Last 1 Encounters:  04/16/21 '6\' 2"'$  (1.88 m)    Weight:   Wt Readings from Last 1 Encounters:  04/16/21 90.7 kg    Ideal Body Weight:  86.4 kg  BMI:  Body mass index is 25.68 kg/m.  Estimated Nutritional Needs:   Kcal:  2400-2600  Protein:  125-150 grams  Fluid:  > 2 L    Loistine Chance, RD, LDN, [place] Registered Dietitian II Certified Diabetes Care and Education Specialist Please refer to [facility] for RD and/or RD on-call/weekend/after hours pager

## 2021-04-19 NOTE — H&P (View-Only) (Signed)
Patient ID: Stephen Whitaker, male   DOB: 06/18/1956, 65 y.o.   MRN: [id number] Patient was drinking this morning.  He states he wants to postpone surgery.  I have canceled surgery today and will reschedule surgery for Friday.

## 2021-04-19 NOTE — Progress Notes (Signed)
Progress Note  Patient Name: Stephen Whitaker Date of Encounter: 04/19/2021  Primary Cardiologist: [name], MD   Subjective   No changes from prior.  Suspect that patient asked to defer surgery to Friday, but unclear in discussions with patient.  No CP, no SOB.  Inpatient Medications    Scheduled Meds:  allopurinol  300 mg Oral Daily   feeding supplement  237 mL Oral TID BM   feeding supplement  296 mL Oral Once   fluticasone  1 spray Each Nare BID   lactase  6,000 Units Oral TID WC   loratadine  10 mg Oral Daily   metoprolol succinate  25 mg Oral Daily   mometasone-formoterol  2 puff Inhalation BID   montelukast  10 mg Oral QHS   multivitamin with minerals  1 tablet Oral Daily   mupirocin ointment  1 application Nasal BID   nystatin  5 mL Oral QID   olopatadine  1 drop Both Eyes BID   pantoprazole  40 mg Oral BID   torsemide  40 mg Oral Daily   Continuous Infusions:  cefTRIAXone (ROCEPHIN)  IV Stopped (04/18/21 0930)   metronidazole 500 mg (04/18/21 2331)   vancomycin 1,500 mg (04/19/21 0450)   PRN Meds: acetaminophen **OR** acetaminophen, albuterol, bisacodyl, hydrALAZINE, HYDROcodone-acetaminophen, morphine injection, ondansetron **OR** ondansetron (ZOFRAN) IV, polyethylene glycol   Vital Signs    Vitals:   04/18/21 2100 04/19/21 0439 04/19/21 0816 04/19/21 0900  BP:  (!) 124/97  (!) 123/97  Pulse:  (!) 102  99  Resp:  17  18  Temp:  98.7 F (37.1 C)  97.6 F (36.4 C)  TempSrc:  Oral  Axillary  SpO2: 96% 99% 95% 97%  Weight:      Height:        Intake/Output Summary (Last 24 hours) at 04/19/2021 1257 Last data filed at 04/19/2021 1238 Gross per 24 hour  Intake 599.1 ml  Output 1050 ml  Net -450.9 ml   Filed Weights   04/16/21 1958  Weight: 90.7 kg    Physical Exam   General: Well developed, well nourished, NAD Neck: Negative for carotid bruits. No JVD Lungs:Clear to ausculation bilaterally. Breathing is  unlabored. Cardiovascular: RRR with S1 S2. No murmurs Extremities: 1-2+ BLE edema. Radial pulses 2+ bilaterally, no purulence from left distal foot Neuro: Alert and oriented. No focal deficits. No facial asymmetry. MAE spontaneously. Psych: Responds to questions appropriately with normal affect.    Labs    Chemistry Recent Labs  Lab 04/17/21 0414 04/18/21 0201 04/19/21 0014  NA 136 137 138  K 3.8 3.6 3.7  CL 101 104 104  CO2 '26 25 26  '$ GLUCOSE 79 91 103*  BUN 45* 37* 33*  CREATININE 1.82* 1.61* 1.68*  CALCIUM 8.3* 8.3* 8.3*  PROT  --  6.2*  --   ALBUMIN  --  1.9*  --   AST  --  60*  --   ALT  --  36  --   ALKPHOS  --  84  --   BILITOT  --  1.2  --   GFRNONAA 40* 47* 45*  ANIONGAP '9 8 8     '$ Hematology Recent Labs  Lab 04/15/21 1714 04/17/21 0414  WBC 8.9 4.7  RBC 5.30 4.75  HGB 14.3 12.8*  HCT 44.9 39.7  MCV 84.7 83.6  MCH 27.0 26.9  MCHC 31.8 32.2  RDW 17.2* 16.7*  PLT 422* 341    Cardiac EnzymesNo  results for input(s): TROPONINI in the last 168 hours. No results for input(s): TROPIPOC in the last 168 hours.   BNP Recent Labs  Lab 04/15/21 1714  BNP >4,500.0*     DDimer No results for input(s): DDIMER in the last 168 hours.   Radiology    No results found.  Telemetry    SR  and sinus tachycardia with PVCs  - Personally Reviewed  ECG    No new tracing as of 04/18/21 - Personally Reviewed  Cardiac Studies   Right and left heart cath 12/12/20 [name] has a nonischemic cardiomyopathy.  He has clean coronary arteries.  He has moderately elevated wedge and filling pressures.  Medical therapy will be recommended with guideline directed optimal medical therapy. Dr. Julieanne Manson , the patient's primary cardiologist, was notified of these results.  The radial sheath was removed and a TR band was placed on the right wrist to achieve patent hemostasis.  The patient left lab in stable condition.   Echo 12/08/20:  1. Left ventricular ejection fraction,  by estimation, is 20 to 25%. The  left ventricle has severely decreased function. The left ventricle  demonstrates global hypokinesis. The left ventricular internal cavity size  was mildly dilated. Left ventricular  diastolic parameters are consistent with Grade II diastolic dysfunction  (pseudonormalization).   2. Right ventricular systolic function is mildly reduced. The right  ventricular size is moderately enlarged. There is moderately elevated  pulmonary artery systolic pressure. The estimated right ventricular  systolic pressure is [id number] mmHg.   3. Left atrial size was severely dilated.   4. Right atrial size was severely dilated.   5. A small pericardial effusion is present. The pericardial effusion is  posterior to the left ventricle.   6. The mitral valve is normal in structure. Mild to moderate mitral valve  regurgitation.   7. Tricuspid valve regurgitation is moderate.   8. The aortic valve is tricuspid. There is mild thickening of the aortic  valve. Aortic valve regurgitation is trivial. Mild aortic valve sclerosis  is present, with no evidence of aortic valve stenosis.   9. Aortic dilatation noted. There is mild dilatation of the ascending  aorta, measuring 37 mm.  10. The inferior vena cava is dilated in size with <50% respiratory  variability, suggesting right atrial pressure of 15 mmHg.    Heart monitor 10/2020: Short runs of SVT, possibly Atrial Tachycardia Plan: Start metoprolol 12.5 mg PO BID  Patient Profile     65 y.o. male with a hx of HFrEF, HTN, DM, COPD, GERD, moderate TR, PSVT, and gout who is being seen 04/17/2021 for the evaluation of preop clearance at the request of Dr. Algis Liming.  Assessment & Plan    1. Preoperative cardiac clearance: - acceptable cardiac risk and optimized for surgery -Plan for left BKA in the setting of osteomylitis on Friday  2. Chronic systolic and diastolic CHF/NICM: -LVEF 0000000 with G2DD per echo 11/2019 -GDMT with beta  blocker, farxiga, Entresto, and torsemide - torsemide and metoprolol have been returned - at discharge Delene Loll will hopefully be restarted  3. Acute on chronic kidney disease: -Creatinine, 1.94 on admission>>1.68 today  -Baseline appears to be in the 1.2-1.3 range (though this may be new baseline)  -Holding Entresto  -Plan to restart after surgery  4. SVT/atrial tachycardia: -Consider telemetry  -On low dose beta blocker   5. Osteomyelitis: -Plan for left BKA Friday -Abx per IM   [facility] will sign off.   Medication Recommendations:  Torsemide and metoprolol; post surgery restart of Entresto 24/26 unless hypotension Other recommendations (labs, testing, etc):  post discharge BMP Follow up as an outpatient:  we are working to arrange follow up   Rudean Haskell, [address] [place], [place] [address] [phone]  12:57 PM

## 2021-04-19 NOTE — Hospital Course (Signed)
65 year old black male community dwelling known COPD, systolic CHF EF 0000000, DM TY 2, HTN, CKD 3B, SVT atrial tachycardia, gout, severe malnutrition Follows Dr. Sharol Given 2/2 to chronic left Charcot foot ulceration-been on doxycycline--scheduled to have BKA 04/19/2021 Started on Rocephin Flagyl vancomycin on admission-ABIs ordered on admission found to have mild AKI, thrush and started on nystatin Cardiology noted moderate risk for surgery but cleared for the same  Underwent BKA amputation 04/19/2021   BUNs/creatinine 37/1.6-->33/1.6 Potassium 3.7

## 2021-04-19 NOTE — Interval H&P Note (Signed)
History and Physical Interval Note:  04/19/2021 6:52 AM  Stephen Whitaker  has presented today for surgery, with the diagnosis of Osteomyelitis Left Foot.  The various methods of treatment have been discussed with the patient and family. After consideration of risks, benefits and other options for treatment, the patient has consented to  Procedure(s): LEFT BELOW KNEE AMPUTATION (Left) as a surgical intervention.  The patient's history has been reviewed, patient examined, no change in status, stable for surgery.  I have reviewed the patient's chart and labs.  Questions were answered to the patient's satisfaction.     [name]

## 2021-04-19 NOTE — Progress Notes (Signed)
Patient ID: Stephen Whitaker, male   DOB: 05/09/1956, 65 y.o.   MRN: [id number] Patient was drinking this morning.  He states he wants to postpone surgery.  I have canceled surgery today and will reschedule surgery for Friday.

## 2021-04-19 NOTE — Progress Notes (Signed)
Patient refused to sign Informed Consent and would like for speak to the MD about procedure again. Place call and spoke with surgeon. He will stop by today and will most likely be pushed to Friday per MD.

## 2021-04-19 NOTE — Progress Notes (Signed)
PROGRESS NOTE   Stephen Whitaker  [id number] DOB: 23-Jun-1956 DOA: 04/15/2021 PCP: [name], NP  Brief Narrative:   65 year old black male community dwelling known COPD, systolic CHF EF 0000000, DM TY 2, HTN, CKD 3B, SVT atrial tachycardia, gout, severe malnutrition Follows Dr. Sharol Given 2/2 to chronic left Charcot foot ulceration-been on doxycycline--scheduled to have BKA 04/19/2021 Started on Rocephin Flagyl vancomycin on admission-ABIs ordered on admission found to have mild AKI, thrush and started on nystatin Cardiology noted moderate risk for surgery but cleared for the same  Awaiting amputation which had to be postponed until 04/21/2021 at patient request  Hospital-Problem based course  Left Charcot foot ulceration Surgery rescheduled for 9/2 per Dr. Sharol Given Continue pain control Norco 1-2 tab every 4 as needed moderate pain, morphine 2 mg every 2 as needed severe pain Continuing Flagyl/vancomycin/ceftriaxone until surgery HFrEF chronic without any acute component One-time dose Lasix 40 IV given 8/30--patient has mild JVD laying flat so we will start back Demadex at 40 and monitor labs Metoprolol XL 25 daily Entresto twice daily held at this time AKI superimposed on CKD 3B baseline 1.2 Meds adjusted as above-follow daily labs Chronic gout Continue allopurinol 300 daily COPD without exacerbation Continue Dulera 2 puffs twice daily, Singulair 10 at bedtime, albuterol every 4 as needed for wheeze Oropharyngeal thrush Continue nystatin 4 times daily started on 8/28, finish at least 10 days in the outpatient setting SVT atrial tachycardia in the past Continues on beta-blocker as above keep on monitors for now   DVT prophylaxis: SCD Code Status: Full Family Communication: None at bedside Disposition:  Status is: Inpatient  Remains inpatient appropriate because:Hemodynamically unstable, Unsafe d/c plan, IV treatments appropriate due to intensity of illness or inability to take  PO, and Inpatient level of care appropriate due to severity of illness  Dispo: The patient is from: Home              Anticipated d/c is to: SNF              Patient currently is not medically stable to d/c.   Difficult to place patient No       Consultants:  Orthopedics  Procedures:   Antimicrobials: As above   Subjective:  Seen while getting a bed bath Feels a little short winded no chest pain no fever Overall seems somewhat comfortable although a little sleepy  Objective: Vitals:   04/18/21 2100 04/19/21 0439 04/19/21 0816 04/19/21 0900  BP:  (!) 124/97  (!) 123/97  Pulse:  (!) 102  99  Resp:  17  18  Temp:  98.7 F (37.1 C)  97.6 F (36.4 C)  TempSrc:  Oral  Axillary  SpO2: 96% 99% 95% 97%  Weight:      Height:        Intake/Output Summary (Last 24 hours) at 04/19/2021 1052 Last data filed at 04/19/2021 0900 Gross per 24 hour  Intake 599.1 ml  Output 800 ml  Net -200.9 ml   Filed Weights   04/16/21 1958  Weight: 90.7 kg    Examination:  Awake coherent mild JVD noted at the bedside [id number] holosystolic murmur Abdomen soft no rebound no guarding No lower extremity edema No overt rales rhonchi anteriorly  Data Reviewed: personally reviewed   CBC    Component Value Date/Time   WBC 4.7 04/17/2021 0414   RBC 4.75 04/17/2021 0414   HGB 12.8 (L) 04/17/2021 0414   HGB 11.6 (L) 12/08/2020 1636   HGB 12.4 (  L) 08/08/2010 1010   HCT 39.7 04/17/2021 0414   HCT 36.7 (L) 12/08/2020 1636   HCT 37.5 (L) 08/08/2010 1010   PLT 341 04/17/2021 0414   PLT 275 12/08/2020 1636   MCV 83.6 04/17/2021 0414   MCV 84 12/08/2020 1636   MCV 84.3 08/08/2010 1010   MCH 26.9 04/17/2021 0414   MCHC 32.2 04/17/2021 0414   RDW 16.7 (H) 04/17/2021 0414   RDW 13.7 12/08/2020 1636   RDW 13.9 08/08/2010 1010   LYMPHSABS 0.5 (L) 04/15/2021 1714   LYMPHSABS 0.9 06/26/2019 1035   LYMPHSABS 1.0 08/08/2010 1010   MONOABS 0.6 04/15/2021 1714   MONOABS 0.5 08/08/2010 1010    EOSABS 0.0 04/15/2021 1714   EOSABS 0.1 06/26/2019 1035   BASOSABS 0.0 04/15/2021 1714   BASOSABS 0.0 06/26/2019 1035   BASOSABS 0.0 08/08/2010 1010   CMP Latest Ref Rng & Units 04/19/2021 04/18/2021 04/17/2021  Glucose 70 - 99 mg/dL 103(H) 91 79  BUN 8 - 23 mg/dL 33(H) 37(H) 45(H)  Creatinine 0.61 - 1.24 mg/dL 1.68(H) 1.61(H) 1.82(H)  Sodium 135 - 145 mmol/L 138 137 136  Potassium 3.5 - 5.1 mmol/L 3.7 3.6 3.8  Chloride 98 - 111 mmol/L 104 104 101  CO2 22 - 32 mmol/L '26 25 26  '$ Calcium 8.9 - 10.3 mg/dL 8.3(L) 8.3(L) 8.3(L)  Total Protein 6.5 - 8.1 g/dL - 6.2(L) -  Total Bilirubin 0.3 - 1.2 mg/dL - 1.2 -  Alkaline Phos 38 - 126 U/L - 84 -  AST 15 - 41 U/L - 60(H) -  ALT 0 - 44 U/L - 36 -     Radiology Studies: No results found.   Scheduled Meds:  allopurinol  300 mg Oral Daily   feeding supplement  237 mL Oral TID BM   feeding supplement  296 mL Oral Once   fluticasone  1 spray Each Nare BID   lactase  6,000 Units Oral TID WC   loratadine  10 mg Oral Daily   metoprolol succinate  25 mg Oral Daily   mometasone-formoterol  2 puff Inhalation BID   montelukast  10 mg Oral QHS   multivitamin with minerals  1 tablet Oral Daily   mupirocin ointment  1 application Nasal BID   nystatin  5 mL Oral QID   olopatadine  1 drop Both Eyes BID   pantoprazole  40 mg Oral BID   Continuous Infusions:  cefTRIAXone (ROCEPHIN)  IV Stopped (04/18/21 0930)   metronidazole 500 mg (04/18/21 2331)   vancomycin 1,500 mg (04/19/21 0450)     LOS: 3 days   Time spent: [place], MD [facility] To contact the attending provider between 7A-7P or the covering provider during after hours 7P-7A, please log into the web site [email or web address] and access using universal [place] password for that web site. If you do not have the password, please call the hospital operator.  04/19/2021, 10:52 AM

## 2021-04-20 LAB — COMPREHENSIVE METABOLIC PANEL
ALT: 28 U/L (ref 0–44)
AST: 44 U/L — ABNORMAL HIGH (ref 15–41)
Albumin: 1.8 g/dL — ABNORMAL LOW (ref 3.5–5.0)
Alkaline Phosphatase: 83 U/L (ref 38–126)
Anion gap: 11 (ref 5–15)
BUN: 36 mg/dL — ABNORMAL HIGH (ref 8–23)
CO2: 23 mmol/L (ref 22–32)
Calcium: 8.4 mg/dL — ABNORMAL LOW (ref 8.9–10.3)
Chloride: 105 mmol/L (ref 98–111)
Creatinine, Ser: 1.54 mg/dL — ABNORMAL HIGH (ref 0.61–1.24)
GFR, Estimated: 49 mL/min — ABNORMAL LOW (ref >60)
Glucose, Bld: 85 mg/dL (ref 70–99)
Potassium: 3.7 mmol/L (ref 3.5–5.1)
Sodium: 139 mmol/L (ref 135–145)
Total Bilirubin: 0.9 mg/dL (ref 0.3–1.2)
Total Protein: 6.1 g/dL — ABNORMAL LOW (ref 6.5–8.1)

## 2021-04-20 LAB — GLUCOSE, CAPILLARY
Glucose-Capillary: 36 mg/dL — CL (ref 70–99)
Glucose-Capillary: 156 mg/dL — ABNORMAL HIGH (ref 70–99)
Glucose-Capillary: 106 mg/dL — ABNORMAL HIGH (ref 70–99)
Glucose-Capillary: 73 mg/dL (ref 70–99)

## 2021-04-20 MED ORDER — DEXTROSE 50 % IV SOLN
1.0000 | Freq: Once | INTRAVENOUS | Status: AC
  Administered 2021-04-20: 50 mL via INTRAVENOUS
  Filled 2021-04-20: qty 50

## 2021-04-20 MED ORDER — DEXTROSE 5 % IV SOLN: INTRAVENOUS | Status: DC

## 2021-04-20 MED ORDER — DEXTROSE 50 % IV SOLN
INTRAVENOUS | Status: AC
  Filled 2021-04-20: qty 50

## 2021-04-20 NOTE — Progress Notes (Signed)
Patient seen at bedside today.  Declined surgery yesterday.  Discussed with him whether he wished to go forward tomorrow.  He said "I have no choice "  Explained to the patient that certainly is his choice but would highly recommend going forward with the surgery as infection can progress.  Orders placed for surgery tomorrow transtibial amputation

## 2021-04-20 NOTE — Progress Notes (Signed)
PROGRESS NOTE   Stephen Whitaker  [id number] DOB: 27-Sep-1955 DOA: 04/15/2021 PCP: [name], NP  Brief Narrative:   65 year old black male community dwelling known COPD, systolic CHF EF 0000000, DM TY 2, HTN, CKD 3B, SVT atrial tachycardia, gout, severe malnutrition Follows Dr. Sharol Given 2/2 to chronic left Charcot foot ulceration-been on doxycycline--scheduled to have BKA 04/19/2021 Started on Rocephin Flagyl vancomycin on admission-ABIs ordered on admission found to have mild AKI, thrush and started on nystatin Cardiology noted moderate risk for surgery but cleared for the same  Awaiting amputation which had to be postponed until 04/21/2021 at patient request  Hospital-Problem based course  Left Charcot foot ulceration Surgery rescheduled for 9/2 per Dr. Merri Brunette that patient complies with recommendations-discussed with him if not , might end up having to amputate more than we expected to initially Continue pain control Norco 1-2 tab every 4 as needed moderate pain, morphine 2 mg every 2 as needed severe pain Continuing Flagyl/vancomycin/ceftriaxone until surgery HFrEF chronic without any acute component Resumed home dose of Demadex 40 on 8/31, continues metoprolol XL 25 daily Entresto twice daily held at this time AKI superimposed on CKD 3B baseline 1.2 Meds adjusted as above Chronic gout Continue allopurinol 300 daily COPD without exacerbation Continue Dulera 2 puffs twice daily, Singulair 10 at bedtime, albuterol every 4 as needed for wheeze Oropharyngeal thrush Continue nystatin 4 times daily started on 8/28, finish at least 10 days in the outpatient setting SVT atrial tachycardia in the past Continues on beta-blocker as above keep on monitors for now   DVT prophylaxis: SCD Code Status: Full Family Communication: None at bedside Disposition:  Status is: Inpatient  Remains inpatient appropriate because:Hemodynamically unstable, Unsafe d/c plan, IV treatments  appropriate due to intensity of illness or inability to take PO, and Inpatient level of care appropriate due to severity of illness  Dispo: The patient is from: Home              Anticipated d/c is to: SNF              Patient currently is not medically stable to d/c.   Difficult to place patient No       Consultants:  Orthopedics  Procedures:   Antimicrobials: As above   Subjective:  Overall doing fair Seems to be more accepting of need for surgery although does not agree? We had a long discussion as above regarding the need for the same He has no chest pain He seems to have some abdominal tenderness I did not examine his left lower extremity wound  Objective: Vitals:   04/19/21 2020 04/20/21 0520 04/20/21 0811 04/20/21 0918  BP: (!) 118/91 (!) 121/91 (!) 124/95   Pulse: 98 93 94   Resp: '18 17 18   '$ Temp:  97.8 F (36.6 C) 98.2 F (36.8 C)   TempSrc:  Oral    SpO2: 97% 96% 99% 98%  Weight:      Height:        Intake/Output Summary (Last 24 hours) at 04/20/2021 1008 Last data filed at 04/20/2021 [id number] Gross per 24 hour  Intake 100 ml  Output 2500 ml  Net -2400 ml    Filed Weights   04/16/21 1958  Weight: 90.7 kg    Examination:  coherent mild JVD noted at the bedside Q000111Q holosystolic murmur across precordium Abdomen soft no rebound no guarding No lower extremity edema, left lower extremity wound is wrapped in a dressing and patient has a Actor  on that side Chest is clear anterolaterally Affect is somewhat flat  Data Reviewed: personally reviewed   CBC    Component Value Date/Time   WBC 4.7 04/17/2021 0414   RBC 4.75 04/17/2021 0414   HGB 12.8 (L) 04/17/2021 0414   HGB 11.6 (L) 12/08/2020 1636   HGB 12.4 (L) 08/08/2010 1010   HCT 39.7 04/17/2021 0414   HCT 36.7 (L) 12/08/2020 1636   HCT 37.5 (L) 08/08/2010 1010   PLT 341 04/17/2021 0414   PLT 275 12/08/2020 1636   MCV 83.6 04/17/2021 0414   MCV 84 12/08/2020 1636   MCV 84.3  08/08/2010 1010   MCH 26.9 04/17/2021 0414   MCHC 32.2 04/17/2021 0414   RDW 16.7 (H) 04/17/2021 0414   RDW 13.7 12/08/2020 1636   RDW 13.9 08/08/2010 1010   LYMPHSABS 0.5 (L) 04/15/2021 1714   LYMPHSABS 0.9 06/26/2019 1035   LYMPHSABS 1.0 08/08/2010 1010   MONOABS 0.6 04/15/2021 1714   MONOABS 0.5 08/08/2010 1010   EOSABS 0.0 04/15/2021 1714   EOSABS 0.1 06/26/2019 1035   BASOSABS 0.0 04/15/2021 1714   BASOSABS 0.0 06/26/2019 1035   BASOSABS 0.0 08/08/2010 1010   CMP Latest Ref Rng & Units 04/20/2021 04/19/2021 04/18/2021  Glucose 70 - 99 mg/dL 85 103(H) 91  BUN 8 - 23 mg/dL 36(H) 33(H) 37(H)  Creatinine 0.61 - 1.24 mg/dL 1.54(H) 1.68(H) 1.61(H)  Sodium 135 - 145 mmol/L 139 138 137  Potassium 3.5 - 5.1 mmol/L 3.7 3.7 3.6  Chloride 98 - 111 mmol/L 105 104 104  CO2 22 - 32 mmol/L '23 26 25  '$ Calcium 8.9 - 10.3 mg/dL 8.4(L) 8.3(L) 8.3(L)  Total Protein 6.5 - 8.1 g/dL 6.1(L) - 6.2(L)  Total Bilirubin 0.3 - 1.2 mg/dL 0.9 - 1.2  Alkaline Phos 38 - 126 U/L 83 - 84  AST 15 - 41 U/L 44(H) - 60(H)  ALT 0 - 44 U/L 28 - 36     Radiology Studies: No results found.   Scheduled Meds:  (feeding supplement) PROSource Plus  30 mL Oral TID BM   allopurinol  300 mg Oral Daily   feeding supplement  296 mL Oral Once   fluticasone  1 spray Each Nare BID   lactase  6,000 Units Oral TID WC   loratadine  10 mg Oral Daily   metoprolol succinate  25 mg Oral Daily   mometasone-formoterol  2 puff Inhalation BID   montelukast  10 mg Oral QHS   multivitamin with minerals  1 tablet Oral Daily   mupirocin ointment  1 application Nasal BID   nystatin  5 mL Oral QID   olopatadine  1 drop Both Eyes BID   pantoprazole  40 mg Oral BID   torsemide  40 mg Oral Daily   Continuous Infusions:  cefTRIAXone (ROCEPHIN)  IV 2 g (04/20/21 0936)   metronidazole 500 mg (04/20/21 0506)   vancomycin 1,500 mg (04/20/21 0508)     LOS: 4 days   Time spent: 71  Nita Sells, MD [facility] To  contact the attending provider between 7A-7P or the covering provider during after hours 7P-7A, please log into the web site [email or web address] and access using universal [place] password for that web site. If you do not have the password, please call the hospital operator.  04/20/2021, 10:08 AM

## 2021-04-20 NOTE — Progress Notes (Signed)
   04/19/21 2357  Notify: Provider  Provider Name/Title J. Olena Heckle NP  Date Provider Notified 04/19/21  Time Provider Notified 2357  Notification Type  (amion)  Notification Reason Other (Comment) (Telemetry event ST/ATach. with pvc hr 132. asymptomatic.)  Provider response Other (Comment) (MD aware)  Date of Provider Response 04/20/21  Time of Provider Response 0000

## 2021-04-20 NOTE — Care Management Important Message (Signed)
Important Message  Patient Details  Name: Stephen Whitaker MRN: [id number] Date of Birth: Sep 10, 1955   Medicare Important Message Given:  Yes     Orbie Pyo 04/20/2021, 3:20 PM

## 2021-04-20 NOTE — Progress Notes (Signed)
Discussed with patient regarding signing surgical consent and patient is still contemplating the surgical procedure because he wants to see if the antibiotics will help heal his foot.

## 2021-04-21 ENCOUNTER — Inpatient Hospital Stay (HOSPITAL_COMMUNITY): Payer: BC Managed Care – PPO | Admitting: Certified Registered"

## 2021-04-21 ENCOUNTER — Encounter (HOSPITAL_COMMUNITY): Payer: Self-pay | Admitting: Internal Medicine

## 2021-04-21 ENCOUNTER — Encounter (HOSPITAL_COMMUNITY)
Admission: EM | Disposition: A | Discharge status: Skilled Nursing Facility | Payer: Self-pay | Source: Home / Self Care | Attending: Internal Medicine

## 2021-04-21 DIAGNOSIS — M86272 Subacute osteomyelitis, left ankle and foot: Secondary | ICD-10-CM | POA: Diagnosis not present

## 2021-04-21 DIAGNOSIS — E11628 Type 2 diabetes mellitus with other skin complications: Secondary | ICD-10-CM | POA: Diagnosis not present

## 2021-04-21 DIAGNOSIS — L089 Local infection of the skin and subcutaneous tissue, unspecified: Secondary | ICD-10-CM | POA: Diagnosis not present

## 2021-04-21 HISTORY — PX: AMPUTATION: SHX166

## 2021-04-21 LAB — CBC WITH DIFFERENTIAL/PLATELET
Abs Immature Granulocytes: 0.03 10*3/uL (ref 0.00–0.07)
Basophils Absolute: 0 10*3/uL (ref 0.0–0.1)
Basophils Relative: 0 %
Eosinophils Absolute: 0 10*3/uL (ref 0.0–0.5)
Eosinophils Relative: 0 %
HCT: 39.3 % (ref 39.0–52.0)
Hemoglobin: 12.5 g/dL — ABNORMAL LOW (ref 13.0–17.0)
Immature Granulocytes: 1 %
Lymphocytes Relative: 9 %
Lymphs Abs: 0.5 10*3/uL — ABNORMAL LOW (ref 0.7–4.0)
MCH: 26.9 pg (ref 26.0–34.0)
MCHC: 31.8 g/dL (ref 30.0–36.0)
MCV: 84.7 fL (ref 80.0–100.0)
Monocytes Absolute: 0.6 10*3/uL (ref 0.1–1.0)
Monocytes Relative: 10 %
Neutro Abs: 4.5 10*3/uL (ref 1.7–7.7)
Neutrophils Relative %: 80 %
Platelets: 387 10*3/uL (ref 150–400)
RBC: 4.64 MIL/uL (ref 4.22–5.81)
RDW: 17.2 % — ABNORMAL HIGH (ref 11.5–15.5)
WBC: 5.6 10*3/uL (ref 4.0–10.5)
nRBC: 0 % (ref 0.0–0.2)

## 2021-04-21 LAB — GLUCOSE, CAPILLARY
Glucose-Capillary: 100 mg/dL — ABNORMAL HIGH (ref 70–99)
Glucose-Capillary: 124 mg/dL — ABNORMAL HIGH (ref 70–99)
Glucose-Capillary: 124 mg/dL — ABNORMAL HIGH (ref 70–99)
Glucose-Capillary: 125 mg/dL — ABNORMAL HIGH (ref 70–99)
Glucose-Capillary: 134 mg/dL — ABNORMAL HIGH (ref 70–99)
Glucose-Capillary: 79 mg/dL (ref 70–99)
Glucose-Capillary: 92 mg/dL (ref 70–99)

## 2021-04-21 LAB — CULTURE, BLOOD (ROUTINE X 2)
Culture: NO GROWTH
Culture: NO GROWTH
Special Requests: ADEQUATE
Special Requests: ADEQUATE

## 2021-04-21 LAB — RENAL FUNCTION PANEL
Albumin: 1.8 g/dL — ABNORMAL LOW (ref 3.5–5.0)
Anion gap: 10 (ref 5–15)
BUN: 36 mg/dL — ABNORMAL HIGH (ref 8–23)
CO2: 23 mmol/L (ref 22–32)
Calcium: 8.2 mg/dL — ABNORMAL LOW (ref 8.9–10.3)
Chloride: 103 mmol/L (ref 98–111)
Creatinine, Ser: 1.73 mg/dL — ABNORMAL HIGH (ref 0.61–1.24)
GFR, Estimated: 43 mL/min — ABNORMAL LOW (ref >60)
Glucose, Bld: 130 mg/dL — ABNORMAL HIGH (ref 70–99)
Phosphorus: 3.4 mg/dL (ref 2.5–4.6)
Potassium: 3.4 mmol/L — ABNORMAL LOW (ref 3.5–5.1)
Sodium: 136 mmol/L (ref 135–145)

## 2021-04-21 LAB — CULTURE, BLOOD (SINGLE): Culture: NO GROWTH

## 2021-04-21 SURGERY — AMPUTATION BELOW KNEE
Anesthesia: General | Site: Knee | Laterality: Left

## 2021-04-21 MED ORDER — CHLORHEXIDINE GLUCONATE 0.12 % MT SOLN: 15.0000 mL | Freq: Once | OROMUCOSAL | Status: AC

## 2021-04-21 MED ORDER — FENTANYL CITRATE (PF) 100 MCG/2ML IJ SOLN
INTRAMUSCULAR | Status: AC
  Administered 2021-04-21: 50 ug via INTRAVENOUS
  Filled 2021-04-21: qty 2

## 2021-04-21 MED ORDER — PROPOFOL 10 MG/ML IV BOLUS
INTRAVENOUS | Status: AC
  Filled 2021-04-21: qty 20

## 2021-04-21 MED ORDER — DOCUSATE SODIUM 100 MG PO CAPS
100.0000 mg | ORAL_CAPSULE | Freq: Every day | ORAL | Status: DC
  Administered 2021-04-24: 100 mg via ORAL
  Filled 2021-04-21 (×3): qty 1

## 2021-04-21 MED ORDER — LIDOCAINE 2% (20 MG/ML) 5 ML SYRINGE
INTRAMUSCULAR | Status: AC
  Filled 2021-04-21: qty 5

## 2021-04-21 MED ORDER — FENTANYL CITRATE (PF) 100 MCG/2ML IJ SOLN: 25.0000 ug | INTRAMUSCULAR | Status: DC | PRN

## 2021-04-21 MED ORDER — ONDANSETRON HCL 4 MG/2ML IJ SOLN
INTRAMUSCULAR | Status: DC | PRN
  Administered 2021-04-21: 4 mg via INTRAVENOUS

## 2021-04-21 MED ORDER — ONDANSETRON HCL 4 MG/2ML IJ SOLN
INTRAMUSCULAR | Status: AC
  Filled 2021-04-21: qty 2

## 2021-04-21 MED ORDER — JUVEN PO PACK
1.0000 | PACK | Freq: Two times a day (BID) | ORAL | Status: DC
  Administered 2021-04-22 – 2021-04-25 (×6): 1 via ORAL
  Filled 2021-04-21 (×7): qty 1

## 2021-04-21 MED ORDER — EPHEDRINE 5 MG/ML INJ
INTRAVENOUS | Status: AC
  Filled 2021-04-21: qty 10

## 2021-04-21 MED ORDER — PHENYLEPHRINE 40 MCG/ML (10ML) SYRINGE FOR IV PUSH (FOR BLOOD PRESSURE SUPPORT)
PREFILLED_SYRINGE | INTRAVENOUS | Status: DC | PRN
  Administered 2021-04-21: 160 ug via INTRAVENOUS
  Administered 2021-04-21 (×2): 200 ug via INTRAVENOUS
  Administered 2021-04-21: 160 ug via INTRAVENOUS
  Administered 2021-04-21 (×2): 120 ug via INTRAVENOUS

## 2021-04-21 MED ORDER — ZINC SULFATE 220 (50 ZN) MG PO CAPS
220.0000 mg | ORAL_CAPSULE | Freq: Every day | ORAL | Status: DC
  Administered 2021-04-22 – 2021-05-02 (×11): 220 mg via ORAL
  Filled 2021-04-21 (×11): qty 1

## 2021-04-21 MED ORDER — DEXAMETHASONE SODIUM PHOSPHATE 10 MG/ML IJ SOLN
INTRAMUSCULAR | Status: AC
  Filled 2021-04-21: qty 1

## 2021-04-21 MED ORDER — DEXAMETHASONE SODIUM PHOSPHATE 10 MG/ML IJ SOLN
INTRAMUSCULAR | Status: DC | PRN
  Administered 2021-04-21: 10 mg via INTRAVENOUS

## 2021-04-21 MED ORDER — MAGNESIUM CITRATE PO SOLN
1.0000 | Freq: Once | ORAL | Status: DC | PRN
  Filled 2021-04-21: qty 296

## 2021-04-21 MED ORDER — ALUM & MAG HYDROXIDE-SIMETH 200-200-20 MG/5ML PO SUSP
15.0000 mL | ORAL | Status: DC | PRN
  Filled 2021-04-21: qty 30

## 2021-04-21 MED ORDER — PANTOPRAZOLE SODIUM 40 MG PO TBEC
40.0000 mg | DELAYED_RELEASE_TABLET | Freq: Every day | ORAL | Status: DC
Start: 2021-04-22 — End: 2021-04-30
  Administered 2021-04-22 – 2021-04-30 (×9): 40 mg via ORAL
  Filled 2021-04-21 (×9): qty 1

## 2021-04-21 MED ORDER — POLYETHYLENE GLYCOL 3350 17 G PO PACK: 17.0000 g | PACK | Freq: Every day | ORAL | Status: DC | PRN

## 2021-04-21 MED ORDER — EPHEDRINE SULFATE-NACL 50-0.9 MG/10ML-% IV SOSY
PREFILLED_SYRINGE | INTRAVENOUS | Status: DC | PRN
  Administered 2021-04-21 (×2): 10 mg via INTRAVENOUS

## 2021-04-21 MED ORDER — OXYCODONE HCL 5 MG/5ML PO SOLN: 5.0000 mg | Freq: Once | ORAL | Status: DC | PRN

## 2021-04-21 MED ORDER — MIDAZOLAM HCL 2 MG/2ML IJ SOLN
INTRAMUSCULAR | Status: AC
  Administered 2021-04-21: 1 mg via INTRAVENOUS
  Filled 2021-04-21: qty 2

## 2021-04-21 MED ORDER — MIDAZOLAM HCL 2 MG/2ML IJ SOLN: 1.0000 mg | Freq: Once | INTRAMUSCULAR | Status: AC

## 2021-04-21 MED ORDER — PHENYLEPHRINE 40 MCG/ML (10ML) SYRINGE FOR IV PUSH (FOR BLOOD PRESSURE SUPPORT)
PREFILLED_SYRINGE | INTRAVENOUS | Status: AC
  Filled 2021-04-21: qty 30

## 2021-04-21 MED ORDER — LABETALOL HCL 5 MG/ML IV SOLN: 10.0000 mg | INTRAVENOUS | Status: DC | PRN

## 2021-04-21 MED ORDER — CHLORHEXIDINE GLUCONATE 0.12 % MT SOLN
OROMUCOSAL | Status: AC
  Administered 2021-04-21: 15 mL via OROMUCOSAL
  Filled 2021-04-21: qty 15

## 2021-04-21 MED ORDER — CEFAZOLIN SODIUM-DEXTROSE 2-4 GM/100ML-% IV SOLN
2.0000 g | INTRAVENOUS | Status: AC
  Administered 2021-04-21: 2 g via INTRAVENOUS
  Filled 2021-04-21: qty 100

## 2021-04-21 MED ORDER — TRANEXAMIC ACID-NACL 1000-0.7 MG/100ML-% IV SOLN
1000.0000 mg | Freq: Once | INTRAVENOUS | Status: AC
  Administered 2021-04-21: 1000 mg via INTRAVENOUS
  Filled 2021-04-21: qty 100

## 2021-04-21 MED ORDER — PROPOFOL 10 MG/ML IV BOLUS
INTRAVENOUS | Status: DC | PRN
  Administered 2021-04-21: 130 mg via INTRAVENOUS

## 2021-04-21 MED ORDER — CLONIDINE HCL (ANALGESIA) 100 MCG/ML EP SOLN
EPIDURAL | Status: DC | PRN
  Administered 2021-04-21: 67 ug
  Administered 2021-04-21: 33 ug

## 2021-04-21 MED ORDER — 0.9 % SODIUM CHLORIDE (POUR BTL) OPTIME
TOPICAL | Status: DC | PRN
  Administered 2021-04-21: 1000 mL

## 2021-04-21 MED ORDER — CEFAZOLIN SODIUM-DEXTROSE 2-4 GM/100ML-% IV SOLN: 2.0000 g | Freq: Three times a day (TID) | INTRAVENOUS | Status: DC

## 2021-04-21 MED ORDER — LACTATED RINGERS IV SOLN: INTRAVENOUS | Status: DC

## 2021-04-21 MED ORDER — BISACODYL 5 MG PO TBEC: 5.0000 mg | DELAYED_RELEASE_TABLET | Freq: Every day | ORAL | Status: DC | PRN

## 2021-04-21 MED ORDER — ASCORBIC ACID 500 MG PO TABS
1000.0000 mg | ORAL_TABLET | Freq: Every day | ORAL | Status: DC
  Administered 2021-04-22 – 2021-05-02 (×11): 1000 mg via ORAL
  Filled 2021-04-21 (×11): qty 2

## 2021-04-21 MED ORDER — PHENOL 1.4 % MT LIQD: 1.0000 | OROMUCOSAL | Status: DC | PRN

## 2021-04-21 MED ORDER — OXYCODONE HCL 5 MG PO TABS
5.0000 mg | ORAL_TABLET | Freq: Once | ORAL | Status: DC | PRN
Start: 2021-04-21 — End: 2021-04-21

## 2021-04-21 MED ORDER — FENTANYL CITRATE (PF) 250 MCG/5ML IJ SOLN
INTRAMUSCULAR | Status: DC | PRN
  Administered 2021-04-21: 50 ug via INTRAVENOUS

## 2021-04-21 MED ORDER — LIDOCAINE 2% (20 MG/ML) 5 ML SYRINGE
INTRAMUSCULAR | Status: DC | PRN
  Administered 2021-04-21: 100 mg via INTRAVENOUS

## 2021-04-21 MED ORDER — PHENYLEPHRINE 40 MCG/ML (10ML) SYRINGE FOR IV PUSH (FOR BLOOD PRESSURE SUPPORT)
PREFILLED_SYRINGE | INTRAVENOUS | Status: AC
  Filled 2021-04-21: qty 10

## 2021-04-21 MED ORDER — SODIUM CHLORIDE 0.9 % IV SOLN: INTRAVENOUS | Status: DC

## 2021-04-21 MED ORDER — HYDRALAZINE HCL 20 MG/ML IJ SOLN: 5.0000 mg | INTRAMUSCULAR | Status: DC | PRN

## 2021-04-21 MED ORDER — MIDAZOLAM HCL 2 MG/2ML IJ SOLN
INTRAMUSCULAR | Status: AC
  Filled 2021-04-21: qty 2

## 2021-04-21 MED ORDER — BUPIVACAINE-EPINEPHRINE (PF) 0.5% -1:200000 IJ SOLN
INTRAMUSCULAR | Status: DC | PRN
  Administered 2021-04-21: 10 mL via PERINEURAL
  Administered 2021-04-21: 20 mL via PERINEURAL

## 2021-04-21 MED ORDER — ACETAMINOPHEN 500 MG PO TABS
1000.0000 mg | ORAL_TABLET | Freq: Once | ORAL | Status: AC
  Administered 2021-04-21: 1000 mg via ORAL
  Filled 2021-04-21: qty 2

## 2021-04-21 MED ORDER — ORAL CARE MOUTH RINSE: 15.0000 mL | Freq: Once | OROMUCOSAL | Status: AC

## 2021-04-21 MED ORDER — FENTANYL CITRATE (PF) 100 MCG/2ML IJ SOLN: 50.0000 ug | Freq: Once | INTRAMUSCULAR | Status: AC

## 2021-04-21 MED ORDER — ONDANSETRON HCL 4 MG/2ML IJ SOLN: 4.0000 mg | Freq: Four times a day (QID) | INTRAMUSCULAR | Status: DC | PRN

## 2021-04-21 MED ORDER — FENTANYL CITRATE (PF) 250 MCG/5ML IJ SOLN
INTRAMUSCULAR | Status: AC
  Filled 2021-04-21: qty 5

## 2021-04-21 MED ORDER — GUAIFENESIN-DM 100-10 MG/5ML PO SYRP: 15.0000 mL | ORAL_SOLUTION | ORAL | Status: DC | PRN

## 2021-04-21 SURGICAL SUPPLY — 37 items
BAG COUNTER SPONGE SURGICOUNT (BAG) ×1 IMPLANT
BLADE SAW RECIP 87.9 MT (BLADE) ×2 IMPLANT
BLADE SURG 21 STRL SS (BLADE) ×2 IMPLANT
BNDG COHESIVE 6X5 TAN STRL LF (GAUZE/BANDAGES/DRESSINGS) IMPLANT
CANISTER WOUND CARE 500ML ATS (WOUND CARE) ×2 IMPLANT
COVER SURGICAL LIGHT HANDLE (MISCELLANEOUS) ×2 IMPLANT
CUFF TOURN SGL QUICK 34 (TOURNIQUET CUFF) ×2
CUFF TRNQT CYL 34X4.125X (TOURNIQUET CUFF) ×1 IMPLANT
DRAPE DERMATAC (DRAPES) ×2 IMPLANT
DRAPE INCISE IOBAN 66X45 STRL (DRAPES) ×2 IMPLANT
DRAPE U-SHAPE 47X51 STRL (DRAPES) ×2 IMPLANT
DRESSING PREVENA PLUS CUSTOM (GAUZE/BANDAGES/DRESSINGS) ×1 IMPLANT
DRSG PREVENA PLUS CUSTOM (GAUZE/BANDAGES/DRESSINGS) ×2
DURAPREP 26ML APPLICATOR (WOUND CARE) ×2 IMPLANT
ELECT REM PT RETURN 9FT ADLT (ELECTROSURGICAL) ×2
ELECTRODE REM PT RTRN 9FT ADLT (ELECTROSURGICAL) ×1 IMPLANT
GLOVE SURG ORTHO LTX SZ9 (GLOVE) ×2 IMPLANT
GLOVE SURG UNDER POLY LF SZ9 (GLOVE) ×2 IMPLANT
GOWN STRL REUS W/ TWL XL LVL3 (GOWN DISPOSABLE) ×2 IMPLANT
GOWN STRL REUS W/TWL XL LVL3 (GOWN DISPOSABLE) ×4
KIT BASIN OR (CUSTOM PROCEDURE TRAY) ×2 IMPLANT
KIT TURNOVER KIT B (KITS) ×2 IMPLANT
MANIFOLD NEPTUNE II (INSTRUMENTS) ×2 IMPLANT
NS IRRIG 1000ML POUR BTL (IV SOLUTION) ×2 IMPLANT
PACK ORTHO EXTREMITY (CUSTOM PROCEDURE TRAY) ×2 IMPLANT
PAD ARMBOARD 7.5X6 YLW CONV (MISCELLANEOUS) ×2 IMPLANT
PREVENA RESTOR ARTHOFORM 46X30 (CANNISTER) ×2 IMPLANT
SPONGE T-LAP 18X18 ~~LOC~~+RFID (SPONGE) ×1 IMPLANT
STAPLER VISISTAT 35W (STAPLE) ×1 IMPLANT
STOCKINETTE IMPERVIOUS LG (DRAPES) ×2 IMPLANT
SUT ETHILON 2 0 PSLX (SUTURE) IMPLANT
SUT SILK 2 0 (SUTURE) ×2
SUT SILK 2-0 18XBRD TIE 12 (SUTURE) ×1 IMPLANT
SUT VIC AB 1 CTX 27 (SUTURE) ×4 IMPLANT
TOWEL GREEN STERILE (TOWEL DISPOSABLE) ×2 IMPLANT
TUBE CONNECTING 12X1/4 (SUCTIONS) ×2 IMPLANT
YANKAUER SUCT BULB TIP NO VENT (SUCTIONS) ×2 IMPLANT

## 2021-04-21 NOTE — Anesthesia Preprocedure Evaluation (Addendum)
Anesthesia Evaluation   Patient awake    Reviewed: Allergy & Precautions, NPO status , Patient's Chart, lab work & pertinent test results, reviewed documented beta blocker date and time   History of Anesthesia Complications Negative for: history of anesthetic complications  Airway Mallampati: II  TM Distance: >3 FB Neck ROM: Full    Dental  (+) Missing, Chipped,    Pulmonary COPD,  COPD inhaler,    Pulmonary exam normal        Cardiovascular hypertension, Pt. on medications and Pt. on home beta blockers Normal cardiovascular exam  TTE 11/2020: EF 20-25%, global hypokinesis, grade II DD, RV systolic function mildly reduced, RV moderately enlarged, moderately elevated PASP 54.7 mmHg, severe LAE/RAE, small pericardial effusion is present, mild to moderate MR, moderate TR, mild dilatation of ascending  aorta measuring 37 mm    Neuro/Psych negative neurological ROS  negative psych ROS   GI/Hepatic Neg liver ROS, GERD  Medicated and Controlled,  Endo/Other  diabetes, Type 2  Renal/GU Cr 1.73  negative genitourinary   Musculoskeletal  (+) Arthritis ,   Abdominal   Peds  Hematology  (+) anemia , Hgb 12.5   Anesthesia Other Findings Day of surgery medications reviewed with patient.  Reproductive/Obstetrics negative OB ROS                            Anesthesia Physical Anesthesia Plan  ASA: 3  Anesthesia Plan: General   Post-op Pain Management: GA combined w/ Regional for post-op pain   Induction: Intravenous  PONV Risk Score and Plan: 2 and Treatment may vary due to age or medical condition, Ondansetron, Dexamethasone and Midazolam  Airway Management Planned: LMA  Additional Equipment: None  Intra-op Plan:   Post-operative Plan: Extubation in OR  Informed Consent: I have reviewed the patients History and Physical, chart, labs and discussed the procedure including the risks, benefits  and alternatives for the proposed anesthesia with the patient or authorized representative who has indicated his/her understanding and acceptance.     Dental advisory given  Plan Discussed with: CRNA  Anesthesia Plan Comments:        Anesthesia Quick Evaluation

## 2021-04-21 NOTE — Interval H&P Note (Signed)
History and Physical Interval Note:  04/21/2021 6:55 AM  Stephen Whitaker  has presented today for surgery, with the diagnosis of Osteomyelitis Left Foot.  The various methods of treatment have been discussed with the patient and family. After consideration of risks, benefits and other options for treatment, the patient has consented to  Procedure(s): LEFT BELOW KNEE AMPUTATION (Left) as a surgical intervention.  The patient's history has been reviewed, patient examined, no change in status, stable for surgery.  I have reviewed the patient's chart and labs.  Questions were answered to the patient's satisfaction.     [name]

## 2021-04-21 NOTE — Progress Notes (Signed)
Orthopedic Tech Progress Note Patient Details:  Stephen Whitaker 06/28/1956 [id number] Called order into [facility] Patient ID: JAVIEL CLIFT, male   DOB: 26-Jan-1956, 65 y.o.   MRN: [id number]  Chip Boer 04/21/2021, 1:55 PM

## 2021-04-21 NOTE — Anesthesia Procedure Notes (Signed)
  Anesthesia Regional Block: Popliteal block   Pre-Anesthetic Checklist: , timeout performed,  Correct Patient, Correct Site, Correct Laterality,  Correct Procedure, Correct Position, site marked,  Risks and benefits discussed,  Pre-op evaluation,  At surgeon's request and post-op pain management  Laterality: Left  Prep: Maximum Sterile Barrier Precautions used, chloraprep       Needles:  Injection technique: Single-shot  Needle Type: Echogenic Stimulator Needle     Needle Length: 9cm  Needle Gauge: 22     Additional Needles:   Procedures:,,,, ultrasound used (permanent image in chart),,    Narrative:  Start time: 04/21/2021 12:03 PM End time: 04/21/2021 12:06 PM Injection made incrementally with aspirations every 5 mL.  Performed by: Personally  Anesthesiologist: Brennan Bailey, MD  Additional Notes: Risks, benefits, and alternative discussed. Patient gave consent for procedure. Patient prepped and draped in sterile fashion. Sedation administered, patient remains easily responsive to voice. Relevant anatomy identified with ultrasound guidance. Local anesthetic given in 5cc increments with no signs or symptoms of intravascular injection. No pain or paraesthesias with injection. Patient monitored throughout procedure with signs of LAST or immediate complications. Tolerated well. Ultrasound image placed in chart.  Tawny Asal, MD

## 2021-04-21 NOTE — Op Note (Signed)
   Date of Surgery: 04/21/2021  INDICATIONS: Mr. Stephen Whitaker is a 65 y.o.-year-old male who presents with osteomyelitis abscess and ulceration of the left heel.  PREOPERATIVE DIAGNOSIS: Osteomyelitis ulceration and abscess left heel  POSTOPERATIVE DIAGNOSIS: Same.  PROCEDURE: Transtibial amputation Application of Prevena wound VAC  SURGEON: Sharol Given, M.D.  ANESTHESIA:  general  IV FLUIDS AND URINE: See anesthesia records.  ESTIMATED BLOOD LOSS: See anesthesia records.  COMPLICATIONS: None.  DESCRIPTION OF PROCEDURE: The patient was brought to the operating room after undergoing regional anesthetic. After adequate levels of anesthesia were obtained patient's lower extremity was prepped using DuraPrep draped into a sterile field. A timeout was called. The foot was draped out of the sterile field with impervious stockinette. A transverse incision was made 11 cm distal to the tibial tubercle. This curved proximally and a large posterior flap was created. The tibia was transected 1 cm proximal to the skin incision. The fibula was transected just proximal to the tibial incision. The tibia was beveled anteriorly. A large posterior flap was created. The sciatic nerve was pulled cut and allowed to retract. The vascular bundles were suture ligated with 2-0 silk. The deep and superficial fascial layers were closed using #1 Vicryl. The skin was closed using staples and 2-0 nylon. The wound was covered with a Prevena customizable and arthroform wound VAC.  The dressing was sealed with dermatac there was a good suction fit. A prosthetic shrinker and limb protector were applied. Patient was taken to the PACU in stable condition.   DISCHARGE PLANNING:  Antibiotic duration: 24 hours  Weightbearing: Nonweightbearing on the operative extremity  Pain medication: Opioid pathway  Dressing care/ Wound VAC: Continue wound VAC for 1 week after discharge  Discharge to: Discharge planning based on therapy's  recommendations for possible inpatient rehabilitation, outpatient rehabilitation, or discharge to home with therapy  Follow-up: In the office 1 week post operative.  Meridee Score, MD [place] 1:08 PM

## 2021-04-21 NOTE — Anesthesia Procedure Notes (Signed)
Procedure Name: LMA Insertion Date/Time: 04/21/2021 12:25 PM Performed by: Lance Coon, CRNA Pre-anesthesia Checklist: Emergency Drugs available, Patient identified, Suction available, Patient being monitored and Timeout performed Patient Re-evaluated:Patient Re-evaluated prior to induction Oxygen Delivery Method: Circle system utilized Preoxygenation: Pre-oxygenation with 100% oxygen Induction Type: IV induction LMA: LMA inserted LMA Size: 4.0 Number of attempts: 1 Placement Confirmation: breath sounds checked- equal and bilateral and positive ETCO2 Tube secured with: Tape Dental Injury: Teeth and Oropharynx as per pre-operative assessment

## 2021-04-21 NOTE — Progress Notes (Signed)
PROGRESS NOTE   Stephen Whitaker  [id number] DOB: July 01, 1956 DOA: 04/15/2021 PCP: [name], NP  Brief Narrative:   65 year old black male community dwelling known COPD, systolic CHF EF 0000000, DM TY 2, HTN, CKD 3B, SVT atrial tachycardia, gout, severe malnutrition Follows Dr. Sharol Given 2/2 to chronic left Charcot foot ulceration-been on doxycycline--scheduled to have BKA 04/19/2021 Started on Rocephin Flagyl vancomycin on admission-ABIs ordered on admission found to have mild AKI, thrush and started on nystatin Cardiology noted moderate risk for surgery but cleared for the same  Awaiting amputation which had to be postponed until 04/21/2021 at patient request  Hospital-Problem based course  Left Charcot foot ulceration Surgery rescheduled for 9/2 per Dr. Sharol Given Continue pain control Norco 1-2 tab every 4 as needed moderate pain, morphine 2 mg every 2 as needed severe pain Continuing Flagyl/vancomycin/ceftriaxone until surgery Severe hypoglycemia secondary to poor p.o. intake 9/1 Not eating well therefore given 1 amp of D50 and then placed on rate of D5'@75'$  cc/h-no recurrence Avoid strict control at this time HFrEF chronic without any acute component Until postop we will hold Demadex 40 --is on D5 perioperatively continues metoprolol XL 25 daily Entresto twice daily held at this time AKI superimposed on CKD 3B baseline 1.2 Meds adjusted as above Chronic gout Continue allopurinol 300 daily COPD without exacerbation Continue Dulera 2 puffs twice daily, Singulair 10 at bedtime, albuterol every 4 as needed for wheeze Oropharyngeal thrush Continue nystatin 4 times daily started on 8/28, finish at least 10 days in the outpatient setting SVT atrial tachycardia in the past Continues on beta-blocker as above keep on monitors for now   DVT prophylaxis: SCD Code Status: Full Family Communication: None at bedside Disposition:  Status is: Inpatient  Remains inpatient appropriate  because:Hemodynamically unstable, Unsafe d/c plan, IV treatments appropriate due to intensity of illness or inability to take PO, and Inpatient level of care appropriate due to severity of illness  Dispo: The patient is from: Home              Anticipated d/c is to: SNF              Patient currently is not medically stable to d/c.   Difficult to place patient No       Consultants:  Orthopedics  Procedures:   Antimicrobials: As above   Subjective:  Overall ready for surgery-has signed the consent Some questions about his glycemic control   Objective: Vitals:   04/20/21 0918 04/20/21 1616 04/20/21 2159 04/21/21 0414  BP:  (!) 123/100 132/89 (!) 119/93  Pulse:  97 96 92  Resp:  '19 18 16  '$ Temp:  98.2 F (36.8 C) 98.1 F (36.7 C) 98 F (36.7 C)  TempSrc:  Oral Oral Oral  SpO2: 98% 100% 99% 98%  Weight:      Height:        Intake/Output Summary (Last 24 hours) at 04/21/2021 [id number] Last data filed at 04/21/2021 0408 Gross per 24 hour  Intake 2016.84 ml  Output 1450 ml  Net 566.84 ml    Filed Weights   04/16/21 1958  Weight: 90.7 kg    Examination:  Depressed African-American male no distress mild JVD noted at the bedside [id number] holosystolic murmur  Abdomen soft no rebound no guarding Left lower extremity with Prevalon boot on that side Chest is clear anterolaterally  Data Reviewed: personally reviewed   CBC    Component Value Date/Time   WBC 5.6 04/21/2021 0024   RBC 4.64  04/21/2021 0024   HGB 12.5 (L) 04/21/2021 0024   HGB 11.6 (L) 12/08/2020 1636   HGB 12.4 (L) 08/08/2010 1010   HCT 39.3 04/21/2021 0024   HCT 36.7 (L) 12/08/2020 1636   HCT 37.5 (L) 08/08/2010 1010   PLT 387 04/21/2021 0024   PLT 275 12/08/2020 1636   MCV 84.7 04/21/2021 0024   MCV 84 12/08/2020 1636   MCV 84.3 08/08/2010 1010   MCH 26.9 04/21/2021 0024   MCHC 31.8 04/21/2021 0024   RDW 17.2 (H) 04/21/2021 0024   RDW 13.7 12/08/2020 1636   RDW 13.9 08/08/2010 1010   LYMPHSABS  0.5 (L) 04/21/2021 0024   LYMPHSABS 0.9 06/26/2019 1035   LYMPHSABS 1.0 08/08/2010 1010   MONOABS 0.6 04/21/2021 0024   MONOABS 0.5 08/08/2010 1010   EOSABS 0.0 04/21/2021 0024   EOSABS 0.1 06/26/2019 1035   BASOSABS 0.0 04/21/2021 0024   BASOSABS 0.0 06/26/2019 1035   BASOSABS 0.0 08/08/2010 1010   CMP Latest Ref Rng & Units 04/21/2021 04/20/2021 04/19/2021  Glucose 70 - 99 mg/dL 130(H) 85 103(H)  BUN 8 - 23 mg/dL 36(H) 36(H) 33(H)  Creatinine 0.61 - 1.24 mg/dL 1.73(H) 1.54(H) 1.68(H)  Sodium 135 - 145 mmol/L 136 139 138  Potassium 3.5 - 5.1 mmol/L 3.4(L) 3.7 3.7  Chloride 98 - 111 mmol/L 103 105 104  CO2 22 - 32 mmol/L '23 23 26  '$ Calcium 8.9 - 10.3 mg/dL 8.2(L) 8.4(L) 8.3(L)  Total Protein 6.5 - 8.1 g/dL - 6.1(L) -  Total Bilirubin 0.3 - 1.2 mg/dL - 0.9 -  Alkaline Phos 38 - 126 U/L - 83 -  AST 15 - 41 U/L - 44(H) -  ALT 0 - 44 U/L - 28 -     Radiology Studies: No results found.   Scheduled Meds:  (feeding supplement) PROSource Plus  30 mL Oral TID BM   allopurinol  300 mg Oral Daily   feeding supplement  296 mL Oral Once   fluticasone  1 spray Each Nare BID   lactase  6,000 Units Oral TID WC   loratadine  10 mg Oral Daily   metoprolol succinate  25 mg Oral Daily   mometasone-formoterol  2 puff Inhalation BID   montelukast  10 mg Oral QHS   multivitamin with minerals  1 tablet Oral Daily   mupirocin ointment  1 application Nasal BID   nystatin  5 mL Oral QID   olopatadine  1 drop Both Eyes BID   pantoprazole  40 mg Oral BID   torsemide  40 mg Oral Daily   Continuous Infusions:   ceFAZolin (ANCEF) IV     cefTRIAXone (ROCEPHIN)  IV Stopped (04/20/21 1006)   dextrose 75 mL/hr at 04/20/21 1208   metronidazole 500 mg (04/21/21 0618)   vancomycin 1,500 mg (04/21/21 0408)     LOS: 5 days   Time spent: 69  Nita Sells, MD [facility] To contact the attending provider between 7A-7P or the covering provider during after hours 7P-7A, please log into the  web site [email or web address] and access using universal [place] password for that web site. If you do not have the password, please call the hospital operator.  04/21/2021, 8:26 AM

## 2021-04-21 NOTE — Anesthesia Procedure Notes (Signed)
Anesthesia Regional Block: Adductor canal block   Pre-Anesthetic Checklist: , timeout performed,  Correct Patient, Correct Site, Correct Laterality,  Correct Procedure, Correct Position, site marked,  Risks and benefits discussed,  Pre-op evaluation,  At surgeon's request and post-op pain management  Laterality: Left  Prep: Maximum Sterile Barrier Precautions used, chloraprep       Needles:  Injection technique: Single-shot  Needle Type: Echogenic Stimulator Needle     Needle Length: 9cm  Needle Gauge: 22     Additional Needles:   Procedures:,,,, ultrasound used (permanent image in chart),,    Narrative:  Start time: 04/21/2021 12:00 PM End time: 04/21/2021 12:03 PM Injection made incrementally with aspirations every 5 mL.  Performed by: Personally  Anesthesiologist: Brennan Bailey, MD  Additional Notes: Risks, benefits, and alternative discussed. Patient gave consent for procedure. Patient prepped and draped in sterile fashion. Sedation administered, patient remains easily responsive to voice. Relevant anatomy identified with ultrasound guidance. Local anesthetic given in 5cc increments with no signs or symptoms of intravascular injection. No pain or paraesthesias with injection. Patient monitored throughout procedure with signs of LAST or immediate complications. Tolerated well. Ultrasound image placed in chart.  Tawny Asal, MD

## 2021-04-21 NOTE — Transfer of Care (Signed)
Immediate Anesthesia Transfer of Care Note  Patient: Stephen Whitaker  Procedure(s) Performed: LEFT BELOW KNEE AMPUTATION (Left: Knee)  Patient Location: PACU  Anesthesia Type:GA combined with regional for post-op pain  Level of Consciousness: drowsy and patient cooperative  Airway & Oxygen Therapy: Patient Spontanous Breathing and Patient connected to face mask oxygen  Post-op Assessment: Report given to RN and Post -op Vital signs reviewed and stable  Post vital signs: Reviewed and stable  Last Vitals:  Vitals Value Taken Time  BP 121/84 04/21/21 1306  Temp    Pulse 86 04/21/21 1307  Resp 14 04/21/21 1307  SpO2 95 % 04/21/21 1307  Vitals shown include unvalidated device data.  Last Pain:  Vitals:   04/21/21 1155  TempSrc:   PainSc: 0-No pain      Patients Stated Pain Goal: 4 ([id number])  Complications: No notable events documented.

## 2021-04-22 DIAGNOSIS — L89322 Pressure ulcer of left buttock, stage 2: Secondary | ICD-10-CM

## 2021-04-22 LAB — CBC WITH DIFFERENTIAL/PLATELET
Abs Immature Granulocytes: 0.06 10*3/uL (ref 0.00–0.07)
Basophils Absolute: 0 10*3/uL (ref 0.0–0.1)
Basophils Relative: 0 %
Eosinophils Absolute: 0 10*3/uL (ref 0.0–0.5)
Eosinophils Relative: 0 %
HCT: 44.4 % (ref 39.0–52.0)
Hemoglobin: 14.1 g/dL (ref 13.0–17.0)
Immature Granulocytes: 1 %
Lymphocytes Relative: 6 %
Lymphs Abs: 0.5 10*3/uL — ABNORMAL LOW (ref 0.7–4.0)
MCH: 27.3 pg (ref 26.0–34.0)
MCHC: 31.8 g/dL (ref 30.0–36.0)
MCV: 85.9 fL (ref 80.0–100.0)
Monocytes Absolute: 0.3 10*3/uL (ref 0.1–1.0)
Monocytes Relative: 4 %
Neutro Abs: 7.4 10*3/uL (ref 1.7–7.7)
Neutrophils Relative %: 89 %
Platelets: 392 10*3/uL (ref 150–400)
RBC: 5.17 MIL/uL (ref 4.22–5.81)
RDW: 18.1 % — ABNORMAL HIGH (ref 11.5–15.5)
WBC: 8.2 10*3/uL (ref 4.0–10.5)
nRBC: 0 % (ref 0.0–0.2)

## 2021-04-22 LAB — COMPREHENSIVE METABOLIC PANEL
ALT: 23 U/L (ref 0–44)
AST: 41 U/L (ref 15–41)
Albumin: 1.9 g/dL — ABNORMAL LOW (ref 3.5–5.0)
Alkaline Phosphatase: 88 U/L (ref 38–126)
Anion gap: 10 (ref 5–15)
BUN: 39 mg/dL — ABNORMAL HIGH (ref 8–23)
CO2: 25 mmol/L (ref 22–32)
Calcium: 8.3 mg/dL — ABNORMAL LOW (ref 8.9–10.3)
Chloride: 102 mmol/L (ref 98–111)
Creatinine, Ser: 2.05 mg/dL — ABNORMAL HIGH (ref 0.61–1.24)
GFR, Estimated: 35 mL/min — ABNORMAL LOW (ref >60)
Glucose, Bld: 125 mg/dL — ABNORMAL HIGH (ref 70–99)
Potassium: 3.6 mmol/L (ref 3.5–5.1)
Sodium: 137 mmol/L (ref 135–145)
Total Bilirubin: 0.5 mg/dL (ref 0.3–1.2)
Total Protein: 6.3 g/dL — ABNORMAL LOW (ref 6.5–8.1)

## 2021-04-22 LAB — GLUCOSE, CAPILLARY
Glucose-Capillary: 136 mg/dL — ABNORMAL HIGH (ref 70–99)
Glucose-Capillary: 109 mg/dL — ABNORMAL HIGH (ref 70–99)
Glucose-Capillary: 155 mg/dL — ABNORMAL HIGH (ref 70–99)

## 2021-04-22 MED ORDER — TORSEMIDE 20 MG PO TABS
40.0000 mg | ORAL_TABLET | Freq: Every day | ORAL | Status: DC
  Administered 2021-04-22 – 2021-04-23 (×2): 40 mg via ORAL
  Filled 2021-04-22 (×3): qty 2

## 2021-04-22 MED ORDER — SACUBITRIL-VALSARTAN 24-26 MG PO TABS
1.0000 | ORAL_TABLET | Freq: Two times a day (BID) | ORAL | Status: DC
  Administered 2021-04-22 – 2021-05-02 (×21): 1 via ORAL
  Filled 2021-04-22 (×24): qty 1

## 2021-04-22 NOTE — Evaluation (Addendum)
Physical Therapy Evaluation Patient Details Name: Stephen Whitaker MRN: [id number] DOB: 10-Sep-1955 Today's Date: 04/22/2021   History of Present Illness  Stephen Whitaker is a 65 y.o.-year-old male who presented with osteomyelitis abscess and ulceration of the left heel. Underwent Lt transtibial amputation 04/21/21.  Clinical Impression  Pt admitted with above diagnosis. Demonstrates significant weakness. Required total assist for scoot transfer to bed today however very motivated and participated fully. Sat EOB unsupported. Hoyer lift pad left in room per nursing request for transfer back to bed later. Pt currently with functional limitations due to the deficits listed below (see PT Problem List). Pt will benefit from skilled PT to increase their independence and safety with mobility to allow discharge to the venue listed below.       Follow Up Recommendations SNF ([facility] being considered pending progressive tolerance.)    Equipment Recommendations  None recommended by PT (TBD next venue of care)    Recommendations for Other Services       Precautions / Restrictions Precautions Precautions: Fall (Lt BKA) Required Braces or Orthoses: Splint/Cast Splint/Cast: Lt residual limb Restrictions Weight Bearing Restrictions: Yes LLE Weight Bearing: Non weight bearing      Mobility  Bed Mobility Overal bed mobility: Needs Assistance Bed Mobility: Supine to Sit     Supine to sit: Mod assist;HOB elevated     General bed mobility comments: Mod assist for LE support and trunk to rise to EOB, cues to use rail as able. Pulling through therapist hand to rise. Attempted scooting EOB with fair success early however fatigued quickly    Transfers Overall transfer level: Needs assistance Equipment used: None Transfers: Sit to/from Stand;Lateral/Scoot Transfers Sit to Stand: Total assist;From elevated surface        Lateral/Scoot Transfers: Max assist General transfer comment: Attempted to stand  however unsuccessful due to significant weakness. Educated on techniques, hand placement with RW. Performed successful rt lateral scoot to drop-arm chair. Pt initially required mod assist to scoot along bed, however total assist required to rise over small threshold of arm rest in lowered position. Cues for technique, anterior weight shift and Rt knee block to scoot with small dependent squat transfer.  Ambulation/Gait                Stairs            Wheelchair Mobility    Modified Rankin (Stroke Patients Only)       Balance Overall balance assessment: Needs assistance Sitting-balance support: No upper extremity supported Sitting balance-Leahy Scale: Fair Sitting balance - Comments: Sat EOB with Rt foot on floor, working on trunk control, weight shift, and functional tasks with reaching. Intermittent min assist to stabilize.                                     Pertinent Vitals/Pain Pain Assessment: 0-10 Pain Score: 5  Pain Location: Lt residual limb Pain Descriptors / Indicators: Aching Pain Intervention(s): Monitored during session;Limited activity within patient's tolerance;Repositioned    Home Living Family/patient expects to be discharged to:: Unsure Living Arrangements: Alone Available Help at Discharge:  (Very limited family support (they live in [place])) Type of Home: House Home Access: Stairs to enter Entrance Stairs-Rails: None Entrance Stairs-Number of Steps: 5 Home Layout: One level Home Equipment: Cane - single point;Shower seat      Prior Function Level of Independence: Independent with assistive device(s)  Comments: using a shower chair to bathe     Hand Dominance   Dominant Hand: Right    Extremity/Trunk Assessment   Upper Extremity Assessment Upper Extremity Assessment: Defer to OT evaluation    Lower Extremity Assessment Lower Extremity Assessment: LLE deficits/detail;Generalized weakness (Significant  weakness) LLE Deficits / Details: Lt residual limb with splint on    Cervical / Trunk Assessment Cervical / Trunk Assessment: Normal  Communication   Communication: No difficulties  Cognition Arousal/Alertness: Awake/alert Behavior During Therapy: WFL for tasks assessed/performed Overall Cognitive Status: Within Functional Limits for tasks assessed                                        General Comments General comments (skin integrity, edema, etc.): Spoke with RN - have retrieved lift pad for transfer back to bed. Nursing requests not placing under pt at this time.    Exercises Amputee Exercises Quad Sets: Both;5 reps;Seated Gluteal Sets: Strengthening;Both;5 reps;Seated Chair Push Up: Strengthening;Both;5 reps;Seated   Assessment/Plan    PT Assessment Patient needs continued PT services  PT Problem List Decreased strength;Decreased range of motion;Decreased activity tolerance;Decreased balance;Decreased mobility;Decreased knowledge of use of DME;Pain       PT Treatment Interventions DME instruction;Gait training;Functional mobility training;Therapeutic activities;Therapeutic exercise;Balance training;Neuromuscular re-education;Patient/family education;Wheelchair mobility training    PT Goals (Current goals can be found in the Care Plan section)  Acute Rehab PT Goals Patient Stated Goal: Get stronger PT Goal Formulation: With patient Time For Goal Achievement: 05/06/21 Potential to Achieve Goals: Good    Frequency Min 3X/week   Barriers to discharge Inaccessible home environment;Decreased caregiver support lives alone, states inaccessible home    Co-evaluation               AM-PAC PT "6 Clicks" Mobility  Outcome Measure Help needed turning from your back to your side while in a flat bed without using bedrails?: A Lot Help needed moving from lying on your back to sitting on the side of a flat bed without using bedrails?: A Lot Help needed  moving to and from a bed to a chair (including a wheelchair)?: Total Help needed standing up from a chair using your arms (e.g., wheelchair or bedside chair)?: Total Help needed to walk in hospital room?: Total Help needed climbing 3-5 steps with a railing? : Total 6 Click Score: 8    End of Session Equipment Utilized During Treatment: Gait belt Activity Tolerance: Patient tolerated treatment well Patient left: in chair;with call bell/phone within reach;with chair alarm set;with nursing/sitter in room;with SCD's reapplied Nurse Communication: Mobility status;Need for lift equipment (Lift pad in room per RN request) PT Visit Diagnosis: Muscle weakness (generalized) (M62.81);Difficulty in walking, not elsewhere classified (R26.2);Pain Pain - Right/Left: Left Pain - part of body: Leg    Time: [id number] PT Time Calculation (min) (ACUTE ONLY): 59 min   Charges:   PT Evaluation $PT Eval Moderate Complexity: 1 Mod PT Treatments $Therapeutic Exercise: 8-22 mins $Therapeutic Activity: 23-37 mins        Candie Mile, PT, DPT  Ellouise Newer 04/22/2021, 10:51 AM

## 2021-04-22 NOTE — Progress Notes (Addendum)
PROGRESS NOTE    Stephen Whitaker  [id number] DOB: June 24, 1956 DOA: 04/15/2021 PCP: [name], NP    Brief Narrative:  Stephen Whitaker was admitted to the hospital with working diagnosis of left navicular bone osteomyelitis in the setting of Charcot foot.   65 year old male past medical history for COPD, heart failure, type 2 diabetes mellitus and hypertension who presented with left foot infection.  Patient was followed by Dr. Sharol Given for his left Charcot foot ulceration, plans for BKA 8/31st.  At home patient became very weak, to the point where he had to call EMS, he was evaluated and brought to the hospital for further evaluation.  On his initial physical examination his blood pressure was 108/79, heart rate 103, respiratory rate 14, temperature 97.7, oxygenation 100%, his lungs are clear to auscultation bilaterally, heart S1-S2, present, rhythmic, abdomen was soft nontender, his left foot had a posterior medial ulcerated wound with positive drainage.  In the past he had first second and third toes amputated.  Sodium 134, potassium 4.8, chloride 98, bicarb 26, glucose 93, BUN 49, creatinine 2.18, white count 8.9, hemoglobin 14.3, hematocrit 44.9, platelets 422.  BNP > 4500 SARS COVID-19 negative.  Chest radiograph no infiltrates. Left foot radiograph with progressive osteomyelitis of the medial aspect of the navicular bone with overlying soft tissue ulceration.  Chronic advanced arthropathic changes of the hindfoot.  EKG 112 bpm, normal axis, normal intervals, sinus rhythm, Q-wave V1, poor R wave progression, V5-V6 ST segment depression and T wave inversions.  Positive LVH.  Patient was admitted to the medical ward, he was placed on intravenous antibiotic therapy with good toleration.  Patient underwent left transtibial amputation with application of Prevena wound VAC on 9/2.  Assessment & Plan:   Principal Problem:   Osteomyelitis of left foot (HCC) Active Problems:   COLD  (chronic obstructive lung disease) (HCC)   Essential hypertension   HFrEF (heart failure with reduced ejection fraction) (HCC)   Fatigue   Pressure injury of skin   Protein-calorie malnutrition, severe   Left foot navicular bone osteomyelitis in the setting of Charcot foot.  Patient now sp BKA on the left. Continue pain control and DVT prophylaxis. Continued antibiotic therapy for 24 hrs post operative (now discontinue Vancomycin). Follow up with orthopedics, PT and OT recommendations. Patient will need SNF at discharge.  Patient has a wound vac in place.   2. Hypoglycemia fasting glucose today 125, capillary 124, 136 and 109. Patient is tolerating po well. Continue close monitoring of capillary glucose. Consult nutrition for recommendations.   3. Chronic systolic heart failure. Stable heart failure with no clinical signs of decompensation.  Continue metoprolol and plan to resume torsemide plus Entresto.    4. AKI on CKD stage 3 b renal function with serum cr at 2,0 with K at 3,6 and serum bicarbonate at 25. Plan to resume diuresis with torsemide and follow up renal function in am. Avoid hypotension and nephrotoxic medications.    5. COPD no clinical signs of exacerbation. Continue with bronchodilator and dulera.   6. SVT  controlled, continue B blockade with metoprolol.  7. Gout. No acute flare. Continue with allopurinol.   8. Severe protein calorie protein malnutrition/ stage 2 left buttock pressure ulcer (present on admission). Continue with nutritional supplements.  Continue skin care per protocol.   Status is: Inpatient  Remains inpatient appropriate because:Inpatient level of care appropriate due to severity of illness  Dispo: The patient is from: Home  Anticipated d/c is to: SNF              Patient currently is not medically stable to d/c.   Difficult to place patient No  DVT prophylaxis: Enoxaparin   Code Status:    full  Family Communication:   No  family at the bedside      Nutrition Status: Nutrition Problem: Severe Malnutrition Etiology: acute illness (lt foot osteomyelitis) Signs/Symptoms: moderate fat depletion, moderate muscle depletion, edema Interventions: Ensure Enlive (each supplement provides 350kcal and 20 grams of protein), Magic cup, MVI     Skin Documentation: Pressure Injury 04/16/21 Buttocks Left Stage 2 -  Partial thickness loss of dermis presenting as a shallow open injury with a red, pink wound bed without slough. (Active)  04/16/21 2302  Location: Buttocks  Location Orientation: Left  Staging: Stage 2 -  Partial thickness loss of dermis presenting as a shallow open injury with a red, pink wound bed without slough.  Wound Description (Comments):   Present on Admission: Yes     Consultants:  Orthopedic surgery   Procedures:  Left BKA   Antimicrobials:  Vancomycin discontinued     Subjective: Patient with no nausea or vomiting, no dyspnea or chest pain, pain at the surgical site is controlled. He feels very weak and deconditioned not yet back to his baseline.   Objective: Vitals:   04/21/21 2009 04/21/21 2352 04/22/21 0412 04/22/21 0738  BP: 101/79 109/81 107/81 (!) 126/97  Pulse: 92 81 85 92  Resp:  '17 17 18  '$ Temp: 98 F (36.7 C) 97.7 F (36.5 C) 98 F (36.7 C)   TempSrc: Oral Oral Oral   SpO2: 92% 94% 96% 91%  Weight:      Height:        Intake/Output Summary (Last 24 hours) at 04/22/2021 1124 Last data filed at 04/22/2021 0646 Gross per 24 hour  Intake 1516.1 ml  Output 570 ml  Net 946.1 ml   Filed Weights   04/16/21 1958  Weight: 90.7 kg    Examination:   General: Not in pain or dyspnea, deconditioned  Neurology: Awake and alert, non focal  E ENT: no pallor, no icterus, oral mucosa moist Cardiovascular: No JVD. S1-S2 present, rhythmic, no gallops, rubs, or murmurs. No lower extremity edema. Pulmonary: positive breath sounds bilaterally, adequate air movement, no wheezing,  rhonchi or rales. Gastrointestinal. Abdomen soft and non tender Skin. No rashes Musculoskeletal: left BKA with wound vac in place.     Data Reviewed: I have personally reviewed following labs and imaging studies  CBC: Recent Labs  Lab 04/15/21 1714 04/17/21 0414 04/21/21 0024 04/22/21 0047  WBC 8.9 4.7 5.6 8.2  NEUTROABS 7.7  --  4.5 7.4  HGB 14.3 12.8* 12.5* 14.1  HCT 44.9 39.7 39.3 44.4  MCV 84.7 83.6 84.7 85.9  PLT 422* 341 387 0000000   Basic Metabolic Panel: Recent Labs  Lab 04/16/21 0411 04/16/21 1335 04/18/21 0201 04/19/21 0014 04/20/21 0242 04/21/21 0024 04/22/21 0047  NA  --    < > 137 138 139 136 137  K  --    < > 3.6 3.7 3.7 3.4* 3.6  CL  --    < > 104 104 105 103 102  CO2  --    < > '25 26 23 23 25  '$ GLUCOSE  --    < > 91 103* 85 130* 125*  BUN  --    < > 37* 33* 36* 36* 39*  CREATININE  --    < >  1.61* 1.68* 1.54* 1.73* 2.05*  CALCIUM  --    < > 8.3* 8.3* 8.4* 8.2* 8.3*  MG 1.8  --   --   --   --   --   --   PHOS  --   --   --   --   --  3.4  --    < > = values in this interval not displayed.   GFR: Estimated Creatinine Clearance: 41.2 mL/min (A) (by C-G formula based on SCr of 2.05 mg/dL (H)). Liver Function Tests: Recent Labs  Lab 04/18/21 0201 04/20/21 0242 04/21/21 0024 04/22/21 0047  AST 60* 44*  --  41  ALT 36 28  --  23  ALKPHOS 84 83  --  88  BILITOT 1.2 0.9  --  0.5  PROT 6.2* 6.1*  --  6.3*  ALBUMIN 1.9* 1.8* 1.8* 1.9*   No results for input(s): LIPASE, AMYLASE in the last 168 hours. No results for input(s): AMMONIA in the last 168 hours. Coagulation Profile: No results for input(s): INR, PROTIME in the last 168 hours. Cardiac Enzymes: No results for input(s): CKTOTAL, CKMB, CKMBINDEX, TROPONINI in the last 168 hours. BNP (last 3 results) Recent Labs    10/24/20 1517 01/24/21 1106 02/22/21 1158  PROBNP 7,418* 22,081* 20,322*   HbA1C: No results for input(s): HGBA1C in the last 72 hours. CBG: Recent Labs  Lab  04/21/21 1459 04/21/21 1855 04/21/21 2011 04/21/21 2349 04/22/21 0409  GLUCAP 100* 134* 124* 124* 136*   Lipid Profile: No results for input(s): CHOL, HDL, LDLCALC, TRIG, CHOLHDL, LDLDIRECT in the last 72 hours. Thyroid Function Tests: No results for input(s): TSH, T4TOTAL, FREET4, T3FREE, THYROIDAB in the last 72 hours. Anemia Panel: No results for input(s): VITAMINB12, FOLATE, FERRITIN, TIBC, IRON, RETICCTPCT in the last 72 hours.    Radiology Studies: I have reviewed all of the imaging during this hospital visit personally     Scheduled Meds:  (feeding supplement) PROSource Plus  30 mL Oral TID BM   allopurinol  300 mg Oral Daily   vitamin C  1,000 mg Oral Daily   docusate sodium  100 mg Oral Daily   feeding supplement  296 mL Oral Once   fluticasone  1 spray Each Nare BID   lactase  6,000 Units Oral TID WC   loratadine  10 mg Oral Daily   metoprolol succinate  25 mg Oral Daily   mometasone-formoterol  2 puff Inhalation BID   montelukast  10 mg Oral QHS   multivitamin with minerals  1 tablet Oral Daily   mupirocin ointment  1 application Nasal BID   nutrition supplement (JUVEN)  1 packet Oral BID BM   nystatin  5 mL Oral QID   olopatadine  1 drop Both Eyes BID   pantoprazole  40 mg Oral Daily   zinc sulfate  220 mg Oral Daily   Continuous Infusions:  sodium chloride 75 mL/hr at 04/21/21 1723   dextrose 75 mL/hr at 04/20/21 1208   metronidazole 500 mg (04/22/21 0753)     LOS: 6 days        Laquitta Dominski Gerome Apley, MD

## 2021-04-22 NOTE — Progress Notes (Signed)
Patient ID: Stephen Whitaker, male   DOB: 11/24/1955, 65 y.o.   MRN: [id number] Patient has no complaints this morning he states he feels better.  Will plan for discharge to skilled nursing.  The wound VAC has no drainage and has a good suction fit.

## 2021-04-22 NOTE — Evaluation (Signed)
Occupational Therapy Evaluation Patient Details Name: Stephen Whitaker MRN: [id number] DOB: 1955-11-16 Today's Date: 04/22/2021    History of Present Illness Mr. Hanneman is a 65 y.o.-year-old male who presented with osteomyelitis abscess and ulceration of the left heel. Underwent Lt transtibial amputation 04/21/21.   Clinical Impression   Pt admitted for procedure listed above. PTA pt reported that he was independent, however he was reporting increased weakness over the past few weeks, where he would "get stuck in the car or on he toilet". At this time, pt is limited by increased weakness and decreased activity tolerance. He is requiring max A +2 for aall LB ADL's and transfers/functional mobility. Pt will benefit from intensive therapies to maximize progress. OT will follow acutely.     Follow Up Recommendations  SNF;Supervision/Assistance - 24 hour (Will consider CIR if pt progresses with activity tolerance)    Equipment Recommendations  Other (comment) (TBD)    Recommendations for Other Services       Precautions / Restrictions Precautions Precautions: Fall (Lt BKA) Required Braces or Orthoses: Splint/Cast Splint/Cast: Lt residual limb Restrictions Weight Bearing Restrictions: Yes LLE Weight Bearing: Non weight bearing      Mobility Bed Mobility Overal bed mobility: Needs Assistance             General bed mobility comments: Up in recliner on entry    Transfers Overall transfer level: Needs assistance Equipment used: None Transfers: Sit to/from Stand;Lateral/Scoot Transfers Sit to Stand: Total assist;+2 physical assistance;+2 safety/equipment        Lateral/Scoot Transfers: Max assist;+2 physical assistance;+2 safety/equipment General transfer comment: Pt requiring max A +2 for scooting due to increased weakness in BUE and RLE, as well as incoordination with L BKA    Balance Overall balance assessment: Needs assistance Sitting-balance support: No upper  extremity supported Sitting balance-Leahy Scale: Fair Sitting balance - Comments: Sat EOC with Rt foot on floor, working on trunk control, weight shift, and functional tasks with reaching. Intermittent min assist to stabilize.                                   ADL either performed or assessed with clinical judgement   ADL Overall ADL's : Needs assistance/impaired Eating/Feeding: Set up;Sitting   Grooming: Set up;Sitting   Upper Body Bathing: Min guard;Sitting   Lower Body Bathing: Maximal assistance;+2 for physical assistance;+2 for safety/equipment;Bed level;Sitting/lateral leans   Upper Body Dressing : Min guard;Sitting   Lower Body Dressing: Maximal assistance;+2 for physical assistance;+2 for safety/equipment;Sitting/lateral leans;Bed level   Toilet Transfer: Maximal assistance;+2 for physical assistance;+2 for safety/equipment;Transfer board   Toileting- Clothing Manipulation and Hygiene: Maximal assistance;+2 for physical assistance;+2 for safety/equipment;Sitting/lateral lean;Bed level   Tub/ Shower Transfer: Maximal assistance;+2 for physical assistance;+2 for safety/equipment;Transfer board   Functional mobility during ADLs: Maximal assistance;+2 for physical assistance;+2 for safety/equipment General ADL Comments: Pt requiring max A +2 for all LB ADL's and transfers due to increased weakness and incoordination with new residual limb.     Vision Baseline Vision/History: 0 No visual deficits Ability to See in Adequate Light: 0 Adequate Patient Visual Report: No change from baseline Vision Assessment?: No apparent visual deficits     Perception Perception Perception Tested?: No   Praxis Praxis Praxis tested?: Not tested    Pertinent Vitals/Pain Pain Assessment: 0-10 Pain Score: 5  Pain Location: Lt residual limb Pain Descriptors / Indicators: Aching Pain Intervention(s): Limited activity within patient's tolerance;Monitored  during  session;Repositioned     Hand Dominance Right   Extremity/Trunk Assessment Upper Extremity Assessment Upper Extremity Assessment: Generalized weakness   Lower Extremity Assessment Lower Extremity Assessment: Defer to PT evaluation LLE Deficits / Details: Lt residual limb with splint on   Cervical / Trunk Assessment Cervical / Trunk Assessment: Normal   Communication Communication Communication: No difficulties   Cognition Arousal/Alertness: Awake/alert Behavior During Therapy: WFL for tasks assessed/performed Overall Cognitive Status: Within Functional Limits for tasks assessed                                     General Comments  Reviewed lateral scoots with nursing staff as well as using maximove to lift pt back to bed when ready    Exercises Exercises: Other exercises Other Exercises Other Exercises: Isometric pushing/pulling with increased resistance from OT, x5 reps BUE Other Exercises: Chair pushups x5   Shoulder Instructions      Home Living Family/patient expects to be discharged to:: Unsure Living Arrangements: Alone Available Help at Discharge:  (Very limited family support (they live in [place])) Type of Home: House Home Access: Stairs to enter [employer] of Steps: 5 Entrance Stairs-Rails: None Home Layout: One level     Bathroom Shower/Tub: Tub/shower unit;Curtain     Bathroom Accessibility: No   Home Equipment: Cane - single point;Shower seat   Additional Comments: Pt reporting that he is being evicted from his room he rents from his landlord      Prior Functioning/Environment Level of Independence: Independent with assistive device(s)        Comments: using a shower chair to bathe and a RW at times for ambulation        OT Problem List: Decreased strength;Decreased activity tolerance;Impaired balance (sitting and/or standing);Decreased coordination;Decreased safety awareness;Decreased knowledge of use of DME  or AE;Impaired sensation;Pain      OT Treatment/Interventions: Self-care/ADL training;Therapeutic exercise;Energy conservation;DME and/or AE instruction;Therapeutic activities;Patient/family education;Balance training    OT Goals(Current goals can be found in the care plan section) Acute Rehab OT Goals Patient Stated Goal: Get stronger OT Goal Formulation: With patient Time For Goal Achievement: 05/06/21 Potential to Achieve Goals: Good ADL Goals Pt Will Perform Lower Body Bathing: with mod assist;sitting/lateral leans;bed level Pt Will Perform Lower Body Dressing: with mod assist;sitting/lateral leans Pt Will Transfer to Toilet: with mod assist;with transfer board Pt Will Perform Toileting - Clothing Manipulation and hygiene: with mod assist;sitting/lateral leans;bed level Additional ADL Goal #1: Pt will tolerate sittin gEOB unsupported for 5 mins, maintaining midline  OT Frequency: Min 2X/week   Barriers to D/C:    Pt reports that he is being evicted from his home, as well as the fact that his family lives in [place] and works full time, unable to provide increased support       Co-evaluation              AM-PAC OT "6 Clicks" Daily Activity     Outcome Measure Help from another person eating meals?: A Little Help from another person taking care of personal grooming?: A Little Help from another person toileting, which includes using toliet, bedpan, or urinal?: A Lot Help from another person bathing (including washing, rinsing, drying)?: A Lot Help from another person to put on and taking off regular upper body clothing?: A Little Help from another person to put on and taking off regular lower body clothing?: A Lot 6 Click  Score: 15   End of Session Nurse Communication: Mobility status;Other (comment) (Pt wanting nursing staff to assist with a bath before transferring him back to bed.)  Activity Tolerance: Patient tolerated treatment well Patient left: in chair;with call  bell/phone within reach;with chair alarm set  OT Visit Diagnosis: Unsteadiness on feet (R26.81);Other abnormalities of gait and mobility (R26.89);Muscle weakness (generalized) (M62.81);Pain Pain - Right/Left: Left Pain - part of body: Leg                Time: 1530-1555 OT Time Calculation (min): 25 min Charges:  OT General Charges $OT Visit: 1 Visit OT Evaluation $OT Eval Moderate Complexity: 1 Mod OT Treatments $Therapeutic Activity: 8-22 mins  Lunah Losasso H., OTR/L Acute Rehabilitation  Floride Hutmacher Elane Yolanda Bonine 04/22/2021, 8:31 PM

## 2021-04-23 LAB — BASIC METABOLIC PANEL
Anion gap: 12 (ref 5–15)
BUN: 48 mg/dL — ABNORMAL HIGH (ref 8–23)
CO2: 23 mmol/L (ref 22–32)
Calcium: 8.3 mg/dL — ABNORMAL LOW (ref 8.9–10.3)
Chloride: 101 mmol/L (ref 98–111)
Creatinine, Ser: 2.22 mg/dL — ABNORMAL HIGH (ref 0.61–1.24)
GFR, Estimated: 32 mL/min — ABNORMAL LOW (ref >60)
Glucose, Bld: 111 mg/dL — ABNORMAL HIGH (ref 70–99)
Potassium: 3.4 mmol/L — ABNORMAL LOW (ref 3.5–5.1)
Sodium: 136 mmol/L (ref 135–145)

## 2021-04-23 LAB — GLUCOSE, CAPILLARY
Glucose-Capillary: 101 mg/dL — ABNORMAL HIGH (ref 70–99)
Glucose-Capillary: 113 mg/dL — ABNORMAL HIGH (ref 70–99)
Glucose-Capillary: 65 mg/dL — ABNORMAL LOW (ref 70–99)
Glucose-Capillary: 69 mg/dL — ABNORMAL LOW (ref 70–99)
Glucose-Capillary: 87 mg/dL (ref 70–99)
Glucose-Capillary: 88 mg/dL (ref 70–99)

## 2021-04-23 LAB — CBC
HCT: 43.2 % (ref 39.0–52.0)
Hemoglobin: 13.8 g/dL (ref 13.0–17.0)
MCH: 27.2 pg (ref 26.0–34.0)
MCHC: 31.9 g/dL (ref 30.0–36.0)
MCV: 85 fL (ref 80.0–100.0)
Platelets: 418 10*3/uL — ABNORMAL HIGH (ref 150–400)
RBC: 5.08 MIL/uL (ref 4.22–5.81)
RDW: 17.8 % — ABNORMAL HIGH (ref 11.5–15.5)
WBC: 9.9 10*3/uL (ref 4.0–10.5)
nRBC: 0.2 % (ref 0.0–0.2)

## 2021-04-23 NOTE — Progress Notes (Signed)
PROGRESS NOTE   Stephen Whitaker  [id number] DOB: [birth year] DOA: 04/15/2021 PCP: [name], NP  Brief Narrative:   65 year old black male community dwelling known COPD, systolic CHF EF 0000000, DM TY 2, HTN, CKD 3B, SVT atrial tachycardia, gout, severe malnutrition Follows Dr. Sharol Given 2/2 to chronic left Charcot foot ulceration-been on doxycycline--scheduled to have BKA 04/19/2021 Started on Rocephin Flagyl vancomycin on admission-ABIs ordered on admission found to have mild AKI, thrush and started on nystatin Cardiology noted moderate risk for surgery but cleared for the same  Awaiting amputation which had to be postponed until 04/21/2021 at patient request  Hospital-Problem based course  Left Charcot foot ulceration status post Continue pain control Norco 1-2 tab every 4 as needed moderate pain, morphine 2 mg every 2 as needed severe pain--adjust pain meds as needed over the next several days Perioperative antibiotics have been completed Therapy is recommending CIR stay-the patient tells me he was recently evicted-TOC to follow-up Severe hypoglycemia secondary to poor p.o. intake 9/1 Blood sugars improved going up to the 150s despite only eating 25% of meals Watch for hypoglycemia HFrEF chronic without any acute component Entresto restarted 04/22/2021, continue Toprol-XL 25 daily with as needed's for pressure AKI superimposed on CKD 3B baseline 1.2 Meds adjusted as above--- hold off on Demadex going forward, monitor trends of creatinine over the next 24 hours Chronic gout Continue allopurinol 300 daily COPD without exacerbation Continue Dulera 2 puffs twice daily, Singulair 10 at bedtime, albuterol every 4 as needed for wheeze Oropharyngeal thrush Continue nystatin 4 times daily started on 8/28, finish at least 10 days in the outpatient setting SVT atrial tachycardia in the past Continues on beta-blocker    DVT prophylaxis: SCD Code Status: Full Family Communication: None  at bedside Disposition:  Status is: Inpatient  Remains inpatient appropriate because:Hemodynamically unstable, Unsafe d/c plan, IV treatments appropriate due to intensity of illness or inability to take PO, and Inpatient level of care appropriate due to severity of illness  Dispo: The patient is from: Home              Anticipated d/c is to: SNF              Patient currently is not medically stable to d/c.   Difficult to place patient No       Consultants:  Orthopedics  Procedures:   Antimicrobials: As above   Subjective:  Awake coherent pain is moderately controlled on orals needed 1 dose of IV morphine No chest pain no cough no fever no chills no nausea no vomiting  Objective: Vitals:   04/22/21 2230 04/23/21 0337 04/23/21 0800 04/23/21 0846  BP: 105/80 117/80 (!) 114/96   Pulse: 78 92 (!) 110   Resp: '17 18 18   '$ Temp: 98.7 F (37.1 C) 98.7 F (37.1 C) (!) 97.5 F (36.4 C)   TempSrc: Oral Oral Oral   SpO2: 95% 95% 95% 95%  Weight:      Height:        Intake/Output Summary (Last 24 hours) at 04/23/2021 1110 Last data filed at 04/22/2021 2236 Gross per 24 hour  Intake --  Output 500 ml  Net -500 ml    Filed Weights   04/16/21 1958  Weight: 90.7 kg    Examination:  Coherent pleasant no distress EOMI NCAT no focal deficit Neck soft supple Chest clear no rales rhonchi Abdomen soft ROM intact Left lower extremity has brace in place Neurologically grossly intact  Data Reviewed: personally  reviewed   CBC    Component Value Date/Time   WBC 9.9 04/23/2021 0215   RBC 5.08 04/23/2021 0215   HGB 13.8 04/23/2021 0215   HGB 11.6 (L) 12/08/2020 1636   HGB 12.4 (L) 08/08/2010 1010   HCT 43.2 04/23/2021 0215   HCT 36.7 (L) 12/08/2020 1636   HCT 37.5 (L) 08/08/2010 1010   PLT 418 (H) 04/23/2021 0215   PLT 275 12/08/2020 1636   MCV 85.0 04/23/2021 0215   MCV 84 12/08/2020 1636   MCV 84.3 08/08/2010 1010   MCH 27.2 04/23/2021 0215   MCHC 31.9 04/23/2021  0215   RDW 17.8 (H) 04/23/2021 0215   RDW 13.7 12/08/2020 1636   RDW 13.9 08/08/2010 1010   LYMPHSABS 0.5 (L) 04/22/2021 0047   LYMPHSABS 0.9 06/26/2019 1035   LYMPHSABS 1.0 08/08/2010 1010   MONOABS 0.3 04/22/2021 0047   MONOABS 0.5 08/08/2010 1010   EOSABS 0.0 04/22/2021 0047   EOSABS 0.1 06/26/2019 1035   BASOSABS 0.0 04/22/2021 0047   BASOSABS 0.0 06/26/2019 1035   BASOSABS 0.0 08/08/2010 1010   CMP Latest Ref Rng & Units 04/23/2021 04/22/2021 04/21/2021  Glucose 70 - 99 mg/dL 111(H) 125(H) 130(H)  BUN 8 - 23 mg/dL 48(H) 39(H) 36(H)  Creatinine 0.61 - 1.24 mg/dL 2.22(H) 2.05(H) 1.73(H)  Sodium 135 - 145 mmol/L 136 137 136  Potassium 3.5 - 5.1 mmol/L 3.4(L) 3.6 3.4(L)  Chloride 98 - 111 mmol/L 101 102 103  CO2 22 - 32 mmol/L '23 25 23  '$ Calcium 8.9 - 10.3 mg/dL 8.3(L) 8.3(L) 8.2(L)  Total Protein 6.5 - 8.1 g/dL - 6.3(L) -  Total Bilirubin 0.3 - 1.2 mg/dL - 0.5 -  Alkaline Phos 38 - 126 U/L - 88 -  AST 15 - 41 U/L - 41 -  ALT 0 - 44 U/L - 23 -     Radiology Studies: No results found.   Scheduled Meds:  (feeding supplement) PROSource Plus  30 mL Oral TID BM   allopurinol  300 mg Oral Daily   vitamin C  1,000 mg Oral Daily   docusate sodium  100 mg Oral Daily   feeding supplement  296 mL Oral Once   fluticasone  1 spray Each Nare BID   lactase  6,000 Units Oral TID WC   loratadine  10 mg Oral Daily   metoprolol succinate  25 mg Oral Daily   mometasone-formoterol  2 puff Inhalation BID   montelukast  10 mg Oral QHS   multivitamin with minerals  1 tablet Oral Daily   mupirocin ointment  1 application Nasal BID   nutrition supplement (JUVEN)  1 packet Oral BID BM   nystatin  5 mL Oral QID   olopatadine  1 drop Both Eyes BID   pantoprazole  40 mg Oral Daily   sacubitril-valsartan  1 tablet Oral BID   torsemide  40 mg Oral Daily   zinc sulfate  220 mg Oral Daily   Continuous Infusions:     LOS: 7 days   Time spent: 22  Nita Sells, MD [facility] To contact the attending provider between 7A-7P or the covering provider during after hours 7P-7A, please log into the web site [email or web address] and access using universal [place] password for that web site. If you do not have the password, please call the hospital operator.  04/23/2021, 11:10 AM

## 2021-04-23 NOTE — Progress Notes (Addendum)
Patient ID: Stephen Whitaker, male   DOB: [birth year], 65 y.o.   MRN: [id number] Left BKA post op day #4. Left immediate post op knee extension orthosis, training for prosthesis. VAC intact with no drainage. Apparently being considered for CIR.  Orthopaedically stable.

## 2021-04-23 NOTE — Progress Notes (Signed)
Inpatient Rehab Admissions Coordinator:   CIR consult received. Pt. Is currently total assist with PT/OT recs for SNF. If Pt. Is able to increase tolerance and participation, he may progress toward being an appropriate candidate for CIR. I will follow for progress and participation with therapies.   Clemens Catholic, [place], [place] Admissions Coordinator  [phone] ([place]) [phone] (office)

## 2021-04-24 ENCOUNTER — Encounter (HOSPITAL_COMMUNITY): Payer: Self-pay | Admitting: Orthopedic Surgery

## 2021-04-24 LAB — GLUCOSE, CAPILLARY
Glucose-Capillary: 125 mg/dL — ABNORMAL HIGH (ref 70–99)
Glucose-Capillary: 40 mg/dL — CL (ref 70–99)
Glucose-Capillary: 59 mg/dL — ABNORMAL LOW (ref 70–99)
Glucose-Capillary: 82 mg/dL (ref 70–99)
Glucose-Capillary: 82 mg/dL (ref 70–99)
Glucose-Capillary: 89 mg/dL (ref 70–99)
Glucose-Capillary: 91 mg/dL (ref 70–99)
Glucose-Capillary: 95 mg/dL (ref 70–99)

## 2021-04-24 MED ORDER — CHLORHEXIDINE GLUCONATE CLOTH 2 % EX PADS
6.0000 | MEDICATED_PAD | Freq: Every day | CUTANEOUS | Status: DC
  Administered 2021-04-24 – 2021-05-02 (×7): 6 via TOPICAL

## 2021-04-24 MED ORDER — DEXTROSE 50 % IV SOLN
INTRAVENOUS | Status: AC
  Administered 2021-04-24: 50 mL
  Filled 2021-04-24: qty 50

## 2021-04-24 MED ORDER — TAMSULOSIN HCL 0.4 MG PO CAPS
0.4000 mg | ORAL_CAPSULE | Freq: Every day | ORAL | Status: DC
  Administered 2021-04-24 – 2021-05-01 (×7): 0.4 mg via ORAL
  Filled 2021-04-24 (×9): qty 1

## 2021-04-24 NOTE — Progress Notes (Signed)
Pt feeling like he needs to void but unable to urinate. Bladder scanned with greater than 900cc noted on scanner. Order given by MD for in and out cath once. Extracted 1000cc via in and out catheterization

## 2021-04-24 NOTE — NC FL2 (Signed)
[place] LEVEL OF CARE SCREENING TOOL     IDENTIFICATION  Patient Name: Stephen Whitaker Birthdate: [birth year] Sex: male Admission Date (Current Location): 04/15/2021  [facility] and [place] Number:  Herbalist and Address:  [facility], [place] [address] [place], [place] [address]      Provider Number: [id number]  Attending Physician Name and Address:  Nita Sells, MD  Relative Name and Phone Number:       Current Level of Care: Hospital Recommended Level of Care: [place] Prior Approval Number:    Date Approved/Denied:   PASRR Number:    Discharge Plan: SNF    Current Diagnoses: Patient Active Problem List   Diagnosis Date Noted   Pressure injury of skin 04/17/2021   Protein-calorie malnutrition, severe 04/17/2021   Osteomyelitis of left foot ([place]) 04/16/2021   Diabetic foot infection ([place])    Fatigue 02/22/2021   Nonadherence to medication 02/15/2021   Asthma 01/30/2021   Other adverse food reactions, not elsewhere classified, subsequent encounter 01/30/2021   LVH (left ventricular hypertrophy) 01/04/2021   Acute on chronic systolic heart failure ([place]) 12/08/2020   HFrEF (heart failure with reduced ejection fraction) ([place]) 10/14/2020   Nonspecific abnormal electrocardiogram (ECG) (EKG) 10/14/2020   Atrial tachycardia ([place]) 10/14/2020   Non-pressure chronic ulcer of other part of left foot limited to breakdown of skin ([place]) 10/12/2020   Leg swelling 10/06/2020   DOE (dyspnea on exertion) 10/06/2020   Thrombocytopenia ([place]) 06/26/2019   Acquired absence of other right toe(s) ([place]) 02/19/2018   Boil of buttock 10/24/2016   Amputated toe of left foot ([place]) 10/19/2016   Amputated toe, right ([place]) 06/13/2016   Chronic bronchitis ([place]) 11/24/2015   Other pancytopenia ([place]) 01/24/2015   Chronic pancreatitis ([place]) 01/24/2015   Blood poisoning    Neutropenia (HCC)    Iron deficiency anemia due to  chronic blood loss    Leukopenia    Sepsis ([place]) 01/21/2015   Leucopenia 01/21/2015   Anemia 01/21/2015   Syncope 01/21/2015   Diarrhea 01/21/2015   Hypokalemia 01/21/2015   Loss of consciousness ([place])    Eczema 10/21/2014   Essential hypertension 10/21/2014   Vasovagal syncope 10/21/2014   Allergic conjunctivitis of both eyes 08/10/2014   Other chronic pancreatitis ([place]) 08/10/2014   COLD (chronic obstructive lung disease) ([place]) 08/10/2014   Chronic ethmoidal sinusitis 08/10/2014   Bronchitis 10/17/2010   PLANTAR FASCIITIS, LEFT 10/17/2010   OTHER NEUTROPENIA 04/21/2010   CALLUS, TOE 03/15/2010   LEUKOPENIA, MILD 02/20/2010   LIVER FUNCTION TESTS, ABNORMAL, HX OF 02/16/2010   DENTAL CARIES 02/15/2010   ONYCHOMYCOSIS 07/05/2009   ALLERGIC URTICARIA 07/05/2009   PAIN IN LIMB 07/05/2009   ERECTILE DYSFUNCTION 05/25/2008   INFLUENZA 05/25/2008   VIRAL INFECTION 10/01/2007   HSV 05/02/2007   CONDYLOMA ACUMINATUM 05/02/2007   HYPERTENSION 05/02/2007   Other allergic rhinitis 05/02/2007   Chronic asthma, mild persistent, uncomplicated Q000111Q   Gastroesophageal reflux disease without esophagitis 05/02/2007   PANCREATITIS 05/02/2007    Orientation RESPIRATION BLADDER Height & Weight     Self, Time, Situation, Place  Normal Continent Weight: 200 lb (90.7 kg) Height:  '6\' 2"'$  (188 cm)  BEHAVIORAL SYMPTOMS/MOOD NEUROLOGICAL BOWEL NUTRITION STATUS      Continent Diet (heart healthy)  AMBULATORY STATUS COMMUNICATION OF NEEDS Skin   Extensive Assist Verbally PU Stage and Appropriate Care, Surgical wounds, Wound Vac (closed left leg, Prevena wound vac until 04/28/21)   PU Stage 2  Dressing:  (left buttocks, foam dressing: lift every shift to assess, change every 3 days)                   Personal Care Assistance Level of Assistance  Bathing, Feeding, Dressing Bathing Assistance: Limited assistance Feeding assistance: Independent Dressing Assistance: Limited assistance      Functional Limitations Info             [place]  PT (By licensed PT), OT (By licensed OT)     PT Frequency: 5x/wk OT Frequency: 5x/wk            Contractures Contractures Info: Not present    Additional Factors Info  Code Status, Allergies Code Status Info: Full Allergies Info: Ciprofloxacin, Shellfish Allergy, Sulfa Antibiotics           Current Medications (04/24/2021):  This is the current hospital active medication list Current Facility-Administered Medications  Medication Dose Route Frequency Provider Last Rate Last Admin   (feeding supplement) PROSource Plus liquid 30 mL  30 mL Oral TID BM Persons, Bevely Palmer, PA   30 mL at 04/24/21 [id number]   acetaminophen (TYLENOL) tablet 650 mg  650 mg Oral Q6H PRN Persons, Bevely Palmer, PA       Or   acetaminophen (TYLENOL) suppository 650 mg  650 mg Rectal Q6H PRN Persons, Bevely Palmer, PA       albuterol (PROVENTIL) (2.5 MG/3ML) 0.083% nebulizer solution 2.5 mg  2.5 mg Inhalation Q4H PRN Persons, Bevely Palmer, PA       allopurinol (ZYLOPRIM) tablet 300 mg  300 mg Oral Daily Persons, Bevely Palmer, PA   300 mg at 04/24/21 0927   alum & mag hydroxide-simeth (MAALOX/MYLANTA) 200-200-20 MG/5ML suspension 15-30 mL  15-30 mL Oral Q2H PRN Persons, Bevely Palmer, PA       ascorbic acid (VITAMIN C) tablet 1,000 mg  1,000 mg Oral Daily Persons, Bevely Palmer, PA   1,000 mg at 04/24/21 [id number]   bisacodyl (DULCOLAX) EC tablet 5 mg  5 mg Oral Daily PRN Persons, Bevely Palmer, PA       docusate sodium (COLACE) capsule 100 mg  100 mg Oral Daily Persons, Bevely Palmer, PA   100 mg at 04/24/21 [id number]   feeding supplement (ENSURE PRE-SURGERY) liquid 296 mL  296 mL Oral Once Persons, Bevely Palmer, PA       fluticasone (FLONASE) 50 MCG/ACT nasal spray 1 spray  1 spray Each Nare BID Persons, Bevely Palmer, PA   1 spray at 04/24/21 0934   guaiFENesin-dextromethorphan (ROBITUSSIN DM) 100-10 MG/5ML syrup 15 mL  15 mL Oral Q4H PRN Persons, Bevely Palmer, PA       hydrALAZINE  (APRESOLINE) injection 5 mg  5 mg Intravenous Q20 Min PRN Persons, Bevely Palmer, PA       HYDROcodone-acetaminophen (NORCO/VICODIN) 5-325 MG per tablet 1-2 tablet  1-2 tablet Oral Q4H PRN Persons, Bevely Palmer, [place]   2 tablet at 04/23/21 1812   labetalol (NORMODYNE) injection 10 mg  10 mg Intravenous Q10 min PRN Persons, Bevely Palmer, PA       lactase (LACTAID) tablet 6,000 Units  6,000 Units Oral TID WC Persons, Bevely Palmer, PA   6,000 Units at 04/24/21 1214   loratadine (CLARITIN) tablet 10 mg  10 mg Oral Daily Persons, Bevely Palmer, PA   10 mg at 04/24/21 [id number]   magnesium citrate solution 1 Bottle  1 Bottle Oral Once PRN Persons, Bevely Palmer, PA  metoprolol succinate (TOPROL-XL) 24 hr tablet 25 mg  25 mg Oral Daily Persons, Bevely Palmer, PA   25 mg at 04/24/21 0926   mometasone-formoterol (DULERA) 200-5 MCG/ACT inhaler 2 puff  2 puff Inhalation BID Persons, Bevely Palmer, PA   2 puff at 04/23/21 1959   montelukast (SINGULAIR) tablet 10 mg  10 mg Oral QHS Persons, Bevely Palmer, [place]   10 mg at 04/23/21 2104   morphine 2 MG/ML injection 2 mg  2 mg Intravenous Q2H PRN Persons, Bevely Palmer, PA   2 mg at 04/24/21 0124   multivitamin with minerals tablet 1 tablet  1 tablet Oral Daily Persons, Bevely Palmer, PA   1 tablet at 04/24/21 [id number]   nutrition supplement (JUVEN) (JUVEN) powder packet 1 packet  1 packet Oral BID BM Persons, Bevely Palmer, [place]   1 packet at 04/24/21 [id number]   nystatin (MYCOSTATIN) 100000 UNIT/ML suspension 500,000 Units  5 mL Oral QID Persons, Bevely Palmer, PA   500,000 Units at 04/24/21 0926   olopatadine (PATANOL) 0.1 % ophthalmic solution 1 drop  1 drop Both Eyes BID Persons, Bevely Palmer, PA   1 drop at 04/24/21 0933   ondansetron (ZOFRAN) tablet 4 mg  4 mg Oral Q6H PRN Persons, Bevely Palmer, PA       Or   ondansetron [facility]) injection 4 mg  4 mg Intravenous Q6H PRN Persons, Bevely Palmer, PA   4 mg at 04/23/21 1243   pantoprazole (PROTONIX) EC tablet 40 mg  40 mg Oral Daily Persons, Bevely Palmer, PA   40 mg at 04/24/21 0926    phenol (CHLORASEPTIC) mouth spray 1 spray  1 spray Mouth/Throat PRN Persons, Bevely Palmer, PA       polyethylene glycol (MIRALAX / GLYCOLAX) packet 17 g  17 g Oral Daily PRN Persons, Bevely Palmer, PA       sacubitril-valsartan (ENTRESTO) 24-26 mg per tablet  1 tablet Oral BID Arrien, Jimmy Picket, MD   1 tablet at 04/24/21 [id number]   zinc sulfate capsule 220 mg  220 mg Oral Daily Persons, Bevely Palmer, [place]   220 mg at 04/24/21 [id number]     Discharge Medications: Please see discharge summary for a list of discharge medications.  Relevant Imaging Results:  Relevant Lab Results:   Additional Information SS#: [id number]  Geralynn Ochs, LCSW

## 2021-04-24 NOTE — TOC Initial Note (Signed)
Transition of Care [facility]) - Initial/Assessment Note    Patient Details  Name: Stephen Whitaker MRN: [id number] Date of Birth: 09-23-1955  [facility]) CM/SW Contact:    Geralynn Ochs, LCSW Phone Number: 04/24/2021, 12:37 PM  Clinical Narrative:        Patient doesn't feel like he can tolerate intensity of CIR, asking for SNF. CSW to fax out referral and will follow up with bed offers.           Expected Discharge Plan: Skilled Nursing Facility Barriers to Discharge: Continued Medical Work up, Ship broker   Patient Goals and CMS Choice Patient states their goals for this hospitalization and ongoing recovery are:: to get better CMS Medicare.gov Compare Post Acute Care list provided to:: Patient Choice offered to / list presented to : Patient  Expected Discharge Plan and Services Expected Discharge Plan: [place] Choice: [place] Living arrangements for the past 2 months: Apartment                                      Prior Living Arrangements/Services Living arrangements for the past 2 months: Apartment Lives with:: Self Patient language and need for interpreter reviewed:: No Do you feel safe going back to the place where you live?: Yes      Need for Family Participation in Patient Care: No (Comment) Care giver support system in place?: No (comment)   Criminal Activity/Legal Involvement Pertinent to Current Situation/Hospitalization: No - Comment as needed  Activities of Daily Living Home Assistive Devices/Equipment: None ADL Screening (condition at time of admission) Patient's cognitive ability adequate to safely complete daily activities?: Yes Is the patient deaf or have difficulty hearing?: No Does the patient have difficulty seeing, even when wearing glasses/contacts?: No Does the patient have difficulty concentrating, remembering, or making decisions?: No Patient able to  express need for assistance with ADLs?: Yes Does the patient have difficulty dressing or bathing?: Yes Independently performs ADLs?: Yes (appropriate for developmental age) Does the patient have difficulty walking or climbing stairs?: Yes Weakness of Legs: Right Weakness of Arms/Hands: Right  Permission Sought/Granted Permission sought to share information with : Facility Art therapist granted to share information with : Yes, Verbal Permission Granted     Permission granted to share info w AGENCY: SNF        Emotional Assessment Appearance:: Appears stated age Attitude/Demeanor/Rapport: Engaged Affect (typically observed): Appropriate Orientation: : Oriented to Self, Oriented to Place, Oriented to  Time, Oriented to Situation Alcohol / Substance Use: Not Applicable Psych Involvement: No (comment)  Admission diagnosis:  Infection [B99.9] Osteomyelitis of left foot (HCC) [M86.9] Diabetic foot infection ([place]) [id number], L08.9] Osteomyelitis of ankle or foot, acute, left ([place]) [id number] Patient Active Problem List   Diagnosis Date Noted   Pressure injury of skin 04/17/2021   Protein-calorie malnutrition, severe 04/17/2021   Osteomyelitis of left foot ([place]) 04/16/2021   Diabetic foot infection ([place])    Fatigue 02/22/2021   Nonadherence to medication 02/15/2021   Asthma 01/30/2021   Other adverse food reactions, not elsewhere classified, subsequent encounter 01/30/2021   LVH (left ventricular hypertrophy) 01/04/2021   Acute on chronic systolic heart failure ([place]) 12/08/2020   HFrEF (heart failure with reduced ejection fraction) ([place]) 10/14/2020   Nonspecific abnormal electrocardiogram (ECG) (EKG) 10/14/2020   Atrial tachycardia ([place]) 10/14/2020   Non-pressure chronic  ulcer of other part of left foot limited to breakdown of skin ([place]) 10/12/2020   Leg swelling 10/06/2020   DOE (dyspnea on exertion) 10/06/2020   Thrombocytopenia ([place]) 06/26/2019   Acquired  absence of other right toe(s) ([place]) 02/19/2018   Boil of buttock 10/24/2016   Amputated toe of left foot ([place]) 10/19/2016   Amputated toe, right ([place]) 06/13/2016   Chronic bronchitis ([place]) 11/24/2015   Other pancytopenia ([place]) 01/24/2015   Chronic pancreatitis ([place]) 01/24/2015   Blood poisoning    Neutropenia (HCC)    Iron deficiency anemia due to chronic blood loss    Leukopenia    Sepsis ([place]) 01/21/2015   Leucopenia 01/21/2015   Anemia 01/21/2015   Syncope 01/21/2015   Diarrhea 01/21/2015   Hypokalemia 01/21/2015   Loss of consciousness ([place])    Eczema 10/21/2014   Essential hypertension 10/21/2014   Vasovagal syncope 10/21/2014   Allergic conjunctivitis of both eyes 08/10/2014   Other chronic pancreatitis ([place]) 08/10/2014   COLD (chronic obstructive lung disease) ([place]) 08/10/2014   Chronic ethmoidal sinusitis 08/10/2014   Bronchitis 10/17/2010   PLANTAR FASCIITIS, LEFT 10/17/2010   OTHER NEUTROPENIA 04/21/2010   CALLUS, TOE 03/15/2010   LEUKOPENIA, MILD 02/20/2010   LIVER FUNCTION TESTS, ABNORMAL, HX OF 02/16/2010   DENTAL CARIES 02/15/2010   ONYCHOMYCOSIS 07/05/2009   ALLERGIC URTICARIA 07/05/2009   PAIN IN LIMB 07/05/2009   ERECTILE DYSFUNCTION 05/25/2008   INFLUENZA 05/25/2008   VIRAL INFECTION 10/01/2007   HSV 05/02/2007   CONDYLOMA ACUMINATUM 05/02/2007   HYPERTENSION 05/02/2007   Other allergic rhinitis 05/02/2007   Chronic asthma, mild persistent, uncomplicated Q000111Q   Gastroesophageal reflux disease without esophagitis 05/02/2007   PANCREATITIS 05/02/2007   PCP:  [name], NP Pharmacy:   [place], [address] [place] [address] Phone: [phone] Fax: [phone]  [facility] [place], [place] [address] [place] [address] Phone: [phone] Fax: [phone]  [facility] (New Address) - [place], [place] AT Previously: [place], [place] [address] [place] [address] Phone: [phone] Fax: [place], [address] AT [place] [address] Phone: [phone] Fax: [phone]     Social Determinants of Health (SDOH) Interventions Food Insecurity Interventions: Intervention Not Indicated Financial Strain Interventions: Other (Comment) (CSW consulted) Housing Interventions: Other (Comment) (air ducts have mold per pt statement.) Transportation Interventions: Intervention Not Indicated  Readmission Risk Interventions No flowsheet data found.

## 2021-04-24 NOTE — Progress Notes (Signed)
Occupational Therapy Treatment Patient Details Name: BEAMON MASSIAH MRN: [id number] DOB: 10/25/1955 Today's Date: 04/24/2021    History of present illness Mr. Tainter is a 65 y.o.-year-old male who presented with osteomyelitis abscess and ulceration of the left heel. Underwent Lt transtibial amputation 04/21/21 with wound vac (admitted 04/15/21), post op having difficulties with hypoglycemia.  Pt with significant PMH of COPD, gout, HTN, mild mitral regurgitation, R 2nd toe amputation, multiple L LE amputations, CKD 3B, SVT.   OT comments  Wille Glaser is progressing well, this session was limited by his distended bladder which was very painful with position changes. RN and NT aware. He was able to bathe at chair level with lateral leans given up to mod A for rear peri care. Transfer deferred this session due to distended bladder and pain with forward flexion. Pt continues to benefit from continue OT acutely. Recommend d/c to CIR for intensive therapies to progress his functional levels.    Follow Up Recommendations  Supervision/Assistance - 24 hour;CIR    Equipment Recommendations  Other (comment)       Precautions / Restrictions Precautions Precautions: Fall Required Braces or Orthoses: Splint/Cast Splint/Cast: L limb protector, L wound vac Restrictions Weight Bearing Restrictions: Yes LLE Weight Bearing: Non weight bearing       Mobility Bed Mobility Overal bed mobility: Needs Assistance Bed Mobility: Supine to Sit     Supine to sit: Mod assist;HOB elevated     General bed mobility comments: pt in chair upon arrival    Transfers Overall transfer level: Needs assistance Equipment used: None Transfers: Lateral/Scoot Transfers          Lateral/Scoot Transfers: Mod assist;From elevated surface General transfer comment: session completed in chair focused on chair level ADL techniques    Balance Overall balance assessment: Needs assistance Sitting-balance support: Feet  supported;Bilateral upper extremity supported Sitting balance-Leahy Scale: Fair Sitting balance - Comments: fiar sitting balance while sitting in recliner without back support Postural control: Posterior lean (likely due to bladder discomfort)                                 ADL either performed or assessed with clinical judgement   ADL Overall ADL's : Needs assistance/impaired     Grooming: Set up;Sitting Grooming Details (indicate cue type and reason): while sitting in the chair Upper Body Bathing: Min guard;Sitting Upper Body Bathing Details (indicate cue type and reason): pt needed verbal cues for initiating task Lower Body Bathing: Moderate assistance;Sitting/lateral leans Lower Body Bathing Details (indicate cue type and reason): Pt laterally leaned onto bed while sitting in recliner in order to reach rear peri area for bathing task, mrequired therapist asssit for thoroughness Upper Body Dressing : Set up;Sitting                   Functional mobility during ADLs: Maximal assistance;+2 for physical assistance;+2 for safety/equipment General ADL Comments: session limited to chair level with lateral leans. pt had complaints of pain and pressure in his bladder throughout session, and it incrased with leaning. NT and RN aware      Cognition Arousal/Alertness: Awake/alert Behavior During Therapy: WFL for tasks assessed/performed Overall Cognitive Status: Within Functional Limits for tasks assessed  General Comments pt's scrotum has incrased swelling and bladder is very distended. Pt also has skin tear on penis. RN and NT aware of all    Pertinent Vitals/ Pain       Pain Assessment: Faces Faces Pain Scale: Hurts whole lot Pain Location: bladder Pain Descriptors / Indicators: Pressure;Discomfort;Grimacing Pain Intervention(s): Monitored during session;Limited activity within patient's  tolerance   Frequency  Min 2X/week        Progress Toward Goals  OT Goals(current goals can now be found in the care plan section)  Progress towards OT goals: Progressing toward goals  Acute Rehab OT Goals Patient Stated Goal: Get stronger OT Goal Formulation: With patient Time For Goal Achievement: 05/06/21 Potential to Achieve Goals: Good ADL Goals Pt Will Perform Lower Body Bathing: with mod assist;sitting/lateral leans;bed level Pt Will Perform Lower Body Dressing: with mod assist;sitting/lateral leans Pt Will Transfer to Toilet: with mod assist;with transfer board Pt Will Perform Toileting - Clothing Manipulation and hygiene: with mod assist;sitting/lateral leans;bed level Additional ADL Goal #1: Pt will tolerate sittin gEOB unsupported for 5 mins, maintaining midline  Plan Discharge plan needs to be updated       AM-PAC OT "6 Clicks" Daily Activity     Outcome Measure   Help from another person eating meals?: A Little Help from another person taking care of personal grooming?: A Little Help from another person toileting, which includes using toliet, bedpan, or urinal?: A Lot Help from another person bathing (including washing, rinsing, drying)?: A Lot Help from another person to put on and taking off regular upper body clothing?: A Little Help from another person to put on and taking off regular lower body clothing?: A Lot 6 Click Score: 15    End of Session Equipment Utilized During Treatment:  (wound vac)  OT Visit Diagnosis: Unsteadiness on feet (R26.81);Other abnormalities of gait and mobility (R26.89);Muscle weakness (generalized) (M62.81);Pain Pain - Right/Left: Left Pain - part of body: Leg   Activity Tolerance Patient limited by pain   Patient Left in chair;with call bell/phone within reach;with chair alarm set   Nurse Communication Mobility status (bladder and scrotal swelling)        Time: [id number] OT Time Calculation (min): 21 min  Charges:  OT General Charges $OT Visit: 1 Visit OT Treatments $Self Care/Home Management : 8-22 mins     Danette Weinfeld A Brittany Amirault 04/24/2021, 11:01 AM

## 2021-04-24 NOTE — Progress Notes (Signed)
Inpatient Rehab Admissions Coordinator:   I spoke with pt. Regarding potential CIR admit at this time. Pt. Stated he was very fatigued from therapy this morning and does not think he can handle intensive rehab. States that he prefers SNF. I will let TOC know.   Clemens Catholic, [place], [place] Admissions Coordinator  [phone] ([place]) [phone] (office)

## 2021-04-24 NOTE — Anesthesia Postprocedure Evaluation (Signed)
Anesthesia Post Note  Patient: Stephen Whitaker  Procedure(s) Performed: LEFT BELOW KNEE AMPUTATION (Left: Knee)     Patient location during evaluation: PACU Anesthesia Type: General Level of consciousness: awake and alert and oriented Pain management: pain level controlled Vital Signs Assessment: post-procedure vital signs reviewed and stable Respiratory status: spontaneous breathing, nonlabored ventilation and respiratory function stable Cardiovascular status: blood pressure returned to baseline Postop Assessment: no apparent nausea or vomiting Anesthetic complications: no   No notable events documented.        Marthenia Rolling

## 2021-04-24 NOTE — Progress Notes (Addendum)
Physical Therapy Treatment Patient Details Name: Stephen Whitaker MRN: [id number] DOB: 1955-10-21 Today's Date: 04/24/2021    History of Present Illness Mr. Call is a 65 y.o.-year-old male who presented with osteomyelitis abscess and ulceration of the left heel. Underwent Lt transtibial amputation 04/21/21 with wound vac (admitted 04/15/21), post op having difficulties with hypoglycemia.  Pt with significant PMH of COPD, gout, HTN, mild mitral regurgitation, R 2nd toe amputation, multiple L LE amputations, CKD 3B, SVT.    PT Comments    Pt is progressing well with mobility, able to laterally scoot to recliner chair today with one person heavy mod assist.  We discussed needing a more handicap accessible place and speaking with his landlord to help get this set up for him.  PT will continue to follow acutely for safe mobility progression.  Follow Up Recommendations  Addendum: spoke to CIR coordinator who states pt does not feel he can tolerate 3 hrs of therapy per day and would prefer the less intense SNF level therapy option, so d/c changed to SNF      Equipment Recommendations  Wheelchair (measurements PT);Wheelchair cushion (measurements PT);3in1 (PT);Hospital bed (drop arm 3-in 1, tub transfer bench, amputee leg rest for WC)    Recommendations for Other Services Rehab consult     Precautions / Restrictions Precautions Precautions: Fall Required Braces or Orthoses: Splint/Cast Splint/Cast: L limb protector, L wound vac Restrictions LLE Weight Bearing: Non weight bearing    Mobility  Bed Mobility Overal bed mobility: Needs Assistance Bed Mobility: Supine to Sit     Supine to sit: Mod assist;HOB elevated     General bed mobility comments: Mod assist with HOB maximally elevated, cues for hand placement and sequencing, assist needed at bil LEs initially, then at trunk and hips to scoot to EOB and lift trunk up from bed with heavy reliance on bed rail for support.     Transfers Overall transfer level: Needs assistance Equipment used: None Transfers: Lateral/Scoot Transfers          Lateral/Scoot Transfers: Mod assist;From elevated surface General transfer comment: heavy mod assist to help pt to scoot from high to low surface to his right into drop arm recliner chair.  Pt assisting with his arms bil with cues from therapist about best leverage points, assist at hips and to keep pt from sliding forward while scooting laterally.  Ambulation/Gait                 Stairs             Wheelchair Mobility    Modified Rankin (Stroke Patients Only)       Balance Overall balance assessment: Needs assistance Sitting-balance support: Feet supported;Bilateral upper extremity supported Sitting balance-Leahy Scale: Poor Sitting balance - Comments: min asist EOB posterior preference, but pt reporting uncomfortable bladder pressure in sitting which is likely why he is leaning back, uanble to urinate and RN made aware. Postural control: Posterior lean                                  Cognition Arousal/Alertness: Awake/alert Behavior During Therapy: WFL for tasks assessed/performed Overall Cognitive Status: Within Functional Limits for tasks assessed                                        Exercises  General Comments General comments (skin integrity, edema, etc.): educated on self massage of L leg to help with phantom pain.      Pertinent Vitals/Pain Pain Assessment: Faces Faces Pain Scale: Hurts even more Pain Location: left foot Pain Descriptors / Indicators: Grimacing;Guarding Pain Intervention(s): Monitored during session;Limited activity within patient's tolerance;Repositioned    Home Living                      Prior Function            PT Goals (current goals can now be found in the care plan section) Acute Rehab PT Goals Patient Stated Goal: Get stronger Progress towards PT  goals: Progressing toward goals    Frequency    Min 3X/week      PT Plan Current plan remains appropriate    Co-evaluation              AM-PAC PT "6 Clicks" Mobility   Outcome Measure  Help needed turning from your back to your side while in a flat bed without using bedrails?: A Lot Help needed moving from lying on your back to sitting on the side of a flat bed without using bedrails?: A Lot Help needed moving to and from a bed to a chair (including a wheelchair)?: A Lot Help needed standing up from a chair using your arms (e.g., wheelchair or bedside chair)?: Total Help needed to walk in hospital room?: Total Help needed climbing 3-5 steps with a railing? : Total 6 Click Score: 9    End of Session   Activity Tolerance: Patient limited by pain Patient left: in chair;with call bell/phone within reach;Other (comment) (OT and RN tech in room preparing for bath) Nurse Communication: Mobility status;Need for lift equipment PT Visit Diagnosis: Muscle weakness (generalized) (M62.81);Difficulty in walking, not elsewhere classified (R26.2);Pain Pain - Right/Left: Left Pain - part of body: Leg     Time: 0931-1006 PT Time Calculation (min) (ACUTE ONLY): 35 min  Charges:  $Therapeutic Activity: 23-37 mins                    Verdene Lennert, PT, DPT  Acute Rehabilitation Ortho Tech Supervisor [phone] pager [phone]) [phone] office

## 2021-04-24 NOTE — Progress Notes (Signed)
PROGRESS NOTE   AMELIA OLESON  [id number] DOB: [birth year] DOA: 04/15/2021 PCP: [name], NP  Brief Narrative:   65 year old black male community dwelling known COPD, systolic CHF EF 0000000, DM TY 2, HTN, CKD 3B, SVT atrial tachycardia, gout, severe malnutrition Follows Dr. Sharol Given 2/2 to chronic left Charcot foot ulceration-been on doxycycline--scheduled to have BKA 04/19/2021 Started on Rocephin Flagyl vancomycin on admission-ABIs ordered on admission found to have mild AKI, thrush and started on nystatin Cardiology noted moderate risk for surgery but cleared for the same  Awaiting amputation which had to be postponed until 04/21/2021 at patient request  Hospital-Problem based course  Left Charcot foot ulceration status post pain control Norco 1-2 tab every 4 as needed moderate pain, morphine 2 mg every 2 as needed severe pain--adjust pain meds as needed over the next several days Perioperative antibiotics have been completed Patient awaiting placement to skilled facility and seems to be improved to the point that he can discharge Severe hypoglycemia secondary to poor p.o. intake 9/1 Blood sugars 80s to 150s but not eating much HFrEF chronic without any acute component Entresto restarted 04/22/2021, continue Toprol-XL 25 daily  AKI superimposed on CKD 3B baseline 1.2 Meds adjusted as above--- hold off on Demadex going forward, labs in a.m. Chronic gout Continue allopurinol 300 daily COPD without exacerbation Continue Dulera 2 puffs twice daily, Singulair 10 at bedtime, albuterol every 4 as needed for wheeze LUTS, probable BPH Retaining about 1000 cc of urine-we will probably need voiding trial in the outpatient setting Start Flomax 0.4 Oropharyngeal thrush Continue nystatin 4 times daily started on 8/28, finish at least 10 days in the outpatient setting SVT atrial tachycardia in the past Continues on beta-blocker    DVT prophylaxis: SCD Code Status: Full Family  Communication: None at bedside Disposition:  Status is: Inpatient  Remains inpatient appropriate because:Hemodynamically unstable, Unsafe d/c plan, IV treatments appropriate due to intensity of illness or inability to take PO, and Inpatient level of care appropriate due to severity of illness  Dispo: The patient is from: Home              Anticipated d/c is to: SNF probably in the next 24 to 48-hour depending on if renal function continues to be improved              Patient currently is not medically stable to d/c.   Difficult to place patient No       Consultants:  Orthopedics  Procedures:   Antimicrobials: As above   Subjective:  Overall doing fair no chest pain no fever needed Foley placement because of retention of over 900 cc   Objective: Vitals:   04/23/21 1700 04/23/21 2046 04/24/21 0407 04/24/21 0758  BP: 121/86 119/89 106/84 (!) 119/92  Pulse: 99 94 92 98  Resp: '18 17 18 18  '$ Temp: 97.6 F (36.4 C) 98.2 F (36.8 C) 97.7 F (36.5 C) 97.6 F (36.4 C)  TempSrc: Axillary Oral Oral Oral  SpO2: 95% 100% 98% 99%  Weight:      Height:        Intake/Output Summary (Last 24 hours) at 04/24/2021 1455 Last data filed at 04/24/2021 1100 Gross per 24 hour  Intake --  Output 1900 ml  Net -1900 ml    Filed Weights   04/16/21 1958  Weight: 90.7 kg    Examination:  Pleasant coherent no distress EOMI NCAT sitting up in chair No fever no chills Chest clear no added sound no  rales no rhonchi Slight abdominal tenderness no rebound no guarding ROM intact Left lower extremity in brace-wound not examined  Data Reviewed: personally reviewed   CBC    Component Value Date/Time   WBC 9.9 04/23/2021 0215   RBC 5.08 04/23/2021 0215   HGB 13.8 04/23/2021 0215   HGB 11.6 (L) 12/08/2020 1636   HGB 12.4 (L) 08/08/2010 1010   HCT 43.2 04/23/2021 0215   HCT 36.7 (L) 12/08/2020 1636   HCT 37.5 (L) 08/08/2010 1010   PLT 418 (H) 04/23/2021 0215   PLT 275 12/08/2020 1636    MCV 85.0 04/23/2021 0215   MCV 84 12/08/2020 1636   MCV 84.3 08/08/2010 1010   MCH 27.2 04/23/2021 0215   MCHC 31.9 04/23/2021 0215   RDW 17.8 (H) 04/23/2021 0215   RDW 13.7 12/08/2020 1636   RDW 13.9 08/08/2010 1010   LYMPHSABS 0.5 (L) 04/22/2021 0047   LYMPHSABS 0.9 06/26/2019 1035   LYMPHSABS 1.0 08/08/2010 1010   MONOABS 0.3 04/22/2021 0047   MONOABS 0.5 08/08/2010 1010   EOSABS 0.0 04/22/2021 0047   EOSABS 0.1 06/26/2019 1035   BASOSABS 0.0 04/22/2021 0047   BASOSABS 0.0 06/26/2019 1035   BASOSABS 0.0 08/08/2010 1010   CMP Latest Ref Rng & Units 04/23/2021 04/22/2021 04/21/2021  Glucose 70 - 99 mg/dL 111(H) 125(H) 130(H)  BUN 8 - 23 mg/dL 48(H) 39(H) 36(H)  Creatinine 0.61 - 1.24 mg/dL 2.22(H) 2.05(H) 1.73(H)  Sodium 135 - 145 mmol/L 136 137 136  Potassium 3.5 - 5.1 mmol/L 3.4(L) 3.6 3.4(L)  Chloride 98 - 111 mmol/L 101 102 103  CO2 22 - 32 mmol/L '23 25 23  '$ Calcium 8.9 - 10.3 mg/dL 8.3(L) 8.3(L) 8.2(L)  Total Protein 6.5 - 8.1 g/dL - 6.3(L) -  Total Bilirubin 0.3 - 1.2 mg/dL - 0.5 -  Alkaline Phos 38 - 126 U/L - 88 -  AST 15 - 41 U/L - 41 -  ALT 0 - 44 U/L - 23 -     Radiology Studies: No results found.   Scheduled Meds:  (feeding supplement) PROSource Plus  30 mL Oral TID BM   allopurinol  300 mg Oral Daily   vitamin C  1,000 mg Oral Daily   Chlorhexidine Gluconate Cloth  6 each Topical Daily   docusate sodium  100 mg Oral Daily   feeding supplement  296 mL Oral Once   fluticasone  1 spray Each Nare BID   lactase  6,000 Units Oral TID WC   loratadine  10 mg Oral Daily   metoprolol succinate  25 mg Oral Daily   mometasone-formoterol  2 puff Inhalation BID   montelukast  10 mg Oral QHS   multivitamin with minerals  1 tablet Oral Daily   nutrition supplement (JUVEN)  1 packet Oral BID BM   nystatin  5 mL Oral QID   olopatadine  1 drop Both Eyes BID   pantoprazole  40 mg Oral Daily   sacubitril-valsartan  1 tablet Oral BID   zinc sulfate  220 mg Oral Daily    Continuous Infusions:     LOS: 8 days   Time spent: 36  Nita Sells, MD [facility] To contact the attending provider between 7A-7P or the covering provider during after hours 7P-7A, please log into the web site [email or web address] and access using universal [place] password for that web site. If you do not have the password, please call the hospital operator.  04/24/2021, 2:55 PM

## 2021-04-24 NOTE — Progress Notes (Signed)
Order to insert foley given by MD. 64f foley inserted with immediate urine return.

## 2021-04-24 NOTE — Progress Notes (Signed)
D50 administered as part of the hypoglycemia protocol after unsuccessful blood sugar response from orange juice administration. Will monitor blood sugars

## 2021-04-25 LAB — CBC WITH DIFFERENTIAL/PLATELET
Abs Immature Granulocytes: 0.03 10*3/uL (ref 0.00–0.07)
Basophils Absolute: 0 10*3/uL (ref 0.0–0.1)
Basophils Relative: 0 %
Eosinophils Absolute: 0 10*3/uL (ref 0.0–0.5)
Eosinophils Relative: 0 %
HCT: 38.4 % — ABNORMAL LOW (ref 39.0–52.0)
Hemoglobin: 12.5 g/dL — ABNORMAL LOW (ref 13.0–17.0)
Immature Granulocytes: 1 %
Lymphocytes Relative: 6 %
Lymphs Abs: 0.3 10*3/uL — ABNORMAL LOW (ref 0.7–4.0)
MCH: 27.6 pg (ref 26.0–34.0)
MCHC: 32.6 g/dL (ref 30.0–36.0)
MCV: 84.8 fL (ref 80.0–100.0)
Monocytes Absolute: 0.4 10*3/uL (ref 0.1–1.0)
Monocytes Relative: 6 %
Neutro Abs: 5.1 10*3/uL (ref 1.7–7.7)
Neutrophils Relative %: 87 %
Platelets: 305 10*3/uL (ref 150–400)
RBC: 4.53 MIL/uL (ref 4.22–5.81)
RDW: 17.8 % — ABNORMAL HIGH (ref 11.5–15.5)
WBC: 5.9 10*3/uL (ref 4.0–10.5)
nRBC: 0 % (ref 0.0–0.2)

## 2021-04-25 LAB — BASIC METABOLIC PANEL
Anion gap: 9 (ref 5–15)
BUN: 57 mg/dL — ABNORMAL HIGH (ref 8–23)
CO2: 24 mmol/L (ref 22–32)
Calcium: 8.1 mg/dL — ABNORMAL LOW (ref 8.9–10.3)
Chloride: 104 mmol/L (ref 98–111)
Creatinine, Ser: 2.27 mg/dL — ABNORMAL HIGH (ref 0.61–1.24)
GFR, Estimated: 31 mL/min — ABNORMAL LOW (ref >60)
Glucose, Bld: 95 mg/dL (ref 70–99)
Potassium: 3.3 mmol/L — ABNORMAL LOW (ref 3.5–5.1)
Sodium: 137 mmol/L (ref 135–145)

## 2021-04-25 LAB — GLUCOSE, CAPILLARY
Glucose-Capillary: 45 mg/dL — ABNORMAL LOW (ref 70–99)
Glucose-Capillary: 26 mg/dL — CL (ref 70–99)

## 2021-04-25 LAB — SURGICAL PATHOLOGY

## 2021-04-25 LAB — SARS CORONAVIRUS 2 (TAT 6-24 HRS): SARS Coronavirus 2: NEGATIVE

## 2021-04-25 LAB — GLUCOSE, RANDOM: Glucose, Bld: 105 mg/dL — ABNORMAL HIGH (ref 70–99)

## 2021-04-25 MED ORDER — TAMSULOSIN HCL 0.4 MG PO CAPS: 0.4000 mg | ORAL_CAPSULE | Freq: Every day | ORAL | 0 refills | Status: DC

## 2021-04-25 MED ORDER — HYDROCODONE-ACETAMINOPHEN 5-325 MG PO TABS: 1.0000 | ORAL_TABLET | ORAL | 0 refills | Status: DC | PRN

## 2021-04-25 MED ORDER — ACETAMINOPHEN 325 MG PO TABS
650.0000 mg | ORAL_TABLET | Freq: Four times a day (QID) | ORAL | Status: DC | PRN
Start: 2021-04-25 — End: 2021-07-05

## 2021-04-25 MED ORDER — POLYETHYLENE GLYCOL 3350 17 G PO PACK: 17.0000 g | PACK | Freq: Every day | ORAL | 0 refills | Status: DC | PRN

## 2021-04-25 NOTE — Progress Notes (Signed)
CCMD call to notify that the patient HR is sustaining in 150s, notify Dr. Verlon Au, he wants to monitor the patient for an hour and if continue to run 150s, obtain EKG.

## 2021-04-25 NOTE — Progress Notes (Signed)
Patient ID: Stephen Whitaker, male   DOB: 1955-09-02, 65 y.o.   MRN: [id number] Patient is status post left transtibial amputation.  There is no drainage of the wound VAC canister there is 1 check.  Anticipate discharge to skilled nursing.

## 2021-04-25 NOTE — Progress Notes (Signed)
Nutrition Follow-up  DOCUMENTATION CODES:   Severe malnutrition in context of acute illness/injury  INTERVENTION:   -Continue Magic cup TID with meals, each supplement provides 290 kcal and 9 grams of protein  -Continue 6000 mg lactase TID -Continue Hormel Shake TID, each supplement provides 520 kcals and 15 grams protein -Continue MVI with minerals daily  NUTRITION DIAGNOSIS:   Severe Malnutrition related to acute illness (lt foot osteomyelitis) as evidenced by moderate fat depletion, moderate muscle depletion, edema.  Ongoing  GOAL:   Patient will meet greater than or equal to 90% of their needs  Progressing   MONITOR:   PO intake, Supplement acceptance, Diet advancement, Labs, Weight trends, Skin, I & O's  REASON FOR ASSESSMENT:   Consult Wound healing  ASSESSMENT:   Stephen Whitaker is a 65 y.o. male with medical history significant of COPD; chronic systolic CHF (EF 0000000); DM; and HTN presenting with foot infection.  He has been followed by Dr. Sharol Given for his chronic L Charcot foot ulceration and has been on doxy with wound care.  He was last seen on 8/25 and was planning for BKA this coming Wednesday (8/31).   The patient worked yesterday and drove home but was too tired to get out of the car and fell asleep in the driveway.  Neighbors helped him inside and then he was too weak to get off the commode and so he came to the ER.  He denied any additional physical concerns at the time of our discussion.  He did report unstable living situation (landlord is evicting him), need for assistance in obtaining short and long-term disability through his work, and probable need for placement after surgery since he does not have other available housing options.  9/2- s/p lt BKA  Reviewed I/O's: -2.8 L x 24 hours and -3.7 L since admission  UOP: 3.3 L x 24 hours  Spoke with pt at bedside, who complains of being uncomfortable. Staff made aware.   Pt reports that appetite is  picking up since surgery. Noted meal completion 25-75%. Pt consumed about 50% of his breakfast tray. He shares that he is consuming Hotel manager. He reports that he feels he has better food selections since being liberalized to regular diet and with being provided lactaid pills.   Pt ordered Juven, however, will d/c due to product being on national back order.   Reinforced continued good meal and supplement intake to promote post-operative healing.   Per TOC notes, plan for SNF at discharge.   Medications reviewed and include vitamin C, colace, and zinc sulfate.   Labs reviewed: K: 3.3. CBGS: 31-125.   Diet Order:   Diet Order             Diet regular Room service appropriate? Yes; Fluid consistency: Thin  Diet effective now                   EDUCATION NEEDS:   Education needs have been addressed  Skin:  Skin Assessment: Skin Integrity Issues: Skin Integrity Issues:: Wound VAC Stage II: buttocks Wound Vac: s/p lt BKA Diabetic Ulcer: -  Last BM:  04/25/21  Height:   Ht Readings from Last 1 Encounters:  04/16/21 '6\' 2"'$  (1.88 m)    Weight:   Wt Readings from Last 1 Encounters:  04/16/21 90.7 kg    Ideal Body Weight:  78.2 kg (adjusted for lt BKA)  BMI:  Body mass index is 25.68 kg/m.  Estimated Nutritional Needs:  Kcal:  2350-2550  Protein:  130-155 grams  Fluid:  > 2 L    Loistine Chance, RD, LDN, [place] Registered Dietitian II Certified Diabetes Care and Education Specialist Please refer to [facility] for RD and/or RD on-call/weekend/after hours pager

## 2021-04-25 NOTE — TOC Progression Note (Addendum)
Transition of Care [facility]) - Progression Note    Patient Details  Name: Stephen Whitaker MRN: [id number] Date of Birth: [birth year]  [facility] CM/SW Contact  Emeterio Reeve, [place] Phone Number: 04/25/2021, 1:40 PM  Clinical Narrative:     CSW gave pt his only bed offer. Pt accepted. CSW reached out to SNF. They can accept pt tomorrow 9/7. CSW requested covid test.  Pts passr number is [phone].  Pt requested a flip phone and food stamps at discharge. CSW informed pt that CSW will not be able to provide that.   Expected Discharge Plan: [place] Barriers to Discharge: Continued Medical Work up, Ship broker  Expected Discharge Plan and Services Expected Discharge Plan: [place] Choice: [place] Living arrangements for the past 2 months: Apartment                                       Social Determinants of Health (SDOH) Interventions Food Insecurity Interventions: Intervention Not Indicated Financial Strain Interventions: Other (Comment) (CSW consulted) Housing Interventions: Other (Comment) (air ducts have mold per pt statement.) Transportation Interventions: Intervention Not Indicated  Readmission Risk Interventions No flowsheet data found.  Emeterio Reeve, LCSW Clinical Social Worker

## 2021-04-25 NOTE — Progress Notes (Signed)
Heart Failure Nurse Navigator Progress Note  Placed [facility] TOC appt for 9/13 @ 11AM. Canceled [facility] appt with Dr. Gasper Sells for 9/14 per conversation. Message send to [address] scheduler pool to arrange future appt.   Pt will DC to SNF, transportation via facility.  Pricilla Holm, MSN, RN Heart Failure Nurse Navigator [phone]

## 2021-04-25 NOTE — Progress Notes (Signed)
Check blood sugar, reported 26- 3x, 45- once. Gave patient orange juice and cobbler, recheck blood sugar is still 26.  Patient has ate at least 50% of his meals today. Notify MD via page, no return responds.

## 2021-04-25 NOTE — Discharge Summary (Signed)
Physician Discharge Summary  Stephen Whitaker [id number] DOB: [birth year] DOA: 04/15/2021  PCP: [name], NP  Admit date: 04/15/2021 Discharge date: 04/25/2021  Time spent: 27 minutes  Recommendations for Outpatient Follow-up:  Liberalize diet and allow regular diet for now given he is not taking in enough calories gets intermittently hypoglycemic Will need CBC Chem-12 in about 1 week post discharge Outpatient wound VAC management and wound care per Dr. Sharol Given who should see him postoperatively in his office Outpatient heart failure follow-up as per their orders--I have held [facility] can be resumed cautiously in about 1 week from discharge at lower dose of may be 20 daily Developed acute urinary retention during hospital stay retaining over 1000 cc of urine-Foley was placed and will need outpatient coordination with urology for the same   Discharge Diagnoses:  MAIN problem for hospitalization   Left Charcot foot ulceration status post amputation  Please see below for itemized issues addressed in [name]- refer to other progress notes for clarity if needed  Discharge Condition: Fair  Diet recommendation: Regular for now  [facility] Weights   04/16/21 1958  Weight: 90.7 kg    History of present illness:  65 year old black male community dwelling known COPD, systolic CHF EF 0000000, DM TY 2, HTN, CKD 3B, SVT atrial tachycardia, gout, severe malnutrition Follows Dr. Sharol Given 2/2 to chronic left Charcot foot ulceration-been on doxycycline--scheduled to have BKA 04/19/2021 Started on Rocephin Flagyl vancomycin on admission-ABIs ordered on admission found to have mild AKI, thrush and started on nystatin Cardiology noted moderate risk for surgery but cleared for the same  Awaiting amputation which had to be postponed until 04/21/2021 at patient request  Hospital Course:  Left Charcot foot ulceration status post left transtibial amputation pain control Norco 1-2 tab every 4 as needed   Patient awaiting placement to skilled facility and seems to be improved to the point that he can discharge Severe hypoglycemia secondary to poor p.o. intake since 9/1 Blood sugars intermittently low but patient eating almost no food at times Patient has been liberalized to regular diet--unlikely , but might need work-up for other causes such as insulinoma etc. in the outpatient setting if continues to drop--- if this is the case consider addition of steroids to  to keep his blood sugar up HFrEF chronic without any acute component Entresto restarted 04/22/2021, continue Toprol-XL 25 daily  AKI superimposed on CKD 3B baseline 1.2 Meds adjusted as above--- hold off on Demadex going forward--may re-initiate lower dose after OP labs Chronic gout Continue allopurinol 300 daily COPD without exacerbation Continue Dulera 2 puffs twice daily, Singulair 10 at bedtime, albuterol every 4 as needed for wheeze LUTS, probable BPH Retaining about 1000 cc of urine-we will probably need voiding trial in the outpatient setting Start Flomax 0.4 this admission Oropharyngeal thrush Continue nystatin 4 times and completed therapy prior to discharge SVT atrial tachycardia in the past Continues on beta-blocker   Procedures: Amputation as per Dr. Sharol Given   Consultations: Orthopedics  Discharge Exam: Vitals:   04/25/21 0549 04/25/21 0917  BP: 106/79 110/78  Pulse: 91 97  Resp: 17 17  Temp: 98.2 F (36.8 C) 97.7 F (36.5 C)  SpO2: 97% 100%    Subj on day of d/c   Awake eating a little better no chest pain no fever Blood sugars occasionally low  General Exam on discharge  EOMI NCAT no focal deficit CTA B no rales no rhonchi  abdomen midepigastric tenderness --no rebound no guarding Left lower extremity  wound in wound stump shrinker  Discharge Instructions   Discharge Instructions     Diet - low sodium heart healthy   Complete by: As directed    Increase activity slowly   Complete by: As  directed    Negative Pressure Wound Therapy - Incisional   Complete by: As directed    Show patient how to attach preveena vac. Should call office if alarms   No wound care   Complete by: As directed       Allergies as of 04/25/2021       Reactions   Ciprofloxacin Rash, Other (See Comments)   SYNCOPE    Shellfish Allergy Anaphylaxis   Sulfa Antibiotics Other (See Comments)   SYNCOPE DIZZINESS         Medication List     STOP taking these medications    allopurinol 300 MG tablet Commonly known as: ZYLOPRIM   Blood Pressure Monitoring Devi   doxycycline 100 MG tablet Commonly known as: VIBRA-TABS   Magnesium Oxide 400 MG Caps   Papaya and Enzymes Chew   torsemide 20 MG tablet Commonly known as: Demadex       TAKE these medications    acetaminophen 325 MG tablet Commonly known as: TYLENOL Take 2 tablets (650 mg total) by mouth every 6 (six) hours as needed for mild pain (or Fever >/= 101).   albuterol 108 (90 Base) MCG/ACT inhaler Commonly known as: Ventolin HFA Inhale 2 puffs into the lungs every 4 (four) hours as needed for wheezing or shortness of breath.   azelastine 0.1 % nasal spray Commonly known as: ASTELIN Place 1-2 sprays into both nostrils 2 (two) times daily as needed (nasal drainage). Use in each nostril as directed   BLUE-EMU HEMP EX Apply 1 application topically daily as needed.   budesonide-formoterol 160-4.5 MCG/ACT inhaler Commonly known as: SYMBICORT USE 2 INHALATIONS TWICE A DAY   Entresto 24-26 MG Generic drug: sacubitril-valsartan Take 1 tablet by mouth 2 (two) times daily.   EPINEPHrine 0.3 mg/0.3 mL Soaj injection Commonly known as: Auvi-Q Inject 0.3 mg into the muscle as needed for anaphylaxis.   fluticasone 50 MCG/ACT nasal spray Commonly known as: FLONASE USE 1 SPRAY IN EACH NOSTRIL TWICE A DAY   HYDROcodone-acetaminophen 5-325 MG tablet Commonly known as: NORCO/VICODIN Take 1-2 tablets by mouth every 4 (four)  hours as needed for moderate pain. What changed: when to take this   LACTAID FAST ACT PO Take 2 tablets by mouth as needed (with dairy food).   metoprolol succinate 25 MG 24 hr tablet Commonly known as: Toprol XL Take 1 tablet (25 mg total) by mouth daily.   Mitigare 0.6 MG Caps Generic drug: Colchicine Take 1 capsule by mouth as needed.   mometasone-formoterol 200-5 MCG/ACT Aero Commonly known as: DULERA Inhale 2 puffs into the lungs daily as needed for wheezing.   montelukast 10 MG tablet Commonly known as: SINGULAIR TAKE 1 TABLET AT BEDTIME   Olopatadine HCl 0.2 % Soln Apply 1 drop to eye daily as needed (itchy/watery eyes).   pantoprazole 40 MG tablet Commonly known as: PROTONIX TAKE ONE TABLET BY MOUTH 30 TO 60 MINUTES BEFORE YOUR FIRST AND LAST MEALS OF THE DAY What changed: See the new instructions.   polyethylene glycol 17 g packet Commonly known as: MIRALAX / GLYCOLAX Take 17 g by mouth daily as needed for mild constipation.   SPIRULINA PO Take 1 capsule by mouth 2 (two) times daily with a meal.   Super  Probiotic Digestive Caps Take 1 tablet by mouth as needed (stomach upset). Take as needed with meals   tamsulosin 0.4 MG Caps capsule Commonly known as: FLOMAX Take 1 capsule (0.4 mg total) by mouth daily after supper.       Allergies  Allergen Reactions   Ciprofloxacin Rash and Other (See Comments)    SYNCOPE    Shellfish Allergy Anaphylaxis   Sulfa Antibiotics Other (See Comments)    SYNCOPE DIZZINESS     Follow-up Information     Suzan Slick, NP Follow up in 1 week(s).   Specialty: Orthopedic Surgery Contact information: [place] [address] [place]. Go on 05/02/2021.   Specialty: Cardiology Why: Tuesday, Sept 13 @ 11AM for HV TOC appt within [place].  Bring all your medications with you.  FREE valet parking at [employer], off  [employer]. Contact information: [address] [id number] [place] [phone]                 The results of significant diagnostics from this hospitalization (including imaging, microbiology, ancillary and laboratory) are listed below for reference.    Significant Diagnostic Studies: DG Chest 2 View  Result Date: 04/15/2021 CLINICAL DATA:  Shortness of breath and edema. EXAM: CHEST - 2 VIEW COMPARISON:  October 07, 2020 FINDINGS: The heart size and mediastinal contours are within normal limits. Both lungs are clear. The visualized skeletal structures are unremarkable. IMPRESSION: No active cardiopulmonary disease. Electronically Signed   By: Virgina Norfolk M.D.   On: 04/15/2021 17:41   DG Foot 2 Views Left  Result Date: 04/16/2021 CLINICAL DATA:  Recent amputation.  Unable to get off of toilet. EXAM: LEFT FOOT - 2 VIEW COMPARISON:  04/06/2021 FINDINGS: Marked diffuse soft tissue swelling. Status post first and third amputation at the level of the MCP joints. Status post amputation of the second ray at the level of the head the metacarpal bone. Chronic neuropathic changes involving the hindfoot with collapse of the talar dome suspected There is a gas containing wound along the medial aspect of the midfoot overlying the navicular bone with signs of cortical destruction and erosion of the medial cortex of the navicular bone which appear progressive compared with 04/06/2021. IMPRESSION: 1. Findings compatible with progressive osteomyelitis of the medial aspect of the navicular bone with overlying soft tissue ulceration. 2. Marked diffuse soft tissue swelling. 3. Chronic advanced arthropathic changes of the hindfoot, likely neuropathic. Electronically Signed   By: Kerby Moors M.D.   On: 04/16/2021 05:01   XR Foot 2 Views Left  Result Date: 04/10/2021 2 view radiographs of the left foot shows previous great toe and second ray and third toe  amputation with no destructive bony changes through the talonavicular area.   Microbiology: Recent Results (from the past 240 hour(s))  Culture, blood (single) w Reflex to ID Panel     Status: None   Collection Time: 04/16/21  5:52 AM   Specimen: BLOOD  Result Value Ref Range Status   Specimen Description BLOOD SITE NOT SPECIFIED  Final   Special Requests   Final    BOTTLES DRAWN AEROBIC AND ANAEROBIC Blood Culture results may not be optimal due to an inadequate volume of blood received in culture bottles   Culture   Final    NO GROWTH 5 DAYS Performed at [facility], [address] [place], [place] [address]    Report Status 04/21/2021 FINAL  Final  Resp Panel by RT-PCR (Flu A&B, Covid) Nasopharyngeal Swab     Status: None   Collection Time: 04/16/21  7:11 AM   Specimen: Nasopharyngeal Swab; Nasopharyngeal(NP) swabs in vial transport medium  Result Value Ref Range Status   SARS Coronavirus 2 by RT PCR NEGATIVE NEGATIVE Final    Comment: (NOTE) SARS-CoV-2 target nucleic acids are NOT DETECTED.  The SARS-CoV-2 RNA is generally detectable in upper respiratory specimens during the acute phase of infection. The lowest concentration of SARS-CoV-2 viral copies this assay can detect is 138 copies/mL. A negative result does not preclude SARS-Cov-2 infection and should not be used as the sole basis for treatment or other patient management decisions. A negative result may occur with  improper specimen collection/handling, submission of specimen other than nasopharyngeal swab, presence of viral mutation(s) within the areas targeted by this assay, and inadequate number of viral copies(<138 copies/mL). A negative result must be combined with clinical observations, patient history, and epidemiological information. The expected result is Negative.  Fact Sheet for Patients:  EntrepreneurPulse.com.au  Fact Sheet for Healthcare Providers:   IncredibleEmployment.be  This test is no t yet approved or cleared by the [place] FDA and  has been authorized for detection and/or diagnosis of SARS-CoV-2 by FDA under an Emergency Use Authorization (EUA). This EUA will remain  in effect (meaning this test can be used) for the duration of the COVID-19 declaration under Section 564(b)(1) of the Act, 21 U.S.C.section 360bbb-3(b)(1), unless the authorization is terminated  or revoked sooner.       Influenza A by PCR NEGATIVE NEGATIVE Final   Influenza B by PCR NEGATIVE NEGATIVE Final    Comment: (NOTE) The Xpert Xpress SARS-CoV-2/FLU/RSV plus assay is intended as an aid in the diagnosis of influenza from Nasopharyngeal swab specimens and should not be used as a sole basis for treatment. Nasal washings and aspirates are unacceptable for Xpert Xpress SARS-CoV-2/FLU/RSV testing.  Fact Sheet for Patients: EntrepreneurPulse.com.au  Fact Sheet for Healthcare Providers: IncredibleEmployment.be  This test is not yet approved or cleared by the [place] FDA and has been authorized for detection and/or diagnosis of SARS-CoV-2 by FDA under an Emergency Use Authorization (EUA). This EUA will remain in effect (meaning this test can be used) for the duration of the COVID-19 declaration under Section 564(b)(1) of the Act, 21 U.S.C. section 360bbb-3(b)(1), unless the authorization is terminated or revoked.  Performed at [facility], [place] [address] [place], [place] [address]   Blood Cultures x 2 sites     Status: None   Collection Time: 04/16/21 10:38 AM   Specimen: BLOOD  Result Value Ref Range Status   Specimen Description BLOOD SITE NOT SPECIFIED  Final   Special Requests   Final    BOTTLES DRAWN AEROBIC AND ANAEROBIC Blood Culture adequate volume   Culture   Final    NO GROWTH 5 DAYS Performed at [facility], [address], [place], [place] [address]     Report Status 04/21/2021 FINAL  Final  Blood Cultures x 2 sites     Status: None   Collection Time: 04/16/21 10:46 AM   Specimen: BLOOD  Result Value Ref Range Status   Specimen Description BLOOD SITE NOT SPECIFIED  Final   Special Requests   Final    BOTTLES DRAWN AEROBIC AND ANAEROBIC Blood Culture adequate volume   Culture   Final  NO GROWTH 5 DAYS Performed at [facility], [place] [address] [place], [place] [address]    Report Status 04/21/2021 FINAL  Final  Surgical PCR screen     Status: None   Collection Time: 04/19/21  4:30 AM   Specimen: Nasal Mucosa; Nasal Swab  Result Value Ref Range Status   MRSA, PCR NEGATIVE NEGATIVE Final   Staphylococcus aureus NEGATIVE NEGATIVE Final    Comment: (NOTE) The Xpert SA Assay (FDA approved for NASAL specimens in patients 62 years of age and older), is one component of a comprehensive surveillance program. It is not intended to diagnose infection nor to guide or monitor treatment. Performed at [facility], [place] [address] [place], [place] [address]      Labs: Basic Metabolic Panel: Recent Labs  Lab 04/20/21 0242 04/21/21 0024 04/22/21 0047 04/23/21 0215 04/25/21 0054  NA 139 136 137 136 137  K 3.7 3.4* 3.6 3.4* 3.3*  CL 105 103 102 101 104  CO2 '23 23 25 23 24  '$ GLUCOSE 85 130* 125* 111* 95  BUN 36* 36* 39* 48* 57*  CREATININE 1.54* 1.73* 2.05* 2.22* 2.27*  CALCIUM 8.4* 8.2* 8.3* 8.3* 8.1*  PHOS  --  3.4  --   --   --    Liver Function Tests: Recent Labs  Lab 04/20/21 0242 04/21/21 0024 04/22/21 0047  AST 44*  --  41  ALT 28  --  23  ALKPHOS 83  --  88  BILITOT 0.9  --  0.5  PROT 6.1*  --  6.3*  ALBUMIN 1.8* 1.8* 1.9*   No results for input(s): LIPASE, AMYLASE in the last 168 hours. No results for input(s): AMMONIA in the last 168 hours. CBC: Recent Labs  Lab 04/21/21 0024 04/22/21 0047 04/23/21 0215 04/25/21 0054  WBC 5.6 8.2 9.9 5.9  NEUTROABS 4.5 7.4  --  5.1  HGB 12.5* 14.1 13.8 12.5*   HCT 39.3 44.4 43.2 38.4*  MCV 84.7 85.9 85.0 84.8  PLT 387 392 418* 305   Cardiac Enzymes: No results for input(s): CKTOTAL, CKMB, CKMBINDEX, TROPONINI in the last 168 hours. BNP: BNP (last 3 results) Recent Labs    01/31/21 2038 04/15/21 1714  BNP >4,500.0* >4,500.0*    ProBNP (last 3 results) Recent Labs    10/24/20 1517 01/24/21 1106 02/22/21 1158  PROBNP 7,418* 22,081* 20,322*    CBG: Recent Labs  Lab 04/24/21 1617 04/24/21 1704 04/24/21 1709 04/24/21 1742 04/24/21 2010  GLUCAP 59* 31* 40* 125* 82       Signed:  Nita Sells MD   [facility] 04/25/2021, 2:54 PM

## 2021-04-26 LAB — GLUCOSE, CAPILLARY
Glucose-Capillary: 111 mg/dL — ABNORMAL HIGH (ref 70–99)
Glucose-Capillary: 80 mg/dL (ref 70–99)
Glucose-Capillary: 44 mg/dL — CL (ref 70–99)
Glucose-Capillary: 111 mg/dL — ABNORMAL HIGH (ref 70–99)

## 2021-04-26 LAB — COMPREHENSIVE METABOLIC PANEL
ALT: 28 U/L (ref 0–44)
AST: 74 U/L — ABNORMAL HIGH (ref 15–41)
Albumin: 1.9 g/dL — ABNORMAL LOW (ref 3.5–5.0)
Alkaline Phosphatase: 170 U/L — ABNORMAL HIGH (ref 38–126)
Anion gap: 7 (ref 5–15)
BUN: 42 mg/dL — ABNORMAL HIGH (ref 8–23)
CO2: 26 mmol/L (ref 22–32)
Calcium: 8 mg/dL — ABNORMAL LOW (ref 8.9–10.3)
Chloride: 107 mmol/L (ref 98–111)
Creatinine, Ser: 1.31 mg/dL — ABNORMAL HIGH (ref 0.61–1.24)
GFR, Estimated: >60 mL/min (ref >60)
Glucose, Bld: 101 mg/dL — ABNORMAL HIGH (ref 70–99)
Potassium: 2.8 mmol/L — ABNORMAL LOW (ref 3.5–5.1)
Sodium: 140 mmol/L (ref 135–145)
Total Bilirubin: 0.6 mg/dL (ref 0.3–1.2)
Total Protein: 5.9 g/dL — ABNORMAL LOW (ref 6.5–8.1)

## 2021-04-26 LAB — PHOSPHORUS: Phosphorus: 1.9 mg/dL — ABNORMAL LOW (ref 2.5–4.6)

## 2021-04-26 LAB — MAGNESIUM: Magnesium: 1.5 mg/dL — ABNORMAL LOW (ref 1.7–2.4)

## 2021-04-26 MED ORDER — HYDROCODONE-ACETAMINOPHEN 5-325 MG PO TABS: 1.0000 | ORAL_TABLET | Freq: Four times a day (QID) | ORAL | 0 refills | Status: DC | PRN

## 2021-04-26 MED ORDER — LOPERAMIDE HCL 2 MG PO CAPS
2.0000 mg | ORAL_CAPSULE | ORAL | Status: DC | PRN
  Administered 2021-04-26 – 2021-04-30 (×8): 2 mg via ORAL
  Filled 2021-04-26 (×8): qty 1

## 2021-04-26 MED ORDER — ENSURE ENLIVE PO LIQD: 237.0000 mL | Freq: Three times a day (TID) | ORAL | Status: DC

## 2021-04-26 MED ORDER — POTASSIUM CHLORIDE CRYS ER 20 MEQ PO TBCR
40.0000 meq | EXTENDED_RELEASE_TABLET | Freq: Once | ORAL | Status: AC
  Administered 2021-04-26: 40 meq via ORAL
  Filled 2021-04-26: qty 2

## 2021-04-26 MED ORDER — POTASSIUM PHOSPHATES 15 MMOLE/5ML IV SOLN
15.0000 mmol | Freq: Once | INTRAVENOUS | Status: AC
  Administered 2021-04-26: 15 mmol via INTRAVENOUS
  Filled 2021-04-26: qty 5

## 2021-04-26 MED ORDER — ENSURE ENLIVE PO LIQD: 237.0000 mL | Freq: Three times a day (TID) | ORAL | 0 refills | Status: DC

## 2021-04-26 MED ORDER — MAGNESIUM SULFATE 2 GM/50ML IV SOLN
2.0000 g | Freq: Once | INTRAVENOUS | Status: AC
  Administered 2021-04-26: 2 g via INTRAVENOUS
  Filled 2021-04-26: qty 50

## 2021-04-26 MED ORDER — GLUCERNA SHAKE PO LIQD: 237.0000 mL | Freq: Three times a day (TID) | ORAL | 0 refills | Status: DC

## 2021-04-26 MED ORDER — GLUCERNA SHAKE PO LIQD
237.0000 mL | Freq: Three times a day (TID) | ORAL | Status: DC
  Administered 2021-04-26 – 2021-05-02 (×15): 237 mL via ORAL
  Filled 2021-04-26: qty 237

## 2021-04-26 NOTE — Consult Note (Signed)
WOC Nurse Consult Note: Patient receiving care in [facility]. Dr. Posey Pronto in room at time of my assessment. Reason for Consult: penile ulcer Wound type: unknown etiology partial thickness area at base of penile shaft Pressure Injury POA: Yes/No/NA Measurement: less that 1 cm Wound bed: 100% pink Drainage (amount, consistency, odor) none Periwound: intact, edematous Dressing procedure/placement/frequency: Twice daily application Criticaid clear to area. Monitor the wound area(s) for worsening of condition such as: Signs/symptoms of infection,  Increase in size,  Development of or worsening of odor, Development of pain, or increased pain at the affected locations.  Notify the medical team if any of these develop.  Thank you for the consult.[facility] nurse will not follow at this time.  Please re-consult the Campus team if needed.  Val Riles, RN, MSN, CWOCN, CNS-BC, pager [phone]

## 2021-04-26 NOTE — Progress Notes (Signed)
Physical Therapy Treatment Patient Details Name: Stephen Whitaker MRN: [id number] DOB: 09/07/1955 Today's Date: 04/26/2021    History of Present Illness Stephen Whitaker is a 65 y.o.-year-old male who presented with osteomyelitis abscess and ulceration of the left heel. Underwent Lt transtibial amputation 04/21/21 with wound vac (admitted 04/15/21), post op having difficulties with hypoglycemia.  Pt with significant PMH of COPD, gout, HTN, mild mitral regurgitation, R 2nd toe amputation, multiple L LE amputations, CKD 3B, SVT.    PT Comments    Pt relays that he does not feel ready to d/c to SNF. Performed supine and sitting amputee exercises. Worked on scooting along EOB with use of RLE for pushing and mod A. Pt instructed to perform LE strengthening on R as much as L as RLE strength <3/5. PT will continue to follow.    Follow Up Recommendations  SNF (Pt does not feel he can tolerate CIR per CIR coordinator)     Equipment Recommendations  Wheelchair (measurements PT);Wheelchair cushion (measurements PT);3in1 (PT);Hospital bed (drop arm 3-in 1, tub transfer bench, amputee leg rest for WC)    Recommendations for Other Services       Precautions / Restrictions Precautions Precautions: Fall Required Braces or Orthoses: Splint/Cast Splint/Cast: L limb protector, L wound vac Restrictions Weight Bearing Restrictions: Yes LLE Weight Bearing: Non weight bearing    Mobility  Bed Mobility Overal bed mobility: Needs Assistance Bed Mobility: Supine to Sit     Supine to sit: Mod assist;HOB elevated     General bed mobility comments: pt needed mod A to LE's to come to EOB as well as mod A at trunk    Transfers Overall transfer level: Needs assistance Equipment used: None Transfers: Lateral/Scoot Transfers          Lateral/Scoot Transfers: Mod assist General transfer comment: worked on scooting along EOB focusing on pushing with RLE. Needed mod A for gwd wt shift to clear buttocks to  be able to truly scoot  Ambulation/Gait                 Stairs             Wheelchair Mobility    Modified Rankin (Stroke Patients Only)       Balance Overall balance assessment: Needs assistance Sitting-balance support: Feet supported;Bilateral upper extremity supported Sitting balance-Leahy Scale: Good Sitting balance - Comments: able to wt shift side to side in sitting without LOB                                    Cognition Arousal/Alertness: Awake/alert Behavior During Therapy: WFL for tasks assessed/performed Overall Cognitive Status: Within Functional Limits for tasks assessed                                        Exercises Amputee Exercises Quad Sets: Both;10 reps;Supine Gluteal Sets: Strengthening;Both;10 reps;Supine Hip ABduction/ADduction: AROM;Both;10 reps;Supine;AAROM (AA on R) Knee Flexion: AROM;Left;10 reps;Seated Knee Extension: AROM;Left;10 reps;Seated Straight Leg Raises: AROM;AAROM;Both;10 reps;Supine (AA on R)    General Comments General comments (skin integrity, edema, etc.): pt requested to not go to chair as he is supposed to d/c to SNF today      Pertinent Vitals/Pain Pain Assessment: Faces Faces Pain Scale: Hurts even more Pain Location: LLE Pain Descriptors / Indicators: Discomfort;Aching;Grimacing Pain Intervention(s): Monitored during  session;Repositioned    Home Living                      Prior Function            PT Goals (current goals can now be found in the care plan section) Acute Rehab PT Goals Patient Stated Goal: Get stronger PT Goal Formulation: With patient Time For Goal Achievement: 05/06/21 Potential to Achieve Goals: Good Progress towards PT goals: Progressing toward goals    Frequency    Min 2X/week (would benefit from more)      PT Plan Current plan remains appropriate    Co-evaluation              AM-PAC PT "6 Clicks" Mobility   Outcome  Measure  Help needed turning from your back to your side while in a flat bed without using bedrails?: A Lot Help needed moving from lying on your back to sitting on the side of a flat bed without using bedrails?: A Lot Help needed moving to and from a bed to a chair (including a wheelchair)?: A Lot Help needed standing up from a chair using your arms (e.g., wheelchair or bedside chair)?: Total Help needed to walk in hospital room?: Total Help needed climbing 3-5 steps with a railing? : Total 6 Click Score: 9    End of Session   Activity Tolerance: Patient tolerated treatment well Patient left: in bed;with call bell/phone within reach Nurse Communication: Mobility status PT Visit Diagnosis: Muscle weakness (generalized) (M62.81);Difficulty in walking, not elsewhere classified (R26.2);Pain Pain - Right/Left: Left Pain - part of body: Leg     Time: [id number] PT Time Calculation (min) (ACUTE ONLY): 32 min  Charges:  $Therapeutic Exercise: 8-22 mins $Therapeutic Activity: 8-22 mins                     Leighton Roach, PT  Acute Rehab Services  Pager [phone] Office [place] 04/26/2021, 2:24 PM

## 2021-04-26 NOTE — Progress Notes (Signed)
Patient is sitting up in bed alert.  Wound VAC is functioning well with 1 green check.   I obtained a Prevena system and plugged it in the patient's room to charge.  He will transition to this when he discharges.  Will need 1 week follow-up in our office

## 2021-04-26 NOTE — Discharge Summary (Signed)
Physician Discharge Summary  Stephen Whitaker [id number] DOB: 17-Oct-1955 DOA: 04/15/2021  PCP: [name], NP  Admit date: 04/15/2021 Discharge date: 04/26/2021  Time spent: 27 minutes  Recommendations for Outpatient Follow-up:  Liberalize diet and allow regular diet for now given he is not taking in enough calories gets intermittently hypoglycemic Will need CBC Chem-12 in about 1 week post discharge Outpatient wound VAC management and wound care per Dr. Sharol Given who should see him postoperatively in his office Outpatient heart failure follow-up as per their orders--I have held [facility] can be resumed cautiously in about 1 week from discharge at lower dose of may be 20 daily Developed acute urinary retention during hospital stay retaining over 1000 cc of urine-Foley was placed and will need outpatient coordination with urology for the same   Discharge Diagnoses:  MAIN problem for hospitalization   Left Charcot foot ulceration status post amputation  Please see below for itemized issues addressed in [place]- refer to other progress notes for clarity if needed  Discharge Condition: Fair  Diet recommendation: Regular for now  [facility] Weights   04/16/21 1958  Weight: 90.7 kg    History of present illness:  65 year old black male community dwelling known COPD, systolic CHF EF 0000000, DM TY 2, HTN, CKD 3B, SVT atrial tachycardia, gout, severe malnutrition Follows Dr. Sharol Given 2/2 to chronic left Charcot foot ulceration-been on doxycycline--scheduled to have BKA 04/19/2021 Started on Rocephin Flagyl vancomycin on admission-ABIs ordered on admission found to have mild AKI, thrush and started on nystatin Cardiology noted moderate risk for surgery but cleared for the same  Awaiting amputation which had to be postponed until 04/21/2021 at patient request  Hospital Course:  Left Charcot foot ulceration status post left transtibial amputation pain control Norco 1-2 tab every 4 as needed   Patient awaiting placement to skilled facility and seems to be improved to the point that he can discharge Severe hypoglycemia secondary to poor p.o. intake since 9/1 Blood sugars intermittently low but patient eating almost no food at times Patient has been liberalized to regular diet--unlikely , but might need work-up for other causes such as insulinoma etc. in the outpatient setting if continues to drop--- if this is the case consider addition of steroids to  to keep his blood sugar up HFrEF chronic without any acute component Entresto restarted 04/22/2021, continue Toprol-XL 25 daily  AKI superimposed on CKD 3B baseline 1.2 Meds adjusted as above--- hold off on Demadex going forward--may re-initiate lower dose after OP labs Chronic gout Continue allopurinol 300 daily COPD without exacerbation Continue Dulera 2 puffs twice daily, Singulair 10 at bedtime, albuterol every 4 as needed for wheeze LUTS, probable BPH Retaining about 1000 cc of urine-we will probably need voiding trial in the outpatient setting Start Flomax 0.4 this admission Oropharyngeal thrush Continue nystatin 4 times and completed therapy prior to discharge SVT atrial tachycardia in the past Continues on beta-blocker   Procedures: Amputation as per Dr. Sharol Given   Consultations: Orthopedics  Discharge Exam: Vitals:   04/26/21 0854 04/26/21 0906  BP: 123/80   Pulse: (!) 103   Resp: 18   Temp: 98.9 F (37.2 C)   SpO2: 91% 94%    Subj on day of d/c   No acute complains, no fever or chills. No chest pain.   EXAM   General: Appear in mild distress, no Rash; Oral Mucosa Clear, moist. no Abnormal Neck Mass Or lumps, Conjunctiva normal  Cardiovascular: S1 and S2 Present, no Murmur, Respiratory: good  respiratory effort, Bilateral Air entry present and CTA, no Crackles, no wheezes Abdomen: Bowel Sound present, Soft and no tenderness, penile ulcer. Extremities: no Pedal edema, left leg AMA Neurology: alert and  oriented to time, place, and person affect appropriate. no new focal deficit Gait not checked due to patient safety concerns   Discharge Instructions   Discharge Instructions     Diet regular   Complete by: As directed    Increase activity slowly   Complete by: As directed    Negative Pressure Wound Therapy - Incisional   Complete by: As directed    Show patient how to attach preveena vac. Should call office if alarms   No wound care   Complete by: As directed       Allergies as of 04/26/2021       Reactions   Ciprofloxacin Rash, Other (See Comments)   SYNCOPE    Shellfish Allergy Anaphylaxis   Sulfa Antibiotics Other (See Comments)   SYNCOPE DIZZINESS         Medication List     STOP taking these medications    allopurinol 300 MG tablet Commonly known as: ZYLOPRIM   Blood Pressure Monitoring Devi   doxycycline 100 MG tablet Commonly known as: VIBRA-TABS   Magnesium Oxide 400 MG Caps   Papaya and Enzymes Chew   torsemide 20 MG tablet Commonly known as: Demadex       TAKE these medications    acetaminophen 325 MG tablet Commonly known as: TYLENOL Take 2 tablets (650 mg total) by mouth every 6 (six) hours as needed for mild pain (or Fever >/= 101).   albuterol 108 (90 Base) MCG/ACT inhaler Commonly known as: Ventolin HFA Inhale 2 puffs into the lungs every 4 (four) hours as needed for wheezing or shortness of breath.   azelastine 0.1 % nasal spray Commonly known as: ASTELIN Place 1-2 sprays into both nostrils 2 (two) times daily as needed (nasal drainage). Use in each nostril as directed   BLUE-EMU HEMP EX Apply 1 application topically daily as needed.   budesonide-formoterol 160-4.5 MCG/ACT inhaler Commonly known as: SYMBICORT USE 2 INHALATIONS TWICE A DAY   Entresto 24-26 MG Generic drug: sacubitril-valsartan Take 1 tablet by mouth 2 (two) times daily.   EPINEPHrine 0.3 mg/0.3 mL Soaj injection Commonly known as: Auvi-Q Inject 0.3 mg  into the muscle as needed for anaphylaxis.   feeding supplement (GLUCERNA SHAKE) Liqd Take 237 mLs by mouth 3 (three) times daily between meals.   fluticasone 50 MCG/ACT nasal spray Commonly known as: FLONASE USE 1 SPRAY IN EACH NOSTRIL TWICE A DAY   HYDROcodone-acetaminophen 5-325 MG tablet Commonly known as: NORCO/VICODIN Take 1-2 tablets by mouth every 6 (six) hours as needed for moderate pain or severe pain. What changed: reasons to take this   LACTAID FAST ACT PO Take 2 tablets by mouth as needed (with dairy food).   metoprolol succinate 25 MG 24 hr tablet Commonly known as: Toprol XL Take 1 tablet (25 mg total) by mouth daily.   Mitigare 0.6 MG Caps Generic drug: Colchicine Take 1 capsule by mouth as needed.   mometasone-formoterol 200-5 MCG/ACT Aero Commonly known as: DULERA Inhale 2 puffs into the lungs daily as needed for wheezing.   montelukast 10 MG tablet Commonly known as: SINGULAIR TAKE 1 TABLET AT BEDTIME   Olopatadine HCl 0.2 % Soln Apply 1 drop to eye daily as needed (itchy/watery eyes).   pantoprazole 40 MG tablet Commonly known as: PROTONIX TAKE ONE  TABLET BY MOUTH 30 TO 60 MINUTES BEFORE YOUR FIRST AND LAST MEALS OF THE DAY What changed: See the new instructions.   polyethylene glycol 17 g packet Commonly known as: MIRALAX / GLYCOLAX Take 17 g by mouth daily as needed for mild constipation.   SPIRULINA PO Take 1 capsule by mouth 2 (two) times daily with a meal.   Super Probiotic Digestive Caps Take 1 tablet by mouth as needed (stomach upset). Take as needed with meals   tamsulosin 0.4 MG Caps capsule Commonly known as: FLOMAX Take 1 capsule (0.4 mg total) by mouth daily after supper.       Allergies  Allergen Reactions   Ciprofloxacin Rash and Other (See Comments)    SYNCOPE    Shellfish Allergy Anaphylaxis   Sulfa Antibiotics Other (See Comments)    SYNCOPE DIZZINESS     Follow-up Information     Suzan Slick, NP Follow up in  1 week(s).   Specialty: Orthopedic Surgery Contact information: [address] [place] [address] [place]. Go on 05/02/2021.   Specialty: Cardiology Why: Tuesday, Sept 13 @ 11AM for HV TOC appt within [place].  Bring all your medications with you.  FREE valet parking at [employer], off [address]. Contact information: [address] [id number] [place] [phone]                 The results of significant diagnostics from this hospitalization (including imaging, microbiology, ancillary and laboratory) are listed below for reference.    Significant Diagnostic Studies: DG Chest 2 View  Result Date: 04/15/2021 CLINICAL DATA:  Shortness of breath and edema. EXAM: CHEST - 2 VIEW COMPARISON:  October 07, 2020 FINDINGS: The heart size and mediastinal contours are within normal limits. Both lungs are clear. The visualized skeletal structures are unremarkable. IMPRESSION: No active cardiopulmonary disease. Electronically Signed   By: Virgina Norfolk M.D.   On: 04/15/2021 17:41   DG Foot 2 Views Left  Result Date: 04/16/2021 CLINICAL DATA:  Recent amputation.  Unable to get off of toilet. EXAM: LEFT FOOT - 2 VIEW COMPARISON:  04/06/2021 FINDINGS: Marked diffuse soft tissue swelling. Status post first and third amputation at the level of the MCP joints. Status post amputation of the second ray at the level of the head the metacarpal bone. Chronic neuropathic changes involving the hindfoot with collapse of the talar dome suspected There is a gas containing wound along the medial aspect of the midfoot overlying the navicular bone with signs of cortical destruction and erosion of the medial cortex of the navicular bone which appear progressive compared with 04/06/2021. IMPRESSION: 1. Findings compatible with progressive osteomyelitis of the medial aspect of the  navicular bone with overlying soft tissue ulceration. 2. Marked diffuse soft tissue swelling. 3. Chronic advanced arthropathic changes of the hindfoot, likely neuropathic. Electronically Signed   By: Kerby Moors M.D.   On: 04/16/2021 05:01   XR Foot 2 Views Left  Result Date: 04/10/2021 2 view radiographs of the left foot shows previous great toe and second ray and third toe amputation with no destructive bony changes through the talonavicular area.   Microbiology: Recent Results (from the past 240 hour(s))  Surgical PCR screen     Status: None   Collection Time: 04/19/21  4:30 AM   Specimen: Nasal Mucosa; Nasal Swab  Result Value Ref Range Status  MRSA, PCR NEGATIVE NEGATIVE Final   Staphylococcus aureus NEGATIVE NEGATIVE Final    Comment: (NOTE) The Xpert SA Assay (FDA approved for NASAL specimens in patients 62 years of age and older), is one component of a comprehensive surveillance program. It is not intended to diagnose infection nor to guide or monitor treatment. Performed at [facility], [place] [address] [place], [place] [address]   SARS CORONAVIRUS 2 (TAT 6-24 HRS) Nasopharyngeal Nasopharyngeal Swab     Status: None   Collection Time: 04/25/21  1:10 PM   Specimen: Nasopharyngeal Swab  Result Value Ref Range Status   SARS Coronavirus 2 NEGATIVE NEGATIVE Final    Comment: (NOTE) SARS-CoV-2 target nucleic acids are NOT DETECTED.  The SARS-CoV-2 RNA is generally detectable in upper and lower respiratory specimens during the acute phase of infection. Negative results do not preclude SARS-CoV-2 infection, do not rule out co-infections with other pathogens, and should not be used as the sole basis for treatment or other patient management decisions. Negative results must be combined with clinical observations, patient history, and epidemiological information. The expected result is Negative.  Fact Sheet for  Patients: SugarRoll.be  Fact Sheet for Healthcare Providers: https://www.woods-mathews.com/  This test is not yet approved or cleared by the [place] FDA and  has been authorized for detection and/or diagnosis of SARS-CoV-2 by FDA under an Emergency Use Authorization (EUA). This EUA will remain  in effect (meaning this test can be used) for the duration of the COVID-19 declaration under Se ction 564(b)(1) of the Act, 21 U.S.C. section 360bbb-3(b)(1), unless the authorization is terminated or revoked sooner.  Performed at [facility], [place] [address] [place], [place] [address]      Labs: Basic Metabolic Panel: Recent Labs  Lab 04/20/21 0242 04/21/21 0024 04/22/21 0047 04/23/21 0215 04/25/21 0054 04/25/21 2128  NA 139 136 137 136 137  --   K 3.7 3.4* 3.6 3.4* 3.3*  --   CL 105 103 102 101 104  --   CO2 '23 23 25 23 24  '$ --   GLUCOSE 85 130* 125* 111* 95 105*  BUN 36* 36* 39* 48* 57*  --   CREATININE 1.54* 1.73* 2.05* 2.22* 2.27*  --   CALCIUM 8.4* 8.2* 8.3* 8.3* 8.1*  --   PHOS  --  3.4  --   --   --   --     Liver Function Tests: Recent Labs  Lab 04/20/21 0242 04/21/21 0024 04/22/21 0047  AST 44*  --  41  ALT 28  --  23  ALKPHOS 83  --  88  BILITOT 0.9  --  0.5  PROT 6.1*  --  6.3*  ALBUMIN 1.8* 1.8* 1.9*    No results for input(s): LIPASE, AMYLASE in the last 168 hours. No results for input(s): AMMONIA in the last 168 hours. CBC: Recent Labs  Lab 04/21/21 0024 04/22/21 0047 04/23/21 0215 04/25/21 0054  WBC 5.6 8.2 9.9 5.9  NEUTROABS 4.5 7.4  --  5.1  HGB 12.5* 14.1 13.8 12.5*  HCT 39.3 44.4 43.2 38.4*  MCV 84.7 85.9 85.0 84.8  PLT 387 392 418* 305    Cardiac Enzymes: No results for input(s): CKTOTAL, CKMB, CKMBINDEX, TROPONINI in the last 168 hours. BNP: BNP (last 3 results) Recent Labs    01/31/21 2038 04/15/21 1714  BNP >4,500.0* >4,500.0*     ProBNP (last 3 results) Recent Labs     10/24/20 1517 01/24/21 1106 02/22/21 1158  PROBNP 7,418* [id number]* 20,322*     CBG: Recent Labs  Lab 04/25/21 1727 04/25/21 1729 04/25/21 1831 04/26/21 0853 04/26/21 1227  GLUCAP 26* 45* 26* 111* 80        Signed:  Berle Mull MD   [facility] 04/26/2021, 1:19 PM

## 2021-04-26 NOTE — Progress Notes (Signed)
Called [facility] to give report, gave Biomedical engineer phone number for RN to call back.

## 2021-04-26 NOTE — Progress Notes (Signed)
[facility] PROGRESS NOTE  Patient: Stephen Whitaker [id number]   PCP: [name], NP DOB: [birth year]   DOA: 04/15/2021   DOS: 04/26/2021    Assessment and plan: Patient remains hypoglycemic. 30 minutes later when blood draw was performed and his sugars were 100, still on lower side. Check further work-up in the hospital. Continue to monitor for improvement in oral intake. Do not think the steroids is the right treatment of the patient just recently had surgery. Hypokalemia, hypomagnesemia, hypophosphatemia, we will replace aggressively and monitor and recheck.  Mild elevation of LFT. Will monitor. AKI currently improving.  Author: Berle Mull, MD [facility] 04/26/2021 8:06 PM   If 7PM-7AM, please contact night-coverage at [email or web address]

## 2021-04-26 NOTE — TOC Transition Note (Signed)
Transition of Care [facility]) - CM/SW Discharge Note   Patient Details  Name: Stephen Whitaker MRN: [id number] Date of Birth: [birth year]  Transition [facility]) CM/SW Contact:  Emeterio Reeve, LCSW Phone Number: 04/26/2021, 12:50 PM   Clinical Narrative:     Patient will DC to: [facility] Anticipated DC date: 04/26/21 Family notified: none Transport by: Corey Harold     Per MD patient ready for DC to [facility]. RN, patient, patient's family, and facility notified of DC. Discharge Summary and FL2 sent to facility. DC packet on chart. Insurance Josem Kaufmann has been received and pt is covid negative. Ambulance transport requested for patient.    RN to call report to   CSW will sign off for now as social work intervention is no longer needed. Please consult [place] again if new needs arise.   Final next level of care: Homeless Shelter Barriers to Discharge: Barriers Resolved   Patient Goals and CMS Choice Patient states their goals for this hospitalization and ongoing recovery are:: to get better CMS Medicare.gov Compare Post Acute Care list provided to:: Patient Choice offered to / list presented to : Patient  Discharge Placement              Patient chooses bed at: Other - please specify in the comment section below: ([facility]) Patient to be transferred to facility by: ptar Name of family member notified: pt declined csw calling family Patient and family notified of of transfer: 04/26/21  Discharge Plan and Services     Post Acute Care Choice: [place]                               Social Determinants of Health (SDOH) Interventions Food Insecurity Interventions: Intervention Not Indicated Financial Strain Interventions: Other (Comment) (CSW consulted) Housing Interventions: Other (Comment) (air ducts have mold per pt statement.) Transportation Interventions: Intervention Not Indicated   Readmission Risk Interventions No flowsheet data found.

## 2021-04-26 NOTE — Progress Notes (Signed)
Heart Failure Nurse Navigator Progress Note  Spoke with patient at bedside regarding follow up appt with HV TOC clinic after discharge to [facility] today. Pt vaguely recalled speaking with this author earlier in admission. Educated on plan for follow up appts with HV TOC then to Dr. Gasper Sells for cardiology and Dr. Sharol Given for his L BKA/wound.   Pt biggest concerns regarding a working cell phone. Plan will be to give at [facility] clinic as the trac phones are on order at present.   Navigator spoke with admissions nurse at [facility] to confirm HV TOC appt 9/13 @ 11AM and transportation via [facility] is confirmed.   Pt awaiting DC.   Pricilla Holm, MSN, RN Heart Failure Nurse Navigator [phone]

## 2021-04-27 ENCOUNTER — Telehealth: Payer: Self-pay | Admitting: Internal Medicine

## 2021-04-27 ENCOUNTER — Inpatient Hospital Stay (HOSPITAL_COMMUNITY): Payer: BC Managed Care – PPO

## 2021-04-27 LAB — BASIC METABOLIC PANEL
Anion gap: 7 (ref 5–15)
BUN: 29 mg/dL — ABNORMAL HIGH (ref 8–23)
CO2: 22 mmol/L (ref 22–32)
Calcium: 7.7 mg/dL — ABNORMAL LOW (ref 8.9–10.3)
Chloride: 107 mmol/L (ref 98–111)
Creatinine, Ser: 1.1 mg/dL (ref 0.61–1.24)
GFR, Estimated: >60 mL/min (ref >60)
Glucose, Bld: 117 mg/dL — ABNORMAL HIGH (ref 70–99)
Potassium: 3.3 mmol/L — ABNORMAL LOW (ref 3.5–5.1)
Sodium: 136 mmol/L (ref 135–145)

## 2021-04-27 LAB — COMPREHENSIVE METABOLIC PANEL
ALT: 27 U/L (ref 0–44)
AST: 56 U/L — ABNORMAL HIGH (ref 15–41)
Albumin: 1.7 g/dL — ABNORMAL LOW (ref 3.5–5.0)
Alkaline Phosphatase: 130 U/L — ABNORMAL HIGH (ref 38–126)
Anion gap: 6 (ref 5–15)
BUN: 35 mg/dL — ABNORMAL HIGH (ref 8–23)
CO2: 25 mmol/L (ref 22–32)
Calcium: 7.7 mg/dL — ABNORMAL LOW (ref 8.9–10.3)
Chloride: 108 mmol/L (ref 98–111)
Creatinine, Ser: 1.08 mg/dL (ref 0.61–1.24)
GFR, Estimated: >60 mL/min (ref >60)
Glucose, Bld: 86 mg/dL (ref 70–99)
Potassium: 3.2 mmol/L — ABNORMAL LOW (ref 3.5–5.1)
Sodium: 139 mmol/L (ref 135–145)
Total Bilirubin: 0.4 mg/dL (ref 0.3–1.2)
Total Protein: 5.8 g/dL — ABNORMAL LOW (ref 6.5–8.1)

## 2021-04-27 LAB — GLUCOSE, CAPILLARY
Glucose-Capillary: 136 mg/dL — ABNORMAL HIGH (ref 70–99)
Glucose-Capillary: 91 mg/dL (ref 70–99)
Glucose-Capillary: 61 mg/dL — ABNORMAL LOW (ref 70–99)
Glucose-Capillary: 63 mg/dL — ABNORMAL LOW (ref 70–99)
Glucose-Capillary: 25 mg/dL — CL (ref 70–99)
Glucose-Capillary: 52 mg/dL — ABNORMAL LOW (ref 70–99)
Glucose-Capillary: 52 mg/dL — ABNORMAL LOW (ref 70–99)
Glucose-Capillary: 100 mg/dL — ABNORMAL HIGH (ref 70–99)

## 2021-04-27 LAB — INSULIN, RANDOM: Insulin: 21.6 u[IU]/mL (ref 2.6–24.9)

## 2021-04-27 LAB — MAGNESIUM: Magnesium: 1.6 mg/dL — ABNORMAL LOW (ref 1.7–2.4)

## 2021-04-27 LAB — BETA-HYDROXYBUTYRIC ACID: Beta-Hydroxybutyric Acid: 0.13 mmol/L (ref 0.05–0.27)

## 2021-04-27 LAB — C-PEPTIDE: C-Peptide: 11.6 ng/mL — ABNORMAL HIGH (ref 1.1–4.4)

## 2021-04-27 MED ORDER — IOHEXOL 9 MG/ML PO SOLN
500.0000 mL | ORAL | Status: AC
  Administered 2021-04-27 (×2): 500 mL via ORAL

## 2021-04-27 MED ORDER — DEXTROSE 50 % IV SOLN
INTRAVENOUS | Status: AC
  Administered 2021-04-27: 25 mL
  Filled 2021-04-27: qty 50

## 2021-04-27 MED ORDER — IOHEXOL 350 MG/ML SOLN
80.0000 mL | Freq: Once | INTRAVENOUS | Status: AC | PRN
  Administered 2021-04-27: 80 mL via INTRAVENOUS

## 2021-04-27 MED ORDER — POTASSIUM CHLORIDE CRYS ER 20 MEQ PO TBCR
40.0000 meq | EXTENDED_RELEASE_TABLET | Freq: Once | ORAL | Status: AC
  Administered 2021-04-27: 40 meq via ORAL
  Filled 2021-04-27: qty 2

## 2021-04-27 MED ORDER — MAGNESIUM SULFATE 2 GM/50ML IV SOLN
2.0000 g | Freq: Once | INTRAVENOUS | Status: AC
  Administered 2021-04-27: 2 g via INTRAVENOUS
  Filled 2021-04-27: qty 50

## 2021-04-27 NOTE — Progress Notes (Signed)
Called pt's son to update him. Let him know about blood sugars today, need for K+ and MG supplements today, and prepping pt for CT later tonight.  He would like MD and Social Worker to call him tomorrow with updates. He is also asking about a mental health evaluation for his father. He stated his father "never tells him all of what's going on" and he wants to know the whole picture--when he will be d/c, where he will go for rehab, how long he will be there, where he is scheduled to go afterward.  Son:  Shahram Frew [phone] (listed on chart)

## 2021-04-27 NOTE — Social Work (Signed)
Pt will not DC today. CSW informed facility.   Emeterio Reeve, LCSW Clinical Social Worker

## 2021-04-27 NOTE — Progress Notes (Addendum)
Hypoglycemic Event  CBG: 61  Treatment: 8 oz juice/soda  Symptoms: None  Follow-up CBG Time:1952 CBG Result:52  Follow up 2010 CBG 52  Possible Reasons for Event: Inadequate meal intake  Comments/MD notified:Gave 8 oz OJ. Follow-up CBG 52. Gave grape juice and graham crackers. CBG 52. Gave 25 mL of D50. CBG 25. Called MD. Pt asymptomatic, refused anymore CBG sticks.  See new orders.   Dana Allan

## 2021-04-27 NOTE — Progress Notes (Signed)
PROGRESS NOTE   Stephen Whitaker  [id number] DOB: [birth year] DOA: 04/15/2021 PCP: [name], NP  Brief Narrative:   65 year old black male community dwelling known COPD, systolic CHF EF 0000000, DM TY 2, HTN, CKD 3B, SVT atrial tachycardia, gout, severe malnutrition Follows Dr. Sharol Given 2/2 to chronic left Charcot foot ulceration-been on doxycycline--scheduled to have BKA 04/19/2021 Started on Rocephin Flagyl vancomycin on admission-ABIs ordered on admission found to have mild AKI, thrush and started on nystatin Cardiology noted moderate risk for surgery but cleared for the same  Awaiting amputation which had to be postponed until 04/21/2021 at patient request  Hospital-Problem based course  Left Charcot foot ulceration status post pain control Norco 1-2 tab every 4 as needed moderate pain, morphine 2 mg every 2 as needed severe pain--adjust pain meds as needed over the next several days Perioperative antibiotics have been completed  Severe hypoglycemia secondary to poor p.o. intake Blood sugars 80s to 150s but not eating much.  C-peptide elevated insulin level normal.  Will initiate further work-up with a CT scan of the abdomen.  May require dextrose infusion.  HFrEF chronic without any acute component Entresto restarted 04/22/2021, continue Toprol-XL 25 daily  AKI superimposed on CKD 3B baseline 1.2 Meds adjusted as above--- hold off on Demadex going forward, labs in a.m. Chronic gout Continue allopurinol 300 daily COPD without exacerbation Continue Dulera 2 puffs twice daily, Singulair 10 at bedtime, albuterol every 4 as needed for wheeze LUTS, probable BPH Retaining about 1000 cc of urine-we will probably need voiding trial in the outpatient setting Start Flomax 0.4 Oropharyngeal thrush Continue nystatin 4 times daily started on 8/28, finish at least 10 days in the outpatient setting SVT atrial tachycardia in the past Continues on beta-blocker    DVT prophylaxis:  SCD Code Status: Full Family Communication: None at bedside Disposition:  Status is: Inpatient  Consultants:  Orthopedics  Procedures:   Antimicrobials: As above   Subjective:  No nausea no vomiting but no fever no chills.  Intermittent diarrhea currently resolved.  No abdominal pain.   Objective: Vitals:   04/27/21 0533 04/27/21 0802 04/27/21 0922 04/27/21 1714  BP: 113/87 131/89 112/82 107/86  Pulse: (!) 104 (!) 111 (!) 105 92  Resp: '18 18 18 19  '$ Temp: 98.9 F (37.2 C) 98.5 F (36.9 C) 98.3 F (36.8 C) (!) 97.5 F (36.4 C)  TempSrc: Oral Oral Oral Oral  SpO2: 98% 99% 100% 98%  Weight:      Height:        Intake/Output Summary (Last 24 hours) at 04/27/2021 1826 Last data filed at 04/27/2021 1754 Gross per 24 hour  Intake 1382.6 ml  Output 2000 ml  Net -617.4 ml    Filed Weights   04/16/21 1958  Weight: 90.7 kg    Examination:  General: Appear in mild distress, no Rash; Oral Mucosa Clear, moist. no Abnormal Neck Mass Or lumps, Conjunctiva normal  Cardiovascular: S1 and S2 Present, no Murmur, Respiratory: good respiratory effort, Bilateral Air entry present and CTA, no Crackles, no wheezes Abdomen: Bowel Sound present, Soft and no tenderness Extremities: no Pedal edema, left AKA Neurology: alert and oriented to time, place, and person affect appropriate. no new focal deficit Gait not checked due to patient safety concerns   Data Reviewed: personally reviewed   CBC    Component Value Date/Time   WBC 5.9 04/25/2021 0054   RBC 4.53 04/25/2021 0054   HGB 12.5 (L) 04/25/2021 0054   HGB 11.6 (L)  12/08/2020 1636   HGB 12.4 (L) 08/08/2010 1010   HCT 38.4 (L) 04/25/2021 0054   HCT 36.7 (L) 12/08/2020 1636   HCT 37.5 (L) 08/08/2010 1010   PLT 305 04/25/2021 0054   PLT 275 12/08/2020 1636   MCV 84.8 04/25/2021 0054   MCV 84 12/08/2020 1636   MCV 84.3 08/08/2010 1010   MCH 27.6 04/25/2021 0054   MCHC 32.6 04/25/2021 0054   RDW 17.8 (H) 04/25/2021 0054    RDW 13.7 12/08/2020 1636   RDW 13.9 08/08/2010 1010   LYMPHSABS 0.3 (L) 04/25/2021 0054   LYMPHSABS 0.9 06/26/2019 1035   LYMPHSABS 1.0 08/08/2010 1010   MONOABS 0.4 04/25/2021 0054   MONOABS 0.5 08/08/2010 1010   EOSABS 0.0 04/25/2021 0054   EOSABS 0.1 06/26/2019 1035   BASOSABS 0.0 04/25/2021 0054   BASOSABS 0.0 06/26/2019 1035   BASOSABS 0.0 08/08/2010 1010   CMP Latest Ref Rng & Units 04/27/2021 04/26/2021 04/25/2021  Glucose 70 - 99 mg/dL 86 101(H) 105(H)  BUN 8 - 23 mg/dL 35(H) 42(H) -  Creatinine 0.61 - 1.24 mg/dL 1.08 1.31(H) -  Sodium 135 - 145 mmol/L 139 140 -  Potassium 3.5 - 5.1 mmol/L 3.2(L) 2.8(L) -  Chloride 98 - 111 mmol/L 108 107 -  CO2 22 - 32 mmol/L 25 26 -  Calcium 8.9 - 10.3 mg/dL 7.7(L) 8.0(L) -  Total Protein 6.5 - 8.1 g/dL 5.8(L) 5.9(L) -  Total Bilirubin 0.3 - 1.2 mg/dL 0.4 0.6 -  Alkaline Phos 38 - 126 U/L 130(H) 170(H) -  AST 15 - 41 U/L 56(H) 74(H) -  ALT 0 - 44 U/L 27 28 -     Radiology Studies: No results found.   Scheduled Meds:  (feeding supplement) PROSource Plus  30 mL Oral TID BM   allopurinol  300 mg Oral Daily   vitamin C  1,000 mg Oral Daily   Chlorhexidine Gluconate Cloth  6 each Topical Daily   feeding supplement (GLUCERNA SHAKE)  237 mL Oral TID BM   fluticasone  1 spray Each Nare BID   lactase  6,000 Units Oral TID WC   loratadine  10 mg Oral Daily   metoprolol succinate  25 mg Oral Daily   mometasone-formoterol  2 puff Inhalation BID   montelukast  10 mg Oral QHS   multivitamin with minerals  1 tablet Oral Daily   nystatin  5 mL Oral QID   olopatadine  1 drop Both Eyes BID   pantoprazole  40 mg Oral Daily   sacubitril-valsartan  1 tablet Oral BID   tamsulosin  0.4 mg Oral QPC supper   zinc sulfate  220 mg Oral Daily   Continuous Infusions:  magnesium sulfate bolus IVPB 2 g (04/27/21 1732)      LOS: 11 days   Time spent: 7  Berle Mull, MD [facility] To contact the attending provider between 7A-7P or the  covering provider during after hours 7P-7A, please log into the web site [email or web address] and access using universal [place] password for that web site. If you do not have the password, please call the hospital operator.  04/27/2021, 6:26 PM

## 2021-04-27 NOTE — Telephone Encounter (Signed)
Returned call to son.  He is trying to speak with his father's nurse at [facility].  He states he has been speaking with his father, but not sure he is getting the "whole story" in regards to his father's health status.  Advised that his father's nurse would call him back as soon as they are able.  If he did not receive a call back he could call this evening and ask to speak with front desk staff.  Advised him to ask nurse to have hospitalist and social worker call him tomorrow when they are rounding to bring him up to date on father's care and he can ask questions.  Son indicates understanding.

## 2021-04-27 NOTE — Progress Notes (Signed)
    OVERNIGHT PROGRESS REPORT  Notified by RN that transport is available for patient but patient is still receiving treatment for electrolyte abnormalities by currently running infusion(s).  As of 04/26/2021 2 1729 K = 2.8, Phosphorus is 1.9, and Magnesium is 1.5  Follow up Lab work is ordered to evaluate for transfer later in the morning     Gershon Cull MSNA MSN [place]

## 2021-04-27 NOTE — Telephone Encounter (Signed)
Patient's son called asking to speak with Dr Altamese Dilling in regards to his dad being in the hospital. He has questions and concerns that he would like to have address. Please advise

## 2021-04-28 LAB — GLUCOSE, CAPILLARY
Glucose-Capillary: 113 mg/dL — ABNORMAL HIGH (ref 70–99)
Glucose-Capillary: 139 mg/dL — ABNORMAL HIGH (ref 70–99)
Glucose-Capillary: 69 mg/dL — ABNORMAL LOW (ref 70–99)
Glucose-Capillary: 82 mg/dL (ref 70–99)
Glucose-Capillary: 95 mg/dL (ref 70–99)

## 2021-04-28 LAB — INSULIN-LIKE GROWTH FACTOR: Somatomedin C: 86 ng/mL (ref 59–230)

## 2021-04-28 MED ORDER — MORPHINE SULFATE (PF) 2 MG/ML IV SOLN
2.0000 mg | Freq: Once | INTRAVENOUS | Status: AC
Start: 2021-04-28 — End: 2021-04-28
  Administered 2021-04-28: 2 mg via INTRAVENOUS
  Filled 2021-04-28: qty 1

## 2021-04-28 MED ORDER — ALBUMIN HUMAN 25 % IV SOLN
12.5000 g | Freq: Once | INTRAVENOUS | Status: AC
  Administered 2021-04-28: 12.5 g via INTRAVENOUS
  Filled 2021-04-28: qty 50

## 2021-04-28 MED ORDER — FUROSEMIDE 10 MG/ML IJ SOLN
20.0000 mg | Freq: Once | INTRAMUSCULAR | Status: AC
  Administered 2021-04-28: 20 mg via INTRAVENOUS
  Filled 2021-04-28: qty 2

## 2021-04-28 NOTE — Progress Notes (Signed)
PROGRESS NOTE   Stephen Whitaker  [id number] DOB: 1956/02/15 DOA: 04/15/2021 PCP: [name], NP  Brief Narrative:   65 year old black male community dwelling known COPD, systolic CHF EF 0000000, DM TY 2, HTN, CKD 3B, SVT atrial tachycardia, gout, severe malnutrition Follows Dr. Sharol Given 2/2 to chronic left Charcot foot ulceration-been on doxycycline--scheduled to have BKA 04/19/2021 Started on Rocephin Flagyl vancomycin on admission-ABIs ordered on admission found to have mild AKI, thrush and started on nystatin Cardiology noted moderate risk for surgery but cleared for the same  Awaiting amputation which had to be postponed until 04/21/2021 at patient request  Hospital-Problem based course  Left Charcot foot ulceration status post amputation pain control Norco 1-2 tab every 4 as needed moderate pain, Perioperative antibiotics have been completed  Severe hypoglycemia secondary to poor p.o. intake Blood sugars running on the lower side. C-peptide elevated insulin level normal.  CT abdomen negative for any neuroendocrine tumor.  Discussed with endocrine.  Given that the patient does not have any symptoms-does not have varicose chronic triad possibly patient may have poor circulation causing hypoglycemic results on POCT labs.  Recommendation is to check CBG only if the patient is symptomatic.  And verified stat with venous sample.  HFrEF chronic without any acute component Anasarca secondary to hypoalbuminemia and third spacing Entresto restarted 04/22/2021, continue Toprol-XL 25 daily CT abdomen shows evidence of bilateral pleural effusion, anasarca patient also has scrotal edema. This is all likely secondary to hypoalbuminemia from poor p.o. intake. Patient will benefit from IV Lasix and albumin therapy.  AKI superimposed on CKD 3B baseline 1.2 Meds adjusted as above--- hold off on Demadex going forward, labs in a.m. Chronic gout Continue allopurinol 300 daily COPD without  exacerbation Continue Dulera 2 puffs twice daily, Singulair 10 at bedtime, albuterol every 4 as needed for wheeze LUTS, probable BPH Retaining about 1000 cc of urine-we will probably need voiding trial in the outpatient setting Start Flomax 0.4 Oropharyngeal thrush Continue nystatin 4 times daily started on 8/28, finish at least 10 days in the outpatient setting SVT atrial tachycardia in the past Continues on beta-blocker    DVT prophylaxis: SCD Code Status: Full Family Communication: None at bedside Disposition:  Status is: Inpatient  Consultants:  Orthopedics  Procedures:   Antimicrobials: As above   Subjective:  No acute complaint.  No nausea no vomiting but no fever no chills.  No chest pain.  Continues to have scrotal swelling.   Objective: Vitals:   04/28/21 0817 04/28/21 0832 04/28/21 1650 04/28/21 1943  BP:  119/90 (!) 129/95   Pulse:  (!) 106 (!) 110   Resp:  19 18   Temp:  97.9 F (36.6 C) 98.2 F (36.8 C)   TempSrc:  Oral Oral   SpO2: 98% 100% 97% 95%  Weight:      Height:        Intake/Output Summary (Last 24 hours) at 04/28/2021 2022 Last data filed at 04/28/2021 1731 Gross per 24 hour  Intake 480 ml  Output 1000 ml  Net -520 ml    Filed Weights   04/16/21 1958  Weight: 90.7 kg    Examination:  General: Appear in mild distress, no Rash; Oral Mucosa Clear, moist. no Abnormal Neck Mass Or lumps, Conjunctiva normal  Cardiovascular: S1 and S2 Present, no Murmur, Respiratory: good respiratory effort, Bilateral Air entry present and CTA, no Crackles, no wheezes Abdomen: Bowel Sound present, Soft and no tenderness Extremities: no Pedal edema, left BKA Neurology: alert  and oriented to time, place, and person affect appropriate. no new focal deficit Gait not checked due to patient safety concerns   Data Reviewed: personally reviewed   CBC    Component Value Date/Time   WBC 5.9 04/25/2021 0054   RBC 4.53 04/25/2021 0054   HGB 12.5 (L)  04/25/2021 0054   HGB 11.6 (L) 12/08/2020 1636   HGB 12.4 (L) 08/08/2010 1010   HCT 38.4 (L) 04/25/2021 0054   HCT 36.7 (L) 12/08/2020 1636   HCT 37.5 (L) 08/08/2010 1010   PLT 305 04/25/2021 0054   PLT 275 12/08/2020 1636   MCV 84.8 04/25/2021 0054   MCV 84 12/08/2020 1636   MCV 84.3 08/08/2010 1010   MCH 27.6 04/25/2021 0054   MCHC 32.6 04/25/2021 0054   RDW 17.8 (H) 04/25/2021 0054   RDW 13.7 12/08/2020 1636   RDW 13.9 08/08/2010 1010   LYMPHSABS 0.3 (L) 04/25/2021 0054   LYMPHSABS 0.9 06/26/2019 1035   LYMPHSABS 1.0 08/08/2010 1010   MONOABS 0.4 04/25/2021 0054   MONOABS 0.5 08/08/2010 1010   EOSABS 0.0 04/25/2021 0054   EOSABS 0.1 06/26/2019 1035   BASOSABS 0.0 04/25/2021 0054   BASOSABS 0.0 06/26/2019 1035   BASOSABS 0.0 08/08/2010 1010   CMP Latest Ref Rng & Units 04/27/2021 04/27/2021 04/26/2021  Glucose 70 - 99 mg/dL 117(H) 86 101(H)  BUN 8 - 23 mg/dL 29(H) 35(H) 42(H)  Creatinine 0.61 - 1.24 mg/dL 1.10 1.08 1.31(H)  Sodium 135 - 145 mmol/L 136 139 140  Potassium 3.5 - 5.1 mmol/L 3.3(L) 3.2(L) 2.8(L)  Chloride 98 - 111 mmol/L 107 108 107  CO2 22 - 32 mmol/L '22 25 26  '$ Calcium 8.9 - 10.3 mg/dL 7.7(L) 7.7(L) 8.0(L)  Total Protein 6.5 - 8.1 g/dL - 5.8(L) 5.9(L)  Total Bilirubin 0.3 - 1.2 mg/dL - 0.4 0.6  Alkaline Phos 38 - 126 U/L - 130(H) 170(H)  AST 15 - 41 U/L - 56(H) 74(H)  ALT 0 - 44 U/L - 27 28     Radiology Studies: CT ABDOMEN PELVIS W CONTRAST  Result Date: 04/27/2021 CLINICAL DATA:  Diarrhea c peptide elevated with hypoglycemia and diarrhea, rule out intra-abdominal neuroendrocrine lesion EXAM: CT ABDOMEN AND PELVIS WITH CONTRAST TECHNIQUE: Multidetector CT imaging of the abdomen and pelvis was performed using the standard protocol following bolus administration of intravenous contrast. CONTRAST:  14m OMNIPAQUE IOHEXOL 350 MG/ML SOLN COMPARISON:  CT 01/21/2015 FINDINGS: Lower chest: Bilateral pleural effusions are partially included, at least moderate in size.  Associated compressive atelectasis. Mild cardiomegaly. Hepatobiliary: No focal hepatic abnormality. There is mild periportal edema. Gallbladder is nondistended, no calcified gallstones. No biliary dilatation. Pancreas: Homogeneous enhancement. No evidence of pancreatic mass. No ductal dilatation. No focal peripancreatic fat stranding or inflammation. Spleen: Wedge-shaped hypodensity in the posterior upper spleen typical of splenic infarct. No splenomegaly. Adrenals/Urinary Tract: Normal adrenal glands. No hydronephrosis. A least 3 right renal cysts. No solid renal lesion. Homogeneous enhancement with symmetric excretion on delayed phase imaging. No renal calculi. There is a Foley catheter within the urinary bladder which is partially distended. Diffuse circumferential bladder wall thickening greater than typically seen with nondistention. Stomach/Bowel: The stomach is decompressed. Administered enteric contrast reaches the colon, there is no bowel obstruction. No small bowel wall thickening or inflammation. No evidence of small bowel mass. The appendix is normal, courses into the central abdomen. Contrast within the ascending and transverse colon. Fluid and liquid stool within the descending and sigmoid colon. Minimal rectal wall thickening without additional  colonic wall thickening. No definite pericolonic inflammation. Vascular/Lymphatic: Aortic atherosclerosis. No aortic aneurysm. Patent portal vein. Few prominent external iliac lymph nodes measuring 8 mm short axis, not enlarged by size criteria. No enlarged lymph nodes in the abdomen or pelvis. Reproductive: Prostate is unremarkable. Other: Prominent anasarca and body wall edema with skin thickening. Similar edema of the intra-abdominal fat. Small volume abdominopelvic ascites. No free air. There is no evidence of mesenteric mass. Musculoskeletal: Degenerative disc disease and facet hypertrophy at L4-L5. There are no acute or suspicious osseous abnormalities.  IMPRESSION: 1. No evidence of neuroendocrine lesion. 2. Prominent anasarca and body wall edema. Small volume abdominopelvic ascites. Findings consistent with third-spacing. 3. Diffuse circumferential bladder wall thickening greater than typically seen with nondistention. This may be due to chronic bladder outlet obstruction, however recommend correlation with urinalysis to exclude urinary tract infection. 4. Small splenic infarct. 5. Bilateral pleural effusions are partially included, at least moderate in size. Associated compressive atelectasis. 6. Fluid and liquid stool within the descending and sigmoid colon, consistent with provided history of diarrhea. Minimal rectal wall thickening without additional colonic wall thickening. Aortic Atherosclerosis (ICD10-I70.0). Electronically Signed   By: Keith Rake M.D.   On: 04/27/2021 23:47     Scheduled Meds:  (feeding supplement) PROSource Plus  30 mL Oral TID BM   allopurinol  300 mg Oral Daily   vitamin C  1,000 mg Oral Daily   Chlorhexidine Gluconate Cloth  6 each Topical Daily   feeding supplement (GLUCERNA SHAKE)  237 mL Oral TID BM   fluticasone  1 spray Each Nare BID   lactase  6,000 Units Oral TID WC   loratadine  10 mg Oral Daily   metoprolol succinate  25 mg Oral Daily   mometasone-formoterol  2 puff Inhalation BID   montelukast  10 mg Oral QHS   multivitamin with minerals  1 tablet Oral Daily   nystatin  5 mL Oral QID   olopatadine  1 drop Both Eyes BID   pantoprazole  40 mg Oral Daily   sacubitril-valsartan  1 tablet Oral BID   tamsulosin  0.4 mg Oral QPC supper   zinc sulfate  220 mg Oral Daily   Continuous Infusions:      LOS: 12 days   Time spent: 7  Berle Mull, MD [facility] To contact the attending provider between 7A-7P or the covering provider during after hours 7P-7A, please log into the web site [email or web address] and access using universal [place] password for that web site. If you do not have the  password, please call the hospital operator.  04/28/2021, 8:22 PM

## 2021-04-28 NOTE — Progress Notes (Addendum)
Pt snacking throughout the afternoon and evening per MD conversation. He was sitting up and smiling at end of shift.  No s/s of glycemic issues.  Pt does not drink the glucerna very often, tends to "save it for later" and he is getting a collection on the side table.  Wound vac removed this morning by PA.  Recommend pre-medicating pt before doing anything with his left leg.

## 2021-04-28 NOTE — Progress Notes (Signed)
Notified MD of CBG 69. Waiting on MD response d/t CBG dropping lower with sugar interventions yesterday.

## 2021-04-28 NOTE — TOC Progression Note (Signed)
Transition of Care [facility]) - Progression Note    Patient Details  Name: Stephen Whitaker MRN: [id number] Date of Birth: Oct 24, 1955  [facility] of [facility] CM/SW Contact  Emeterio Reeve, [place] Phone Number: 04/28/2021, 1:31 PM  Clinical Narrative:     Pt not stable for DC today. CSW informed facility.   Expected Discharge Plan: Skilled Nursing Facility Barriers to Discharge: Barriers Resolved  Expected Discharge Plan and Services Expected Discharge Plan: [place] Choice: [place] Living arrangements for the past 2 months: Apartment Expected Discharge Date: 04/25/21                                     Social Determinants of Health (SDOH) Interventions Food Insecurity Interventions: Intervention Not Indicated Financial Strain Interventions: Other (Comment) (CSW consulted) Housing Interventions: Other (Comment) (air ducts have mold per pt statement.) Transportation Interventions: Intervention Not Indicated  Readmission Risk Interventions No flowsheet data found.

## 2021-04-28 NOTE — Progress Notes (Addendum)
OT Cancellation Note  Patient Details Name: Stephen Whitaker MRN: [id number] DOB: 1955-11-15   Cancelled Treatment:    Reason Eval/Treat Not Completed: Pain limiting ability to participate.  Rn met with me prior to tx. Attemtps and requests hold on skilled therapy this a.m.  secondary to severe pain following dressing changes. will attempt back as able.    Clearnce Sorrel [place]-COTA/L 04/28/2021, 9:50 AM

## 2021-04-28 NOTE — Progress Notes (Signed)
Physical Therapy Treatment Patient Details Name: Stephen Whitaker MRN: [id number] DOB: 20-Oct-1955 Today's Date: 04/28/2021    History of Present Illness Stephen Whitaker is a 65 y.o.-year-old male who presented with osteomyelitis abscess and ulceration of the left heel. Underwent Lt transtibial amputation 04/21/21 with wound vac (admitted 04/15/21), post op having difficulties with hypoglycemia.  Pt with significant PMH of COPD, gout, HTN, mild mitral regurgitation, R 2nd toe amputation, multiple L LE amputations, CKD 3B, SVT.    PT Comments    Patient able to attempt sit to stand today at EOB after much time to get medications and for hygiene due to incontinent stool.  Patient slow to move with pain exacerbated earlier this am with dressing change.  Seems eager, however to mobilize despite pain.  Appropriate for SNF level rehab at d/c.  Did complain of dizziness at EOB, but BP stable and MD made aware.  Continue skilled PT until d/c.   Follow Up Recommendations  SNF     Equipment Recommendations  Wheelchair (measurements PT);Wheelchair cushion (measurements PT);3in1 (PT);Hospital bed    Recommendations for Other Services       Precautions / Restrictions Precautions Precautions: Fall Required Braces or Orthoses: Other Brace Splint/Cast: L limb protector Restrictions LLE Weight Bearing: Non weight bearing    Mobility  Bed Mobility Overal bed mobility: Needs Assistance Bed Mobility: Supine to Sit;Sit to Supine;Rolling Rolling: Mod assist   Supine to sit: HOB elevated;Mod assist Sit to supine: Mod assist   General bed mobility comments: assist for trunk and guiding legs off bed, pt pulling up on rail, multiple repositioning attempts to get scooted out to EOB; to supine assist for L LE onto bed and R LE onto bed, pt scooting hips on his own    Transfers Overall transfer level: Needs assistance Equipment used: Rolling walker (2 wheeled) Transfers: Sit to/from Stand Sit to Stand: Max  assist;From elevated surface         General transfer comment: unable to get up on R LE to stand with RW, but attempted with max A x 5 reps for technique & strengthening  Ambulation/Gait                 Stairs             Wheelchair Mobility    Modified Rankin (Stroke Patients Only)       Balance Overall balance assessment: Needs assistance Sitting-balance support: Feet supported Sitting balance-Leahy Scale: Fair Sitting balance - Comments: R foot supported, using UE's a lot initially to balance, then able to sit with minimal UE support and then used UE's to scoot hips to get back into bed                                    Cognition Arousal/Alertness: Awake/alert Behavior During Therapy: WFL for tasks assessed/performed Overall Cognitive Status: Within Functional Limits for tasks assessed                                        Exercises General Exercises - Lower Extremity Quad Sets: Strengthening;Both;10 reps;Supine Short Arc Quad: AROM;Left;10 reps;Supine Other Exercises Other Exercises: adductor squeezes x 10 w 5 sec hold    General Comments General comments (skin integrity, edema, etc.): scrotal and LE edema throughout; pt soiled with loose stool and assisted  for rolling in bed for hygiene, RN in to place new pad over R buttocks.      Pertinent Vitals/Pain Pain Assessment: Faces Faces Pain Scale: Hurts even more Pain Location: LLE Pain Descriptors / Indicators: Discomfort;Aching;Grimacing Pain Intervention(s): Monitored during session;RN gave pain meds during session;Repositioned    Home Living                      Prior Function            PT Goals (current goals can now be found in the care plan section) Progress towards PT goals: Progressing toward goals    Frequency           PT Plan Current plan remains appropriate    Co-evaluation              AM-PAC PT "6 Clicks" Mobility    Outcome Measure  Help needed turning from your back to your side while in a flat bed without using bedrails?: A Lot Help needed moving from lying on your back to sitting on the side of a flat bed without using bedrails?: A Lot Help needed moving to and from a bed to a chair (including a wheelchair)?: Total Help needed standing up from a chair using your arms (e.g., wheelchair or bedside chair)?: Total Help needed to walk in hospital room?: Total Help needed climbing 3-5 steps with a railing? : Total 6 Click Score: 8    End of Session Equipment Utilized During Treatment: Gait belt Activity Tolerance: Patient limited by fatigue Patient left: in bed;with call bell/phone within reach   PT Visit Diagnosis: Muscle weakness (generalized) (M62.81);Pain;Other abnormalities of gait and mobility (R26.89) Pain - Right/Left: Left Pain - part of body: Leg     Time: [id number] PT Time Calculation (min) (ACUTE ONLY): 53 min  Charges:  $Therapeutic Exercise: 8-22 mins $Therapeutic Activity: 38-52 mins                     Magda Kiel, PT Acute Rehabilitation Services [phone] [phone] 04/28/2021    Stephen Whitaker 04/28/2021, 3:55 PM

## 2021-04-28 NOTE — Progress Notes (Signed)
Patient is 1 week status post below-knee amputation.  0 cc in the canister  Wound VAC was removed today patient had quite a bit of pain during this.  Amputation stump looks quite good.  No skin breakdown well apposed wound edges no necrotic areas minimal bloody drainage swelling is well controlled new dressing applied patient will follow-up in our office in 1 week

## 2021-04-29 LAB — BASIC METABOLIC PANEL
Anion gap: 8 (ref 5–15)
BUN: 29 mg/dL — ABNORMAL HIGH (ref 8–23)
CO2: 26 mmol/L (ref 22–32)
Calcium: 7.8 mg/dL — ABNORMAL LOW (ref 8.9–10.3)
Chloride: 104 mmol/L (ref 98–111)
Creatinine, Ser: 1.15 mg/dL (ref 0.61–1.24)
GFR, Estimated: >60 mL/min (ref >60)
Glucose, Bld: 112 mg/dL — ABNORMAL HIGH (ref 70–99)
Potassium: 3.2 mmol/L — ABNORMAL LOW (ref 3.5–5.1)
Sodium: 138 mmol/L (ref 135–145)

## 2021-04-29 LAB — MAGNESIUM: Magnesium: 1.7 mg/dL (ref 1.7–2.4)

## 2021-04-29 MED ORDER — POTASSIUM CHLORIDE CRYS ER 20 MEQ PO TBCR
40.0000 meq | EXTENDED_RELEASE_TABLET | Freq: Once | ORAL | Status: AC
  Administered 2021-04-29: 40 meq via ORAL
  Filled 2021-04-29: qty 2

## 2021-04-29 NOTE — Progress Notes (Signed)
[facility] PROGRESS NOTE  Patient: Stephen Whitaker [id number]   PCP: [name], NP DOB: 1956/05/14   DOA: 04/15/2021   DOS: 04/29/2021    Subjective: No acute complaint.  No nausea no vomiting.  No fever no chills.  Volume improving.  Objective:  Vitals:   04/29/21 0833 04/29/21 1550  BP: 134/85 (!) 109/91  Pulse: (!) 107 92  Resp: 18 18  Temp: 98.2 F (36.8 C) 98 F (36.7 C)  SpO2: 98%     Scrotal edema still present but S1-S2 present. Clear to auscultation. No edema in the leg.  Assessment and plan: Amputation of left leg. Pain well controlled with current regimen.  Recurrent hypoglycemia Likely from poor circulation of fingertips rather than real hypoglycemia. Monitor.  HFrEF with third spacing,  Monitor for now. Will repeat albumin and Lasix dose tomorrow.  Author: Berle Mull, MD [facility] 04/29/2021 7:15 PM   If 7PM-7AM, please contact night-coverage at [email or web address]

## 2021-04-29 NOTE — Progress Notes (Signed)
Refused dressing change to his L stump. Offered pre medication but stated "not today".

## 2021-04-30 ENCOUNTER — Inpatient Hospital Stay (HOSPITAL_COMMUNITY): Payer: BC Managed Care – PPO

## 2021-04-30 LAB — BASIC METABOLIC PANEL
Anion gap: 5 (ref 5–15)
BUN: 31 mg/dL — ABNORMAL HIGH (ref 8–23)
CO2: 23 mmol/L (ref 22–32)
Calcium: 7.7 mg/dL — ABNORMAL LOW (ref 8.9–10.3)
Chloride: 109 mmol/L (ref 98–111)
Creatinine, Ser: 1.02 mg/dL (ref 0.61–1.24)
GFR, Estimated: >60 mL/min (ref >60)
Glucose, Bld: 98 mg/dL (ref 70–99)
Potassium: 3.9 mmol/L (ref 3.5–5.1)
Sodium: 137 mmol/L (ref 135–145)

## 2021-04-30 LAB — GLUCOSE, CAPILLARY: Glucose-Capillary: 31 mg/dL — CL (ref 70–99)

## 2021-04-30 LAB — CBC
HCT: 36 % — ABNORMAL LOW (ref 39.0–52.0)
Hemoglobin: 11.2 g/dL — ABNORMAL LOW (ref 13.0–17.0)
MCH: 27.2 pg (ref 26.0–34.0)
MCHC: 31.1 g/dL (ref 30.0–36.0)
MCV: 87.4 fL (ref 80.0–100.0)
Platelets: 205 10*3/uL (ref 150–400)
RBC: 4.12 MIL/uL — ABNORMAL LOW (ref 4.22–5.81)
RDW: 18.5 % — ABNORMAL HIGH (ref 11.5–15.5)
WBC: 3.7 10*3/uL — ABNORMAL LOW (ref 4.0–10.5)
nRBC: 0 % (ref 0.0–0.2)

## 2021-04-30 LAB — MAGNESIUM: Magnesium: 1.6 mg/dL — ABNORMAL LOW (ref 1.7–2.4)

## 2021-04-30 LAB — SARS CORONAVIRUS 2 (TAT 6-24 HRS): SARS Coronavirus 2: NEGATIVE

## 2021-04-30 MED ORDER — SACCHAROMYCES BOULARDII 250 MG PO CAPS
250.0000 mg | ORAL_CAPSULE | Freq: Two times a day (BID) | ORAL | Status: DC
  Administered 2021-04-30 – 2021-05-02 (×5): 250 mg via ORAL
  Filled 2021-04-30 (×5): qty 1

## 2021-04-30 MED ORDER — CHOLESTYRAMINE LIGHT 4 G PO PACK
4.0000 g | PACK | Freq: Once | ORAL | Status: AC
  Administered 2021-04-30: 4 g via ORAL
  Filled 2021-04-30: qty 1

## 2021-04-30 MED ORDER — LACTASE 3000 UNITS PO TABS
9000.0000 [IU] | ORAL_TABLET | Freq: Three times a day (TID) | ORAL | Status: DC
  Administered 2021-04-30 – 2021-05-02 (×7): 9000 [IU] via ORAL
  Filled 2021-04-30 (×8): qty 3

## 2021-04-30 MED ORDER — FAMOTIDINE 20 MG PO TABS
20.0000 mg | ORAL_TABLET | Freq: Every day | ORAL | Status: DC
  Administered 2021-04-30 – 2021-05-02 (×3): 20 mg via ORAL
  Filled 2021-04-30 (×3): qty 1

## 2021-04-30 MED ORDER — MAGNESIUM SULFATE 2 GM/50ML IV SOLN
2.0000 g | Freq: Once | INTRAVENOUS | Status: AC
  Administered 2021-04-30: 2 g via INTRAVENOUS
  Filled 2021-04-30 (×2): qty 50

## 2021-04-30 NOTE — Progress Notes (Signed)
Pt refused dressing change of his stump at this time. Will try again later.

## 2021-04-30 NOTE — Progress Notes (Signed)
Pt still refused dressing change to his Left stump despite PRN pain medication given.  Will endorse to day shift RN.

## 2021-04-30 NOTE — TOC Progression Note (Signed)
Transition of Care [facility]) - Progression Note    Patient Details  Name: TAYSON AHLSTROM MRN: [id number] Date of Birth: [birth year]  Transition of Care [facility] CM/SW [place], [place] Phone Number: 04/30/2021, 9:38 AM  Clinical Narrative:     SW requested COVID test from bedside RN for SNF tomorrow  Expected Discharge Plan: [place] Barriers to Discharge: Barriers Resolved  Expected Discharge Plan and Services Expected Discharge Plan: [place] Choice: [place] Living arrangements for the past 2 months: Apartment Expected Discharge Date: 04/25/21                                     Social Determinants of Health (SDOH) Interventions Food Insecurity Interventions: Intervention Not Indicated Financial Strain Interventions: Other (Comment) (CSW consulted) Housing Interventions: Other (Comment) (air ducts have mold per pt statement.) Transportation Interventions: Intervention Not Indicated  Readmission Risk Interventions No flowsheet data found.

## 2021-04-30 NOTE — Progress Notes (Signed)
[facility] PROGRESS NOTE  Patient: Stephen Whitaker [id number]   PCP: [name], NP DOB: 08-27-1955   DOA: 04/15/2021   DOS: 04/30/2021    Subjective: Reports 4 episodes of diarrhea.  No abdominal pain.  No nausea no vomiting but no fever no chills.  Objective:  Vitals:   04/30/21 1708 04/30/21 1808  BP: 100/82   Pulse: (!) 103 (!) 55  Resp: 14   Temp: 97.7 F (36.5 C)   SpO2: (!) 67% 97%    Clear to auscultation. S1-S2 present. Bowel sound present. No abdominal tenderness. No edema.  Assessment and plan: Diarrhea. Treat symptomatically. Do not think that this is infectious. X-ray abdomen negative.   Author: Berle Mull, MD [facility] 04/30/2021 8:50 PM   If 7PM-7AM, please contact night-coverage at [email or web address]

## 2021-05-01 LAB — GLUCOSE, CAPILLARY: Glucose-Capillary: 40 mg/dL — CL (ref 70–99)

## 2021-05-01 LAB — BASIC METABOLIC PANEL
Anion gap: 10 (ref 5–15)
Anion gap: 10 (ref 5–15)
BUN: 32 mg/dL — ABNORMAL HIGH (ref 8–23)
BUN: 28 mg/dL — ABNORMAL HIGH (ref 8–23)
CO2: 18 mmol/L — ABNORMAL LOW (ref 22–32)
CO2: 26 mmol/L (ref 22–32)
Calcium: 7.6 mg/dL — ABNORMAL LOW (ref 8.9–10.3)
Calcium: 7.8 mg/dL — ABNORMAL LOW (ref 8.9–10.3)
Chloride: 106 mmol/L (ref 98–111)
Chloride: 102 mmol/L (ref 98–111)
Creatinine, Ser: 1.11 mg/dL (ref 0.61–1.24)
Creatinine, Ser: 1.11 mg/dL (ref 0.61–1.24)
GFR, Estimated: >60 mL/min (ref >60)
GFR, Estimated: >60 mL/min (ref >60)
Glucose, Bld: 135 mg/dL — ABNORMAL HIGH (ref 70–99)
Glucose, Bld: 97 mg/dL (ref 70–99)
Potassium: 4 mmol/L (ref 3.5–5.1)
Potassium: 3.8 mmol/L (ref 3.5–5.1)
Sodium: 134 mmol/L — ABNORMAL LOW (ref 135–145)
Sodium: 138 mmol/L (ref 135–145)

## 2021-05-01 LAB — MAGNESIUM: Magnesium: 1.6 mg/dL — ABNORMAL LOW (ref 1.7–2.4)

## 2021-05-01 LAB — LACTIC ACID, PLASMA: Lactic Acid, Venous: 1.4 mmol/L (ref 0.5–1.9)

## 2021-05-01 MED ORDER — MAGNESIUM SULFATE 2 GM/50ML IV SOLN
2.0000 g | Freq: Once | INTRAVENOUS | Status: AC
  Administered 2021-05-01: 2 g via INTRAVENOUS
  Filled 2021-05-01: qty 50

## 2021-05-01 MED ORDER — ENOXAPARIN SODIUM 40 MG/0.4ML IJ SOSY
40.0000 mg | PREFILLED_SYRINGE | INTRAMUSCULAR | Status: DC
  Administered 2021-05-01 – 2021-05-02 (×2): 40 mg via SUBCUTANEOUS
  Filled 2021-05-01 (×2): qty 0.4

## 2021-05-01 MED ORDER — CHOLESTYRAMINE LIGHT 4 G PO PACK
4.0000 g | PACK | Freq: Two times a day (BID) | ORAL | Status: DC
  Administered 2021-05-01 – 2021-05-02 (×4): 4 g via ORAL
  Filled 2021-05-01 (×4): qty 1

## 2021-05-01 NOTE — Progress Notes (Signed)
Patient started sweating and complain of nausea. Check blood pressure 115/90, check blood sugar 40, notify Dr.Patel, he order Stat labs to justify blood sugar status. Will continue to monitor patient.

## 2021-05-01 NOTE — Progress Notes (Signed)
Heart Failure Nurse Navigator Progress Note  Moved HV TOC appt to 9/20 @ 10AM as pt is still hospitalized at this time.   Pricilla Holm, MSN, RN Heart Failure Nurse Navigator [phone]

## 2021-05-01 NOTE — Progress Notes (Signed)
Lab has notify the nurse twice about the patient STAT CBC has clotted twice unable to get results. Notify Dr. Posey Pronto, no follow up orders at this time.

## 2021-05-01 NOTE — TOC Transition Note (Addendum)
Transition of Care [facility]) - CM/SW Discharge Note   Patient Details  Name: ROYLE SABATH MRN: [id number] Date of Birth: 10/29/1955  Transition of Care [facility]) CM/SW Contact:  Emeterio Reeve, LCSW Phone Number: 05/01/2021, 11:01 AM   Clinical Narrative:     Pt will not DC today.   Final next level of care: Homeless Shelter Barriers to Discharge: Barriers Resolved   Patient Goals and CMS Choice Patient states their goals for this hospitalization and ongoing recovery are:: to get better CMS Medicare.gov Compare Post Acute Care list provided to:: Patient Choice offered to / list presented to : Patient  Discharge Placement              Patient chooses bed at: Other - please specify in the comment section below: ([facility]) Patient to be transferred to facility by: ptar Name of family member notified: pt declined csw calling family Patient and family notified of of transfer: 04/26/21  Discharge Plan and Services     Post Acute Care Choice: [place]                               Social Determinants of Health (SDOH) Interventions Food Insecurity Interventions: Intervention Not Indicated Financial Strain Interventions: Other (Comment) (CSW consulted) Housing Interventions: Other (Comment) (air ducts have mold per pt statement.) Transportation Interventions: Intervention Not Indicated   Readmission Risk Interventions No flowsheet data found.

## 2021-05-01 NOTE — Progress Notes (Signed)
[facility] Progress Note  Patient: Stephen Whitaker    [id number]  DOA: 04/15/2021     Date of Service: the patient was seen and examined on 05/01/2021  Brief hospital course: Past medical history of COPD, chronic systolic CHF, type II DM, CKD 3B, HTN, SVT, gout, severe malnutrition.  Presents with complaints of chronic left foot ulceration with worsening. Underwent BKA on 8/31.  On IV antibiotics as well. Cardiology was consulted preop clearance.  Moderate risk for surgery but not prohibitive. Postop patient had a wound VAC. Ongoing issues with hypoglycemia and diarrhea requiring further work-up and stabilization. Currently plan is monitor for resolution of diarrhea.  Subjective: Reports 5 episodes of diarrhea.  Per RN patient only had 1 episode of diarrhea throughout the day.  No abdominal pain.  Also reported nausea later on.  RN reports that the patient had significant diaphoresis as well which resolved.  No blood in the stool.  No abdominal pain at the time of my evaluation.  No chest pain.  No cough.  No fever no chills.  Assessment and Plan: 1.  Left leg chronic Charcot foot ulceration with worsening SP BKA Appreciate orthopedic consultation. Wound VAC was removed on 9/9. Patient was treated with IV antibiotics. Currently no antibiotic needed. Outpatient follow-up in office in 1 week recommended.  2.  Recurrent hypoglycemia likely from poor circulation Raynaud's phenomenon Patient reports that he is suffering from chronic intermittent discoloration of his fingertips, likely consistent with Raynaud's. Since 9/1 patient has been identified to have frequent hypoglycemic episodes without any improvement despite oral or IV therapy. Patient had 2 episodes of hypoglycemia, which did not correlate with vein glucose levels checked in 45 minutes. Patient is nonsymptomatic for his hypoglycemic episodes. C-peptide is elevated when it was checked with one of the episode but  insulin levels were normal. This is not consistent with any significant pathology. All the patient's ongoing diarrhea and C-peptide elevation were concerning enough which led to CT scan of the abdomen which is negative for any significant neuroendocrine tumor. Current recommendation is to check blood glucose level rather than POCT test for this patient.  3.  Frequent diarrhea Lactose intolerance Patient reports chronic diarrhea present even before admission. Diarrhea appears to have worsened for the last few days. Patient has hypomagnesemia on the lab work and metabolic acidosis as well. Does not appear to be infectious as a CT abdomen and x-ray abdomen all negative. No abdominal tenderness on my exam. Treat conservatively with Imodium and Questran packet. Given that the patient is suffering from protein calorie malnutrition and will benefit from outpatient GI consultation. If continues to have further diarrhea despite above regimen will consult GI in the hospital. Patient reports lactose intolerance.  Will provide lactase and monitor.  4.  HFrEF Third spacing with hypoalbuminemia Treated with IV Lasix with IV albumin. Currently continuing Entresto.  Also continue Toprol-XL.  5.  Severe protein calorie malnutrition Continue supplementation.  Monitor.  6.  Hypokalemia, hypomagnesemia Acute kidney injury Kidney injury resolved. Potassium corrected magnesium corrected.  7.  Right upper extremity edema. Will check ultrasound Doppler to rule out DVT.  Scheduled Meds:  (feeding supplement) PROSource Plus  30 mL Oral TID BM   allopurinol  300 mg Oral Daily   vitamin C  1,000 mg Oral Daily   Chlorhexidine Gluconate Cloth  6 each Topical Daily   cholestyramine light  4 g Oral BID   enoxaparin (LOVENOX) injection  40 mg Subcutaneous Q24H   famotidine  20 mg Oral Daily   feeding supplement (GLUCERNA SHAKE)  237 mL Oral TID BM   fluticasone  1 spray Each Nare BID   lactase  9,000 Units  Oral TID WC   loratadine  10 mg Oral Daily   metoprolol succinate  25 mg Oral Daily   mometasone-formoterol  2 puff Inhalation BID   montelukast  10 mg Oral QHS   multivitamin with minerals  1 tablet Oral Daily   nystatin  5 mL Oral QID   olopatadine  1 drop Both Eyes BID   saccharomyces boulardii  250 mg Oral BID   sacubitril-valsartan  1 tablet Oral BID   tamsulosin  0.4 mg Oral QPC supper   zinc sulfate  220 mg Oral Daily   Continuous Infusions: PRN Meds: acetaminophen **OR** acetaminophen, albuterol, alum & mag hydroxide-simeth, guaiFENesin-dextromethorphan, hydrALAZINE, HYDROcodone-acetaminophen, labetalol, loperamide, ondansetron **OR** ondansetron (ZOFRAN) IV, phenol  Body mass index is 25.68 kg/m.  Nutrition Problem: Severe Malnutrition Etiology: acute illness (lt foot osteomyelitis) Pressure Injury 04/16/21 Buttocks Left Stage 2 -  Partial thickness loss of dermis presenting as a shallow open injury with a red, pink wound bed without slough. (Active)  04/16/21 2302  Location: Buttocks  Location Orientation: Left  Staging: Stage 2 -  Partial thickness loss of dermis presenting as a shallow open injury with a red, pink wound bed without slough.  Wound Description (Comments):   Present on Admission: Yes     DVT Prophylaxis:   enoxaparin (LOVENOX) injection 40 mg Start: 05/01/21 1400 SCD's Start: 04/21/21 1512 Place and maintain sequential compression device Start: 04/16/21 1913    Advance goals of care discussion: Pt is Full code.  Family Communication: no family was present at bedside, at the time of interview.   Data Reviewed: I have personally reviewed and interpreted daily labs, tele strips, imaging. POCT glucose 40.  Blood glucose 97.  Serum creatinine stable.  Potassium level stable.  Lactic acid 1.4.  Physical Exam:  General: Appear in mild distress, no Rash; Oral Mucosa Clear, moist. no Abnormal Neck Mass Or lumps, Conjunctiva normal  Cardiovascular: S1 and  S2 Present, no Murmur, Respiratory: good respiratory effort, Bilateral Air entry present and CTA, no Crackles, no wheezes Abdomen: Bowel Sound present, Soft and no tenderness Extremities: no Pedal edema Neurology: alert and oriented to time, place, and person affect appropriate. no new focal deficit Gait not checked due to patient safety concerns    Vitals:   05/01/21 1330 05/01/21 1705 05/01/21 2021 05/01/21 2045  BP: 115/90 114/78  (!) 160/135  Pulse: (!) 102 (!) 102 100 96  Resp:  '16 16 18  '$ Temp:  97.9 F (36.6 C)  98.4 F (36.9 C)  TempSrc:  Oral  Oral  SpO2:  (!) 89% 99% (!) 63%  Weight:      Height:        Disposition:  Status is: Inpatient  Remains inpatient appropriate because:Ongoing diagnostic testing needed not appropriate for outpatient work up  Dispo: The patient is from: Home              Anticipated d/c is to: SNF              Patient currently is not medically stable to d/c.   Difficult to place patient No        Time spent: 35 minutes. I reviewed all nursing notes, pharmacy notes, vitals, pertinent old records. I have discussed plan of care as described above with RN.  Author:  Berle Mull, MD [facility] 05/01/2021 9:06 PM  To reach On-call, see care teams to locate the attending and reach out via [email or web address]. Between 7PM-7AM, please contact night-coverage If you still have difficulty reaching the attending provider, please page the [facility] (Director on Call) for [facility] on amion for assistance.

## 2021-05-02 ENCOUNTER — Encounter (HOSPITAL_COMMUNITY): Payer: BC Managed Care – PPO

## 2021-05-02 ENCOUNTER — Inpatient Hospital Stay (HOSPITAL_COMMUNITY): Payer: BC Managed Care – PPO

## 2021-05-02 DIAGNOSIS — I7300 Raynaud's syndrome without gangrene: Secondary | ICD-10-CM | POA: Diagnosis not present

## 2021-05-02 DIAGNOSIS — R627 Adult failure to thrive: Secondary | ICD-10-CM | POA: Diagnosis present

## 2021-05-02 DIAGNOSIS — Z7901 Long term (current) use of anticoagulants: Secondary | ICD-10-CM | POA: Diagnosis not present

## 2021-05-02 DIAGNOSIS — Z89512 Acquired absence of left leg below knee: Secondary | ICD-10-CM | POA: Diagnosis not present

## 2021-05-02 DIAGNOSIS — I739 Peripheral vascular disease, unspecified: Secondary | ICD-10-CM | POA: Diagnosis not present

## 2021-05-02 DIAGNOSIS — L89322 Pressure ulcer of left buttock, stage 2: Secondary | ICD-10-CM | POA: Diagnosis present

## 2021-05-02 DIAGNOSIS — Z7189 Other specified counseling: Secondary | ICD-10-CM | POA: Diagnosis not present

## 2021-05-02 DIAGNOSIS — I517 Cardiomegaly: Secondary | ICD-10-CM | POA: Diagnosis not present

## 2021-05-02 DIAGNOSIS — D500 Iron deficiency anemia secondary to blood loss (chronic): Secondary | ICD-10-CM | POA: Diagnosis not present

## 2021-05-02 DIAGNOSIS — R630 Anorexia: Secondary | ICD-10-CM | POA: Diagnosis not present

## 2021-05-02 DIAGNOSIS — L309 Dermatitis, unspecified: Secondary | ICD-10-CM | POA: Diagnosis not present

## 2021-05-02 DIAGNOSIS — I5084 End stage heart failure: Secondary | ICD-10-CM | POA: Diagnosis present

## 2021-05-02 DIAGNOSIS — J45909 Unspecified asthma, uncomplicated: Secondary | ICD-10-CM | POA: Diagnosis not present

## 2021-05-02 DIAGNOSIS — M7989 Other specified soft tissue disorders: Secondary | ICD-10-CM | POA: Diagnosis not present

## 2021-05-02 DIAGNOSIS — R5381 Other malaise: Secondary | ICD-10-CM | POA: Diagnosis not present

## 2021-05-02 DIAGNOSIS — A419 Sepsis, unspecified organism: Secondary | ICD-10-CM | POA: Diagnosis not present

## 2021-05-02 DIAGNOSIS — J90 Pleural effusion, not elsewhere classified: Secondary | ICD-10-CM | POA: Diagnosis not present

## 2021-05-02 DIAGNOSIS — J690 Pneumonitis due to inhalation of food and vomit: Secondary | ICD-10-CM | POA: Diagnosis present

## 2021-05-02 DIAGNOSIS — R52 Pain, unspecified: Secondary | ICD-10-CM | POA: Diagnosis not present

## 2021-05-02 DIAGNOSIS — E11610 Type 2 diabetes mellitus with diabetic neuropathic arthropathy: Secondary | ICD-10-CM | POA: Diagnosis not present

## 2021-05-02 DIAGNOSIS — J4530 Mild persistent asthma, uncomplicated: Secondary | ICD-10-CM | POA: Diagnosis present

## 2021-05-02 DIAGNOSIS — I82611 Acute embolism and thrombosis of superficial veins of right upper extremity: Secondary | ICD-10-CM | POA: Diagnosis not present

## 2021-05-02 DIAGNOSIS — I2699 Other pulmonary embolism without acute cor pulmonale: Secondary | ICD-10-CM | POA: Diagnosis not present

## 2021-05-02 DIAGNOSIS — I10 Essential (primary) hypertension: Secondary | ICD-10-CM | POA: Diagnosis not present

## 2021-05-02 DIAGNOSIS — I471 Supraventricular tachycardia: Secondary | ICD-10-CM | POA: Diagnosis not present

## 2021-05-02 DIAGNOSIS — J302 Other seasonal allergic rhinitis: Secondary | ICD-10-CM | POA: Diagnosis not present

## 2021-05-02 DIAGNOSIS — M109 Gout, unspecified: Secondary | ICD-10-CM | POA: Diagnosis not present

## 2021-05-02 DIAGNOSIS — R55 Syncope and collapse: Secondary | ICD-10-CM | POA: Diagnosis not present

## 2021-05-02 DIAGNOSIS — Z20822 Contact with and (suspected) exposure to covid-19: Secondary | ICD-10-CM | POA: Diagnosis present

## 2021-05-02 DIAGNOSIS — M86272 Subacute osteomyelitis, left ankle and foot: Secondary | ICD-10-CM | POA: Diagnosis not present

## 2021-05-02 DIAGNOSIS — Z66 Do not resuscitate: Secondary | ICD-10-CM | POA: Diagnosis present

## 2021-05-02 DIAGNOSIS — J449 Chronic obstructive pulmonary disease, unspecified: Secondary | ICD-10-CM | POA: Diagnosis not present

## 2021-05-02 DIAGNOSIS — N401 Enlarged prostate with lower urinary tract symptoms: Secondary | ICD-10-CM | POA: Diagnosis not present

## 2021-05-02 DIAGNOSIS — L89152 Pressure ulcer of sacral region, stage 2: Secondary | ICD-10-CM | POA: Diagnosis present

## 2021-05-02 DIAGNOSIS — R338 Other retention of urine: Secondary | ICD-10-CM | POA: Diagnosis not present

## 2021-05-02 DIAGNOSIS — F4321 Adjustment disorder with depressed mood: Secondary | ICD-10-CM | POA: Diagnosis not present

## 2021-05-02 DIAGNOSIS — B370 Candidal stomatitis: Secondary | ICD-10-CM | POA: Diagnosis not present

## 2021-05-02 DIAGNOSIS — R531 Weakness: Secondary | ICD-10-CM | POA: Diagnosis not present

## 2021-05-02 DIAGNOSIS — E875 Hyperkalemia: Secondary | ICD-10-CM | POA: Diagnosis not present

## 2021-05-02 DIAGNOSIS — E119 Type 2 diabetes mellitus without complications: Secondary | ICD-10-CM | POA: Diagnosis not present

## 2021-05-02 DIAGNOSIS — Z596 Low income: Secondary | ICD-10-CM | POA: Diagnosis not present

## 2021-05-02 DIAGNOSIS — R0602 Shortness of breath: Secondary | ICD-10-CM | POA: Diagnosis not present

## 2021-05-02 DIAGNOSIS — E876 Hypokalemia: Secondary | ICD-10-CM | POA: Diagnosis not present

## 2021-05-02 DIAGNOSIS — R609 Edema, unspecified: Secondary | ICD-10-CM | POA: Diagnosis not present

## 2021-05-02 DIAGNOSIS — Z515 Encounter for palliative care: Secondary | ICD-10-CM | POA: Diagnosis not present

## 2021-05-02 DIAGNOSIS — L89312 Pressure ulcer of right buttock, stage 2: Secondary | ICD-10-CM | POA: Diagnosis present

## 2021-05-02 DIAGNOSIS — Z960 Presence of urogenital implants: Secondary | ICD-10-CM | POA: Diagnosis not present

## 2021-05-02 DIAGNOSIS — I2694 Multiple subsegmental pulmonary emboli without acute cor pulmonale: Secondary | ICD-10-CM | POA: Diagnosis not present

## 2021-05-02 DIAGNOSIS — R54 Age-related physical debility: Secondary | ICD-10-CM | POA: Diagnosis present

## 2021-05-02 DIAGNOSIS — K219 Gastro-esophageal reflux disease without esophagitis: Secondary | ICD-10-CM | POA: Diagnosis not present

## 2021-05-02 DIAGNOSIS — E1151 Type 2 diabetes mellitus with diabetic peripheral angiopathy without gangrene: Secondary | ICD-10-CM | POA: Diagnosis present

## 2021-05-02 DIAGNOSIS — Z794 Long term (current) use of insulin: Secondary | ICD-10-CM | POA: Diagnosis not present

## 2021-05-02 DIAGNOSIS — Z79899 Other long term (current) drug therapy: Secondary | ICD-10-CM | POA: Diagnosis not present

## 2021-05-02 DIAGNOSIS — E162 Hypoglycemia, unspecified: Secondary | ICD-10-CM | POA: Diagnosis not present

## 2021-05-02 DIAGNOSIS — Z7401 Bed confinement status: Secondary | ICD-10-CM | POA: Diagnosis not present

## 2021-05-02 DIAGNOSIS — D649 Anemia, unspecified: Secondary | ICD-10-CM | POA: Diagnosis not present

## 2021-05-02 DIAGNOSIS — H1013 Acute atopic conjunctivitis, bilateral: Secondary | ICD-10-CM | POA: Diagnosis not present

## 2021-05-02 DIAGNOSIS — R748 Abnormal levels of other serum enzymes: Secondary | ICD-10-CM | POA: Diagnosis not present

## 2021-05-02 DIAGNOSIS — E109 Type 1 diabetes mellitus without complications: Secondary | ICD-10-CM | POA: Diagnosis not present

## 2021-05-02 DIAGNOSIS — D696 Thrombocytopenia, unspecified: Secondary | ICD-10-CM | POA: Diagnosis not present

## 2021-05-02 DIAGNOSIS — R0902 Hypoxemia: Secondary | ICD-10-CM | POA: Diagnosis not present

## 2021-05-02 DIAGNOSIS — W19XXXA Unspecified fall, initial encounter: Secondary | ICD-10-CM | POA: Diagnosis not present

## 2021-05-02 DIAGNOSIS — R4182 Altered mental status, unspecified: Secondary | ICD-10-CM | POA: Diagnosis not present

## 2021-05-02 DIAGNOSIS — Z5181 Encounter for therapeutic drug level monitoring: Secondary | ICD-10-CM | POA: Diagnosis not present

## 2021-05-02 DIAGNOSIS — M869 Osteomyelitis, unspecified: Secondary | ICD-10-CM | POA: Diagnosis not present

## 2021-05-02 DIAGNOSIS — Z882 Allergy status to sulfonamides status: Secondary | ICD-10-CM | POA: Diagnosis not present

## 2021-05-02 DIAGNOSIS — N400 Enlarged prostate without lower urinary tract symptoms: Secondary | ICD-10-CM | POA: Diagnosis not present

## 2021-05-02 DIAGNOSIS — E161 Other hypoglycemia: Secondary | ICD-10-CM | POA: Diagnosis not present

## 2021-05-02 DIAGNOSIS — I5022 Chronic systolic (congestive) heart failure: Secondary | ICD-10-CM | POA: Diagnosis not present

## 2021-05-02 DIAGNOSIS — E46 Unspecified protein-calorie malnutrition: Secondary | ICD-10-CM | POA: Diagnosis not present

## 2021-05-02 DIAGNOSIS — Z5941 Food insecurity: Secondary | ICD-10-CM | POA: Diagnosis not present

## 2021-05-02 DIAGNOSIS — R7989 Other specified abnormal findings of blood chemistry: Secondary | ICD-10-CM | POA: Diagnosis not present

## 2021-05-02 DIAGNOSIS — I82890 Acute embolism and thrombosis of other specified veins: Secondary | ICD-10-CM | POA: Diagnosis not present

## 2021-05-02 DIAGNOSIS — J9601 Acute respiratory failure with hypoxia: Secondary | ICD-10-CM | POA: Diagnosis not present

## 2021-05-02 DIAGNOSIS — R000 Tachycardia, unspecified: Secondary | ICD-10-CM | POA: Diagnosis not present

## 2021-05-02 DIAGNOSIS — E11649 Type 2 diabetes mellitus with hypoglycemia without coma: Secondary | ICD-10-CM | POA: Diagnosis not present

## 2021-05-02 DIAGNOSIS — R64 Cachexia: Secondary | ICD-10-CM | POA: Diagnosis present

## 2021-05-02 DIAGNOSIS — Z89421 Acquired absence of other right toe(s): Secondary | ICD-10-CM | POA: Diagnosis not present

## 2021-05-02 DIAGNOSIS — R9431 Abnormal electrocardiogram [ECG] [EKG]: Secondary | ICD-10-CM | POA: Diagnosis not present

## 2021-05-02 DIAGNOSIS — Z881 Allergy status to other antibiotic agents status: Secondary | ICD-10-CM | POA: Diagnosis not present

## 2021-05-02 DIAGNOSIS — T781XXD Other adverse food reactions, not elsewhere classified, subsequent encounter: Secondary | ICD-10-CM | POA: Diagnosis not present

## 2021-05-02 DIAGNOSIS — Z89511 Acquired absence of right leg below knee: Secondary | ICD-10-CM | POA: Diagnosis not present

## 2021-05-02 DIAGNOSIS — D72819 Decreased white blood cell count, unspecified: Secondary | ICD-10-CM | POA: Diagnosis not present

## 2021-05-02 DIAGNOSIS — Z4781 Encounter for orthopedic aftercare following surgical amputation: Secondary | ICD-10-CM | POA: Diagnosis not present

## 2021-05-02 DIAGNOSIS — I5020 Unspecified systolic (congestive) heart failure: Secondary | ICD-10-CM | POA: Diagnosis not present

## 2021-05-02 DIAGNOSIS — I428 Other cardiomyopathies: Secondary | ICD-10-CM | POA: Diagnosis not present

## 2021-05-02 DIAGNOSIS — Z7951 Long term (current) use of inhaled steroids: Secondary | ICD-10-CM | POA: Diagnosis not present

## 2021-05-02 DIAGNOSIS — I110 Hypertensive heart disease with heart failure: Secondary | ICD-10-CM | POA: Diagnosis not present

## 2021-05-02 DIAGNOSIS — N1832 Chronic kidney disease, stage 3b: Secondary | ICD-10-CM | POA: Diagnosis not present

## 2021-05-02 DIAGNOSIS — S98131A Complete traumatic amputation of one right lesser toe, initial encounter: Secondary | ICD-10-CM | POA: Diagnosis not present

## 2021-05-02 MED ORDER — SACCHAROMYCES BOULARDII 250 MG PO CAPS: 250.0000 mg | ORAL_CAPSULE | Freq: Two times a day (BID) | ORAL | 0 refills | Status: DC

## 2021-05-02 MED ORDER — LOPERAMIDE HCL 2 MG PO CAPS: 2.0000 mg | ORAL_CAPSULE | ORAL | 0 refills | Status: DC | PRN

## 2021-05-02 MED ORDER — ADULT MULTIVITAMIN W/MINERALS CH: 1.0000 | ORAL_TABLET | Freq: Every day | ORAL | 0 refills | Status: DC

## 2021-05-02 MED ORDER — ZINC SULFATE 220 (50 ZN) MG PO CAPS: 220.0000 mg | ORAL_CAPSULE | Freq: Every day | ORAL | 0 refills | Status: DC

## 2021-05-02 MED ORDER — FUROSEMIDE 20 MG PO TABS: 20.0000 mg | ORAL_TABLET | Freq: Every day | ORAL | 11 refills | Status: DC

## 2021-05-02 MED ORDER — ASCORBIC ACID 1000 MG PO TABS: 1000.0000 mg | ORAL_TABLET | Freq: Every day | ORAL | 0 refills | Status: DC

## 2021-05-02 MED ORDER — LACTASE 3000 UNITS PO TABS: 9000.0000 [IU] | ORAL_TABLET | Freq: Three times a day (TID) | ORAL | 0 refills | Status: DC

## 2021-05-02 MED ORDER — PROSOURCE PLUS PO LIQD: 30.0000 mL | Freq: Three times a day (TID) | ORAL | 0 refills | Status: DC

## 2021-05-02 NOTE — Discharge Summary (Addendum)
[facility] Discharge Summary   Patient: Stephen Whitaker [id number]  PCP: [name], NP  Date of admission: 04/15/2021   Date of discharge:  05/02/2021     Discharge Diagnoses:  Principal Problem:   Osteomyelitis of left foot ([place]) Active Problems:   COLD (chronic obstructive lung disease) ([place])   Essential hypertension   HFrEF (heart failure with reduced ejection fraction) ([facility])   Fatigue   Pressure injury of skin   Protein-calorie malnutrition, severe SVT right upper extremity   Admitted From: home Disposition:  SNF  Recommendations for Outpatient Follow-up:  PCP: follow up with PCP in 1 week and Orthopedics, urology, Cardiology as recommended  Follow up LABS/TEST:  CBC and BMP   Follow-up Information     Suzan Slick, NP Follow up in 1 week(s).   Specialty: Orthopedic Surgery Contact information: [place] [address] [phone]         [name], NP. Schedule an appointment as soon as possible for a visit in 1 week(s).   Specialty: Nurse Practitioner Contact information: [place] [address] [phone]         [facility]. Schedule an appointment as soon as possible for a visit in 1 week(s).   Contact information: [address] [place] [facility]. Schedule an appointment as soon as possible for a visit in 2 week(s).   Specialty: Cardiology Why: office will call you with appointment Contact information: [address], [place] [phone]               Discharge Instructions     Ambulatory referral to Urology   Complete by: As directed    Apply dressing   Complete by: As directed    May cleanse stump with antibacterial soap and water. Apply clean dry black shrinker   Diet - low sodium heart healthy   Complete by: As directed    Diet regular    Complete by: As directed    Discharge wound care:   Complete by: As directed    May cleans stump with antibacterial sopa and water. Apply clean dry dressing and shrinker   Increase activity slowly   Complete by: As directed    Increase activity slowly   Complete by: As directed    Negative Pressure Wound Therapy - Incisional   Complete by: As directed    Show patient how to attach preveena vac. Should call office if alarms   No wound care   Complete by: As directed        Diet recommendation: Regular diet  Activity: The patient is advised to gradually reintroduce usual activities, as tolerated  Discharge Condition: stable  Code Status: Full code   History of present illness: As per the H and P dictated on admission, "Stephen Whitaker is a 65 y.o. male with medical history significant of COPD; chronic systolic CHF (EF 0000000); DM; and HTN presenting with foot infection.  He has been followed by Dr. Sharol Given for his chronic L Charcot foot ulceration and has been on doxy with wound care.  He was last seen on 8/25 and was planning for BKA this coming Wednesday (8/31).   The patient worked yesterday and drove home but was too tired to get out of the car and fell asleep in the driveway.  Neighbors helped him  inside and then he was too weak to get off the commode and so he came to the ER.  He denied any additional physical concerns at the time of our discussion.  He did report unstable living situation (landlord is evicting him), need for assistance in obtaining short and long-term disability through his work, and probable need for placement after surgery since he does not have other available housing options."  Hospital Course:  Past medical history of COPD, chronic systolic CHF, type II DM, CKD 3B, HTN, SVT, gout, severe malnutrition.  Presents with complaints of chronic left foot ulceration with worsening. Underwent BKA on 8/31.  On IV antibiotics as well. Cardiology was consulted preop  clearance.  Moderate risk for surgery but not prohibitive. Postop patient had a wound VAC. Ongoing issues with hypoglycemia and diarrhea requiring further work-up and stabilization.  Summary of his active problems in the hospital is as following. 1.  Left leg chronic Charcot foot ulceration with worsening SP BKA Appreciate orthopedic consultation. Wound VAC was removed on 9/9. Patient was treated with IV antibiotics. Currently no antibiotic needed. Outpatient follow-up in office in 1 week recommended.   2.  Recurrent hypoglycemia likely from poor circulation Raynaud's phenomenon Patient reports that he is suffering from chronic intermittent discoloration of his fingertips, likely consistent with Raynaud's. Since 9/1 patient has been identified to have frequent hypoglycemic episodes without any improvement despite oral or IV therapy. Patient had 2 episodes of hypoglycemia, which did not correlate with vein glucose levels checked in 45 minutes. Patient is nonsymptomatic for his hypoglycemic episodes. C-peptide is elevated when it was checked with one of the episode but insulin levels were normal. This is not consistent with any significant pathology. All the patient's ongoing diarrhea and C-peptide elevation were concerning enough which led to CT scan of the abdomen which is negative for any significant neuroendocrine tumor. Current recommendation is to check blood glucose level rather than POCT test for this patient.   3.  Frequent diarrhea Lactose intolerance Patient reports chronic diarrhea present even before admission. Diarrhea appears to have worsened for the last few days. Patient has hypomagnesemia on the lab work and metabolic acidosis as well. Does not appear to be infectious as a CT abdomen and x-ray abdomen all negative. No abdominal tenderness on my exam. Treat conservatively with Imodium and Questran packet. Continue PRN imodium.   Given that the patient is suffering from  protein calorie malnutrition and will benefit from outpatient GI consultation. Patient reports lactose intolerance.  Will provide lactase and monitor.   4.  HFrEF Third spacing with hypoalbuminemia Scrotal edema Cardiology consulted. Outpt follow up recommended  Treated with IV Lasix with IV albumin. Currently continuing Entresto.  Also continue Toprol-XL Introduce low dose lasix for swelling now that the diarrhea is better.   5.  Severe protein calorie malnutrition Continue supplementation.  Monitor.   6.  Hypokalemia, hypomagnesemia Acute kidney injury on CKD 3b. Kidney injury resolved. Potassium corrected magnesium corrected.   7.  Right upper extremity edema. SVT of basilic vein in the upper arm and the cephalic vein in the forearm.  No DVT. Warm compress.  8. Pressure ulcer Left buttocks. Conitnue foam dressing.  9. Chronic gout Continue allopurinol 300 daily  10. COPD without exacerbation Continue Dulera 2 puffs twice daily, Singulair 10 at bedtime, albuterol every 4 as needed for wheeze  11. LUTS, probable BPH Retaining about 1000 cc of urine Foley inserted on 9/6.  Attempt voiding trial in 3 days 9/16.  May need urology consult if has recurrence  Continue foley on discharge.  Continue Flomax.   12. Oropharyngeal thrush TREATED with nystatin 4 times and completed therapy prior to discharge  Body mass index is 25.68 kg/m.  Nutrition Problem: Severe Malnutrition Etiology: acute illness (lt foot osteomyelitis) Nutrition Interventions: Interventions: Ensure Enlive (each supplement provides 350kcal and 20 grams of protein), Magic cup, MVI  Pressure Injury 04/16/21 Buttocks Left Stage 2 -  Partial thickness loss of dermis presenting as a shallow open injury with a red, pink wound bed without slough. (Active)  04/16/21 2302  Location: Buttocks  Location Orientation: Left  Staging: Stage 2 -  Partial thickness loss of dermis presenting as a shallow open injury  with a red, pink wound bed without slough.  Wound Description (Comments):   Present on Admission: Yes     Pain control  - [place] Controlled Substance Reporting System database was reviewed. - 5 day supply was provided. - Patient was instructed, not to drive, operate heavy machinery, perform activities at heights, swimming or participation in water activities or provide baby sitting services while on Pain, Sleep and Anxiety Medications; until his outpatient Physician has advised to do so again.  - Also recommended to not to take more than prescribed Pain, Sleep and Anxiety Medications.  Patient was seen by physical therapy, who recommended SNF. On the day of the discharge the patient's vitals were stable, and no other new acute medical condition were reported. The patient was felt safe to be discharge at SNF with Therapy.  Consultants: Cardiology Orthopedics  Procedures: : Transtibial amputation and Application of Prevena wound VAC 9/2  DISCHARGE MEDICATION: Allergies as of 05/02/2021       Reactions   Ciprofloxacin Rash, Other (See Comments)   SYNCOPE    Lactose Intolerance (gi) Diarrhea   Shellfish Allergy Anaphylaxis   Sulfa Antibiotics Other (See Comments)   SYNCOPE DIZZINESS         Medication List     STOP taking these medications    allopurinol 300 MG tablet Commonly known as: ZYLOPRIM   Blood Pressure Monitoring Devi   doxycycline 100 MG tablet Commonly known as: VIBRA-TABS   Magnesium Oxide 400 MG Caps   Papaya and Enzymes Chew   torsemide 20 MG tablet Commonly known as: Demadex       TAKE these medications    (feeding supplement) PROSource Plus liquid Take 30 mLs by mouth 3 (three) times daily between meals.   acetaminophen 325 MG tablet Commonly known as: TYLENOL Take 2 tablets (650 mg total) by mouth every 6 (six) hours as needed for mild pain (or Fever >/= 101).   albuterol 108 (90 Base) MCG/ACT inhaler Commonly known as: Ventolin  HFA Inhale 2 puffs into the lungs every 4 (four) hours as needed for wheezing or shortness of breath.   ascorbic acid 1000 MG tablet Commonly known as: VITAMIN C Take 1 tablet (1,000 mg total) by mouth daily. Start taking on: May 03, 2021   azelastine 0.1 % nasal spray Commonly known as: ASTELIN Place 1-2 sprays into both nostrils 2 (two) times daily as needed (nasal drainage). Use in each nostril as directed   BLUE-EMU HEMP EX Apply 1 application topically daily as needed.   budesonide-formoterol 160-4.5 MCG/ACT inhaler Commonly known as: SYMBICORT USE 2 INHALATIONS TWICE A DAY   Entresto 24-26 MG Generic drug: sacubitril-valsartan Take 1 tablet by mouth 2 (two) times daily. Notes to patient: Tonight at bedtime   EPINEPHrine 0.3  mg/0.3 mL Soaj injection Commonly known as: Auvi-Q Inject 0.3 mg into the muscle as needed for anaphylaxis.   fluticasone 50 MCG/ACT nasal spray Commonly known as: FLONASE USE 1 SPRAY IN EACH NOSTRIL TWICE A DAY   furosemide 20 MG tablet Commonly known as: Lasix Take 1 tablet (20 mg total) by mouth daily.   HYDROcodone-acetaminophen 5-325 MG tablet Commonly known as: NORCO/VICODIN Take 1-2 tablets by mouth every 6 (six) hours as needed for moderate pain or severe pain. What changed: reasons to take this   lactase 3000 units tablet Commonly known as: LACTAID Take 3 tablets (9,000 Units total) by mouth 3 (three) times daily with meals. What changed:  medication strength how much to take when to take this reasons to take this   loperamide 2 MG capsule Commonly known as: IMODIUM Take 1 capsule (2 mg total) by mouth as needed for diarrhea or loose stools.   metoprolol succinate 25 MG 24 hr tablet Commonly known as: Toprol XL Take 1 tablet (25 mg total) by mouth daily.   Mitigare 0.6 MG Caps Generic drug: Colchicine Take 1 capsule by mouth as needed.   mometasone-formoterol 200-5 MCG/ACT Aero Commonly known as: DULERA Inhale 2  puffs into the lungs daily as needed for wheezing.   montelukast 10 MG tablet Commonly known as: SINGULAIR TAKE 1 TABLET AT BEDTIME   multivitamin with minerals Tabs tablet Take 1 tablet by mouth daily. Start taking on: May 03, 2021   Olopatadine HCl 0.2 % Soln Apply 1 drop to eye daily as needed (itchy/watery eyes).   pantoprazole 40 MG tablet Commonly known as: PROTONIX TAKE ONE TABLET BY MOUTH 30 TO 60 MINUTES BEFORE YOUR FIRST AND LAST MEALS OF THE DAY What changed: See the new instructions.   polyethylene glycol 17 g packet Commonly known as: MIRALAX / GLYCOLAX Take 17 g by mouth daily as needed for mild constipation.   saccharomyces boulardii 250 MG capsule Commonly known as: FLORASTOR Take 1 capsule (250 mg total) by mouth 2 (two) times daily.   SPIRULINA PO Take 1 capsule by mouth 2 (two) times daily with a meal.   Super Probiotic Digestive Caps Take 1 tablet by mouth as needed (stomach upset). Take as needed with meals   tamsulosin 0.4 MG Caps capsule Commonly known as: FLOMAX Take 1 capsule (0.4 mg total) by mouth daily after supper.   zinc sulfate 220 (50 Zn) MG capsule Take 1 capsule (220 mg total) by mouth daily. Start taking on: May 03, 2021               Discharge Care Instructions  (From admission, onward)           Start     Ordered   05/02/21 0000  Discharge wound care:       Comments: May cleans stump with antibacterial sopa and water. Apply clean dry dressing and shrinker   05/02/21 0941            Discharge Exam: Filed Weights   04/16/21 1958  Weight: 90.7 kg   Vitals:   05/02/21 0829 05/02/21 0840  BP: 128/89   Pulse: 91   Resp: 16   Temp: 97.8 F (36.6 C)   SpO2: (!) 86% 100%   General: Appear in mild distress, no Rash; Oral Mucosa Clear, moist. no Abnormal Neck Mass Or lumps, Conjunctiva normal  Cardiovascular: S1 and S2 Present, no Murmur, Respiratory: good respiratory effort, Bilateral Air entry  present and CTA, no Crackles, no  wheezes Abdomen: Bowel Sound present, Soft and no tenderness, scrotal edema Extremities: trace Pedal edema, right upper extremity edema, no tenderness  Neurology: alert and oriented to time, place, and person affect appropriate. no new focal deficit Gait not checked due to patient safety concerns   The results of significant diagnostics from this hospitalization (including imaging, microbiology, ancillary and laboratory) are listed below for reference.    Significant Diagnostic Studies: DG Chest 2 View  Result Date: 04/15/2021 CLINICAL DATA:  Shortness of breath and edema. EXAM: CHEST - 2 VIEW COMPARISON:  October 07, 2020 FINDINGS: The heart size and mediastinal contours are within normal limits. Both lungs are clear. The visualized skeletal structures are unremarkable. IMPRESSION: No active cardiopulmonary disease. Electronically Signed   By: Virgina Norfolk M.D.   On: 04/15/2021 17:41   CT ABDOMEN PELVIS W CONTRAST  Result Date: 04/27/2021 CLINICAL DATA:  Diarrhea c peptide elevated with hypoglycemia and diarrhea, rule out intra-abdominal neuroendrocrine lesion EXAM: CT ABDOMEN AND PELVIS WITH CONTRAST TECHNIQUE: Multidetector CT imaging of the abdomen and pelvis was performed using the standard protocol following bolus administration of intravenous contrast. CONTRAST:  59m OMNIPAQUE IOHEXOL 350 MG/ML SOLN COMPARISON:  CT 01/21/2015 FINDINGS: Lower chest: Bilateral pleural effusions are partially included, at least moderate in size. Associated compressive atelectasis. Mild cardiomegaly. Hepatobiliary: No focal hepatic abnormality. There is mild periportal edema. Gallbladder is nondistended, no calcified gallstones. No biliary dilatation. Pancreas: Homogeneous enhancement. No evidence of pancreatic mass. No ductal dilatation. No focal peripancreatic fat stranding or inflammation. Spleen: Wedge-shaped hypodensity in the posterior upper spleen typical of splenic  infarct. No splenomegaly. Adrenals/Urinary Tract: Normal adrenal glands. No hydronephrosis. A least 3 right renal cysts. No solid renal lesion. Homogeneous enhancement with symmetric excretion on delayed phase imaging. No renal calculi. There is a Foley catheter within the urinary bladder which is partially distended. Diffuse circumferential bladder wall thickening greater than typically seen with nondistention. Stomach/Bowel: The stomach is decompressed. Administered enteric contrast reaches the colon, there is no bowel obstruction. No small bowel wall thickening or inflammation. No evidence of small bowel mass. The appendix is normal, courses into the central abdomen. Contrast within the ascending and transverse colon. Fluid and liquid stool within the descending and sigmoid colon. Minimal rectal wall thickening without additional colonic wall thickening. No definite pericolonic inflammation. Vascular/Lymphatic: Aortic atherosclerosis. No aortic aneurysm. Patent portal vein. Few prominent external iliac lymph nodes measuring 8 mm short axis, not enlarged by size criteria. No enlarged lymph nodes in the abdomen or pelvis. Reproductive: Prostate is unremarkable. Other: Prominent anasarca and body wall edema with skin thickening. Similar edema of the intra-abdominal fat. Small volume abdominopelvic ascites. No free air. There is no evidence of mesenteric mass. Musculoskeletal: Degenerative disc disease and facet hypertrophy at L4-L5. There are no acute or suspicious osseous abnormalities. IMPRESSION: 1. No evidence of neuroendocrine lesion. 2. Prominent anasarca and body wall edema. Small volume abdominopelvic ascites. Findings consistent with third-spacing. 3. Diffuse circumferential bladder wall thickening greater than typically seen with nondistention. This may be due to chronic bladder outlet obstruction, however recommend correlation with urinalysis to exclude urinary tract infection. 4. Small splenic infarct.  5. Bilateral pleural effusions are partially included, at least moderate in size. Associated compressive atelectasis. 6. Fluid and liquid stool within the descending and sigmoid colon, consistent with provided history of diarrhea. Minimal rectal wall thickening without additional colonic wall thickening. Aortic Atherosclerosis (ICD10-I70.0). Electronically Signed   By: MKeith RakeM.D.   On: 04/27/2021 23:47  DG Abd Portable 1V  Result Date: 04/30/2021 CLINICAL DATA:  Nausea, abdominal pain EXAM: PORTABLE ABDOMEN - 1 VIEW COMPARISON:  04/27/2021 FINDINGS: Two supine frontal views of the abdomen and pelvis are obtained. Bowel gas pattern is unremarkable with no evidence of obstruction or ileus. Gas and stool are seen throughout the colon to the level of the rectum. No masses or abnormal calcifications. Bilateral pleural effusions and dependent consolidation again noted. No acute bony abnormality. IMPRESSION: 1. Unremarkable bowel gas pattern. Electronically Signed   By: Randa Ngo M.D.   On: 04/30/2021 16:32   DG Foot 2 Views Left  Result Date: 04/16/2021 CLINICAL DATA:  Recent amputation.  Unable to get off of toilet. EXAM: LEFT FOOT - 2 VIEW COMPARISON:  04/06/2021 FINDINGS: Marked diffuse soft tissue swelling. Status post first and third amputation at the level of the MCP joints. Status post amputation of the second ray at the level of the head the metacarpal bone. Chronic neuropathic changes involving the hindfoot with collapse of the talar dome suspected There is a gas containing wound along the medial aspect of the midfoot overlying the navicular bone with signs of cortical destruction and erosion of the medial cortex of the navicular bone which appear progressive compared with 04/06/2021. IMPRESSION: 1. Findings compatible with progressive osteomyelitis of the medial aspect of the navicular bone with overlying soft tissue ulceration. 2. Marked diffuse soft tissue swelling. 3. Chronic advanced  arthropathic changes of the hindfoot, likely neuropathic. Electronically Signed   By: Kerby Moors M.D.   On: 04/16/2021 05:01   XR Foot 2 Views Left  Result Date: 04/10/2021 2 view radiographs of the left foot shows previous great toe and second ray and third toe amputation with no destructive bony changes through the talonavicular area.   Microbiology: Recent Results (from the past 240 hour(s))  SARS CORONAVIRUS 2 (TAT 6-24 HRS) Nasopharyngeal Nasopharyngeal Swab     Status: None   Collection Time: 04/25/21  1:10 PM   Specimen: Nasopharyngeal Swab  Result Value Ref Range Status   SARS Coronavirus 2 NEGATIVE NEGATIVE Final    Comment: (NOTE) SARS-CoV-2 target nucleic acids are NOT DETECTED.  The SARS-CoV-2 RNA is generally detectable in upper and lower respiratory specimens during the acute phase of infection. Negative results do not preclude SARS-CoV-2 infection, do not rule out co-infections with other pathogens, and should not be used as the sole basis for treatment or other patient management decisions. Negative results must be combined with clinical observations, patient history, and epidemiological information. The expected result is Negative.  Fact Sheet for Patients: SugarRoll.be  Fact Sheet for Healthcare Providers: https://www.woods-mathews.com/  This test is not yet approved or cleared by the [place] FDA and  has been authorized for detection and/or diagnosis of SARS-CoV-2 by FDA under an Emergency Use Authorization (EUA). This EUA will remain  in effect (meaning this test can be used) for the duration of the COVID-19 declaration under Se ction 564(b)(1) of the Act, 21 U.S.C. section 360bbb-3(b)(1), unless the authorization is terminated or revoked sooner.  Performed at [facility], [place] [address], [place], [place] [address]   SARS CORONAVIRUS 2 (TAT 6-24 HRS) Nasopharyngeal Nasopharyngeal Swab     Status:  None   Collection Time: 04/30/21  2:22 PM   Specimen: Nasopharyngeal Swab  Result Value Ref Range Status   SARS Coronavirus 2 NEGATIVE NEGATIVE Final    Comment: (NOTE) SARS-CoV-2 target nucleic acids are NOT DETECTED.  The SARS-CoV-2 RNA is generally  detectable in upper and lower respiratory specimens during the acute phase of infection. Negative results do not preclude SARS-CoV-2 infection, do not rule out co-infections with other pathogens, and should not be used as the sole basis for treatment or other patient management decisions. Negative results must be combined with clinical observations, patient history, and epidemiological information. The expected result is Negative.  Fact Sheet for Patients: SugarRoll.be  Fact Sheet for Healthcare Providers: https://www.woods-mathews.com/  This test is not yet approved or cleared by the [place] FDA and  has been authorized for detection and/or diagnosis of SARS-CoV-2 by FDA under an Emergency Use Authorization (EUA). This EUA will remain  in effect (meaning this test can be used) for the duration of the COVID-19 declaration under Se ction 564(b)(1) of the Act, 21 U.S.C. section 360bbb-3(b)(1), unless the authorization is terminated or revoked sooner.  Performed at [facility], [place] [address], [place], [place] [address]      Labs: CBC: Recent Labs  Lab 04/30/21 0654  WBC 3.7*  HGB 11.2*  HCT 36.0*  MCV 87.4  PLT [id number]   Basic Metabolic Panel: Recent Labs  Lab 04/26/21 1729 04/27/21 0641 04/27/21 1930 04/29/21 0043 04/30/21 0654 05/01/21 0304 05/01/21 1438  NA 140 139 136 138 137 134* 138  K 2.8* 3.2* 3.3* 3.2* 3.9 4.0 3.8  CL 107 108 107 104 109 106 102  CO2 '26 25 22 26 23 '$ 18* 26  GLUCOSE 101* 86 117* 112* 98 135* 97  BUN 42* 35* 29* 29* 31* 32* 28*  CREATININE 1.31* 1.08 1.10 1.15 1.02 1.11 1.11  CALCIUM 8.0* 7.7* 7.7* 7.8* 7.7* 7.6* 7.8*  MG 1.5* 1.6*  --   1.7 1.6* 1.6*  --   PHOS 1.9*  --   --   --   --   --   --    Liver Function Tests: Recent Labs  Lab 04/26/21 1729 04/27/21 0641  AST 74* 56*  ALT 28 27  ALKPHOS 170* 130*  BILITOT 0.6 0.4  PROT 5.9* 5.8*  ALBUMIN 1.9* 1.7*   CBG: Recent Labs  Lab 04/28/21 0427 04/28/21 0835 04/28/21 1028 04/28/21 1227 05/01/21 1341  GLUCAP 95 69* 113* 82 40*    Time spent: 35 minutes  Signed:  Berle Mull  [facility]  05/02/2021

## 2021-05-02 NOTE — Social Work (Signed)
CSW spoke to pts son by phone. Son requested a note saying pt was in hospital from 04/15/21- 05/02/21 and request it be faxed to the court house at [phone].   Emeterio Reeve, LCSW Clinical Social Worker

## 2021-05-02 NOTE — Progress Notes (Addendum)
Physical Therapy Treatment Patient Details Name: Stephen Whitaker MRN: [id number] DOB: Aug 11, 1956 Today's Date: 05/02/2021   History of Present Illness Stephen Whitaker is a 65 y.o.-year-old male who presented with osteomyelitis abscess and ulceration of the left heel. Underwent Lt transtibial amputation 04/21/21 with wound vac (admitted 04/15/21), post op having difficulties with hypoglycemia.  Pt with significant PMH of COPD, gout, HTN, mild mitral regurgitation, R 2nd toe amputation, multiple L LE amputations, CKD 3B, SVT.    PT Comments    Pt admitted with above diagnosis. Pt was able to scoot to a drop arm recliner with mod assist of 2. Pt tolerated this well with cues and assist.  Continues to be limited in ability to stand even with +2 assist.   Pt c/o dizziness with sitting at EOB BP 110/85 with HR of 100 bpm.  BP once in chair 98/77 with HR of 100 bpm.  Pt currently with functional limitations due to balance and endurance deficits. Pt will benefit from skilled PT to increase their independence and safety with mobility to allow discharge to the venue listed below.       Recommendations for follow up therapy are one component of a multi-disciplinary discharge planning process, led by the attending physician.  Recommendations may be updated based on patient status, additional functional criteria and insurance authorization.  Follow Up Recommendations  SNF     Equipment Recommendations  Wheelchair (measurements PT);Wheelchair cushion (measurements PT);3in1 (PT);Hospital bed    Recommendations for Other Services       Precautions / Restrictions Precautions Precautions: Fall Required Braces or Orthoses: Other Brace Splint/Cast: L limb protector Restrictions LLE Weight Bearing: Non weight bearing     Mobility  Bed Mobility Overal bed mobility: Needs Assistance Bed Mobility: Supine to Sit;Sit to Supine;Rolling Rolling: Min assist (with rail)   Supine to sit: HOB elevated;Mod  assist     General bed mobility comments: assist for trunk and guiding legs off bed, pt pulling up on rail, cues needed to push up on elbows.  Pt needing assist to get scooted out to EOB with use of pad.    Transfers Overall transfer level: Needs assistance   Transfers: Sit to/from Stand Sit to Stand: From elevated surface;Total assist;+2 physical assistance        Lateral/Scoot Transfers: Mod assist;+2 physical assistance;From elevated surface General transfer comment: unable to get up on R LE to stand with Stedy, but attempted with Tot A x 3 reps for technique & strengthening. Pt scooted to drop arm recliner with mod assist of 2 persons with use of pad with pt assisting well with cues.  Ambulation/Gait                 Stairs             Wheelchair Mobility    Modified Rankin (Stroke Patients Only)       Balance Overall balance assessment: Needs assistance Sitting-balance support: Feet supported Sitting balance-Leahy Scale: Fair Sitting balance - Comments: R foot supported, using UE's a lot initially to balance, then able to sit with minimal UE support and then used UE's to scoot hips to get back into bed Postural control: Posterior lean Standing balance support: Bilateral upper extremity supported;During functional activity Standing balance-Leahy Scale: Zero Standing balance comment: could not clear buttocks off of bed.                            Cognition Arousal/Alertness:  Awake/alert Behavior During Therapy: WFL for tasks assessed/performed Overall Cognitive Status: Within Functional Limits for tasks assessed                                        Exercises General Exercises - Lower Extremity Quad Sets: Strengthening;Both;10 reps;Supine Short Arc Quad: AROM;Left;10 reps;Supine Amputee Exercises Quad Sets: Both;10 reps;Supine Gluteal Sets: Strengthening;Both;10 reps;Supine Hip ABduction/ADduction: AROM;Both;10  reps;Supine;AAROM (AA on R)    General Comments General comments (skin integrity, edema, etc.): scrotal and LE edema throughout; pt soiled with loose stool with NT cleanign pt on arrival with PT and OT  assisted for rolling in bed for hygiene, RN called in to place new pad over R buttocks.      Pertinent Vitals/Pain Pain Assessment: Faces Faces Pain Scale: Hurts even more Pain Location: LLE Pain Descriptors / Indicators: Discomfort;Aching;Grimacing Pain Intervention(s): Limited activity within patient's tolerance;Monitored during session;Repositioned;Patient requesting pain meds-RN notified    Home Living                      Prior Function            PT Goals (current goals can now be found in the care plan section) Progress towards PT goals: Progressing toward goals    Frequency    Min 2X/week (would benefit from more)      PT Plan Current plan remains appropriate    Co-evaluation PT/OT/SLP Co-Evaluation/Treatment: Yes Reason for Co-Treatment: Complexity of the patient's impairments (multi-system involvement);For patient/therapist safety PT goals addressed during session: Mobility/safety with mobility        AM-PAC PT "6 Clicks" Mobility   Outcome Measure  Help needed turning from your back to your side while in a flat bed without using bedrails?: A Lot Help needed moving from lying on your back to sitting on the side of a flat bed without using bedrails?: A Lot Help needed moving to and from a bed to a chair (including a wheelchair)?: Total Help needed standing up from a chair using your arms (e.g., wheelchair or bedside chair)?: Total Help needed to walk in hospital room?: Total Help needed climbing 3-5 steps with a railing? : Total 6 Click Score: 8    End of Session Equipment Utilized During Treatment: Gait belt Activity Tolerance: Patient limited by fatigue Patient left: with call bell/phone within reach;in chair;with chair alarm set Nurse  Communication: Mobility status PT Visit Diagnosis: Muscle weakness (generalized) (M62.81);Pain;Other abnormalities of gait and mobility (R26.89) Pain - Right/Left: Left Pain - part of body: Leg     Time: [id number] PT Time Calculation (min) (ACUTE ONLY): 37 min  Charges:  $Therapeutic Activity: 8-22 mins                     Laquinta Hazell M,PT Acute Rehab Services [phone] (pager)    Alvira Philips 05/02/2021, 11:35 AM

## 2021-05-02 NOTE — Progress Notes (Signed)
Occupational Therapy Treatment Patient Details Name: Stephen Whitaker MRN: [id number] DOB: 06/08/1956 Today's Date: 05/02/2021   History of present illness Mr. Schuld is a 65 y.o.-year-old male who presented with osteomyelitis abscess and ulceration of the left heel. Underwent Lt transtibial amputation 04/21/21 with wound vac (admitted 04/15/21), post op having difficulties with hypoglycemia.  Pt with significant PMH of COPD, gout, HTN, mild mitral regurgitation, R 2nd toe amputation, multiple L LE amputations, CKD 3B, SVT.   OT comments  Patient supine in bed upon entry, agreeable to OT/PT session.  NT reports pt incontinent of BM this am, unaware of situation and requires total assist for hygiene.  Min assist to ensure cleanliness of hands, using pan to wash hands sitting at EOB. Attempted standing with stedy, total assist +2; deferred to lateral scoot with mod assist +2 to reach chair.  Reports dizziness in sitting, BP soft but stable and RN notified. Pt remains limited by weakness, pain and balance.  Some slow processing and verbal cueing required for sequencing/safety.  Updated dc recs to SNF.  Will follow acutely.    Recommendations for follow up therapy are one component of a multi-disciplinary discharge planning process, led by the attending physician.  Recommendations may be updated based on patient status, additional functional criteria and insurance authorization.    Follow Up Recommendations  SNF;Supervision/Assistance - 24 hour    Equipment Recommendations  Other (comment) (TBD)    Recommendations for Other Services      Precautions / Restrictions Precautions Precautions: Fall Required Braces or Orthoses: Other Brace Splint/Cast: L limb protector Restrictions Weight Bearing Restrictions: Yes LLE Weight Bearing: Non weight bearing       Mobility Bed Mobility Overal bed mobility: Needs Assistance Bed Mobility: Supine to Sit;Sit to Supine;Rolling Rolling: Min assist (with  rail)   Supine to sit: HOB elevated;Mod assist     General bed mobility comments: assist for trunk and guiding legs off bed, pt pulling up on rail, cues needed to push up on elbows.  Pt needing assist to get scooted out to EOB with use of pad.    Transfers Overall transfer level: Needs assistance   Transfers: Sit to/from Stand Sit to Stand: From elevated surface;Total assist;+2 physical assistance        Lateral/Scoot Transfers: Mod assist;+2 physical assistance;From elevated surface General transfer comment: unable to get up on R LE to stand with Stedy, but attempted with Tot A x 3 reps for technique & strengthening. Pt scooted to drop arm recliner with mod assist of 2 persons with use of pad with pt assisting well with cues.    Balance Overall balance assessment: Needs assistance Sitting-balance support: Feet supported Sitting balance-Leahy Scale: Fair Sitting balance - Comments: R foot supported, using UE's a lot initially to balance, then able to sit with minimal UE support and then used UE's to scoot hips to get back into bed Postural control: Posterior lean Standing balance support: Bilateral upper extremity supported;During functional activity Standing balance-Leahy Scale: Zero Standing balance comment: could not clear buttocks off of bed.                           ADL either performed or assessed with clinical judgement   ADL Overall ADL's : Needs assistance/impaired     Grooming: Minimal assistance;Sitting;Wash/dry hands Grooming Details (indicate cue type and reason): to ensure hygiene of hands         Upper Body Dressing : Set up;Sitting  Lower Body Dressing: Maximal assistance;Sitting/lateral leans;Bed level   Toilet Transfer: Moderate assistance;+2 for physical assistance;+2 for safety/equipment Toilet Transfer Details (indicate cue type and reason): lateral scoot simulated to recliner Toileting- Clothing Manipulation and Hygiene: Total  assistance;Bed level Toileting - Clothing Manipulation Details (indicate cue type and reason): incontient of bowel     Functional mobility during ADLs: Moderate assistance;+2 for physical assistance;+2 for safety/equipment General ADL Comments: pt limited by decreased strength, endurance, activity tolerance and balance     Vision       Perception     Praxis      Cognition Arousal/Alertness: Awake/alert Behavior During Therapy: WFL for tasks assessed/performed Overall Cognitive Status: No family/caregiver present to determine baseline cognitive functioning Area of Impairment: Awareness;Problem solving                           Awareness: Emergent Problem Solving: Slow processing;Requires verbal cues;Difficulty sequencing General Comments: some decreased problem sovling and requires verbal cueing for safety/seqeuncing, incontinent of bowel with no awareness today        Exercises Exercises: Amputee General Exercises - Lower Extremity Quad Sets: Strengthening;Both;10 reps;Supine Short Arc Quad: AROM;Left;10 reps;Supine Amputee Exercises Quad Sets: Both;10 reps;Supine Gluteal Sets: Strengthening;Both;10 reps;Supine Hip ABduction/ADduction: AROM;Both;10 reps;Supine;AAROM (AA on R)   Shoulder Instructions       General Comments scrotal and LE edema throughout; pt soiled with loose stool with NT cleanign pt on arrival with PT and OT  assisted for rolling in bed for hygiene, RN called in to place new pad over R buttocks. BP soft but stable    Pertinent Vitals/ Pain       Pain Assessment: Faces Faces Pain Scale: Hurts even more Pain Location: LLE Pain Descriptors / Indicators: Discomfort;Aching;Grimacing Pain Intervention(s): Limited activity within patient's tolerance;Monitored during session;Repositioned  Home Living                                          Prior Functioning/Environment              Frequency  Min 2X/week         Progress Toward Goals  OT Goals(current goals can now be found in the care plan section)  Progress towards OT goals: Progressing toward goals  Acute Rehab OT Goals Patient Stated Goal: Get stronger OT Goal Formulation: With patient  Plan Discharge plan needs to be updated;Frequency remains appropriate    Co-evaluation    PT/OT/SLP Co-Evaluation/Treatment: Yes Reason for Co-Treatment: Complexity of the patient's impairments (multi-system involvement);For patient/therapist safety PT goals addressed during session: Mobility/safety with mobility OT goals addressed during session: ADL's and self-care      AM-PAC OT "6 Clicks" Daily Activity     Outcome Measure   Help from another person eating meals?: A Little Help from another person taking care of personal grooming?: A Little Help from another person toileting, which includes using toliet, bedpan, or urinal?: Total Help from another person bathing (including washing, rinsing, drying)?: A Lot Help from another person to put on and taking off regular upper body clothing?: A Little Help from another person to put on and taking off regular lower body clothing?: A Lot 6 Click Score: 14    End of Session    OT Visit Diagnosis: Unsteadiness on feet (R26.81);Other abnormalities of gait and mobility (R26.89);Muscle weakness (generalized) (M62.81);Pain Pain - Right/Left:  Left Pain - part of body: Leg   Activity Tolerance Patient tolerated treatment well   Patient Left in chair;with call bell/phone within reach;with chair alarm set   Nurse Communication Mobility status        Time: [id number] OT Time Calculation (min): 37 min  Charges: OT General Charges $OT Visit: 1 Visit OT Treatments $Self Care/Home Management : 8-22 mins  Jolaine Artist, [place] Pager [phone] Office [phone]   Delight Stare 05/02/2021, 12:44 PM

## 2021-05-02 NOTE — TOC Transition Note (Signed)
Transition of Care [facility]) - CM/SW Discharge Note   Patient Details  Name: Stephen Whitaker MRN: [id number] Date of Birth: 04-24-1956  [facility] CM/SW Contact:  Emeterio Reeve, LCSW Phone Number: 05/02/2021, 12:27 PM   Clinical Narrative:     Patient will DC to: [facility] Anticipated DC date: 05/02/21 Transport by: Corey Harold     Per MD patient ready for DC to [facility]. RN, patient, patient's family, and facility notified of DC. Discharge Summary and FL2 sent to facility. DC packet on chart. Insurance Josem Kaufmann has been received and pt is covid negative. Ambulance transport requested for patient.    RN to call report to [phone].  CSW will sign off for now as social work intervention is no longer needed. Please consult [place] again if new needs arise.   Final next level of care: Homeless Shelter Barriers to Discharge: Barriers Resolved   Patient Goals and CMS Choice Patient states their goals for this hospitalization and ongoing recovery are:: to get better CMS Medicare.gov Compare Post Acute Care list provided to:: Patient Choice offered to / list presented to : Patient  Discharge Placement              Patient chooses bed at: Other - please specify in the comment section below: ([facility]) Patient to be transferred to facility by: ptar Name of family member notified: pt declined csw calling family Patient and family notified of of transfer: 04/26/21  Discharge Plan and Services     Post Acute Care Choice: [place]                               Social Determinants of Health (SDOH) Interventions Food Insecurity Interventions: Intervention Not Indicated Financial Strain Interventions: Other (Comment) (CSW consulted) Housing Interventions: Other (Comment) (air ducts have mold per pt statement.) Transportation Interventions: Intervention Not Indicated   Readmission Risk Interventions No flowsheet data found.   Emeterio Reeve,  LCSW Clinical Social Worker

## 2021-05-02 NOTE — Plan of Care (Signed)
  Problem: Education: Goal: Knowledge of General Education information will improve Description Including pain rating scale, medication(s)/side effects and non-pharmacologic comfort measures Outcome: Progressing   

## 2021-05-02 NOTE — Progress Notes (Signed)
Upper extremity venous RT study completed.  Preliminary results relayed to Posey Pronto, MD and Romie Minus, [place].  See CV Proc for preliminary results report.   Darlin Coco, RDMS, RVT

## 2021-05-02 NOTE — Progress Notes (Signed)
Nutrition Follow-up  DOCUMENTATION CODES:   Severe malnutrition in context of acute illness/injury  INTERVENTION:   -Continue Magic cup TID with meals, each supplement provides 290 kcal and 9 grams of protein  -Continue 30 ml Prosource Plus TID, each supplement provides 100 kcals and 15 grams protein -Continue 9000 mg lactase TID -Continue Hormel Shake TID, each supplement provides 520 kcals and 15 grams protein -Continue MVI with minerals daily -D/c Glucerna shake  NUTRITION DIAGNOSIS:   Severe Malnutrition related to acute illness (lt foot osteomyelitis) as evidenced by moderate fat depletion, moderate muscle depletion, edema.  Ongoing  GOAL:   Patient will meet greater than or equal to 90% of their needs  Progressing   MONITOR:   PO intake, Supplement acceptance, Diet advancement, Labs, Weight trends, Skin, I & O's  REASON FOR ASSESSMENT:   Consult Wound healing  ASSESSMENT:   Stephen Whitaker is a 65 y.o. male with medical history significant of COPD; chronic systolic CHF (EF 0000000); DM; and HTN presenting with foot infection.  He has been followed by Dr. Sharol Given for his chronic L Charcot foot ulceration and has been on doxy with wound care.  He was last seen on 8/25 and was planning for BKA this coming Wednesday (8/31).   The patient worked yesterday and drove home but was too tired to get out of the car and fell asleep in the driveway.  Neighbors helped him inside and then he was too weak to get off the commode and so he came to the ER.  He denied any additional physical concerns at the time of our discussion.  He did report unstable living situation (landlord is evicting him), need for assistance in obtaining short and long-term disability through his work, and probable need for placement after surgery since he does not have other available housing options.  9/2- s/p lt BKA 9/9- wound vac d/c  Reviewed I/O's: -800 ml x 24 hours and -10 L since 04/18/21  UOP: 1.1 L x  24 hours  Pt unavailable at time of visit.   Intake remains fair. Noted meal completions 25-50%. He is receiving lactase with meals secondary to lactose intolerance. Pt with poor tolerance of Glucerna (ordered by MD). He continues to take Prosource supplements.   Noted several hypoglycemia episodes. Per RN, pt often snacks throughout shift.   Per MD notes, plan to d/c to SNF today.   Medications reviewed and include vitamin C, florastor, and zinc sulfate.   Labs reviewed: CBGS: 64 (inpatient orders for glycemic control are none).    Diet Order:   Diet Order             Diet - low sodium heart healthy           Diet regular Room service appropriate? Yes; Fluid consistency: Thin  Diet effective now           Diet regular                   EDUCATION NEEDS:   Education needs have been addressed  Skin:  Skin Assessment: Skin Integrity Issues: Skin Integrity Issues:: Incisions Stage II: buttocks Wound Vac: d/c on 04/28/21 Diabetic Ulcer: - Incisions: s/p lt BKA on 04/21/21  Last BM:  05/02/21  Height:   Ht Readings from Last 1 Encounters:  04/16/21 '6\' 2"'$  (1.88 m)    Weight:   Wt Readings from Last 1 Encounters:  04/16/21 90.7 kg    Ideal Body Weight:  78.2 kg (  adjusted for lt BKA)  BMI:  Body mass index is 25.68 kg/m.  Estimated Nutritional Needs:   Kcal:  [id number]  Protein:  130-155 grams  Fluid:  > 2 L    Loistine Chance, RD, LDN, [place] Registered Dietitian II Certified Diabetes Care and Education Specialist Please refer to [facility] for RD and/or RD on-call/weekend/after hours pager

## 2021-05-03 ENCOUNTER — Ambulatory Visit: Payer: BC Managed Care – PPO | Admitting: Internal Medicine

## 2021-05-04 DIAGNOSIS — I7300 Raynaud's syndrome without gangrene: Secondary | ICD-10-CM | POA: Diagnosis not present

## 2021-05-04 DIAGNOSIS — E11610 Type 2 diabetes mellitus with diabetic neuropathic arthropathy: Secondary | ICD-10-CM | POA: Diagnosis not present

## 2021-05-04 DIAGNOSIS — E162 Hypoglycemia, unspecified: Secondary | ICD-10-CM | POA: Diagnosis not present

## 2021-05-04 DIAGNOSIS — E119 Type 2 diabetes mellitus without complications: Secondary | ICD-10-CM | POA: Diagnosis not present

## 2021-05-04 LAB — GLUCOSE, CAPILLARY: Glucose-Capillary: 26 mg/dL — CL (ref 70–99)

## 2021-05-05 ENCOUNTER — Encounter: Payer: Self-pay | Admitting: Family

## 2021-05-05 ENCOUNTER — Ambulatory Visit (INDEPENDENT_AMBULATORY_CARE_PROVIDER_SITE_OTHER): Payer: BC Managed Care – PPO | Admitting: Family

## 2021-05-05 ENCOUNTER — Other Ambulatory Visit: Payer: Self-pay

## 2021-05-05 ENCOUNTER — Telehealth: Payer: Self-pay | Admitting: Orthopedic Surgery

## 2021-05-05 DIAGNOSIS — Z89512 Acquired absence of left leg below knee: Secondary | ICD-10-CM

## 2021-05-05 NOTE — Progress Notes (Signed)
Post-Op Visit Note   Patient: Stephen Whitaker           Date of Birth: 23-Dec-1955           MRN: [id number] Visit Date: 05/05/2021 PCP: [name], NP  Chief Complaint:  Chief Complaint  Patient presents with   Left Leg - Routine Post Op    04/21/21 left BKA Wound vac removed in hospital 04/28/21 SNF photo obtained not wearing shrinker today has limb protector on     HPI:  HPI The patient is a 65 year old gentleman seen status post left below-knee amputation on September 2 he is residing at skilled nursing Ortho Exam The incision of his left residual limb is well approximated.  There is 1 drop of bloody drainage there is no erythema no drainage no edema  Visit Diagnoses: No diagnosis found.  Plan: Daily Dial soap cleansing.  Dry dressing changes.  Shrinker with direct skin contact please continue the limb protector.  Follow-up in 2 weeks for staple removal   Follow-Up Instructions: No follow-ups on file.   Imaging: No results found.  Orders:  No orders of the defined types were placed in this encounter.  No orders of the defined types were placed in this encounter.    PMFS History: Patient Active Problem List   Diagnosis Date Noted   Pressure injury of skin 04/17/2021   Protein-calorie malnutrition, severe 04/17/2021   Osteomyelitis of left foot ([place]) 04/16/2021   Diabetic foot infection ([place])    Fatigue 02/22/2021   Nonadherence to medication 02/15/2021   Asthma 01/30/2021   Other adverse food reactions, not elsewhere classified, subsequent encounter 01/30/2021   LVH (left ventricular hypertrophy) 01/04/2021   Acute on chronic systolic heart failure ([place]) 12/08/2020   HFrEF (heart failure with reduced ejection fraction) ([place]) 10/14/2020   Nonspecific abnormal electrocardiogram (ECG) (EKG) 10/14/2020   Atrial tachycardia ([place]) 10/14/2020   Non-pressure chronic ulcer of other part of left foot limited to breakdown of skin ([place]) 10/12/2020   Leg swelling  10/06/2020   DOE (dyspnea on exertion) 10/06/2020   Thrombocytopenia ([place]) 06/26/2019   Acquired absence of other right toe(s) ([place]) 02/19/2018   Boil of buttock 10/24/2016   Amputated toe of left foot ([place]) 10/19/2016   Amputated toe, right ([place]) 06/13/2016   Chronic bronchitis ([place]) 11/24/2015   Other pancytopenia ([place]) 01/24/2015   Chronic pancreatitis ([place]) 01/24/2015   Blood poisoning    Neutropenia ([place])    Iron deficiency anemia due to chronic blood loss    Leukopenia    Sepsis ([place]) 01/21/2015   Leucopenia 01/21/2015   Anemia 01/21/2015   Syncope 01/21/2015   Diarrhea 01/21/2015   Hypokalemia 01/21/2015   Loss of consciousness ([place])    Eczema 10/21/2014   Essential hypertension 10/21/2014   Vasovagal syncope 10/21/2014   Allergic conjunctivitis of both eyes 08/10/2014   Other chronic pancreatitis ([place]) 08/10/2014   COLD (chronic obstructive lung disease) ([place]) 08/10/2014   Chronic ethmoidal sinusitis 08/10/2014   Bronchitis 10/17/2010   PLANTAR FASCIITIS, LEFT 10/17/2010   OTHER NEUTROPENIA 04/21/2010   CALLUS, TOE 03/15/2010   LEUKOPENIA, MILD 02/20/2010   LIVER FUNCTION TESTS, ABNORMAL, HX OF 02/16/2010   DENTAL CARIES 02/15/2010   ONYCHOMYCOSIS 07/05/2009   ALLERGIC URTICARIA 07/05/2009   PAIN IN LIMB 07/05/2009   ERECTILE DYSFUNCTION 05/25/2008   INFLUENZA 05/25/2008   VIRAL INFECTION 10/01/2007   HSV 05/02/2007   CONDYLOMA ACUMINATUM 05/02/2007   HYPERTENSION 05/02/2007   Other allergic rhinitis 05/02/2007  Chronic asthma, mild persistent, uncomplicated Q000111Q   Gastroesophageal reflux disease without esophagitis 05/02/2007   PANCREATITIS 05/02/2007   Past Medical History:  Diagnosis Date   Abnormal liver function tests    Arthritis    Boil of buttock    COPD (chronic obstructive pulmonary disease) (HCC)    Chronic Bronchitis.Advair inhaler    Dyspnea    GERD (gastroesophageal reflux disease)    takes Protonix daily   Gout    takes  Allopurinol daily   Hemorrhoids    Hypertension    takes HCTZ daily   Hypomagnesemia    Leg ulcer (HCC)    Mild mitral regurgitation    Pancreatitis    10 years ago   Pneumonia 2016   Tricuspid regurgitation    Tubular adenoma of colon     Family History  Adopted: Yes  Problem Relation Age of Onset   Cancer Sister 11       unknown origin     Past Surgical History:  Procedure Laterality Date   AMPUTATION Left 07/14/2014   Procedure: 2nd Toe Amputation;  Surgeon: [name], MD;  Location: [facility];  Service: Orthopedics;  Laterality: Left;   AMPUTATION Left 11/16/2015   Procedure: Left Great Toe Amputation at Metatarsophalangeal Joint;  Surgeon: [name], MD;  Location: [facility];  Service: Orthopedics;  Laterality: Left;   AMPUTATION Right 06/13/2016   Procedure: RIGHT 2nd Toe Amputation;  Surgeon: [name], MD;  Location: [facility];  Service: Orthopedics;  Laterality: Right;   AMPUTATION Left 10/19/2016   Procedure: Amputation Left 3rd Toe;  Surgeon: [name], MD;  Location: [facility];  Service: Orthopedics;  Laterality: Left;   AMPUTATION Left 04/21/2021   Procedure: LEFT BELOW KNEE AMPUTATION;  Surgeon: [name], MD;  Location: [facility];  Service: Orthopedics;  Laterality: Left;   RIGHT/LEFT HEART CATH AND CORONARY ANGIOGRAPHY N/A 12/12/2020   Procedure: RIGHT/LEFT HEART CATH AND CORONARY ANGIOGRAPHY;  Surgeon: Lorretta Harp, MD;  Location: [facility];  Service: Cardiovascular;  Laterality: N/A;   TOE AMPUTATION Left 11/16/2015   left great toe    TOE AMPUTATION Right 06/13/2016   TONSILLECTOMY     Social History   Occupational History   Not on file  Tobacco Use   Smoking status: Never   Smokeless tobacco: Never  Vaping Use   Vaping Use: Never used  Substance and Sexual Activity   Alcohol use: Yes    Alcohol/week: 0.0 standard drinks    Comment: rarely   Drug use: No   Sexual activity: Not Currently

## 2021-05-08 ENCOUNTER — Ambulatory Visit (INDEPENDENT_AMBULATORY_CARE_PROVIDER_SITE_OTHER): Payer: BC Managed Care – PPO | Admitting: Allergy

## 2021-05-08 ENCOUNTER — Telehealth (HOSPITAL_COMMUNITY): Payer: Self-pay

## 2021-05-08 ENCOUNTER — Encounter: Payer: Self-pay | Admitting: Allergy

## 2021-05-08 ENCOUNTER — Other Ambulatory Visit: Payer: Self-pay

## 2021-05-08 DIAGNOSIS — J302 Other seasonal allergic rhinitis: Secondary | ICD-10-CM

## 2021-05-08 DIAGNOSIS — T781XXD Other adverse food reactions, not elsewhere classified, subsequent encounter: Secondary | ICD-10-CM

## 2021-05-08 DIAGNOSIS — J449 Chronic obstructive pulmonary disease, unspecified: Secondary | ICD-10-CM | POA: Diagnosis not present

## 2021-05-08 DIAGNOSIS — H1013 Acute atopic conjunctivitis, bilateral: Secondary | ICD-10-CM

## 2021-05-08 DIAGNOSIS — B370 Candidal stomatitis: Secondary | ICD-10-CM | POA: Diagnosis not present

## 2021-05-08 DIAGNOSIS — R630 Anorexia: Secondary | ICD-10-CM | POA: Diagnosis not present

## 2021-05-08 DIAGNOSIS — J3089 Other allergic rhinitis: Secondary | ICD-10-CM

## 2021-05-08 DIAGNOSIS — J45909 Unspecified asthma, uncomplicated: Secondary | ICD-10-CM | POA: Diagnosis not present

## 2021-05-08 DIAGNOSIS — H1010 Acute atopic conjunctivitis, unspecified eye: Secondary | ICD-10-CM | POA: Insufficient documentation

## 2021-05-08 DIAGNOSIS — Z79899 Other long term (current) drug therapy: Secondary | ICD-10-CM | POA: Diagnosis not present

## 2021-05-08 MED ORDER — FLUTICASONE PROPIONATE 50 MCG/ACT NA SUSP: 1.0000 | Freq: Two times a day (BID) | NASAL | 5 refills | Status: DC | PRN

## 2021-05-08 MED ORDER — EPINEPHRINE 0.3 MG/0.3ML IJ SOAJ: 0.3000 mg | INTRAMUSCULAR | 1 refills | Status: DC | PRN

## 2021-05-08 MED ORDER — MONTELUKAST SODIUM 10 MG PO TABS: 10.0000 mg | ORAL_TABLET | Freq: Every day | ORAL | 2 refills | Status: DC

## 2021-05-08 NOTE — Assessment & Plan Note (Signed)
Thrush on tongue. Apparently he was being treated for this at the hospital but not on any medications currently. . Recommend nystatin swish and swallow 4 times a day for 7 days.

## 2021-05-08 NOTE — Assessment & Plan Note (Signed)
Past history - Diagnosed with asthma over 30+ years ago. Followed by pulmonology. Usually requires one course of prednisone and antibiotics per year for "bronchitis" flare. 2022 spirometry showed: Some restriction with no improvement in FEV1 post bronchodilator treatment.  Clinically feeling slightly improved. Interim history - has not seen pulmonology since last OV. Receiving generic Advair 573mcg 1 puff BID at current facility. . Continue to follow up with pulmonology and their recommendations.  . Daily controller medication(s): continue Symbicort 147mcg 2 puffs twice a day with spacer and rinse mouth afterwards OR generic Advair 585mcg 1 puff BID and rinse mouth after each use.  . Continue Singulair (montelukast) 10mg  daily at night. . May use albuterol rescue inhaler 2 puffs every 4 to 6 hours as needed for shortness of breath, chest tightness, coughing, and wheezing. May use albuterol rescue inhaler 2 puffs 5 to 15 minutes prior to strenuous physical activities. Monitor frequency of use.

## 2021-05-08 NOTE — Assessment & Plan Note (Signed)
Past history - Perennial rhinoconjunctivitis symptoms for 50 years.  2019 blood work was positive to dust mites, mold, cat, dog, cockroach, trees, grass, ragweed.  IgE 905. 2022 skin testing showed: Positive to grass, weed pollen, ragweed, trees, mold, dust mites, cat, cockroach. Interim history - stable. Patient has too much medical issue going on and can't commit to AIT at this time.  Continue environmental control measures as below.  Continue Singulair (montelukast) 10mg  daily at night.  Use over the counter antihistamines such as Zyrtec (cetirizine), Claritin (loratadine), Allegra (fexofenadine), or Xyzal (levocetirizine) daily as needed. May take twice a day during allergy flares. May switch antihistamines every few months.  Use Flonase (fluticasone) nasal spray 1 spray per nostril twice a day as needed for nasal congestion.   Use azelastine nasal spray 1-2 sprays per nostril twice a day as needed for runny nose/drainage.  Nasal saline spray (i.e., Simply Saline) or nasal saline lavage (i.e., NeilMed) is recommended as needed and prior to medicated nasal sprays.  Use ketotifen eye drops once a day as needed for itchy/watery eyes.  Consider allergy injections for long term control if above medications do not help the symptoms.

## 2021-05-08 NOTE — Telephone Encounter (Signed)
Heart Failure Nurse Navigator Progress Note  Attempted to call for appointment confirmation. No answer on phone number provided, vm full. Attempted to call [facility] at [phone]: no answer x 2.   Pricilla Holm, MSN, RN Heart Failure Nurse Navigator [phone]

## 2021-05-08 NOTE — Assessment & Plan Note (Signed)
Past history - Shrimp caused vomiting; lobster caused vomiting, hand swelling and lost consciousness; oysters cause vomiting. Tolerates finned fish. 2022 skin testing showed: Positive to shellfish and borderline to mollusks.  Interim history - needs new Rx for Epipen. No reactions.  . Continue strict avoidance of shellfish and mollusks (oysters, scallops, mussels) . I have prescribed epinephrine injectable device and demonstrated proper use. For mild symptoms you can take over the counter antihistamines such as Benadryl and monitor symptoms closely. If symptoms worsen or if you have severe symptoms including breathing issues, throat closure, significant swelling, whole body hives, severe diarrhea and vomiting, lightheadedness then inject epinephrine and seek immediate medical care afterwards. . Action plan in place.

## 2021-05-08 NOTE — Progress Notes (Signed)
Follow Up Note  RE: Stephen Whitaker MRN: [id number] DOB: 1956-03-28 Date of Office Visit: 05/08/2021  Referring provider: [name], NP Primary care provider: [name], NP  Chief Complaint: follow up  History of Present Illness: I had the pleasure of seeing Stephen Whitaker for a follow up visit at the [facility] on 05/08/2021. He is a 65 y.o. male, who is being followed for allergic rhino conjunctivitis, asthma, adverse food reaction. His previous allergy office visit was on 01/30/2021 with Dr. [name]. Today is a regular follow up visit.  Patient is in skilled nursing after his left below knee amputation on 04/21/21 and doing well. He also states that he was evicted from his home while in the hospital and not sure where he will be going upon discharge.   Allergic rhino conjunctivitis  Currently on Singulair daily, Flonase 1 spray per nostril once a day. No nosebleeds.  Using ketotifen eye drops once a day with good benefit.  This regimen seems to control his symptoms.  Can't commit to allergy injections at this time due to time constraints.   Asthma Denies any ER/urgent care visits or prednisone use since the last visit. He has not seen pulmonology recently. Denies any symptoms.  Taking maintenance ICS/LABA (Symbicort or Advair) twice a day.  Food allergy Currently avoiding shellfish and mollusks.  Patient was not able to pick up Epipen due to cost the last time. No reactions since the last OV.  Requesting some refills to be sent in so he will have medications when discharged.   Assessment and Plan: [name] is a 65 y.o. male with: Seasonal and perennial allergic rhinoconjunctivitis Past history - Perennial rhinoconjunctivitis symptoms for 50 years.  2019 blood work was positive to dust mites, mold, cat, dog, cockroach, trees, grass, ragweed.  IgE 905. 2022 skin testing showed: Positive to grass, weed pollen, ragweed, trees, mold, dust  mites, cat, cockroach. Interim history - stable. Patient has too much medical issue going on and can't commit to AIT at this time. Continue environmental control measures as below. Continue Singulair (montelukast) '10mg'$  daily at night. Use over the counter antihistamines such as Zyrtec (cetirizine), Claritin (loratadine), Allegra (fexofenadine), or Xyzal (levocetirizine) daily as needed. May take twice a day during allergy flares. May switch antihistamines every few months. Use Flonase (fluticasone) nasal spray 1 spray per nostril twice a day as needed for nasal congestion.  Use azelastine nasal spray 1-2 sprays per nostril twice a day as needed for runny nose/drainage. Nasal saline spray (i.e., Simply Saline) or nasal saline lavage (i.e., NeilMed) is recommended as needed and prior to medicated nasal sprays. Use ketotifen eye drops once a day as needed for itchy/watery eyes. Consider allergy injections for long term control if above medications do not help the symptoms.  Oral thrush Thrush on tongue. Apparently he was being treated for this at the hospital but not on any medications currently. Recommend nystatin swish and swallow 4 times a day for 7 days.  Other adverse food reactions, not elsewhere classified, subsequent encounter Past history - Shrimp caused vomiting; lobster caused vomiting, hand swelling and lost consciousness; oysters cause vomiting. Tolerates finned fish. 2022 skin testing showed: Positive to shellfish and borderline to mollusks.  Interim history - needs new Rx for Epipen. No reactions.  Continue strict avoidance of shellfish and mollusks (oysters, scallops, mussels) I have prescribed epinephrine injectable device and demonstrated proper use. For mild symptoms you can take over the counter antihistamines  such as Benadryl and monitor symptoms closely. If symptoms worsen or if you have severe symptoms including breathing issues, throat closure, significant swelling, whole body  hives, severe diarrhea and vomiting, lightheadedness then inject epinephrine and seek immediate medical care afterwards. Action plan in place.   Asthma Past history - Diagnosed with asthma over 30+ years ago. Followed by pulmonology. Usually requires one course of prednisone and antibiotics per year for "bronchitis" flare. 2022 spirometry showed: Some restriction with no improvement in FEV1 post bronchodilator treatment.  Clinically feeling slightly improved. Interim history - has not seen pulmonology since last OV. Receiving generic Advair 527mg 1 puff BID at current facility. Continue to follow up with pulmonology and their recommendations.  Daily controller medication(s): continue Symbicort 1659m 2 puffs twice a day with spacer and rinse mouth afterwards OR generic Advair 500109m1 puff BID and rinse mouth after each use.  Continue Singulair (montelukast) '10mg'$  daily at night. May use albuterol rescue inhaler 2 puffs every 4 to 6 hours as needed for shortness of breath, chest tightness, coughing, and wheezing. May use albuterol rescue inhaler 2 puffs 5 to 15 minutes prior to strenuous physical activities. Monitor frequency of use.   Return in about 4 months (around 09/07/2021).  Meds ordered this encounter  Medications   EPINEPHrine 0.3 mg/0.3 mL IJ SOAJ injection    Sig: Inject 0.3 mg into the muscle as needed for anaphylaxis.    Dispense:  1 each    Refill:  1    May dispense generic/Mylan/Teva brand.   montelukast (SINGULAIR) 10 MG tablet    Sig: Take 1 tablet (10 mg total) by mouth at bedtime.    Dispense:  90 tablet    Refill:  2   fluticasone (FLONASE) 50 MCG/ACT nasal spray    Sig: Place 1 spray into both nostrils 2 (two) times daily as needed (nasal congestion).    Dispense:  16 g    Refill:  5    Lab Orders  No laboratory test(s) ordered today    Diagnostics: None.  Medication List:  Current Outpatient Medications  Medication Sig Dispense Refill   EPINEPHrine 0.3  mg/0.3 mL IJ SOAJ injection Inject 0.3 mg into the muscle as needed for anaphylaxis. 1 each 1   fluticasone (FLONASE) 50 MCG/ACT nasal spray Place 1 spray into both nostrils 2 (two) times daily as needed (nasal congestion). 16 g 5   montelukast (SINGULAIR) 10 MG tablet Take 1 tablet (10 mg total) by mouth at bedtime. 90 tablet 2   acetaminophen (TYLENOL) 325 MG tablet Take 2 tablets (650 mg total) by mouth every 6 (six) hours as needed for mild pain (or Fever >/= 101).     albuterol (VENTOLIN HFA) 108 (90 Base) MCG/ACT inhaler Inhale 2 puffs into the lungs every 4 (four) hours as needed for wheezing or shortness of breath. 54 g 1   ascorbic acid (VITAMIN C) 1000 MG tablet Take 1 tablet (1,000 mg total) by mouth daily. 30 tablet 0   azelastine (ASTELIN) 0.1 % nasal spray Place 1-2 sprays into both nostrils 2 (two) times daily as needed (nasal drainage). Use in each nostril as directed (Patient not taking: No sig reported) 30 mL 5   budesonide-formoterol (SYMBICORT) 160-4.5 MCG/ACT inhaler USE 2 INHALATIONS TWICE A DAY 30.6 g 3   furosemide (LASIX) 20 MG tablet Take 1 tablet (20 mg total) by mouth daily. 30 tablet 11   HYDROcodone-acetaminophen (NORCO/VICODIN) 5-325 MG tablet Take 1-2 tablets by mouth every 6 (six)  hours as needed for moderate pain or severe pain. 20 tablet 0   lactase (LACTAID) 3000 units tablet Take 3 tablets (9,000 Units total) by mouth 3 (three) times daily with meals. 180 tablet 0   loperamide (IMODIUM) 2 MG capsule Take 1 capsule (2 mg total) by mouth as needed for diarrhea or loose stools. 30 capsule 0   metoprolol succinate (TOPROL XL) 25 MG 24 hr tablet Take 1 tablet (25 mg total) by mouth daily. 90 tablet 3   MITIGARE 0.6 MG CAPS Take 1 capsule by mouth as needed. (Patient not taking: No sig reported)     mometasone-formoterol (DULERA) 200-5 MCG/ACT AERO Inhale 2 puffs into the lungs daily as needed for wheezing.     Multiple Vitamin (MULTIVITAMIN WITH MINERALS) TABS tablet  Take 1 tablet by mouth daily. 150 tablet 0   Nutritional Supplements (,FEEDING SUPPLEMENT, PROSOURCE PLUS) liquid Take 30 mLs by mouth 3 (three) times daily between meals. 900 mL 0   Olopatadine HCl 0.2 % SOLN Apply 1 drop to eye daily as needed (itchy/watery eyes). (Patient not taking: No sig reported) 2.5 mL 5   pantoprazole (PROTONIX) 40 MG tablet TAKE ONE TABLET BY MOUTH 30 TO 60 MINUTES BEFORE YOUR FIRST AND LAST MEALS OF THE DAY (Patient taking differently: Take 40 mg by mouth 2 (two) times daily.) 180 tablet 3   polyethylene glycol (MIRALAX / GLYCOLAX) 17 g packet Take 17 g by mouth daily as needed for mild constipation. 14 each 0   Probiotic Product (SUPER PROBIOTIC DIGESTIVE) CAPS Take 1 tablet by mouth as needed (stomach upset). Take as needed with meals     saccharomyces boulardii (FLORASTOR) 250 MG capsule Take 1 capsule (250 mg total) by mouth 2 (two) times daily. 30 capsule 0   sacubitril-valsartan (ENTRESTO) 24-26 MG Take 1 tablet by mouth 2 (two) times daily. 60 tablet 2   SPIRULINA PO Take 1 capsule by mouth 2 (two) times daily with a meal.     tamsulosin (FLOMAX) 0.4 MG CAPS capsule Take 1 capsule (0.4 mg total) by mouth daily after supper. 30 capsule 0   Trolamine Salicylate (BLUE-EMU HEMP EX) Apply 1 application topically daily as needed.     zinc sulfate 220 (50 Zn) MG capsule Take 1 capsule (220 mg total) by mouth daily. 30 capsule 0   No current facility-administered medications for this visit.   Allergies: Allergies  Allergen Reactions   Ciprofloxacin Rash and Other (See Comments)    SYNCOPE    Lactose Intolerance (Gi) Diarrhea   Shellfish Allergy Anaphylaxis   Sulfa Antibiotics Other (See Comments)    SYNCOPE DIZZINESS    I reviewed his past medical history, social history, family history, and environmental history and no significant changes have been reported from his previous visit.  Review of Systems  Constitutional:  Positive for appetite change. Negative for  chills, fever and unexpected weight change.  HENT:  Negative for congestion, postnasal drip and rhinorrhea.   Eyes:  Negative for itching.  Respiratory:  Negative for cough, shortness of breath and wheezing.   Cardiovascular:  Negative for chest pain.  Gastrointestinal:  Negative for abdominal pain, diarrhea and nausea.  Genitourinary:  Negative for difficulty urinating.  Skin:  Negative for rash.  Allergic/Immunologic: Positive for environmental allergies and food allergies.  Neurological:  Negative for headaches.   Objective: There were no vitals taken for this visit. There is no height or weight on file to calculate BMI. Physical Exam Vitals and nursing note  reviewed.  Constitutional:      Appearance: He is well-developed.     Comments: In wheelchair  HENT:     Head: Normocephalic and atraumatic.     Right Ear: Tympanic membrane and external ear normal.     Left Ear: Tympanic membrane and external ear normal.     Nose: Nose normal.     Mouth/Throat:     Mouth: Mucous membranes are moist.     Comments: Thrush on tongue Eyes:     Conjunctiva/sclera: Conjunctivae normal.  Cardiovascular:     Rate and Rhythm: Normal rate and regular rhythm.     Heart sounds: Normal heart sounds. No murmur heard.   No friction rub. No gallop.  Pulmonary:     Effort: Pulmonary effort is normal.     Breath sounds: Normal breath sounds. No wheezing, rhonchi or rales.  Musculoskeletal:     Cervical back: Neck supple.  Skin:    General: Skin is warm.     Findings: No rash.  Neurological:     Mental Status: He is alert.  Psychiatric:        Behavior: Behavior normal.   Previous notes and tests were reviewed. The plan was reviewed with the patient/family, and all questions/concerned were addressed.  It was my pleasure to see Ashtyn today and participate in his care. Please feel free to contact me with any questions or concerns.  Sincerely,  Rexene Alberts, DO Allergy & Immunology  [facility] office: [place] office: [phone]

## 2021-05-08 NOTE — Patient Instructions (Addendum)
Thrush: Recommend nystatin swish and swallow 4 times a day for 7 days.  Environmental allergies 2022 skin testing showed: Positive to grass, weed pollen, ragweed, trees, mold, dust mites, cat, cockroach. Positive to shellfish and borderline to mollusks.  Continue environmental control measures as below. Continue Singulair (montelukast) '10mg'$  daily at night. Use over the counter antihistamines such as Zyrtec (cetirizine), Claritin (loratadine), Allegra (fexofenadine), or Xyzal (levocetirizine) daily as needed. May take twice a day during allergy flares. May switch antihistamines every few months. Use Flonase (fluticasone) nasal spray 1 spray per nostril twice a day as needed for nasal congestion.  Use azelastine nasal spray 1-2 sprays per nostril twice a day as needed for runny nose/drainage. Nasal saline spray (i.e., Simply Saline) or nasal saline lavage (i.e., NeilMed) is recommended as needed and prior to medicated nasal sprays. Use ketotifen eye drops once a day as needed for itchy/watery eyes. Consider allergy injections for long term control if above medications do not help the symptoms - handout given.   Food allergies: Continue strict avoidance of shellfish and mollusks (oysters, scallops, mussels) I have prescribed epinephrine injectable device and demonstrated proper use. For mild symptoms you can take over the counter antihistamines such as Benadryl and monitor symptoms closely. If symptoms worsen or if you have severe symptoms including breathing issues, throat closure, significant swelling, whole body hives, severe diarrhea and vomiting, lightheadedness then inject epinephrine and seek immediate medical care afterwards. Action plan in place.   Breathing: Continue to follow up with pulmonology and their recommendations.  Daily controller medication(s): continue Symbicort 132mg 2 puffs twice a day with spacer and rinse mouth afterwards. Continue Singulair (montelukast) '10mg'$  daily at  night. May use albuterol rescue inhaler 2 puffs every 4 to 6 hours as needed for shortness of breath, chest tightness, coughing, and wheezing. May use albuterol rescue inhaler 2 puffs 5 to 15 minutes prior to strenuous physical activities. Monitor frequency of use.  Breathing control goals:  Full participation in all desired activities (may need albuterol before activity) Albuterol use two times or less a week on average (not counting use with activity) Cough interfering with sleep two times or less a month Oral steroids no more than once a year No hospitalizations  Follow up in 4 months or sooner if needed.   Reducing Pollen Exposure Pollen seasons: trees (spring), grass (summer) and ragweed/weeds (fall). Keep windows closed in your home and car to lower pollen exposure.  Install air conditioning in the bedroom and throughout the house if possible.  Avoid going out in dry windy days - especially early morning. Pollen counts are highest between 5 - 10 AM and on dry, hot and windy days.  Save outside activities for late afternoon or after a heavy rain, when pollen levels are lower.  Avoid mowing of grass if you have grass pollen allergy. Be aware that pollen can also be transported indoors on people and pets.  Dry your clothes in an automatic dryer rather than hanging them outside where they might collect pollen.  Rinse hair and eyes before bedtime.  Mold Control Mold and fungi can grow on a variety of surfaces provided certain temperature and moisture conditions exist.  Outdoor molds grow on plants, decaying vegetation and soil. The major outdoor mold, Alternaria and Cladosporium, are found in very high numbers during hot and dry conditions. Generally, a late summer - fall peak is seen for common outdoor fungal spores. Rain will temporarily lower outdoor mold spore count, but counts rise rapidly when  the rainy period ends. The most important indoor molds are Aspergillus and Penicillium.  Dark, humid and poorly ventilated basements are ideal sites for mold growth. The next most common sites of mold growth are the bathroom and the kitchen. Outdoor (Seasonal) Mold Control Use air conditioning and keep windows closed. Avoid exposure to decaying vegetation. Avoid leaf raking. Avoid grain handling. Consider wearing a face mask if working in moldy areas.  Indoor (Perennial) Mold Control  Maintain humidity below 50%. Get rid of mold growth on hard surfaces with water, detergent and, if necessary, 5% bleach (do not mix with other cleaners). Then dry the area completely. If mold covers an area more than 10 square feet, consider hiring an indoor environmental professional. For clothing, washing with soap and water is best. If moldy items cannot be cleaned and dried, throw them away. Remove sources e.g. contaminated carpets. Repair and seal leaking roofs or pipes. Using dehumidifiers in damp basements may be helpful, but empty the water and clean units regularly to prevent mildew from forming. All rooms, especially basements, bathrooms and kitchens, require ventilation and cleaning to deter mold and mildew growth. Avoid carpeting on concrete or damp floors, and storing items in damp areas. Control of House Dust Mite Allergen Dust mite allergens are a common trigger of allergy and asthma symptoms. While they can be found throughout the house, these microscopic creatures thrive in warm, humid environments such as bedding, upholstered furniture and carpeting. Because so much time is spent in the bedroom, it is essential to reduce mite levels there.  Encase pillows, mattresses, and box springs in special allergen-proof fabric covers or airtight, zippered plastic covers.  Bedding should be washed weekly in hot water (130 F) and dried in a hot dryer. Allergen-proof covers are available for comforters and pillows that can't be regularly washed.  Wash the allergy-proof covers every few months.  Minimize clutter in the bedroom. Keep pets out of the bedroom.  Keep humidity less than 50% by using a dehumidifier or air conditioning. You can buy a humidity measuring device called a hygrometer to monitor this.  If possible, replace carpets with hardwood, linoleum, or washable area rugs. If that's not possible, vacuum frequently with a vacuum that has a HEPA filter. Remove all upholstered furniture and non-washable window drapes from the bedroom. Remove all non-washable stuffed toys from the bedroom.  Wash stuffed toys weekly. Pet Allergen Avoidance: Contrary to popular opinion, there are no "hypoallergenic" breeds of dogs or cats. That is because people are not allergic to an animal's hair, but to an allergen found in the animal's saliva, dander (dead skin flakes) or urine. Pet allergy symptoms typically occur within minutes. For some people, symptoms can build up and become most severe 8 to 12 hours after contact with the animal. People with severe allergies can experience reactions in public places if dander has been transported on the pet owners' clothing. Keeping an animal outdoors is only a partial solution, since homes with pets in the yard still have higher concentrations of animal allergens. Before getting a pet, ask your allergist to determine if you are allergic to animals. If your pet is already considered part of your family, try to minimize contact and keep the pet out of the bedroom and other rooms where you spend a great deal of time. As with dust mites, vacuum carpets often or replace carpet with a hardwood floor, tile or linoleum. High-efficiency particulate air (HEPA) cleaners can reduce allergen levels over time. While dander and  saliva are the source of cat and dog allergens, urine is the source of allergens from rabbits, hamsters, mice and [place] pigs; so ask a non-allergic family member to clean the animal's cage. If you have a pet allergy, talk to your allergist about the  potential for allergy immunotherapy (allergy shots). This strategy can often provide long-term relief. Cockroach Allergen Avoidance Cockroaches are often found in the homes of densely populated urban areas, schools or commercial buildings, but these creatures can lurk almost anywhere. This does not mean that you have a dirty house or living area. Block all areas where roaches can enter the home. This includes crevices, wall cracks and windows.  Cockroaches need water to survive, so fix and seal all leaky faucets and pipes. Have an exterminator go through the house when your family and pets are gone to eliminate any remaining roaches. Keep food in lidded containers and put pet food dishes away after your pets are done eating. Vacuum and sweep the floor after meals, and take out garbage and recyclables. Use lidded garbage containers in the kitchen. Wash dishes immediately after use and clean under stoves, refrigerators or toasters where crumbs can accumulate. Wipe off the stove and other kitchen surfaces and cupboards regularly.

## 2021-05-09 ENCOUNTER — Ambulatory Visit (HOSPITAL_COMMUNITY)
Admit: 2021-05-09 | Discharge: 2021-05-09 | Disposition: A | Discharge status: Home / Self Care | Payer: BC Managed Care – PPO | Source: Ambulatory Visit | Attending: Adult Health | Admitting: Adult Health

## 2021-05-09 VITALS — BP 116/78 | HR 107

## 2021-05-09 DIAGNOSIS — M109 Gout, unspecified: Secondary | ICD-10-CM | POA: Insufficient documentation

## 2021-05-09 DIAGNOSIS — Z7901 Long term (current) use of anticoagulants: Secondary | ICD-10-CM | POA: Diagnosis not present

## 2021-05-09 DIAGNOSIS — J449 Chronic obstructive pulmonary disease, unspecified: Secondary | ICD-10-CM | POA: Insufficient documentation

## 2021-05-09 DIAGNOSIS — I82611 Acute embolism and thrombosis of superficial veins of right upper extremity: Secondary | ICD-10-CM | POA: Insufficient documentation

## 2021-05-09 DIAGNOSIS — E109 Type 1 diabetes mellitus without complications: Secondary | ICD-10-CM | POA: Insufficient documentation

## 2021-05-09 DIAGNOSIS — I471 Supraventricular tachycardia: Secondary | ICD-10-CM

## 2021-05-09 DIAGNOSIS — K219 Gastro-esophageal reflux disease without esophagitis: Secondary | ICD-10-CM | POA: Insufficient documentation

## 2021-05-09 DIAGNOSIS — I5020 Unspecified systolic (congestive) heart failure: Secondary | ICD-10-CM | POA: Diagnosis not present

## 2021-05-09 DIAGNOSIS — Z794 Long term (current) use of insulin: Secondary | ICD-10-CM | POA: Insufficient documentation

## 2021-05-09 DIAGNOSIS — Z79899 Other long term (current) drug therapy: Secondary | ICD-10-CM | POA: Insufficient documentation

## 2021-05-09 DIAGNOSIS — I428 Other cardiomyopathies: Secondary | ICD-10-CM | POA: Diagnosis not present

## 2021-05-09 DIAGNOSIS — M869 Osteomyelitis, unspecified: Secondary | ICD-10-CM | POA: Diagnosis not present

## 2021-05-09 DIAGNOSIS — Z7951 Long term (current) use of inhaled steroids: Secondary | ICD-10-CM | POA: Diagnosis not present

## 2021-05-09 DIAGNOSIS — R0602 Shortness of breath: Secondary | ICD-10-CM | POA: Insufficient documentation

## 2021-05-09 DIAGNOSIS — I110 Hypertensive heart disease with heart failure: Secondary | ICD-10-CM | POA: Diagnosis not present

## 2021-05-09 LAB — BASIC METABOLIC PANEL
Anion gap: 10 (ref 5–15)
BUN: 35 mg/dL — ABNORMAL HIGH (ref 8–23)
CO2: 22 mmol/L (ref 22–32)
Calcium: 8.7 mg/dL — ABNORMAL LOW (ref 8.9–10.3)
Chloride: 100 mmol/L (ref 98–111)
Creatinine, Ser: 1.38 mg/dL — ABNORMAL HIGH (ref 0.61–1.24)
GFR, Estimated: 56 mL/min — ABNORMAL LOW (ref >60)
Glucose, Bld: 81 mg/dL (ref 70–99)
Potassium: 5.3 mmol/L — ABNORMAL HIGH (ref 3.5–5.1)
Sodium: 132 mmol/L — ABNORMAL LOW (ref 135–145)

## 2021-05-09 MED ORDER — POTASSIUM CHLORIDE CRYS ER 20 MEQ PO TBCR: 40.0000 meq | EXTENDED_RELEASE_TABLET | Freq: Every day | ORAL | 3 refills | Status: DC

## 2021-05-09 MED ORDER — FUROSEMIDE 40 MG PO TABS: 40.0000 mg | ORAL_TABLET | Freq: Every day | ORAL | 3 refills | Status: DC

## 2021-05-09 NOTE — Progress Notes (Signed)
ReDS Vest / Clip - 05/09/21 1000       ReDS Vest / Clip   Station Marker D    Ruler Value 32    ReDS Value Range Moderate volume overload    ReDS Actual Value 38

## 2021-05-09 NOTE — Progress Notes (Signed)
Heart and Vascular Care Navigation  05/09/2021  ZERIC BARANOWSKI [birth year] [id number]  Reason for Referral: Patient seen in HF TOC.   Engaged with patient face to face for initial visit for Heart and Vascular Care Coordination.                                                                                                   Assessment: Patient is a 65 yo male who was working and living alone. Patient received an eviction notice prior to hospitalization due to questionable hoarding and inability to manage the home due to his current health. Patient reports he began to decline and was then hospitalized. He had a  a lower L amputation and is currently wheelchair bound. He is residing at [facility] for rehab and states his progress is "slow". Patient reports his family are gathering his personal belongings from his place and holding them until he is discharged from the SNF. Patient reports he receives short term disability from his employer and was told that the MD office is exploring his SSA benefits as he will not be able to return to work.  Patient has not heard anything about his benefits and unsure who is assisting with SSA.                               HRT/VAS Care Coordination     Living arrangements for the past 2 months Apartment   Lives with: Self   Home Assistive Devices/Equipment None   DME Agency NA   [place] Agency NA       Social History:                                                                             SDOH Screenings   Alcohol Screen: Low Risk    Last Alcohol Screening Score (AUDIT): 0  Depression (PHQ2-9): Not on file  Financial Resource Strain: Medium Risk   Difficulty of Paying Living Expenses: Somewhat hard  Food Insecurity: Food Insecurity Present   Worried About Charity fundraiser in the Last Year: Sometimes true   Arboriculturist in the Last Year: Never true  Housing: High Risk   Last Housing Risk Score: 2  Physical Activity: Not on file  Social  Connections: Not on file  Stress: Not on file  Tobacco Use: Low Risk    Smoking Tobacco Use: Never   Smokeless Tobacco Use: Never  Transportation Needs: No Transportation Needs   Lack of Transportation (Medical): No   Lack of Transportation (Non-Medical): No    SDOH Interventions: Financial Resources:    Social Security for Disability application assistance Patient unsure of who is assisting with application.  Food Insecurity:   Resides in a SNF  at this time  Housing Insecurity:  Housing Interventions: Other (Comment) (Patient currently in a SNF)  Transportation:   Transportation Interventions: Intervention Not Indicated    Follow-up plan: Patient will explore with the social worker at the facility regarding the application for SSA benefits and ask SNF CSW to contact this CSW to assist with collaboration on benefits. Patient verbalizes understanding and provided CSW card to pass on to the facility. Raquel Sarna, [place], [place]

## 2021-05-09 NOTE — Progress Notes (Signed)
HEART & VASCULAR TRANSITION OF CARE CONSULT NOTE     Referring Physician: Dr Posey Pronto Primary Care: [facility] Primary Cardiologist: Dr Julieanne Manson HF MD: Dr Aundra Dubin  HPI: Referred to clinic by Dr Posey Pronto for heart failure consultation.   Stephen Whitaker is a 65 year old with history of COPD, HFrEF, GERD, gout, DMI, HTN, malnutrition, charcot foot with recent left BKA, and SVT.   He has been followed closely by Dr Julieanne Manson at [facility]. In February he presented for follow up and had been having runs of SVT . Cardiac monitor showed 23 runs of SVT. Placed on BB. He returned for follow up in April and ECHO showed reduced EF < 20% . He was set up for cath. Cath showed normal cors, elevated filling pressures, CO 5.2/CI 2.3. He returned for follow up in May and was volume overloaded so lasix was increased to 80 mg twice a day and farxiga was added. He was seen again in July and there some concern for medication compliance.   Admitted 04/16/21 with infected left foot and progressive osteomyelitis.  Ortho consulted and he underwent R BKA. Placed IV antibiotics. Hospital course complicated by volume overload and SVT. Diuresed with IV lasix and transitioned to 20 mg po lasix. Discharged to [facility] with foley catheter.  Today he returns for HF follow up. Says he feels like fluid is building up. Complaining of fatigue. SOB with exertion. Denies PND/Orthopnea. Appetite ok. No fever or chills. Unable to stand weight due to amputation.  Says he has not been out of his wheelchair. Taking all medications. At [facility].  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 He was recently evicted. He had been renting a room and recently evicted   Cardiac Testing  11/2020 Echo 20-25% RV mildly reduced. R and L atria severely dilated.  2016 Echo Ef 55-60%   LHC/RHC  12/12/20  Normal coronaries RA 13 PA 48/23, mean 33 PCWP 25 LVEDP-23 CO 5.2 L/min CI 2.3  L/min/m Review of Systems: [y] = yes, [ ]  = no   General: Weight gain [ ] ; Weight loss [ ] ; Anorexia [ ] ; Fatigue [ Y]; Fever [ ] ; Chills [ ] ; Weakness [Y ]  Cardiac: Chest pain/pressure [ ] ; Resting SOB [ ] ; Exertional SOB [ Y]; Orthopnea [ Y]; Pedal Edema [ Y]; Palpitations [ ] ; Syncope [ ] ; Presyncope [ ] ; Paroxysmal nocturnal dyspnea[ ]   Pulmonary: Cough [ ] ; Wheezing[ ] ; Hemoptysis[ ] ; Sputum [ ] ; Snoring [ ]   GI: Vomiting[ ] ; Dysphagia[ ] ; Melena[ ] ; Hematochezia [ ] ; Heartburn[ ] ; Abdominal pain [ ] ; Constipation [ ] ; Diarrhea [ ] ; BRBPR [ ]   GU: Hematuria[ ] ; Dysuria [ ] ; Nocturia[ ]   Vascular: Pain in legs with walking [ ] ; Pain in feet with lying flat [ ] ; Non-healing sores [ ] ; Stroke [ ] ; TIA [ ] ; Slurred speech [ ] ;  Neuro: Headaches[ ] ; Vertigo[ ] ; Seizures[ ] ; Paresthesias[ ] ;Blurred vision [ ] ; Diplopia [ ] ; Vision changes [ ]   Ortho/Skin: Arthritis [ ] ; Joint pain [Y ]; Muscle pain [ ] ; Joint swelling [ ] ; Back Pain [Y  ]; Rash [ ]   Psych: Depression[ ] ; Anxiety[ ]   Heme: Bleeding problems [ ] ; Clotting disorders [ ] ; Anemia [ ]   Endocrine: Diabetes [ Y]; Thyroid dysfunction[ ]    Past Medical History:  Diagnosis Date   Abnormal liver function tests    Arthritis    Boil of buttock  COPD (chronic obstructive pulmonary disease) (HCC)    Chronic Bronchitis.Advair inhaler    Dyspnea    GERD (gastroesophageal reflux disease)    takes Protonix daily   Gout    takes Allopurinol daily   Hemorrhoids    Hypertension    takes HCTZ daily   Hypomagnesemia    Leg ulcer (HCC)    Mild mitral regurgitation    Pancreatitis    10 years ago   Pneumonia 2016   Tricuspid regurgitation    Tubular adenoma of colon     Current Outpatient Medications  Medication Sig Dispense Refill   acetaminophen (TYLENOL) 325 MG tablet Take 2 tablets (650 mg total) by mouth every 6 (six) hours as needed for mild pain (or Fever >/= 101).     albuterol (VENTOLIN HFA) 108 (90 Base) MCG/ACT inhaler  Inhale 2 puffs into the lungs every 4 (four) hours as needed for wheezing or shortness of breath. 54 g 1   ascorbic acid (VITAMIN C) 1000 MG tablet Take 1 tablet (1,000 mg total) by mouth daily. 30 tablet 0   azelastine (ASTELIN) 0.1 % nasal spray Place 1-2 sprays into both nostrils 2 (two) times daily as needed (nasal drainage). Use in each nostril as directed 30 mL 5   budesonide-formoterol (SYMBICORT) 160-4.5 MCG/ACT inhaler USE 2 INHALATIONS TWICE A DAY 30.6 g 3   EPINEPHrine 0.3 mg/0.3 mL IJ SOAJ injection Inject 0.3 mg into the muscle as needed for anaphylaxis. 1 each 1   fluticasone (FLONASE) 50 MCG/ACT nasal spray Place 1 spray into both nostrils 2 (two) times daily as needed (nasal congestion). 16 g 5   HYDROcodone-acetaminophen (NORCO/VICODIN) 5-325 MG tablet Take 1-2 tablets by mouth every 6 (six) hours as needed for moderate pain or severe pain. 20 tablet 0   lactase (LACTAID) 3000 units tablet Take 3 tablets (9,000 Units total) by mouth 3 (three) times daily with meals. 180 tablet 0   loperamide (IMODIUM) 2 MG capsule Take 1 capsule (2 mg total) by mouth as needed for diarrhea or loose stools. 30 capsule 0   metoprolol succinate (TOPROL XL) 25 MG 24 hr tablet Take 1 tablet (25 mg total) by mouth daily. 90 tablet 3   MITIGARE 0.6 MG CAPS Take 1 capsule by mouth as needed.     mometasone-formoterol (DULERA) 200-5 MCG/ACT AERO Inhale 2 puffs into the lungs daily as needed for wheezing.     montelukast (SINGULAIR) 10 MG tablet Take 1 tablet (10 mg total) by mouth at bedtime. 90 tablet 2   Multiple Vitamin (MULTIVITAMIN WITH MINERALS) TABS tablet Take 1 tablet by mouth daily. 150 tablet 0   Nutritional Supplements (,FEEDING SUPPLEMENT, PROSOURCE PLUS) liquid Take 30 mLs by mouth 3 (three) times daily between meals. 900 mL 0   pantoprazole (PROTONIX) 40 MG tablet TAKE ONE TABLET BY MOUTH 30 TO 60 MINUTES BEFORE YOUR FIRST AND LAST MEALS OF THE DAY (Patient taking differently: Take 40 mg by  mouth 2 (two) times daily.) 180 tablet 3   polyethylene glycol (MIRALAX / GLYCOLAX) 17 g packet Take 17 g by mouth daily as needed for mild constipation. 14 each 0   Probiotic Product (SUPER PROBIOTIC DIGESTIVE) CAPS Take 1 tablet by mouth as needed (stomach upset). Take as needed with meals     saccharomyces boulardii (FLORASTOR) 250 MG capsule Take 1 capsule (250 mg total) by mouth 2 (two) times daily. 30 capsule 0   sacubitril-valsartan (ENTRESTO) 24-26 MG Take 1 tablet by mouth 2 (two)  times daily. 60 tablet 2   SPIRULINA PO Take 1 capsule by mouth 2 (two) times daily with a meal.     tamsulosin (FLOMAX) 0.4 MG CAPS capsule Take 1 capsule (0.4 mg total) by mouth daily after supper. 30 capsule 0   furosemide (LASIX) 20 MG tablet Take 1 tablet (20 mg total) by mouth daily. 30 tablet 11   Olopatadine HCl 0.2 % SOLN Apply 1 drop to eye daily as needed (itchy/watery eyes). (Patient not taking: No sig reported) 2.5 mL 5   Trolamine Salicylate (BLUE-EMU HEMP EX) Apply 1 application topically daily as needed.     zinc sulfate 220 (50 Zn) MG capsule Take 1 capsule (220 mg total) by mouth daily. 30 capsule 0   No current facility-administered medications for this encounter.    Allergies  Allergen Reactions   Ciprofloxacin Rash and Other (See Comments)    SYNCOPE    Lactose Intolerance (Gi) Diarrhea   Shellfish Allergy Anaphylaxis   Sulfa Antibiotics Other (See Comments)    SYNCOPE DIZZINESS       Social History   Socioeconomic History   Marital status: Single    Spouse name: Not on file   Number of children: 1   Years of education:     Highest education level: Some college, no degree  Occupational History   Not on file  Tobacco Use   Smoking status: Never   Smokeless tobacco: Never  Vaping Use   Vaping Use: Never used  Substance and Sexual Activity   Alcohol use: Yes    Alcohol/week: 0.0 standard drinks    Comment: rarely   Drug use: No   Sexual activity: Not Currently   Other Topics Concern   Not on file  Social History Narrative   Not on file   Social Determinants of Health   Financial Resource Strain: High Risk   Difficulty of Paying Living Expenses: Hard  Food Insecurity: Food Insecurity Present   Worried About Running Out of Food in the Last Year: Sometimes true   Ran Out of Food in the Last Year: Never true  Transportation Needs: No Transportation Needs   Lack of Transportation (Medical): No   Lack of Transportation (Non-Medical): No  Physical Activity: Not on file  Stress: Not on file  Social Connections: Not on file  Intimate Partner Violence: Not on file      Family History  Adopted: Yes  Problem Relation Age of Onset   Cancer Sister 7       unknown origin     Vitals:   05/09/21 1049  BP: 116/78  Pulse: (!) 107  SpO2: 97%    ReDS Vest / Clip - 05/09/21 1000       ReDS Vest / Clip   Station Marker D    Ruler Value 32    ReDS Value Range Moderate volume overload    ReDS Actual Value 38            PHYSICAL EXAM: General:Arrived in a wheelchair. . No respiratory difficulty HEENT: normal Neck: supple. JVP 11-12 . Carotids 2+ bilat; no bruits. No lymphadenopathy or thryomegaly appreciated. Cor: PMI nondisplaced. Regular rate & rhythm. No rubs, gallops or murmurs. Lungs: clear Abdomen: soft, nontender, nondistended. No hepatosplenomegaly. No bruits or masses. Good bowel sounds. Extremities: no cyanosis, clubbing, rash, edema. RUE 2+ edema. RLE 2+ edema. L BKA dressing.  Neuro: alert & oriented x 3, cranial nerves grossly intact. moves all 4 extremities w/o difficulty. Affect pleasant. GU:  Foley     ASSESSMENT & PLAN: Chronic HFrEF, NICM  -Had LHC with normal cors back in April 2022. Unclear etiology? Tachy mediated with H/O SVT.  Echo 11/2020 EF 20-25%.  NYHA III. Volume overloaded. Reds Clip 38% He has not been on diuretics at SNF. Start  lasix 80 mg daily x3 days then 40 mg daily. Will also add Kdur 40 meq daiy   - Continue Toprol XL 25 mg daily  -Continue entresto 24-26 mg twice a day  - Next visit consider adding spiro versus jardiance at next visit.  -Plan to repeat ECHO in 3 months after HF meds optimized - Check BMET   2. COPD  Followed by Dr Melvyn Novas   3. Superficial Vein Thrombosis R basilic vein 8/44/17 Has [place] RUE . No DVT  Will need to elevated.   4. H/O SVT Continue current dose bb.  5. H/O L Foot Osteo with BKA Working with rehab at SNF    Referred to [place] - Yes will work with SNF SW if he discharged to home. Recently evicted.    Refer to Advanced Heart Failure Clinic: Yes  Share with Dr Owens Shark   Follow up 1-2 weeks to reassess volume status.  Greater than 50% of the (total minutes 45) visit spent in counseling/coordination of care regarding the above.   Thomson Herbers NP-C  10:07 PM

## 2021-05-09 NOTE — Patient Instructions (Addendum)
Following orders sent to [facility]:  Take Furosemide 80 mg Daily for 3 days, then take 40 mg Daily Start Potassium 40 meq (2 tabs) Daily Labs done today Labs needed in 1 week, to be drawn at [facility] and faxed to [place] Low salt diet Follow up in 2-3 weeks (05/31/21 at 12 pm) Follow up in 3 months with an echocardiogram (08/07/21 at 11 am)

## 2021-05-11 ENCOUNTER — Telehealth (HOSPITAL_COMMUNITY): Payer: Self-pay | Admitting: Surgery

## 2021-05-11 DIAGNOSIS — B370 Candidal stomatitis: Secondary | ICD-10-CM | POA: Diagnosis not present

## 2021-05-11 DIAGNOSIS — I5020 Unspecified systolic (congestive) heart failure: Secondary | ICD-10-CM | POA: Diagnosis not present

## 2021-05-11 DIAGNOSIS — Z89512 Acquired absence of left leg below knee: Secondary | ICD-10-CM | POA: Diagnosis not present

## 2021-05-11 NOTE — Telephone Encounter (Signed)
I received a call from [place] in reference to patient.  [facility] nurse says that they were unaware of orders to carry out from clinic appt on Sept. 20, 2022--even after HF Clinic nurse called and faxed orders.  I clarified orders and also reviewed additional orders placed after blood work resulted. [facility] nurse also tells me that he has continued to receive 80 mg Potassium daily even after Potassium was to be discontinued with high level.  Reviewed with Darrick Grinder NP and per her patient to discontinue Potassium immediately as previously ordered.  I also requested that BMET be drawn and results faxed to HF Clinic.

## 2021-05-15 DIAGNOSIS — R52 Pain, unspecified: Secondary | ICD-10-CM | POA: Diagnosis not present

## 2021-05-15 DIAGNOSIS — Z89512 Acquired absence of left leg below knee: Secondary | ICD-10-CM | POA: Diagnosis not present

## 2021-05-15 DIAGNOSIS — Z5181 Encounter for therapeutic drug level monitoring: Secondary | ICD-10-CM | POA: Diagnosis not present

## 2021-05-18 ENCOUNTER — Telehealth (HOSPITAL_COMMUNITY): Payer: Self-pay | Admitting: Adult Health

## 2021-05-18 DIAGNOSIS — R7989 Other specified abnormal findings of blood chemistry: Secondary | ICD-10-CM | POA: Diagnosis not present

## 2021-05-18 DIAGNOSIS — D649 Anemia, unspecified: Secondary | ICD-10-CM | POA: Diagnosis not present

## 2021-05-18 DIAGNOSIS — E119 Type 2 diabetes mellitus without complications: Secondary | ICD-10-CM | POA: Diagnosis not present

## 2021-05-18 DIAGNOSIS — E875 Hyperkalemia: Secondary | ICD-10-CM | POA: Diagnosis not present

## 2021-05-18 NOTE — Telephone Encounter (Signed)
   Received labs results form [place] at [employer] personally called and discussed with facility NP  Lavone Neri and they are going to manage abnormal lab work and will repeat BMET next week.   K 5.8 Creatinine 1.57  I discontinued K supplment on MAR.   Wen Munford NP-C  10:59 AM

## 2021-05-19 ENCOUNTER — Ambulatory Visit (INDEPENDENT_AMBULATORY_CARE_PROVIDER_SITE_OTHER): Payer: BC Managed Care – PPO | Admitting: Family

## 2021-05-19 ENCOUNTER — Encounter: Payer: Self-pay | Admitting: Family

## 2021-05-19 DIAGNOSIS — S88112A Complete traumatic amputation at level between knee and ankle, left lower leg, initial encounter: Secondary | ICD-10-CM | POA: Insufficient documentation

## 2021-05-19 NOTE — Progress Notes (Signed)
Post-Op Visit Note   Patient: Stephen Whitaker           Date of Birth: Jan 20, 1956           MRN: [id number] Visit Date: 05/19/2021 PCP: [name], NP  Chief Complaint: No chief complaint on file.   HPI:  HPI The patient is a 66 year old gentleman seem status post left below-knee amputation.  He is residing at skilled nursing.  He has no concerns today. Ortho Exam On examination of the left lower extremity the incision appears to be healing well there are staples in place to his left below-knee amputation.  There also is moderate edema his shrinker has been rolled down below the kneecap.  There is no gaping drainage erythema this is healing well  Visit Diagnoses: No diagnosis found.  Plan: Staples harvested today without incident.  He may proceed with prosthesis set up.  Order for prosthesis provided to patient today and they will continue with daily Dial soap cleansing and shrinker with direct skin contact.  Continue the limb protector.  Follow-Up Instructions: No follow-ups on file.   Imaging: No results found.  Orders:  No orders of the defined types were placed in this encounter.  No orders of the defined types were placed in this encounter.    PMFS History: Patient Active Problem List   Diagnosis Date Noted   Seasonal and perennial allergic rhinoconjunctivitis 05/08/2021   Oral thrush 05/08/2021   Pressure injury of skin 04/17/2021   Protein-calorie malnutrition, severe 04/17/2021   Osteomyelitis of left foot ([place]) 04/16/2021   Diabetic foot infection ([place])    Fatigue 02/22/2021   Nonadherence to medication 02/15/2021   Asthma 01/30/2021   Other adverse food reactions, not elsewhere classified, subsequent encounter 01/30/2021   LVH (left ventricular hypertrophy) 01/04/2021   Acute on chronic systolic heart failure ([place]) 12/08/2020   HFrEF (heart failure with reduced ejection fraction) ([place]) 10/14/2020   Nonspecific abnormal electrocardiogram (ECG)  (EKG) 10/14/2020   Atrial tachycardia ([place]) 10/14/2020   Non-pressure chronic ulcer of other part of left foot limited to breakdown of skin ([place]) 10/12/2020   Leg swelling 10/06/2020   DOE (dyspnea on exertion) 10/06/2020   Thrombocytopenia ([place]) 06/26/2019   Acquired absence of other right toe(s) ([place]) 02/19/2018   Boil of buttock 10/24/2016   Amputated toe of left foot ([place]) 10/19/2016   Amputated toe, right ([place]) 06/13/2016   Chronic bronchitis ([place]) 11/24/2015   Other pancytopenia ([place]) 01/24/2015   Chronic pancreatitis ([place]) 01/24/2015   Blood poisoning    Neutropenia ([place])    Iron deficiency anemia due to chronic blood loss    Leukopenia    Sepsis ([place]) 01/21/2015   Leucopenia 01/21/2015   Anemia 01/21/2015   Syncope 01/21/2015   Diarrhea 01/21/2015   Hypokalemia 01/21/2015   Loss of consciousness ([place])    Eczema 10/21/2014   Essential hypertension 10/21/2014   Vasovagal syncope 10/21/2014   Other chronic pancreatitis ([place]) 08/10/2014   COLD (chronic obstructive lung disease) ([place]) 08/10/2014   Chronic ethmoidal sinusitis 08/10/2014   Bronchitis 10/17/2010   PLANTAR FASCIITIS, LEFT 10/17/2010   OTHER NEUTROPENIA 04/21/2010   CALLUS, TOE 03/15/2010   LEUKOPENIA, MILD 02/20/2010   LIVER FUNCTION TESTS, ABNORMAL, HX OF 02/16/2010   DENTAL CARIES 02/15/2010   ONYCHOMYCOSIS 07/05/2009   ALLERGIC URTICARIA 07/05/2009   PAIN IN LIMB 07/05/2009   ERECTILE DYSFUNCTION 05/25/2008   INFLUENZA 05/25/2008   VIRAL INFECTION 10/01/2007   HSV 05/02/2007   CONDYLOMA ACUMINATUM  05/02/2007   HYPERTENSION 05/02/2007   Chronic asthma, mild persistent, uncomplicated 79/10/8331   Gastroesophageal reflux disease without esophagitis 05/02/2007   PANCREATITIS 05/02/2007   Past Medical History:  Diagnosis Date   Abnormal liver function tests    Arthritis    Boil of buttock    COPD (chronic obstructive pulmonary disease) (HCC)    Chronic Bronchitis.Advair inhaler    Dyspnea     GERD (gastroesophageal reflux disease)    takes Protonix daily   Gout    takes Allopurinol daily   Hemorrhoids    Hypertension    takes HCTZ daily   Hypomagnesemia    Leg ulcer (HCC)    Mild mitral regurgitation    Pancreatitis    10 years ago   Pneumonia 2016   Tricuspid regurgitation    Tubular adenoma of colon     Family History  Adopted: Yes  Problem Relation Age of Onset   Cancer Sister 64       unknown origin     Past Surgical History:  Procedure Laterality Date   AMPUTATION Left 07/14/2014   Procedure: 2nd Toe Amputation;  Surgeon: [name], MD;  Location: [facility];  Service: Orthopedics;  Laterality: Left;   AMPUTATION Left 11/16/2015   Procedure: Left Great Toe Amputation at Metatarsophalangeal Joint;  Surgeon: [name], MD;  Location: [facility];  Service: Orthopedics;  Laterality: Left;   AMPUTATION Right 06/13/2016   Procedure: RIGHT 2nd Toe Amputation;  Surgeon: [name], MD;  Location: [facility];  Service: Orthopedics;  Laterality: Right;   AMPUTATION Left 10/19/2016   Procedure: Amputation Left 3rd Toe;  Surgeon: [name], MD;  Location: [facility];  Service: Orthopedics;  Laterality: Left;   AMPUTATION Left 04/21/2021   Procedure: LEFT BELOW KNEE AMPUTATION;  Surgeon: [name], MD;  Location: [facility];  Service: Orthopedics;  Laterality: Left;   RIGHT/LEFT HEART CATH AND CORONARY ANGIOGRAPHY N/A 12/12/2020   Procedure: RIGHT/LEFT HEART CATH AND CORONARY ANGIOGRAPHY;  Surgeon: Lorretta Harp, MD;  Location: [facility];  Service: Cardiovascular;  Laterality: N/A;   TOE AMPUTATION Left 11/16/2015   left great toe    TOE AMPUTATION Right 06/13/2016   TONSILLECTOMY     Social History   Occupational History   Not on file  Tobacco Use   Smoking status: Never   Smokeless tobacco: Never  Vaping Use   Vaping Use: Never used  Substance and Sexual Activity   Alcohol use: Yes    Alcohol/week: 0.0 standard drinks    Comment: rarely   Drug use: No    Sexual activity: Not Currently

## 2021-05-22 DIAGNOSIS — E875 Hyperkalemia: Secondary | ICD-10-CM | POA: Diagnosis not present

## 2021-05-22 DIAGNOSIS — E162 Hypoglycemia, unspecified: Secondary | ICD-10-CM | POA: Diagnosis not present

## 2021-05-25 DIAGNOSIS — E875 Hyperkalemia: Secondary | ICD-10-CM | POA: Diagnosis not present

## 2021-05-25 DIAGNOSIS — N1832 Chronic kidney disease, stage 3b: Secondary | ICD-10-CM | POA: Diagnosis not present

## 2021-05-25 DIAGNOSIS — R748 Abnormal levels of other serum enzymes: Secondary | ICD-10-CM | POA: Diagnosis not present

## 2021-05-29 DIAGNOSIS — N400 Enlarged prostate without lower urinary tract symptoms: Secondary | ICD-10-CM | POA: Diagnosis not present

## 2021-05-29 DIAGNOSIS — Z89512 Acquired absence of left leg below knee: Secondary | ICD-10-CM | POA: Diagnosis not present

## 2021-05-29 DIAGNOSIS — E119 Type 2 diabetes mellitus without complications: Secondary | ICD-10-CM | POA: Diagnosis not present

## 2021-05-29 DIAGNOSIS — J449 Chronic obstructive pulmonary disease, unspecified: Secondary | ICD-10-CM | POA: Diagnosis not present

## 2021-05-30 NOTE — Progress Notes (Addendum)
[facility] Consult Note   PCP: [name], NP PCP-Cardiologist: [name], MD  HF Cardiologist: Dr. Haroldine Laws  HPI: Mr Schmieder is a 65 y.o.with history of COPD, HFrEF, GERD, gout, DMI, HTN, malnutrition, charcot foot with recent left BKA, and SVT.    He has been followed closely by Dr Julieanne Manson at [facility]. In February he presented for follow up and had been having runs of SVT . Cardiac monitor showed 23 runs of SVT. Placed on BB. He returned for follow up in April and ECHO showed reduced EF < 20% . He was set up for cath. Cath showed normal cors, elevated filling pressures, CO 5.2/CI 2.3. He returned for follow up in May and was volume overloaded so lasix was increased to 80 mg bid and Wilder Glade was added. He was seen again in July and there some concern for medication compliance.    Admitted 04/16/21 with infected left foot and progressive osteomyelitis.  Ortho consulted and he underwent R BKA. Placed IV antibiotics. Hospital course complicated by volume overload and SVT. Diuresed with IV lasix and transitioned to 20 mg po lasix. Discharged to [facility] with foley catheter.  Today he presents to establish care with AHF clinic. He has been seen in the [facility]. He feels tired, poor energy.  He gets SOB with PT and transfers, can bear some weight on right leg. Denies CP, dizziness, edema, or PND/Orthopnea. Appetite fair, he has been skipping some meals. No fever or chills. Taking all medications provided by SNF. He has a wound on his bottom.  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 He had been renting a room and recently evicted   Cardiac Testing  - 4/22 Echo 20-25% RV mildly reduced. R and L atria severely dilated.  - 2016 Echo EF 55-60%    - LHC/RHC  12/12/20  Normal coronaries RA 13 PA 48/23, mean 33 PCWP 25 LVEDP-23 CO 5.2 L/min CI 2.3 L/min/m  Review of Systems: [y]  = yes, [ ]  = no   General: Weight gain [ ] ; Weight loss Blue.Reese ]; Anorexia Blue.Reese ]; Fatigue [ y]; Fever [ ] ; Chills [ ] ; Weakness [ y]  Cardiac: Chest pain/pressure [ ] ; Resting SOB [ ] ; Exertional SOB Blue.Reese ]; Orthopnea [ ] ; Pedal Edema [ ] ; Palpitations [ ] ; Syncope [ ] ; Presyncope [ ] ; Paroxysmal nocturnal dyspnea[ ]   Pulmonary: Cough [ ] ; Wheezing[ ] ; Hemoptysis[ ] ; Sputum [ ] ; Snoring [ ]   GI: Vomiting[ ] ; Dysphagia[ ] ; Melena[ ] ; Hematochezia [ ] ; Heartburn[ ] ; Abdominal pain [ ] ; Constipation [ ] ; Diarrhea [ ] ; BRBPR [ ]   GU: Hematuria[ ] ; Dysuria [ ] ; Nocturia[ ]   Vascular: Pain in legs with walking [ ] ; Pain in feet with lying flat [ ] ; Non-healing sores Blue.Reese ]; Stroke [ ] ; TIA [ ] ; Slurred speech [ ] ;  Neuro: Headaches[ ] ; Vertigo[ ] ; Seizures[ ] ; Paresthesias[ ] ;Blurred vision [ ] ; Diplopia [ ] ; Vision changes [ ]   Ortho/Skin: Arthritis [ ] ; Joint pain [ ] ; Muscle pain [ ] ; Joint swelling [ ] ; Back Pain [ ] ; Rash [ ]   Psych: Depression[ ] ; Anxiety[ ]   Heme: Bleeding problems [ ] ; Clotting disorders [ ] ; Anemia [ ]   Endocrine: Diabetes [ ] ; Thyroid dysfunction[ ]   Past Medical History:  Diagnosis Date   Abnormal liver function tests    Arthritis    Boil of buttock    COPD (  chronic obstructive pulmonary disease) (HCC)    Chronic Bronchitis.Advair inhaler    Dyspnea    GERD (gastroesophageal reflux disease)    takes Protonix daily   Gout    takes Allopurinol daily   Hemorrhoids    Hypertension    takes HCTZ daily   Hypomagnesemia    Leg ulcer (HCC)    Mild mitral regurgitation    Pancreatitis    10 years ago   Pneumonia 2016   Tricuspid regurgitation    Tubular adenoma of colon    Current Outpatient Medications  Medication Sig Dispense Refill   acetaminophen (TYLENOL) 325 MG tablet Take 2 tablets (650 mg total) by mouth every 6 (six) hours as needed for mild pain (or Fever >/= 101).     albuterol (VENTOLIN HFA) 108 (90 Base) MCG/ACT inhaler Inhale 2 puffs into the lungs every 4  (four) hours as needed for wheezing or shortness of breath. 54 g 1   ascorbic acid (VITAMIN C) 1000 MG tablet Take 1 tablet (1,000 mg total) by mouth daily. 30 tablet 0   azelastine (ASTELIN) 0.1 % nasal spray Place 1-2 sprays into both nostrils 2 (two) times daily as needed (nasal drainage). Use in each nostril as directed 30 mL 5   budesonide-formoterol (SYMBICORT) 160-4.5 MCG/ACT inhaler USE 2 INHALATIONS TWICE A DAY 30.6 g 3   EPINEPHrine 0.3 mg/0.3 mL IJ SOAJ injection Inject 0.3 mg into the muscle as needed for anaphylaxis. 1 each 1   fluticasone (FLONASE) 50 MCG/ACT nasal spray Place 1 spray into both nostrils 2 (two) times daily as needed (nasal congestion). 16 g 5   furosemide (LASIX) 40 MG tablet Take 1 tablet (40 mg total) by mouth daily. 90 tablet 3   HYDROcodone-acetaminophen (NORCO/VICODIN) 5-325 MG tablet Take 1-2 tablets by mouth every 6 (six) hours as needed for moderate pain or severe pain. 20 tablet 0   lactase (LACTAID) 3000 units tablet Take 3 tablets (9,000 Units total) by mouth 3 (three) times daily with meals. 180 tablet 0   loperamide (IMODIUM) 2 MG capsule Take 1 capsule (2 mg total) by mouth as needed for diarrhea or loose stools. 30 capsule 0   metoprolol succinate (TOPROL XL) 25 MG 24 hr tablet Take 1 tablet (25 mg total) by mouth daily. 90 tablet 3   MITIGARE 0.6 MG CAPS Take 1 capsule by mouth as needed.     mometasone-formoterol (DULERA) 200-5 MCG/ACT AERO Inhale 2 puffs into the lungs daily as needed for wheezing.     montelukast (SINGULAIR) 10 MG tablet Take 1 tablet (10 mg total) by mouth at bedtime. 90 tablet 2   Multiple Vitamin (MULTIVITAMIN WITH MINERALS) TABS tablet Take 1 tablet by mouth daily. 150 tablet 0   Nutritional Supplements (,FEEDING SUPPLEMENT, PROSOURCE PLUS) liquid Take 30 mLs by mouth 3 (three) times daily between meals. 900 mL 0   Olopatadine HCl 0.2 % SOLN Apply 1 drop to eye daily as needed (itchy/watery eyes). (Patient not taking: No sig  reported) 2.5 mL 5   pantoprazole (PROTONIX) 40 MG tablet TAKE ONE TABLET BY MOUTH 30 TO 60 MINUTES BEFORE YOUR FIRST AND LAST MEALS OF THE DAY (Patient taking differently: Take 40 mg by mouth 2 (two) times daily.) 180 tablet 3   polyethylene glycol (MIRALAX / GLYCOLAX) 17 g packet Take 17 g by mouth daily as needed for mild constipation. 14 each 0   Probiotic Product (SUPER PROBIOTIC DIGESTIVE) CAPS Take 1 tablet by mouth as needed (stomach upset).  Take as needed with meals     saccharomyces boulardii (FLORASTOR) 250 MG capsule Take 1 capsule (250 mg total) by mouth 2 (two) times daily. 30 capsule 0   sacubitril-valsartan (ENTRESTO) 24-26 MG Take 1 tablet by mouth 2 (two) times daily. 60 tablet 2   SPIRULINA PO Take 1 capsule by mouth 2 (two) times daily with a meal.     tamsulosin (FLOMAX) 0.4 MG CAPS capsule Take 1 capsule (0.4 mg total) by mouth daily after supper. 30 capsule 0   Trolamine Salicylate (BLUE-EMU HEMP EX) Apply 1 application topically daily as needed. (Patient not taking: Reported on 05/09/2021)     zinc sulfate 220 (50 Zn) MG capsule Take 1 capsule (220 mg total) by mouth daily. 30 capsule 0   No current facility-administered medications for this encounter.   Allergies  Allergen Reactions   Ciprofloxacin Rash and Other (See Comments)    SYNCOPE    Lactose Intolerance (Gi) Diarrhea   Shellfish Allergy Anaphylaxis   Sulfa Antibiotics Other (See Comments)    SYNCOPE DIZZINESS    Social History   Socioeconomic History   Marital status: Single    Spouse name: Not on file   Number of children: 1   Years of education:     Highest education level: Some college, no degree  Occupational History   Not on file  Tobacco Use   Smoking status: Never   Smokeless tobacco: Never  Vaping Use   Vaping Use: Never used  Substance and Sexual Activity   Alcohol use: Yes    Alcohol/week: 0.0 standard drinks    Comment: rarely   Drug use: No   Sexual activity: Not Currently  Other  Topics Concern   Not on file  Social History Narrative   Not on file   Social Determinants of Health   Financial Resource Strain: Medium Risk   Difficulty of Paying Living Expenses: Somewhat hard  Food Insecurity: Food Insecurity Present   Worried About Running Out of Food in the Last Year: Sometimes true   Ran Out of Food in the Last Year: Never true  Transportation Needs: No Transportation Needs   Lack of Transportation (Medical): No   Lack of Transportation (Non-Medical): No  Physical Activity: Not on file  Stress: Not on file  Social Connections: Not on file  Intimate Partner Violence: Not on file   Family History  Adopted: Yes  Problem Relation Age of Onset   Cancer Sister 56       unknown origin    Vitals:   05/31/21 1210  BP: 118/88   Wt Readings from Last 3 Encounters:  04/16/21 90.7 kg (200 lb)  02/22/21 91.2 kg (201 lb)  02/14/21 93 kg (205 lb)   PHYSICAL EXAM: General:  NAD. No resp difficulty, arrived in [facility], chronically-ill appearing HEENT: Normal Neck: Supple. JVP 7-8 Carotids 2+ bilat; no bruits. No lymphadenopathy or thryomegaly appreciated. Cor: PMI nondisplaced. Regular rate & rhythm. No rubs, gallops or murmurs. Lungs: Clear Abdomen: Soft, nontender, nondistended. No hepatosplenomegaly. No bruits or masses. Good bowel sounds. Extremities: No cyanosis, clubbing, rash, RLE 1-2+ edema, L BKA Neuro: Alert & oriented x 3, cranial nerves grossly intact. Moves all 4 extremities w/o difficulty. Affect pleasant.  ECG: Sinus tachycardia 103 bpm (personally reviewed). ReDs: 30%  ASSESSMENT & PLAN: Chronic HFrEF, NICM  - Had LHC with normal cors back in April 2022. Unclear etiology? Tachy mediated with H/O SVT.  - Echo 11/2020 EF 20-25%.  - NYHA III,  functional status difficult due to physical deconditioning and left BKA but he gets SOB with transfers.  - Appears mildly volume overloaded today on exam however, Reds Clip 30%. Last weight 199.5 lbs on  05/02/21. - Increase lasix 60 mg daily x 2 days then back to 40 mg daily. - Will hold off starting spiro today with most recent K 5.8. - Continue Toprol XL 25 mg daily.  - Continue Entresto 24-26 mg bid. - No SGLT2i with foley catheter for now.  - Plan to repeat ECHO in 3 months after HF meds optimized - BMET today, repeat in 10 days.   2. COPD  - Followed by Dr Melvyn Novas. - No wheezes on exam today.    3. Superficial Vein Thrombosis R basilic vein - 3/75/43 [place] RUE. No DVT  - Will need to evaluated.    4. H/o SVT - Continue current dose bb.   5. H/o L Foot Osteo with BKA - Working with rehab at SNF  6. LUTS/BPH - He was discharged from hospital with foley, previously retaining 1000 cc. - Refer to Urology. - Continue Flomax   Follow up in 3 weeks with APP   Rafael Bihari, FNP 05/31/21

## 2021-05-31 ENCOUNTER — Encounter (HOSPITAL_COMMUNITY): Payer: Self-pay

## 2021-05-31 ENCOUNTER — Ambulatory Visit (HOSPITAL_COMMUNITY)
Admission: RE | Admit: 2021-05-31 | Discharge: 2021-05-31 | Disposition: A | Discharge status: Home / Self Care | Payer: BC Managed Care – PPO | Source: Ambulatory Visit | Attending: Family Medicine | Admitting: Family Medicine

## 2021-05-31 ENCOUNTER — Other Ambulatory Visit: Payer: Self-pay

## 2021-05-31 VITALS — BP 118/88

## 2021-05-31 DIAGNOSIS — I471 Supraventricular tachycardia: Secondary | ICD-10-CM | POA: Diagnosis not present

## 2021-05-31 DIAGNOSIS — I5020 Unspecified systolic (congestive) heart failure: Secondary | ICD-10-CM

## 2021-05-31 DIAGNOSIS — I428 Other cardiomyopathies: Secondary | ICD-10-CM | POA: Insufficient documentation

## 2021-05-31 DIAGNOSIS — K219 Gastro-esophageal reflux disease without esophagitis: Secondary | ICD-10-CM | POA: Insufficient documentation

## 2021-05-31 DIAGNOSIS — I82611 Acute embolism and thrombosis of superficial veins of right upper extremity: Secondary | ICD-10-CM | POA: Insufficient documentation

## 2021-05-31 DIAGNOSIS — E109 Type 1 diabetes mellitus without complications: Secondary | ICD-10-CM | POA: Insufficient documentation

## 2021-05-31 DIAGNOSIS — Z79899 Other long term (current) drug therapy: Secondary | ICD-10-CM | POA: Insufficient documentation

## 2021-05-31 DIAGNOSIS — N401 Enlarged prostate with lower urinary tract symptoms: Secondary | ICD-10-CM

## 2021-05-31 DIAGNOSIS — Z5941 Food insecurity: Secondary | ICD-10-CM | POA: Insufficient documentation

## 2021-05-31 DIAGNOSIS — Z596 Low income: Secondary | ICD-10-CM | POA: Insufficient documentation

## 2021-05-31 DIAGNOSIS — J449 Chronic obstructive pulmonary disease, unspecified: Secondary | ICD-10-CM | POA: Insufficient documentation

## 2021-05-31 DIAGNOSIS — Z881 Allergy status to other antibiotic agents status: Secondary | ICD-10-CM | POA: Insufficient documentation

## 2021-05-31 DIAGNOSIS — N400 Enlarged prostate without lower urinary tract symptoms: Secondary | ICD-10-CM | POA: Insufficient documentation

## 2021-05-31 DIAGNOSIS — Z882 Allergy status to sulfonamides status: Secondary | ICD-10-CM | POA: Diagnosis not present

## 2021-05-31 DIAGNOSIS — Z89512 Acquired absence of left leg below knee: Secondary | ICD-10-CM | POA: Diagnosis not present

## 2021-05-31 DIAGNOSIS — M109 Gout, unspecified: Secondary | ICD-10-CM | POA: Diagnosis not present

## 2021-05-31 DIAGNOSIS — Z960 Presence of urogenital implants: Secondary | ICD-10-CM | POA: Diagnosis not present

## 2021-05-31 DIAGNOSIS — Z7951 Long term (current) use of inhaled steroids: Secondary | ICD-10-CM | POA: Insufficient documentation

## 2021-05-31 DIAGNOSIS — I110 Hypertensive heart disease with heart failure: Secondary | ICD-10-CM | POA: Diagnosis not present

## 2021-05-31 DIAGNOSIS — I5022 Chronic systolic (congestive) heart failure: Secondary | ICD-10-CM | POA: Diagnosis not present

## 2021-05-31 DIAGNOSIS — E46 Unspecified protein-calorie malnutrition: Secondary | ICD-10-CM | POA: Diagnosis not present

## 2021-05-31 DIAGNOSIS — I82890 Acute embolism and thrombosis of other specified veins: Secondary | ICD-10-CM

## 2021-05-31 DIAGNOSIS — Z8739 Personal history of other diseases of the musculoskeletal system and connective tissue: Secondary | ICD-10-CM

## 2021-05-31 LAB — BASIC METABOLIC PANEL
Anion gap: 12 (ref 5–15)
BUN: 29 mg/dL — ABNORMAL HIGH (ref 8–23)
CO2: 22 mmol/L (ref 22–32)
Calcium: 8.6 mg/dL — ABNORMAL LOW (ref 8.9–10.3)
Chloride: 101 mmol/L (ref 98–111)
Creatinine, Ser: 1.16 mg/dL (ref 0.61–1.24)
GFR, Estimated: >60 mL/min (ref >60)
Glucose, Bld: 95 mg/dL (ref 70–99)
Potassium: 4.4 mmol/L (ref 3.5–5.1)
Sodium: 135 mmol/L (ref 135–145)

## 2021-05-31 NOTE — Patient Instructions (Signed)
Patient needs to take his medication lasix 60 mg daily for 2 days then back to 40 mg daily.  Labs today We will only contact you if something comes back abnormal or we need to make some changes. Otherwise no news is good news!  You have been referred to Urology They will be in contact for an appointment.    Your physician recommends that you schedule a follow-up appointment in: 3 weeks.  At the [facility], you and your health needs are our priority. As part of our continuing mission to provide you with exceptional heart care, we have created designated Provider Care Teams. These Care Teams include your primary Cardiologist (physician) and Advanced Practice Providers (APPs- Physician Assistants and Nurse Practitioners) who all work together to provide you with the care you need, when you need it.   You may see any of the following providers on your designated Care Team at your next follow up: Dr Glori Bickers Dr Loralie Champagne Dr Patrice Paradise, NP Lyda Jester, [place] Ginnie Smart Audry Riles, PharmD   Please be sure to bring in all your medications bottles to every appointment.

## 2021-05-31 NOTE — Addendum Note (Signed)
Encounter addended by: Rafael Bihari, FNP on: 05/31/2021 8:27 PM  Actions taken: Clinical Note Signed

## 2021-05-31 NOTE — Progress Notes (Signed)
Heart and Vascular Care Navigation  05/31/2021  ZAHKI HOOGENDOORN 10/13/1955 [id number]  Reason for Referral: concerns with housing/ questions about disability/retirement income   Engaged with patient face to face for follow up visit for Heart and Vascular Care Coordination.                                                                                                   Assessment:      Pt was evicted from home while he was hospitalized and was then transferred to SNF for rehab. Is unsure where he will go after SNF but is interested in another care facility as he doesn't think he will be able to live independently at this time.  Does not have LTC payor source at this time so CSW discussed with pt current sources of income.  Was working prior to hospital stay and is not drawing short term disability- not sure how much he gets.  Thinks he can utilize this benefit for 6 months and then can get LTD benefits but unsure thought someone was helping him with this.  Informed pt he needs to call his employer to inquire about his benefits and if he will get LTC.  CSW then inquired if he had considered getting retirement benefits as he is 65 years old.  Pt thought someone was helping him with this.  Explained that SSA would need to speak with him directly so he would have to be the one to call and ask to start drawing retirement benefits.  Encouraged pt to start talking to social worker at SNF about plan for where to go at DC as they would have further knowledge of helping pt evaluate if he is appropriate for LTC Medicaid and would be able to assist in finding a LTC facility if that is the route he decides to go- pt expressed understanding.   HRT/VAS Care Coordination     Living arrangements for the past 2 months Apartment   Lives with: Self   Home Assistive Devices/Equipment None   DME Agency NA   [place] NA       Social History:                                                                              SDOH Screenings   Alcohol Screen: Low Risk    Last Alcohol Screening Score (AUDIT): 0  Depression (PHQ2-9): Not on file  Financial Resource Strain: Medium Risk   Difficulty of Paying Living Expenses: Somewhat hard  Food Insecurity: Food Insecurity Present   Worried About Running Out of Food in the Last Year: Sometimes true   Ran Out of Food in the Last Year: Never true  Housing: High Risk   Last Housing Risk Score: 2  Physical Activity: Not on file  Social Connections: Not on file  Stress: Not on file  Tobacco Use: Low Risk    Smoking Tobacco Use: Never   Smokeless Tobacco Use: Never  Transportation Needs: No Transportation Needs   Lack of Transportation (Medical): No   Lack of Transportation (Non-Medical): No    SDOH Interventions: Financial Resources:    SSA to inquire about starting retirement benefits  Food Insecurity:   N/a at SNF provided with all meals  Housing Insecurity:   Referred to SNF social worker to discuss getting into long term care/ applying for LTC Medicaid  Transportation:    N/a   Follow-up plan:    Pt to follow up with SSA bout starting retirement benefits and with SNF social worker regarding plan for time of DC and possibly helping with finding LTC placement.  Jorge Ny, LCSW Clinical Social Worker [facility] Desk#: [phone] Cell#: [phone]

## 2021-05-31 NOTE — Progress Notes (Signed)
   ReDS Vest / Clip - 05/31/21 1200       ReDS Vest / Clip   Station Marker C    Ruler Value 30    ReDS Value Range Low volume    ReDS Actual Value 30

## 2021-06-01 ENCOUNTER — Other Ambulatory Visit (HOSPITAL_COMMUNITY): Payer: Self-pay | Admitting: Cardiology

## 2021-06-05 ENCOUNTER — Encounter: Payer: Self-pay | Admitting: Urology

## 2021-06-07 ENCOUNTER — Telehealth: Payer: Self-pay | Admitting: Family

## 2021-06-08 DIAGNOSIS — R52 Pain, unspecified: Secondary | ICD-10-CM | POA: Diagnosis not present

## 2021-06-08 DIAGNOSIS — W19XXXA Unspecified fall, initial encounter: Secondary | ICD-10-CM | POA: Diagnosis not present

## 2021-06-08 DIAGNOSIS — R531 Weakness: Secondary | ICD-10-CM | POA: Diagnosis not present

## 2021-06-14 ENCOUNTER — Encounter: Payer: BC Managed Care – PPO | Admitting: Family

## 2021-06-16 ENCOUNTER — Encounter: Payer: BC Managed Care – PPO | Admitting: Family

## 2021-06-19 DIAGNOSIS — N401 Enlarged prostate with lower urinary tract symptoms: Secondary | ICD-10-CM | POA: Diagnosis not present

## 2021-06-19 DIAGNOSIS — R338 Other retention of urine: Secondary | ICD-10-CM | POA: Diagnosis not present

## 2021-06-20 ENCOUNTER — Ambulatory Visit (INDEPENDENT_AMBULATORY_CARE_PROVIDER_SITE_OTHER): Payer: BC Managed Care – PPO | Admitting: Family

## 2021-06-20 DIAGNOSIS — Z89512 Acquired absence of left leg below knee: Secondary | ICD-10-CM

## 2021-06-20 NOTE — Progress Notes (Signed)
Post-Op Visit Note   Patient: Stephen Whitaker           Date of Birth: 07/20/1956           MRN: [id number] Visit Date: 06/20/2021 PCP: [name], NP  Chief Complaint:  Chief Complaint  Patient presents with   Left Leg - Routine Post Op    Left BKA 04/21/21    HPI:  HPI The patient is a 65 year old gentleman seen status post left below-knee amputation September 2 this is healing well.  He states he is working with physical therapy at skilled nursing. Continues in a shrinker and limb protector Ortho Exam On examination of the left residual limb he does have pitting edema in his thigh.  The incision itself is well-healed there is no gaping drainage no erythema no sign of infection. consolidating well  Visit Diagnoses: No diagnosis found.  Plan: Continue shrinker around-the-clock.  May discontinue the limb protector.  Proceed with prosthesis set up and gait training.  We will follow-up with him in 3 months in the office.  Follow-Up Instructions: No follow-ups on file.   Imaging: No results found.  Orders:  No orders of the defined types were placed in this encounter.  No orders of the defined types were placed in this encounter.    PMFS History: Patient Active Problem List   Diagnosis Date Noted   Urinary catheter in place 05/31/2021   Below-knee amputation of left lower extremity ([place]) 05/19/2021   Seasonal and perennial allergic rhinoconjunctivitis 05/08/2021   Oral thrush 05/08/2021   Pressure injury of skin 04/17/2021   Protein-calorie malnutrition, severe 04/17/2021   Osteomyelitis of left foot ([place]) 04/16/2021   Diabetic foot infection ([place])    Fatigue 02/22/2021   Nonadherence to medication 02/15/2021   Asthma 01/30/2021   Other adverse food reactions, not elsewhere classified, subsequent encounter 01/30/2021   LVH (left ventricular hypertrophy) 01/04/2021   Acute on chronic systolic heart failure ([place]) 12/08/2020   HFrEF (heart failure with  reduced ejection fraction) ([place]) 10/14/2020   Nonspecific abnormal electrocardiogram (ECG) (EKG) 10/14/2020   Atrial tachycardia ([place]) 10/14/2020   Non-pressure chronic ulcer of other part of left foot limited to breakdown of skin ([place]) 10/12/2020   Leg swelling 10/06/2020   DOE (dyspnea on exertion) 10/06/2020   Thrombocytopenia ([place]) 06/26/2019   Acquired absence of other right toe(s) ([place]) 02/19/2018   Boil of buttock 10/24/2016   Amputated toe of left foot ([place]) 10/19/2016   Amputated toe, right ([place]) 06/13/2016   Chronic bronchitis ([place]) 11/24/2015   Other pancytopenia ([facility]) 01/24/2015   Chronic pancreatitis ([place]) 01/24/2015   Blood poisoning    Neutropenia ([place])    Iron deficiency anemia due to chronic blood loss    Leukopenia    Sepsis ([place]) 01/21/2015   Leucopenia 01/21/2015   Anemia 01/21/2015   Syncope 01/21/2015   Diarrhea 01/21/2015   Hypokalemia 01/21/2015   Loss of consciousness ([place])    Eczema 10/21/2014   Essential hypertension 10/21/2014   Vasovagal syncope 10/21/2014   Other chronic pancreatitis ([place]) 08/10/2014   COLD (chronic obstructive lung disease) ([place]) 08/10/2014   Chronic ethmoidal sinusitis 08/10/2014   Bronchitis 10/17/2010   PLANTAR FASCIITIS, LEFT 10/17/2010   OTHER NEUTROPENIA 04/21/2010   CALLUS, TOE 03/15/2010   LEUKOPENIA, MILD 02/20/2010   LIVER FUNCTION TESTS, ABNORMAL, HX OF 02/16/2010   DENTAL CARIES 02/15/2010   ONYCHOMYCOSIS 07/05/2009   ALLERGIC URTICARIA 07/05/2009   PAIN IN LIMB 07/05/2009   ERECTILE  DYSFUNCTION 05/25/2008   INFLUENZA 05/25/2008   VIRAL INFECTION 10/01/2007   HSV 05/02/2007   CONDYLOMA ACUMINATUM 05/02/2007   HYPERTENSION 05/02/2007   Chronic asthma, mild persistent, uncomplicated 18/29/9371   Gastroesophageal reflux disease without esophagitis 05/02/2007   PANCREATITIS 05/02/2007   Past Medical History:  Diagnosis Date   Abnormal liver function tests    Arthritis    Boil of buttock    COPD (chronic  obstructive pulmonary disease) (HCC)    Chronic Bronchitis.Advair inhaler    Dyspnea    GERD (gastroesophageal reflux disease)    takes Protonix daily   Gout    takes Allopurinol daily   Hemorrhoids    Hypertension    takes HCTZ daily   Hypomagnesemia    Leg ulcer (HCC)    Mild mitral regurgitation    Pancreatitis    10 years ago   Pneumonia 2016   Tricuspid regurgitation    Tubular adenoma of colon     Family History  Adopted: Yes  Problem Relation Age of Onset   Cancer Sister 45       unknown origin     Past Surgical History:  Procedure Laterality Date   AMPUTATION Left 07/14/2014   Procedure: 2nd Toe Amputation;  Surgeon: [name], MD;  Location: [facility];  Service: Orthopedics;  Laterality: Left;   AMPUTATION Left 11/16/2015   Procedure: Left Great Toe Amputation at Metatarsophalangeal Joint;  Surgeon: [name], MD;  Location: [facility];  Service: Orthopedics;  Laterality: Left;   AMPUTATION Right 06/13/2016   Procedure: RIGHT 2nd Toe Amputation;  Surgeon: [name], MD;  Location: [facility];  Service: Orthopedics;  Laterality: Right;   AMPUTATION Left 10/19/2016   Procedure: Amputation Left 3rd Toe;  Surgeon: [name], MD;  Location: [facility];  Service: Orthopedics;  Laterality: Left;   AMPUTATION Left 04/21/2021   Procedure: LEFT BELOW KNEE AMPUTATION;  Surgeon: [name], MD;  Location: [facility];  Service: Orthopedics;  Laterality: Left;   RIGHT/LEFT HEART CATH AND CORONARY ANGIOGRAPHY N/A 12/12/2020   Procedure: RIGHT/LEFT HEART CATH AND CORONARY ANGIOGRAPHY;  Surgeon: Lorretta Harp, MD;  Location: [facility];  Service: Cardiovascular;  Laterality: N/A;   TOE AMPUTATION Left 11/16/2015   left great toe    TOE AMPUTATION Right 06/13/2016   TONSILLECTOMY     Social History   Occupational History   Not on file  Tobacco Use   Smoking status: Never   Smokeless tobacco: Never  Vaping Use   Vaping Use: Never used  Substance and Sexual Activity    Alcohol use: Yes    Alcohol/week: 0.0 standard drinks    Comment: rarely   Drug use: No   Sexual activity: Not Currently

## 2021-06-22 NOTE — Progress Notes (Signed)
[facility] Consult Note   PCP: [name], NP PCP-Cardiologist: [name], MD  HF Cardiologist: Dr. Aundra Dubin  HPI: Mr Purdy is a 65 y.o.with history of COPD, HFrEF, GERD, gout, DMI, HTN, malnutrition, charcot foot with recent left BKA, and SVT.    He has been followed closely by Dr Julieanne Manson at [facility]. In February he presented for follow up and had been having runs of SVT . Cardiac monitor showed 23 runs of SVT. Placed on BB. He returned for follow up in April and ECHO showed reduced EF < 20% . He was set up for cath. Cath showed normal cors, elevated filling pressures, CO 5.2/CI 2.3. He returned for follow up in May and was volume overloaded so lasix was increased to 80 mg bid and Wilder Glade was added. He was seen again in July and there some concern for medication compliance.    Admitted 04/16/21 with infected left foot and progressive osteomyelitis.  Ortho consulted and he underwent R BKA. Placed IV antibiotics. Hospital course complicated by volume overload and SVT. Diuresed with IV lasix and transitioned to 20 mg po lasix. Discharged to [facility] with foley catheter.  Seen in TOC where GDMT adjusted.  Today he returns for HF follow up. He is not eating much and PT is wearing him out at his facility. He has some dyspnea with transfers, limited mostly by fatigue. He can bear some weight on his right leg. Denies CP, dizziness, edema. Sleeps reclined on a wedge. Appetite poor. No fever or chills. Weight is down, he was 190 lbs this summer. Taking all medications provided by facility. Still has wound on bottom.   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 He had been renting a room and recently evicted   Cardiac Testing  - 4/22 Echo 20-25% RV mildly reduced. R and L atria severely dilated.  - 2016 Echo EF 55-60%    - LHC/RHC  12/12/20  Normal coronaries RA 13 PA 48/23, mean  33 PCWP 25 LVEDP-23 CO 5.2 L/min CI 2.3 L/min/m  Past Medical History:  Diagnosis Date   Abnormal liver function tests    Arthritis    Boil of buttock    COPD (chronic obstructive pulmonary disease) (HCC)    Chronic Bronchitis.Advair inhaler    Dyspnea    GERD (gastroesophageal reflux disease)    takes Protonix daily   Gout    takes Allopurinol daily   Hemorrhoids    Hypertension    takes HCTZ daily   Hypomagnesemia    Leg ulcer (HCC)    Mild mitral regurgitation    Pancreatitis    10 years ago   Pneumonia 2016   Tricuspid regurgitation    Tubular adenoma of colon    Current Outpatient Medications  Medication Sig Dispense Refill   acetaminophen (TYLENOL) 325 MG tablet Take 2 tablets (650 mg total) by mouth every 6 (six) hours as needed for mild pain (or Fever >/= 101).     albuterol (VENTOLIN HFA) 108 (90 Base) MCG/ACT inhaler Inhale 2 puffs into the lungs every 4 (four) hours as needed for wheezing or shortness of breath. 54 g 1   ascorbic acid (VITAMIN C) 1000 MG tablet Take 1 tablet (1,000 mg total) by mouth daily. 30 tablet 0   azelastine (ASTELIN) 0.1 % nasal spray Place 1-2 sprays into both nostrils 2 (two) times daily as needed (nasal drainage). Use in each  nostril as directed 30 mL 5   budesonide-formoterol (SYMBICORT) 160-4.5 MCG/ACT inhaler USE 2 INHALATIONS TWICE A DAY 30.6 g 3   EPINEPHrine 0.3 mg/0.3 mL IJ SOAJ injection Inject 0.3 mg into the muscle as needed for anaphylaxis. 1 each 1   fluticasone (FLONASE) 50 MCG/ACT nasal spray Place 1 spray into both nostrils 2 (two) times daily as needed (nasal congestion). 16 g 5   HYDROcodone-acetaminophen (NORCO/VICODIN) 5-325 MG tablet Take 1-2 tablets by mouth every 6 (six) hours as needed for moderate pain or severe pain. 20 tablet 0   lactase (LACTAID) 3000 units tablet Take 3 tablets (9,000 Units total) by mouth 3 (three) times daily with meals. 180 tablet 0   loperamide (IMODIUM) 2 MG capsule Take 1 capsule (2 mg  total) by mouth as needed for diarrhea or loose stools. 30 capsule 0   metoprolol succinate (TOPROL XL) 25 MG 24 hr tablet Take 1 tablet (25 mg total) by mouth daily. 90 tablet 3   mirtazapine (REMERON) 7.5 MG tablet Take 7.5 mg by mouth at bedtime.     MITIGARE 0.6 MG CAPS Take 1 capsule by mouth as needed.     montelukast (SINGULAIR) 10 MG tablet Take 1 tablet (10 mg total) by mouth at bedtime. 90 tablet 2   Multiple Vitamin (MULTIVITAMIN WITH MINERALS) TABS tablet Take 1 tablet by mouth daily. 150 tablet 0   Nutritional Supplements (,FEEDING SUPPLEMENT, PROSOURCE PLUS) liquid Take 30 mLs by mouth 3 (three) times daily between meals. 900 mL 0   Olopatadine HCl 0.2 % SOLN Apply 1 drop to eye daily as needed (itchy/watery eyes). 2.5 mL 5   pantoprazole (PROTONIX) 40 MG tablet TAKE ONE TABLET BY MOUTH 30 TO 60 MINUTES BEFORE YOUR FIRST AND LAST MEALS OF THE DAY 180 tablet 3   polyethylene glycol (MIRALAX / GLYCOLAX) 17 g packet Take 17 g by mouth daily as needed for mild constipation. 14 each 0   Probiotic Product (SUPER PROBIOTIC DIGESTIVE) CAPS Take 1 tablet by mouth as needed (stomach upset). Take as needed with meals     sacubitril-valsartan (ENTRESTO) 24-26 MG Take 1 tablet by mouth 2 (two) times daily. 60 tablet 2   SPIRULINA PO Take 1 capsule by mouth 2 (two) times daily with a meal.     tamsulosin (FLOMAX) 0.4 MG CAPS capsule Take 1 capsule (0.4 mg total) by mouth daily after supper. 30 capsule 0   Trolamine Salicylate (BLUE-EMU HEMP EX) Apply 1 application topically daily as needed.     zinc sulfate 220 (50 Zn) MG capsule Take 1 capsule (220 mg total) by mouth daily. 30 capsule 0   furosemide (LASIX) 40 MG tablet Take 2 tablets (80 mg total) by mouth daily. 90 tablet 3   No current facility-administered medications for this encounter.   Allergies  Allergen Reactions   Ciprofloxacin Rash and Other (See Comments)    SYNCOPE    Lactose Intolerance (Gi) Diarrhea   Shellfish Allergy  Anaphylaxis   Sulfa Antibiotics Other (See Comments)    SYNCOPE DIZZINESS    Social History   Socioeconomic History   Marital status: Single    Spouse name: Not on file   Number of children: 1   Years of education:     Highest education level: Some college, no degree  Occupational History   Not on file  Tobacco Use   Smoking status: Never   Smokeless tobacco: Never  Vaping Use   Vaping Use: Never used  Substance and  Sexual Activity   Alcohol use: Yes    Alcohol/week: 0.0 standard drinks    Comment: rarely   Drug use: No   Sexual activity: Not Currently  Other Topics Concern   Not on file  Social History Narrative   Not on file   Social Determinants of Health   Financial Resource Strain: Medium Risk   Difficulty of Paying Living Expenses: Somewhat hard  Food Insecurity: Food Insecurity Present   Worried About Running Out of Food in the Last Year: Sometimes true   Ran Out of Food in the Last Year: Never true  Transportation Needs: No Transportation Needs   Lack of Transportation (Medical): No   Lack of Transportation (Non-Medical): No  Physical Activity: Not on file  Stress: Not on file  Social Connections: Not on file  Intimate Partner Violence: Not on file   Family History  Adopted: Yes  Problem Relation Age of Onset   Cancer Sister 3       unknown origin    BP 102/74   Pulse (!) 114   Wt Readings from Last 3 Encounters:  04/16/21 90.7 kg (200 lb)  02/22/21 91.2 kg (201 lb)  02/14/21 93 kg (205 lb)   PHYSICAL EXAM: General:  NAD. No resp difficulty, arrived in [facility], cachectic. HEENT: Normal, +temporal wasting Neck: Supple. JVP to jaw. Carotids 2+ bilat; no bruits. No lymphadenopathy or thryomegaly appreciated. Cor: PMI nondisplaced. Tachy rate & rhythm. No rubs, gallops or murmurs. Lungs: Clear, diminished in bases. Abdomen: Soft, nontender, nondistended. No hepatosplenomegaly. No bruits or masses. Good bowel sounds. Extremities: No cyanosis,  clubbing, rash, L BKA; 2+ edema RLE, +2 edema to left thigh. Neuro: Alert & oriented x 3, cranial nerves grossly intact. Moves all 4 extremities w/o difficulty. Affect pleasant.  ReDs: 29%  ASSESSMENT & PLAN: Chronic HFrEF, NICM  - Had LHC with normal cors back in April 2022. Unclear etiology? Tachy mediated with H/O SVT.  - Echo 11/2020 EF 20-25%.  - NYHA III, functional status difficult due to physical deconditioning and left BKA but he has dyspnea with transfers.  - Appears volume overloaded today on exam however, Reds Clip 29%. Last weight per SNF,199.5 lbs on 06/22/21, ? Accuracy. - Place UNNA boot to right leg. - Increase lasix to 80 mg daily. - No BP room to start spiro today. - Continue Toprol XL 25 mg daily.  - Continue Entresto 24-26 mg bid. - No SGLT2i with foley catheter for now.  - Repeat Echo. - BMET & BNP today, repeat BMET in 10 days.   2. COPD  - Followed by Dr Melvyn Novas. - No wheezes on exam today.    3. Superficial Vein Thrombosis R basilic vein - 3/87/56 [place] RUE. No DVT.  - Will need to evaluated.    4. H/o SVT - Continue current dose bb.   5. H/o L Foot Osteo with BKA - Working with rehab at [facility] on starting gait training with prosthesis soon.  6. LUTS/BPH - He was discharged from hospital with foley, previously retaining 1000 cc. - Referred to Urology, needs appt asap. - Continue Flomax.  7. Physical Deconditioning - Continue PT/OT at facility. - He appears malnourished, but can't get accurate weights. Says he does not like food at facility. - Encouraged increased protein intake, nutritional supplements.   Follow up with Dr. Aundra Dubin as scheduled + echo.  Rush, FNP 06/23/21

## 2021-06-23 ENCOUNTER — Encounter (HOSPITAL_COMMUNITY): Payer: Self-pay

## 2021-06-23 ENCOUNTER — Ambulatory Visit (HOSPITAL_COMMUNITY)
Admission: RE | Admit: 2021-06-23 | Discharge: 2021-06-23 | Disposition: A | Discharge status: Home / Self Care | Payer: BC Managed Care – PPO | Source: Ambulatory Visit | Attending: Family Medicine | Admitting: Family Medicine

## 2021-06-23 ENCOUNTER — Other Ambulatory Visit: Payer: Self-pay

## 2021-06-23 VITALS — BP 102/74 | HR 114

## 2021-06-23 DIAGNOSIS — I5020 Unspecified systolic (congestive) heart failure: Secondary | ICD-10-CM | POA: Diagnosis not present

## 2021-06-23 DIAGNOSIS — I428 Other cardiomyopathies: Secondary | ICD-10-CM | POA: Diagnosis not present

## 2021-06-23 DIAGNOSIS — Z596 Low income: Secondary | ICD-10-CM | POA: Diagnosis not present

## 2021-06-23 DIAGNOSIS — K219 Gastro-esophageal reflux disease without esophagitis: Secondary | ICD-10-CM | POA: Diagnosis not present

## 2021-06-23 DIAGNOSIS — Z881 Allergy status to other antibiotic agents status: Secondary | ICD-10-CM | POA: Insufficient documentation

## 2021-06-23 DIAGNOSIS — I82890 Acute embolism and thrombosis of other specified veins: Secondary | ICD-10-CM

## 2021-06-23 DIAGNOSIS — Z89511 Acquired absence of right leg below knee: Secondary | ICD-10-CM | POA: Diagnosis not present

## 2021-06-23 DIAGNOSIS — Z89512 Acquired absence of left leg below knee: Secondary | ICD-10-CM | POA: Insufficient documentation

## 2021-06-23 DIAGNOSIS — I5022 Chronic systolic (congestive) heart failure: Secondary | ICD-10-CM | POA: Diagnosis not present

## 2021-06-23 DIAGNOSIS — I82611 Acute embolism and thrombosis of superficial veins of right upper extremity: Secondary | ICD-10-CM | POA: Insufficient documentation

## 2021-06-23 DIAGNOSIS — R5381 Other malaise: Secondary | ICD-10-CM

## 2021-06-23 DIAGNOSIS — I110 Hypertensive heart disease with heart failure: Secondary | ICD-10-CM | POA: Diagnosis not present

## 2021-06-23 DIAGNOSIS — E109 Type 1 diabetes mellitus without complications: Secondary | ICD-10-CM | POA: Diagnosis not present

## 2021-06-23 DIAGNOSIS — Z8739 Personal history of other diseases of the musculoskeletal system and connective tissue: Secondary | ICD-10-CM

## 2021-06-23 DIAGNOSIS — N401 Enlarged prostate with lower urinary tract symptoms: Secondary | ICD-10-CM

## 2021-06-23 DIAGNOSIS — J449 Chronic obstructive pulmonary disease, unspecified: Secondary | ICD-10-CM

## 2021-06-23 DIAGNOSIS — I471 Supraventricular tachycardia: Secondary | ICD-10-CM

## 2021-06-23 DIAGNOSIS — N400 Enlarged prostate without lower urinary tract symptoms: Secondary | ICD-10-CM | POA: Insufficient documentation

## 2021-06-23 LAB — BASIC METABOLIC PANEL
Anion gap: 10 (ref 5–15)
BUN: 43 mg/dL — ABNORMAL HIGH (ref 8–23)
CO2: 24 mmol/L (ref 22–32)
Calcium: 8.6 mg/dL — ABNORMAL LOW (ref 8.9–10.3)
Chloride: 101 mmol/L (ref 98–111)
Creatinine, Ser: 1.06 mg/dL (ref 0.61–1.24)
GFR, Estimated: >60 mL/min (ref >60)
Glucose, Bld: 109 mg/dL — ABNORMAL HIGH (ref 70–99)
Potassium: 4.5 mmol/L (ref 3.5–5.1)
Sodium: 135 mmol/L (ref 135–145)

## 2021-06-23 LAB — BRAIN NATRIURETIC PEPTIDE: B Natriuretic Peptide: >4500 pg/mL — ABNORMAL HIGH (ref 0.0–100.0)

## 2021-06-23 MED ORDER — FUROSEMIDE 40 MG PO TABS: 80.0000 mg | ORAL_TABLET | Freq: Every day | ORAL | 3 refills | Status: DC

## 2021-06-23 NOTE — Progress Notes (Signed)
ReDS Vest / Clip - 06/23/21 1300       ReDS Vest / Clip   Station Marker C    Ruler Value 25    ReDS Value Range Low volume    ReDS Actual Value 29

## 2021-06-23 NOTE — Patient Instructions (Addendum)
Following orders sent back to [facility]:  Increase Furosemide to 80 mg Daily Labs done today (bmet, bnp) Please draw labs (bmp) in 10 days and fax results to [phone] UNNA boots to R leg Weigh weekly Follow up with [facility] ASAP Keep follow up as scheduled 08/07/21

## 2021-06-26 DIAGNOSIS — N1832 Chronic kidney disease, stage 3b: Secondary | ICD-10-CM | POA: Diagnosis not present

## 2021-06-26 DIAGNOSIS — R748 Abnormal levels of other serum enzymes: Secondary | ICD-10-CM | POA: Diagnosis not present

## 2021-06-26 DIAGNOSIS — R630 Anorexia: Secondary | ICD-10-CM | POA: Diagnosis not present

## 2021-06-28 ENCOUNTER — Emergency Department (HOSPITAL_COMMUNITY)
Admission: EM | Admit: 2021-06-28 | Discharge: 2021-06-28 | Disposition: A | Discharge status: Home / Self Care | Payer: BC Managed Care – PPO | Attending: Emergency Medicine | Admitting: Emergency Medicine

## 2021-06-28 ENCOUNTER — Emergency Department (HOSPITAL_COMMUNITY): Payer: BC Managed Care – PPO

## 2021-06-28 DIAGNOSIS — Z79899 Other long term (current) drug therapy: Secondary | ICD-10-CM | POA: Diagnosis not present

## 2021-06-28 DIAGNOSIS — I10 Essential (primary) hypertension: Secondary | ICD-10-CM | POA: Diagnosis not present

## 2021-06-28 DIAGNOSIS — Z89512 Acquired absence of left leg below knee: Secondary | ICD-10-CM | POA: Insufficient documentation

## 2021-06-28 DIAGNOSIS — Z960 Presence of urogenital implants: Secondary | ICD-10-CM | POA: Insufficient documentation

## 2021-06-28 DIAGNOSIS — J449 Chronic obstructive pulmonary disease, unspecified: Secondary | ICD-10-CM | POA: Insufficient documentation

## 2021-06-28 DIAGNOSIS — Z89421 Acquired absence of other right toe(s): Secondary | ICD-10-CM | POA: Insufficient documentation

## 2021-06-28 DIAGNOSIS — E162 Hypoglycemia, unspecified: Secondary | ICD-10-CM | POA: Diagnosis not present

## 2021-06-28 DIAGNOSIS — I517 Cardiomegaly: Secondary | ICD-10-CM | POA: Diagnosis not present

## 2021-06-28 DIAGNOSIS — R4182 Altered mental status, unspecified: Secondary | ICD-10-CM | POA: Diagnosis not present

## 2021-06-28 DIAGNOSIS — E11649 Type 2 diabetes mellitus with hypoglycemia without coma: Secondary | ICD-10-CM | POA: Diagnosis not present

## 2021-06-28 DIAGNOSIS — R000 Tachycardia, unspecified: Secondary | ICD-10-CM | POA: Diagnosis not present

## 2021-06-28 LAB — COMPREHENSIVE METABOLIC PANEL
ALT: 10 U/L (ref 0–44)
AST: 25 U/L (ref 15–41)
Albumin: 2.4 g/dL — ABNORMAL LOW (ref 3.5–5.0)
Alkaline Phosphatase: 189 U/L — ABNORMAL HIGH (ref 38–126)
Anion gap: 13 (ref 5–15)
BUN: 56 mg/dL — ABNORMAL HIGH (ref 8–23)
CO2: 24 mmol/L (ref 22–32)
Calcium: 9.1 mg/dL (ref 8.9–10.3)
Chloride: 99 mmol/L (ref 98–111)
Creatinine, Ser: 1.35 mg/dL — ABNORMAL HIGH (ref 0.61–1.24)
GFR, Estimated: 58 mL/min — ABNORMAL LOW (ref >60)
Glucose, Bld: 112 mg/dL — ABNORMAL HIGH (ref 70–99)
Potassium: 5.4 mmol/L — ABNORMAL HIGH (ref 3.5–5.1)
Sodium: 136 mmol/L (ref 135–145)
Total Bilirubin: 1.6 mg/dL — ABNORMAL HIGH (ref 0.3–1.2)
Total Protein: 7.9 g/dL (ref 6.5–8.1)

## 2021-06-28 LAB — CBC WITH DIFFERENTIAL/PLATELET
Abs Immature Granulocytes: 0.03 10*3/uL (ref 0.00–0.07)
Basophils Absolute: 0 10*3/uL (ref 0.0–0.1)
Basophils Relative: 0 %
Eosinophils Absolute: 0 10*3/uL (ref 0.0–0.5)
Eosinophils Relative: 0 %
HCT: 50.3 % (ref 39.0–52.0)
Hemoglobin: 15.4 g/dL (ref 13.0–17.0)
Immature Granulocytes: 1 %
Lymphocytes Relative: 19 %
Lymphs Abs: 0.9 10*3/uL (ref 0.7–4.0)
MCH: 27.4 pg (ref 26.0–34.0)
MCHC: 30.6 g/dL (ref 30.0–36.0)
MCV: 89.5 fL (ref 80.0–100.0)
Monocytes Absolute: 0.2 10*3/uL (ref 0.1–1.0)
Monocytes Relative: 5 %
Neutro Abs: 3.6 10*3/uL (ref 1.7–7.7)
Neutrophils Relative %: 75 %
Platelets: 258 10*3/uL (ref 150–400)
RBC: 5.62 MIL/uL (ref 4.22–5.81)
RDW: 20 % — ABNORMAL HIGH (ref 11.5–15.5)
WBC: 4.8 10*3/uL (ref 4.0–10.5)
nRBC: 0.6 % — ABNORMAL HIGH (ref 0.0–0.2)

## 2021-06-28 LAB — CBG MONITORING, ED
Glucose-Capillary: 124 mg/dL — ABNORMAL HIGH (ref 70–99)
Glucose-Capillary: <10 mg/dL — CL (ref 70–99)
Glucose-Capillary: 121 mg/dL — ABNORMAL HIGH (ref 70–99)
Glucose-Capillary: 134 mg/dL — ABNORMAL HIGH (ref 70–99)

## 2021-06-28 LAB — URINALYSIS, ROUTINE W REFLEX MICROSCOPIC
Bilirubin Urine: NEGATIVE
Glucose, UA: NEGATIVE mg/dL
Ketones, ur: NEGATIVE mg/dL
Nitrite: NEGATIVE
Protein, ur: 100 mg/dL — AB
Specific Gravity, Urine: 1.016 (ref 1.005–1.030)
WBC, UA: >50 WBC/hpf — ABNORMAL HIGH (ref 0–5)
pH: 5 (ref 5.0–8.0)

## 2021-06-28 LAB — I-STAT CHEM 8, ED
BUN: 67 mg/dL — ABNORMAL HIGH (ref 8–23)
Calcium, Ion: 1.07 mmol/L — ABNORMAL LOW (ref 1.15–1.40)
Chloride: 103 mmol/L (ref 98–111)
Creatinine, Ser: 1.2 mg/dL (ref 0.61–1.24)
Glucose, Bld: 107 mg/dL — ABNORMAL HIGH (ref 70–99)
HCT: 55 % — ABNORMAL HIGH (ref 39.0–52.0)
Hemoglobin: 18.7 g/dL — ABNORMAL HIGH (ref 13.0–17.0)
Potassium: 5.4 mmol/L — ABNORMAL HIGH (ref 3.5–5.1)
Sodium: 138 mmol/L (ref 135–145)
TCO2: 25 mmol/L (ref 22–32)

## 2021-06-28 LAB — LIPASE, BLOOD: Lipase: 43 U/L (ref 11–51)

## 2021-06-28 MED ORDER — FOSFOMYCIN TROMETHAMINE 3 G PO PACK
3.0000 g | PACK | Freq: Once | ORAL | Status: AC
  Administered 2021-06-28: 3 g via ORAL
  Filled 2021-06-28: qty 3

## 2021-06-28 MED ORDER — DEXTROSE 50 % IV SOLN: 1.0000 | Freq: Once | INTRAVENOUS | Status: DC

## 2021-06-28 NOTE — ED Provider Notes (Signed)
[facility] EMERGENCY DEPARTMENT Provider Note   CSN: [id number] Arrival date & time: 06/28/21  0855     History Chief Complaint  Patient presents with   Hypoglycemia    Stephen Whitaker is a 65 y.o. male.  Patient had low blood sugar at skilled nursing facility today.  Supposedly had a blood sugar of 44 was given oral glucose and blood sugar improved to 68.  Has had decreased p.o. intake.  He has been taking mostly supplement drinks for his nutrition.  Patient denies any pain.  Overall he is awake and alert.  He states that he remembers him taking his blood sugar.  Did not seem to have a change in his mental status but it appears that they check his blood sugar in the morning typically.  He denies any nausea, vomiting, chest pain.  He does have a urinary catheter in place.  Denies any fevers or chills.  The history is provided by the patient and the EMS personnel.  Hypoglycemia Initial blood sugar:  Undectable Blood sugar after intervention:  68 Severity:  Mild Chronicity:  New Diabetic status:  Non-diabetic Context: decreased oral intake   Relieved by:  Oral glucose Associated symptoms: no altered mental status, no anxiety, no decreased responsiveness, no dizziness, no obesity, no seizures, no shortness of breath, no speech difficulty, no sweats, no tremors, no visual change, no vomiting and no weakness       Past Medical History:  Diagnosis Date   Abnormal liver function tests    Arthritis    Boil of buttock    COPD (chronic obstructive pulmonary disease) (HCC)    Chronic Bronchitis.Advair inhaler    Dyspnea    GERD (gastroesophageal reflux disease)    takes Protonix daily   Gout    takes Allopurinol daily   Hemorrhoids    Hypertension    takes HCTZ daily   Hypomagnesemia    Leg ulcer (HCC)    Mild mitral regurgitation    Pancreatitis    10 years ago   Pneumonia 2016   Tricuspid regurgitation    Tubular adenoma of colon     Patient Active  Problem List   Diagnosis Date Noted   Urinary catheter in place 05/31/2021   Below-knee amputation of left lower extremity ([place]) 05/19/2021   Seasonal and perennial allergic rhinoconjunctivitis 05/08/2021   Oral thrush 05/08/2021   Pressure injury of skin 04/17/2021   Protein-calorie malnutrition, severe 04/17/2021   Osteomyelitis of left foot ([place]) 04/16/2021   Diabetic foot infection ([place])    Fatigue 02/22/2021   Nonadherence to medication 02/15/2021   Asthma 01/30/2021   Other adverse food reactions, not elsewhere classified, subsequent encounter 01/30/2021   LVH (left ventricular hypertrophy) 01/04/2021   Acute on chronic systolic heart failure ([place]) 12/08/2020   HFrEF (heart failure with reduced ejection fraction) ([place]) 10/14/2020   Nonspecific abnormal electrocardiogram (ECG) (EKG) 10/14/2020   Atrial tachycardia ([place]) 10/14/2020   Non-pressure chronic ulcer of other part of left foot limited to breakdown of skin ([place]) 10/12/2020   Leg swelling 10/06/2020   DOE (dyspnea on exertion) 10/06/2020   Thrombocytopenia ([place]) 06/26/2019   Acquired absence of other right toe(s) ([place]) 02/19/2018   Boil of buttock 10/24/2016   Amputated toe of left foot ([place]) 10/19/2016   Amputated toe, right ([place]) 06/13/2016   Chronic bronchitis ([place]) 11/24/2015   Other pancytopenia ([place]) 01/24/2015   Chronic pancreatitis ([place]) 01/24/2015   Blood poisoning    Neutropenia ([place])  Iron deficiency anemia due to chronic blood loss    Leukopenia    Sepsis ([place]) 01/21/2015   Leucopenia 01/21/2015   Anemia 01/21/2015   Syncope 01/21/2015   Diarrhea 01/21/2015   Hypokalemia 01/21/2015   Loss of consciousness ([place])    Eczema 10/21/2014   Essential hypertension 10/21/2014   Vasovagal syncope 10/21/2014   Other chronic pancreatitis ([place]) 08/10/2014   COLD (chronic obstructive lung disease) ([place]) 08/10/2014   Chronic ethmoidal sinusitis 08/10/2014   Bronchitis 10/17/2010   PLANTAR FASCIITIS, LEFT  10/17/2010   OTHER NEUTROPENIA 04/21/2010   CALLUS, TOE 03/15/2010   LEUKOPENIA, MILD 02/20/2010   LIVER FUNCTION TESTS, ABNORMAL, HX OF 02/16/2010   DENTAL CARIES 02/15/2010   ONYCHOMYCOSIS 07/05/2009   ALLERGIC URTICARIA 07/05/2009   PAIN IN LIMB 07/05/2009   ERECTILE DYSFUNCTION 05/25/2008   INFLUENZA 05/25/2008   VIRAL INFECTION 10/01/2007   HSV 05/02/2007   CONDYLOMA ACUMINATUM 05/02/2007   HYPERTENSION 05/02/2007   Chronic asthma, mild persistent, uncomplicated 97/67/3419   Gastroesophageal reflux disease without esophagitis 05/02/2007   PANCREATITIS 05/02/2007    Past Surgical History:  Procedure Laterality Date   AMPUTATION Left 07/14/2014   Procedure: 2nd Toe Amputation;  Surgeon: [name], MD;  Location: [place];  Service: Orthopedics;  Laterality: Left;   AMPUTATION Left 11/16/2015   Procedure: Left Great Toe Amputation at Metatarsophalangeal Joint;  Surgeon: [name], MD;  Location: [place];  Service: Orthopedics;  Laterality: Left;   AMPUTATION Right 06/13/2016   Procedure: RIGHT 2nd Toe Amputation;  Surgeon: [name], MD;  Location: [place];  Service: Orthopedics;  Laterality: Right;   AMPUTATION Left 10/19/2016   Procedure: Amputation Left 3rd Toe;  Surgeon: [name], MD;  Location: [place];  Service: Orthopedics;  Laterality: Left;   AMPUTATION Left 04/21/2021   Procedure: LEFT BELOW KNEE AMPUTATION;  Surgeon: [name], MD;  Location: [place];  Service: Orthopedics;  Laterality: Left;   RIGHT/LEFT HEART CATH AND CORONARY ANGIOGRAPHY N/A 12/12/2020   Procedure: RIGHT/LEFT HEART CATH AND CORONARY ANGIOGRAPHY;  Surgeon: Lorretta Harp, MD;  Location: [place] [facility];  Service: Cardiovascular;  Laterality: N/A;   TOE AMPUTATION Left 11/16/2015   left great toe    TOE AMPUTATION Right 06/13/2016   TONSILLECTOMY         Family History  Adopted: Yes  Problem Relation Age of Onset   Cancer Sister 51       unknown origin     Social History    Tobacco Use   Smoking status: Never   Smokeless tobacco: Never  Vaping Use   Vaping Use: Never used  Substance Use Topics   Alcohol use: Yes    Alcohol/week: 0.0 standard drinks    Comment: rarely   Drug use: No    Home Medications Prior to Admission medications   Medication Sig Start Date End Date Taking? Authorizing Provider  acetaminophen (TYLENOL) 325 MG tablet Take 2 tablets (650 mg total) by mouth every 6 (six) hours as needed for mild pain (or Fever >/= 101). 04/25/21   Nita Sells, MD  albuterol (VENTOLIN HFA) 108 (90 Base) MCG/ACT inhaler Inhale 2 puffs into the lungs every 4 (four) hours as needed for wheezing or shortness of breath. 10/06/20   Tanda Rockers, MD  ascorbic acid (VITAMIN C) 1000 MG tablet Take 1 tablet (1,000 mg total) by mouth daily. 05/03/21   Lavina Hamman, MD  azelastine (ASTELIN) 0.1 % nasal spray Place  1-2 sprays into both nostrils 2 (two) times daily as needed (nasal drainage). Use in each nostril as directed 01/30/21   Garnet Sierras, DO  budesonide-formoterol (SYMBICORT) 160-4.5 MCG/ACT inhaler USE 2 INHALATIONS TWICE A DAY 03/13/21   Tanda Rockers, MD  EPINEPHrine 0.3 mg/0.3 mL IJ SOAJ injection Inject 0.3 mg into the muscle as needed for anaphylaxis. 05/08/21   Garnet Sierras, DO  fluticasone (FLONASE) 50 MCG/ACT nasal spray Place 1 spray into both nostrils 2 (two) times daily as needed (nasal congestion). 05/08/21   Garnet Sierras, DO  furosemide (LASIX) 40 MG tablet Take 2 tablets (80 mg total) by mouth daily. 06/23/21   [place], Maricela Bo, FNP  HYDROcodone-acetaminophen (NORCO/VICODIN) 5-325 MG tablet Take 1-2 tablets by mouth every 6 (six) hours as needed for moderate pain or severe pain. 04/26/21   Lavina Hamman, MD  lactase (LACTAID) 3000 units tablet Take 3 tablets (9,000 Units total) by mouth 3 (three) times daily with meals. 05/02/21   Lavina Hamman, MD  loperamide (IMODIUM) 2 MG capsule Take 1 capsule (2 mg total) by mouth as needed for  diarrhea or loose stools. 05/02/21   Lavina Hamman, MD  metoprolol succinate (TOPROL XL) 25 MG 24 hr tablet Take 1 tablet (25 mg total) by mouth daily. 01/04/21   Chandrasekhar, Lyda Kalata A, MD  mirtazapine (REMERON) 7.5 MG tablet Take 7.5 mg by mouth at bedtime.    [provider]  MITIGARE 0.6 MG CAPS Take 1 capsule by mouth as needed. 02/02/21   [provider]  montelukast (SINGULAIR) 10 MG tablet Take 1 tablet (10 mg total) by mouth at bedtime. 05/08/21   Garnet Sierras, DO  Multiple Vitamin (MULTIVITAMIN WITH MINERALS) TABS tablet Take 1 tablet by mouth daily. 05/03/21   Lavina Hamman, MD  Nutritional Supplements (,FEEDING SUPPLEMENT, PROSOURCE PLUS) liquid Take 30 mLs by mouth 3 (three) times daily between meals. 05/02/21   Lavina Hamman, MD  Olopatadine HCl 0.2 % SOLN Apply 1 drop to eye daily as needed (itchy/watery eyes). 01/30/21   Garnet Sierras, DO  pantoprazole (PROTONIX) 40 MG tablet TAKE ONE TABLET BY MOUTH 30 TO 60 MINUTES BEFORE YOUR FIRST AND LAST MEALS OF THE DAY 01/25/21   Tanda Rockers, MD  polyethylene glycol (MIRALAX / GLYCOLAX) 17 g packet Take 17 g by mouth daily as needed for mild constipation. 04/25/21   Nita Sells, MD  Probiotic Product (SUPER PROBIOTIC DIGESTIVE) CAPS Take 1 tablet by mouth as needed (stomach upset). Take as needed with meals    [provider]  sacubitril-valsartan (ENTRESTO) 24-26 MG Take 1 tablet by mouth 2 (two) times daily. 01/24/21   Chandrasekhar, Mahesh A, MD  SPIRULINA PO Take 1 capsule by mouth 2 (two) times daily with a meal.    [provider]  tamsulosin (FLOMAX) 0.4 MG CAPS capsule Take 1 capsule (0.4 mg total) by mouth daily after supper. 04/25/21   Nita Sells, MD  Trolamine Salicylate (BLUE-EMU HEMP EX) Apply 1 application topically daily as needed.    [provider]  zinc sulfate 220 (50 Zn) MG capsule Take 1 capsule (220 mg total) by mouth daily. 05/03/21   Lavina Hamman, MD     Allergies    Ciprofloxacin, Lactose intolerance (gi), Shellfish allergy, and Sulfa antibiotics  Review of Systems   Review of Systems  Constitutional:  Negative for chills, decreased responsiveness, diaphoresis and fever.  HENT:  Negative for ear pain and  sore throat.   Eyes:  Negative for pain and visual disturbance.  Respiratory:  Negative for cough and shortness of breath.   Cardiovascular:  Negative for chest pain and palpitations.  Gastrointestinal:  Negative for abdominal pain and vomiting.  Genitourinary:  Negative for dysuria and hematuria.  Musculoskeletal:  Negative for arthralgias and back pain.  Skin:  Negative for color change and rash.  Neurological:  Negative for dizziness, tremors, seizures, syncope, speech difficulty and weakness.  Psychiatric/Behavioral:  The patient is not nervous/anxious.   All other systems reviewed and are negative.  Physical Exam Updated Vital Signs BP (!) 112/92   Pulse 100   Temp (!) 96.9 F (36.1 C) (Axillary) Comment: MD aware, at bedside  Resp 13   SpO2 96%   Physical Exam Vitals and nursing note reviewed.  Constitutional:      General: He is not in acute distress.    Appearance: He is underweight. He is not ill-appearing.  HENT:     Head: Normocephalic and atraumatic.     Nose: Nose normal.     Mouth/Throat:     Mouth: Mucous membranes are moist.  Eyes:     Extraocular Movements: Extraocular movements intact.     Conjunctiva/sclera: Conjunctivae normal.     Pupils: Pupils are equal, round, and reactive to light.  Cardiovascular:     Rate and Rhythm: Normal rate and regular rhythm.     Pulses: Normal pulses.     Heart sounds: Normal heart sounds. No murmur heard. Pulmonary:     Effort: Pulmonary effort is normal. No respiratory distress.     Breath sounds: Normal breath sounds.  Abdominal:     Palpations: Abdomen is soft.     Tenderness: There is no abdominal tenderness.  Musculoskeletal:     Cervical back: Normal  range of motion and neck supple.  Skin:    General: Skin is warm and dry.     Capillary Refill: Capillary refill takes less than 2 seconds.  Neurological:     General: No focal deficit present.     Mental Status: He is alert.     Cranial Nerves: No cranial nerve deficit.     Sensory: No sensory deficit.     Motor: No weakness.  Psychiatric:        Mood and Affect: Mood normal.    ED Results / Procedures / Treatments   Labs (all labs ordered are listed, but only abnormal results are displayed) Labs Reviewed  CBC WITH DIFFERENTIAL/PLATELET - Abnormal; Notable for the following components:      Result Value   RDW 20.0 (*)    nRBC 0.6 (*)    All other components within normal limits  COMPREHENSIVE METABOLIC PANEL - Abnormal; Notable for the following components:   Potassium 5.4 (*)    Glucose, Bld 112 (*)    BUN 56 (*)    Creatinine, Ser 1.35 (*)    Albumin 2.4 (*)    Alkaline Phosphatase 189 (*)    Total Bilirubin 1.6 (*)    GFR, Estimated 58 (*)    All other components within normal limits  URINALYSIS, ROUTINE W REFLEX MICROSCOPIC - Abnormal; Notable for the following components:   Color, Urine AMBER (*)    APPearance CLOUDY (*)    Hgb urine dipstick MODERATE (*)    Protein, ur 100 (*)    Leukocytes,Ua LARGE (*)    WBC, UA >50 (*)    Bacteria, UA MANY (*)    All  other components within normal limits  CBG MONITORING, ED - Abnormal; Notable for the following components:   Glucose-Capillary <10 (*)    All other components within normal limits  I-STAT CHEM 8, ED - Abnormal; Notable for the following components:   Potassium 5.4 (*)    BUN 67 (*)    Glucose, Bld 107 (*)    Calcium, Ion 1.07 (*)    Hemoglobin 18.7 (*)    HCT 55.0 (*)    All other components within normal limits  CBG MONITORING, ED - Abnormal; Notable for the following components:   Glucose-Capillary 124 (*)    All other components within normal limits  CBG MONITORING, ED - Abnormal; Notable for the  following components:   Glucose-Capillary 121 (*)    All other components within normal limits  URINE CULTURE  LIPASE, BLOOD    EKG EKG Interpretation  Date/Time:  Wednesday June 28 2021 09:13:00 EST Ventricular Rate:  99 PR Interval:  158 QRS Duration: 95 QT Interval:  367 QTC Calculation: 471 R Axis:   28 Text Interpretation: Sinus tachycardia Atrial premature complex Probable left atrial enlargement Confirmed by Lennice Sites ([id number]) on 06/28/2021 9:42:51 AM  Radiology DG Chest Portable 1 View  Result Date: 06/28/2021 CLINICAL DATA:  Hypoglycemia, altered mental status. EXAM: PORTABLE CHEST 1 VIEW COMPARISON:  April 15, 2021. FINDINGS: Stable cardiomegaly. Increased bibasilar opacities are noted concerning for edema or atelectasis with associated pleural effusions. Bony thorax is unremarkable. IMPRESSION: Increased bibasilar opacities are noted concerning for edema or atelectasis with associated pleural effusions. Electronically Signed   By: Marijo Conception M.D.   On: 06/28/2021 09:31    Procedures Procedures   Medications Ordered in ED Medications  dextrose 50 % solution 50 mL (50 mLs Intravenous Not Given 06/28/21 1041)  fosfomycin (MONUROL) packet 3 g (has no administration in time range)    ED Course  I have reviewed the triage vital signs and the nursing notes.  Pertinent labs & imaging results that were available during my care of the patient were reviewed by me and considered in my medical decision making (see chart for details).    MDM Rules/Calculators/A&P                           Demarquez NEFI MUSICH is here for evaluation of low blood sugar.  Patient with CBG done here that is undetectable.  However patient is awake and alert.  He states he has a history of poor circulation in his hands.  Does have poor p.o. intake.  Is cachectic.  Mostly just drink supplemental shakes as his primary diet.  Has history of chronic pancreatitis.  Peripheral vascular disease.   Recent left BKA due to osteomyelitis in his foot.  He has not had any chest pain or shortness of breath or fever or chills.  Blood sugar was checked this morning routinely at his nursing home and was found to be may be in the 40s..Oral sugar by EMS and blood sugar was in the 70s.  I stuck a peripheral IV and check the blood sugar there which was 123.  He has good pulses throughout but he does appear to have some Raynaud's changes to his hands.  This is actually documented at his last hospital stay where he would have intermittent low blood sugars that were thought to be due to Raynaud's/circulation issues in his hands.  He had extensive work-up including C-peptide levels and insulin levels  are all unremarkable.  He also had a CT scan of his abdomen pelvis that was unremarkable.  Suspect that is the cause of his suspected hypoglycemia.  His mental status has been normal the whole time and have low suspicion that this is a true hypoglycemic event.  Will check basic labs and do some serial CBGs to use peripheral IV.  Lab work is overall unremarkable.  Serial CBGs have been in the 100.  Mental status is stable.  Overall suspect false hypoglycemia in the setting of likely underperfused fingertips.  This appears to be a chronic issue upon chart review.  Urinalysis with some leukocytes in it but overall he has no leukocytosis or fever.  We will treat with a dose of fosfomycin and send off urine culture.  No concern for sepsis.  Lab work is overall unremarkable.  Discharged back to facility.  Will recommend that they check blood sugars after warming his fingertips or check in a different location.  Basically patient had normal blood sugar without any real intervention here.  This chart was dictated using voice recognition software.  Despite best efforts to proofread,  errors can occur which can change the documentation meaning.   Final Clinical Impression(s) / ED Diagnoses Final diagnoses:  Hypoglycemia    Rx /  DC Orders ED Discharge Orders     None        Lennice Sites, DO 06/28/21 1052

## 2021-06-28 NOTE — ED Notes (Signed)
Dr. Ronnald Nian at bedside. Multiple IV attempts by RN's.

## 2021-06-28 NOTE — Discharge Instructions (Signed)
Overall blood sugars were normal here.  Suspect that there are falsely low blood sugar readings due to poor circulation in his hands.  Recommend checking blood sugar in other areas or warming his hands and fingertips prior to checking his blood sugar.

## 2021-06-28 NOTE — ED Triage Notes (Signed)
Pt arrives via [facility] from [facility] for c/o hypoglycemia. EMS reports pt initially had CBG of 44 with SNF staff. Pt given oral glucose. EMS CBG 68 on arrival. Pt A+Ox4 on arrival, states that he has had little to no appetite for approximately two weeks.   EMS last VS 102/64, HR 64, 98% on RA, RR 18, CBG 68.  Initial CBG on arrival  reads LOW

## 2021-06-28 NOTE — ED Notes (Signed)
Patient verbalizes understanding of discharge instructions. Opportunity for questioning and answers were provided. Armband removed by staff, pt discharged from ED via [place] to [place].

## 2021-06-30 ENCOUNTER — Inpatient Hospital Stay (HOSPITAL_COMMUNITY): Payer: BC Managed Care – PPO

## 2021-06-30 ENCOUNTER — Encounter (HOSPITAL_COMMUNITY): Payer: Self-pay | Admitting: Emergency Medicine

## 2021-06-30 ENCOUNTER — Emergency Department (HOSPITAL_COMMUNITY): Payer: BC Managed Care – PPO

## 2021-06-30 ENCOUNTER — Inpatient Hospital Stay (HOSPITAL_COMMUNITY)
Admission: EM | Admit: 2021-06-30 | Discharge: 2021-07-05 | DRG: 871 | Disposition: A | Discharge status: Hospice | Payer: BC Managed Care – PPO | Source: Skilled Nursing Facility | Attending: Internal Medicine | Admitting: Internal Medicine

## 2021-06-30 ENCOUNTER — Other Ambulatory Visit: Payer: Self-pay

## 2021-06-30 DIAGNOSIS — I2694 Multiple subsegmental pulmonary emboli without acute cor pulmonale: Secondary | ICD-10-CM | POA: Diagnosis not present

## 2021-06-30 DIAGNOSIS — Z515 Encounter for palliative care: Secondary | ICD-10-CM

## 2021-06-30 DIAGNOSIS — J4530 Mild persistent asthma, uncomplicated: Secondary | ICD-10-CM | POA: Diagnosis not present

## 2021-06-30 DIAGNOSIS — K219 Gastro-esophageal reflux disease without esophagitis: Secondary | ICD-10-CM | POA: Diagnosis present

## 2021-06-30 DIAGNOSIS — I10 Essential (primary) hypertension: Secondary | ICD-10-CM | POA: Diagnosis present

## 2021-06-30 DIAGNOSIS — I5022 Chronic systolic (congestive) heart failure: Secondary | ICD-10-CM | POA: Diagnosis not present

## 2021-06-30 DIAGNOSIS — R54 Age-related physical debility: Secondary | ICD-10-CM | POA: Diagnosis present

## 2021-06-30 DIAGNOSIS — A419 Sepsis, unspecified organism: Secondary | ICD-10-CM | POA: Diagnosis not present

## 2021-06-30 DIAGNOSIS — I739 Peripheral vascular disease, unspecified: Secondary | ICD-10-CM | POA: Diagnosis not present

## 2021-06-30 DIAGNOSIS — J9600 Acute respiratory failure, unspecified whether with hypoxia or hypercapnia: Secondary | ICD-10-CM | POA: Diagnosis not present

## 2021-06-30 DIAGNOSIS — R630 Anorexia: Secondary | ICD-10-CM | POA: Diagnosis present

## 2021-06-30 DIAGNOSIS — I5020 Unspecified systolic (congestive) heart failure: Secondary | ICD-10-CM | POA: Diagnosis present

## 2021-06-30 DIAGNOSIS — I2699 Other pulmonary embolism without acute cor pulmonale: Secondary | ICD-10-CM | POA: Diagnosis not present

## 2021-06-30 DIAGNOSIS — I7300 Raynaud's syndrome without gangrene: Secondary | ICD-10-CM | POA: Diagnosis present

## 2021-06-30 DIAGNOSIS — Z89412 Acquired absence of left great toe: Secondary | ICD-10-CM

## 2021-06-30 DIAGNOSIS — Z79899 Other long term (current) drug therapy: Secondary | ICD-10-CM

## 2021-06-30 DIAGNOSIS — E161 Other hypoglycemia: Secondary | ICD-10-CM | POA: Diagnosis not present

## 2021-06-30 DIAGNOSIS — L89322 Pressure ulcer of left buttock, stage 2: Secondary | ICD-10-CM | POA: Diagnosis present

## 2021-06-30 DIAGNOSIS — L89312 Pressure ulcer of right buttock, stage 2: Secondary | ICD-10-CM | POA: Diagnosis present

## 2021-06-30 DIAGNOSIS — I471 Supraventricular tachycardia: Secondary | ICD-10-CM | POA: Diagnosis present

## 2021-06-30 DIAGNOSIS — R627 Adult failure to thrive: Secondary | ICD-10-CM | POA: Diagnosis not present

## 2021-06-30 DIAGNOSIS — E11649 Type 2 diabetes mellitus with hypoglycemia without coma: Secondary | ICD-10-CM | POA: Diagnosis present

## 2021-06-30 DIAGNOSIS — Z89512 Acquired absence of left leg below knee: Secondary | ICD-10-CM

## 2021-06-30 DIAGNOSIS — I517 Cardiomegaly: Secondary | ICD-10-CM | POA: Diagnosis not present

## 2021-06-30 DIAGNOSIS — R0602 Shortness of breath: Secondary | ICD-10-CM | POA: Diagnosis not present

## 2021-06-30 DIAGNOSIS — R8271 Bacteriuria: Secondary | ICD-10-CM | POA: Diagnosis present

## 2021-06-30 DIAGNOSIS — R0902 Hypoxemia: Secondary | ICD-10-CM

## 2021-06-30 DIAGNOSIS — J690 Pneumonitis due to inhalation of food and vomit: Secondary | ICD-10-CM | POA: Diagnosis present

## 2021-06-30 DIAGNOSIS — L89152 Pressure ulcer of sacral region, stage 2: Secondary | ICD-10-CM | POA: Diagnosis present

## 2021-06-30 DIAGNOSIS — Z89421 Acquired absence of other right toe(s): Secondary | ICD-10-CM

## 2021-06-30 DIAGNOSIS — E162 Hypoglycemia, unspecified: Secondary | ICD-10-CM

## 2021-06-30 DIAGNOSIS — J449 Chronic obstructive pulmonary disease, unspecified: Secondary | ICD-10-CM | POA: Diagnosis present

## 2021-06-30 DIAGNOSIS — Z20822 Contact with and (suspected) exposure to covid-19: Secondary | ICD-10-CM | POA: Diagnosis present

## 2021-06-30 DIAGNOSIS — Z91013 Allergy to seafood: Secondary | ICD-10-CM

## 2021-06-30 DIAGNOSIS — Z66 Do not resuscitate: Secondary | ICD-10-CM | POA: Diagnosis not present

## 2021-06-30 DIAGNOSIS — F4321 Adjustment disorder with depressed mood: Secondary | ICD-10-CM

## 2021-06-30 DIAGNOSIS — R4182 Altered mental status, unspecified: Secondary | ICD-10-CM | POA: Diagnosis not present

## 2021-06-30 DIAGNOSIS — I110 Hypertensive heart disease with heart failure: Secondary | ICD-10-CM | POA: Diagnosis present

## 2021-06-30 DIAGNOSIS — E1151 Type 2 diabetes mellitus with diabetic peripheral angiopathy without gangrene: Secondary | ICD-10-CM | POA: Diagnosis present

## 2021-06-30 DIAGNOSIS — Z7951 Long term (current) use of inhaled steroids: Secondary | ICD-10-CM

## 2021-06-30 DIAGNOSIS — Z9981 Dependence on supplemental oxygen: Secondary | ICD-10-CM | POA: Diagnosis not present

## 2021-06-30 DIAGNOSIS — Z7189 Other specified counseling: Secondary | ICD-10-CM

## 2021-06-30 DIAGNOSIS — I240 Acute coronary thrombosis not resulting in myocardial infarction: Secondary | ICD-10-CM | POA: Diagnosis not present

## 2021-06-30 DIAGNOSIS — J9601 Acute respiratory failure with hypoxia: Secondary | ICD-10-CM | POA: Diagnosis not present

## 2021-06-30 DIAGNOSIS — Z6821 Body mass index (BMI) 21.0-21.9, adult: Secondary | ICD-10-CM

## 2021-06-30 DIAGNOSIS — R64 Cachexia: Secondary | ICD-10-CM | POA: Diagnosis not present

## 2021-06-30 DIAGNOSIS — Z8701 Personal history of pneumonia (recurrent): Secondary | ICD-10-CM

## 2021-06-30 DIAGNOSIS — Z87892 Personal history of anaphylaxis: Secondary | ICD-10-CM

## 2021-06-30 DIAGNOSIS — I509 Heart failure, unspecified: Secondary | ICD-10-CM | POA: Diagnosis not present

## 2021-06-30 DIAGNOSIS — M109 Gout, unspecified: Secondary | ICD-10-CM | POA: Diagnosis present

## 2021-06-30 DIAGNOSIS — R9431 Abnormal electrocardiogram [ECG] [EKG]: Secondary | ICD-10-CM | POA: Diagnosis not present

## 2021-06-30 DIAGNOSIS — J90 Pleural effusion, not elsewhere classified: Secondary | ICD-10-CM | POA: Diagnosis not present

## 2021-06-30 DIAGNOSIS — Z89422 Acquired absence of other left toe(s): Secondary | ICD-10-CM

## 2021-06-30 DIAGNOSIS — N400 Enlarged prostate without lower urinary tract symptoms: Secondary | ICD-10-CM | POA: Diagnosis present

## 2021-06-30 DIAGNOSIS — I5084 End stage heart failure: Secondary | ICD-10-CM | POA: Diagnosis present

## 2021-06-30 DIAGNOSIS — E739 Lactose intolerance, unspecified: Secondary | ICD-10-CM | POA: Diagnosis present

## 2021-06-30 DIAGNOSIS — J42 Unspecified chronic bronchitis: Secondary | ICD-10-CM | POA: Diagnosis present

## 2021-06-30 DIAGNOSIS — M1990 Unspecified osteoarthritis, unspecified site: Secondary | ICD-10-CM | POA: Diagnosis present

## 2021-06-30 DIAGNOSIS — Z882 Allergy status to sulfonamides status: Secondary | ICD-10-CM

## 2021-06-30 DIAGNOSIS — Z881 Allergy status to other antibiotic agents status: Secondary | ICD-10-CM

## 2021-06-30 DIAGNOSIS — I70202 Unspecified atherosclerosis of native arteries of extremities, left leg: Secondary | ICD-10-CM | POA: Diagnosis not present

## 2021-06-30 DIAGNOSIS — B965 Pseudomonas (aeruginosa) (mallei) (pseudomallei) as the cause of diseases classified elsewhere: Secondary | ICD-10-CM | POA: Diagnosis present

## 2021-06-30 LAB — I-STAT ARTERIAL BLOOD GAS, ED
Acid-Base Excess: 0 mmol/L (ref 0.0–2.0)
Bicarbonate: 22.5 mmol/L (ref 20.0–28.0)
Calcium, Ion: 1.2 mmol/L (ref 1.15–1.40)
HCT: 44 % (ref 39.0–52.0)
Hemoglobin: 15 g/dL (ref 13.0–17.0)
O2 Saturation: 100 %
Patient temperature: 97.9
Potassium: 4.4 mmol/L (ref 3.5–5.1)
Sodium: 136 mmol/L (ref 135–145)
TCO2: 23 mmol/L (ref 22–32)
pCO2 arterial: 31 mmHg — ABNORMAL LOW (ref 32.0–48.0)
pH, Arterial: 7.467 — ABNORMAL HIGH (ref 7.350–7.450)
pO2, Arterial: 362 mmHg — ABNORMAL HIGH (ref 83.0–108.0)

## 2021-06-30 LAB — COMPREHENSIVE METABOLIC PANEL
ALT: 10 U/L (ref 0–44)
AST: 28 U/L (ref 15–41)
Albumin: 2.4 g/dL — ABNORMAL LOW (ref 3.5–5.0)
Alkaline Phosphatase: 189 U/L — ABNORMAL HIGH (ref 38–126)
Anion gap: 13 (ref 5–15)
BUN: 56 mg/dL — ABNORMAL HIGH (ref 8–23)
CO2: 19 mmol/L — ABNORMAL LOW (ref 22–32)
Calcium: 8.8 mg/dL — ABNORMAL LOW (ref 8.9–10.3)
Chloride: 102 mmol/L (ref 98–111)
Creatinine, Ser: 1.23 mg/dL (ref 0.61–1.24)
GFR, Estimated: >60 mL/min (ref >60)
Glucose, Bld: 102 mg/dL — ABNORMAL HIGH (ref 70–99)
Potassium: 4.4 mmol/L (ref 3.5–5.1)
Sodium: 134 mmol/L — ABNORMAL LOW (ref 135–145)
Total Bilirubin: 1.6 mg/dL — ABNORMAL HIGH (ref 0.3–1.2)
Total Protein: 7.6 g/dL (ref 6.5–8.1)

## 2021-06-30 LAB — I-STAT VENOUS BLOOD GAS, ED
Acid-base deficit: 4 mmol/L — ABNORMAL HIGH (ref 0.0–2.0)
Bicarbonate: 21 mmol/L (ref 20.0–28.0)
Calcium, Ion: 1 mmol/L — ABNORMAL LOW (ref 1.15–1.40)
HCT: 49 % (ref 39.0–52.0)
Hemoglobin: 16.7 g/dL (ref 13.0–17.0)
O2 Saturation: 54 %
Potassium: 4.3 mmol/L (ref 3.5–5.1)
Sodium: 136 mmol/L (ref 135–145)
TCO2: 22 mmol/L (ref 22–32)
pCO2, Ven: 37.1 mmHg — ABNORMAL LOW (ref 44.0–60.0)
pH, Ven: 7.361 (ref 7.250–7.430)
pO2, Ven: 29 mmHg — CL (ref 32.0–45.0)

## 2021-06-30 LAB — CBC WITH DIFFERENTIAL/PLATELET
Abs Immature Granulocytes: 0.05 10*3/uL (ref 0.00–0.07)
Basophils Absolute: 0 10*3/uL (ref 0.0–0.1)
Basophils Relative: 0 %
Eosinophils Absolute: 0 10*3/uL (ref 0.0–0.5)
Eosinophils Relative: 0 %
HCT: 48.5 % (ref 39.0–52.0)
Hemoglobin: 14.6 g/dL (ref 13.0–17.0)
Immature Granulocytes: 1 %
Lymphocytes Relative: 21 %
Lymphs Abs: 1.1 10*3/uL (ref 0.7–4.0)
MCH: 27.9 pg (ref 26.0–34.0)
MCHC: 30.1 g/dL (ref 30.0–36.0)
MCV: 92.7 fL (ref 80.0–100.0)
Monocytes Absolute: 0.3 10*3/uL (ref 0.1–1.0)
Monocytes Relative: 6 %
Neutro Abs: 3.7 10*3/uL (ref 1.7–7.7)
Neutrophils Relative %: 72 %
Platelets: 197 10*3/uL (ref 150–400)
RBC: 5.23 MIL/uL (ref 4.22–5.81)
RDW: 20.3 % — ABNORMAL HIGH (ref 11.5–15.5)
WBC: 5.1 10*3/uL (ref 4.0–10.5)
nRBC: 1.8 % — ABNORMAL HIGH (ref 0.0–0.2)

## 2021-06-30 LAB — CBG MONITORING, ED
Glucose-Capillary: 139 mg/dL — ABNORMAL HIGH (ref 70–99)
Glucose-Capillary: <10 mg/dL — CL (ref 70–99)
Glucose-Capillary: 112 mg/dL — ABNORMAL HIGH (ref 70–99)
Glucose-Capillary: 133 mg/dL — ABNORMAL HIGH (ref 70–99)
Glucose-Capillary: 120 mg/dL — ABNORMAL HIGH (ref 70–99)
Glucose-Capillary: 101 mg/dL — ABNORMAL HIGH (ref 70–99)
Glucose-Capillary: 38 mg/dL — CL (ref 70–99)

## 2021-06-30 LAB — APTT: aPTT: 29 seconds (ref 24–36)

## 2021-06-30 LAB — URINALYSIS, ROUTINE W REFLEX MICROSCOPIC
Bilirubin Urine: NEGATIVE
Glucose, UA: NEGATIVE mg/dL
Ketones, ur: NEGATIVE mg/dL
Nitrite: NEGATIVE
Protein, ur: 100 mg/dL — AB
RBC / HPF: >50 RBC/hpf — ABNORMAL HIGH (ref 0–5)
Specific Gravity, Urine: 1.029 (ref 1.005–1.030)
WBC, UA: >50 WBC/hpf — ABNORMAL HIGH (ref 0–5)
pH: 5 (ref 5.0–8.0)

## 2021-06-30 LAB — GLUCOSE, CAPILLARY
Glucose-Capillary: 114 mg/dL — ABNORMAL HIGH (ref 70–99)
Glucose-Capillary: 62 mg/dL — ABNORMAL LOW (ref 70–99)
Glucose-Capillary: 69 mg/dL — ABNORMAL LOW (ref 70–99)

## 2021-06-30 LAB — RESP PANEL BY RT-PCR (FLU A&B, COVID) ARPGX2
Influenza A by PCR: NEGATIVE
Influenza B by PCR: NEGATIVE
SARS Coronavirus 2 by RT PCR: NEGATIVE

## 2021-06-30 LAB — BRAIN NATRIURETIC PEPTIDE: B Natriuretic Peptide: >4500 pg/mL — ABNORMAL HIGH (ref 0.0–100.0)

## 2021-06-30 LAB — PROTIME-INR
INR: 1.9 — ABNORMAL HIGH (ref 0.8–1.2)
Prothrombin Time: 21.4 seconds — ABNORMAL HIGH (ref 11.4–15.2)

## 2021-06-30 LAB — LACTIC ACID, PLASMA
Lactic Acid, Venous: 4.9 mmol/L (ref 0.5–1.9)
Lactic Acid, Venous: 6.2 mmol/L (ref 0.5–1.9)

## 2021-06-30 LAB — URINE CULTURE: Culture: >=100000 — AB

## 2021-06-30 LAB — MAGNESIUM: Magnesium: 2.2 mg/dL (ref 1.7–2.4)

## 2021-06-30 LAB — LIPASE, BLOOD: Lipase: 44 U/L (ref 11–51)

## 2021-06-30 LAB — TROPONIN I (HIGH SENSITIVITY)
Troponin I (High Sensitivity): 24 ng/L — ABNORMAL HIGH (ref <18)
Troponin I (High Sensitivity): 20 ng/L — ABNORMAL HIGH (ref <18)

## 2021-06-30 MED ORDER — PANTOPRAZOLE SODIUM 40 MG PO TBEC
40.0000 mg | DELAYED_RELEASE_TABLET | Freq: Two times a day (BID) | ORAL | Status: DC
  Administered 2021-06-30 – 2021-07-04 (×4): 40 mg via ORAL
  Filled 2021-06-30 (×7): qty 1

## 2021-06-30 MED ORDER — MONTELUKAST SODIUM 10 MG PO TABS
10.0000 mg | ORAL_TABLET | Freq: Every day | ORAL | Status: DC
  Administered 2021-06-30: 10 mg via ORAL
  Filled 2021-06-30 (×3): qty 1

## 2021-06-30 MED ORDER — SODIUM CHLORIDE 0.9 % IV SOLN
2.0000 g | Freq: Once | INTRAVENOUS | Status: AC
  Administered 2021-06-30: 2 g via INTRAVENOUS
  Filled 2021-06-30: qty 2

## 2021-06-30 MED ORDER — LOPERAMIDE HCL 2 MG PO CAPS: 2.0000 mg | ORAL_CAPSULE | Freq: Four times a day (QID) | ORAL | Status: DC | PRN

## 2021-06-30 MED ORDER — LACTATED RINGERS IV SOLN: INTRAVENOUS | Status: DC

## 2021-06-30 MED ORDER — ONDANSETRON HCL 4 MG/2ML IJ SOLN: 4.0000 mg | Freq: Four times a day (QID) | INTRAMUSCULAR | Status: DC | PRN

## 2021-06-30 MED ORDER — METRONIDAZOLE 500 MG/100ML IV SOLN
500.0000 mg | Freq: Once | INTRAVENOUS | Status: AC
  Administered 2021-06-30: 500 mg via INTRAVENOUS
  Filled 2021-06-30: qty 100

## 2021-06-30 MED ORDER — GLUCERNA SHAKE PO LIQD: 237.0000 mL | Freq: Three times a day (TID) | ORAL | Status: DC

## 2021-06-30 MED ORDER — ASCORBIC ACID 500 MG PO TABS
1000.0000 mg | ORAL_TABLET | Freq: Every day | ORAL | Status: DC
  Administered 2021-07-02 – 2021-07-03 (×2): 1000 mg via ORAL
  Filled 2021-06-30 (×2): qty 2

## 2021-06-30 MED ORDER — ACETAMINOPHEN 650 MG RE SUPP: 650.0000 mg | Freq: Four times a day (QID) | RECTAL | Status: DC | PRN

## 2021-06-30 MED ORDER — METRONIDAZOLE 500 MG/100ML IV SOLN: 500.0000 mg | Freq: Two times a day (BID) | INTRAVENOUS | Status: DC

## 2021-06-30 MED ORDER — DEXTROSE 50 % IV SOLN
12.5000 g | INTRAVENOUS | Status: AC
  Administered 2021-07-01: 12.5 g via INTRAVENOUS

## 2021-06-30 MED ORDER — CHLORHEXIDINE GLUCONATE CLOTH 2 % EX PADS
6.0000 | MEDICATED_PAD | Freq: Every day | CUTANEOUS | Status: DC
  Administered 2021-07-01 – 2021-07-05 (×5): 6 via TOPICAL

## 2021-06-30 MED ORDER — HEPARIN (PORCINE) 25000 UT/250ML-% IV SOLN
1300.0000 [IU]/h | INTRAVENOUS | Status: DC
  Administered 2021-06-30: 1550 [IU]/h via INTRAVENOUS
  Administered 2021-07-01: 1300 [IU]/h via INTRAVENOUS
  Filled 2021-06-30 (×2): qty 250

## 2021-06-30 MED ORDER — LACTATED RINGERS IV BOLUS (SEPSIS): 1000.0000 mL | Freq: Once | INTRAVENOUS | Status: DC

## 2021-06-30 MED ORDER — DEXTROSE 50 % IV SOLN
25.0000 g | Freq: Once | INTRAVENOUS | Status: AC
  Administered 2021-06-30: 25 g via INTRAVENOUS

## 2021-06-30 MED ORDER — MOMETASONE FURO-FORMOTEROL FUM 200-5 MCG/ACT IN AERO
2.0000 | INHALATION_SPRAY | Freq: Two times a day (BID) | RESPIRATORY_TRACT | Status: DC
  Filled 2021-06-30: qty 8.8

## 2021-06-30 MED ORDER — TAMSULOSIN HCL 0.4 MG PO CAPS
0.4000 mg | ORAL_CAPSULE | Freq: Every day | ORAL | Status: DC
  Administered 2021-07-02: 0.4 mg via ORAL
  Filled 2021-06-30 (×2): qty 1

## 2021-06-30 MED ORDER — FLUTICASONE PROPIONATE 50 MCG/ACT NA SUSP
1.0000 | Freq: Every day | NASAL | Status: DC
  Administered 2021-07-01 – 2021-07-04 (×4): 1 via NASAL
  Filled 2021-06-30: qty 16

## 2021-06-30 MED ORDER — SODIUM CHLORIDE 0.9 % IV SOLN
2.0000 g | Freq: Three times a day (TID) | INTRAVENOUS | Status: DC
  Administered 2021-06-30 – 2021-07-03 (×8): 2 g via INTRAVENOUS
  Filled 2021-06-30 (×8): qty 2

## 2021-06-30 MED ORDER — ONDANSETRON HCL 4 MG PO TABS: 4.0000 mg | ORAL_TABLET | Freq: Four times a day (QID) | ORAL | Status: DC | PRN

## 2021-06-30 MED ORDER — HYDROCODONE-ACETAMINOPHEN 5-325 MG PO TABS: 1.0000 | ORAL_TABLET | Freq: Four times a day (QID) | ORAL | Status: DC | PRN

## 2021-06-30 MED ORDER — ALBUTEROL SULFATE (2.5 MG/3ML) 0.083% IN NEBU: 3.0000 mL | INHALATION_SOLUTION | RESPIRATORY_TRACT | Status: DC | PRN

## 2021-06-30 MED ORDER — DEXTROSE 50 % IV SOLN
INTRAVENOUS | Status: AC
  Filled 2021-06-30: qty 50

## 2021-06-30 MED ORDER — ACETAMINOPHEN 325 MG PO TABS: 650.0000 mg | ORAL_TABLET | Freq: Four times a day (QID) | ORAL | Status: DC | PRN

## 2021-06-30 MED ORDER — IOHEXOL 350 MG/ML SOLN
75.0000 mL | Freq: Once | INTRAVENOUS | Status: AC | PRN
  Administered 2021-06-30: 75 mL via INTRAVENOUS

## 2021-06-30 MED ORDER — AZELASTINE HCL 0.1 % NA SOLN
1.0000 | Freq: Two times a day (BID) | NASAL | Status: DC | PRN
  Filled 2021-06-30: qty 30

## 2021-06-30 MED ORDER — ADULT MULTIVITAMIN W/MINERALS CH
1.0000 | ORAL_TABLET | Freq: Every day | ORAL | Status: DC
  Administered 2021-07-02 – 2021-07-03 (×2): 1 via ORAL
  Filled 2021-06-30 (×2): qty 1

## 2021-06-30 MED ORDER — VANCOMYCIN HCL 1000 MG/200ML IV SOLN
1000.0000 mg | Freq: Two times a day (BID) | INTRAVENOUS | Status: DC
Start: 2021-07-01 — End: 2021-07-01
  Administered 2021-07-01: 1000 mg via INTRAVENOUS
  Filled 2021-06-30 (×2): qty 200

## 2021-06-30 MED ORDER — LACTATED RINGERS IV BOLUS (SEPSIS)
1000.0000 mL | Freq: Once | INTRAVENOUS | Status: AC
  Administered 2021-06-30: 1000 mL via INTRAVENOUS

## 2021-06-30 MED ORDER — HEPARIN BOLUS VIA INFUSION
6500.0000 [IU] | Freq: Once | INTRAVENOUS | Status: AC
  Administered 2021-06-30: 6500 [IU] via INTRAVENOUS
  Filled 2021-06-30: qty 6500

## 2021-06-30 MED ORDER — MIRTAZAPINE 15 MG PO TABS
15.0000 mg | ORAL_TABLET | Freq: Every day | ORAL | Status: DC
  Administered 2021-06-30: 15 mg via ORAL
  Filled 2021-06-30 (×3): qty 1

## 2021-06-30 MED ORDER — PROSOURCE PLUS PO LIQD
30.0000 mL | Freq: Three times a day (TID) | ORAL | Status: DC
  Administered 2021-07-02: 30 mL via ORAL
  Filled 2021-06-30 (×5): qty 30

## 2021-06-30 MED ORDER — DEXTROSE IN LACTATED RINGERS 5 % IV SOLN
INTRAVENOUS | Status: DC
Start: 2021-06-30 — End: 2021-07-01

## 2021-06-30 MED ORDER — VANCOMYCIN HCL 1750 MG/350ML IV SOLN
1750.0000 mg | Freq: Once | INTRAVENOUS | Status: AC
  Administered 2021-06-30: 1750 mg via INTRAVENOUS
  Filled 2021-06-30: qty 350

## 2021-06-30 NOTE — ED Notes (Addendum)
7471 CBG was done on pts finger which was inconsistent with EMS CBG and our repeat on ear.

## 2021-06-30 NOTE — ED Notes (Signed)
Dr.Ray, RT and Pharmacist at bedside. Pt O2 at 66% on RA. Nonrebreather initiated at 80-82%.

## 2021-06-30 NOTE — ED Provider Notes (Signed)
Emergency Medicine Provider Triage Evaluation Note  Stephen Whitaker , a 65 y.o. male  was evaluated in triage.  Pt complains of sent from facility for hypoglycemia.  Same thing happened 2 days ago.  Reports decreased appetite.  Says he has a history of borderline diabetes however he is unsure what medications he takes at his facility.  Review of Systems  Positive: Weakness, fatigue Negative: Shortness of breath, dizziness  Physical Exam  BP 103/86 (BP Location: Right Arm)   Pulse (!) 101   Temp 98.1 F (36.7 C) (Oral)   Resp 18   SpO2 93%  Gen:   Awake, no distress   Resp:  Normal effort  MSK:   Moves extremities without difficulty  Other:  Ill-appearing.  Jaundiced.    Medical Decision Making  Medically screening exam initiated at 10:01 AM.  Appropriate orders placed.  Stephen Whitaker was informed that the remainder of the evaluation will be completed by another provider, this initial triage assessment does not replace that evaluation, and the importance of remaining in the ED until their evaluation is complete.     Rhae Hammock, PA-C 06/30/21 1004    Charlesetta Shanks, MD 07/01/21 [phone]

## 2021-06-30 NOTE — Progress Notes (Signed)
ANTICOAGULATION CONSULT NOTE - Initial Consult  Pharmacy Consult for heparin Indication: pulmonary embolus  Allergies  Allergen Reactions   Ciprofloxacin Rash and Other (See Comments)    SYNCOPE    Lactose Intolerance (Gi) Diarrhea   Shellfish Allergy Anaphylaxis   Sulfa Antibiotics Other (See Comments)    SYNCOPE DIZZINESS    American Cockroach    Cat Hair Extract    Dust Mite Extract    Molds & Smuts     Patient Measurements: Weight: 90.7 kg (199 lb 15.3 oz) Heparin Dosing Weight: 90.7 kg  Vital Signs: Temp: 99 F (37.2 C) (11/11 1619) Temp Source: Rectal (11/11 1619) BP: 118/93 (11/11 1735) Pulse Rate: 100 (11/11 1735)  Labs: Recent Labs    06/28/21 0907 06/28/21 0925 06/30/21 1002 06/30/21 1004 06/30/21 1430 06/30/21 1433 06/30/21 1443  HGB 15.4 18.7* 14.6  --  15.0  --  16.7  HCT 50.3 55.0* 48.5  --  44.0  --  49.0  PLT 258  --  197  --   --   --   --   APTT  --   --   --   --   --  29  --   LABPROT  --   --   --   --   --  21.4*  --   INR  --   --   --   --   --  1.9*  --   CREATININE 1.35* 1.20  --  1.23  --   --   --     Estimated Creatinine Clearance: 68.7 mL/min (by C-G formula based on SCr of 1.23 mg/dL).   Medical History: Past Medical History:  Diagnosis Date   Abnormal liver function tests    Arthritis    Boil of buttock    COPD (chronic obstructive pulmonary disease) (HCC)    Chronic Bronchitis.Advair inhaler    Dyspnea    GERD (gastroesophageal reflux disease)    takes Protonix daily   Gout    takes Allopurinol daily   Hemorrhoids    Hypertension    takes HCTZ daily   Hypomagnesemia    Leg ulcer (HCC)    Mild mitral regurgitation    Pancreatitis    10 years ago   Pneumonia 2016   Tricuspid regurgitation    Tubular adenoma of colon     Medications: see MAR  Assessment: 65 yo M with acute bilateral PE extending to all 5 lobes. No RHS noted per radiology read. CBC wnl. No AC PTA.   Goal of Therapy:  Heparin level 0.3-0.7  units/ml Monitor platelets by anticoagulation protocol: Yes   Plan:  Give 6500 units bolus x 1 Start heparin infusion at 1550 units/hr Check anti-Xa level in 6 hours and daily while on heparin Continue to monitor H&H and platelets  Joetta Manners, PharmD, [facility] Emergency Medicine Clinical Pharmacist ED RPh Phone: [place]: [phone]

## 2021-06-30 NOTE — ED Triage Notes (Signed)
Pt via EMS from [place] for hypoglycemia to 72s. Staff reports he is not wanting to eat. Given oral glucose with improvement to 62. A/O x 4.

## 2021-06-30 NOTE — ED Provider Notes (Signed)
[facility] EMERGENCY DEPARTMENT Provider Note   CSN: [id number] Arrival date & time: 06/30/21  6834     History Chief Complaint  Patient presents with   Hypoglycemia    Dorrance MONT JAGODA is a 65 y.o. male.  With past medical history of GERD, hypertension, COPD, HFrEF with last EF 20 to 25%, recent left BKA 04/2021 who presents emergency department hypoglycemia.  Patient arrived via EMS from [place] for hypoglycemia in the 20s.  EMS gave oral glucose with improvement of 62.  Initial here less than 10.  After intervention sugar to 139 and stable.  Staff at nursing facility states that he has not been wanting to eat.  Per the patient he states that he has not eaten in 2 to 3 weeks.  He states that he does not have any appetite and is intermittently nauseated.  The only nutrition he endorses taking his supplemental drinks.  He denies any vomiting, abdominal pain or diarrhea.  He denies any known fevers.  He denies any new cough, shortness of breath, dysuria.  He is alert and oriented on my exam.  He has a full catheter in place and is unsure why.  Per the note from the ED on 06/28/21 he had the catheter in place then.  On chart review he was here 2 days ago on 06/28/2021 for same.  He received D50 and monitored with serial CBGs in the 100s.  His mental status was stable.  He did have urinalysis with leukocytes and given 3g fosfomycin.  He was discharged back to his facility.  It is additionally noted that his last hospitalization he had difficulty with hypoglycemia.  He had a extensive work-up including C-peptide levels and insulin levels that were all unremarkable.  Hypoglycemia Associated symptoms: no shortness of breath and no vomiting       Past Medical History:  Diagnosis Date   Abnormal liver function tests    Arthritis    Boil of buttock    COPD (chronic obstructive pulmonary disease) (HCC)    Chronic Bronchitis.Advair inhaler    Dyspnea    GERD  (gastroesophageal reflux disease)    takes Protonix daily   Gout    takes Allopurinol daily   Hemorrhoids    Hypertension    takes HCTZ daily   Hypomagnesemia    Leg ulcer (HCC)    Mild mitral regurgitation    Pancreatitis    10 years ago   Pneumonia 2016   Tricuspid regurgitation    Tubular adenoma of colon     Patient Active Problem List   Diagnosis Date Noted   Urinary catheter in place 05/31/2021   Below-knee amputation of left lower extremity (JAARS) 05/19/2021   Seasonal and perennial allergic rhinoconjunctivitis 05/08/2021   Oral thrush 05/08/2021   Pressure injury of skin 04/17/2021   Protein-calorie malnutrition, severe 04/17/2021   Osteomyelitis of left foot ([place]) 04/16/2021   Diabetic foot infection ([place])    Fatigue 02/22/2021   Nonadherence to medication 02/15/2021   Asthma 01/30/2021   Other adverse food reactions, not elsewhere classified, subsequent encounter 01/30/2021   LVH (left ventricular hypertrophy) 01/04/2021   Acute on chronic systolic heart failure ([place]) 12/08/2020   HFrEF (heart failure with reduced ejection fraction) ([place]) 10/14/2020   Nonspecific abnormal electrocardiogram (ECG) (EKG) 10/14/2020   Atrial tachycardia ([place]) 10/14/2020   Non-pressure chronic ulcer of other part of left foot limited to breakdown of skin ([place]) 10/12/2020   Leg swelling 10/06/2020   DOE (  dyspnea on exertion) 10/06/2020   Thrombocytopenia ([place]) 06/26/2019   Acquired absence of other right toe(s) ([place]) 02/19/2018   Boil of buttock 10/24/2016   Amputated toe of left foot ([place]) 10/19/2016   Amputated toe, right ([place]) 06/13/2016   Chronic bronchitis ([place]) 11/24/2015   Other pancytopenia ([place]) 01/24/2015   Chronic pancreatitis ([place]) 01/24/2015   Blood poisoning    Neutropenia ([place])    Iron deficiency anemia due to chronic blood loss    Leukopenia    Sepsis ([place]) 01/21/2015   Leucopenia 01/21/2015   Anemia 01/21/2015   Syncope 01/21/2015   Diarrhea 01/21/2015    Hypokalemia 01/21/2015   Loss of consciousness ([place])    Eczema 10/21/2014   Essential hypertension 10/21/2014   Vasovagal syncope 10/21/2014   Other chronic pancreatitis ([place]) 08/10/2014   COLD (chronic obstructive lung disease) ([place]) 08/10/2014   Chronic ethmoidal sinusitis 08/10/2014   Bronchitis 10/17/2010   PLANTAR FASCIITIS, LEFT 10/17/2010   OTHER NEUTROPENIA 04/21/2010   CALLUS, TOE 03/15/2010   LEUKOPENIA, MILD 02/20/2010   LIVER FUNCTION TESTS, ABNORMAL, HX OF 02/16/2010   DENTAL CARIES 02/15/2010   ONYCHOMYCOSIS 07/05/2009   ALLERGIC URTICARIA 07/05/2009   PAIN IN LIMB 07/05/2009   ERECTILE DYSFUNCTION 05/25/2008   INFLUENZA 05/25/2008   VIRAL INFECTION 10/01/2007   HSV 05/02/2007   CONDYLOMA ACUMINATUM 05/02/2007   HYPERTENSION 05/02/2007   Chronic asthma, mild persistent, uncomplicated 46/56/8127   Gastroesophageal reflux disease without esophagitis 05/02/2007   PANCREATITIS 05/02/2007    Past Surgical History:  Procedure Laterality Date   AMPUTATION Left 07/14/2014   Procedure: 2nd Toe Amputation;  Surgeon: [name], MD;  Location: [place];  Service: Orthopedics;  Laterality: Left;   AMPUTATION Left 11/16/2015   Procedure: Left Great Toe Amputation at Metatarsophalangeal Joint;  Surgeon: [name], MD;  Location: [place];  Service: Orthopedics;  Laterality: Left;   AMPUTATION Right 06/13/2016   Procedure: RIGHT 2nd Toe Amputation;  Surgeon: [name], MD;  Location: [place];  Service: Orthopedics;  Laterality: Right;   AMPUTATION Left 10/19/2016   Procedure: Amputation Left 3rd Toe;  Surgeon: [name], MD;  Location: [place];  Service: Orthopedics;  Laterality: Left;   AMPUTATION Left 04/21/2021   Procedure: LEFT BELOW KNEE AMPUTATION;  Surgeon: [name], MD;  Location: [place];  Service: Orthopedics;  Laterality: Left;   RIGHT/LEFT HEART CATH AND CORONARY ANGIOGRAPHY N/A 12/12/2020   Procedure: RIGHT/LEFT HEART CATH AND CORONARY ANGIOGRAPHY;  Surgeon:  Lorretta Harp, MD;  Location: [place] [facility];  Service: Cardiovascular;  Laterality: N/A;   TOE AMPUTATION Left 11/16/2015   left great toe    TOE AMPUTATION Right 06/13/2016   TONSILLECTOMY         Family History  Adopted: Yes  Problem Relation Age of Onset   Cancer Sister 60       unknown origin     Social History   Tobacco Use   Smoking status: Never   Smokeless tobacco: Never  Vaping Use   Vaping Use: Never used  Substance Use Topics   Alcohol use: Yes    Alcohol/week: 0.0 standard drinks    Comment: rarely   Drug use: No    Home Medications Prior to Admission medications   Medication Sig Start Date End Date Taking? Authorizing Provider  acetaminophen (TYLENOL) 325 MG tablet Take 2 tablets (650 mg total) by mouth every 6 (six) hours as needed for mild pain (or Fever >/= 101). 04/25/21  Nita Sells, MD  albuterol (VENTOLIN HFA) 108 (90 Base) MCG/ACT inhaler Inhale 2 puffs into the lungs every 4 (four) hours as needed for wheezing or shortness of breath. 10/06/20   Tanda Rockers, MD  ascorbic acid (VITAMIN C) 1000 MG tablet Take 1 tablet (1,000 mg total) by mouth daily. 05/03/21   Lavina Hamman, MD  azelastine (ASTELIN) 0.1 % nasal spray Place 1-2 sprays into both nostrils 2 (two) times daily as needed (nasal drainage). Use in each nostril as directed 01/30/21   Garnet Sierras, DO  budesonide-formoterol (SYMBICORT) 160-4.5 MCG/ACT inhaler USE 2 INHALATIONS TWICE A DAY 03/13/21   Tanda Rockers, MD  EPINEPHrine 0.3 mg/0.3 mL IJ SOAJ injection Inject 0.3 mg into the muscle as needed for anaphylaxis. 05/08/21   Garnet Sierras, DO  fluticasone (FLONASE) 50 MCG/ACT nasal spray Place 1 spray into both nostrils 2 (two) times daily as needed (nasal congestion). 05/08/21   Garnet Sierras, DO  furosemide (LASIX) 40 MG tablet Take 2 tablets (80 mg total) by mouth daily. 06/23/21   [place], Maricela Bo, FNP  HYDROcodone-acetaminophen (NORCO/VICODIN) 5-325 MG tablet Take 1-2 tablets  by mouth every 6 (six) hours as needed for moderate pain or severe pain. 04/26/21   Lavina Hamman, MD  lactase (LACTAID) 3000 units tablet Take 3 tablets (9,000 Units total) by mouth 3 (three) times daily with meals. 05/02/21   Lavina Hamman, MD  loperamide (IMODIUM) 2 MG capsule Take 1 capsule (2 mg total) by mouth as needed for diarrhea or loose stools. 05/02/21   Lavina Hamman, MD  metoprolol succinate (TOPROL XL) 25 MG 24 hr tablet Take 1 tablet (25 mg total) by mouth daily. 01/04/21   Chandrasekhar, Lyda Kalata A, MD  mirtazapine (REMERON) 7.5 MG tablet Take 7.5 mg by mouth at bedtime.    [provider]  MITIGARE 0.6 MG CAPS Take 1 capsule by mouth as needed. 02/02/21   [provider]  montelukast (SINGULAIR) 10 MG tablet Take 1 tablet (10 mg total) by mouth at bedtime. 05/08/21   Garnet Sierras, DO  Multiple Vitamin (MULTIVITAMIN WITH MINERALS) TABS tablet Take 1 tablet by mouth daily. 05/03/21   Lavina Hamman, MD  Nutritional Supplements (,FEEDING SUPPLEMENT, PROSOURCE PLUS) liquid Take 30 mLs by mouth 3 (three) times daily between meals. 05/02/21   Lavina Hamman, MD  Olopatadine HCl 0.2 % SOLN Apply 1 drop to eye daily as needed (itchy/watery eyes). 01/30/21   Garnet Sierras, DO  pantoprazole (PROTONIX) 40 MG tablet TAKE ONE TABLET BY MOUTH 30 TO 60 MINUTES BEFORE YOUR FIRST AND LAST MEALS OF THE DAY 01/25/21   Tanda Rockers, MD  polyethylene glycol (MIRALAX / GLYCOLAX) 17 g packet Take 17 g by mouth daily as needed for mild constipation. 04/25/21   Nita Sells, MD  Probiotic Product (SUPER PROBIOTIC DIGESTIVE) CAPS Take 1 tablet by mouth as needed (stomach upset). Take as needed with meals    [provider]  sacubitril-valsartan (ENTRESTO) 24-26 MG Take 1 tablet by mouth 2 (two) times daily. 01/24/21   Chandrasekhar, Mahesh A, MD  SPIRULINA PO Take 1 capsule by mouth 2 (two) times daily with a meal.    [provider]  tamsulosin (FLOMAX) 0.4 MG CAPS capsule  Take 1 capsule (0.4 mg total) by mouth daily after supper. 04/25/21   Nita Sells, MD  Trolamine Salicylate (BLUE-EMU HEMP EX) Apply 1 application topically daily as needed.    [provider]  zinc sulfate 220 (50 Zn) MG capsule Take 1 capsule (220 mg total) by mouth daily. 05/03/21   Lavina Hamman, MD    Allergies    Ciprofloxacin, Lactose intolerance (gi), Shellfish allergy, and Sulfa antibiotics  Review of Systems   Review of Systems  Constitutional:  Positive for activity change, appetite change and fatigue. Negative for fever.  Respiratory:  Negative for cough and shortness of breath.   Cardiovascular:  Negative for chest pain.  Gastrointestinal:  Negative for abdominal pain, diarrhea, nausea and vomiting.  Genitourinary:  Negative for dysuria.  All other systems reviewed and are negative.  Physical Exam Updated Vital Signs BP 97/83   Pulse (!) 101   Temp 97.9 F (36.6 C) (Oral)   Resp (!) 27   SpO2 (!) 83%   Physical Exam Vitals and nursing note reviewed.  Constitutional:      Appearance: He is ill-appearing.  HENT:     Head: Normocephalic and atraumatic.     Nose: Nose normal.     Mouth/Throat:     Mouth: Mucous membranes are dry.  Eyes:     General: No scleral icterus.    Extraocular Movements: Extraocular movements intact.     Pupils: Pupils are equal, round, and reactive to light.     Comments: Conjunctive a pale  Cardiovascular:     Rate and Rhythm: Regular rhythm. Tachycardia present.     Pulses: Normal pulses.     Heart sounds: No murmur heard. Pulmonary:     Effort: Pulmonary effort is normal. No respiratory distress.     Breath sounds: Decreased air movement present. Examination of the right-lower field reveals decreased breath sounds. Examination of the left-lower field reveals decreased breath sounds. Decreased breath sounds present. No wheezing or rhonchi.  Abdominal:     General: Bowel sounds are normal. There is no distension.      Palpations: Abdomen is soft.     Tenderness: There is no abdominal tenderness.  Musculoskeletal:        General: Normal range of motion.     Cervical back: Neck supple.     Right lower leg: 3+ Pitting Edema present.     Left Lower Extremity: Left leg is amputated below knee.  Skin:    General: Skin is warm and dry.     Capillary Refill: Capillary refill takes less than 2 seconds.  Neurological:     General: No focal deficit present.     Mental Status: He is alert and oriented to person, place, and time. Mental status is at baseline.  Psychiatric:        Mood and Affect: Mood normal.        Behavior: Behavior normal.        Thought Content: Thought content normal.        Judgment: Judgment normal.    ED Results / Procedures / Treatments   Labs (all labs ordered are listed, but only abnormal results are displayed) Labs Reviewed  CBC WITH DIFFERENTIAL/PLATELET - Abnormal; Notable for the following components:      Result Value   RDW 20.3 (*)    nRBC 1.8 (*)    All other components within normal limits  COMPREHENSIVE METABOLIC PANEL - Abnormal; Notable for the following components:   Sodium 134 (*)    CO2 19 (*)    Glucose, Bld 102 (*)    BUN 56 (*)    Calcium 8.8 (*)    Albumin 2.4 (*)  Alkaline Phosphatase 189 (*)    Total Bilirubin 1.6 (*)    All other components within normal limits  CBG MONITORING, ED - Abnormal; Notable for the following components:   Glucose-Capillary <10 (*)    All other components within normal limits  CBG MONITORING, ED - Abnormal; Notable for the following components:   Glucose-Capillary 139 (*)    All other components within normal limits  CBG MONITORING, ED - Abnormal; Notable for the following components:   Glucose-Capillary 112 (*)    All other components within normal limits  CBG MONITORING, ED - Abnormal; Notable for the following components:   Glucose-Capillary 133 (*)    All other components within normal limits  I-STAT VENOUS BLOOD  GAS, ED - Abnormal; Notable for the following components:   pCO2, Ven 37.1 (*)    pO2, Ven 29.0 (*)    Acid-base deficit 4.0 (*)    Calcium, Ion 1.00 (*)    All other components within normal limits  I-STAT ARTERIAL BLOOD GAS, ED - Abnormal; Notable for the following components:   pH, Arterial 7.467 (*)    pCO2 arterial 31.0 (*)    pO2, Arterial 362 (*)    All other components within normal limits  RESP PANEL BY RT-PCR (FLU A&B, COVID) [id number]  CULTURE, BLOOD (ROUTINE X 2)  CULTURE, BLOOD (ROUTINE X 2)  URINE CULTURE  URINALYSIS, ROUTINE W REFLEX MICROSCOPIC  LACTIC ACID, PLASMA  LACTIC ACID, PLASMA  PROTIME-INR  APTT  BRAIN NATRIURETIC PEPTIDE  CBG MONITORING, ED   EKG None  Radiology DG Chest Port 1 View  Result Date: 06/30/2021 CLINICAL DATA:  Hypoxia.  Additional history provided: Hypoglycemia. EXAM: PORTABLE CHEST 1 VIEW COMPARISON:  Prior chest radiographs 06/28/2021 and earlier. FINDINGS: Cardiomegaly, unchanged. Aortic atherosclerosis. Hazy opacity within the mid to lower lung fields bilaterally compatible with small to, at most moderate, bilateral pleural effusions. Associated bibasilar atelectasis and/or consolidation. No evidence of pneumothorax. No acute bony abnormality identified. IMPRESSION: Cardiomegaly, unchanged. Bilateral small to, at most moderate, bilateral pleural effusions. Associated bibasilar atelectasis and/or consolidation. Aortic Atherosclerosis (ICD10-I70.0). Electronically Signed   By: Kellie Simmering D.O.   On: 06/30/2021 14:42    Procedures Procedures   Medications Ordered in ED Medications - No data to display  ED Course  I have reviewed the triage vital signs and the nursing notes.  Pertinent labs & imaging results that were available during my care of the patient were reviewed by me and considered in my medical decision making (see chart for details).  Called to bedside for O2 sats in the 60s. Patient placed on NRB with improvement into the  70s. Appeared to be good waveform with good pleth.  -ABG obtained which was WNL, so question validity of SpO2 in room. Patient mentating appropriately, not short of breath. - CXR ordered STAT.  MDM Rules/Calculators/A&P 65 year old male who presents emergency department with hypoglycemia   Initial broad work-up initiated on patient. Called to bedside as stated above for O2 sats in the 60s.  Patient was mentating with mild tachypnea.  Placed on nonrebreather with improvement into the upper 70s with a good waveform and [place].  ABG obtained which was within normal limits and PO2 of 360s.  Titrated back down to 3 L. Chest x-ray with bibasilar atelectasis, questionable consolidation. COVID and flu negative Given hypoxia ordering CTA chest to rule out PE.  Initial hypoglycemia on presentation with subsequent blood sugars in the 100s. Given hypoxia and hypoglycemia initiated full sepsis work-up.  At time of hand-off to [facility], PA-C patient pending complete work up. Lactate back at 4.9 during report. [place], PA-C placed empiric antibiotics.   Dispo pending  Final Clinical Impression(s) / ED Diagnoses Final diagnoses:  None    Rx / DC Orders ED Discharge Orders     None        Mickie Hillier, PA-C 06/30/21 1614    Pattricia Boss, MD 07/01/21 [phone]

## 2021-06-30 NOTE — H&P (Addendum)
History and Physical    Stephen Whitaker TDV:[id number] DOB: 12-09-1955 DOA: 06/30/2021  PCP: [name], NP Consultants:  cardiology: Dr. Julieanne Manson, orthopedics: Dr. Sharol Given pulm: Dr. Melvyn Novas Patient coming from:  [place]   Chief Complaint: AMS, hypoglycemia, shortness of breath   HPI: Stephen Whitaker is a 65 y.o. male with medical history significant of COPD, chronic systolic CHF (EF 06-26%), HTN, recent left BKA on 04/21/21 by dr. Sharol Given, T2DM, SVT, PVD, LUTS/BPH with chronic foley who presented from his SNF due to sudden onset of shortness of breath, hypoxia and hypoglycemia. He can not really tell me why he came except for "not eating for 2-3 weeks." He states he has no appetite. He states he has lost 30+ pounds. (Same weight today as he was in 9/22). He states he was feeling fine last night. He did not eat any dinner. He is no longer altered after glucose was corrected.   Per chart review from facility:  Pt found altered with difficulty breathing this AM- not baseline. CBG found to be 29 - oral glucose given and repeat CBG was 22.  Pt with increasing AMS and WOB prompting EMS call. He is not on oxygen and is not normally short or breath or altered. SPO2 trends 94-98% regularly and HR trends 62-70 over the last few weeks.  He denies any headache, vision changes, dizzinesss. He denies any chest pain or palpitations. He denies any coughing or leg swelling. He has been cold. He has no stomach pain, vomiting or diarrhea. He has intermittent nausea. He denies any fever/chills.    ED Course: vitals: Afebrile, blood pressure 103/86, heart rate 100, respiratory rate 18, oxygen 93% on 3L was initially in the 70s with EMS and placed on NRB with transition to 3L [place].  Pertinent labs: Initial glucose less than 10 repeat 139, BUN 56, alkaline phosphatase 189, total bili 1.6, lactic acid 4.9>repeat pending, INR 1.9, BNP >4500 (chronic)  CXR: Cardiomegaly unchanged.  Bilateral small to at most  moderate pleural effusions associated bibasilar atelectasis and/or consolidation.  CTA chest: Bilateral distal central, segmental, subsegmental pulmonary emboli extending to all 5 lobes.  No definite findings of right heart strain.  Cannot rule out a developing pulmonary infarction within the left lower lobe.  Partially collapsed bilateral lower lobes with bubbly groundglass airspace opacities within the left lower lobe.  Left lobe findings could represent infection/inflammation versus atelectasis versus developing pulmonary infarction. In ED patient started on heparin, cefepime, flagyl and vanc.  He was bolused 1 L LR and continued on maintenance fluids. CBG checked q 1 hour. TRH was asked to admit.   Review of Systems: As per HPI; otherwise review of systems reviewed and negative.   Ambulatory Status:  WC bound    Past Medical History:  Diagnosis Date   Abnormal liver function tests    Arthritis    Boil of buttock    COPD (chronic obstructive pulmonary disease) (HCC)    Chronic Bronchitis.Advair inhaler    Dyspnea    GERD (gastroesophageal reflux disease)    takes Protonix daily   Gout    takes Allopurinol daily   Hemorrhoids    Hypertension    takes HCTZ daily   Hypomagnesemia    Leg ulcer (HCC)    Mild mitral regurgitation    Pancreatitis    10 years ago   Pneumonia 2016   Tricuspid regurgitation    Tubular adenoma of colon     Past Surgical History:  Procedure  Laterality Date   AMPUTATION Left 07/14/2014   Procedure: 2nd Toe Amputation;  Surgeon: [name], MD;  Location: [place];  Service: Orthopedics;  Laterality: Left;   AMPUTATION Left 11/16/2015   Procedure: Left Great Toe Amputation at Metatarsophalangeal Joint;  Surgeon: [name], MD;  Location: [place];  Service: Orthopedics;  Laterality: Left;   AMPUTATION Right 06/13/2016   Procedure: RIGHT 2nd Toe Amputation;  Surgeon: [name], MD;  Location: [place];  Service: Orthopedics;  Laterality: Right;    AMPUTATION Left 10/19/2016   Procedure: Amputation Left 3rd Toe;  Surgeon: [name], MD;  Location: [place];  Service: Orthopedics;  Laterality: Left;   AMPUTATION Left 04/21/2021   Procedure: LEFT BELOW KNEE AMPUTATION;  Surgeon: [name], MD;  Location: [place];  Service: Orthopedics;  Laterality: Left;   RIGHT/LEFT HEART CATH AND CORONARY ANGIOGRAPHY N/A 12/12/2020   Procedure: RIGHT/LEFT HEART CATH AND CORONARY ANGIOGRAPHY;  Surgeon: Lorretta Harp, MD;  Location: [facility];  Service: Cardiovascular;  Laterality: N/A;   TOE AMPUTATION Left 11/16/2015   left great toe    TOE AMPUTATION Right 06/13/2016   TONSILLECTOMY      Social History   Socioeconomic History   Marital status: Single    Spouse name: Not on file   Number of children: 1   Years of education:     Highest education level: Some college, no degree  Occupational History   Not on file  Tobacco Use   Smoking status: Never   Smokeless tobacco: Never  Vaping Use   Vaping Use: Never used  Substance and Sexual Activity   Alcohol use: Yes    Alcohol/week: 0.0 standard drinks    Comment: rarely   Drug use: No   Sexual activity: Not Currently  Other Topics Concern   Not on file  Social History Narrative   Not on file   Social Determinants of Health   Financial Resource Strain: Medium Risk   Difficulty of Paying Living Expenses: Somewhat hard  Food Insecurity: Food Insecurity Present   Worried About Running Out of Food in the Last Year: Sometimes true   Ran Out of Food in the Last Year: Never true  Transportation Needs: No Transportation Needs   Lack of Transportation (Medical): No   Lack of Transportation (Non-Medical): No  Physical Activity: Not on file  Stress: Not on file  Social Connections: Not on file  Intimate Partner Violence: Not on file    Allergies  Allergen Reactions   Ciprofloxacin Rash and Other (See Comments)    SYNCOPE    Lactose Intolerance (Gi) Diarrhea   Shellfish  Allergy Anaphylaxis   Sulfa Antibiotics Other (See Comments)    SYNCOPE DIZZINESS    American Cockroach    Cat Hair Extract    Dust Mite Extract    Molds & Smuts     Family History  Adopted: Yes  Problem Relation Age of Onset   Cancer Sister 31       unknown origin     Prior to Admission medications   Medication Sig Start Date End Date Taking? Authorizing Provider  acetaminophen (TYLENOL) 325 MG tablet Take 2 tablets (650 mg total) by mouth every 6 (six) hours as needed for mild pain (or Fever >/= 101). 04/25/21  Yes Nita Sells, MD  albuterol (VENTOLIN HFA) 108 (90 Base) MCG/ACT inhaler Inhale 2 puffs into the lungs every 4 (four) hours as needed for wheezing or shortness  of breath. 10/06/20  Yes Tanda Rockers, MD  azelastine (ASTELIN) 0.1 % nasal spray Place 1-2 sprays into both nostrils 2 (two) times daily as needed (nasal drainage). Use in each nostril as directed Patient taking differently: Place 1 spray into both nostrils 2 (two) times daily as needed (nasal drainage). 01/30/21  Yes Garnet Sierras, DO  colchicine 0.6 MG tablet Take 0.6 mg by mouth daily as needed (gout).   Yes [provider]  EPINEPHrine 0.3 mg/0.3 mL IJ SOAJ injection Inject 0.3 mg into the muscle as needed for anaphylaxis. 05/08/21  Yes Garnet Sierras, DO  feeding supplement, GLUCERNA SHAKE, (GLUCERNA SHAKE) LIQD Take 237 mLs by mouth 3 (three) times daily between meals.   Yes [provider]  fluticasone (FLONASE) 50 MCG/ACT nasal spray Place 1 spray into both nostrils 2 (two) times daily as needed (nasal congestion). Patient taking differently: Place 1 spray into both nostrils in the morning and at bedtime. 05/08/21  Yes Garnet Sierras, DO  fluticasone-salmeterol (ADVAIR) 500-50 MCG/ACT AEPB Inhale 1 puff into the lungs in the morning and at bedtime.   Yes [provider]  furosemide (LASIX) 40 MG tablet Take 2 tablets (80 mg total) by mouth daily. Patient taking differently: Take 40 mg  by mouth daily. 06/23/21  Yes [place], Maricela Bo, FNP  HYDROcodone-acetaminophen (NORCO/VICODIN) 5-325 MG tablet Take 1-2 tablets by mouth every 6 (six) hours as needed for moderate pain or severe pain. Patient taking differently: Take 1 tablet by mouth every 6 (six) hours as needed for moderate pain or severe pain. 04/26/21  Yes Lavina Hamman, MD  ketotifen (ZADITOR) 0.025 % ophthalmic solution Place 1 drop into both eyes daily as needed (itchy, watery eyes).   Yes [provider]  Lactase 9000 units TABS Take 18,000 Units by mouth daily as needed (lactose intolerance).   Yes [provider]  loperamide (IMODIUM) 2 MG capsule Take 1 capsule (2 mg total) by mouth as needed for diarrhea or loose stools. Patient taking differently: Take 2 mg by mouth every 6 (six) hours as needed for diarrhea or loose stools. 05/02/21  Yes Lavina Hamman, MD  metoprolol succinate (TOPROL XL) 25 MG 24 hr tablet Take 1 tablet (25 mg total) by mouth daily. 01/04/21  Yes Chandrasekhar, Mahesh A, MD  mirtazapine (REMERON) 15 MG tablet Take 15 mg by mouth at bedtime.   Yes [provider]  montelukast (SINGULAIR) 10 MG tablet Take 1 tablet (10 mg total) by mouth at bedtime. 05/08/21  Yes Garnet Sierras, DO  Nutritional Supplements (,FEEDING SUPPLEMENT, PROSOURCE PLUS) liquid Take 30 mLs by mouth 3 (three) times daily between meals. 05/02/21  Yes Lavina Hamman, MD  pantoprazole (PROTONIX) 40 MG tablet TAKE ONE TABLET BY MOUTH 30 TO 60 MINUTES BEFORE YOUR FIRST AND LAST MEALS OF THE DAY Patient taking differently: Take 40 mg by mouth 2 (two) times daily. 01/25/21  Yes Tanda Rockers, MD  polyethylene glycol (MIRALAX / GLYCOLAX) 17 g packet Take 17 g by mouth daily as needed for mild constipation. 04/25/21  Yes Nita Sells, MD  Probiotic Product (SUPER PROBIOTIC DIGESTIVE) CAPS Take 1 tablet by mouth 3 (three) times daily as needed (stomach upset).   Yes [provider]  sacubitril-valsartan  (ENTRESTO) 24-26 MG Take 1 tablet by mouth 2 (two) times daily. 01/24/21  Yes Chandrasekhar, Mahesh A, MD  Spirulina 500 MG TABS Take 500 mg by mouth in the morning and at bedtime.  Yes [provider]  tamsulosin (FLOMAX) 0.4 MG CAPS capsule Take 1 capsule (0.4 mg total) by mouth daily after supper. 04/25/21  Yes Nita Sells, MD  Trolamine Salicylate (BLUE-EMU HEMP EX) Apply 1 application topically daily as needed (pain).   Yes [provider]  vitamin C (ASCORBIC ACID) 500 MG tablet Take 1,000 mg by mouth daily.   Yes [provider]  zinc sulfate 220 (50 Zn) MG capsule Take 1 capsule (220 mg total) by mouth daily. 05/03/21  Yes Lavina Hamman, MD  ascorbic acid (VITAMIN C) 1000 MG tablet Take 1 tablet (1,000 mg total) by mouth daily. Patient not taking: No sig reported 05/03/21   Lavina Hamman, MD  budesonide-formoterol [facility]) 160-4.5 MCG/ACT inhaler USE 2 INHALATIONS TWICE A DAY Patient not taking: Reported on 06/30/2021 03/13/21   Tanda Rockers, MD  lactase (LACTAID) 3000 units tablet Take 3 tablets (9,000 Units total) by mouth 3 (three) times daily with meals. Patient not taking: No sig reported 05/02/21   Lavina Hamman, MD  Multiple Vitamin (MULTIVITAMIN WITH MINERALS) TABS tablet Take 1 tablet by mouth daily. Patient not taking: Reported on 06/30/2021 05/03/21   Lavina Hamman, MD  Olopatadine HCl 0.2 % SOLN Apply 1 drop to eye daily as needed (itchy/watery eyes). Patient not taking: Reported on 06/30/2021 01/30/21   Garnet Sierras, DO    Physical Exam: Vitals:   06/30/21 1619 06/30/21 1630 06/30/21 1735 06/30/21 1800  BP: 102/88 110/85 (!) 118/93 (!) 114/94  Pulse: 99 98 100 (!) 103  Resp: 17 19 (!) 25 (!) 25  Temp: 99 F (37.2 C)     TempSrc: Rectal     SpO2: 92% 90% 91% 91%  Weight:         General:  Appears calm and comfortable and is in NAD. Cachetic.  Eyes:  PERRL, EOMI, normal lids, iris ENT:  grossly normal hearing, lips & tongue  dry mucous membranes; poor  dentition Neck:  no LAD, masses or thyromegaly; no carotid bruits. Mild JVD Cardiovascular:  RRR, no m/r/g. No LE edema of right LE.  Respiratory:   Crackles in LLL and decreased air sounds in bilateral bases. Mild tachypnea with talking.  Back:   normal alignment, no CVAT Skin:  no rash or induration seen on limited exam. Left BKA well healed stump.  Musculoskeletal:  globally weak, good ROM, no bony abnormality Lower extremity:  Left BKA. Cold to touch.  Right LE with no edema.   Limited foot exam with no ulcerations.  2+ distal pulses. Psychiatric:  grossly normal mood and affect, speech fluent and appropriate, AOx3 Neurologic:  CN 2-12 grossly intact, moves all extremities in coordinated fashion, sensation intact    Radiological Exams on Admission: Independently reviewed - see discussion in A/P where applicable  CT Angio Chest PE W/Cm &/Or Wo Cm  Result Date: 06/30/2021 CLINICAL DATA:  PE suspected, high prob EXAM: CT ANGIOGRAPHY CHEST WITH CONTRAST TECHNIQUE: Multidetector CT imaging of the chest was performed using the standard protocol during bolus administration of intravenous contrast. Multiplanar CT image reconstructions and MIPs were obtained to evaluate the vascular anatomy. CONTRAST:  83mL OMNIPAQUE IOHEXOL 350 MG/ML SOLN COMPARISON:  None. FINDINGS: Cardiovascular: Satisfactory opacification of the pulmonary arteries to the segmental level. No evidence of pulmonary embolism. Cardiomegaly with no definite increased right to left ventricular ratio. Retrograde reflux of intravenous contrast within the inferior vena cava and hepatic veins likely due to timing of contrast given no contrast  noted within the left heart. No significant pericardial effusion. The thoracic aorta is normal in caliber. Mild atherosclerotic plaque of the thoracic aorta. No coronary artery calcifications. Mediastinum/Nodes: No enlarged mediastinal, hilar, or axillary lymph nodes. Thyroid  gland, trachea, and esophagus demonstrate no significant findings. Lungs/Pleura: Partial collapse of bilateral lower lobes. No focal consolidation. No pulmonary nodule. No pulmonary mass. Bilateral small to moderate pleural effusions. No pneumothorax. Upper Abdomen: No acute abnormality. Musculoskeletal: No chest wall abnormality. No suspicious lytic or blastic osseous lesions. No acute displaced fracture. Multilevel degenerative changes of the spine. Review of the MIP images confirms the above findings. IMPRESSION: 1. Bilateral distal central, segmental, subsegmental pulmonary emboli extending to all five lobes. No definite findings of right heart strain. Cannot rule out a developing pulmonary infarction within left lower lobe. 2. Bilateral small to moderate pleural effusions. 3. Partially collapsed bilateral lower lobes with bubbly ground-glass airspace opacities within the left lower lobe. Left lower lobe findings could represent infection/inflammation versus atelectasis versus developing pulmonary infarction. Recommend follow-up CT in 3 months to evaluate for resolution. These results were called by telephone at the time of interpretation on 06/30/2021 at 5:25 pm to provider Dr. Sabra Heck, who verbally acknowledged these results. Electronically Signed   By: Iven Finn M.D.   On: 06/30/2021 17:33   DG Chest Port 1 View  Result Date: 06/30/2021 CLINICAL DATA:  Hypoxia.  Additional history provided: Hypoglycemia. EXAM: PORTABLE CHEST 1 VIEW COMPARISON:  Prior chest radiographs 06/28/2021 and earlier. FINDINGS: Cardiomegaly, unchanged. Aortic atherosclerosis. Hazy opacity within the mid to lower lung fields bilaterally compatible with small to, at most moderate, bilateral pleural effusions. Associated bibasilar atelectasis and/or consolidation. No evidence of pneumothorax. No acute bony abnormality identified. IMPRESSION: Cardiomegaly, unchanged. Bilateral small to, at most moderate, bilateral pleural  effusions. Associated bibasilar atelectasis and/or consolidation. Aortic Atherosclerosis (ICD10-I70.0). Electronically Signed   By: Kellie Simmering D.O.   On: 06/30/2021 14:42    EKG: Independently reviewed.  Sinus tachycardia with rate 101; nonspecific ST changes with no evidence of acute ischemia   Labs on Admission: I have personally reviewed the available labs and imaging studies at the time of the admission.  Pertinent labs:  Initial glucose less than 10 repeat 139,  BUN 56,  alkaline phosphatase 189,  total bili 1.6,  lactic acid 4.9>repeat pending,  INR 1.9,  BNP >4500 (chronic)  Troponin: pending   Assessment/Plan Active Problems:   Pulmonary embolism with acute respiratory failure with hypoxia  -65 year old male presenting with sudden onset shortness of breath and hypoxia to the 70s on room air with accessory muscle use, retractions and tachypnea. Initially required NRB, but maintaining oxygenation on 3L [place] found to have significant bilateral PE -admit to progressive -pharmacy consulted and started on heparin drip.  -he has no known hx of blood clots, family history. No recent travel, vaccination and he does not smoke. Had recent surgery in September and is in bed/wheelchair.  -no heart strain on CT. Troponin still pending, echo ordered   -continue to wean oxygen as tolerated -does have small to moderate bilateral pleural effusions and ? Developing pulmonary infarction in left lower lobe as well as infection vs. Inflammation which could also be contributing to hypoxia.     Type 2 diabetes mellitus with hypoglycemia without coma ([place]) -concern for sepsis with severe hypoglycemia. On no oral diabetic drugs or insulin. Poor Po intake could also be contributing.  -sepsis work up below -CBG have been stable, continue q1 hour x  3 hours and then will space out, encouraged him to eat but he has limited desire to do this -a1C 6.4 8/22.  -will hold off on SSI at this point until sugars  elevated.     Sepsis ([place]) -patient presenting with tachycardia, tachypnea, hypoxia with elevated lactic acid, hypoglycemia and finding on CT that can not rule out a pneumonia. He also has a sacral ulcer.  -code sepsis activated  -given IL bolus and very gentle IVF with EF of 20%. Will do time limited fluids at gentle rate as he easily gets fluid overloaded. Blood pressures have been stable.  -broad spectrum antibiotics with flagyl, vanc, cefepime with no definitive source; however, will stop flagyl as no signs of intra abdominal infection. No stomach pain/diarrhea. Continue cefepime and vancomycin with possible pna/ulcer as source vs. Urine.  -has chronic foley and UA likely contaminated. Culture pending -procalcitonin has been ordered and will trend lactic acid  -tailor antibiotics as indicated    HFrEF (heart failure with reduced ejection fraction) (HCC) -elevated BNP to >4500, however has been his baseline past few months.  -clinically appears more dry then any volume overload, but seems to quickly get volume overloaded looking at past hospitalizations.  -echo 4/22: EF of 20-25% with severely decreased LVF. LV with global hypokinesis. Grade II diastolic dysfunction. Right VF mildly reduced. Severely elevated atria.  -heart cath 4/22: clean coronary arteries. Moderately elevated wedge and filling pressures.  -strict I/O and daily weights. Weight unchanged from 04/2021.  -hold entresto/lasix with soft BP, elevated BUN -received 1L bolus and fluids at 150cc/hour in ED. Decreased IVF to 50cc/hour x 10 hours. Blood pressures have been stable.  -repeat echo pending.   Failure to thrive  -patient with poor PO intake over past 3 weeks with cachetic appearance, weight loss and no desire to eat -consult nutrition -did discuss with him a palliative care consult. Will need to discuss further with him and family     Essential hypertension -soft BP, hold meds   Hx SVT Continue telemetry Hold beta  blocker with soft BP    Chronic bronchitis ([place]) -continue home inhalers and prn SABA.    Gastroesophageal reflux disease without esophagitis  -continue protonix  Sacral ulcer Wound care consult   Chronic foley catheter Placed at last hospitalization, states he has seen urology Continue flomax/f/u with urology  Catheter care   Body mass index is 25.67 kg/m.    Level of care: Progressive DVT prophylaxis: heparin   Code Status:  Full - confirmed with patient  Family Communication: None present; I attempted to call son at the time of admission. Disposition Plan:  The patient is from: SNF  Anticipated d/c is to: SNF   Requires inpatient hospitalization and is at significant risk of worsening, requires constant monitoring, IVF, IV medication, oxygen. Critically ill and needing >2 midnight stay.    Patient is currently: acutely ill Consults called: none   Admission status:  inpatient   Dragon dictation used in completing this note.   Orma Flaming MD [facility]   How to contact the [facility] Attending or Consulting provider [place] or covering provider during after hours [place], for this patient?  Check the care team in [facility] and look for a) attending/consulting [facility] provider listed and b) the [facility] team listed Log into [email or web address] and use [place]'s universal password to access. If you do not have the password, please contact the hospital operator. Locate the [facility] provider you are looking for under  [facility] and page to a number that you can be directly reached. If you still have difficulty reaching the provider, please page the [facility] (Director on Call) for the Hospitalists listed on amion for assistance.   06/30/2021, 6:30 PM

## 2021-06-30 NOTE — ED Provider Notes (Signed)
This patient is 65 years old presenting with hypoglycemia, also appears to be borderline tachycardic and significantly weak, on exam patient has a heart rate of 105.   EKG performed on June 30, 2021 at 5:30 PM shows sinus tachycardia rate of 101 bpm, normal axis, normal intervals other than a slightly prolonged QRS interval, this intraventricular conduction delay.  There is T wave inversions in the inferior leads as well as leads V1 through V3.  There is no ST elevation.  There is no left ventricular hypertrophy.  Abnormal EKG  Patient is critically ill with what appears to be large bilateral pulmonary embolisms.  I discussed the case with the radiologist who states that there is no right heart strain despite the bulky load of clot   Noemi Chapel, MD 07/02/21 1457

## 2021-06-30 NOTE — Sepsis Progress Note (Signed)
Code sepsis protocol being monitored by eLink. 

## 2021-06-30 NOTE — Progress Notes (Signed)
Pharmacy Antibiotic Note  Stephen Whitaker is a 65 y.o. male admitted on 06/30/2021 with sepsis.  Pharmacy has been consulted for vancomycin and cefepime dosing.  Plan: Vancomycin 1750mg  x1 then 1000mg  IV q12h (eAUC 498, Cr 1.23 - bsl slightly less around 1) Cefepime 2g IV q8h -Consider MRSA PCR -Monitor renal function, clinical status, and antibiotic plan  Weight: 90.7 kg (199 lb 15.3 oz)  Temp (24hrs), Avg:98 F (36.7 C), Min:97.9 F (36.6 C), Max:98.1 F (36.7 C)  Recent Labs  Lab 06/28/21 0907 06/28/21 0925 06/30/21 1002 06/30/21 1004 06/30/21 1433  WBC 4.8  --  5.1  --   --   CREATININE 1.35* 1.20  --  1.23  --   LATICACIDVEN  --   --   --   --  4.9*    Estimated Creatinine Clearance: 68.7 mL/min (by C-G formula based on SCr of 1.23 mg/dL).    Allergies  Allergen Reactions   Ciprofloxacin Rash and Other (See Comments)    SYNCOPE    Lactose Intolerance (Gi) Diarrhea   Shellfish Allergy Anaphylaxis   Sulfa Antibiotics Other (See Comments)    SYNCOPE DIZZINESS    American Cockroach    Cat Hair Extract    Dust Mite Extract    Molds & Smuts     Antimicrobials this admission: Vanc 11/11 >>  Cefepime 11/11 >>   Dose adjustments this admission: N/A  Microbiology results: 11/11 BCx: pending 11/11 UCx: pending   Thank you for allowing pharmacy to be a part of this patient's care.  Joetta Manners, PharmD, [facility] Emergency Medicine Clinical Pharmacist ED RPh Phone: [place]: [phone]

## 2021-06-30 NOTE — ED Provider Notes (Signed)
Care assumed from [facility].  Please see her full H&P.  In short,  Stephen Whitaker is a 65 y.o. male presents for hypoglycemia, altered mental status and increased work of breathing.  Reports he has not had much of an appetite for the last few weeks and has not been eating but had no other complaints.  States he is not feeling short of breath and does not have chest pain however is clearly tachypneic with increased work of breathing.   Physical Exam  BP 102/88 (BP Location: Right Arm)   Pulse 99   Temp 99 F (37.2 C) (Rectal)   Resp 17   Wt 90.7 kg   SpO2 92%   BMI 25.67 kg/m   Physical Exam Vitals and nursing note reviewed.  Constitutional:      General: He is in acute distress.     Appearance: He is well-developed. He is ill-appearing.     Comments: Cachectic  HENT:     Head: Normocephalic.     Nose: Nose normal.     Mouth/Throat:     Mouth: Mucous membranes are dry.     Comments: Dry mucous membranes Eyes:     General: No scleral icterus.    Conjunctiva/sclera: Conjunctivae normal.     Pupils: Pupils are equal, round, and reactive to light.  Neck:     Vascular: JVD (mild) present.  Cardiovascular:     Rate and Rhythm: Tachycardia present.     Pulses:          Radial pulses are 1+ on the right side and 1+ on the left side.       Popliteal pulses are 1+ on the right side and 1+ on the left side.        Left dorsalis pedis pulse not accessible.        Left posterior tibial pulse not accessible.  Pulmonary:     Effort: Tachypnea and accessory muscle usage present.     Breath sounds: Normal breath sounds.  Abdominal:     General: There is no distension.     Palpations: Abdomen is soft.     Tenderness: There is no abdominal tenderness.  Genitourinary:     Musculoskeletal:        General: Normal range of motion.     Cervical back: Normal range of motion.       Legs:     Left Lower Extremity: Left leg is amputated below knee.  Skin:    General: Skin is warm  and dry.  Neurological:     Mental Status: He is alert.  Psychiatric:        Mood and Affect: Mood normal.    ED Course/Procedures   Clinical Course as of 06/30/21 1753  Fri Jun 30, 2021  1635 Lactic Acid, Venous(!!): 4.9 Fluids started in 265mL aloquots as pt has sever HF [HM]  1752 B Natriuretic Peptide(!): >4,500.0 Very high, appears to be baseline [HM]  1752 Total Bilirubin(!): 1.6 Baseline [HM]  1752 BUN(!): 56 Baseline [HM]    Clinical Course User Index [HM] Tausha Milhoan, Jarrett Soho, PA-C    .Critical Care Performed by: Abigail Butts, PA-C Authorized by: Abigail Butts, PA-C   Critical care provider statement:    Critical care time (minutes):  75   Critical care time was exclusive of:  Separately billable procedures and treating other patients and teaching time   Critical care was necessary to treat or prevent imminent or life-threatening deterioration of the following conditions:  Sepsis, respiratory failure and endocrine crisis   Critical care was time spent personally by me on the following activities:  Development of treatment plan with patient or surrogate, discussions with consultants, evaluation of patient's response to treatment, examination of patient, ordering and review of laboratory studies, ordering and review of radiographic studies, ordering and performing treatments and interventions, pulse oximetry, re-evaluation of patient's condition and review of old charts   I assumed direction of critical care for this patient from another provider in my specialty: no     Care discussed with: admitting provider    Results for orders placed or performed during the hospital encounter of 06/30/21  Resp Panel by RT-PCR (Flu A&B, Covid) Nasopharyngeal Swab   Specimen: Nasopharyngeal Swab; Nasopharyngeal(NP) swabs in vial transport medium  Result Value Ref Range   SARS Coronavirus 2 by RT PCR NEGATIVE NEGATIVE   Influenza A by PCR NEGATIVE NEGATIVE   Influenza  B by PCR NEGATIVE NEGATIVE  CBC with Differential  Result Value Ref Range   WBC 5.1 4.0 - 10.5 K/uL   RBC 5.23 4.22 - 5.81 MIL/uL   Hemoglobin 14.6 13.0 - 17.0 g/dL   HCT 48.5 39.0 - 52.0 %   MCV 92.7 80.0 - 100.0 fL   MCH 27.9 26.0 - 34.0 pg   MCHC 30.1 30.0 - 36.0 g/dL   RDW 20.3 (H) 11.5 - 15.5 %   Platelets 197 150 - 400 K/uL   nRBC 1.8 (H) 0.0 - 0.2 %   Neutrophils Relative % 72 %   Neutro Abs 3.7 1.7 - 7.7 K/uL   Lymphocytes Relative 21 %   Lymphs Abs 1.1 0.7 - 4.0 K/uL   Monocytes Relative 6 %   Monocytes Absolute 0.3 0.1 - 1.0 K/uL   Eosinophils Relative 0 %   Eosinophils Absolute 0.0 0.0 - 0.5 K/uL   Basophils Relative 0 %   Basophils Absolute 0.0 0.0 - 0.1 K/uL   Immature Granulocytes 1 %   Abs Immature Granulocytes 0.05 0.00 - 0.07 K/uL  Comprehensive metabolic panel  Result Value Ref Range   Sodium 134 (L) 135 - 145 mmol/L   Potassium 4.4 3.5 - 5.1 mmol/L   Chloride 102 98 - 111 mmol/L   CO2 19 (L) 22 - 32 mmol/L   Glucose, Bld 102 (H) 70 - 99 mg/dL   BUN 56 (H) 8 - 23 mg/dL   Creatinine, Ser 1.23 0.61 - 1.24 mg/dL   Calcium 8.8 (L) 8.9 - 10.3 mg/dL   Total Protein 7.6 6.5 - 8.1 g/dL   Albumin 2.4 (L) 3.5 - 5.0 g/dL   AST 28 15 - 41 U/L   ALT 10 0 - 44 U/L   Alkaline Phosphatase 189 (H) 38 - 126 U/L   Total Bilirubin 1.6 (H) 0.3 - 1.2 mg/dL   GFR, Estimated >60 >60 mL/min   Anion gap 13 5 - 15  Lactic acid, plasma  Result Value Ref Range   Lactic Acid, Venous 4.9 (HH) 0.5 - 1.9 mmol/L  Protime-INR  Result Value Ref Range   Prothrombin Time 21.4 (H) 11.4 - 15.2 seconds   INR 1.9 (H) 0.8 - 1.2  APTT  Result Value Ref Range   aPTT 29 24 - 36 seconds  Brain natriuretic peptide  Result Value Ref Range   B Natriuretic Peptide >4,500.0 (H) 0.0 - 100.0 pg/mL  CBG monitoring, ED  Result Value Ref Range   Glucose-Capillary <10 (LL) 70 - 99 mg/dL  CBG monitoring, ED  Result Value Ref Range   Glucose-Capillary 139 (H) 70 - 99 mg/dL  CBG monitoring, ED   Result Value Ref Range   Glucose-Capillary 112 (H) 70 - 99 mg/dL  CBG monitoring, ED  Result Value Ref Range   Glucose-Capillary 133 (H) 70 - 99 mg/dL  I-Stat venous blood gas, ED  Result Value Ref Range   pH, Ven 7.361 7.250 - 7.430   pCO2, Ven 37.1 (L) 44.0 - 60.0 mmHg   pO2, Ven 29.0 (LL) 32.0 - 45.0 mmHg   Bicarbonate 21.0 20.0 - 28.0 mmol/L   TCO2 22 22 - 32 mmol/L   O2 Saturation 54.0 %   Acid-base deficit 4.0 (H) 0.0 - 2.0 mmol/L   Sodium 136 135 - 145 mmol/L   Potassium 4.3 3.5 - 5.1 mmol/L   Calcium, Ion 1.00 (L) 1.15 - 1.40 mmol/L   HCT 49.0 39.0 - 52.0 %   Hemoglobin 16.7 13.0 - 17.0 g/dL   Sample type VENOUS    Comment NOTIFIED PHYSICIAN   I-Stat arterial blood gas, [facility] ED)  Result Value Ref Range   pH, Arterial 7.467 (H) 7.350 - 7.450   pCO2 arterial 31.0 (L) 32.0 - 48.0 mmHg   pO2, Arterial 362 (H) 83.0 - 108.0 mmHg   Bicarbonate 22.5 20.0 - 28.0 mmol/L   TCO2 23 22 - 32 mmol/L   O2 Saturation 100.0 %   Acid-Base Excess 0.0 0.0 - 2.0 mmol/L   Sodium 136 135 - 145 mmol/L   Potassium 4.4 3.5 - 5.1 mmol/L   Calcium, Ion 1.20 1.15 - 1.40 mmol/L   HCT 44.0 39.0 - 52.0 %   Hemoglobin 15.0 13.0 - 17.0 g/dL   Patient temperature 97.9 F    Sample type ARTERIAL   POC CBG, ED  Result Value Ref Range   Glucose-Capillary 120 (H) 70 - 99 mg/dL   CT Angio Chest PE W/Cm &/Or Wo Cm  Result Date: 06/30/2021 CLINICAL DATA:  PE suspected, high prob EXAM: CT ANGIOGRAPHY CHEST WITH CONTRAST TECHNIQUE: Multidetector CT imaging of the chest was performed using the standard protocol during bolus administration of intravenous contrast. Multiplanar CT image reconstructions and MIPs were obtained to evaluate the vascular anatomy. CONTRAST:  75mL OMNIPAQUE IOHEXOL 350 MG/ML SOLN COMPARISON:  None. FINDINGS: Cardiovascular: Satisfactory opacification of the pulmonary arteries to the segmental level. No evidence of pulmonary embolism. Cardiomegaly with no definite increased right to left  ventricular ratio. Retrograde reflux of intravenous contrast within the inferior vena cava and hepatic veins likely due to timing of contrast given no contrast noted within the left heart. No significant pericardial effusion. The thoracic aorta is normal in caliber. Mild atherosclerotic plaque of the thoracic aorta. No coronary artery calcifications. Mediastinum/Nodes: No enlarged mediastinal, hilar, or axillary lymph nodes. Thyroid gland, trachea, and esophagus demonstrate no significant findings. Lungs/Pleura: Partial collapse of bilateral lower lobes. No focal consolidation. No pulmonary nodule. No pulmonary mass. Bilateral small to moderate pleural effusions. No pneumothorax. Upper Abdomen: No acute abnormality. Musculoskeletal: No chest wall abnormality. No suspicious lytic or blastic osseous lesions. No acute displaced fracture. Multilevel degenerative changes of the spine. Review of the MIP images confirms the above findings. IMPRESSION: 1. Bilateral distal central, segmental, subsegmental pulmonary emboli extending to all five lobes. No definite findings of right heart strain. Cannot rule out a developing pulmonary infarction within left lower lobe. 2. Bilateral small to moderate pleural effusions. 3. Partially collapsed bilateral lower lobes with bubbly ground-glass airspace opacities within the left lower lobe. Left  lower lobe findings could represent infection/inflammation versus atelectasis versus developing pulmonary infarction. Recommend follow-up CT in 3 months to evaluate for resolution. These results were called by telephone at the time of interpretation on 06/30/2021 at 5:25 pm to provider Dr. Sabra Heck, who verbally acknowledged these results. Electronically Signed   By: Iven Finn M.D.   On: 06/30/2021 17:33   DG Chest Port 1 View  Result Date: 06/30/2021 CLINICAL DATA:  Hypoxia.  Additional history provided: Hypoglycemia. EXAM: PORTABLE CHEST 1 VIEW COMPARISON:  Prior chest radiographs  06/28/2021 and earlier. FINDINGS: Cardiomegaly, unchanged. Aortic atherosclerosis. Hazy opacity within the mid to lower lung fields bilaterally compatible with small to, at most moderate, bilateral pleural effusions. Associated bibasilar atelectasis and/or consolidation. No evidence of pneumothorax. No acute bony abnormality identified. IMPRESSION: Cardiomegaly, unchanged. Bilateral small to, at most moderate, bilateral pleural effusions. Associated bibasilar atelectasis and/or consolidation. Aortic Atherosclerosis (ICD10-I70.0). Electronically Signed   By: Kellie Simmering D.O.   On: 06/30/2021 14:42   DG Chest Portable 1 View  Result Date: 06/28/2021 CLINICAL DATA:  Hypoglycemia, altered mental status. EXAM: PORTABLE CHEST 1 VIEW COMPARISON:  April 15, 2021. FINDINGS: Stable cardiomegaly. Increased bibasilar opacities are noted concerning for edema or atelectasis with associated pleural effusions. Bony thorax is unremarkable. IMPRESSION: Increased bibasilar opacities are noted concerning for edema or atelectasis with associated pleural effusions. Electronically Signed   By: Marijo Conception M.D.   On: 06/28/2021 09:31     ECG:   Flipped T waves noted in V2, V3.  Questionable minimal depression in 2 3 and aVF.   MDM  Pt care transitioned at signout.  Abnormal vitals persist.  Patient remains ill-appearing.  Additional labs and imaging pending.  4:19 PM Lactic acid 4.9.  Concern for severe sepsis given hypoxia, hypoglycemia and tachycardia.  Antibiotics initiated along with blood cultures.  4:31 PM Discussed with facility:  Pt found altered with difficulty breathing - not baseline. CBG found to be 29 - oral glucose given and repeat CBG was 22.  Pt with increasing AMS and WOB prompting EMS call. He is not on oxygen and is not normally short or breath or altered. SPO2 trends 94-98% regularly and HR trends 62-70 over the last few weeks.  Rn reports pt just returned to the facility after a similar  episode - she believes it was a [facility] facility. No diagnosis of DM in their records and does not take medications for this.  She reports he has had a poor appetite over the last few weeks and has been refusing meals for just over 1 week.  Temp this morning was 97.1.  PCP: [place] @ [phone]  Epic: Archie Patten, NP   5:53 PM Additional labs show significant elevation in BNP but this appears to be patient baseline.  Action saturations now in the low 90s on 3 L via nasal cannula.  CT angiogram with: Bilateral distal central, segmental, subsegmental pulmonary emboli extending to all five lobes.  No definite heart strain noted on CT scan.  Also of concern is left lower lobe necrosis versus infiltrate.  Given patient's initial concern for sepsis antibiotics have been started.  Heparin initiated.  Patient will be admitted.  The patient was discussed with and evaluated by Dr. Sabra Heck who agrees with the treatment plan.   5:59 PM Pt admitted for further management.     Shenica Holzheimer, Gwenlyn Perking 06/30/21 2116    Noemi Chapel, MD 07/02/21 [phone]

## 2021-07-01 ENCOUNTER — Inpatient Hospital Stay (HOSPITAL_COMMUNITY): Payer: BC Managed Care – PPO

## 2021-07-01 ENCOUNTER — Telehealth: Payer: Self-pay | Admitting: Emergency Medicine

## 2021-07-01 DIAGNOSIS — I5020 Unspecified systolic (congestive) heart failure: Secondary | ICD-10-CM | POA: Diagnosis not present

## 2021-07-01 DIAGNOSIS — E162 Hypoglycemia, unspecified: Secondary | ICD-10-CM

## 2021-07-01 DIAGNOSIS — J9601 Acute respiratory failure with hypoxia: Secondary | ICD-10-CM

## 2021-07-01 DIAGNOSIS — I10 Essential (primary) hypertension: Secondary | ICD-10-CM

## 2021-07-01 LAB — COMPREHENSIVE METABOLIC PANEL
ALT: 10 U/L (ref 0–44)
AST: 23 U/L (ref 15–41)
Albumin: 2 g/dL — ABNORMAL LOW (ref 3.5–5.0)
Alkaline Phosphatase: 147 U/L — ABNORMAL HIGH (ref 38–126)
Anion gap: 10 (ref 5–15)
BUN: 56 mg/dL — ABNORMAL HIGH (ref 8–23)
CO2: 23 mmol/L (ref 22–32)
Calcium: 8.3 mg/dL — ABNORMAL LOW (ref 8.9–10.3)
Chloride: 101 mmol/L (ref 98–111)
Creatinine, Ser: 1.09 mg/dL (ref 0.61–1.24)
GFR, Estimated: >60 mL/min (ref >60)
Glucose, Bld: 146 mg/dL — ABNORMAL HIGH (ref 70–99)
Potassium: 3.9 mmol/L (ref 3.5–5.1)
Sodium: 134 mmol/L — ABNORMAL LOW (ref 135–145)
Total Bilirubin: 1.5 mg/dL — ABNORMAL HIGH (ref 0.3–1.2)
Total Protein: 6.4 g/dL — ABNORMAL LOW (ref 6.5–8.1)

## 2021-07-01 LAB — CBC
HCT: 38.5 % — ABNORMAL LOW (ref 39.0–52.0)
Hemoglobin: 12.2 g/dL — ABNORMAL LOW (ref 13.0–17.0)
MCH: 27.9 pg (ref 26.0–34.0)
MCHC: 31.7 g/dL (ref 30.0–36.0)
MCV: 88.1 fL (ref 80.0–100.0)
Platelets: 175 10*3/uL (ref 150–400)
RBC: 4.37 MIL/uL (ref 4.22–5.81)
RDW: 19.4 % — ABNORMAL HIGH (ref 11.5–15.5)
WBC: 5.6 10*3/uL (ref 4.0–10.5)
nRBC: 1.6 % — ABNORMAL HIGH (ref 0.0–0.2)

## 2021-07-01 LAB — ECHOCARDIOGRAM COMPLETE
AR max vel: 1.22 cm2
AV Area VTI: 1.13 cm2
AV Area mean vel: 1.11 cm2
AV Mean grad: 2 mmHg
AV Peak grad: 3.7 mmHg
Ao pk vel: 0.96 m/s
Area-P 1/2: 7.02 cm2
Height: 74 in
S' Lateral: 5.1 cm
Weight: 2518.54 oz

## 2021-07-01 LAB — GLUCOSE, CAPILLARY
Glucose-Capillary: 110 mg/dL — ABNORMAL HIGH (ref 70–99)
Glucose-Capillary: 111 mg/dL — ABNORMAL HIGH (ref 70–99)
Glucose-Capillary: 115 mg/dL — ABNORMAL HIGH (ref 70–99)
Glucose-Capillary: 122 mg/dL — ABNORMAL HIGH (ref 70–99)
Glucose-Capillary: 130 mg/dL — ABNORMAL HIGH (ref 70–99)
Glucose-Capillary: 138 mg/dL — ABNORMAL HIGH (ref 70–99)
Glucose-Capillary: 142 mg/dL — ABNORMAL HIGH (ref 70–99)
Glucose-Capillary: 158 mg/dL — ABNORMAL HIGH (ref 70–99)
Glucose-Capillary: 169 mg/dL — ABNORMAL HIGH (ref 70–99)
Glucose-Capillary: 180 mg/dL — ABNORMAL HIGH (ref 70–99)
Glucose-Capillary: 207 mg/dL — ABNORMAL HIGH (ref 70–99)
Glucose-Capillary: 215 mg/dL — ABNORMAL HIGH (ref 70–99)
Glucose-Capillary: 271 mg/dL — ABNORMAL HIGH (ref 70–99)
Glucose-Capillary: 48 mg/dL — ABNORMAL LOW (ref 70–99)
Glucose-Capillary: 52 mg/dL — ABNORMAL LOW (ref 70–99)
Glucose-Capillary: 58 mg/dL — ABNORMAL LOW (ref 70–99)
Glucose-Capillary: 65 mg/dL — ABNORMAL LOW (ref 70–99)
Glucose-Capillary: 68 mg/dL — ABNORMAL LOW (ref 70–99)
Glucose-Capillary: 76 mg/dL (ref 70–99)
Glucose-Capillary: 78 mg/dL (ref 70–99)
Glucose-Capillary: 80 mg/dL (ref 70–99)
Glucose-Capillary: 81 mg/dL (ref 70–99)
Glucose-Capillary: 84 mg/dL (ref 70–99)
Glucose-Capillary: 92 mg/dL (ref 70–99)
Glucose-Capillary: 93 mg/dL (ref 70–99)

## 2021-07-01 LAB — MRSA NEXT GEN BY PCR, NASAL: MRSA by PCR Next Gen: NOT DETECTED

## 2021-07-01 LAB — HEPARIN LEVEL (UNFRACTIONATED)
Heparin Unfractionated: 0.92 IU/mL — ABNORMAL HIGH (ref 0.30–0.70)
Heparin Unfractionated: 1.01 IU/mL — ABNORMAL HIGH (ref 0.30–0.70)

## 2021-07-01 LAB — GLUCOSE, RANDOM: Glucose, Bld: 121 mg/dL — ABNORMAL HIGH (ref 70–99)

## 2021-07-01 LAB — PROCALCITONIN: Procalcitonin: 1.91 ng/mL

## 2021-07-01 MED ORDER — DEXTROSE 50 % IV SOLN
25.0000 g | Freq: Once | INTRAVENOUS | Status: AC
  Administered 2021-07-01: 25 g via INTRAVENOUS

## 2021-07-01 MED ORDER — POTASSIUM CHLORIDE 10 MEQ/100ML IV SOLN
10.0000 meq | INTRAVENOUS | Status: AC
  Administered 2021-07-01 (×2): 10 meq via INTRAVENOUS
  Filled 2021-07-01 (×2): qty 100

## 2021-07-01 MED ORDER — ARFORMOTEROL TARTRATE 15 MCG/2ML IN NEBU
15.0000 ug | INHALATION_SOLUTION | Freq: Two times a day (BID) | RESPIRATORY_TRACT | Status: DC
Start: 2021-07-01 — End: 2021-07-02
  Administered 2021-07-02: 15 ug via RESPIRATORY_TRACT
  Filled 2021-07-01: qty 2

## 2021-07-01 MED ORDER — DEXTROSE 50 % IV SOLN
INTRAVENOUS | Status: AC
  Filled 2021-07-01: qty 50

## 2021-07-01 MED ORDER — HYDROCORTISONE SOD SUC (PF) 100 MG IJ SOLR
100.0000 mg | Freq: Once | INTRAMUSCULAR | Status: AC
  Administered 2021-07-01: 100 mg via INTRAVENOUS
  Filled 2021-07-01: qty 2

## 2021-07-01 MED ORDER — JUVEN PO PACK
1.0000 | PACK | Freq: Two times a day (BID) | ORAL | Status: DC
  Administered 2021-07-02 – 2021-07-03 (×2): 1 via ORAL
  Filled 2021-07-01 (×5): qty 1

## 2021-07-01 MED ORDER — STERILE WATER FOR INJECTION IJ SOLN
INTRAMUSCULAR | Status: AC
  Filled 2021-07-01: qty 10

## 2021-07-01 MED ORDER — GLUCAGON HCL RDNA (DIAGNOSTIC) 1 MG IJ SOLR
1.0000 mg | Freq: Once | INTRAMUSCULAR | Status: AC
  Administered 2021-07-01: 1 mg via INTRAVENOUS
  Filled 2021-07-01: qty 1

## 2021-07-01 MED ORDER — MAGNESIUM SULFATE 2 GM/50ML IV SOLN
2.0000 g | Freq: Once | INTRAVENOUS | Status: AC
  Administered 2021-07-01: 2 g via INTRAVENOUS
  Filled 2021-07-01: qty 50

## 2021-07-01 MED ORDER — DEXTROSE 10 % IV SOLN: INTRAVENOUS | Status: DC

## 2021-07-01 MED ORDER — DEXTROSE 50 % IV SOLN
12.5000 g | Freq: Once | INTRAVENOUS | Status: AC
  Administered 2021-07-01: 12.5 g via INTRAVENOUS
  Filled 2021-07-01: qty 50

## 2021-07-01 MED ORDER — ENOXAPARIN SODIUM 80 MG/0.8ML IJ SOSY
70.0000 mg | PREFILLED_SYRINGE | Freq: Two times a day (BID) | INTRAMUSCULAR | Status: DC
  Administered 2021-07-01 – 2021-07-03 (×4): 70 mg via SUBCUTANEOUS
  Filled 2021-07-01 (×5): qty 0.8

## 2021-07-01 MED ORDER — POTASSIUM CHLORIDE 2 MEQ/ML IV SOLN
INTRAVENOUS | Status: DC
  Filled 2021-07-01 (×2): qty 1000

## 2021-07-01 MED ORDER — PERFLUTREN LIPID MICROSPHERE
1.0000 mL | INTRAVENOUS | Status: AC | PRN
  Administered 2021-07-01: 2 mL via INTRAVENOUS
  Filled 2021-07-01: qty 10

## 2021-07-01 MED ORDER — BUDESONIDE 0.25 MG/2ML IN SUSP
0.2500 mg | Freq: Two times a day (BID) | RESPIRATORY_TRACT | Status: DC
  Administered 2021-07-02: 0.25 mg via RESPIRATORY_TRACT
  Filled 2021-07-01 (×2): qty 2

## 2021-07-01 MED ORDER — LACTASE 3000 UNITS PO TABS
6000.0000 [IU] | ORAL_TABLET | Freq: Three times a day (TID) | ORAL | Status: DC
  Administered 2021-07-02 – 2021-07-05 (×6): 6000 [IU] via ORAL
  Filled 2021-07-01 (×14): qty 2

## 2021-07-01 NOTE — Progress Notes (Signed)
Hypoglycemic Event  CBG: 48  Treatment: D50 25 mL (12.5 gm)  Symptoms: None  Follow-up CBG: SVEX:4600  CBG Result:207  Possible Reasons for Event: Unknown  Comments/MD notified:YES  More ordered were received  Lyndon Code

## 2021-07-01 NOTE — Progress Notes (Addendum)
ANTICOAGULATION CONSULT NOTE  Pharmacy Consult for heparin Indication: pulmonary embolus  Allergies  Allergen Reactions   Ciprofloxacin Rash and Other (See Comments)    SYNCOPE    Lactose Intolerance (Gi) Diarrhea   Shellfish Allergy Anaphylaxis   Sulfa Antibiotics Other (See Comments)    SYNCOPE DIZZINESS    American Cockroach    Cat Hair Extract    Dust Mite Extract    Molds & Smuts    Patient Measurements: Weight: 71.4 kg (157 lb 6.5 oz)  Vital Signs: Temp: 97.8 F (36.6 C) (11/12 0020) Temp Source: Axillary (11/12 0020) BP: 103/75 (11/12 0020) Pulse Rate: 87 (11/12 0020)  Labs: Recent Labs    06/28/21 0907 06/28/21 0925 06/30/21 1002 06/30/21 1004 06/30/21 1430 06/30/21 1433 06/30/21 1443 06/30/21 1822 06/30/21 2213 07/01/21 0138  HGB 15.4 18.7* 14.6  --  15.0  --  16.7  --   --  12.2*  HCT 50.3 55.0* 48.5  --  44.0  --  49.0  --   --  38.5*  PLT 258  --  197  --   --   --   --   --   --  175  APTT  --   --   --   --   --  29  --   --   --   --   LABPROT  --   --   --   --   --  21.4*  --   --   --   --   INR  --   --   --   --   --  1.9*  --   --   --   --   HEPARINUNFRC  --   --   --   --   --   --   --   --   --  0.92*  CREATININE 1.35* 1.20  --  1.23  --   --   --   --   --  1.09  TROPONINIHS  --   --   --   --   --   --   --  24* 20*  --     Estimated Creatinine Clearance: 67.3 mL/min (by C-G formula based on SCr of 1.09 mg/dL).  Medical History: Past Medical History:  Diagnosis Date   Abnormal liver function tests    Arthritis    Boil of buttock    COPD (chronic obstructive pulmonary disease) (HCC)    Chronic Bronchitis.Advair inhaler    Dyspnea    GERD (gastroesophageal reflux disease)    takes Protonix daily   Gout    takes Allopurinol daily   Hemorrhoids    Hypertension    takes HCTZ daily   Hypomagnesemia    Leg ulcer (HCC)    Mild mitral regurgitation    Pancreatitis    10 years ago   Pneumonia 2016   Tricuspid regurgitation     Tubular adenoma of colon    Medications: see MAR  Assessment: 65 yo M with acute bilateral PE extending to all 5 lobes. No RHS noted per radiology read. CBC wnl. No AC PTA.   07/01/21 0138 heparin level: 0.92, above goal. RN reports no s/sx of bleeding or problems with infusion. Will decrease heparin infusion rate appropriately, considering that pt recorded weight has recently changed from 90 kg to 71 kg.  Goal of Therapy:  Heparin level 0.3-0.7 units/ml Monitor platelets by anticoagulation protocol: Yes  Plan:  Decrease heparin infusion to 1300 units/hr Check heparin level in 6 hours and daily while on heparin Continue to monitor CBC daily, and s/sx of bleeding.  Thank you, Chrisandra Carota, PharmD Candidate 07/01/21 3:27 AM

## 2021-07-01 NOTE — Progress Notes (Signed)
  X-cover note: Pt with persistent hypoglycemia. Initially started on D5LR@ 50 ml/hr. Still with hypoglycemia requiring IV D50. Will increase IVF to 100 ml/hr.  Will need to monitor for fluid overload due to reported EF of 25%.  Will give dose of glucagon as well.

## 2021-07-01 NOTE — Progress Notes (Signed)
Initial Nutrition Assessment  DOCUMENTATION CODES:   Not applicable  INTERVENTION:   -6000 mg lactase TID with meals -1 packet Juven BID, each packet provides 95 calories, 2.5 grams of protein (collagen), and 9.8 grams of carbohydrate (3 grams sugar); also contains 7 grams of L-arginine and L-glutamine, 300 mg vitamin C, 15 mg vitamin E, 1.2 mcg vitamin B-12, 9.5 mg zinc, 200 mg calcium, and 1.5 g  Calcium Beta-hydroxy-Beta-methylbutyrate to support wound healing  -Continue 30 ml Prosource Plus TID, each supplement provides 100 kcals and 15 grams protein -Hormel Shake TID with meals, each supplement provides 520 kcals and 22 grams protein -Magic cup TID with meals, each supplement provides 290 kcal and 9 grams of protein  -MVI with minerals daily -Liberalize diet to regular  -D/c Glcuerna -Monitor for goals of care; pt may benefit from enteral nutrition support if he desires full aggressive care  NUTRITION DIAGNOSIS:   Inadequate oral intake related to decreased appetite as evidenced by per patient/family report, percent weight loss.  GOAL:   Patient will meet greater than or equal to 90% of their needs  MONITOR:   PO intake, Supplement acceptance, Labs, Weight trends, Skin, I & O's  REASON FOR ASSESSMENT:   Consult Assessment of nutrition requirement/status, Poor PO  ASSESSMENT:   Stephen Whitaker is a 65 y.o. male with medical history significant of COPD, chronic systolic CHF (EF 61-95%), HTN, recent left BKA on 04/21/21 by dr. Sharol Given, T2DM, SVT, PVD, LUTS/BPH with chronic foley who presented from his SNF due to sudden onset of shortness of breath, hypoxia and hypoglycemia. He can not really tell me why he came except for "not eating for 2-3 weeks." He states he has no appetite. He states he has lost 30+ pounds. (Same weight today as he was in 9/22). He states he was feeling fine last night. He did not eat any dinner. He is no longer altered after glucose was corrected.  Pt  admitted with pulmonary embolism with acute respiratory failure with hypoxia.   Reviewed I/O's: +1.4 L x 24 hours  UOP:650 ml x 24 hours   Pt unavailable at time of visit. Attempted to speak with pt via call to hospital room phone, however, unable to reach. RD unable to obtain further nutrition-related history or complete nutrition-focused physical exam at this time.    Pt familiar to this RD from prior hospitalization. Pt is a selective eater at baseline and requests lactaid with meals secondary to lactose intolerance. He is wary of most supplements and does not tolerate them well (noted RD trialed Ensure Enlive, Glucerna, Boost Breeze, and Ensure Max during prior hospitalization. Pt is most accepting of Magic Cups and Prosource). Case discussed with Dr. Sloan Leiter, who gave RD permission to liberalize diet and order lactase with meals.   Per H&P, pt reports very poor oral intake over the past 2-3 weeks.   Reviewed wt hx; pt has experienced a 21.2% wt loss over the past 3 months, which is significant for time frame.   Pt with history of malnutrition, which RD suspects is ongoing, however, unable to identify at this time.   Palliative care consult pending for goals of care discussions. If within pt's goals of care, pt may benefit from supplemental enteral nutrition support.   Medications reviewed and include vitamin C, remeron, and dextrose 10% infusion @ 40 ml/hr.   Lab Results  Component Value Date   HGBA1C 6.4 (H) 04/16/2021   PTA DM medications are none.   Labs  reviewed: CBGS: 78-115 (inpatient orders for glycemic control are none).    Diet Order:   Diet Order             Diet regular Room service appropriate? Yes with Assist; Fluid consistency: Thin  Diet effective now                   EDUCATION NEEDS:   No education needs have been identified at this time  Skin:  Skin Assessment: Skin Integrity Issues: Skin Integrity Issues:: Stage II Stage II: buttocks  Last BM:   Unknown  Height:   Ht Readings from Last 1 Encounters:  06/30/21 6\' 2"  (1.88 m)    Weight:   Wt Readings from Last 1 Encounters:  07/01/21 71.4 kg   BMI:  Body mass index is 20.21 kg/m.  Estimated Nutritional Needs:   Kcal:  2150-2350  Protein:  105-120 grams  Fluid:  > 2 L    Loistine Chance, RD, LDN, [place] Registered Dietitian II Certified Diabetes Care and Education Specialist Please refer to [facility] for RD and/or RD on-call/weekend/after hours pager

## 2021-07-01 NOTE — Progress Notes (Signed)
Hypoglycemic Event  CBG: 58  Treatment: Glucagon IM 1 mg Gucagon IV was given instead of IM Symptoms: None  Follow-up CBG: [id number] CBG Result:48  Possible Reasons for Event: Inadequate meal intake and Unknown  Comments/MD notified:Yes New orders received    Lyndon Code

## 2021-07-01 NOTE — Progress Notes (Signed)
Hypoglycemic Event  CBG: 2353  Treatment: D50 25 mL (12.5 gm)  Symptoms: None  Follow-up CBG: Time:0030 CBG Result:169  Possible Reasons for Event: Inadequate meal intake  Comments/MD notified:yes   New orders received Lyndon Code

## 2021-07-01 NOTE — Progress Notes (Signed)
Patient glucose level has been unstable and very labile. Dr. Bridgett Larsson on call for Hospitalist has been aware of all the hypoglycemic events throughout the night. Latest blood sugar is 115.

## 2021-07-01 NOTE — Progress Notes (Signed)
ANTICOAGULATION CONSULT NOTE  Pharmacy Consult for heparin to lovenox Indication: pulmonary embolus  Allergies  Allergen Reactions   Ciprofloxacin Rash and Other (See Comments)    SYNCOPE    Lactose Intolerance (Gi) Diarrhea   Shellfish Allergy Anaphylaxis   Sulfa Antibiotics Other (See Comments)    SYNCOPE DIZZINESS    American Cockroach    Cat Hair Extract    Dust Mite Extract    Molds & Smuts    Patient Measurements: Height: 6\' 2"  (188 cm) Weight: 71.4 kg (157 lb 6.5 oz) IBW/kg (Calculated) : 82.2 Heparin dosing weight: 71.4 kg  Vital Signs: Temp: 97.6 F (36.4 C) (11/12 1132) Temp Source: Axillary (11/12 1132) BP: 111/93 (11/12 1132) Pulse Rate: 94 (11/12 1132)  Labs: Recent Labs    06/30/21 1002 06/30/21 1004 06/30/21 1430 06/30/21 1433 06/30/21 1443 06/30/21 1822 06/30/21 2213 07/01/21 0138 07/01/21 1042  HGB 14.6  --  15.0  --  16.7  --   --  12.2*  --   HCT 48.5  --  44.0  --  49.0  --   --  38.5*  --   PLT 197  --   --   --   --   --   --  175  --   APTT  --   --   --  29  --   --   --   --   --   LABPROT  --   --   --  21.4*  --   --   --   --   --   INR  --   --   --  1.9*  --   --   --   --   --   HEPARINUNFRC  --   --   --   --   --   --   --  0.92* 1.01*  CREATININE  --  1.23  --   --   --   --   --  1.09  --   TROPONINIHS  --   --   --   --   --  24* 20*  --   --     Estimated Creatinine Clearance: 67.3 mL/min (by C-G formula based on SCr of 1.09 mg/dL).  Medical History: Past Medical History:  Diagnosis Date   Abnormal liver function tests    Arthritis    Boil of buttock    COPD (chronic obstructive pulmonary disease) (HCC)    Chronic Bronchitis.Advair inhaler    Dyspnea    GERD (gastroesophageal reflux disease)    takes Protonix daily   Gout    takes Allopurinol daily   Hemorrhoids    Hypertension    takes HCTZ daily   Hypomagnesemia    Leg ulcer (HCC)    Mild mitral regurgitation    Pancreatitis    10 years ago    Pneumonia 2016   Tricuspid regurgitation    Tubular adenoma of colon    Medications: see MAR  Assessment: 65 yo M with acute bilateral PE extending to all 5 lobes. No RHS noted per radiology read. CBC wnl. No AC PTA.   07/01/21 0138 heparin level: 0.92, above goal. RN reports no s/sx of bleeding or problems with infusion. Will decrease heparin infusion rate appropriately, considering that pt recorded weight has recently changed from 90 kg to 71 kg.  11/12 1150 heparin level: 1.01, above goal. RN reports no s/sx of bleeding. Will hold heparin for  an hour and the start lovenox. Hgb dropped 4 g/dL and MD contacted, and switch to lovenox was preferred. Since he has consistently been supratherapeutic and had a drop in Hgb, anti-xa order was ordered to ensure level is appropriate.  Goal of Therapy:  Anti-Xa level 0.6-1 units/ml 4hrs after LMWH dose given Monitor platelets by anticoagulation protocol: Yes   Plan:  Held heparin for 1 hr (1200-1300) Stop heparin 1300 units/hr. Start enoxaparin 70 mg SQ q12h. Follow up 4 hr anti-Xa level Continue to monitor CBC daily, and s/sx of bleeding.   Varney Daily, PharmD PGY1 Pharmacy Resident  Please check AMION for all [facility] pharmacy phone numbers After 10:00 PM call main pharmacy [phone]

## 2021-07-01 NOTE — Progress Notes (Signed)
Hypoglycemic Event  CBG: 69  Treatment: 4 oz juice/soda  Symptoms: None  Follow-up CBG: Time:2353 CBG Result:62  Possible Reasons for Event: Inadequate meal intake and Other: refused nutrition supplement  Comments/MD notified:yes  Patient was very slow to drink the applejuice, that is why the blood glucose recheck was delayed.   Lyndon Code

## 2021-07-01 NOTE — Progress Notes (Signed)
Hypoglycemic Event  CBG: 52  Treatment: D50 50 mL (25 gm)  Symptoms: None  Follow-up CBG: Time:1515 CBG Result:65  Possible Reasons for Event: Inadequate meal intake  Comments/MD notified:Ghimire    Venida Jarvis

## 2021-07-01 NOTE — Plan of Care (Signed)

## 2021-07-01 NOTE — Evaluation (Signed)
Clinical/Bedside Swallow Evaluation Patient Details  Name: Stephen Whitaker MRN: [id number] Date of Birth: [birth year]  Today's Date: 07/01/2021 Time: SLP Start Time (ACUTE ONLY): 58 SLP Stop Time (ACUTE ONLY): 1545 SLP Time Calculation (min) (ACUTE ONLY): 24 min  Past Medical History:  Past Medical History:  Diagnosis Date   Abnormal liver function tests    Arthritis    Boil of buttock    COPD (chronic obstructive pulmonary disease) (HCC)    Chronic Bronchitis.Advair inhaler    Dyspnea    GERD (gastroesophageal reflux disease)    takes Protonix daily   Gout    takes Allopurinol daily   Hemorrhoids    Hypertension    takes HCTZ daily   Hypomagnesemia    Leg ulcer (HCC)    Mild mitral regurgitation    Pancreatitis    10 years ago   Pneumonia 2016   Tricuspid regurgitation    Tubular adenoma of colon    Past Surgical History:  Past Surgical History:  Procedure Laterality Date   AMPUTATION Left 07/14/2014   Procedure: 2nd Toe Amputation;  Surgeon: [name], MD;  Location: [facility];  Service: Orthopedics;  Laterality: Left;   AMPUTATION Left 11/16/2015   Procedure: Left Great Toe Amputation at Metatarsophalangeal Joint;  Surgeon: [name], MD;  Location: [place];  Service: Orthopedics;  Laterality: Left;   AMPUTATION Right 06/13/2016   Procedure: RIGHT 2nd Toe Amputation;  Surgeon: [name], MD;  Location: [place];  Service: Orthopedics;  Laterality: Right;   AMPUTATION Left 10/19/2016   Procedure: Amputation Left 3rd Toe;  Surgeon: [name], MD;  Location: [place];  Service: Orthopedics;  Laterality: Left;   AMPUTATION Left 04/21/2021   Procedure: LEFT BELOW KNEE AMPUTATION;  Surgeon: [name], MD;  Location: [facility];  Service: Orthopedics;  Laterality: Left;   RIGHT/LEFT HEART CATH AND CORONARY ANGIOGRAPHY N/A 12/12/2020   Procedure: RIGHT/LEFT HEART CATH AND CORONARY ANGIOGRAPHY;  Surgeon: Lorretta Harp, MD;  Location: [facility];  Service:  Cardiovascular;  Laterality: N/A;   TOE AMPUTATION Left 11/16/2015   left great toe    TOE AMPUTATION Right 06/13/2016   TONSILLECTOMY     HPI:  Pt is a 65 y.o. male who presented from his SNF due to sudden onset of shortness of breath, hypoxia and hypoglycemia. CXR 06/30/21 revealed "Bilateral small to, at most moderate, bilateral pleural effusions. Associated bibasilar atelectasis and/or consolidation". Per MD note (2005), "February 1 he was seen by speech therapy and underwent a modified barium swallow, which was normal". No other images or SLP notes seen in regards to this. PMH: COPD, chronic systolic CHF (EF 35-46%), HTN, recent left BKA on 04/21/21 by dr. Sharol Given, T2DM, SVT, PVD, LUTS/BPH with chronic foley.    Assessment / Plan / Recommendation  Clinical Impression  Limited bedside swallow evaluation able to be completed this date due to pt refusal of POs despite family encouragement. Oral mechanism examination also limited due to poor participation, although no obvious asymmetry noted, question generalized weakness. Thorough oral care provided prior to trials of thin liquids and pureed solids. Pt accepted x1 sip of thin liquids via straw and x2 bites of puree with x1 bite expectorated into washcloth due to refusal. No overt s/sx of aspiration noted with limited trials and swallow initiation appreciated via palpation. Complete oral clearance of x1 consumed bite of puree noted. RN/NT report no overt s/sx of aspiration during meals, although they have observed only  limited intake as well. Recommend continue regular, thin liquid diet at this time. Please reconsult if changes occur. Above discussed with RN and family. Will s/o.  SLP Visit Diagnosis: Dysphagia, unspecified (R13.10)    Aspiration Risk  Mild aspiration risk    Diet Recommendation Regular;Thin liquid   Liquid Administration via: Cup;Straw Medication Administration: Whole meds with liquid Supervision: Staff to assist with self  feeding;Full supervision/cueing for compensatory strategies Compensations: Minimize environmental distractions;Slow rate;Small sips/bites Postural Changes: Seated upright at 90 degrees;Remain upright for at least 30 minutes after po intake    Other  Recommendations Oral Care Recommendations: Oral care BID;Staff/trained caregiver to provide oral care    Recommendations for follow up therapy are one component of a multi-disciplinary discharge planning process, led by the attending physician.  Recommendations may be updated based on patient status, additional functional criteria and insurance authorization.  Follow up Recommendations No SLP follow up      Assistance Recommended at Discharge    Functional Status Assessment    Frequency and Duration            Prognosis Prognosis for Safe Diet Advancement: Good Barriers to Reach Goals: Motivation      Swallow Study   General Date of Onset: 07/01/21 HPI: Pt is a 65 y.o. male who presented from his SNF due to sudden onset of shortness of breath, hypoxia and hypoglycemia. CXR 06/30/21 revealed "Bilateral small to, at most moderate, bilateral pleural effusions. Associated bibasilar atelectasis and/or consolidation". Per MD note (2005), "February 1 he was seen by speech therapy and underwent a modified barium swallow, which was normal". No other images or SLP notes seen in regards to this. PMH: COPD, chronic systolic CHF (EF 09-98%), HTN, recent left BKA on 04/21/21 by dr. Sharol Given, T2DM, SVT, PVD, LUTS/BPH with chronic foley. Type of Study: Bedside Swallow Evaluation Previous Swallow Assessment: see HPI Diet Prior to this Study: Regular;Thin liquids Temperature Spikes Noted: No Respiratory Status: Room air History of Recent Intubation: No Behavior/Cognition: Alert;Uncooperative Oral Cavity Assessment: Within Functional Limits Oral Care Completed by SLP: Yes Oral Cavity - Dentition: Adequate natural dentition;Poor condition;Missing  dentition Vision: Functional for self-feeding Self-Feeding Abilities: Needs assist;Able to feed self Patient Positioning: Upright in bed;Postural control adequate for testing Baseline Vocal Quality: Normal    Oral/Motor/Sensory Function Overall Oral Motor/Sensory Function: Generalized oral weakness   Ice Chips Ice chips: Not tested   Thin Liquid Thin Liquid: Within functional limits Presentation: Straw;Self Fed    Nectar Thick Nectar Thick Liquid: Not tested   Honey Thick Honey Thick Liquid: Not tested   Puree Puree: Within functional limits Presentation: Spoon   Solid     Solid: Not tested Other Comments: pt refused     Ellwood Dense, [place], [place] Office Number: [phone]  Acie Fredrickson 07/01/2021,4:03 PM

## 2021-07-01 NOTE — Progress Notes (Signed)
   X-cover note: Admitted for hypoglycemia. Repeated bouts of hypoglycemia since admission. Required change of IVF to D5 LR @ 50 ml/hr. Increased to 100 ml/hr. Still hypoglycemic. Received 2 doses of IV glucagon 1 mg each dose. Still hypoglycemic. IVF changed to D10 0.45% @ 75 ml/hr. Still hypoglycemic. Gave 100 mg IV hydrocortisone. CBG finally above 100.  Kristopher Oppenheim, DO

## 2021-07-01 NOTE — Progress Notes (Signed)
  Echocardiogram 2D Echocardiogram with contrast has been performed.  Stephen Whitaker F 07/01/2021, 5:26 PM

## 2021-07-01 NOTE — Progress Notes (Signed)
PROGRESS NOTE        PATIENT DETAILS Name: Stephen Whitaker Age: 65 y.o. Sex: male Date of Birth: 20-Dec-1955 Admit Date: 06/30/2021 Admitting Physician Orma Flaming, MD [id number], [name], NP  Brief Narrative: Patient is a 65 y.o. male with history of HFrEF, PAD-recent left BKA (04/21/2021), HTN, DM-2, BPH-with chronic indwelling Foley catheter, COPD-transferred to the ED from SNF for shortness of breath, hypoglycemia-patient was found to have hypoxia due to PE and subsequently admitted to the hospitalist service.  See below for further details.  Subjective: Looks incredibly frail-emaciated-awake-follows commands but not very verbal otherwise.  Objective: Vitals: Blood pressure 108/88, pulse 98, temperature 97.6 F (36.4 C), temperature source Oral, resp. rate (!) 24, height 6\' 2"  (1.88 m), weight 71.4 kg, SpO2 97 %.   Exam: Gen Exam: Frail/cachectic appearing-not in any distress.  Chronically sick appearing. HEENT:atraumatic, normocephalic Chest: B/L clear to auscultation anteriorly CVS:S1S2 regular Abdomen:soft non tender, non distended Extremities: S/p left BKA-stump appears unremarkable. Neurology: Non focal-but with severe generalized weakness. Skin: no rash  Pertinent Labs/Radiology: Recent Labs  Lab 06/28/21 0907 06/28/21 0925 06/30/21 1004 06/30/21 1430 07/01/21 0138  WBC 4.8   < >  --   --  5.6  HGB 15.4   < >  --    < > 12.2*  PLT 258   < >  --   --  175  NA 136   < > 134*   < > 134*  K 5.4*   < > 4.4   < > 3.9  CREATININE 1.35*   < > 1.23  --  1.09  AST 25  --  28  --  23  ALT 10  --  10  --  10  ALKPHOS 189*  --  189*  --  147*  BILITOT 1.6*  --  1.6*  --  1.5*   < > = values in this interval not displayed.    11/09>> urine culture: Citrobacter/Pseudomonas 11/11>>Blood culture: Pending 11/11>>Urine Culture: Pending  04/21>> Echo: EF 03-47%, RV systolic function reduced, RVSP 54.7. 11/11>> CT angio chest: Bilateral  distal/central/segmental/subsegmental PE in all 5 lobes.  Left PNA.  Assessment/Plan: Acute hypoxic respiratory failure due to PE and aspiration pneumonia: Requiring around 3-4 L of oxygen to maintain O2 saturations-on IV heparin and empiric IV antimicrobial therapy.  Continue to treat underlying pathologies-an attempt to titrate off oxygen as tolerated.  Pulmonary embolism: Mild hypoxemia but otherwise stable.  Suspect provoked PE in the setting of recent surgery/mostly bedbound status-continue IV heparin.  Await echo/lower extremity Doppler.  Sepsis due to left-sided pneumonia: Sepsis physiology resolving--suspect aspiration given severe frailty/deconditioning.  Continue IV cefepime-do not think patient requires vancomycin-most recent MRSA PCR was negative.  Follow culture data.  We will continue to follow clinical course and narrow antimicrobial spectrum accordingly.  Hypoglycemia: Probably from poor oral intake-not clear if these hypoglycemic readings from fingersticks are accurate.  Reviewed prior notes from his most recent hospitalization-patient has Raynaud's phenomena and very poor circulation-in the past fingersticks did not correlate with serum blood glucose levels.  Plan is to encourage oral intake-and titrate down D10 infusion as tolerated.  HFrEF: Compensated-not in exacerbation-given soft BP-hold all diuretics/beta-blocker/Entresto  History of SVT: Continue telemetry monitoring-we will attempt to restart beta-blocker when BP tolerates   COPD: Not in exacerbation-continue bronchodilators  History of DM-2 (A1c 6.4):  Given frailty/episodes of hypoglycemia-does not need any treatment for his DM-2.  Plan is to tolerate some amount of hyperglycemia once hypoglycemia has resolved.  Asymptomatic bacteriuria: Urine culture positive for Citrobacter/Proteus-has chronic indwelling Foley-he does not appear to have clinical features consistent with UTI-in any event he is on appropriate IV  antibiotics for cefepime that should cover the urine in case he does have a UTI.  PAD-s/p left BKA September 2022: Stump site appears unremarkable  History of BPH with chronic indwelling Foley catheter  GERD: Continue PPI  Failure to thrive syndrome/cachexia/deconditioning/debility: Appears to be incredibly frail-cachectic-claims to have poor oral intake for the past several weeks.  His overall long-term prognosis appears to be very grim.  I have consulted palliative care for goals of care discussion.  I have reached out to patient's son-Long discussion-he understands poor long-term prognosis-I have recommended a DNR-and transitioning to comfort care if he were to decompensate further.  Patient's son understands the tenuous clinical situation-but wants to reach out to several other family members-and will get back to [place].  Pressure ulcer: Present prior to admission-awaiting wound care evaluation. Pressure Injury 04/16/21 Buttocks Left Stage 2 -  Partial thickness loss of dermis presenting as a shallow open injury with a red, pink wound bed without slough. (Active)  04/16/21 2302  Location: Buttocks  Location Orientation: Left  Staging: Stage 2 -  Partial thickness loss of dermis presenting as a shallow open injury with a red, pink wound bed without slough.  Wound Description (Comments):   Present on Admission: Yes     Pressure Injury 06/30/21 Buttocks Mid Stage 2 -  Partial thickness loss of dermis presenting as a shallow open injury with a red, pink wound bed without slough. tiny open area mid of buttocks(coccyx); surrounding area denuded (Active)  06/30/21 2200  Location: Buttocks  Location Orientation: Mid  Staging: Stage 2 -  Partial thickness loss of dermis presenting as a shallow open injury with a red, pink wound bed without slough.  Wound Description (Comments): tiny open area mid of buttocks(coccyx); surrounding area denuded  Present on Admission: Yes    BMI Estimated body mass  index is 20.21 kg/m as calculated from the following:   Height as of this encounter: 6\' 2"  (1.88 m).   Weight as of this encounter: 71.4 kg.     Procedures: None Consults: Palliative DVT Prophylaxis: IV Heparin Code Status:Full code Family Communication: Son-Tyler-[phone]-updated over the phone-see above discussion.  Time spent: 45 minutes-Greater than 50% of this time was spent in counseling, explanation of diagnosis, planning of further management, and coordination of care.   Disposition Plan: Status is: Inpatient  Remains inpatient appropriate because: Hypoxia due to PE/pneumonia/hypoglycemia/severe failure to thrive syndrome/poor oral intake-not stable for discharge.  On IV heparin/IV antibiotics with   Diet: Diet Order             Diet 2 gram sodium Room service appropriate? Yes; Fluid consistency: Thin  Diet effective now                     Antimicrobial agents: Anti-infectives (From admission, onward)    Start     Dose/Rate Route Frequency Ordered Stop   07/01/21 0600  vancomycin (VANCOREADY) IVPB 1000 mg/200 mL        1,000 mg 200 mL/hr over 60 Minutes Intravenous Every 12 hours 06/30/21 1957     07/01/21 0030  ceFEPIme (MAXIPIME) 2 g in sodium chloride 0.9 % 100 mL IVPB  2 g 200 mL/hr over 30 Minutes Intravenous Every 8 hours 06/30/21 1957     07/01/21 0000  metroNIDAZOLE (FLAGYL) IVPB 500 mg  Status:  Discontinued        500 mg 100 mL/hr over 60 Minutes Intravenous Every 12 hours 06/30/21 1954 06/30/21 1955   06/30/21 1615  ceFEPIme (MAXIPIME) 2 g in sodium chloride 0.9 % 100 mL IVPB        2 g 200 mL/hr over 30 Minutes Intravenous  Once 06/30/21 1609 06/30/21 1719   06/30/21 1615  metroNIDAZOLE (FLAGYL) IVPB 500 mg        500 mg 100 mL/hr over 60 Minutes Intravenous  Once 06/30/21 1609 06/30/21 1730   06/30/21 1615  vancomycin (VANCOREADY) IVPB 1750 mg/350 mL        1,750 mg 175 mL/hr over 120 Minutes Intravenous  Once 06/30/21 1609  06/30/21 1938        MEDICATIONS: Scheduled Meds:  (feeding supplement) PROSource Plus  30 mL Oral TID BM   vitamin C  1,000 mg Oral Daily   Chlorhexidine Gluconate Cloth  6 each Topical Daily   dextrose       dextrose       feeding supplement (GLUCERNA SHAKE)  237 mL Oral TID BM   fluticasone  1 spray Each Nare Daily   mirtazapine  15 mg Oral QHS   mometasone-formoterol  2 puff Inhalation BID   montelukast  10 mg Oral QHS   multivitamin with minerals  1 tablet Oral Daily   pantoprazole  40 mg Oral BID   sterile water (preservative free)       tamsulosin  0.4 mg Oral QPC supper   Continuous Infusions:  ceFEPime (MAXIPIME) IV 2 g (06/30/21 2344)   D-10-0.45% Sodium Chloride with KCL 1000 ml 75 mL/hr at 07/01/21 0340   dextrose Stopped (07/01/21 0340)   heparin 1,300 Units/hr (07/01/21 0335)   vancomycin 1,000 mg (07/01/21 0611)   PRN Meds:.acetaminophen **OR** acetaminophen, albuterol, azelastine, HYDROcodone-acetaminophen, loperamide, ondansetron **OR** ondansetron (ZOFRAN) IV   I have personally reviewed following labs and imaging studies  LABORATORY DATA: CBC: Recent Labs  Lab 06/28/21 0907 06/28/21 0925 06/30/21 1002 06/30/21 1430 06/30/21 1443 07/01/21 0138  WBC 4.8  --  5.1  --   --  5.6  NEUTROABS 3.6  --  3.7  --   --   --   HGB 15.4 18.7* 14.6 15.0 16.7 12.2*  HCT 50.3 55.0* 48.5 44.0 49.0 38.5*  MCV 89.5  --  92.7  --   --  88.1  PLT 258  --  197  --   --  270    Basic Metabolic Panel: Recent Labs  Lab 06/28/21 0907 06/28/21 0925 06/30/21 1004 06/30/21 1430 06/30/21 1443 06/30/21 2213 07/01/21 0138  NA 136 138 134* 136 136  --  134*  K 5.4* 5.4* 4.4 4.4 4.3  --  3.9  CL 99 103 102  --   --   --  101  CO2 24  --  19*  --   --   --  23  GLUCOSE 112* 107* 102*  --   --   --  146*  BUN 56* 67* 56*  --   --   --  56*  CREATININE 1.35* 1.20 1.23  --   --   --  1.09  CALCIUM 9.1  --  8.8*  --   --   --  8.3*  MG  --   --   --   --   --  2.2  --      GFR: Estimated Creatinine Clearance: 67.3 mL/min (by C-G formula based on SCr of 1.09 mg/dL).  Liver Function Tests: Recent Labs  Lab 06/28/21 0907 06/30/21 1004 07/01/21 0138  AST 25 28 23   ALT 10 10 10   ALKPHOS 189* 189* 147*  BILITOT 1.6* 1.6* 1.5*  PROT 7.9 7.6 6.4*  ALBUMIN 2.4* 2.4* 2.0*   Recent Labs  Lab 06/28/21 0907 06/30/21 1822  LIPASE 43 44   No results for input(s): AMMONIA in the last 168 hours.  Coagulation Profile: Recent Labs  Lab 06/30/21 1433  INR 1.9*    Cardiac Enzymes: No results for input(s): CKTOTAL, CKMB, CKMBINDEX, TROPONINI in the last 168 hours.  BNP (last 3 results) Recent Labs    10/24/20 1517 01/24/21 1106 02/22/21 1158  PROBNP 7,418* 22,081* 20,322*    Lipid Profile: No results for input(s): CHOL, HDL, LDLCALC, TRIG, CHOLHDL, LDLDIRECT in the last 72 hours.  Thyroid Function Tests: No results for input(s): TSH, T4TOTAL, FREET4, T3FREE, THYROIDAB in the last 72 hours.  Anemia Panel: No results for input(s): VITAMINB12, FOLATE, FERRITIN, TIBC, IRON, RETICCTPCT in the last 72 hours.  Urine analysis:    Component Value Date/Time   COLORURINE AMBER (A) 06/30/2021 0954   APPEARANCEUR CLOUDY (A) 06/30/2021 0954   APPEARANCEUR Clear 01/23/2017 1650   LABSPEC 1.029 06/30/2021 0954   PHURINE 5.0 06/30/2021 0954   GLUCOSEU NEGATIVE 06/30/2021 0954   HGBUR LARGE (A) 06/30/2021 0954   BILIRUBINUR NEGATIVE 06/30/2021 0954   BILIRUBINUR Negative 01/23/2017 [place] 06/30/2021 0954   PROTEINUR 100 (A) 06/30/2021 0954   UROBILINOGEN 1.0 01/31/2021 2054   NITRITE NEGATIVE 06/30/2021 0954   LEUKOCYTESUR LARGE (A) 06/30/2021 0954    Sepsis Labs: Lactic Acid, Venous    Component Value Date/Time   LATICACIDVEN 6.2 ([place]) 06/30/2021 1822    MICROBIOLOGY: Recent Results (from the past 240 hour(s))  Urine Culture     Status: Abnormal   Collection Time: 06/28/21  9:13 AM   Specimen: Urine, Catheterized   Result Value Ref Range Status   Specimen Description URINE, CATHETERIZED  Final   Special Requests   Final    NONE Performed at [facility], [address], [place], [place] [address]    Culture (A)  Final    >=100,000 COLONIES/mL CITROBACTER FREUNDII >=100,000 COLONIES/mL PSEUDOMONAS AERUGINOSA    Report Status 06/30/2021 FINAL  Final   Organism ID, Bacteria CITROBACTER FREUNDII (A)  Final   Organism ID, Bacteria PSEUDOMONAS AERUGINOSA (A)  Final      Susceptibility   Citrobacter freundii - MIC*    CEFAZOLIN >=64 RESISTANT Resistant     CEFEPIME <=0.12 SENSITIVE Sensitive     CEFTRIAXONE <=0.25 SENSITIVE Sensitive     CIPROFLOXACIN 1 RESISTANT Resistant     GENTAMICIN <=1 SENSITIVE Sensitive     IMIPENEM <=0.25 SENSITIVE Sensitive     NITROFURANTOIN <=16 SENSITIVE Sensitive     TRIMETH/SULFA >=320 RESISTANT Resistant     PIP/TAZO <=4 SENSITIVE Sensitive     * >=100,000 COLONIES/mL CITROBACTER FREUNDII   Pseudomonas aeruginosa - MIC*    CEFTAZIDIME 4 SENSITIVE Sensitive     CIPROFLOXACIN <=0.25 SENSITIVE Sensitive     GENTAMICIN <=1 SENSITIVE Sensitive     IMIPENEM <=0.25 SENSITIVE Sensitive     PIP/TAZO 8 SENSITIVE Sensitive     CEFEPIME 4 SENSITIVE Sensitive     * >=100,000 COLONIES/mL PSEUDOMONAS AERUGINOSA  Resp Panel by RT-PCR (Flu A&B, Covid)  Nasopharyngeal Swab     Status: None   Collection Time: 06/30/21  2:00 PM   Specimen: Nasopharyngeal Swab; Nasopharyngeal(NP) swabs in vial transport medium  Result Value Ref Range Status   SARS Coronavirus 2 by RT PCR NEGATIVE NEGATIVE Final    Comment: (NOTE) SARS-CoV-2 target nucleic acids are NOT DETECTED.  The SARS-CoV-2 RNA is generally detectable in upper respiratory specimens during the acute phase of infection. The lowest concentration of SARS-CoV-2 viral copies this assay can detect is 138 copies/mL. A negative result does not preclude SARS-Cov-2 infection and should not be used as the sole basis for  treatment or other patient management decisions. A negative result may occur with  improper specimen collection/handling, submission of specimen other than nasopharyngeal swab, presence of viral mutation(s) within the areas targeted by this assay, and inadequate number of viral copies(<138 copies/mL). A negative result must be combined with clinical observations, patient history, and epidemiological information. The expected result is Negative.  Fact Sheet for Patients:  EntrepreneurPulse.com.au  Fact Sheet for Healthcare Providers:  IncredibleEmployment.be  This test is no t yet approved or cleared by the [place] FDA and  has been authorized for detection and/or diagnosis of SARS-CoV-2 by FDA under an Emergency Use Authorization (EUA). This EUA will remain  in effect (meaning this test can be used) for the duration of the COVID-19 declaration under Section 564(b)(1) of the Act, 21 U.S.C.section 360bbb-3(b)(1), unless the authorization is terminated  or revoked sooner.       Influenza A by PCR NEGATIVE NEGATIVE Final   Influenza B by PCR NEGATIVE NEGATIVE Final    Comment: (NOTE) The Xpert Xpress SARS-CoV-2/FLU/RSV plus assay is intended as an aid in the diagnosis of influenza from Nasopharyngeal swab specimens and should not be used as a sole basis for treatment. Nasal washings and aspirates are unacceptable for Xpert Xpress SARS-CoV-2/FLU/RSV testing.  Fact Sheet for Patients: EntrepreneurPulse.com.au  Fact Sheet for Healthcare Providers: IncredibleEmployment.be  This test is not yet approved or cleared by the [place] FDA and has been authorized for detection and/or diagnosis of SARS-CoV-2 by FDA under an Emergency Use Authorization (EUA). This EUA will remain in effect (meaning this test can be used) for the duration of the COVID-19 declaration under Section 564(b)(1) of the Act, 21  U.S.C. section 360bbb-3(b)(1), unless the authorization is terminated or revoked.  Performed at [facility], [place] [address], [place], [place] [address]   Blood Culture (routine x 2)     Status: None (Preliminary result)   Collection Time: 06/30/21  6:19 PM   Specimen: BLOOD RIGHT FOREARM  Result Value Ref Range Status   Specimen Description BLOOD RIGHT FOREARM  Final   Special Requests   Final    BOTTLES DRAWN AEROBIC AND ANAEROBIC Blood Culture results may not be optimal due to an inadequate volume of blood received in culture bottles   Culture   Final    NO GROWTH < 12 HOURS Performed at [facility], [place] [address] [place], [place] [address]    Report Status PENDING  Incomplete  MRSA Next Gen by PCR, Nasal     Status: None   Collection Time: 07/01/21  1:44 AM   Specimen: Nasal Mucosa; Nasal Swab  Result Value Ref Range Status   MRSA by PCR Next Gen NOT DETECTED NOT DETECTED Final    Comment: (NOTE) The GeneXpert MRSA Assay (FDA approved for NASAL specimens only), is one component of a comprehensive MRSA colonization surveillance  program. It is not intended to diagnose MRSA infection nor to guide or monitor treatment for MRSA infections. Test performance is not FDA approved in patients less than 24 years old. Performed at [facility], [place] [address] [place], [place] [address]     RADIOLOGY STUDIES/RESULTS: CT Angio Chest PE W/Cm &/Or Wo Cm  Result Date: 06/30/2021 CLINICAL DATA:  PE suspected, high prob EXAM: CT ANGIOGRAPHY CHEST WITH CONTRAST TECHNIQUE: Multidetector CT imaging of the chest was performed using the standard protocol during bolus administration of intravenous contrast. Multiplanar CT image reconstructions and MIPs were obtained to evaluate the vascular anatomy. CONTRAST:  46mL OMNIPAQUE IOHEXOL 350 MG/ML SOLN COMPARISON:  None. FINDINGS: Cardiovascular: Satisfactory opacification of the pulmonary arteries to the segmental level. No  evidence of pulmonary embolism. Cardiomegaly with no definite increased right to left ventricular ratio. Retrograde reflux of intravenous contrast within the inferior vena cava and hepatic veins likely due to timing of contrast given no contrast noted within the left heart. No significant pericardial effusion. The thoracic aorta is normal in caliber. Mild atherosclerotic plaque of the thoracic aorta. No coronary artery calcifications. Mediastinum/Nodes: No enlarged mediastinal, hilar, or axillary lymph nodes. Thyroid gland, trachea, and esophagus demonstrate no significant findings. Lungs/Pleura: Partial collapse of bilateral lower lobes. No focal consolidation. No pulmonary nodule. No pulmonary mass. Bilateral small to moderate pleural effusions. No pneumothorax. Upper Abdomen: No acute abnormality. Musculoskeletal: No chest wall abnormality. No suspicious lytic or blastic osseous lesions. No acute displaced fracture. Multilevel degenerative changes of the spine. Review of the MIP images confirms the above findings. IMPRESSION: 1. Bilateral distal central, segmental, subsegmental pulmonary emboli extending to all five lobes. No definite findings of right heart strain. Cannot rule out a developing pulmonary infarction within left lower lobe. 2. Bilateral small to moderate pleural effusions. 3. Partially collapsed bilateral lower lobes with bubbly ground-glass airspace opacities within the left lower lobe. Left lower lobe findings could represent infection/inflammation versus atelectasis versus developing pulmonary infarction. Recommend follow-up CT in 3 months to evaluate for resolution. These results were called by telephone at the time of interpretation on 06/30/2021 at 5:25 pm to provider Dr. Sabra Heck, who verbally acknowledged these results. Electronically Signed   By: Iven Finn M.D.   On: 06/30/2021 17:33   DG Chest Port 1 View  Result Date: 06/30/2021 CLINICAL DATA:  Hypoxia.  Additional history  provided: Hypoglycemia. EXAM: PORTABLE CHEST 1 VIEW COMPARISON:  Prior chest radiographs 06/28/2021 and earlier. FINDINGS: Cardiomegaly, unchanged. Aortic atherosclerosis. Hazy opacity within the mid to lower lung fields bilaterally compatible with small to, at most moderate, bilateral pleural effusions. Associated bibasilar atelectasis and/or consolidation. No evidence of pneumothorax. No acute bony abnormality identified. IMPRESSION: Cardiomegaly, unchanged. Bilateral small to, at most moderate, bilateral pleural effusions. Associated bibasilar atelectasis and/or consolidation. Aortic Atherosclerosis (ICD10-I70.0). Electronically Signed   By: Kellie Simmering D.O.   On: 06/30/2021 14:42   DG Tibia/Fibula Left Port  Result Date: 06/30/2021 CLINICAL DATA:  Sepsis. EXAM: PORTABLE LEFT TIBIA AND FIBULA - 2 VIEW COMPARISON:  None. FINDINGS: Left below-knee amputation present. No acute fracture or dislocation. No cortical erosion. Soft tissues are within normal limits. Joint spaces are well maintained. Peripheral vascular calcifications are present. IMPRESSION: 1. Left below the knee amputation. 2. No acute bony abnormality. Electronically Signed   By: Ronney Asters M.D.   On: 06/30/2021 19:36     LOS: 1 day   Oren Binet, MD  [facility]    To contact the attending  provider between 7A-7P or the covering provider during after hours 7P-7A, please log into the web site [email or web address] and access using universal [place] password for that web site. If you do not have the password, please call the hospital operator.  07/01/2021, 9:18 AM

## 2021-07-01 NOTE — Progress Notes (Addendum)
Hypoglycemic Event  CBG: 62 Treatment: D50 25 mL (12.5 gm)  Symptoms: None  Follow-up CBG: Time:0030 CBG Result:169  Possible Reasons for Event: Unknown  Comments/MD notified:Yes  Received orders to give  GLucagon  [place] Code

## 2021-07-02 ENCOUNTER — Encounter (HOSPITAL_COMMUNITY): Payer: BC Managed Care – PPO

## 2021-07-02 DIAGNOSIS — Z7189 Other specified counseling: Secondary | ICD-10-CM

## 2021-07-02 DIAGNOSIS — F4321 Adjustment disorder with depressed mood: Secondary | ICD-10-CM

## 2021-07-02 DIAGNOSIS — R4182 Altered mental status, unspecified: Secondary | ICD-10-CM

## 2021-07-02 LAB — GLUCOSE, CAPILLARY
Glucose-Capillary: 101 mg/dL — ABNORMAL HIGH (ref 70–99)
Glucose-Capillary: 111 mg/dL — ABNORMAL HIGH (ref 70–99)
Glucose-Capillary: 118 mg/dL — ABNORMAL HIGH (ref 70–99)
Glucose-Capillary: 128 mg/dL — ABNORMAL HIGH (ref 70–99)
Glucose-Capillary: 130 mg/dL — ABNORMAL HIGH (ref 70–99)
Glucose-Capillary: 133 mg/dL — ABNORMAL HIGH (ref 70–99)
Glucose-Capillary: 191 mg/dL — ABNORMAL HIGH (ref 70–99)
Glucose-Capillary: 192 mg/dL — ABNORMAL HIGH (ref 70–99)
Glucose-Capillary: 22 mg/dL — CL (ref 70–99)
Glucose-Capillary: 27 mg/dL — CL (ref 70–99)
Glucose-Capillary: 92 mg/dL (ref 70–99)
Glucose-Capillary: 95 mg/dL (ref 70–99)
Glucose-Capillary: >600 mg/dL (ref 70–99)

## 2021-07-02 LAB — COMPREHENSIVE METABOLIC PANEL
ALT: UNDETERMINED U/L (ref 0–44)
AST: UNDETERMINED U/L (ref 15–41)
Albumin: 2 g/dL — ABNORMAL LOW (ref 3.5–5.0)
Alkaline Phosphatase: 127 U/L — ABNORMAL HIGH (ref 38–126)
Anion gap: 13 (ref 5–15)
BUN: 53 mg/dL — ABNORMAL HIGH (ref 8–23)
CO2: 17 mmol/L — ABNORMAL LOW (ref 22–32)
Calcium: 8 mg/dL — ABNORMAL LOW (ref 8.9–10.3)
Chloride: 102 mmol/L (ref 98–111)
Creatinine, Ser: 1.04 mg/dL (ref 0.61–1.24)
GFR, Estimated: >60 mL/min (ref >60)
Glucose, Bld: 101 mg/dL — ABNORMAL HIGH (ref 70–99)
Potassium: 4.9 mmol/L (ref 3.5–5.1)
Sodium: 132 mmol/L — ABNORMAL LOW (ref 135–145)
Total Bilirubin: UNDETERMINED mg/dL (ref 0.3–1.2)
Total Protein: 6.3 g/dL — ABNORMAL LOW (ref 6.5–8.1)

## 2021-07-02 LAB — CBC
HCT: 43.6 % (ref 39.0–52.0)
Hemoglobin: 13.4 g/dL (ref 13.0–17.0)
MCH: 27.5 pg (ref 26.0–34.0)
MCHC: 30.7 g/dL (ref 30.0–36.0)
MCV: 89.3 fL (ref 80.0–100.0)
Platelets: 157 10*3/uL (ref 150–400)
RBC: 4.88 MIL/uL (ref 4.22–5.81)
RDW: 20.2 % — ABNORMAL HIGH (ref 11.5–15.5)
WBC: 7.8 10*3/uL (ref 4.0–10.5)
nRBC: 1.5 % — ABNORMAL HIGH (ref 0.0–0.2)

## 2021-07-02 LAB — PROCALCITONIN: Procalcitonin: 4.29 ng/mL

## 2021-07-02 MED ORDER — CARVEDILOL 3.125 MG PO TABS
3.1250 mg | ORAL_TABLET | Freq: Two times a day (BID) | ORAL | Status: DC
  Administered 2021-07-02 – 2021-07-03 (×2): 3.125 mg via ORAL
  Filled 2021-07-02 (×2): qty 1

## 2021-07-02 MED ORDER — MIDODRINE HCL 5 MG PO TABS
5.0000 mg | ORAL_TABLET | Freq: Three times a day (TID) | ORAL | Status: DC
  Administered 2021-07-02 – 2021-07-03 (×3): 5 mg via ORAL
  Filled 2021-07-02 (×3): qty 1

## 2021-07-02 NOTE — Consult Note (Signed)
WOC Nurse Consult Note: Reason for Consult:Stage 2 pressure injury to sacrum, POA Wound type:pressure v pressure plus moisture Pressure Injury POA: Yes Measurement:1cm x 0.5cm x 0.1cm Wound SUP:JSRP, moist, painful Drainage (amount, consistency, odor) scant serous Periwound:intact, moist Dressing procedure/placement/frequency: A POC for the area is provided using a wound contact layer of xeroform gauze (antimicrobial, nonadherent) covered with a dry gauze 2x2 and topped with a silicone foam dressing for the sacrum. Turning and repositioning is in place and time in the supine position is to be minimized. As patient has had a left BKA, a right pressure redistribution heel boot is provided.   [place] nursing team will not follow, but will remain available to this patient, the nursing and medical teams.  Please re-consult if needed. Thanks, Maudie Flakes, MSN, RN, [place], Arther Abbott  Pager# [phone]

## 2021-07-02 NOTE — Progress Notes (Signed)
Pt fingerstick CBG consistently reading critically low on left hand, however, when rechecked on right hand, CBG is reading normal/elevated.   Q1H CBG have been continued overnight. Pt CBG have been WNL for at least the past 8 hours.   MD notified with update and clarification of on-going CBG orders requested due to multiple active orders currently present.   Pt continues to refuse PO intake. D10 infusing @ 40/hr. Pt refusing majority of care and medications, stating he is in [place] where he wants to be and he does not need medications. VS have been stable overnight.   MD responded to proceed with q2h CBG at this time.

## 2021-07-02 NOTE — Plan of Care (Signed)
Family conference today decision made to transition pt to DNR and focus on quality of care. Pt able to eat his dinner 50% with family at bedside. He refused some of his meds today. Downgraded to telemetry. Glucose stabilized around 100's. Dextrose rate changed to 30cc/hr. Bmx2. Foley in place.    Problem: Activity: Goal: Risk for activity intolerance will decrease Outcome: Not Progressing   Problem: Nutrition: Goal: Adequate nutrition will be maintained Outcome: Progressing   Problem: Coping: Goal: Level of anxiety will decrease Outcome: Progressing   Problem: Elimination: Goal: Will not experience complications related to bowel motility Outcome: Progressing Goal: Will not experience complications related to urinary retention Outcome: Progressing   Problem: Pain Managment: Goal: General experience of comfort will improve Outcome: Progressing

## 2021-07-02 NOTE — Progress Notes (Signed)
PROGRESS NOTE        PATIENT DETAILS Name: Stephen Whitaker Age: 65 y.o. Sex: male Date of Birth: 27-Jun-1956 Admit Date: 06/30/2021 Admitting Physician Orma Flaming, MD ZOX:WRUEAVW, [name], NP  Brief Narrative: Patient is a 65 y.o. male with history of HFrEF, PAD-recent left BKA (04/21/2021), HTN, DM-2, BPH-with chronic indwelling Foley catheter, COPD-transferred to the ED from SNF for shortness of breath, hypoglycemia-patient was found to have hypoxia due to PE and subsequently admitted to the hospitalist service.  See below for further details.  Subjective: Refusing to eat-continues to have low blood sugars.  Awake/alert.  Objective: Vitals: Blood pressure (!) 116/98, pulse 97, temperature 98 F (36.7 C), temperature source Axillary, resp. rate 14, height 6\' 2"  (1.88 m), weight 71 kg, SpO2 91 %.   Exam: Gen Exam: Looks emaciated-chronically sick appearing.  Not in distress. HEENT:atraumatic, normocephalic Chest: B/L clear to auscultation anteriorly CVS:S1S2 regular Abdomen:soft non tender, non distended Extremities: Right BKA. Neurology: Non focal Skin: no rash   Pertinent Labs/Radiology: Recent Labs  Lab 06/28/21 0907 06/28/21 0925 06/30/21 1004 06/30/21 1430 07/01/21 0138 07/02/21 0158  WBC 4.8   < >  --   --  5.6 7.8  HGB 15.4   < >  --    < > 12.2* 13.4  PLT 258   < >  --   --  175 157  NA 136   < > 134*   < > 134* 132*  K 5.4*   < > 4.4   < > 3.9 4.9  CREATININE 1.35*   < > 1.23  --  1.09 1.04  AST 25  --  28  --  23 QUANTITY NOT SUFFICIENT, UNABLE TO PERFORM TEST  ALT 10  --  10  --  10 QUANTITY NOT SUFFICIENT, UNABLE TO PERFORM TEST  ALKPHOS 189*  --  189*  --  147* 127*  BILITOT 1.6*  --  1.6*  --  1.5* QUANTITY NOT SUFFICIENT, UNABLE TO PERFORM TEST   < > = values in this interval not displayed.     11/09>> urine culture: Citrobacter/Pseudomonas 11/11>>Blood culture: No growth 11/11>>Urine Culture: Gram-negative  rods  04/21>> Echo: EF 09-81%, RV systolic function reduced, RVSP 54.7. 11/11>> CT angio chest: Bilateral distal/central/segmental/subsegmental PE in all 5 lobes.  Left PNA. 11/12>> Echo: EF<20%, multiple LV thrombus.  Assessment/Plan: Acute hypoxic respiratory failure due to PE and aspiration pneumonia: Requiring on 3 L of oxygen-continue therapeutic anticoagulation and empiric IV antimicrobial therapy.  Attempted titrate off oxygen over the next few days.    Pulmonary embolism: Has mild hypoxemia but otherwise stable-continue full dose anticoagulation.  Echo with no RV failure but does show numerous LV thrombus as well.  Awaiting lower extremity Doppler.  He may have underlying hypercoagulable state-but he clearly is not a candidate for further work-up/intervention-as it will likely not change outcome or management.  Await palliative care consultation for goals of care discussion with family.  Sepsis due to left-sided pneumonia: Sepsis physiology improving-likely aspiration pneumonitis given frailty/deconditioning-continue cefepime.  Follow culture data.    Hypoglycemia: Due to poor oral intake-continues to refuse to eat per nursing staff.  Some question whether these hypoglycemic readings from CBGs may not be accurate-have asked nursing staff to continue to try to correlate CBGs with serum blood glucose.  Continue D10 infusion.   Recent Labs  07/02/21 1013 07/02/21 1026 07/02/21 1037  GLUCAP 22* >600* 192*     HFrEF: Compensated-not in exacerbation-relatively stable-resume low-dose beta-blocker today.    History of SVT: Continue telemetry monitoring-resuming low-dose beta-blocker today.  LV thrombus: Seen on echo on 11/12-on full dose anticoagulation.  COPD: Not in exacerbation-continue bronchodilators  History of DM-2 (A1c 6.4): Given frailty/episodes of hypoglycemia-does not need any treatment for his DM-2.  Plan is to tolerate some amount of hyperglycemia once hypoglycemia has  resolved.  Asymptomatic bacteriuria: Urine culture positive for Citrobacter/Proteus-has chronic indwelling Foley-he does not appear to have clinical features consistent with UTI-in any event he is on appropriate IV antibiotics for cefepime that should cover the urine in case he does have a UTI.  PAD-s/p left BKA September 2022: Stump site appears unremarkable  History of BPH with chronic indwelling Foley catheter  GERD: Continue PPI  Failure to thrive syndrome/cachexia/deconditioning/debility: Appears to be incredibly frail-cachectic-claims to have poor oral intake for the past several weeks.  His overall long-term prognosis appears to be very grim.  I have consulted palliative care for goals of care discussion.  Spoke with patient's son on 11/12-Long discussion-he understands poor long-term prognosis-I have recommended a DNR-and transitioning to comfort care if he were to decompensate further.  Patient's son understands the tenuous clinical situation-but wants to reach out to several other family members-and will get back to [place].  Attempted to reach out to son today-left a voicemail.  Pressure ulcer: Present prior to admission-awaiting wound care evaluation. Pressure Injury 04/16/21 Buttocks Left Stage 2 -  Partial thickness loss of dermis presenting as a shallow open injury with a red, pink wound bed without slough. (Active)  04/16/21 2302  Location: Buttocks  Location Orientation: Left  Staging: Stage 2 -  Partial thickness loss of dermis presenting as a shallow open injury with a red, pink wound bed without slough.  Wound Description (Comments):   Present on Admission: Yes     Pressure Injury 06/30/21 Buttocks Mid Stage 2 -  Partial thickness loss of dermis presenting as a shallow open injury with a red, pink wound bed without slough. tiny open area mid of buttocks(coccyx); surrounding area denuded (Active)  06/30/21 2200  Location: Buttocks  Location Orientation: Mid  Staging: Stage 2  -  Partial thickness loss of dermis presenting as a shallow open injury with a red, pink wound bed without slough.  Wound Description (Comments): tiny open area mid of buttocks(coccyx); surrounding area denuded  Present on Admission: Yes    BMI Estimated body mass index is 20.1 kg/m as calculated from the following:   Height as of this encounter: 6\' 2"  (1.88 m).   Weight as of this encounter: 71 kg.     Procedures: None Consults: Palliative DVT Prophylaxis: IV Heparin Code Status:Full code Family Communication: Son-Tyler-[phone]-updated over the phone-see above discussion.  Time spent: 35 minutes-Greater than 50% of this time was spent in counseling, explanation of diagnosis, planning of further management, and coordination of care.   Disposition Plan: Status is: Inpatient  Remains inpatient appropriate because: Hypoxia due to PE/pneumonia/hypoglycemia/severe failure to thrive syndrome/poor oral intake-not stable for discharge.  On IV heparin/IV antibiotics with   Diet: Diet Order             Diet regular Room service appropriate? Yes with Assist; Fluid consistency: Thin  Diet effective now                     Antimicrobial agents: Anti-infectives (From admission,  onward)    Start     Dose/Rate Route Frequency Ordered Stop   07/01/21 0600  vancomycin (VANCOREADY) IVPB 1000 mg/200 mL  Status:  Discontinued        1,000 mg 200 mL/hr over 60 Minutes Intravenous Every 12 hours 06/30/21 1957 07/01/21 0936   07/01/21 0030  ceFEPIme (MAXIPIME) 2 g in sodium chloride 0.9 % 100 mL IVPB        2 g 200 mL/hr over 30 Minutes Intravenous Every 8 hours 06/30/21 1957     07/01/21 0000  metroNIDAZOLE (FLAGYL) IVPB 500 mg  Status:  Discontinued        500 mg 100 mL/hr over 60 Minutes Intravenous Every 12 hours 06/30/21 1954 06/30/21 1955   06/30/21 1615  ceFEPIme (MAXIPIME) 2 g in sodium chloride 0.9 % 100 mL IVPB        2 g 200 mL/hr over 30 Minutes Intravenous  Once  06/30/21 1609 06/30/21 1719   06/30/21 1615  metroNIDAZOLE (FLAGYL) IVPB 500 mg        500 mg 100 mL/hr over 60 Minutes Intravenous  Once 06/30/21 1609 06/30/21 1730   06/30/21 1615  vancomycin (VANCOREADY) IVPB 1750 mg/350 mL        1,750 mg 175 mL/hr over 120 Minutes Intravenous  Once 06/30/21 1609 06/30/21 1938        MEDICATIONS: Scheduled Meds:  (feeding supplement) PROSource Plus  30 mL Oral TID BM   vitamin C  1,000 mg Oral Daily   Chlorhexidine Gluconate Cloth  6 each Topical Daily   enoxaparin (LOVENOX) injection  70 mg Subcutaneous Q12H   fluticasone  1 spray Each Nare Daily   lactase  6,000 Units Oral TID WC   mirtazapine  15 mg Oral QHS   montelukast  10 mg Oral QHS   multivitamin with minerals  1 tablet Oral Daily   nutrition supplement (JUVEN)  1 packet Oral BID BM   pantoprazole  40 mg Oral BID   tamsulosin  0.4 mg Oral QPC supper   Continuous Infusions:  ceFEPime (MAXIPIME) IV 2 g (07/02/21 0938)   dextrose 40 mL/hr at 07/02/21 0000   PRN Meds:.acetaminophen **OR** acetaminophen, albuterol, azelastine, HYDROcodone-acetaminophen, loperamide, ondansetron **OR** ondansetron (ZOFRAN) IV   I have personally reviewed following labs and imaging studies  LABORATORY DATA: CBC: Recent Labs  Lab 06/28/21 0907 06/28/21 0925 06/30/21 1002 06/30/21 1430 06/30/21 1443 07/01/21 0138 07/02/21 0158  WBC 4.8  --  5.1  --   --  5.6 7.8  NEUTROABS 3.6  --  3.7  --   --   --   --   HGB 15.4   < > 14.6 15.0 16.7 12.2* 13.4  HCT 50.3   < > 48.5 44.0 49.0 38.5* 43.6  MCV 89.5  --  92.7  --   --  88.1 89.3  PLT 258  --  197  --   --  175 157   < > = values in this interval not displayed.     Basic Metabolic Panel: Recent Labs  Lab 06/28/21 0907 06/28/21 0925 06/30/21 1004 06/30/21 1430 06/30/21 1443 06/30/21 2213 07/01/21 0138 07/01/21 1801 07/02/21 0158  NA 136 138 134* 136 136  --  134*  --  132*  K 5.4* 5.4* 4.4 4.4 4.3  --  3.9  --  4.9  CL 99 103 102   --   --   --  101  --  102  CO2 24  --  19*  --   --   --  23  --  17*  GLUCOSE 112* 107* 102*  --   --   --  146* 121* 101*  BUN 56* 67* 56*  --   --   --  56*  --  53*  CREATININE 1.35* 1.20 1.23  --   --   --  1.09  --  1.04  CALCIUM 9.1  --  8.8*  --   --   --  8.3*  --  8.0*  MG  --   --   --   --   --  2.2  --   --   --      GFR: Estimated Creatinine Clearance: 70.2 mL/min (by C-G formula based on SCr of 1.04 mg/dL).  Liver Function Tests: Recent Labs  Lab 06/28/21 0907 06/30/21 1004 07/01/21 0138 07/02/21 0158  AST 25 28 23  QUANTITY NOT SUFFICIENT, UNABLE TO PERFORM TEST  ALT 10 10 10  QUANTITY NOT SUFFICIENT, UNABLE TO PERFORM TEST  ALKPHOS 189* 189* 147* 127*  BILITOT 1.6* 1.6* 1.5* QUANTITY NOT SUFFICIENT, UNABLE TO PERFORM TEST  PROT 7.9 7.6 6.4* 6.3*  ALBUMIN 2.4* 2.4* 2.0* 2.0*    Recent Labs  Lab 06/28/21 0907 06/30/21 1822  LIPASE 43 44    No results for input(s): AMMONIA in the last 168 hours.  Coagulation Profile: Recent Labs  Lab 06/30/21 1433  INR 1.9*     Cardiac Enzymes: No results for input(s): CKTOTAL, CKMB, CKMBINDEX, TROPONINI in the last 168 hours.  BNP (last 3 results) Recent Labs    10/24/20 1517 01/24/21 1106 02/22/21 1158  PROBNP 7,418* 22,081* 20,322*     Lipid Profile: No results for input(s): CHOL, HDL, LDLCALC, TRIG, CHOLHDL, LDLDIRECT in the last 72 hours.  Thyroid Function Tests: No results for input(s): TSH, T4TOTAL, FREET4, T3FREE, THYROIDAB in the last 72 hours.  Anemia Panel: No results for input(s): VITAMINB12, FOLATE, FERRITIN, TIBC, IRON, RETICCTPCT in the last 72 hours.  Urine analysis:    Component Value Date/Time   COLORURINE AMBER (A) 06/30/2021 0954   APPEARANCEUR CLOUDY (A) 06/30/2021 0954   APPEARANCEUR Clear 01/23/2017 1650   LABSPEC 1.029 06/30/2021 0954   PHURINE 5.0 06/30/2021 0954   GLUCOSEU NEGATIVE 06/30/2021 0954   HGBUR LARGE (A) 06/30/2021 0954   BILIRUBINUR NEGATIVE 06/30/2021 0954    BILIRUBINUR Negative 01/23/2017 [place] 06/30/2021 0954   PROTEINUR 100 (A) 06/30/2021 0954   UROBILINOGEN 1.0 01/31/2021 2054   NITRITE NEGATIVE 06/30/2021 0954   LEUKOCYTESUR LARGE (A) 06/30/2021 0954    Sepsis Labs: Lactic Acid, Venous    Component Value Date/Time   LATICACIDVEN 6.2 ([place]) 06/30/2021 1822    MICROBIOLOGY: Recent Results (from the past 240 hour(s))  Urine Culture     Status: Abnormal   Collection Time: 06/28/21  9:13 AM   Specimen: Urine, Catheterized  Result Value Ref Range Status   Specimen Description URINE, CATHETERIZED  Final   Special Requests   Final    NONE Performed at [facility], [address] [place], [place] [address]    Culture (A)  Final    >=100,000 COLONIES/mL CITROBACTER FREUNDII >=100,000 COLONIES/mL PSEUDOMONAS AERUGINOSA    Report Status 06/30/2021 FINAL  Final   Organism ID, Bacteria CITROBACTER FREUNDII (A)  Final   Organism ID, Bacteria PSEUDOMONAS AERUGINOSA (A)  Final      Susceptibility   Citrobacter freundii - MIC*    CEFAZOLIN >=64 RESISTANT  Resistant     CEFEPIME <=0.12 SENSITIVE Sensitive     CEFTRIAXONE <=0.25 SENSITIVE Sensitive     CIPROFLOXACIN 1 RESISTANT Resistant     GENTAMICIN <=1 SENSITIVE Sensitive     IMIPENEM <=0.25 SENSITIVE Sensitive     NITROFURANTOIN <=16 SENSITIVE Sensitive     TRIMETH/SULFA >=320 RESISTANT Resistant     PIP/TAZO <=4 SENSITIVE Sensitive     * >=100,000 COLONIES/mL CITROBACTER FREUNDII   Pseudomonas aeruginosa - MIC*    CEFTAZIDIME 4 SENSITIVE Sensitive     CIPROFLOXACIN <=0.25 SENSITIVE Sensitive     GENTAMICIN <=1 SENSITIVE Sensitive     IMIPENEM <=0.25 SENSITIVE Sensitive     PIP/TAZO 8 SENSITIVE Sensitive     CEFEPIME 4 SENSITIVE Sensitive     * >=100,000 COLONIES/mL PSEUDOMONAS AERUGINOSA  Resp Panel by RT-PCR (Flu A&B, Covid) Nasopharyngeal Swab     Status: None   Collection Time: 06/30/21  2:00 PM   Specimen: Nasopharyngeal Swab;  Nasopharyngeal(NP) swabs in vial transport medium  Result Value Ref Range Status   SARS Coronavirus 2 by RT PCR NEGATIVE NEGATIVE Final    Comment: (NOTE) SARS-CoV-2 target nucleic acids are NOT DETECTED.  The SARS-CoV-2 RNA is generally detectable in upper respiratory specimens during the acute phase of infection. The lowest concentration of SARS-CoV-2 viral copies this assay can detect is 138 copies/mL. A negative result does not preclude SARS-Cov-2 infection and should not be used as the sole basis for treatment or other patient management decisions. A negative result may occur with  improper specimen collection/handling, submission of specimen other than nasopharyngeal swab, presence of viral mutation(s) within the areas targeted by this assay, and inadequate number of viral copies(<138 copies/mL). A negative result must be combined with clinical observations, patient history, and epidemiological information. The expected result is Negative.  Fact Sheet for Patients:  EntrepreneurPulse.com.au  Fact Sheet for Healthcare Providers:  IncredibleEmployment.be  This test is no t yet approved or cleared by the [place] FDA and  has been authorized for detection and/or diagnosis of SARS-CoV-2 by FDA under an Emergency Use Authorization (EUA). This EUA will remain  in effect (meaning this test can be used) for the duration of the COVID-19 declaration under Section 564(b)(1) of the Act, 21 U.S.C.section 360bbb-3(b)(1), unless the authorization is terminated  or revoked sooner.       Influenza A by PCR NEGATIVE NEGATIVE Final   Influenza B by PCR NEGATIVE NEGATIVE Final    Comment: (NOTE) The Xpert Xpress SARS-CoV-2/FLU/RSV plus assay is intended as an aid in the diagnosis of influenza from Nasopharyngeal swab specimens and should not be used as a sole basis for treatment. Nasal washings and aspirates are unacceptable for Xpert Xpress  SARS-CoV-2/FLU/RSV testing.  Fact Sheet for Patients: EntrepreneurPulse.com.au  Fact Sheet for Healthcare Providers: IncredibleEmployment.be  This test is not yet approved or cleared by the [place] FDA and has been authorized for detection and/or diagnosis of SARS-CoV-2 by FDA under an Emergency Use Authorization (EUA). This EUA will remain in effect (meaning this test can be used) for the duration of the COVID-19 declaration under Section 564(b)(1) of the Act, 21 U.S.C. section 360bbb-3(b)(1), unless the authorization is terminated or revoked.  Performed at [facility], [place] [address], [place], [place] [address]   Urine Culture     Status: Abnormal (Preliminary result)   Collection Time: 06/30/21  2:13 PM   Specimen: In/Out Cath Urine  Result Value Ref Range Status   Specimen Description IN/OUT CATH  URINE  Final   Special Requests Normal  Final   Culture (A)  Final    >=100,000 COLONIES/mL GRAM NEGATIVE RODS SUSCEPTIBILITIES TO FOLLOW Performed at [facility], [place] [address], [place], [place] [address]    Report Status PENDING  Incomplete  Blood Culture (routine x 2)     Status: None (Preliminary result)   Collection Time: 06/30/21  6:19 PM   Specimen: BLOOD RIGHT FOREARM  Result Value Ref Range Status   Specimen Description BLOOD RIGHT FOREARM  Final   Special Requests   Final    BOTTLES DRAWN AEROBIC AND ANAEROBIC Blood Culture results may not be optimal due to an inadequate volume of blood received in culture bottles   Culture   Final    NO GROWTH 2 DAYS Performed at [facility], [place] [address], [place], [place] [address]    Report Status PENDING  Incomplete  MRSA Next Gen by PCR, Nasal     Status: None   Collection Time: 07/01/21  1:44 AM   Specimen: Nasal Mucosa; Nasal Swab  Result Value Ref Range Status   MRSA by PCR Next Gen NOT DETECTED NOT DETECTED Final    Comment: (NOTE) The GeneXpert MRSA  Assay (FDA approved for NASAL specimens only), is one component of a comprehensive MRSA colonization surveillance program. It is not intended to diagnose MRSA infection nor to guide or monitor treatment for MRSA infections. Test performance is not FDA approved in patients less than 11 years old. Performed at [facility], [place] [address] [place], [place] [address]     RADIOLOGY STUDIES/RESULTS: CT Angio Chest PE W/Cm &/Or Wo Cm  Result Date: 06/30/2021 CLINICAL DATA:  PE suspected, high prob EXAM: CT ANGIOGRAPHY CHEST WITH CONTRAST TECHNIQUE: Multidetector CT imaging of the chest was performed using the standard protocol during bolus administration of intravenous contrast. Multiplanar CT image reconstructions and MIPs were obtained to evaluate the vascular anatomy. CONTRAST:  77mL OMNIPAQUE IOHEXOL 350 MG/ML SOLN COMPARISON:  None. FINDINGS: Cardiovascular: Satisfactory opacification of the pulmonary arteries to the segmental level. No evidence of pulmonary embolism. Cardiomegaly with no definite increased right to left ventricular ratio. Retrograde reflux of intravenous contrast within the inferior vena cava and hepatic veins likely due to timing of contrast given no contrast noted within the left heart. No significant pericardial effusion. The thoracic aorta is normal in caliber. Mild atherosclerotic plaque of the thoracic aorta. No coronary artery calcifications. Mediastinum/Nodes: No enlarged mediastinal, hilar, or axillary lymph nodes. Thyroid gland, trachea, and esophagus demonstrate no significant findings. Lungs/Pleura: Partial collapse of bilateral lower lobes. No focal consolidation. No pulmonary nodule. No pulmonary mass. Bilateral small to moderate pleural effusions. No pneumothorax. Upper Abdomen: No acute abnormality. Musculoskeletal: No chest wall abnormality. No suspicious lytic or blastic osseous lesions. No acute displaced fracture. Multilevel degenerative changes of the spine.  Review of the MIP images confirms the above findings. IMPRESSION: 1. Bilateral distal central, segmental, subsegmental pulmonary emboli extending to all five lobes. No definite findings of right heart strain. Cannot rule out a developing pulmonary infarction within left lower lobe. 2. Bilateral small to moderate pleural effusions. 3. Partially collapsed bilateral lower lobes with bubbly ground-glass airspace opacities within the left lower lobe. Left lower lobe findings could represent infection/inflammation versus atelectasis versus developing pulmonary infarction. Recommend follow-up CT in 3 months to evaluate for resolution. These results were called by telephone at the time of interpretation on 06/30/2021 at 5:25 pm to provider Dr. Sabra Heck,  who verbally acknowledged these results. Electronically Signed   By: Iven Finn M.D.   On: 06/30/2021 17:33   DG Chest Port 1 View  Result Date: 06/30/2021 CLINICAL DATA:  Hypoxia.  Additional history provided: Hypoglycemia. EXAM: PORTABLE CHEST 1 VIEW COMPARISON:  Prior chest radiographs 06/28/2021 and earlier. FINDINGS: Cardiomegaly, unchanged. Aortic atherosclerosis. Hazy opacity within the mid to lower lung fields bilaterally compatible with small to, at most moderate, bilateral pleural effusions. Associated bibasilar atelectasis and/or consolidation. No evidence of pneumothorax. No acute bony abnormality identified. IMPRESSION: Cardiomegaly, unchanged. Bilateral small to, at most moderate, bilateral pleural effusions. Associated bibasilar atelectasis and/or consolidation. Aortic Atherosclerosis (ICD10-I70.0). Electronically Signed   By: Kellie Simmering D.O.   On: 06/30/2021 14:42   DG Tibia/Fibula Left Port  Result Date: 06/30/2021 CLINICAL DATA:  Sepsis. EXAM: PORTABLE LEFT TIBIA AND FIBULA - 2 VIEW COMPARISON:  None. FINDINGS: Left below-knee amputation present. No acute fracture or dislocation. No cortical erosion. Soft tissues are within normal limits.  Joint spaces are well maintained. Peripheral vascular calcifications are present. IMPRESSION: 1. Left below the knee amputation. 2. No acute bony abnormality. Electronically Signed   By: Ronney Asters M.D.   On: 06/30/2021 19:36   ECHOCARDIOGRAM COMPLETE  Result Date: 07/01/2021    ECHOCARDIOGRAM REPORT   Patient Name:   Stephen Whitaker Date of Exam: 07/01/2021 Medical Rec #:  [id number]          Height:       74.0 in Accession #:    [id number]         Weight:       157.4 lb Date of Birth:  1956-03-18          BSA:          1.963 m Patient Age:    43 years           BP:           108/85 mmHg Patient Gender: M                  HR:           93 bpm. Exam Location:  Inpatient Procedure: 2D Echo, Cardiac Doppler, Color Doppler and Intracardiac            Opacification Agent Indications:    CHF  History:        Patient has prior history of Echocardiogram examinations, most                 recent 12/08/2020. CHF, Signs/Symptoms:Syncope; Risk                 Factors:Hypertension. Anemia.  Sonographer:    Merrie Roof RDCS Referring Phys: [id number] [place]  1. Large LV thrombus burden, multiple large apical clot foci, largest 2 x 1 cm. . Left ventricular ejection fraction, by estimation, is <20%. The left ventricle has severely decreased function. The left ventricle demonstrates global hypokinesis. The left ventricular internal cavity size was moderately dilated. Left ventricular diastolic parameters are consistent with Grade III diastolic dysfunction (restrictive).  2. Right ventricular systolic function is normal. The right ventricular size is moderately enlarged.  3. Left atrial size was severely dilated.  4. Right atrial size was severely dilated.  5. The mitral valve is normal in structure. Mild to moderate mitral valve regurgitation. No evidence of mitral stenosis.  6. Tricuspid valve regurgitation is severe.  7. The aortic valve is calcified. There is moderate calcification of the aortic  valve.  Aortic valve regurgitation is mild. Aortic valve sclerosis/calcification is present, without any evidence of aortic stenosis.  8. The inferior vena cava is dilated in size with <50% respiratory variability, suggesting right atrial pressure of 15 mmHg. Comparison(s): LV thrombus burden is new when compared to prior. Discussed with primary team. On anticoagulation. FINDINGS  Left Ventricle: Large LV thrombus burden, multiple large apical clot foci, largest 2 x 1 cm. Left ventricular ejection fraction, by estimation, is <20%. The left ventricle has severely decreased function. The left ventricle demonstrates global hypokinesis. The left ventricular internal cavity size was moderately dilated. There is no left ventricular hypertrophy. Left ventricular diastolic parameters are consistent with Grade III diastolic dysfunction (restrictive). Right Ventricle: The right ventricular size is moderately enlarged. No increase in right ventricular wall thickness. Right ventricular systolic function is normal. Left Atrium: Left atrial size was severely dilated. Right Atrium: Right atrial size was severely dilated. Pericardium: There is no evidence of pericardial effusion. Mitral Valve: The mitral valve is normal in structure. Mild to moderate mitral valve regurgitation. No evidence of mitral valve stenosis. Tricuspid Valve: The tricuspid valve is normal in structure. Tricuspid valve regurgitation is severe. No evidence of tricuspid stenosis. Aortic Valve: The aortic valve is calcified. There is moderate calcification of the aortic valve. Aortic valve regurgitation is mild. Aortic valve sclerosis/calcification is present, without any evidence of aortic stenosis. Aortic valve mean gradient measures 2.0 mmHg. Aortic valve peak gradient measures 3.7 mmHg. Aortic valve area, by VTI measures 1.13 cm. Pulmonic Valve: The pulmonic valve was normal in structure. Pulmonic valve regurgitation is not visualized. No evidence of pulmonic  stenosis. Aorta: The aortic root is normal in size and structure. Venous: The inferior vena cava is dilated in size with less than 50% respiratory variability, suggesting right atrial pressure of 15 mmHg. IAS/Shunts: No atrial level shunt detected by color flow Doppler.  LEFT VENTRICLE PLAX 2D LVIDd:         5.40 cm LVIDs:         5.10 cm LV PW:         1.00 cm LV IVS:        0.70 cm LVOT diam:     2.00 cm LV SV:         17 LV SV Index:   9 LVOT Area:     3.14 cm  RIGHT VENTRICLE            IVC RV Basal diam:  4.90 cm    IVC diam: 2.30 cm RV Mid diam:    3.70 cm RV S prime:     7.07 cm/s TAPSE (M-mode): 1.3 cm LEFT ATRIUM              Index        RIGHT ATRIUM           Index LA diam:        4.40 cm  2.24 cm/m   RA Area:     35.10 cm LA Vol (A2C):   159.0 ml 80.99 ml/m  RA Volume:   153.00 ml 77.94 ml/m LA Vol (A4C):   192.0 ml 97.80 ml/m LA Biplane Vol: 178.0 ml 90.67 ml/m  AORTIC VALVE AV Area (Vmax):    1.22 cm AV Area (Vmean):   1.11 cm AV Area (VTI):     1.13 cm AV Vmax:           96.40 cm/s AV Vmean:  65.200 cm/s AV VTI:            0.154 m AV Peak Grad:      3.7 mmHg AV Mean Grad:      2.0 mmHg LVOT Vmax:         37.30 cm/s LVOT Vmean:        23.100 cm/s LVOT VTI:          0.055 m LVOT/AV VTI ratio: 0.36  AORTA Ao Root diam: 2.90 cm MITRAL VALVE               TRICUSPID VALVE MV Area (PHT): 7.02 cm    TR Peak grad:   31.4 mmHg MV Decel Time: 108 msec    TR Vmax:        280.00 cm/s MV E velocity: 45.80 cm/s MV A velocity: 25.70 cm/s  SHUNTS MV E/A ratio:  1.78        Systemic VTI:  0.06 m                            Systemic Diam: 2.00 cm Candee Furbish MD Electronically signed by Candee Furbish MD Signature Date/Time: 07/01/2021/5:49:04 PM    Final      LOS: 2 days   Oren Binet, MD  [facility]    To contact the attending provider between 7A-7P or the covering provider during after hours 7P-7A, please log into the web site [email or web address] and access using universal [place]  password for that web site. If you do not have the password, please call the hospital operator.  07/02/2021, 11:12 AM

## 2021-07-02 NOTE — Progress Notes (Signed)
PT Cancellation Note  Patient Details Name: Stephen Whitaker MRN: [id number] DOB: Aug 08, 1956   Cancelled Treatment:    Reason Eval/Treat Not Completed: Patient not medically ready Spoke with RN, pt status remains precarious. BG in 20s most recently, episodes of desaturating into 70s, and pt minimally responsive. Will hold comprehensive evaluation at this time and monitor for appropriateness tomorrow.   Ellouise Newer, PT, DPT 07/02/2021, 10:15 AM

## 2021-07-02 NOTE — Progress Notes (Signed)
OT Cancellation Note  Patient Details Name: Stephen Whitaker MRN: [id number] DOB: 06/19/1956   Cancelled Treatment:    Reason Eval/Treat Not Completed: Medical issues which prohibited therapy. RN requesting hold on therapy today as pt is on a Non-Rebreather and continues to desat. Additionally, pt is refusing to participate and minimally responsive at times. OT will follow up for evaluation as pt becomes medically ready for therapy and willing to participate.   Joshua Zeringue H., OTR/L Acute Rehabilitation  Jacora Hopkins Elane Yolanda Bonine 07/02/2021, 12:22 PM

## 2021-07-02 NOTE — Consult Note (Signed)
Consultation Note Date: 07/02/2021   Patient Name: Stephen Whitaker  DOB: 07/04/1956  MRN: [id number]  Age / Sex: 65 y.o., male  PCP: [name], NP Referring Physician: Jonetta Osgood, MD  Reason for Consultation: Establishing goals of care  HPI/Patient Profile: 65 y.o. 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 Sharol Given, T2DM, SVT, PVD, LUTS/BPH with chronic foley admitted on 06/30/2021 from SNF with sudden onset of shortness of breath, hypoxia and hypoglycemia.   Patient admitted for acute hypoxic respiratory failure due to PE and aspiration pneumonia, sepsis, poor oral intake/hypoglycemia. Refusing to eat or take medications. PMT has been consulted to assist with goals of care conversation.  Clinical Assessment and Goals of Care:  I have reviewed medical records including EPIC notes, labs and imaging, assessed the patient, called patient's son Stephen Whitaker, and then met at the bedside with son Stephen Whitaker and ex-wife Stephen Whitaker to discuss diagnosis prognosis, Stephen Whitaker, EOL wishes, disposition and options.  I introduced Palliative Medicine as specialized medical care for people living with serious illness. It focuses on providing relief from the symptoms and stress of a serious illness. The goal is to improve quality of life for both the patient and the family.  We discussed a brief life review of the patient and then focused on their current illness. I attempted to elicit values and goals of care important to the patient.    Stephen Whitaker tells me that he feels fine and his body will heal itself. He understands that he has clots in his lungs and when I share my worry for his current condition, he expresses his confidence that he will improve and simply needs to rest. He also tells me he has already voiced his preference for a full code and updated his son on  his condition. He shares that he has familiarity with hospice, as his mother was enrolled before she passed. He is agreeable to talking at a later time.  When speaking with Stephen Whitaker via phone, he tells me he has received a thorough update from Stephen Whitaker and he is feeling overwhelmed with his new understanding of patient's high risk to decline rapidly. We discussed patient's PE, aspiration PNA, sepsis, frailty, ongoing functional and nutritional decline. Stephen Whitaker is leaning on his mother for support and awaiting her return from [place] (visiting another hospitalized family member) before making a decision for DNR. He feels patient has "let himself go" over the past months and he is concerned that patient may be approaching end of life.  I then met with Stephen Whitaker, patient's ex-wife (divorced 30 years ago) [place], and patient at the bedside. Reviewed his current illness and treatment plan in detail. Stephen Whitaker is patient's only son. Patient was adopted and he not close to any other family/friends. Stephen Whitaker has not been close to the patient for 20 years and only recently learned of his prior home's unlivable state (patient was hoarding). Patient tells his family that he does not eat  because the food doesn't taste good. Patient has expressed not wanting to be a burden and family ultimately wants to support his autonomy/decisions, as they have never discussed his preferences before. Shared my concern that interventions such as CPR/intubation and feeding tubes would likely cause more harm than benefit, providing education on the risks and benefits and evidence-based research in similar patients. Discussed patient's potential for decline and attempted to elicit his wishes. Patient initially states "keep me on it, my body will rest as long as it takes" in regards to mechanical ventilation. Counseled on poor prognosis and that it is unlikely he would successfully wean, providing explanation of alternative for tracheostomy. Patient then  states he would not want a feeding tube, dialysis, CPR/ventilator - also noting that he does not need these interventions.    The difference between aggressive medical intervention and comfort care was considered in light of the patient's goals of care. The natural disease trajectory and expectations at EOL were discussed with family, particularly lost of interest in food and water.   Advanced directives, concepts specific to code status, artifical feeding and hydration, and rehospitalization were considered and discussed.  Hospice and Palliative Care services outpatient were explained and offered.  Discussed the importance of continued conversation with family and the medical providers regarding overall plan of care and treatment options, ensuring decisions are within the context of the patient's values and GOCs.    Questions and concerns were addressed.  Hard Choices booklet left for review. The family was encouraged to call with questions or concerns.  PMT will continue to support holistically.   PATIENT is primary Media planner. Next of kin is his only son, Stephen Whitaker. No HCPOA.    SUMMARY OF RECOMMENDATIONS   -DNR/DNI -Continue current care and treatment otherwise; patient/family hope for some improvement and stabilization -Ongoing goc discussions based on clinical course -Psychosocial and emotional support provided -PMT will continue to follow  Code Status/Advance Care Planning: DNR  Palliative Prophylaxis:  Aspiration, Delirium Protocol, and Frequent Pain Assessment  Additional Recommendations (Limitations, Scope, Preferences): No Artificial Feeding, No Hemodialysis, and No Tracheostomy  Psycho-social/Spiritual:  Desire for further Chaplaincy support:tbd Additional Recommendations: Education on Hospice  Prognosis:  Unable to determine  Discharge Planning: To Be Determined      Primary Diagnoses: Present on Admission:  Pulmonary embolism ([place])  Acute  respiratory failure with hypoxia (HCC)  (Resolved) Hypoglycemia without diagnosis of diabetes mellitus  (Resolved) Chronic asthma, mild persistent, uncomplicated  Essential hypertension  Chronic bronchitis (HCC)  Gastroesophageal reflux disease without esophagitis  HFrEF (heart failure with reduced ejection fraction) ([place])  Type 2 diabetes mellitus with hypoglycemia without coma ([place])  Sepsis ([place])   I have reviewed the medical record, interviewed the patient and family, and examined the patient. The following aspects are pertinent.  Past Medical History:  Diagnosis Date   Abnormal liver function tests    Arthritis    Boil of buttock    COPD (chronic obstructive pulmonary disease) (HCC)    Chronic Bronchitis.Advair inhaler    Dyspnea    GERD (gastroesophageal reflux disease)    takes Protonix daily   Gout    takes Allopurinol daily   Hemorrhoids    Hypertension    takes HCTZ daily   Hypomagnesemia    Leg ulcer (HCC)    Mild mitral regurgitation    Pancreatitis    10 years ago   Pneumonia 2016   Tricuspid regurgitation    Tubular adenoma of colon  Social History   Socioeconomic History   Marital status: Single    Spouse name: Not on file   Number of children: 1   Years of education:     Highest education level: Some college, no degree  Occupational History   Not on file  Tobacco Use   Smoking status: Never   Smokeless tobacco: Never  Vaping Use   Vaping Use: Never used  Substance and Sexual Activity   Alcohol use: Yes    Alcohol/week: 0.0 standard drinks    Comment: rarely   Drug use: No   Sexual activity: Not Currently  Other Topics Concern   Not on file  Social History Narrative   Not on file   Social Determinants of Health   Financial Resource Strain: Medium Risk   Difficulty of Paying Living Expenses: Somewhat hard  Food Insecurity: Food Insecurity Present   Worried About Running Out of Food in the Last Year: Sometimes true   Ran Out of Food  in the Last Year: Never true  Transportation Needs: No Transportation Needs   Lack of Transportation (Medical): No   Lack of Transportation (Non-Medical): No  Physical Activity: Not on file  Stress: Not on file  Social Connections: Not on file   Family History  Adopted: Yes  Problem Relation Age of Onset   Cancer Sister 51       unknown origin    Scheduled Meds:  (feeding supplement) PROSource Plus  30 mL Oral TID BM   vitamin C  1,000 mg Oral Daily   carvedilol  3.125 mg Oral BID WC   Chlorhexidine Gluconate Cloth  6 each Topical Daily   enoxaparin (LOVENOX) injection  70 mg Subcutaneous Q12H   fluticasone  1 spray Each Nare Daily   lactase  6,000 Units Oral TID WC   mirtazapine  15 mg Oral QHS   montelukast  10 mg Oral QHS   multivitamin with minerals  1 tablet Oral Daily   nutrition supplement (JUVEN)  1 packet Oral BID BM   pantoprazole  40 mg Oral BID   tamsulosin  0.4 mg Oral QPC supper   Continuous Infusions:  ceFEPime (MAXIPIME) IV 2 g (07/02/21 0938)   dextrose 40 mL/hr at 07/02/21 0800   PRN Meds:.acetaminophen **OR** acetaminophen, albuterol, azelastine, HYDROcodone-acetaminophen, loperamide, ondansetron **OR** ondansetron (ZOFRAN) IV Medications Prior to Admission:  Prior to Admission medications   Medication Sig Start Date End Date Taking? Authorizing Provider  acetaminophen (TYLENOL) 325 MG tablet Take 2 tablets (650 mg total) by mouth every 6 (six) hours as needed for mild pain (or Fever >/= 101). 04/25/21  Yes Nita Sells, MD  albuterol (VENTOLIN HFA) 108 (90 Base) MCG/ACT inhaler Inhale 2 puffs into the lungs every 4 (four) hours as needed for wheezing or shortness of breath. 10/06/20  Yes Tanda Rockers, MD  azelastine (ASTELIN) 0.1 % nasal spray Place 1-2 sprays into both nostrils 2 (two) times daily as needed (nasal drainage). Use in each nostril as directed Patient taking differently: Place 1 spray into both nostrils 2 (two) times daily as needed  (nasal drainage). 01/30/21  Yes Garnet Sierras, DO  colchicine 0.6 MG tablet Take 0.6 mg by mouth daily as needed (gout).   Yes [provider]  EPINEPHrine 0.3 mg/0.3 mL IJ SOAJ injection Inject 0.3 mg into the muscle as needed for anaphylaxis. 05/08/21  Yes Garnet Sierras, DO  feeding supplement, GLUCERNA SHAKE, (GLUCERNA SHAKE) LIQD Take 237 mLs by mouth 3 (three)  times daily between meals.   Yes [provider]  fluticasone (FLONASE) 50 MCG/ACT nasal spray Place 1 spray into both nostrils 2 (two) times daily as needed (nasal congestion). Patient taking differently: Place 1 spray into both nostrils in the morning and at bedtime. 05/08/21  Yes Garnet Sierras, DO  fluticasone-salmeterol (ADVAIR) 500-50 MCG/ACT AEPB Inhale 1 puff into the lungs in the morning and at bedtime.   Yes [provider]  furosemide (LASIX) 40 MG tablet Take 2 tablets (80 mg total) by mouth daily. Patient taking differently: Take 40 mg by mouth daily. 06/23/21  Yes [place], Maricela Bo, FNP  HYDROcodone-acetaminophen (NORCO/VICODIN) 5-325 MG tablet Take 1-2 tablets by mouth every 6 (six) hours as needed for moderate pain or severe pain. Patient taking differently: Take 1 tablet by mouth every 6 (six) hours as needed for moderate pain or severe pain. 04/26/21  Yes Lavina Hamman, MD  ketotifen (ZADITOR) 0.025 % ophthalmic solution Place 1 drop into both eyes daily as needed (itchy, watery eyes).   Yes [provider]  Lactase 9000 units TABS Take 18,000 Units by mouth daily as needed (lactose intolerance).   Yes [provider]  loperamide (IMODIUM) 2 MG capsule Take 1 capsule (2 mg total) by mouth as needed for diarrhea or loose stools. Patient taking differently: Take 2 mg by mouth every 6 (six) hours as needed for diarrhea or loose stools. 05/02/21  Yes Lavina Hamman, MD  metoprolol succinate (TOPROL XL) 25 MG 24 hr tablet Take 1 tablet (25 mg total) by mouth daily. 01/04/21  Yes Chandrasekhar,  Mahesh A, MD  mirtazapine (REMERON) 15 MG tablet Take 15 mg by mouth at bedtime.   Yes [provider]  montelukast (SINGULAIR) 10 MG tablet Take 1 tablet (10 mg total) by mouth at bedtime. 05/08/21  Yes Garnet Sierras, DO  Nutritional Supplements (,FEEDING SUPPLEMENT, PROSOURCE PLUS) liquid Take 30 mLs by mouth 3 (three) times daily between meals. 05/02/21  Yes Lavina Hamman, MD  pantoprazole (PROTONIX) 40 MG tablet TAKE ONE TABLET BY MOUTH 30 TO 60 MINUTES BEFORE YOUR FIRST AND LAST MEALS OF THE DAY Patient taking differently: Take 40 mg by mouth 2 (two) times daily. 01/25/21  Yes Tanda Rockers, MD  polyethylene glycol (MIRALAX / GLYCOLAX) 17 g packet Take 17 g by mouth daily as needed for mild constipation. 04/25/21  Yes Nita Sells, MD  Probiotic Product (SUPER PROBIOTIC DIGESTIVE) CAPS Take 1 tablet by mouth 3 (three) times daily as needed (stomach upset).   Yes [provider]  sacubitril-valsartan (ENTRESTO) 24-26 MG Take 1 tablet by mouth 2 (two) times daily. 01/24/21  Yes Chandrasekhar, Mahesh A, MD  Spirulina 500 MG TABS Take 500 mg by mouth in the morning and at bedtime.   Yes [provider]  tamsulosin (FLOMAX) 0.4 MG CAPS capsule Take 1 capsule (0.4 mg total) by mouth daily after supper. 04/25/21  Yes Nita Sells, MD  Trolamine Salicylate (BLUE-EMU HEMP EX) Apply 1 application topically daily as needed (pain).   Yes [provider]  vitamin C (ASCORBIC ACID) 500 MG tablet Take 1,000 mg by mouth daily.   Yes [provider]  zinc sulfate 220 (50 Zn) MG capsule Take 1 capsule (220 mg total) by mouth daily. 05/03/21  Yes Lavina Hamman, MD  ascorbic acid (VITAMIN C) 1000 MG tablet Take 1 tablet (1,000 mg total) by mouth daily. Patient not taking: No sig reported 05/03/21   Berle Mull  M, MD  budesonide-formoterol (SYMBICORT) 160-4.5 MCG/ACT inhaler USE 2 INHALATIONS TWICE A DAY Patient not taking: Reported on 06/30/2021 03/13/21    Tanda Rockers, MD  lactase (LACTAID) 3000 units tablet Take 3 tablets (9,000 Units total) by mouth 3 (three) times daily with meals. Patient not taking: No sig reported 05/02/21   Lavina Hamman, MD  Multiple Vitamin (MULTIVITAMIN WITH MINERALS) TABS tablet Take 1 tablet by mouth daily. Patient not taking: Reported on 06/30/2021 05/03/21   Lavina Hamman, MD  Olopatadine HCl 0.2 % SOLN Apply 1 drop to eye daily as needed (itchy/watery eyes). Patient not taking: Reported on 06/30/2021 01/30/21   Garnet Sierras, DO   Allergies  Allergen Reactions   Ciprofloxacin Rash and Other (See Comments)    SYNCOPE    Lactose Intolerance (Gi) Diarrhea   Shellfish Allergy Anaphylaxis   Sulfa Antibiotics Other (See Comments)    SYNCOPE DIZZINESS    American Cockroach    Cat Hair Extract    Dust Mite Extract    Molds & Smuts    Review of Systems  All other systems reviewed and are negative.  Physical Exam Vitals and nursing note reviewed.  Constitutional:      General: He is not in acute distress.    Appearance: He is ill-appearing.     Comments: NRB in place  Cardiovascular:     Rate and Rhythm: Normal rate.  Pulmonary:     Effort: Pulmonary effort is normal.  Musculoskeletal:     Comments: Right BKA  Neurological:     Mental Status: He is alert and oriented to person, place, and time.    Vital Signs: BP (!) 104/91 (BP Location: Right Arm)   Pulse 95   Temp 98 F (36.7 C) (Axillary)   Resp 16   Ht _0  (1.88 m)   Wt 71 kg   SpO2 99%   BMI 20.10 kg/m  Pain Scale: PAINAD   Pain Score: 0-No pain   SpO2: SpO2: 99 % O2 Device:SpO2: 99 % O2 Flow Rate: .O2 Flow Rate (L/min): 5 L/min (pt breathes through mouth)  IO: Intake/output summary:  Intake/Output Summary (Last 24 hours) at 07/02/2021 1317 Last data filed at 07/02/2021 6270 Gross per 24 hour  Intake 1391.9 ml  Output 375 ml  Net 1016.9 ml    LBM: Last BM Date:  (uta) Baseline Weight: Weight: 90.7 kg Most recent  weight: Weight: 71 kg     Palliative Assessment/Data: 20%     Time In: 2:30pm Time Out: 4:10pm Time Total: 100 minutes Greater than 50% of this time was spent in counseling and coordinating care related to the above assessment and plan.  Dorthy Cooler, PA-C Palliative Medicine Team Team phone # [phone]  Thank you for allowing the Palliative Medicine Team to assist in the care of this patient. Please utilize secure chat with additional questions, if there is no response within 30 minutes please call the above phone number.  Palliative Medicine Team providers are available by phone from 7am to 7pm daily and can be reached through the team cell phone.  Should this patient require assistance outside of these hours, please call the patient's attending physician.

## 2021-07-02 NOTE — Consult Note (Signed)
[facility] Consult   Reason for Consult: '' confusion/failure to thrive-family requesting psych consult.'' Referring Physician:  Oren Binet, MD Patient Identification: Stephen Stephen MRN:  [id number] Principal Diagnosis: Adjustment disorder with depressed mood Diagnosis:  Principal Problem:   Adjustment disorder with depressed mood Active Problems:   Gastroesophageal reflux disease without esophagitis   Essential hypertension   Sepsis ([facility] [place])   Chronic bronchitis ([facility])   HFrEF (heart failure with reduced ejection fraction) ([place])   Pulmonary embolism (H[facility])   Acute respiratory failure with hypoxia ([facility])   Type 2 diabetes mellitus with hypoglycemia without coma ([place])   Failure to thrive in adult   Total Time spent with patient: 1 hour  Subjective:   Stephen Stephen is a 65 y.o. male patient admitted with hypoglycemia.  HPI:  65 y.o. male with history of HFrEF, PAD-recent left BKA (04/21/2021), HTN, DM-2, BPH-with chronic indwelling Foley catheter, COPD-admitted to the hospital for shortness of breath and hypoglycemia. Per record, he was found to have hypoxia due to PE for which he was admitted to the hospitalist service. Psychiatric consultation was requested for failure to thrive/confusion by patient's family according to the hospitalist. However, patient denies prior history of mental illness. He states that he was living at [facility] for rehabilitation and his major concern is inability to eat and possibility of being home less when he is discharged home. Patient reports occasional sadness due to multiple medical issues and inability to eat but denies psychosis, delusions, and self harming thoughts. Collateral information obtained from his son-Stephen Stephen confirmed that patient did not have any prior history of mental illness. But family requested psychiatric consult because his father has history of hoard stuffs and they are concerned that his medical condition  seems to be making him depressed. Patient agreed to take Mirtazapine for appetite stimulation and to address depressive symptoms.  Past Psychiatric History: none reported  Risk to Self:  denies Risk to Others:  denies Prior Inpatient Therapy:  none Prior Outpatient Therapy:  none  Past Medical History:  Past Medical History:  Diagnosis Date   Abnormal liver function tests    Arthritis    Boil of buttock    COPD (chronic obstructive pulmonary disease) (HCC)    Chronic Bronchitis.Advair inhaler    Dyspnea    GERD (gastroesophageal reflux disease)    takes Protonix daily   Gout    takes Allopurinol daily   Hemorrhoids    Hypertension    takes HCTZ daily   Hypomagnesemia    Leg ulcer (HCC)    Mild mitral regurgitation    Pancreatitis    10 years ago   Pneumonia 2016   Tricuspid regurgitation    Tubular adenoma of colon     Past Surgical History:  Procedure Laterality Date   AMPUTATION Left 07/14/2014   Procedure: 2nd Toe Amputation;  Surgeon: [name], MD;  Location: [facility];  Service: Orthopedics;  Laterality: Left;   AMPUTATION Left 11/16/2015   Procedure: Left Great Toe Amputation at Metatarsophalangeal Joint;  Surgeon: [name], MD;  Location: [facility];  Service: Orthopedics;  Laterality: Left;   AMPUTATION Right 06/13/2016   Procedure: RIGHT 2nd Toe Amputation;  Surgeon: [name], MD;  Location: [facility];  Service: Orthopedics;  Laterality: Right;   AMPUTATION Left 10/19/2016   Procedure: Amputation Left 3rd Toe;  Surgeon: [name], MD;  Location: [facility];  Service: Orthopedics;  Laterality: Left;   AMPUTATION Left 04/21/2021  Procedure: LEFT BELOW KNEE AMPUTATION;  Surgeon: [name], MD;  Location: [facility];  Service: Orthopedics;  Laterality: Left;   RIGHT/LEFT HEART CATH AND CORONARY ANGIOGRAPHY N/A 12/12/2020   Procedure: RIGHT/LEFT HEART CATH AND CORONARY ANGIOGRAPHY;  Surgeon: Lorretta Harp, MD;  Location: [facility];  Service:  Cardiovascular;  Laterality: N/A;   TOE AMPUTATION Left 11/16/2015   left great toe    TOE AMPUTATION Right 06/13/2016   TONSILLECTOMY     Family History:  Family History  Adopted: Yes  Problem Relation Age of Onset   Cancer Sister 62       unknown origin    Family Psychiatric  History:   Social History:  Social History   Substance and Sexual Activity  Alcohol Use Yes   Alcohol/week: 0.0 standard drinks   Comment: rarely     Social History   Substance and Sexual Activity  Drug Use No    Social History   Socioeconomic History   Marital status: Single    Spouse name: Not on file   Number of children: 1   Years of education:     Highest education level: Some college, no degree  Occupational History   Not on file  Tobacco Use   Smoking status: Never   Smokeless tobacco: Never  Vaping Use   Vaping Use: Never used  Substance and Sexual Activity   Alcohol use: Yes    Alcohol/week: 0.0 standard drinks    Comment: rarely   Drug use: No   Sexual activity: Not Currently  Other Topics Concern   Not on file  Social History Narrative   Not on file   Social Determinants of Health   Financial Resource Strain: Medium Risk   Difficulty of Paying Living Expenses: Somewhat hard  Food Insecurity: Food Insecurity Present   Worried About Running Out of Food in the Last Year: Sometimes true   Ran Out of Food in the Last Year: Never true  Transportation Needs: No Transportation Needs   Lack of Transportation (Medical): No   Lack of Transportation (Non-Medical): No  Physical Activity: Not on file  Stress: Not on file  Social Connections: Not on file   Additional Social History:    Allergies:   Allergies  Allergen Reactions   Ciprofloxacin Rash and Other (See Comments)    SYNCOPE    Lactose Intolerance (Gi) Diarrhea   Shellfish Allergy Anaphylaxis   Sulfa Antibiotics Other (See Comments)    SYNCOPE DIZZINESS    American Cockroach    Cat Hair Extract    Dust  Mite Extract    Molds & Smuts     Labs:  Results for orders placed or performed during the hospital encounter of 06/30/21 (from the past 48 hour(s))  CBG monitoring, ED     Status: Abnormal   Collection Time: 06/30/21 12:54 PM  Result Value Ref Range   Glucose-Capillary 112 (H) 70 - 99 mg/dL    Comment: Glucose reference range applies only to samples taken after fasting for at least 8 hours.  CBG monitoring, ED     Status: Abnormal   Collection Time: 06/30/21  1:47 PM  Result Value Ref Range   Glucose-Capillary 133 (H) 70 - 99 mg/dL    Comment: Glucose reference range applies only to samples taken after fasting for at least 8 hours.  Resp Panel by RT-PCR (Flu A&B, Covid) Nasopharyngeal Swab     Status: None   Collection Time: 06/30/21  2:00 PM  Specimen: Nasopharyngeal Swab; Nasopharyngeal(NP) swabs in vial transport medium  Result Value Ref Range   SARS Coronavirus 2 by RT PCR NEGATIVE NEGATIVE    Comment: (NOTE) SARS-CoV-2 target nucleic acids are NOT DETECTED.  The SARS-CoV-2 RNA is generally detectable in upper respiratory specimens during the acute phase of infection. The lowest concentration of SARS-CoV-2 viral copies this assay can detect is 138 copies/mL. A negative result does not preclude SARS-Cov-2 infection and should not be used as the sole basis for treatment or other patient management decisions. A negative result may occur with  improper specimen collection/handling, submission of specimen other than nasopharyngeal swab, presence of viral mutation(s) within the areas targeted by this assay, and inadequate number of viral copies(<138 copies/mL). A negative result must be combined with clinical observations, patient history, and epidemiological information. The expected result is Negative.  Fact Sheet for Patients:  EntrepreneurPulse.com.au  Fact Sheet for Healthcare Providers:  IncredibleEmployment.be  This test is no t yet  approved or cleared by the [place] FDA and  has been authorized for detection and/or diagnosis of SARS-CoV-2 by FDA under an Emergency Use Authorization (EUA). This EUA will remain  in effect (meaning this test can be used) for the duration of the COVID-19 declaration under Section 564(b)(1) of the Act, 21 U.S.C.section 360bbb-3(b)(1), unless the authorization is terminated  or revoked sooner.       Influenza A by PCR NEGATIVE NEGATIVE   Influenza B by PCR NEGATIVE NEGATIVE    Comment: (NOTE) The Xpert Xpress SARS-CoV-2/FLU/RSV plus assay is intended as an aid in the diagnosis of influenza from Nasopharyngeal swab specimens and should not be used as a sole basis for treatment. Nasal washings and aspirates are unacceptable for Xpert Xpress SARS-CoV-2/FLU/RSV testing.  Fact Sheet for Patients: EntrepreneurPulse.com.au  Fact Sheet for Healthcare Providers: IncredibleEmployment.be  This test is not yet approved or cleared by the [place] FDA and has been authorized for detection and/or diagnosis of SARS-CoV-2 by FDA under an Emergency Use Authorization (EUA). This EUA will remain in effect (meaning this test can be used) for the duration of the COVID-19 declaration under Section 564(b)(1) of the Act, 21 U.S.C. section 360bbb-3(b)(1), unless the authorization is terminated or revoked.  Performed at [facility], [place] [address] [place], [place] [address]   Urine Culture     Status: Abnormal (Preliminary result)   Collection Time: 06/30/21  2:13 PM   Specimen: In/Out Cath Urine  Result Value Ref Range   Specimen Description IN/OUT CATH URINE    Special Requests Normal    Culture (A)     >=100,000 COLONIES/mL GRAM NEGATIVE RODS SUSCEPTIBILITIES TO FOLLOW Performed at [facility], [place] [address] [place], [place] [address]    Report Status PENDING   I-Stat arterial blood gas, [facility] ED)     Status: Abnormal   Collection  Time: 06/30/21  2:30 PM  Result Value Ref Range   pH, Arterial 7.467 (H) 7.350 - 7.450   pCO2 arterial 31.0 (L) 32.0 - 48.0 mmHg   pO2, Arterial 362 (H) 83.0 - 108.0 mmHg   Bicarbonate 22.5 20.0 - 28.0 mmol/L   TCO2 23 22 - 32 mmol/L   O2 Saturation 100.0 %   Acid-Base Excess 0.0 0.0 - 2.0 mmol/L   Sodium 136 135 - 145 mmol/L   Potassium 4.4 3.5 - 5.1 mmol/L   Calcium, Ion 1.20 1.15 - 1.40 mmol/L   HCT 44.0 39.0 - 52.0 %   Hemoglobin 15.0  13.0 - 17.0 g/dL   Patient temperature 97.9 F    Sample type ARTERIAL   Lactic acid, plasma     Status: Abnormal   Collection Time: 06/30/21  2:33 PM  Result Value Ref Range   Lactic Acid, Venous 4.9 (HH) 0.5 - 1.9 mmol/L    Comment: CRITICAL RESULT CALLED TO, READ BACK BY AND VERIFIED WITH: Thayer Jew RN BY SSTEPHENS [phone] [id number] Performed at [facility], [place] [address] [place], [place] [address]   Protime-INR     Status: Abnormal   Collection Time: 06/30/21  2:33 PM  Result Value Ref Range   Prothrombin Time 21.4 (H) 11.4 - 15.2 seconds   INR 1.9 (H) 0.8 - 1.2    Comment: (NOTE) INR goal varies based on device and disease states. Performed at [facility], [place] [address] [place], [name] [address]   APTT     Status: None   Collection Time: 06/30/21  2:33 PM  Result Value Ref Range   aPTT 29 24 - 36 seconds    Comment: Performed at [facility] [address] [place], [place] [address]  Brain natriuretic peptide     Status: Abnormal   Collection Time: 06/30/21  2:33 PM  Result Value Ref Range   B Natriuretic Peptide >4,500.0 (H) 0.0 - 100.0 pg/mL    Comment: Performed at [facility] [address] [place], [place] [address]  I-Stat venous blood gas, ED     Status: Abnormal   Collection Time: 06/30/21  2:43 PM  Result Value Ref Range   pH, Ven 7.361 7.250 - 7.430   pCO2, Ven 37.1 (L) 44.0 - 60.0 mmHg   pO2, Ven 29.0 (LL) 32.0 - 45.0 mmHg   Bicarbonate 21.0 20.0 - 28.0 mmol/L   TCO2 22 22 - 32 mmol/L   O2  Saturation 54.0 %   Acid-base deficit 4.0 (H) 0.0 - 2.0 mmol/L   Sodium 136 135 - 145 mmol/L   Potassium 4.3 3.5 - 5.1 mmol/L   Calcium, Ion 1.00 (L) 1.15 - 1.40 mmol/L   HCT 49.0 39.0 - 52.0 %   Hemoglobin 16.7 13.0 - 17.0 g/dL   Sample type VENOUS    Comment NOTIFIED PHYSICIAN   POC CBG, ED     Status: Abnormal   Collection Time: 06/30/21  4:45 PM  Result Value Ref Range   Glucose-Capillary 120 (H) 70 - 99 mg/dL    Comment: Glucose reference range applies only to samples taken after fasting for at least 8 hours.  Blood Culture (routine x 2)     Status: None (Preliminary result)   Collection Time: 06/30/21  6:19 PM   Specimen: BLOOD RIGHT FOREARM  Result Value Ref Range   Specimen Description BLOOD RIGHT FOREARM    Special Requests      BOTTLES DRAWN AEROBIC AND ANAEROBIC Blood Culture results may not be optimal due to an inadequate volume of blood received in culture bottles   Culture      NO GROWTH 2 DAYS Performed at [facility], [place] [address], [place],  [address]    Report Status PENDING   Lactic acid, plasma     Status: Abnormal   Collection Time: 06/30/21  6:22 PM  Result Value Ref Range   Lactic Acid, Venous 6.2 (HH) 0.5 - 1.9 mmol/L    Comment: CRITICAL VALUE NOTED.  VALUE IS CONSISTENT WITH PREVIOUSLY REPORTED AND CALLED VALUE. Performed at [facility], [place] [address] [place], [place] [address]   Lipase, blood     Status: None   Collection Time: 06/30/21  6:22 PM  Result Value Ref Range   Lipase 44 11 - 51 U/L    Comment: Performed at [facility] [address] [place], [place] [address]  Troponin I (High Sensitivity)     Status: Abnormal   Collection Time: 06/30/21  6:22 PM  Result Value Ref Range   Troponin I (High Sensitivity) 24 (H) <18 ng/L    Comment: (NOTE) Elevated high sensitivity troponin I (hsTnI) values and significant  changes across serial measurements may suggest ACS but many other  chronic and acute conditions are  known to elevate hsTnI results.  Refer to the "Links" section for chest pain algorithms and additional  guidance. Performed at [facility], [place] [address], [place], [place] [address]   POC CBG, ED     Status: Abnormal   Collection Time: 06/30/21  6:54 PM  Result Value Ref Range   Glucose-Capillary 101 (H) 70 - 99 mg/dL    Comment: Glucose reference range applies only to samples taken after fasting for at least 8 hours.  POC CBG, ED     Status: Abnormal   Collection Time: 06/30/21  8:50 PM  Result Value Ref Range   Glucose-Capillary 38 (LL) 70 - 99 mg/dL    Comment: Glucose reference range applies only to samples taken after fasting for at least 8 hours.   Comment 1 Notify RN   Glucose, capillary     Status: Abnormal   Collection Time: 06/30/21  9:49 PM  Result Value Ref Range   Glucose-Capillary 114 (H) 70 - 99 mg/dL    Comment: Glucose reference range applies only to samples taken after fasting for at least 8 hours.  Troponin I (High Sensitivity)     Status: Abnormal   Collection Time: 06/30/21 10:13 PM  Result Value Ref Range   Troponin I (High Sensitivity) 20 (H) <18 ng/L    Comment: (NOTE) Elevated high sensitivity troponin I (hsTnI) values and significant  changes across serial measurements may suggest ACS but many other  chronic and acute conditions are known to elevate hsTnI results.  Refer to the "Links" section for chest pain algorithms and additional  guidance. Performed at [facility], [place] [address], [place], [place] [address]   Magnesium     Status: None   Collection Time: 06/30/21 10:13 PM  Result Value Ref Range   Magnesium 2.2 1.7 - 2.4 mg/dL    Comment: Performed at [facility] [address], [place], [place] [address]  Glucose, capillary     Status: Abnormal   Collection Time: 06/30/21 11:12 PM  Result Value Ref Range   Glucose-Capillary 69 (L) 70 - 99 mg/dL    Comment: Glucose reference range applies only to samples taken after  fasting for at least 8 hours.  Glucose, capillary     Status: Abnormal   Collection Time: 06/30/21 11:53 PM  Result Value Ref Range   Glucose-Capillary 62 (L) 70 - 99 mg/dL    Comment: Glucose reference range applies only to samples taken after fasting for at least 8 hours.  Glucose, capillary     Status: Abnormal   Collection Time: 07/01/21 12:30 AM  Result Value Ref Range   Glucose-Capillary 169 (H) 70 - 99 mg/dL    Comment: Glucose reference range applies only to samples taken after  fasting for at least 8 hours.  Glucose, capillary     Status: None   Collection Time: 07/01/21  1:24 AM  Result Value Ref Range   Glucose-Capillary 93 70 - 99 mg/dL    Comment: Glucose reference range applies only to samples taken after fasting for at least 8 hours.  CBC     Status: Abnormal   Collection Time: 07/01/21  1:38 AM  Result Value Ref Range   WBC 5.6 4.0 - 10.5 K/uL   RBC 4.37 4.22 - 5.81 MIL/uL   Hemoglobin 12.2 (L) 13.0 - 17.0 g/dL   HCT 38.5 (L) 39.0 - 52.0 %   MCV 88.1 80.0 - 100.0 fL   MCH 27.9 26.0 - 34.0 pg   MCHC 31.7 30.0 - 36.0 g/dL   RDW 19.4 (H) 11.5 - 15.5 %   Platelets 175 150 - 400 K/uL   nRBC 1.6 (H) 0.0 - 0.2 %    Comment: Performed at [facility] [address] [place], [place] [address]  Heparin level (unfractionated)     Status: Abnormal   Collection Time: 07/01/21  1:38 AM  Result Value Ref Range   Heparin Unfractionated 0.92 (H) 0.30 - 0.70 IU/mL    Comment: (NOTE) The clinical reportable range upper limit is being lowered to >1.10 to align with the FDA approved guidance for the current laboratory assay.  If heparin results are below expected values, and patient dosage has  been confirmed, suggest follow up testing of antithrombin III levels. Performed at [facility], [place] [address] [place], [place] [address]   Procalcitonin     Status: None   Collection Time: 07/01/21  1:38 AM  Result Value Ref Range   Procalcitonin 1.91 ng/mL     Comment:        Interpretation: PCT > 0.5 ng/mL and <= 2 ng/mL: Systemic infection (sepsis) is possible, but other conditions are known to elevate PCT as well. (NOTE)       Sepsis PCT Algorithm           Lower Respiratory Tract                                      Infection PCT Algorithm    ----------------------------     ----------------------------         PCT < 0.25 ng/mL                PCT < 0.10 ng/mL          Strongly encourage             Strongly discourage   discontinuation of antibiotics    initiation of antibiotics    ----------------------------     -----------------------------       PCT 0.25 - 0.50 ng/mL            PCT 0.10 - 0.25 ng/mL               OR       >80% decrease in PCT            Discourage initiation of                                            antibiotics  Encourage discontinuation           of antibiotics    ----------------------------     -----------------------------         PCT >= 0.50 ng/mL              PCT 0.26 - 0.50 ng/mL                AND       <80% decrease in PCT             Encourage initiation of                                             antibiotics       Encourage continuation           of antibiotics    ----------------------------     -----------------------------        PCT >= 0.50 ng/mL                  PCT > 0.50 ng/mL               AND         increase in PCT                  Strongly encourage                                      initiation of antibiotics    Strongly encourage escalation           of antibiotics                                     -----------------------------                                           PCT <= 0.25 ng/mL                                                 OR                                        > 80% decrease in PCT                                      Discontinue / Do not initiate                                             antibiotics  Performed at [facility], [place] [address], [place], [place] [address]   Comprehensive metabolic panel     Status: Abnormal   Collection Time: 07/01/21  1:38 AM  Result Value Ref Range   Sodium 134 (L) 135 - 145 mmol/L   Potassium 3.9 3.5 - 5.1 mmol/L   Chloride 101 98 - 111 mmol/L   CO2 23 22 - 32 mmol/L   Glucose, Bld 146 (H) 70 - 99 mg/dL    Comment: Glucose reference range applies only to samples taken after fasting for at least 8 hours.   BUN 56 (H) 8 - 23 mg/dL   Creatinine, Ser 1.09 0.61 - 1.24 mg/dL   Calcium 8.3 (L) 8.9 - 10.3 mg/dL   Total Protein 6.4 (L) 6.5 - 8.1 g/dL   Albumin 2.0 (L) 3.5 - 5.0 g/dL   AST 23 15 - 41 U/L   ALT 10 0 - 44 U/L   Alkaline Phosphatase 147 (H) 38 - 126 U/L   Total Bilirubin 1.5 (H) 0.3 - 1.2 mg/dL   GFR, Estimated >60 >60 mL/min    Comment: (NOTE) Calculated using the CKD-EPI Creatinine Equation (2021)    Anion gap 10 5 - 15    Comment: Performed at [facility], [place] [address], [place], [place] [address]  MRSA Next Gen by PCR, Nasal     Status: None   Collection Time: 07/01/21  1:44 AM   Specimen: Nasal Mucosa; Nasal Swab  Result Value Ref Range   MRSA by PCR Next Gen NOT DETECTED NOT DETECTED    Comment: (NOTE) The GeneXpert MRSA Assay (FDA approved for NASAL specimens only), is one component of a comprehensive MRSA colonization surveillance program. It is not intended to diagnose MRSA infection nor to guide or monitor treatment for MRSA infections. Test performance is not FDA approved in patients less than 50 years old. Performed at [facility], [place] [address], [place], [place] [address]   Glucose, capillary     Status: Abnormal   Collection Time: 07/01/21  2:19 AM  Result Value Ref Range   Glucose-Capillary 58 (L) 70 - 99 mg/dL    Comment: Glucose reference range applies only to samples taken after fasting for at least 8 hours.  Glucose, capillary     Status: Abnormal   Collection Time: 07/01/21  3:09 AM  Result Value Ref Range   Glucose-Capillary 48 (L)  70 - 99 mg/dL    Comment: Glucose reference range applies only to samples taken after fasting for at least 8 hours.  Glucose, capillary     Status: Abnormal   Collection Time: 07/01/21  3:37 AM  Result Value Ref Range   Glucose-Capillary 207 (H) 70 - 99 mg/dL    Comment: Glucose reference range applies only to samples taken after fasting for at least 8 hours.  Glucose, capillary     Status: Abnormal   Collection Time: 07/01/21  4:42 AM  Result Value Ref Range   Glucose-Capillary 180 (H) 70 - 99 mg/dL    Comment: Glucose reference range applies only to samples taken after fasting for at least 8 hours.  Glucose, capillary     Status: Abnormal   Collection Time: 07/01/21  5:48 AM  Result Value Ref Range   Glucose-Capillary 158 (H) 70 - 99 mg/dL    Comment: Glucose reference range applies only to samples taken after fasting for at least 8 hours.  Glucose, capillary     Status: Abnormal   Collection Time: 07/01/21  6:36 AM  Result Value Ref Range   Glucose-Capillary 115 (H) 70 - 99 mg/dL    Comment: Glucose reference range applies only to samples taken  after fasting for at least 8 hours.  Glucose, capillary     Status: None   Collection Time: 07/01/21  7:58 AM  Result Value Ref Range   Glucose-Capillary 81 70 - 99 mg/dL    Comment: Glucose reference range applies only to samples taken after fasting for at least 8 hours.  Glucose, capillary     Status: Abnormal   Collection Time: 07/01/21  9:07 AM  Result Value Ref Range   Glucose-Capillary 111 (H) 70 - 99 mg/dL    Comment: Glucose reference range applies only to samples taken after fasting for at least 8 hours.  Glucose, capillary     Status: None   Collection Time: 07/01/21 10:02 AM  Result Value Ref Range   Glucose-Capillary 80 70 - 99 mg/dL    Comment: Glucose reference range applies only to samples taken after fasting for at least 8 hours.  Heparin level (unfractionated)     Status: Abnormal   Collection Time: 07/01/21 10:42 AM   Result Value Ref Range   Heparin Unfractionated 1.01 (H) 0.30 - 0.70 IU/mL    Comment: (NOTE) The clinical reportable range upper limit is being lowered to >1.10 to align with the FDA approved guidance for the current laboratory assay.  If heparin results are below expected values, and patient dosage has  been confirmed, suggest follow up testing of antithrombin III levels. Performed at [facility], [place] [address] [place], [place] [address]   Glucose, capillary     Status: None   Collection Time: 07/01/21 11:28 AM  Result Value Ref Range   Glucose-Capillary 78 70 - 99 mg/dL    Comment: Glucose reference range applies only to samples taken after fasting for at least 8 hours.  Glucose, capillary     Status: Abnormal   Collection Time: 07/01/21 12:01 PM  Result Value Ref Range   Glucose-Capillary 68 (L) 70 - 99 mg/dL    Comment: Glucose reference range applies only to samples taken after fasting for at least 8 hours.  Glucose, capillary     Status: None   Collection Time: 07/01/21 12:55 PM  Result Value Ref Range   Glucose-Capillary 84 70 - 99 mg/dL    Comment: Glucose reference range applies only to samples taken after fasting for at least 8 hours.  Glucose, capillary     Status: None   Collection Time: 07/01/21  2:09 PM  Result Value Ref Range   Glucose-Capillary 76 70 - 99 mg/dL    Comment: Glucose reference range applies only to samples taken after fasting for at least 8 hours.  Glucose, capillary     Status: Abnormal   Collection Time: 07/01/21  2:49 PM  Result Value Ref Range   Glucose-Capillary 52 (L) 70 - 99 mg/dL    Comment: Glucose reference range applies only to samples taken after fasting for at least 8 hours.  Glucose, capillary     Status: Abnormal   Collection Time: 07/01/21  3:15 PM  Result Value Ref Range   Glucose-Capillary 65 (L) 70 - 99 mg/dL    Comment: Glucose reference range applies only to samples taken after fasting for at least 8 hours.   Glucose, capillary     Status: Abnormal   Collection Time: 07/01/21  3:47 PM  Result Value Ref Range   Glucose-Capillary 215 (H) 70 - 99 mg/dL    Comment: Glucose reference range applies only to samples taken after fasting for at least 8 hours.  Glucose, capillary  Status: None   Collection Time: 07/01/21  5:44 PM  Result Value Ref Range   Glucose-Capillary 92 70 - 99 mg/dL    Comment: Glucose reference range applies only to samples taken after fasting for at least 8 hours.  Glucose, random     Status: Abnormal   Collection Time: 07/01/21  6:01 PM  Result Value Ref Range   Glucose, Bld 121 (H) 70 - 99 mg/dL    Comment: Glucose reference range applies only to samples taken after fasting for at least 8 hours. Performed at [facility], [place] [address], [place], [place] [address]   Glucose, capillary     Status: Abnormal   Collection Time: 07/01/21  6:01 PM  Result Value Ref Range   Glucose-Capillary 122 (H) 70 - 99 mg/dL    Comment: Glucose reference range applies only to samples taken after fasting for at least 8 hours.  Glucose, capillary     Status: Abnormal   Collection Time: 07/01/21  7:18 PM  Result Value Ref Range   Glucose-Capillary 110 (H) 70 - 99 mg/dL    Comment: Glucose reference range applies only to samples taken after fasting for at least 8 hours.  Glucose, capillary     Status: Abnormal   Collection Time: 07/01/21  8:13 PM  Result Value Ref Range   Glucose-Capillary 142 (H) 70 - 99 mg/dL    Comment: Glucose reference range applies only to samples taken after fasting for at least 8 hours.  Glucose, capillary     Status: Abnormal   Collection Time: 07/01/21  9:03 PM  Result Value Ref Range   Glucose-Capillary 271 (H) 70 - 99 mg/dL    Comment: Glucose reference range applies only to samples taken after fasting for at least 8 hours.  Glucose, capillary     Status: Abnormal   Collection Time: 07/01/21 10:13 PM  Result Value Ref Range   Glucose-Capillary 138  (H) 70 - 99 mg/dL    Comment: Glucose reference range applies only to samples taken after fasting for at least 8 hours.  Glucose, capillary     Status: Abnormal   Collection Time: 07/01/21 11:02 PM  Result Value Ref Range   Glucose-Capillary 130 (H) 70 - 99 mg/dL    Comment: Glucose reference range applies only to samples taken after fasting for at least 8 hours.  Glucose, capillary     Status: Abnormal   Collection Time: 07/02/21 12:10 AM  Result Value Ref Range   Glucose-Capillary 191 (H) 70 - 99 mg/dL    Comment: Glucose reference range applies only to samples taken after fasting for at least 8 hours.  Glucose, capillary     Status: Abnormal   Collection Time: 07/02/21  1:01 AM  Result Value Ref Range   Glucose-Capillary 128 (H) 70 - 99 mg/dL    Comment: Glucose reference range applies only to samples taken after fasting for at least 8 hours.  CBC     Status: Abnormal   Collection Time: 07/02/21  1:58 AM  Result Value Ref Range   WBC 7.8 4.0 - 10.5 K/uL   RBC 4.88 4.22 - 5.81 MIL/uL   Hemoglobin 13.4 13.0 - 17.0 g/dL   HCT 43.6 39.0 - 52.0 %   MCV 89.3 80.0 - 100.0 fL   MCH 27.5 26.0 - 34.0 pg   MCHC 30.7 30.0 - 36.0 g/dL   RDW 20.2 (H) 11.5 - 15.5 %   Platelets 157 150 - 400 K/uL  nRBC 1.5 (H) 0.0 - 0.2 %    Comment: Performed at [facility], [place] [address] [place], [place] [address]  Comprehensive metabolic panel     Status: Abnormal   Collection Time: 07/02/21  1:58 AM  Result Value Ref Range   Sodium 132 (L) 135 - 145 mmol/L   Potassium 4.9 3.5 - 5.1 mmol/L   Chloride 102 98 - 111 mmol/L   CO2 17 (L) 22 - 32 mmol/L   Glucose, Bld 101 (H) 70 - 99 mg/dL    Comment: Glucose reference range applies only to samples taken after fasting for at least 8 hours.   BUN 53 (H) 8 - 23 mg/dL   Creatinine, Ser 1.04 0.61 - 1.24 mg/dL   Calcium 8.0 (L) 8.9 - 10.3 mg/dL   Total Protein 6.3 (L) 6.5 - 8.1 g/dL   Albumin 2.0 (L) 3.5 - 5.0 g/dL   AST QUANTITY NOT SUFFICIENT,  UNABLE TO PERFORM TEST 15 - 41 U/L   ALT QUANTITY NOT SUFFICIENT, UNABLE TO PERFORM TEST 0 - 44 U/L   Alkaline Phosphatase 127 (H) 38 - 126 U/L   Total Bilirubin QUANTITY NOT SUFFICIENT, UNABLE TO PERFORM TEST 0.3 - 1.2 mg/dL   GFR, Estimated >60 >60 mL/min    Comment: (NOTE) Calculated using the CKD-EPI Creatinine Equation (2021)    Anion gap 13 5 - 15    Comment: Performed at [facility] [address] [place], [place] [address]  Glucose, capillary     Status: Abnormal   Collection Time: 07/02/21  2:12 AM  Result Value Ref Range   Glucose-Capillary 133 (H) 70 - 99 mg/dL    Comment: Glucose reference range applies only to samples taken after fasting for at least 8 hours.  Procalcitonin     Status: None   Collection Time: 07/02/21  3:25 AM  Result Value Ref Range   Procalcitonin 4.29 ng/mL    Comment:        Interpretation: PCT > 2 ng/mL: Systemic infection (sepsis) is likely, unless other causes are known. (NOTE)       Sepsis PCT Algorithm           Lower Respiratory Tract                                      Infection PCT Algorithm    ----------------------------     ----------------------------         PCT < 0.25 ng/mL                PCT < 0.10 ng/mL          Strongly encourage             Strongly discourage   discontinuation of antibiotics    initiation of antibiotics    ----------------------------     -----------------------------       PCT 0.25 - 0.50 ng/mL            PCT 0.10 - 0.25 ng/mL               OR       >80% decrease in PCT            Discourage initiation of  antibiotics      Encourage discontinuation           of antibiotics    ----------------------------     -----------------------------         PCT >= 0.50 ng/mL              PCT 0.26 - 0.50 ng/mL               AND       <80% decrease in PCT              Encourage initiation of                                             antibiotics       Encourage  continuation           of antibiotics    ----------------------------     -----------------------------        PCT >= 0.50 ng/mL                  PCT > 0.50 ng/mL               AND         increase in PCT                  Strongly encourage                                      initiation of antibiotics    Strongly encourage escalation           of antibiotics                                     -----------------------------                                           PCT <= 0.25 ng/mL                                                 OR                                        > 80% decrease in PCT                                      Discontinue / Do not initiate                                             antibiotics  Performed at [facility], [place] [address], [place], [place] [address]   Glucose, capillary     Status: Abnormal   Collection  Time: 07/02/21  4:09 AM  Result Value Ref Range   Glucose-Capillary 130 (H) 70 - 99 mg/dL    Comment: Glucose reference range applies only to samples taken after fasting for at least 8 hours.  Glucose, capillary     Status: None   Collection Time: 07/02/21  6:18 AM  Result Value Ref Range   Glucose-Capillary 92 70 - 99 mg/dL    Comment: Glucose reference range applies only to samples taken after fasting for at least 8 hours.  Glucose, capillary     Status: Abnormal   Collection Time: 07/02/21  8:17 AM  Result Value Ref Range   Glucose-Capillary 101 (H) 70 - 99 mg/dL    Comment: Glucose reference range applies only to samples taken after fasting for at least 8 hours.  Glucose, capillary     Status: Abnormal   Collection Time: 07/02/21 10:10 AM  Result Value Ref Range   Glucose-Capillary 27 (LL) 70 - 99 mg/dL    Comment: Glucose reference range applies only to samples taken after fasting for at least 8 hours.   Comment 1 Notify RN   Glucose, capillary     Status: Abnormal   Collection Time: 07/02/21 10:13 AM  Result Value Ref Range    Glucose-Capillary 22 (LL) 70 - 99 mg/dL    Comment: Glucose reference range applies only to samples taken after fasting for at least 8 hours.   Comment 1 Notify RN    Comment 2 Repeat Test   Glucose, capillary     Status: Abnormal   Collection Time: 07/02/21 10:26 AM  Result Value Ref Range   Glucose-Capillary >600 (HH) 70 - 99 mg/dL    Comment: Glucose reference range applies only to samples taken after fasting for at least 8 hours.  Glucose, capillary     Status: Abnormal   Collection Time: 07/02/21 10:37 AM  Result Value Ref Range   Glucose-Capillary 192 (H) 70 - 99 mg/dL    Comment: Glucose reference range applies only to samples taken after fasting for at least 8 hours.    Current Facility-Administered Medications  Medication Dose Route Frequency Provider Last Rate Last Admin   (feeding supplement) PROSource Plus liquid 30 mL  30 mL Oral TID BM Orma Flaming, MD   30 mL at 07/02/21 0936   acetaminophen (TYLENOL) tablet 650 mg  650 mg Oral Q6H PRN Orma Flaming, MD       Or   acetaminophen (TYLENOL) suppository 650 mg  650 mg Rectal Q6H PRN Orma Flaming, MD       albuterol (PROVENTIL) (2.5 MG/3ML) 0.083% nebulizer solution 3 mL  3 mL Inhalation Q4H PRN Orma Flaming, MD       ascorbic acid (VITAMIN C) tablet 1,000 mg  1,000 mg Oral Daily Orma Flaming, MD   1,000 mg at 07/02/21 0936   azelastine (ASTELIN) 0.1 % nasal spray 1 spray  1 spray Each Nare BID PRN Orma Flaming, MD       carvedilol (COREG) tablet 3.125 mg  3.125 mg Oral BID WC Ghimire, Henreitta Leber, MD       ceFEPIme (MAXIPIME) 2 g in sodium chloride 0.9 % 100 mL IVPB  2 g Intravenous Q8H Orma Flaming, MD 200 mL/hr at 07/02/21 0938 2 g at 07/02/21 3614   Chlorhexidine Gluconate Cloth 2 % PADS 6 each  6 each Topical Daily Orma Flaming, MD   6 each at 07/02/21 1140   dextrose 10 % infusion   Intravenous Continuous Ghimire,  Henreitta Leber, MD 40 mL/hr at 07/02/21 0800 Infusion Verify at 07/02/21 0800   enoxaparin (LOVENOX)  injection 70 mg  70 mg Subcutaneous Q12H Jerilynn Birkenhead, RPH   70 mg at 07/01/21 1445   fluticasone (FLONASE) 50 MCG/ACT nasal spray 1 spray  1 spray Each Nare Daily Orma Flaming, MD   1 spray at 07/02/21 8101   HYDROcodone-acetaminophen (NORCO/VICODIN) 5-325 MG per tablet 1 tablet  1 tablet Oral Q6H PRN Orma Flaming, MD       lactase (LACTAID) tablet 6,000 Units  6,000 Units Oral TID WC Jonetta Osgood, MD   6,000 Units at 07/02/21 7510   loperamide (IMODIUM) capsule 2 mg  2 mg Oral Q6H PRN Orma Flaming, MD       mirtazapine (REMERON) tablet 15 mg  15 mg Oral QHS Orma Flaming, MD   15 mg at 06/30/21 2321   montelukast (SINGULAIR) tablet 10 mg  10 mg Oral QHS Orma Flaming, MD   10 mg at 06/30/21 2321   multivitamin with minerals tablet 1 tablet  1 tablet Oral Daily Orma Flaming, MD   1 tablet at 07/02/21 2585   nutrition supplement (JUVEN) (JUVEN) powder packet 1 packet  1 packet Oral BID BM Jonetta Osgood, MD   1 packet at 07/02/21 0942   ondansetron (ZOFRAN) tablet 4 mg  4 mg Oral Q6H PRN Orma Flaming, MD       Or   ondansetron [facility]) injection 4 mg  4 mg Intravenous Q6H PRN Orma Flaming, MD       pantoprazole (PROTONIX) EC tablet 40 mg  40 mg Oral BID Orma Flaming, MD   40 mg at 07/02/21 0936   tamsulosin (FLOMAX) capsule 0.4 mg  0.4 mg Oral QPC supper Orma Flaming, MD        Musculoskeletal: Strength & Muscle Tone:  not tested Gait & Station: unable to stand Patient leans: N/A      Psychiatric Specialty Exam:  Presentation  General Appearance: Appropriate for Environment Eye Contact:Fair Speech:Slow; Clear and Coherent Speech Volume:Decreased Handedness:Right  Mood and Affect  Mood:Dysphoric Affect:Constricted  Thought Process  Thought Processes:Linear Descriptions of Associations:Intact Orientation:Full (Time, Place and Person) Thought Content:Logical History of Schizophrenia/Schizoaffective disorder:No data recorded Duration of  Psychotic Symptoms:No data recorded Hallucinations:Hallucinations: None Ideas of Reference:None Suicidal Thoughts:Suicidal Thoughts: No Homicidal Thoughts:Homicidal Thoughts: No  Sensorium  Memory:Immediate Fair; Recent Fair; Remote Fair Judgment:Intact Insight:Shallow  Executive Functions  Concentration:No data recorded Attention Span:Fair [place]  Psychomotor Activity  Psychomotor Activity:Psychomotor Activity: Psychomotor Retardation  Assets  Assets:Desire for Improvement; Social Support  Sleep  Sleep:Sleep: Fair  Physical Exam: Physical Exam ROS Blood pressure (!) 116/98, pulse 95, temperature 98 F (36.7 C), temperature source Axillary, resp. rate (!) 30, height 6\' 2"  (1.88 m), weight 71 kg, SpO2 (!) 83 %. Body mass index is 20.1 kg/m.  Treatment Plan Summary: 65 year old male with multiple medical issue who denies prior history of mental illness. Patient was admitted for hypoglycemia and shortness of breath. He now has difficulty eating due to poor appetite and endorsing depressive symptoms secondary to his medical condition and possibility of being homeless after hospital discharge.  Recommendations: -Continue Mirtazapine 15 mg at bedtime for depression/appetite stimulation-patient agreed to take the medication -Consider appetite stimulation with Megace or Cyproheptadine if medically appropriate -Consider social worker consult for evaluation of patient social issues -Refer patient for psychiatric outpatient medication management upon discharge(per family request)   Disposition:  No evidence  of imminent psychiatric risk to self or others at present.   Psychiatric service is signing out. Re-consult as needed  Corena Pilgrim, MD 07/02/2021 11:55 AM

## 2021-07-03 ENCOUNTER — Inpatient Hospital Stay (HOSPITAL_COMMUNITY): Payer: BC Managed Care – PPO

## 2021-07-03 DIAGNOSIS — R4182 Altered mental status, unspecified: Secondary | ICD-10-CM

## 2021-07-03 DIAGNOSIS — Z7189 Other specified counseling: Secondary | ICD-10-CM

## 2021-07-03 LAB — URINE CULTURE
Culture: >=100000 — AB
Special Requests: NORMAL

## 2021-07-03 LAB — CBC
HCT: 41.9 % (ref 39.0–52.0)
Hemoglobin: 12.7 g/dL — ABNORMAL LOW (ref 13.0–17.0)
MCH: 27.2 pg (ref 26.0–34.0)
MCHC: 30.3 g/dL (ref 30.0–36.0)
MCV: 89.7 fL (ref 80.0–100.0)
Platelets: 172 10*3/uL (ref 150–400)
RBC: 4.67 MIL/uL (ref 4.22–5.81)
RDW: 19.6 % — ABNORMAL HIGH (ref 11.5–15.5)
WBC: 8.1 10*3/uL (ref 4.0–10.5)
nRBC: 1 % — ABNORMAL HIGH (ref 0.0–0.2)

## 2021-07-03 LAB — GLUCOSE, CAPILLARY
Glucose-Capillary: 93 mg/dL (ref 70–99)
Glucose-Capillary: 114 mg/dL — ABNORMAL HIGH (ref 70–99)
Glucose-Capillary: 18 mg/dL — CL (ref 70–99)
Glucose-Capillary: 25 mg/dL — CL (ref 70–99)
Glucose-Capillary: 93 mg/dL (ref 70–99)
Glucose-Capillary: 96 mg/dL (ref 70–99)

## 2021-07-03 MED ORDER — POLYVINYL ALCOHOL 1.4 % OP SOLN
1.0000 [drp] | Freq: Four times a day (QID) | OPHTHALMIC | Status: DC | PRN
  Filled 2021-07-03: qty 15

## 2021-07-03 MED ORDER — HALOPERIDOL LACTATE 2 MG/ML PO CONC
0.5000 mg | ORAL | Status: DC | PRN
  Filled 2021-07-03: qty 0.3

## 2021-07-03 MED ORDER — BIOTENE DRY MOUTH MT LIQD: 15.0000 mL | OROMUCOSAL | Status: DC | PRN

## 2021-07-03 MED ORDER — LORAZEPAM 2 MG/ML IJ SOLN: 1.0000 mg | INTRAMUSCULAR | Status: DC | PRN

## 2021-07-03 MED ORDER — MORPHINE SULFATE (PF) 2 MG/ML IV SOLN
1.0000 mg | INTRAVENOUS | Status: DC | PRN
  Administered 2021-07-04: 1 mg via INTRAVENOUS
  Filled 2021-07-03: qty 1

## 2021-07-03 MED ORDER — BISACODYL 10 MG RE SUPP: 10.0000 mg | Freq: Every day | RECTAL | Status: DC | PRN

## 2021-07-03 MED ORDER — HALOPERIDOL 0.5 MG PO TABS
0.5000 mg | ORAL_TABLET | ORAL | Status: DC | PRN
  Filled 2021-07-03: qty 1

## 2021-07-03 MED ORDER — LORAZEPAM 2 MG/ML PO CONC: 1.0000 mg | ORAL | Status: DC | PRN

## 2021-07-03 MED ORDER — MORPHINE SULFATE (CONCENTRATE) 10 MG/0.5ML PO SOLN: 5.0000 mg | ORAL | Status: DC | PRN

## 2021-07-03 MED ORDER — HALOPERIDOL LACTATE 5 MG/ML IJ SOLN: 0.5000 mg | INTRAMUSCULAR | Status: DC | PRN

## 2021-07-03 MED ORDER — GLYCOPYRROLATE 1 MG PO TABS
1.0000 mg | ORAL_TABLET | ORAL | Status: DC | PRN
  Filled 2021-07-03: qty 1

## 2021-07-03 MED ORDER — LORAZEPAM 1 MG PO TABS: 1.0000 mg | ORAL_TABLET | ORAL | Status: DC | PRN

## 2021-07-03 MED ORDER — GLYCOPYRROLATE 0.2 MG/ML IJ SOLN: 0.2000 mg | INTRAMUSCULAR | Status: DC | PRN

## 2021-07-03 NOTE — Progress Notes (Signed)
OT Evaluation   Pt from [facility]. Unsure of PLOF as pt thinks he has only been at [facility] for 2 weeks (He has been there for 2 months). Compared to last visit in 04/2021, pt demonstrates a decline in functional status as noted below.  Recommend palliative care consult. Will follow acutely to facilitate DC to next venue of care for continued rehab.     07/03/21 1211  OT Visit Information  Last OT Received On 07/03/21  Assistance Needed +2  PT/OT/SLP Co-Evaluation/Treatment Yes  Reason for Co-Treatment Complexity of the patient's impairments (multi-system involvement);For patient/therapist safety;To address functional/ADL transfers (partial session)  OT goals addressed during session ADL's and self-care  History of Present Illness Stephen Whitaker is a 65 y.o. 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 Sharol Given, T2DM, SVT, PVD, LUTS/BPH with chronic foley who presented from his SNF due to sudden onset of shortness of breath, hypoxia and hypoglycemia.  Found to have bilateral PE and signs of sepsis with FTT.  Precautions  Precautions Fall  Precaution Comments L BKA, sacral ulcer, chronic foley  Home Living  Family/patient expects to be discharged to: Skilled nursing facility  Prior Function  Prior Level of Function  Needs assist  Physical Assist  Mobility (physical);ADLs (physical)  Mobility (physical) Transfers  ADLs (physical) Toileting;Dressing;Bathing  Mobility Comments reports 2 assist  ADLs Comments urinary catheter, had assist for cleaning after BM, assist for bathing, able to self feed  Communication  Communication No difficulties  Pain Assessment  Pain Assessment No/denies pain  Cognition  Arousal/Alertness Awake/alert  Behavior During Therapy Flat affect  Overall Cognitive Status No family/caregiver present to determine baseline cognitive functioning  General Comments Oriented to month, year,  location, states here because he is sick, slow to respond to commands and questions, decreased initiation for active movement  Upper Extremity Assessment  Upper Extremity Assessment Generalized weakness;RUE deficits/detail;LUE deficits/detail  RUE Deficits / Details weak grip; able to comlete hand to top of head; strengthe @ 3+/5 throughout  RUE Coordination decreased fine motor;decreased gross motor  LUE Deficits / Details similar to R  Lower Extremity Assessment  RLE Sensation history of peripheral neuropathy (reports intact sensation to light touch, but likely neuropathy)  Cervical / Trunk Assessment  Cervical / Trunk Assessment Kyphotic (forward head)  ADL  Overall ADL's  Needs assistance/impaired  Eating/Feeding Minimal assistance  Eating/Feeding Details (indicate cue type and reason) poor po intake  Grooming Moderate assistance  Upper Body Bathing Moderate assistance;Bed level  Lower Body Bathing Total assistance  Upper Body Dressing  Maximal assistance  Lower Body Dressing Total assistance  Toilet Transfer  (unablet o complete)  Toileting- Clothing Manipulation and Hygiene Total assistance (indwelling foley)  Functional mobility during ADLs Total assistance;+2 for physical assistance  Bed Mobility  Overal bed mobility Needs Assistance  Bed Mobility Supine to Sit;Sit to Supine  Supine to sit Total assist;HOB elevated  Sit to supine Max assist;+2 for physical assistance  General bed mobility comments increased time with cues to move legs toward EOB, but needing assist for each leg, assist to lift trunk using bed pad under shoulders as pt not initiating when given hand hold and asked to pull up; to supine pt assisting some laying on L elbow to lower trunk with cues and time and initating some with legs, but assist provided to lift legs into bed.  Transfers  Overall transfer level Needs assistance  General transfer  comment lateral scoot along EOB toward HOB with +2 total A using  bed pad with A for balance and cue for anterior lean  Balance  Overall balance assessment Needs assistance  Sitting-balance support Feet supported  Sitting balance-Leahy Scale Zero  Sitting balance - Comments leaning back at least mod A for most of session for sitting balance at EOB up for about 10 minutes with cues for head upright to look out of window, but not sustained, not attempting to hold himself up after cues for placing hands on EOB or on bed rail to assist with balance  Postural control Posterior lean  General Comments  General comments (skin integrity, edema, etc.) boggy R heel  OT - End of Session  Activity Tolerance Patient tolerated treatment well  Patient left in bed;with call bell/phone within reach;with bed alarm set  Nurse Communication Mobility status;Other (comment) (needs R prevalon boot)  OT Assessment  OT Recommendation/Assessment Patient needs continued OT Services  OT Visit Diagnosis Other abnormalities of gait and mobility (R26.89);Muscle weakness (generalized) (M62.81);Other symptoms and signs involving the nervous system (R29.898)  OT Problem List Decreased strength;Decreased range of motion;Decreased activity tolerance;Impaired balance (sitting and/or standing);Decreased coordination;Decreased cognition;Decreased safety awareness;Decreased knowledge of use of DME or AE  OT Plan  OT Frequency (ACUTE ONLY) Min 2X/week  OT Treatment/Interventions (ACUTE ONLY) Self-care/ADL training;Therapeutic exercise;Neuromuscular education;Energy conservation;DME and/or AE instruction;Therapeutic activities;Cognitive remediation/compensation;Patient/family education;Balance training  AM-PAC OT "6 Clicks" Daily Activity Outcome Measure (Version 2)  Help from another person eating meals? 3  Help from another person taking care of personal grooming? 2  Help from another person toileting, which includes using toliet, bedpan, or urinal? 1  Help from another person bathing (including  washing, rinsing, drying)? 2  Help from another person to put on and taking off regular upper body clothing? 2  Help from another person to put on and taking off regular lower body clothing? 1  6 Click Score 11  Progressive Mobility  What is the highest level of mobility based on the progressive mobility assessment? Level 1 (Bedfast) - Unable to balance while sitting on edge of bed  Mobility Sit up in bed/chair position for meals  OT Recommendation  Follow Up Recommendations Skilled nursing-short term rehab (<3 hours/day)  Assistance recommended at discharge Frequent or constant Supervision/Assistance  Functional Status Assessent Patient has had a recent decline in their functional status and/or demonstrates limited ability to make significant improvements in function in a reasonable and predictable amount of time  Individuals Consulted  Consulted and Agree with Results and Recommendations Patient  Acute Rehab OT Goals  Patient Stated Goal none stated  OT Goal Formulation With patient  Time For Goal Achievement 07/17/21  Potential to Achieve Goals Fair  OT Time Calculation  OT Start Time (ACUTE ONLY) 1033  OT Stop Time (ACUTE ONLY) 1052  OT Time Calculation (min) 19 min  OT General Charges  $OT Visit 1 Visit  OT Evaluation  $OT Eval Moderate Complexity 1 Mod  Written Expression  Dominant Hand Right  Maurie Boettcher, OT/L   Acute OT Clinical Specialist Acute Rehabilitation Services Pager [phone] Office [phone]

## 2021-07-03 NOTE — Care Management Important Message (Signed)
Important Message  Patient Details  Name: Stephen Whitaker MRN: [id number] Date of Birth: 24-Oct-1955   Medicare Important Message Given:  Yes     Dillin Lofgren [address] 07/03/2021, 4:31 PM

## 2021-07-03 NOTE — Progress Notes (Signed)
Daily Progress Note   Patient Name: Stephen Whitaker       Date: 07/03/2021 DOB: 13-Feb-1956  Age: 65 y.o. MRN#: [id number] Attending Physician: Jonetta Osgood, MD Primary Care Physician: [name], NP Admit Date: 06/30/2021  Reason for Consultation/Follow-up: Establishing goals of care  Subjective: Medical records reviewed. Patient assessed at the bedside. He denies dyspnea, pain or distress. His ex-wife is present at the bedside, attempting to encourage him to eat.  Created space and opportunity for patient's thoughts and feelings on his current illness. Explored his interest in a transition to comfort focused care rather than current emphasis on nutrition, vital signs, labs etc and described what this entails including symptom management, liberalized visitation, and discontinuation of medications not aimed at provided comfort. Stephen Whitaker expresses a clear desire to focus on his comfort and rest, eating as he is able and interested rather than with constant encouragement. Counseled patient and explained that without life-prolonging interventions and sufficient nutrition to sustain him, his prognosis is even more limited. Explained expected trajectory at end of life. While Stephen Whitaker does not want to expedite his death, he feels tired of "always repeating the same thing" and accepts whatever time he has, allowing his body to rest and eventually a natural and peaceful death. Stephen Whitaker shares that he would rather be buried with a funeral service as opposed to cremation, with 27's soul music playing rather than [place] or gospel music. He does not have a preference for whether he will return to SNF with hospice or residential hospice.  I then called patient's son Stephen Whitaker to provide updates given patient's  statements today. Reviewed the above discussion and Stephen Whitaker shares he is in agreement, as patient's demise is "inevitable." Discussed comfort care and discontinuation of life-prolonging medical interventions, options including SNF with hospice and residential hospice. Given patient's severe failure to thrive and weeks of poor intake, he would likely be eligible for [facility] ([place]).  Questions and concerns addressed. PMT will continue to support holistically.  Length of Stay: 3  Current Medications: Scheduled Meds:   (feeding supplement) PROSource Plus  30 mL Oral TID BM   vitamin C  1,000 mg Oral Daily   carvedilol  3.125 mg Oral BID WC   Chlorhexidine Gluconate Cloth  6 each Topical Daily   enoxaparin (LOVENOX) injection  70  mg Subcutaneous Q12H   fluticasone  1 spray Each Nare Daily   lactase  6,000 Units Oral TID WC   midodrine  5 mg Oral TID WC   mirtazapine  15 mg Oral QHS   montelukast  10 mg Oral QHS   multivitamin with minerals  1 tablet Oral Daily   nutrition supplement (JUVEN)  1 packet Oral BID BM   pantoprazole  40 mg Oral BID   tamsulosin  0.4 mg Oral QPC supper    Continuous Infusions:  ceFEPime (MAXIPIME) IV Stopped (07/03/21 0905)   dextrose 30 mL/hr at 07/03/21 1300    PRN Meds: acetaminophen **OR** acetaminophen, albuterol, azelastine, HYDROcodone-acetaminophen, loperamide, ondansetron **OR** ondansetron (ZOFRAN) IV  Physical Exam Vitals and nursing note reviewed.  Constitutional:      General: He is not in acute distress.    Appearance: He is ill-appearing.  Cardiovascular:     Rate and Rhythm: Normal rate.  Pulmonary:     Effort: Pulmonary effort is normal.  Skin:    General: Skin is warm and dry.  Neurological:     Mental Status: He is alert.            Vital Signs: BP 103/88 (BP Location: Right Arm)   Pulse 88   Temp 98.7 F (37.1 C) (Axillary)   Resp 18   Ht 6\' 2"  (1.88 m)   Wt 74.5 kg   SpO2 99%   BMI 21.09 kg/m  SpO2: SpO2: 99 % O2 Device:  O2 Device: Nasal Cannula O2 Flow Rate: O2 Flow Rate (L/min): 4 L/min  Intake/output summary:  Intake/Output Summary (Last 24 hours) at 07/03/2021 1332 Last data filed at 07/03/2021 1300 Gross per 24 hour  Intake 1314.49 ml  Output 700 ml  Net 614.49 ml   LBM: Last BM Date: 07/02/21 Baseline Weight: Weight: 90.7 kg Most recent weight: Weight: 74.5 kg       Palliative Assessment/Data: 20%      Patient Active Problem List   Diagnosis Date Noted   Altered mental status    Goals of care, counseling/discussion    Adjustment disorder with depressed mood 07/02/2021   Pulmonary embolism ([place]) 06/30/2021   Acute respiratory failure with hypoxia (HCC) 06/30/2021   Type 2 diabetes mellitus with hypoglycemia without coma ([place]) 06/30/2021   Failure to thrive in adult 06/30/2021   Urinary catheter in place 05/31/2021   Below-knee amputation of left lower extremity ([place]) 05/19/2021   Seasonal and perennial allergic rhinoconjunctivitis 05/08/2021   Oral thrush 05/08/2021   Pressure injury of skin 04/17/2021   Protein-calorie malnutrition, severe 04/17/2021   Osteomyelitis of left foot ([place]) 04/16/2021   Diabetic foot infection ([place])    Fatigue 02/22/2021   Nonadherence to medication 02/15/2021   Asthma 01/30/2021   Other adverse food reactions, not elsewhere classified, subsequent encounter 01/30/2021   LVH (left ventricular hypertrophy) 01/04/2021   Acute on chronic systolic heart failure ([place]) 12/08/2020   HFrEF (heart failure with reduced ejection fraction) ([place]) 10/14/2020   Nonspecific abnormal electrocardiogram (ECG) (EKG) 10/14/2020   Atrial tachycardia ([place]) 10/14/2020   Non-pressure chronic ulcer of other part of left foot limited to breakdown of skin ([place]) 10/12/2020   Leg swelling 10/06/2020   DOE (dyspnea on exertion) 10/06/2020   Thrombocytopenia ([place]) 06/26/2019   Acquired absence of other right toe(s) ([place]) 02/19/2018   Boil of buttock 10/24/2016   Amputated toe of  left foot ([place]) 10/19/2016   Amputated toe, right ([place]) 06/13/2016   Chronic bronchitis (  [place]) 11/24/2015   Other pancytopenia ([place]) 01/24/2015   Chronic pancreatitis ([place]) 01/24/2015   Neutropenia (HCC)    Iron deficiency anemia due to chronic blood loss    Leukopenia    Sepsis ([place]) 01/21/2015   Anemia 01/21/2015   Eczema 10/21/2014   Essential hypertension 10/21/2014   Vasovagal syncope 10/21/2014   Other chronic pancreatitis ([place]) 08/10/2014   COLD (chronic obstructive lung disease) ([place]) 08/10/2014   Chronic ethmoidal sinusitis 08/10/2014   Bronchitis 10/17/2010   PLANTAR FASCIITIS, LEFT 10/17/2010   OTHER NEUTROPENIA 04/21/2010   LEUKOPENIA, MILD 02/20/2010   LIVER FUNCTION TESTS, ABNORMAL, HX OF 02/16/2010   DENTAL CARIES 02/15/2010   ONYCHOMYCOSIS 07/05/2009   ALLERGIC URTICARIA 07/05/2009   PAIN IN LIMB 07/05/2009   ERECTILE DYSFUNCTION 05/25/2008   HSV 05/02/2007   CONDYLOMA ACUMINATUM 05/02/2007   Gastroesophageal reflux disease without esophagitis 05/02/2007    Palliative Care Assessment & Plan   Patient Profile: 65 y.o. male  with past medical history of  COPD, chronic systolic CHF (EF 96-22%), HTN, recent left BKA on 04/21/21 by dr. Sharol Given, T2DM, SVT, PVD, LUTS/BPH with chronic foley admitted on 06/30/2021 from SNF with sudden onset of shortness of breath, hypoxia and hypoglycemia.    Patient admitted for acute hypoxic respiratory failure due to PE and aspiration pneumonia, sepsis, poor oral intake/hypoglycemia. Refusing to eat or take medications. PMT has been consulted to assist with goals of care conversation.    Assessment: Goals of care conversation  Recommendations/Plan: Patient and family agreeable to transition to comfort care today d/c medications and interventions not focused on comfort, add PRN medications for comfort Ongoing discussions of SNF with hospice vs residential hospice Ongoing support from PMT   Goals of Care and Additional  Recommendations: Limitations on Scope of Treatment: Full Comfort Care   Prognosis:  < 2 weeks  Discharge Planning: To Be Determined  Care plan was discussed with Patient, son, ex-wife, Dr. Sloan Leiter  Total time: 38 minutes Greater than 50% of this time was spent in counseling and coordinating care related to the above assessment and plan.  Dorthy Cooler, PA-C Palliative Medicine Team Team phone # [phone]  Thank you for allowing the Palliative Medicine Team to assist in the care of this patient. Please utilize secure chat with additional questions, if there is no response within 30 minutes please call the above phone number.  Palliative Medicine Team providers are available by phone from 7am to 7pm daily and can be reached through the team cell phone.  Should this patient require assistance outside of these hours, please call the patient's attending physician.

## 2021-07-03 NOTE — Progress Notes (Signed)
Pharmacy Antibiotic Note- Follow-up  Stephen Whitaker is a 65 y.o. male admitted on 06/30/2021 with sepsis.  Pharmacy was consulted for vancomycin and cefepime dosing. *vancomycin was discontinued 11/12- MRSA negative by PCR and no current growth on micro's*  Plan:  Cefepime 2g IV q8h   Height: 6\' 2"  (188 cm) Weight: 74.5 kg (164 lb 3.9 oz) IBW/kg (Calculated) : 82.2  Temp (24hrs), Avg:98.1 F (36.7 C), Min:97.6 F (36.4 C), Max:98.7 F (37.1 C)  Recent Labs  Lab 06/28/21 0907 06/28/21 0925 06/30/21 1002 06/30/21 1004 06/30/21 1433 06/30/21 1822 07/01/21 0138 07/02/21 0158 07/03/21 0457  WBC 4.8  --  5.1  --   --   --  5.6 7.8 8.1  CREATININE 1.35* 1.20  --  1.23  --   --  1.09 1.04  --   LATICACIDVEN  --   --   --   --  4.9* 6.2*  --   --   --      Estimated Creatinine Clearance: 73.6 mL/min (by C-G formula based on SCr of 1.04 mg/dL).    Allergies  Allergen Reactions   Ciprofloxacin Rash and Other (See Comments)    SYNCOPE    Lactose Intolerance (Gi) Diarrhea   Shellfish Allergy Anaphylaxis   Sulfa Antibiotics Other (See Comments)    SYNCOPE DIZZINESS    American Cockroach    Cat Hair Extract    Dust Mite Extract    Molds & Smuts     Antimicrobials this admission: Vanc 11/11 >> 11/12 Cefepime 11/11 >>   Dose adjustments this admission: N/A  Microbiology results: 11/11 BCx: ng day 3 11/11 UCx: pseudomonas >100,000 CFU/mL (awaiting sensitivities) 11/9 Ucx: Citrobacter freundii (sensitive to cefepime), pseudomonas (sensitive to cefepime)  Thank you for allowing pharmacy to be a part of this patient's care.  Vaughan Basta BS, PharmD, BCPS Clinical Pharmacist  Main RX: [phone]

## 2021-07-03 NOTE — Evaluation (Signed)
Physical Therapy Evaluation Patient Details Name: Stephen Whitaker MRN: [id number] DOB: 1955-10-01 Today's Date: 07/03/2021  History of Present Illness  Stephen Whitaker is a 65 y.o. male with medical history significant of COPD, chronic systolic CHF (EF 40-98%), HTN, recent left BKA on 04/21/21 by dr. Sharol Given, T2DM, SVT, PVD, LUTS/BPH with chronic foley who presented from his SNF due to sudden onset of shortness of breath, hypoxia and hypoglycemia.  Found to have bilateral PE and signs of sepsis with FTT.  Clinical Impression  Patient needing max to total A of 2 for mobility up to EOB today.  Notes from admission in September this year indicate pt scooting OOB to recliner with +2 mod A but unable to stand.  Patient with limited motivation and note psych involved as well.  Feel palliative care consult also indicated.  Patient appropriate for trial of PT during acute stay to see if interventions by psych can help with improving participation for strengthening and mobilizing OOB in addition to nutrition concerns.  Current recommendation for SNF level rehab, but may need long term care if unable to progress with PT.        Recommendations for follow up therapy are one component of a multi-disciplinary discharge planning process, led by the attending physician.  Recommendations may be updated based on patient status, additional functional criteria and insurance authorization.  Follow Up Recommendations Skilled nursing-short term rehab (<3 hours/day)    Assistance Recommended at Discharge Frequent or constant Supervision/Assistance  Functional Status Assessment Patient has had a recent decline in their functional status and/or demonstrates limited ability to make significant improvements in function in a reasonable and predictable amount of time  Equipment Recommendations  None recommended by PT    Recommendations for Other Services       Precautions / Restrictions Precautions Precautions:  Fall Precaution Comments: L BKA, sacral ulcer, chronic foley      Mobility  Bed Mobility Overal bed mobility: Needs Assistance Bed Mobility: Supine to Sit;Sit to Supine     Supine to sit: Total assist;HOB elevated Sit to supine: Max assist;+2 for physical assistance   General bed mobility comments: increased time with cues to move legs toward EOB, but needing assist for each leg, assist to lift trunk using bed pad under shoulders as pt not initiating when given hand hold and asked to pull up; to supine pt assisting some laying on L elbow to lower trunk with cues and time and initating some with legs, but assist provided to lift legs into bed.    Transfers Overall transfer level: Needs assistance                 General transfer comment: lateral scoot along EOB toward HOB with +2 total A using bed pad with A for balance and cue for anterior lean    Ambulation/Gait                  Stairs            Wheelchair Mobility    Modified Rankin (Stroke Patients Only)       Balance Overall balance assessment: Needs assistance Sitting-balance support: Feet supported Sitting balance-Leahy Scale: Poor Sitting balance - Comments: leaning back at least mod A for most of session for sitting balance at EOB up for about 10 minutes with cues for head upright to look out of window, but not sustained, not attempting to hold himself up after cues for placing hands on EOB or on bed  rail to assist with balance Postural control: Posterior lean                                   Pertinent Vitals/Pain Pain Assessment: No/denies pain    Home Living Family/patient expects to be discharged to:: Skilled nursing facility                        Prior Function Prior Level of Function : Needs assist       Physical Assist : Mobility (physical);ADLs (physical) Mobility (physical): Transfers ADLs (physical): Toileting;Dressing;Bathing Mobility Comments:  reports 2 assist ADLs Comments: urinary catheter, had assist for cleaning after BM, assist for bathing, able to self feed     Hand Dominance   Dominant Hand: Right    Extremity/Trunk Assessment   Upper Extremity Assessment Upper Extremity Assessment: Defer to OT evaluation    Lower Extremity Assessment Lower Extremity Assessment: LLE deficits/detail;RLE deficits/detail RLE Deficits / Details: AAROM limited knee flexion wincing in pain with flexion past 70 and tight with some edema behind knee; strength hip flexion 2-/5, knee extension 2/5, ankle DF 3/5; boggy feel to heel RLE Sensation: history of peripheral neuropathy (reports intact sensation to light touch, but likely neuropathy) LLE Deficits / Details: AAROM limited knee flexion with BKA 2 months old, healed incision, but areas of demarcation along lower portion of residual limb concern for continued ischemia; knee flexion AAROM about 35, strength hip flexion 2-/5, knee extension 2+/5 LLE Sensation: history of peripheral neuropathy       Communication   Communication: No difficulties  Cognition Arousal/Alertness: Awake/alert Behavior During Therapy: WFL for tasks assessed/performed Overall Cognitive Status: No family/caregiver present to determine baseline cognitive functioning                                 General Comments: Oriented to month, year, location, states her because he is sick, slow to respond to commands and questions, decreased initiation for active movement        General Comments General comments (skin integrity, edema, etc.): sacral pad over documented wound, boggy feel to R heel (OT requesting Prevlon)    Exercises     Assessment/Plan    PT Assessment Patient needs continued PT services  PT Problem List Decreased strength;Decreased mobility;Decreased activity tolerance;Decreased balance;Decreased knowledge of use of DME       PT Treatment Interventions Patient/family  education;Therapeutic exercise;Therapeutic activities;DME instruction;Functional mobility training;Balance training    PT Goals (Current goals can be found in the Care Plan section)  Acute Rehab PT Goals Patient Stated Goal: None stated PT Goal Formulation: Patient unable to participate in goal setting Time For Goal Achievement: 07/17/21 Potential to Achieve Goals: Fair    Frequency Min 2X/week   Barriers to discharge        Co-evaluation               AM-PAC PT "6 Clicks" Mobility  Outcome Measure Help needed turning from your back to your side while in a flat bed without using bedrails?: A Lot Help needed moving from lying on your back to sitting on the side of a flat bed without using bedrails?: Total Help needed moving to and from a bed to a chair (including a wheelchair)?: Total Help needed standing up from a chair using your arms (e.g., wheelchair or bedside  chair)?: Total Help needed to walk in hospital room?: Total Help needed climbing 3-5 steps with a railing? : Total 6 Click Score: 7    End of Session   Activity Tolerance: Patient limited by fatigue Patient left: in bed;with call bell/phone within reach;with bed alarm set   PT Visit Diagnosis: Adult, failure to thrive (R62.7);Muscle weakness (generalized) (M62.81);Other abnormalities of gait and mobility (R26.89)    Time: 4834-7583 PT Time Calculation (min) (ACUTE ONLY): 43 min   Charges:   PT Evaluation $PT Eval Moderate Complexity: 1 Mod PT Treatments $Therapeutic Activity: 8-22 mins        Magda Kiel, PT Acute Rehabilitation Services [place]:[phone] Office:[phone] 07/03/2021   Reginia Naas 07/03/2021, 12:15 PM

## 2021-07-03 NOTE — Plan of Care (Signed)

## 2021-07-03 NOTE — Progress Notes (Addendum)
PROGRESS NOTE        PATIENT DETAILS Name: WHALEN TROMPETER Age: 65 y.o. Sex: male Date of Birth: [birth year] Admit Date: 06/30/2021 Admitting Physician Orma Flaming, MD NOB:SJGGEZM, [name], NP  Brief Narrative: Patient is a 65 y.o. male with history of HFrEF, PAD-recent left BKA (04/21/2021), HTN, DM-2, BPH-with chronic indwelling Foley catheter, COPD-transferred to the ED from SNF for shortness of breath, hypoglycemia-patient was found to have hypoxia due to PE and subsequently admitted to the hospitalist service.  See below for further details.  Subjective: A bit more awake and alert today.  Trying to eat breakfast.  Objective: Vitals: Blood pressure 103/88, pulse 88, temperature 98.7 F (37.1 C), temperature source Axillary, resp. rate 18, height 6\' 2"  (1.88 m), weight 74.5 kg, SpO2 99 %.   Exam: Gen Exam:Alert awake-not in any distress HEENT:atraumatic, normocephalic Chest: B/L clear to auscultation anteriorly CVS:S1S2 regular Abdomen:soft non tender, non distended Extremities:+ edema-s/p left BKA. Neurology: Non focal Skin: no rash   Pertinent Labs/Radiology: Recent Labs  Lab 06/28/21 0907 06/28/21 0925 06/30/21 1004 06/30/21 1430 07/01/21 0138 07/02/21 0158 07/03/21 0457  WBC 4.8   < >  --   --  5.6 7.8 8.1  HGB 15.4   < >  --    < > 12.2* 13.4 12.7*  PLT 258   < >  --   --  175 157 172  NA 136   < > 134*   < > 134* 132*  --   K 5.4*   < > 4.4   < > 3.9 4.9  --   CREATININE 1.35*   < > 1.23  --  1.09 1.04  --   AST 25  --  28  --  23 QUANTITY NOT SUFFICIENT, UNABLE TO PERFORM TEST  --   ALT 10  --  10  --  10 QUANTITY NOT SUFFICIENT, UNABLE TO PERFORM TEST  --   ALKPHOS 189*  --  189*  --  147* 127*  --   BILITOT 1.6*  --  1.6*  --  1.5* QUANTITY NOT SUFFICIENT, UNABLE TO PERFORM TEST  --    < > = values in this interval not displayed.     11/09>> urine culture: Citrobacter/Pseudomonas 11/11>>Blood culture: No  growth 11/11>>Urine Culture: Pseudomonas  04/21>> Echo: EF 62-94%, RV systolic function reduced, RVSP 54.7. 11/11>> CT angio chest: Bilateral distal/central/segmental/subsegmental PE in all 5 lobes.  Left PNA. 11/12>> Echo: EF<20%, multiple LV thrombus.  Assessment/Plan: Acute hypoxic respiratory failure due to PE and aspiration pneumonia: Requiring on 3 L of oxygen-continue therapeutic anticoagulation and empiric IV antimicrobial therapy.  Attempt to titrate off oxygen over the next few days.    Pulmonary embolism: Continue therapeutic anticoagulation with Lovenox-Echo with no RV failure-awaiting lower extremity Doppler.  He may have an underlying hypercoagulable state-but suspect VT is likely provoked due to sedentary status.  Given poor overall prognosis-advanced heart failure etc.-he is not a candidate for aggressive care-furthermore further work-up will not change management as he probably needs indefinite anticoagulation.    Sepsis due to left-sided pneumonia: Sepsis physiology improving-likely aspiration pneumonitis given frailty/deconditioning-continue cefepime x5 days.    Hypoglycemia: Due to poor oral intake-oral intake continues to be erratic and mostly poor per nursing staff.  CBG readings from fingerstick is highly inaccurate due to severe Raynaud's phenomena-nursing staff aware  that if we do get a low CBG reading-we needed to confirm from serum blood glucose.  Supportive care continues-decrease D10 infusion to 20 cc today-try to encourage oral intake is much as possible.    Recent Labs    07/03/21 0712 07/03/21 0717 07/03/21 1135  GLUCAP 18* 93 96      HFrEF:-Does have some mild lower extremity edema-BP limits addition of other medications/diuretics-continue low-dose Coreg for today.  Remains on low-dose midodrine as well.  Reassess on 11/15.  History of SVT: Continue telemetry monitoring-resuming low-dose beta-blocker today.  LV thrombus: Seen on echo on 11/12-on full dose  anticoagulation.  Discussed case with patient's CHF MD-Dr. Pierre Bali on 11/14-recommendations are to continue with therapeutic Lovenox and transition to Eliquis when closer to discharge.  COPD: Not in exacerbation-continue bronchodilators  History of DM-2 (A1c 6.4): Given frailty/episodes of hypoglycemia-does not need any treatment for his DM-2.  Plan is to tolerate some amount of hyperglycemia once hypoglycemia has resolved.  Asymptomatic bacteriuria: Urine culture positive for Pseudomonas-has chronic indwelling Foley catheter-clinical features are not consistent with a UTI.  In any event he is already on cefepime.    PAD-s/p left BKA September 2022: Stump site appears unremarkable  History of BPH with chronic indwelling Foley catheter  GERD: Continue PPI  Failure to thrive syndrome/cachexia/deconditioning/debility: Very poor overall prognosis-he is incredibly frail-cachectic-and is slowly declining.  He has very poor functional status and is mostly bed to wheelchair bound.  Multiple discussions with family over the past few days he is-he is now a DNR-we will continue gentle medical treatment and see how he does-if he deteriorates significantly-he would need to be transition to comfort care.  If he stabilizes-he will likely discharge back to SNF with hospice/palliative care follow-up.    Pressure ulcer: Present prior to admission-awaiting wound care evaluation. Pressure Injury 04/16/21 Buttocks Left Stage 2 -  Partial thickness loss of dermis presenting as a shallow open injury with a red, pink wound bed without slough. (Active)  04/16/21 2302  Location: Buttocks  Location Orientation: Left  Staging: Stage 2 -  Partial thickness loss of dermis presenting as a shallow open injury with a red, pink wound bed without slough.  Wound Description (Comments):   Present on Admission: Yes     Pressure Injury 06/30/21 Buttocks Mid Stage 2 -  Partial thickness loss of dermis presenting as a shallow  open injury with a red, pink wound bed without slough. tiny open area mid of buttocks(coccyx); surrounding area denuded (Active)  06/30/21 2200  Location: Buttocks  Location Orientation: Mid  Staging: Stage 2 -  Partial thickness loss of dermis presenting as a shallow open injury with a red, pink wound bed without slough.  Wound Description (Comments): tiny open area mid of buttocks(coccyx); surrounding area denuded  Present on Admission: Yes    BMI Estimated body mass index is 21.09 kg/m as calculated from the following:   Height as of this encounter: 6\' 2"  (1.88 m).   Weight as of this encounter: 74.5 kg.     Procedures: None Consults: Palliative DVT Prophylaxis: IV Heparin Code Status:Full code Family Communication: Son-Tyler-[phone]-updated over the phone- on 11/14.  Time spent: 35 minutes-Greater than 50% of this time was spent in counseling, explanation of diagnosis, planning of further management, and coordination of care.   Disposition Plan: Status is: Inpatient  Remains inpatient appropriate because: Hypoxia due to PE/pneumonia/hypoglycemia/severe failure to thrive syndrome/poor oral intake-not stable for discharge.  On full anticoagulation/IV antibiotics with  Diet: Diet Order             Diet regular Room service appropriate? Yes with Assist; Fluid consistency: Thin  Diet effective now                     Antimicrobial agents: Anti-infectives (From admission, onward)    Start     Dose/Rate Route Frequency Ordered Stop   07/01/21 0600  vancomycin (VANCOREADY) IVPB 1000 mg/200 mL  Status:  Discontinued        1,000 mg 200 mL/hr over 60 Minutes Intravenous Every 12 hours 06/30/21 1957 07/01/21 0936   07/01/21 0030  ceFEPIme (MAXIPIME) 2 g in sodium chloride 0.9 % 100 mL IVPB        2 g 200 mL/hr over 30 Minutes Intravenous Every 8 hours 06/30/21 1957     07/01/21 0000  metroNIDAZOLE (FLAGYL) IVPB 500 mg  Status:  Discontinued        500 mg 100  mL/hr over 60 Minutes Intravenous Every 12 hours 06/30/21 1954 06/30/21 1955   06/30/21 1615  ceFEPIme (MAXIPIME) 2 g in sodium chloride 0.9 % 100 mL IVPB        2 g 200 mL/hr over 30 Minutes Intravenous  Once 06/30/21 1609 06/30/21 1719   06/30/21 1615  metroNIDAZOLE (FLAGYL) IVPB 500 mg        500 mg 100 mL/hr over 60 Minutes Intravenous  Once 06/30/21 1609 06/30/21 1730   06/30/21 1615  vancomycin (VANCOREADY) IVPB 1750 mg/350 mL        1,750 mg 175 mL/hr over 120 Minutes Intravenous  Once 06/30/21 1609 06/30/21 1938        MEDICATIONS: Scheduled Meds:  (feeding supplement) PROSource Plus  30 mL Oral TID BM   vitamin C  1,000 mg Oral Daily   carvedilol  3.125 mg Oral BID WC   Chlorhexidine Gluconate Cloth  6 each Topical Daily   enoxaparin (LOVENOX) injection  70 mg Subcutaneous Q12H   fluticasone  1 spray Each Nare Daily   lactase  6,000 Units Oral TID WC   midodrine  5 mg Oral TID WC   mirtazapine  15 mg Oral QHS   montelukast  10 mg Oral QHS   multivitamin with minerals  1 tablet Oral Daily   nutrition supplement (JUVEN)  1 packet Oral BID BM   pantoprazole  40 mg Oral BID   tamsulosin  0.4 mg Oral QPC supper   Continuous Infusions:  ceFEPime (MAXIPIME) IV Stopped (07/03/21 0905)   dextrose 30 mL/hr at 07/03/21 1300   PRN Meds:.acetaminophen **OR** acetaminophen, albuterol, azelastine, HYDROcodone-acetaminophen, loperamide, ondansetron **OR** ondansetron (ZOFRAN) IV   I have personally reviewed following labs and imaging studies  LABORATORY DATA: CBC: Recent Labs  Lab 06/28/21 0907 06/28/21 0925 06/30/21 1002 06/30/21 1430 06/30/21 1443 07/01/21 0138 07/02/21 0158 07/03/21 0457  WBC 4.8  --  5.1  --   --  5.6 7.8 8.1  NEUTROABS 3.6  --  3.7  --   --   --   --   --   HGB 15.4   < > 14.6 15.0 16.7 12.2* 13.4 12.7*  HCT 50.3   < > 48.5 44.0 49.0 38.5* 43.6 41.9  MCV 89.5  --  92.7  --   --  88.1 89.3 89.7  PLT 258  --  197  --   --  175 157 172   < > =  values in this interval not displayed.  Basic Metabolic Panel: Recent Labs  Lab 06/28/21 0907 06/28/21 0925 06/30/21 1004 06/30/21 1430 06/30/21 1443 06/30/21 2213 07/01/21 0138 07/01/21 1801 07/02/21 0158  NA 136 138 134* 136 136  --  134*  --  132*  K 5.4* 5.4* 4.4 4.4 4.3  --  3.9  --  4.9  CL 99 103 102  --   --   --  101  --  102  CO2 24  --  19*  --   --   --  23  --  17*  GLUCOSE 112* 107* 102*  --   --   --  146* 121* 101*  BUN 56* 67* 56*  --   --   --  56*  --  53*  CREATININE 1.35* 1.20 1.23  --   --   --  1.09  --  1.04  CALCIUM 9.1  --  8.8*  --   --   --  8.3*  --  8.0*  MG  --   --   --   --   --  2.2  --   --   --      GFR: Estimated Creatinine Clearance: 73.6 mL/min (by C-G formula based on SCr of 1.04 mg/dL).  Liver Function Tests: Recent Labs  Lab 06/28/21 0907 06/30/21 1004 07/01/21 0138 07/02/21 0158  AST 25 28 23  QUANTITY NOT SUFFICIENT, UNABLE TO PERFORM TEST  ALT 10 10 10  QUANTITY NOT SUFFICIENT, UNABLE TO PERFORM TEST  ALKPHOS 189* 189* 147* 127*  BILITOT 1.6* 1.6* 1.5* QUANTITY NOT SUFFICIENT, UNABLE TO PERFORM TEST  PROT 7.9 7.6 6.4* 6.3*  ALBUMIN 2.4* 2.4* 2.0* 2.0*    Recent Labs  Lab 06/28/21 0907 06/30/21 1822  LIPASE 43 44    No results for input(s): AMMONIA in the last 168 hours.  Coagulation Profile: Recent Labs  Lab 06/30/21 1433  INR 1.9*     Cardiac Enzymes: No results for input(s): CKTOTAL, CKMB, CKMBINDEX, TROPONINI in the last 168 hours.  BNP (last 3 results) Recent Labs    10/24/20 1517 01/24/21 1106 02/22/21 1158  PROBNP 7,418* 22,081* 20,322*     Lipid Profile: No results for input(s): CHOL, HDL, LDLCALC, TRIG, CHOLHDL, LDLDIRECT in the last 72 hours.  Thyroid Function Tests: No results for input(s): TSH, T4TOTAL, FREET4, T3FREE, THYROIDAB in the last 72 hours.  Anemia Panel: No results for input(s): VITAMINB12, FOLATE, FERRITIN, TIBC, IRON, RETICCTPCT in the last 72 hours.  Urine  analysis:    Component Value Date/Time   COLORURINE AMBER (A) 06/30/2021 0954   APPEARANCEUR CLOUDY (A) 06/30/2021 0954   APPEARANCEUR Clear 01/23/2017 1650   LABSPEC 1.029 06/30/2021 0954   PHURINE 5.0 06/30/2021 0954   GLUCOSEU NEGATIVE 06/30/2021 0954   HGBUR LARGE (A) 06/30/2021 0954   BILIRUBINUR NEGATIVE 06/30/2021 0954   BILIRUBINUR Negative 01/23/2017 [place] 06/30/2021 0954   PROTEINUR 100 (A) 06/30/2021 0954   UROBILINOGEN 1.0 01/31/2021 2054   NITRITE NEGATIVE 06/30/2021 0954   LEUKOCYTESUR LARGE (A) 06/30/2021 0954    Sepsis Labs: Lactic Acid, Venous    Component Value Date/Time   LATICACIDVEN 6.2 ([place]) 06/30/2021 1822    MICROBIOLOGY: Recent Results (from the past 240 hour(s))  Urine Culture     Status: Abnormal   Collection Time: 06/28/21  9:13 AM   Specimen: Urine, Catheterized  Result Value Ref Range Status   Specimen Description URINE, CATHETERIZED  Final   Special Requests   Final  NONE Performed at [facility], [place] [address], [place], [place] [address]    Culture (A)  Final    >=100,000 COLONIES/mL CITROBACTER FREUNDII >=100,000 COLONIES/mL PSEUDOMONAS AERUGINOSA    Report Status 06/30/2021 FINAL  Final   Organism ID, Bacteria CITROBACTER FREUNDII (A)  Final   Organism ID, Bacteria PSEUDOMONAS AERUGINOSA (A)  Final      Susceptibility   Citrobacter freundii - MIC*    CEFAZOLIN >=64 RESISTANT Resistant     CEFEPIME <=0.12 SENSITIVE Sensitive     CEFTRIAXONE <=0.25 SENSITIVE Sensitive     CIPROFLOXACIN 1 RESISTANT Resistant     GENTAMICIN <=1 SENSITIVE Sensitive     IMIPENEM <=0.25 SENSITIVE Sensitive     NITROFURANTOIN <=16 SENSITIVE Sensitive     TRIMETH/SULFA >=320 RESISTANT Resistant     PIP/TAZO <=4 SENSITIVE Sensitive     * >=100,000 COLONIES/mL CITROBACTER FREUNDII   Pseudomonas aeruginosa - MIC*    CEFTAZIDIME 4 SENSITIVE Sensitive     CIPROFLOXACIN <=0.25 SENSITIVE Sensitive     GENTAMICIN <=1 SENSITIVE  Sensitive     IMIPENEM <=0.25 SENSITIVE Sensitive     PIP/TAZO 8 SENSITIVE Sensitive     CEFEPIME 4 SENSITIVE Sensitive     * >=100,000 COLONIES/mL PSEUDOMONAS AERUGINOSA  Resp Panel by RT-PCR (Flu A&B, Covid) Nasopharyngeal Swab     Status: None   Collection Time: 06/30/21  2:00 PM   Specimen: Nasopharyngeal Swab; Nasopharyngeal(NP) swabs in vial transport medium  Result Value Ref Range Status   SARS Coronavirus 2 by RT PCR NEGATIVE NEGATIVE Final    Comment: (NOTE) SARS-CoV-2 target nucleic acids are NOT DETECTED.  The SARS-CoV-2 RNA is generally detectable in upper respiratory specimens during the acute phase of infection. The lowest concentration of SARS-CoV-2 viral copies this assay can detect is 138 copies/mL. A negative result does not preclude SARS-Cov-2 infection and should not be used as the sole basis for treatment or other patient management decisions. A negative result may occur with  improper specimen collection/handling, submission of specimen other than nasopharyngeal swab, presence of viral mutation(s) within the areas targeted by this assay, and inadequate number of viral copies(<138 copies/mL). A negative result must be combined with clinical observations, patient history, and epidemiological information. The expected result is Negative.  Fact Sheet for Patients:  EntrepreneurPulse.com.au  Fact Sheet for Healthcare Providers:  IncredibleEmployment.be  This test is no t yet approved or cleared by the [place] FDA and  has been authorized for detection and/or diagnosis of SARS-CoV-2 by FDA under an Emergency Use Authorization (EUA). This EUA will remain  in effect (meaning this test can be used) for the duration of the COVID-19 declaration under Section 564(b)(1) of the Act, 21 U.S.C.section 360bbb-3(b)(1), unless the authorization is terminated  or revoked sooner.       Influenza A by PCR NEGATIVE NEGATIVE Final    Influenza B by PCR NEGATIVE NEGATIVE Final    Comment: (NOTE) The Xpert Xpress SARS-CoV-2/FLU/RSV plus assay is intended as an aid in the diagnosis of influenza from Nasopharyngeal swab specimens and should not be used as a sole basis for treatment. Nasal washings and aspirates are unacceptable for Xpert Xpress SARS-CoV-2/FLU/RSV testing.  Fact Sheet for Patients: EntrepreneurPulse.com.au  Fact Sheet for Healthcare Providers: IncredibleEmployment.be  This test is not yet approved or cleared by the [place] FDA and has been authorized for detection and/or diagnosis of SARS-CoV-2 by FDA under an Emergency Use Authorization (EUA). This EUA will remain in effect (meaning this test  can be used) for the duration of the COVID-19 declaration under Section 564(b)(1) of the Act, 21 U.S.C. section 360bbb-3(b)(1), unless the authorization is terminated or revoked.  Performed at [facility], [place] [address] [place], [place] [address]   Urine Culture     Status: Abnormal   Collection Time: 06/30/21  2:13 PM   Specimen: In/Out Cath Urine  Result Value Ref Range Status   Specimen Description IN/OUT CATH URINE  Final   Special Requests   Final    Normal Performed at [facility], [place] [address], [place], [place] [address]    Culture >=100,000 COLONIES/mL PSEUDOMONAS AERUGINOSA (A)  Final   Report Status 07/03/2021 FINAL  Final   Organism ID, Bacteria PSEUDOMONAS AERUGINOSA (A)  Final      Susceptibility   Pseudomonas aeruginosa - MIC*    CEFTAZIDIME 4 SENSITIVE Sensitive     CIPROFLOXACIN <=0.25 SENSITIVE Sensitive     GENTAMICIN <=1 SENSITIVE Sensitive     IMIPENEM <=0.25 SENSITIVE Sensitive     PIP/TAZO 8 SENSITIVE Sensitive     CEFEPIME 2 SENSITIVE Sensitive     * >=100,000 COLONIES/mL PSEUDOMONAS AERUGINOSA  Blood Culture (routine x 2)     Status: None (Preliminary result)   Collection Time: 06/30/21  2:30 PM   Specimen: BLOOD  LEFT WRIST  Result Value Ref Range Status   Specimen Description BLOOD LEFT WRIST  Final   Special Requests   Final    BOTTLES DRAWN AEROBIC AND ANAEROBIC Blood Culture results may not be optimal due to an inadequate volume of blood received in culture bottles   Culture   Final    NO GROWTH 3 DAYS Performed at [facility], [place] [address], [place], [place] [address]    Report Status PENDING  Incomplete  Blood Culture (routine x 2)     Status: None (Preliminary result)   Collection Time: 06/30/21  6:19 PM   Specimen: BLOOD RIGHT FOREARM  Result Value Ref Range Status   Specimen Description BLOOD RIGHT FOREARM  Final   Special Requests   Final    BOTTLES DRAWN AEROBIC AND ANAEROBIC Blood Culture results may not be optimal due to an inadequate volume of blood received in culture bottles   Culture   Final    NO GROWTH 3 DAYS Performed at [facility], [place] [address] [place], [place] [address]    Report Status PENDING  Incomplete  MRSA Next Gen by PCR, Nasal     Status: None   Collection Time: 07/01/21  1:44 AM   Specimen: Nasal Mucosa; Nasal Swab  Result Value Ref Range Status   MRSA by PCR Next Gen NOT DETECTED NOT DETECTED Final    Comment: (NOTE) The GeneXpert MRSA Assay (FDA approved for NASAL specimens only), is one component of a comprehensive MRSA colonization surveillance program. It is not intended to diagnose MRSA infection nor to guide or monitor treatment for MRSA infections. Test performance is not FDA approved in patients less than 6 years old. Performed at [facility], [place] [address], [place],  [address]     RADIOLOGY STUDIES/RESULTS: ECHOCARDIOGRAM COMPLETE  Result Date: 07/01/2021    ECHOCARDIOGRAM REPORT   Patient Name:   CADARIUS NEVARES Date of Exam: 07/01/2021 Medical Rec #:  [id number]          Height:       74.0 in Accession #:    [id number]  Weight:       157.4 lb Date of Birth:  September 10, 1955          BSA:          1.963  m Patient Age:    77 years           BP:           108/85 mmHg Patient Gender: M                  HR:           93 bpm. Exam Location:  Inpatient Procedure: 2D Echo, Cardiac Doppler, Color Doppler and Intracardiac            Opacification Agent Indications:    CHF  History:        Patient has prior history of Echocardiogram examinations, most                 recent 12/08/2020. CHF, Signs/Symptoms:Syncope; Risk                 Factors:Hypertension. Anemia.  Sonographer:    Merrie Roof RDCS Referring Phys: [id number] [place]  1. Large LV thrombus burden, multiple large apical clot foci, largest 2 x 1 cm. . Left ventricular ejection fraction, by estimation, is <20%. The left ventricle has severely decreased function. The left ventricle demonstrates global hypokinesis. The left ventricular internal cavity size was moderately dilated. Left ventricular diastolic parameters are consistent with Grade III diastolic dysfunction (restrictive).  2. Right ventricular systolic function is normal. The right ventricular size is moderately enlarged.  3. Left atrial size was severely dilated.  4. Right atrial size was severely dilated.  5. The mitral valve is normal in structure. Mild to moderate mitral valve regurgitation. No evidence of mitral stenosis.  6. Tricuspid valve regurgitation is severe.  7. The aortic valve is calcified. There is moderate calcification of the aortic valve. Aortic valve regurgitation is mild. Aortic valve sclerosis/calcification is present, without any evidence of aortic stenosis.  8. The inferior vena cava is dilated in size with <50% respiratory variability, suggesting right atrial pressure of 15 mmHg. Comparison(s): LV thrombus burden is new when compared to prior. Discussed with primary team. On anticoagulation. FINDINGS  Left Ventricle: Large LV thrombus burden, multiple large apical clot foci, largest 2 x 1 cm. Left ventricular ejection fraction, by estimation, is <20%. The left  ventricle has severely decreased function. The left ventricle demonstrates global hypokinesis. The left ventricular internal cavity size was moderately dilated. There is no left ventricular hypertrophy. Left ventricular diastolic parameters are consistent with Grade III diastolic dysfunction (restrictive). Right Ventricle: The right ventricular size is moderately enlarged. No increase in right ventricular wall thickness. Right ventricular systolic function is normal. Left Atrium: Left atrial size was severely dilated. Right Atrium: Right atrial size was severely dilated. Pericardium: There is no evidence of pericardial effusion. Mitral Valve: The mitral valve is normal in structure. Mild to moderate mitral valve regurgitation. No evidence of mitral valve stenosis. Tricuspid Valve: The tricuspid valve is normal in structure. Tricuspid valve regurgitation is severe. No evidence of tricuspid stenosis. Aortic Valve: The aortic valve is calcified. There is moderate calcification of the aortic valve. Aortic valve regurgitation is mild. Aortic valve sclerosis/calcification is present, without any evidence of aortic stenosis. Aortic valve mean gradient measures 2.0 mmHg. Aortic valve peak gradient measures 3.7 mmHg. Aortic valve area, by VTI measures 1.13 cm. Pulmonic Valve: The pulmonic valve was normal in structure.  Pulmonic valve regurgitation is not visualized. No evidence of pulmonic stenosis. Aorta: The aortic root is normal in size and structure. Venous: The inferior vena cava is dilated in size with less than 50% respiratory variability, suggesting right atrial pressure of 15 mmHg. IAS/Shunts: No atrial level shunt detected by color flow Doppler.  LEFT VENTRICLE PLAX 2D LVIDd:         5.40 cm LVIDs:         5.10 cm LV PW:         1.00 cm LV IVS:        0.70 cm LVOT diam:     2.00 cm LV SV:         17 LV SV Index:   9 LVOT Area:     3.14 cm  RIGHT VENTRICLE            IVC RV Basal diam:  4.90 cm    IVC diam: 2.30  cm RV Mid diam:    3.70 cm RV S prime:     7.07 cm/s TAPSE (M-mode): 1.3 cm LEFT ATRIUM              Index        RIGHT ATRIUM           Index LA diam:        4.40 cm  2.24 cm/m   RA Area:     35.10 cm LA Vol (A2C):   159.0 ml 80.99 ml/m  RA Volume:   153.00 ml 77.94 ml/m LA Vol (A4C):   192.0 ml 97.80 ml/m LA Biplane Vol: 178.0 ml 90.67 ml/m  AORTIC VALVE AV Area (Vmax):    1.22 cm AV Area (Vmean):   1.11 cm AV Area (VTI):     1.13 cm AV Vmax:           96.40 cm/s AV Vmean:          65.200 cm/s AV VTI:            0.154 m AV Peak Grad:      3.7 mmHg AV Mean Grad:      2.0 mmHg LVOT Vmax:         37.30 cm/s LVOT Vmean:        23.100 cm/s LVOT VTI:          0.055 m LVOT/AV VTI ratio: 0.36  AORTA Ao Root diam: 2.90 cm MITRAL VALVE               TRICUSPID VALVE MV Area (PHT): 7.02 cm    TR Peak grad:   31.4 mmHg MV Decel Time: 108 msec    TR Vmax:        280.00 cm/s MV E velocity: 45.80 cm/s MV A velocity: 25.70 cm/s  SHUNTS MV E/A ratio:  1.78        Systemic VTI:  0.06 m                            Systemic Diam: 2.00 cm Candee Furbish MD Electronically signed by Candee Furbish MD Signature Date/Time: 07/01/2021/5:49:04 PM    Final      LOS: 3 days   Oren Binet, MD  [facility]    To contact the attending provider between 7A-7P or the covering provider during after hours 7P-7A, please log into the web site [email or web address] and access using universal [place] password for that web site. If you do  not have the password, please call the hospital operator.  07/03/2021, 1:58 PM

## 2021-07-04 DIAGNOSIS — R627 Adult failure to thrive: Secondary | ICD-10-CM

## 2021-07-04 NOTE — Progress Notes (Addendum)
Manufacturing engineer [facility]) Hospital Liaison note.    Received request from [place] for family interest in [facility]. Spoke with family to confirm interest and answer questions. [facility] is unable to offer a room today. Eligibility has been confirmed.   Hospital Liaison will follow up tomorrow or sooner if a room becomes available.   Please do not hesitate to call with questions.    Thank you,   Farrel Gordon, RN, [facility] Liaison   [phone]

## 2021-07-04 NOTE — Progress Notes (Signed)
Daily Progress Note   Patient Name: Stephen Whitaker       Date: 07/04/2021 DOB: 11-08-1955  Age: 65 y.o. MRN#: [id number] Attending Physician: Jonetta Osgood, MD Primary Care Physician: [name], NP Admit Date: 06/30/2021  Reason for Consultation/Follow-up: Establishing goals of care  Subjective: Medical records reviewed. Patient assessed at the bedside. He denies dyspnea, pain or distress. Assisted with repositioning in bed and oriented patient. He states that he is in [place]. He has some leg pain with movements but does not feel he needs medication at this time.  Patient expressed interest in eating some of his food. Offered to assist and sat him up in bed with breaskfast tray. He then changes his mind and requests head of bed to be lowered back down.   Questions and concerns addressed. PMT will continue to support holistically.  Length of Stay: 4  Current Medications: Scheduled Meds:   Chlorhexidine Gluconate Cloth  6 each Topical Daily   fluticasone  1 spray Each Nare Daily   lactase  6,000 Units Oral TID WC   mirtazapine  15 mg Oral QHS   montelukast  10 mg Oral QHS   pantoprazole  40 mg Oral BID   tamsulosin  0.4 mg Oral QPC supper    PRN Meds: acetaminophen **OR** acetaminophen, albuterol, antiseptic oral rinse, azelastine, bisacodyl, glycopyrrolate **OR** glycopyrrolate **OR** glycopyrrolate, haloperidol **OR** haloperidol **OR** haloperidol lactate, loperamide, LORazepam **OR** LORazepam **OR** LORazepam, morphine injection, morphine CONCENTRATE **OR** morphine CONCENTRATE, ondansetron **OR** ondansetron (ZOFRAN) IV, polyvinyl alcohol  Physical Exam Vitals and nursing note reviewed.  Constitutional:      General: He is not in acute distress.     Appearance: He is ill-appearing.  Cardiovascular:     Rate and Rhythm: Normal rate.  Pulmonary:     Effort: Pulmonary effort is normal.  Skin:    General: Skin is warm and dry.  Neurological:     Mental Status: He is alert.            Vital Signs: BP (!) 119/91 (BP Location: Right Arm)   Pulse 94   Temp 98.3 F (36.8 C) (Oral)   Resp 17   Ht 6\' 2"  (1.88 m)   Wt 74.5 kg   SpO2 98%   BMI 21.09 kg/m  SpO2: SpO2: 98 %  O2 Device: O2 Device: Nasal Cannula O2 Flow Rate: O2 Flow Rate (L/min): 4 L/min  Intake/output summary:  Intake/Output Summary (Last 24 hours) at 07/04/2021 1246 Last data filed at 07/04/2021 0601 Gross per 24 hour  Intake 580.3 ml  Output 350 ml  Net 230.3 ml    LBM: Last BM Date: 07/02/21 Baseline Weight: Weight: 90.7 kg Most recent weight: Weight: 74.5 kg       Palliative Assessment/Data: 20%      Patient Active Problem List   Diagnosis Date Noted   Altered mental status    Goals of care, counseling/discussion    Adjustment disorder with depressed mood 07/02/2021   Pulmonary embolism ([place]) 06/30/2021   Acute respiratory failure with hypoxia ([place]) 06/30/2021   Type 2 diabetes mellitus with hypoglycemia without coma ([place]) 06/30/2021   Failure to thrive in adult 06/30/2021   Urinary catheter in place 05/31/2021   Below-knee amputation of left lower extremity ([place]) 05/19/2021   Seasonal and perennial allergic rhinoconjunctivitis 05/08/2021   Oral thrush 05/08/2021   Pressure injury of skin 04/17/2021   Protein-calorie malnutrition, severe 04/17/2021   Osteomyelitis of left foot ([place]) 04/16/2021   Diabetic foot infection ([place])    Fatigue 02/22/2021   Nonadherence to medication 02/15/2021   Asthma 01/30/2021   Other adverse food reactions, not elsewhere classified, subsequent encounter 01/30/2021   LVH (left ventricular hypertrophy) 01/04/2021   Acute on chronic systolic heart failure ([place]) 12/08/2020   HFrEF (heart failure with reduced  ejection fraction) ([place]) 10/14/2020   Nonspecific abnormal electrocardiogram (ECG) (EKG) 10/14/2020   Atrial tachycardia ([place]) 10/14/2020   Non-pressure chronic ulcer of other part of left foot limited to breakdown of skin ([place]) 10/12/2020   Leg swelling 10/06/2020   DOE (dyspnea on exertion) 10/06/2020   Thrombocytopenia ([place]) 06/26/2019   Acquired absence of other right toe(s) ([place]) 02/19/2018   Boil of buttock 10/24/2016   Amputated toe of left foot ([place]) 10/19/2016   Amputated toe, right ([place]) 06/13/2016   Chronic bronchitis ([place]) 11/24/2015   Other pancytopenia ([place]) 01/24/2015   Chronic pancreatitis ([place]) 01/24/2015   Neutropenia (HCC)    Iron deficiency anemia due to chronic blood loss    Leukopenia    Sepsis ([place]) 01/21/2015   Anemia 01/21/2015   Eczema 10/21/2014   Essential hypertension 10/21/2014   Vasovagal syncope 10/21/2014   Other chronic pancreatitis ([place]) 08/10/2014   COLD (chronic obstructive lung disease) ([place]) 08/10/2014   Chronic ethmoidal sinusitis 08/10/2014   Bronchitis 10/17/2010   PLANTAR FASCIITIS, LEFT 10/17/2010   OTHER NEUTROPENIA 04/21/2010   LEUKOPENIA, MILD 02/20/2010   LIVER FUNCTION TESTS, ABNORMAL, HX OF 02/16/2010   DENTAL CARIES 02/15/2010   ONYCHOMYCOSIS 07/05/2009   ALLERGIC URTICARIA 07/05/2009   PAIN IN LIMB 07/05/2009   ERECTILE DYSFUNCTION 05/25/2008   HSV 05/02/2007   CONDYLOMA ACUMINATUM 05/02/2007   Gastroesophageal reflux disease without esophagitis 05/02/2007    Palliative Care Assessment & Plan   Patient Profile: 65 y.o. male  with past medical history of  COPD, chronic systolic CHF (EF 98-92%), HTN, recent left BKA on 04/21/21 by dr. Sharol Given, T2DM, SVT, PVD, LUTS/BPH with chronic foley admitted on 06/30/2021 from SNF with sudden onset of shortness of breath, hypoxia and hypoglycemia.    Patient admitted for acute hypoxic respiratory failure due to PE and aspiration pneumonia, sepsis, poor oral intake/hypoglycemia. Refusing to  eat or take medications. PMT has been consulted to assist with goals of care conversation.    Assessment: End of life care  Recommendations/Plan: Continue comfort care measures per [facility] Patient continues to have very poor nutritional intake; residential hospice appropriate and stable if eligibility is confirmed Appreciate TOC assistance with referral to [facility] Ongoing support from PMT   Goals of Care and Additional Recommendations: Limitations on Scope of Treatment: Full Comfort Care   Prognosis:  < 2 weeks  Discharge Planning: To Be Determined  Care plan was discussed with Patient  Total time: 25 minutes Greater than 50% of this time was spent in counseling and coordinating care related to the above assessment and plan.  Dorthy Cooler, PA-C Palliative Medicine Team Team phone # [phone]  Thank you for allowing the Palliative Medicine Team to assist in the care of this patient. Please utilize secure chat with additional questions, if there is no response within 30 minutes please call the above phone number.  Palliative Medicine Team providers are available by phone from 7am to 7pm daily and can be reached through the team cell phone.  Should this patient require assistance outside of these hours, please call the patient's attending physician.

## 2021-07-04 NOTE — TOC Initial Note (Signed)
Transition of Care [facility]) - Initial/Assessment Note    Patient Details  Name: Stephen Whitaker MRN: [id number] Date of Birth: 1955/10/31  Transition of Care [facility]) CM/SW Contact:    Carles Collet, RN Phone Number: 07/04/2021, 1:49 PM  Clinical Narrative:                 Patient admitted from [facility].  Now comfort care. Requested by MD to speak with patient re preference for SNF w Hospice care or Residential hospice. [place] prefers residential hospice care a [facility]. He granted permission to speak his family. I spoke with his son [name] who confirmed that their choice was for him to go to [facility]. Notified [place], [place], and [place] with [facility].   [place], [place] (Son)  [phone]   Expected Discharge Plan: [place] Barriers to Discharge: Hospice Bed not available   Patient Goals and CMS Choice Patient states their goals for this hospitalization and ongoing recovery are:: to go to hospice      Expected Discharge Plan and Services Expected Discharge Plan: [place]: Hospice and [place] Date [facility] Agency Contacted: 07/04/21 Time HH Agency Contacted: 36 Representative spoke with at [place]: Jhonnie Garner and [employer]  Prior Living Arrangements/Services                       Activities of Daily Living Home Assistive Devices/Equipment: Wheelchair ADL Screening (condition at time of admission) Patient's cognitive ability adequate to safely complete daily activities?: No Is the patient deaf or have difficulty hearing?: No Does the patient have difficulty seeing, even when wearing glasses/contacts?: No Does the patient have difficulty concentrating, remembering, or making decisions?: Yes Patient able to express need for assistance with ADLs?: No Does the patient have difficulty dressing or bathing?: Yes Independently performs ADLs?:  No Communication: Appropriate for developmental age Dressing (OT): Needs assistance Is this a change from baseline?: Pre-admission baseline Grooming: Needs assistance Is this a change from baseline?: Pre-admission baseline Feeding: Needs assistance Is this a change from baseline?: Pre-admission baseline Bathing: Needs assistance Is this a change from baseline?: Pre-admission baseline Toileting: Needs assistance Is this a change from baseline?: Pre-admission baseline In/Out Bed: Needs assistance Is this a change from baseline?: Pre-admission baseline Walks in Home: Needs assistance Is this a change from baseline?: Pre-admission baseline Does the patient have difficulty walking or climbing stairs?: Yes Weakness of Legs: Both Weakness of Arms/Hands: Both  Permission Sought/Granted                  Emotional Assessment              Admission diagnosis:  Pulmonary embolism ([place]) [I26.99] Hypoglycemia [E16.2] Hypoxia [R09.02] Sepsis ([place]) [A41.9] Altered mental status, unspecified altered mental status type [R41.82] Multiple subsegmental pulmonary emboli without acute cor pulmonale (HCC) [I26.94] Patient Active Problem List   Diagnosis Date Noted   Altered mental status    Goals of care, counseling/discussion    Adjustment disorder with depressed mood 07/02/2021   Pulmonary embolism ([place]) 06/30/2021   Acute respiratory failure with hypoxia ([place]) 06/30/2021   Type 2 diabetes mellitus with hypoglycemia without coma ([place]) 06/30/2021   Failure to thrive in adult 06/30/2021   Urinary catheter in place  05/31/2021   Below-knee amputation of left lower extremity ([place]) 05/19/2021   Seasonal and perennial allergic rhinoconjunctivitis 05/08/2021   Oral thrush 05/08/2021   Pressure injury of skin 04/17/2021   Protein-calorie malnutrition, severe 04/17/2021   Osteomyelitis of left foot ([place]) 04/16/2021   Diabetic foot infection ([place])    Fatigue 02/22/2021   Nonadherence to  medication 02/15/2021   Asthma 01/30/2021   Other adverse food reactions, not elsewhere classified, subsequent encounter 01/30/2021   LVH (left ventricular hypertrophy) 01/04/2021   Acute on chronic systolic heart failure ([place]) 12/08/2020   HFrEF (heart failure with reduced ejection fraction) ([place]) 10/14/2020   Nonspecific abnormal electrocardiogram (ECG) (EKG) 10/14/2020   Atrial tachycardia ([place]) 10/14/2020   Non-pressure chronic ulcer of other part of left foot limited to breakdown of skin ([place]) 10/12/2020   Leg swelling 10/06/2020   DOE (dyspnea on exertion) 10/06/2020   Thrombocytopenia ([place]) 06/26/2019   Acquired absence of other right toe(s) ([place]) 02/19/2018   Boil of buttock 10/24/2016   Amputated toe of left foot ([place]) 10/19/2016   Amputated toe, right ([place]) 06/13/2016   Chronic bronchitis ([place]) 11/24/2015   Other pancytopenia ([place]) 01/24/2015   Chronic pancreatitis ([place]) 01/24/2015   Neutropenia (HCC)    Iron deficiency anemia due to chronic blood loss    Leukopenia    Sepsis ([place]) 01/21/2015   Anemia 01/21/2015   Eczema 10/21/2014   Essential hypertension 10/21/2014   Vasovagal syncope 10/21/2014   Other chronic pancreatitis ([place]) 08/10/2014   COLD (chronic obstructive lung disease) ([place]) 08/10/2014   Chronic ethmoidal sinusitis 08/10/2014   Bronchitis 10/17/2010   PLANTAR FASCIITIS, LEFT 10/17/2010   OTHER NEUTROPENIA 04/21/2010   LEUKOPENIA, MILD 02/20/2010   LIVER FUNCTION TESTS, ABNORMAL, HX OF 02/16/2010   DENTAL CARIES 02/15/2010   ONYCHOMYCOSIS 07/05/2009   ALLERGIC URTICARIA 07/05/2009   PAIN IN LIMB 07/05/2009   ERECTILE DYSFUNCTION 05/25/2008   HSV 05/02/2007   CONDYLOMA ACUMINATUM 05/02/2007   Gastroesophageal reflux disease without esophagitis 05/02/2007   PCP:  [name], NP Pharmacy:   [place], [place] - [address] [place] [address] Phone: [phone] Fax:  [phone]     Social Determinants of Health (SDOH) Interventions    Readmission Risk Interventions No flowsheet data found.

## 2021-07-04 NOTE — Progress Notes (Signed)
PROGRESS NOTE        PATIENT DETAILS Name: Stephen Whitaker Age: 65 y.o. Sex: male Date of Birth: 06-03-1956 Admit Date: 06/30/2021 Admitting Physician Orma Flaming, MD YDX:AJOINOM, [name], NP  Brief Narrative: Patient is a 65 y.o. male with history of HFrEF, PAD-recent left BKA (04/21/2021), HTN, DM-2, BPH-with chronic indwelling Foley catheter, COPD-transferred to the ED from SNF for shortness of breath, hypoglycemia-patient was found to have hypoxia due to PE and subsequently admitted to the hospitalist service.  A transthoracic echo showed multiple large LV thrombus as well.  He was felt to have very poor overall prognosis given significant to thrive syndrome-poor oral intake-he was evaluated by palliative care-after extensive discussion with family/patient-he was transitioned to comfort care on 11/14.  See below for further details.  Subjective: Lying comfortably in bed.  Objective: Vitals: Blood pressure (!) 119/91, pulse 94, temperature 98.3 F (36.8 C), temperature source Oral, resp. rate 17, height 6\' 2"  (1.88 m), weight 74.5 kg, SpO2 98 %.   Exam: Gen Exam:Alert awake-not in any distress HEENT:atraumatic, normocephalic Chest: B/L clear to auscultation anteriorly CVS:S1S2 regular Abdomen:soft non tender, non distended Extremities:+ edema-s/p left BKA. Neurology: Non focal Skin: no rash   Pertinent Labs/Radiology: Recent Labs  Lab 06/28/21 0907 06/28/21 0925 06/30/21 1004 06/30/21 1430 07/01/21 0138 07/02/21 0158 07/03/21 0457  WBC 4.8   < >  --   --  5.6 7.8 8.1  HGB 15.4   < >  --    < > 12.2* 13.4 12.7*  PLT 258   < >  --   --  175 157 172  NA 136   < > 134*   < > 134* 132*  --   K 5.4*   < > 4.4   < > 3.9 4.9  --   CREATININE 1.35*   < > 1.23  --  1.09 1.04  --   AST 25  --  28  --  23 QUANTITY NOT SUFFICIENT, UNABLE TO PERFORM TEST  --   ALT 10  --  10  --  10 QUANTITY NOT SUFFICIENT, UNABLE TO PERFORM TEST  --   ALKPHOS 189*   --  189*  --  147* 127*  --   BILITOT 1.6*  --  1.6*  --  1.5* QUANTITY NOT SUFFICIENT, UNABLE TO PERFORM TEST  --    < > = values in this interval not displayed.     11/09>> urine culture: Citrobacter/Pseudomonas 11/11>>Blood culture: No growth 11/11>>Urine Culture: Pseudomonas  04/21>> Echo: EF 76-72%, RV systolic function reduced, RVSP 54.7. 11/11>> CT angio chest: Bilateral distal/central/segmental/subsegmental PE in all 5 lobes.  Left PNA. 11/12>> Echo: EF<20%, multiple LV thrombus.  Assessment/Plan: Acute hypoxic respiratory failure due to PE and aspiration pneumonia: Requiring on 3 L of oxygen-he was briefly managed with IV antibiotics-and therapeutic anticoagulation-given poor prognosis-and after extensive discussion with patient/family-he was transitioned to comfort measures on 11/14.  He is no longer on any antibiotics no he is he on full dose anticoagulation.     Pulmonary embolism: Treated with therapeutic anticoagulation-he was subsequently transitioned to full comfort measures on 11/14-he is no longer on anticoagulation.  Sepsis due to left-sided pneumonia: Sepsis physiology improved after initiation of antimicrobial therapy-suspicion for aspiration pneumonitis given frailty/deconditioning.  No longer on antibiotics as he was transitioned to comfort measures on 11/14.  Hypoglycemia: Due to poor oral intake-oral intake continues to be erratic and mostly poor per nursing staff.  CBG readings from fingerstick (has Raynaud's phenomenon) was felt to be inaccurate and did not correlate with serum blood glucose.  He was treated with D10 infusion-hypoglycemia stabilized-he was transitioned to comfort measures in 11/14-CBGs no longer being checked.    Recent Labs    07/03/21 0712 07/03/21 0717 07/03/21 1135  GLUCAP 18* 93 96      HFrEF: Not in exacerbation-frailty/low BP limited addition of diuretics and other medications.  No longer on Coreg.  Transitioned to comfort measures  on 11/14.     History of SVT: Coreg and monitor on telemetry briefly  LV thrombus: Seen on echo on 11/12-on full dose anticoagulation.  Discussed case with patient's CHF MD-Dr. Pierre Bali on 11/14-recommendations were to continue with therapeutic Lovenox and transition to Eliquis when closer to discharge.  He now has been transitioned to comfort measures and all anticoagulation has been discontinued.  COPD: Not in exacerbation-continue bronchodilators  History of DM-2 (A1c 6.4): Given frailty/episodes of hypoglycemia-does not need any treatment for his DM-2.  Plan is to tolerate some amount of hyperglycemia once hypoglycemia has resolved.  Asymptomatic bacteriuria: Urine culture positive for Pseudomonas-has chronic indwelling Foley catheter-clinical features are not consistent with a UTI.   PAD-s/p left BKA September 2022: Stump site appears unremarkable  History of BPH with chronic indwelling Foley catheter  GERD: Continue PPI  Failure to thrive syndrome/cachexia/deconditioning/debility: Felt to have very poor overall prognosis-incredibly frail/cachectic and clearly declining.  Very poor oral intake as well.  He unfortunately has multiple PEs and multiple LV thrombosis.  He appears to have very poor LV dysfunction and likely has end-stage CHF.  After extensive discussion with family-he was made a DNR-and then subsequently transitioned to full comfort measures on 11/14.  Suspect will be a good candidate for residential hospice.   Pressure ulcer: Present prior to admission-awaiting wound care evaluation. Pressure Injury 04/16/21 Buttocks Left Stage 2 -  Partial thickness loss of dermis presenting as a shallow open injury with a red, pink wound bed without slough. (Active)  04/16/21 2302  Location: Buttocks  Location Orientation: Left  Staging: Stage 2 -  Partial thickness loss of dermis presenting as a shallow open injury with a red, pink wound bed without slough.  Wound Description  (Comments):   Present on Admission: Yes     Pressure Injury 06/30/21 Buttocks Mid Stage 2 -  Partial thickness loss of dermis presenting as a shallow open injury with a red, pink wound bed without slough. tiny open area mid of buttocks(coccyx); surrounding area denuded (Active)  06/30/21 2200  Location: Buttocks  Location Orientation: Mid  Staging: Stage 2 -  Partial thickness loss of dermis presenting as a shallow open injury with a red, pink wound bed without slough.  Wound Description (Comments): tiny open area mid of buttocks(coccyx); surrounding area denuded  Present on Admission: Yes    BMI Estimated body mass index is 21.09 kg/m as calculated from the following:   Height as of this encounter: 6\' 2"  (1.88 m).   Weight as of this encounter: 74.5 kg.     Procedures: None Consults: Palliative DVT Prophylaxis: IV Heparin Code Status:Full code Family Communication: Son-[phone]-updated over the phone- on 11/14.  Time spent: 25 minutes-Greater than 50% of this time was spent in counseling, explanation of diagnosis, planning of further management, and coordination of care.   Disposition Plan: Status is: Inpatient  Remains inpatient  appropriate because: Hypoxia due to PE/pneumonia/hypoglycemia/severe failure to thrive syndrome/poor oral intake-not stable for discharge.  On full anticoagulation/IV antibiotics with   Diet: Diet Order             Diet regular Room service appropriate? Yes with Assist; Fluid consistency: Thin  Diet effective now                     Antimicrobial agents: Anti-infectives (From admission, onward)    Start     Dose/Rate Route Frequency Ordered Stop   07/01/21 0600  vancomycin (VANCOREADY) IVPB 1000 mg/200 mL  Status:  Discontinued        1,000 mg 200 mL/hr over 60 Minutes Intravenous Every 12 hours 06/30/21 1957 07/01/21 0936   07/01/21 0030  ceFEPIme (MAXIPIME) 2 g in sodium chloride 0.9 % 100 mL IVPB  Status:  Discontinued         2 g 200 mL/hr over 30 Minutes Intravenous Every 8 hours 06/30/21 1957 07/03/21 1610   07/01/21 0000  metroNIDAZOLE (FLAGYL) IVPB 500 mg  Status:  Discontinued        500 mg 100 mL/hr over 60 Minutes Intravenous Every 12 hours 06/30/21 1954 06/30/21 1955   06/30/21 1615  ceFEPIme (MAXIPIME) 2 g in sodium chloride 0.9 % 100 mL IVPB        2 g 200 mL/hr over 30 Minutes Intravenous  Once 06/30/21 1609 06/30/21 1719   06/30/21 1615  metroNIDAZOLE (FLAGYL) IVPB 500 mg        500 mg 100 mL/hr over 60 Minutes Intravenous  Once 06/30/21 1609 06/30/21 1730   06/30/21 1615  vancomycin (VANCOREADY) IVPB 1750 mg/350 mL        1,750 mg 175 mL/hr over 120 Minutes Intravenous  Once 06/30/21 1609 06/30/21 1938        MEDICATIONS: Scheduled Meds:  Chlorhexidine Gluconate Cloth  6 each Topical Daily   fluticasone  1 spray Each Nare Daily   lactase  6,000 Units Oral TID WC   mirtazapine  15 mg Oral QHS   montelukast  10 mg Oral QHS   pantoprazole  40 mg Oral BID   tamsulosin  0.4 mg Oral QPC supper   Continuous Infusions:   PRN Meds:.acetaminophen **OR** acetaminophen, albuterol, antiseptic oral rinse, azelastine, bisacodyl, glycopyrrolate **OR** glycopyrrolate **OR** glycopyrrolate, haloperidol **OR** haloperidol **OR** haloperidol lactate, loperamide, LORazepam **OR** LORazepam **OR** LORazepam, morphine injection, morphine CONCENTRATE **OR** morphine CONCENTRATE, ondansetron **OR** ondansetron (ZOFRAN) IV, polyvinyl alcohol   I have personally reviewed following labs and imaging studies  LABORATORY DATA: CBC: Recent Labs  Lab 06/28/21 0907 06/28/21 0925 06/30/21 1002 06/30/21 1430 06/30/21 1443 07/01/21 0138 07/02/21 0158 07/03/21 0457  WBC 4.8  --  5.1  --   --  5.6 7.8 8.1  NEUTROABS 3.6  --  3.7  --   --   --   --   --   HGB 15.4   < > 14.6 15.0 16.7 12.2* 13.4 12.7*  HCT 50.3   < > 48.5 44.0 49.0 38.5* 43.6 41.9  MCV 89.5  --  92.7  --   --  88.1 89.3 89.7  PLT 258  --   197  --   --  175 157 172   < > = values in this interval not displayed.     Basic Metabolic Panel: Recent Labs  Lab 06/28/21 0907 06/28/21 0925 06/30/21 1004 06/30/21 1430 06/30/21 1443 06/30/21 2213 07/01/21 0138 07/01/21 1801 07/02/21 0158  NA 136 138 134* 136 136  --  134*  --  132*  K 5.4* 5.4* 4.4 4.4 4.3  --  3.9  --  4.9  CL 99 103 102  --   --   --  101  --  102  CO2 24  --  19*  --   --   --  23  --  17*  GLUCOSE 112* 107* 102*  --   --   --  146* 121* 101*  BUN 56* 67* 56*  --   --   --  56*  --  53*  CREATININE 1.35* 1.20 1.23  --   --   --  1.09  --  1.04  CALCIUM 9.1  --  8.8*  --   --   --  8.3*  --  8.0*  MG  --   --   --   --   --  2.2  --   --   --      GFR: Estimated Creatinine Clearance: 73.6 mL/min (by C-G formula based on SCr of 1.04 mg/dL).  Liver Function Tests: Recent Labs  Lab 06/28/21 0907 06/30/21 1004 07/01/21 0138 07/02/21 0158  AST 25 28 23  QUANTITY NOT SUFFICIENT, UNABLE TO PERFORM TEST  ALT 10 10 10  QUANTITY NOT SUFFICIENT, UNABLE TO PERFORM TEST  ALKPHOS 189* 189* 147* 127*  BILITOT 1.6* 1.6* 1.5* QUANTITY NOT SUFFICIENT, UNABLE TO PERFORM TEST  PROT 7.9 7.6 6.4* 6.3*  ALBUMIN 2.4* 2.4* 2.0* 2.0*    Recent Labs  Lab 06/28/21 0907 06/30/21 1822  LIPASE 43 44    No results for input(s): AMMONIA in the last 168 hours.  Coagulation Profile: Recent Labs  Lab 06/30/21 1433  INR 1.9*     Cardiac Enzymes: No results for input(s): CKTOTAL, CKMB, CKMBINDEX, TROPONINI in the last 168 hours.  BNP (last 3 results) Recent Labs    10/24/20 1517 01/24/21 1106 02/22/21 1158  PROBNP 7,418* 22,081* 20,322*     Lipid Profile: No results for input(s): CHOL, HDL, LDLCALC, TRIG, CHOLHDL, LDLDIRECT in the last 72 hours.  Thyroid Function Tests: No results for input(s): TSH, T4TOTAL, FREET4, T3FREE, THYROIDAB in the last 72 hours.  Anemia Panel: No results for input(s): VITAMINB12, FOLATE, FERRITIN, TIBC, IRON, RETICCTPCT in  the last 72 hours.  Urine analysis:    Component Value Date/Time   COLORURINE AMBER (A) 06/30/2021 0954   APPEARANCEUR CLOUDY (A) 06/30/2021 0954   APPEARANCEUR Clear 01/23/2017 1650   LABSPEC 1.029 06/30/2021 0954   PHURINE 5.0 06/30/2021 0954   GLUCOSEU NEGATIVE 06/30/2021 0954   HGBUR LARGE (A) 06/30/2021 0954   BILIRUBINUR NEGATIVE 06/30/2021 0954   BILIRUBINUR Negative 01/23/2017 [place] 06/30/2021 0954   PROTEINUR 100 (A) 06/30/2021 0954   UROBILINOGEN 1.0 01/31/2021 2054   NITRITE NEGATIVE 06/30/2021 0954   LEUKOCYTESUR LARGE (A) 06/30/2021 0954    Sepsis Labs: Lactic Acid, Venous    Component Value Date/Time   LATICACIDVEN 6.2 ([place]) 06/30/2021 1822    MICROBIOLOGY: Recent Results (from the past 240 hour(s))  Urine Culture     Status: Abnormal   Collection Time: 06/28/21  9:13 AM   Specimen: Urine, Catheterized  Result Value Ref Range Status   Specimen Description URINE, CATHETERIZED  Final   Special Requests   Final    NONE Performed at [facility], [address], [place], [place] [address]    Culture (A)  Final    >=100,000  COLONIES/mL CITROBACTER FREUNDII >=100,000 COLONIES/mL PSEUDOMONAS AERUGINOSA    Report Status 06/30/2021 FINAL  Final   Organism ID, Bacteria CITROBACTER FREUNDII (A)  Final   Organism ID, Bacteria PSEUDOMONAS AERUGINOSA (A)  Final      Susceptibility   Citrobacter freundii - MIC*    CEFAZOLIN >=64 RESISTANT Resistant     CEFEPIME <=0.12 SENSITIVE Sensitive     CEFTRIAXONE <=0.25 SENSITIVE Sensitive     CIPROFLOXACIN 1 RESISTANT Resistant     GENTAMICIN <=1 SENSITIVE Sensitive     IMIPENEM <=0.25 SENSITIVE Sensitive     NITROFURANTOIN <=16 SENSITIVE Sensitive     TRIMETH/SULFA >=320 RESISTANT Resistant     PIP/TAZO <=4 SENSITIVE Sensitive     * >=100,000 COLONIES/mL CITROBACTER FREUNDII   Pseudomonas aeruginosa - MIC*    CEFTAZIDIME 4 SENSITIVE Sensitive     CIPROFLOXACIN <=0.25 SENSITIVE Sensitive      GENTAMICIN <=1 SENSITIVE Sensitive     IMIPENEM <=0.25 SENSITIVE Sensitive     PIP/TAZO 8 SENSITIVE Sensitive     CEFEPIME 4 SENSITIVE Sensitive     * >=100,000 COLONIES/mL PSEUDOMONAS AERUGINOSA  Resp Panel by RT-PCR (Flu A&B, Covid) Nasopharyngeal Swab     Status: None   Collection Time: 06/30/21  2:00 PM   Specimen: Nasopharyngeal Swab; Nasopharyngeal(NP) swabs in vial transport medium  Result Value Ref Range Status   SARS Coronavirus 2 by RT PCR NEGATIVE NEGATIVE Final    Comment: (NOTE) SARS-CoV-2 target nucleic acids are NOT DETECTED.  The SARS-CoV-2 RNA is generally detectable in upper respiratory specimens during the acute phase of infection. The lowest concentration of SARS-CoV-2 viral copies this assay can detect is 138 copies/mL. A negative result does not preclude SARS-Cov-2 infection and should not be used as the sole basis for treatment or other patient management decisions. A negative result may occur with  improper specimen collection/handling, submission of specimen other than nasopharyngeal swab, presence of viral mutation(s) within the areas targeted by this assay, and inadequate number of viral copies(<138 copies/mL). A negative result must be combined with clinical observations, patient history, and epidemiological information. The expected result is Negative.  Fact Sheet for Patients:  EntrepreneurPulse.com.au  Fact Sheet for Healthcare Providers:  IncredibleEmployment.be  This test is no t yet approved or cleared by the [place] FDA and  has been authorized for detection and/or diagnosis of SARS-CoV-2 by FDA under an Emergency Use Authorization (EUA). This EUA will remain  in effect (meaning this test can be used) for the duration of the COVID-19 declaration under Section 564(b)(1) of the Act, 21 U.S.C.section 360bbb-3(b)(1), unless the authorization is terminated  or revoked sooner.       Influenza A by PCR  NEGATIVE NEGATIVE Final   Influenza B by PCR NEGATIVE NEGATIVE Final    Comment: (NOTE) The Xpert Xpress SARS-CoV-2/FLU/RSV plus assay is intended as an aid in the diagnosis of influenza from Nasopharyngeal swab specimens and should not be used as a sole basis for treatment. Nasal washings and aspirates are unacceptable for Xpert Xpress SARS-CoV-2/FLU/RSV testing.  Fact Sheet for Patients: EntrepreneurPulse.com.au  Fact Sheet for Healthcare Providers: IncredibleEmployment.be  This test is not yet approved or cleared by the [place] FDA and has been authorized for detection and/or diagnosis of SARS-CoV-2 by FDA under an Emergency Use Authorization (EUA). This EUA will remain in effect (meaning this test can be used) for the duration of the COVID-19 declaration under Section 564(b)(1) of the Act, 21 U.S.C. section 360bbb-3(b)(1), unless the authorization is terminated  or revoked.  Performed at [facility], [place] [address], [place], [place] [address]   Urine Culture     Status: Abnormal   Collection Time: 06/30/21  2:13 PM   Specimen: In/Out Cath Urine  Result Value Ref Range Status   Specimen Description IN/OUT CATH URINE  Final   Special Requests   Final    Normal Performed at [facility], [place] [address] [place], [place] [address]    Culture >=100,000 COLONIES/mL PSEUDOMONAS AERUGINOSA (A)  Final   Report Status 07/03/2021 FINAL  Final   Organism ID, Bacteria PSEUDOMONAS AERUGINOSA (A)  Final      Susceptibility   Pseudomonas aeruginosa - MIC*    CEFTAZIDIME 4 SENSITIVE Sensitive     CIPROFLOXACIN <=0.25 SENSITIVE Sensitive     GENTAMICIN <=1 SENSITIVE Sensitive     IMIPENEM <=0.25 SENSITIVE Sensitive     PIP/TAZO 8 SENSITIVE Sensitive     CEFEPIME 2 SENSITIVE Sensitive     * >=100,000 COLONIES/mL PSEUDOMONAS AERUGINOSA  Blood Culture (routine x 2)     Status: None (Preliminary result)   Collection Time: 06/30/21   2:30 PM   Specimen: BLOOD LEFT WRIST  Result Value Ref Range Status   Specimen Description BLOOD LEFT WRIST  Final   Special Requests   Final    BOTTLES DRAWN AEROBIC AND ANAEROBIC Blood Culture results may not be optimal due to an inadequate volume of blood received in culture bottles   Culture   Final    NO GROWTH 3 DAYS Performed at [facility], [place] [address] [place], [place] [address]    Report Status PENDING  Incomplete  Blood Culture (routine x 2)     Status: None (Preliminary result)   Collection Time: 06/30/21  6:19 PM   Specimen: BLOOD RIGHT FOREARM  Result Value Ref Range Status   Specimen Description BLOOD RIGHT FOREARM  Final   Special Requests   Final    BOTTLES DRAWN AEROBIC AND ANAEROBIC Blood Culture results may not be optimal due to an inadequate volume of blood received in culture bottles   Culture   Final    NO GROWTH 3 DAYS Performed at [facility], [place] [address], [place], [place] [address]    Report Status PENDING  Incomplete  MRSA Next Gen by PCR, Nasal     Status: None   Collection Time: 07/01/21  1:44 AM   Specimen: Nasal Mucosa; Nasal Swab  Result Value Ref Range Status   MRSA by PCR Next Gen NOT DETECTED NOT DETECTED Final    Comment: (NOTE) The GeneXpert MRSA Assay (FDA approved for NASAL specimens only), is one component of a comprehensive MRSA colonization surveillance program. It is not intended to diagnose MRSA infection nor to guide or monitor treatment for MRSA infections. Test performance is not FDA approved in patients less than 41 years old. Performed at [facility], [place] [address], [place], [place] [address]     RADIOLOGY STUDIES/RESULTS: No results found.   LOS: 4 days   Oren Binet, MD  [facility]    To contact the attending provider between 7A-7P or the covering provider during after hours 7P-7A, please log into the web site [email or web address] and access using universal [place] password for  that web site. If you do not have the password, please call the hospital operator.  07/04/2021, 12:14 PM

## 2021-07-05 LAB — CULTURE, BLOOD (ROUTINE X 2)
Culture: NO GROWTH
Culture: NO GROWTH

## 2021-07-05 MED ORDER — MORPHINE SULFATE (CONCENTRATE) 10 MG/0.5ML PO SOLN: 5.0000 mg | ORAL | 0 refills | Status: AC | PRN

## 2021-07-05 MED ORDER — LORAZEPAM 1 MG PO TABS: 1.0000 mg | ORAL_TABLET | ORAL | 0 refills | Status: AC | PRN

## 2021-07-05 MED ORDER — MORPHINE SULFATE (PF) 2 MG/ML IV SOLN: 1.0000 mg | INTRAVENOUS | 0 refills | Status: AC | PRN

## 2021-07-05 NOTE — TOC Transition Note (Signed)
Transition of Care [facility]) - CM/SW Discharge Note   Patient Details  Name: Stephen Whitaker MRN: [id number] Date of Birth: [birth year]  Transition of Care [facility]) CM/SW Contact:  Benard Halsted, LCSW Phone Number: 07/05/2021, 1:23 PM   Clinical Narrative:    Patient will DC to: [facility] Anticipated DC date: 07/05/21 Family notified: Son, [name] Transport by: Corey Harold   Per MD patient ready for DC to Hospice. RN to call report prior to discharge [phone]). RN, patient, patient's family, and facility notified of DC. Discharge Summary sent to facility. DC packet on chart with signed DNR. Ambulance transport requested for patient.   CSW will sign off for now as social work intervention is no longer needed. Please consult [place] again if new needs arise.     Final next level of care: [place] Barriers to Discharge: Barriers Resolved   Patient Goals and CMS Choice Patient states their goals for this hospitalization and ongoing recovery are:: to go to hospice CMS Medicare.gov Compare Post Acute Care list provided to:: Patient Represenative (must comment) Choice offered to / list presented to : Adult Children  Discharge Placement                Patient to be transferred to facility by: [place] Name of family member notified: [name] Patient and family notified of of transfer: 07/05/21  Discharge Plan and [place] Date [place]: 07/04/21 Time [place]: 4239 Representative spoke with at [place]: Jhonnie Garner and [place] ([place]) Interventions     Readmission Risk Interventions No flowsheet data found.

## 2021-07-05 NOTE — Consult Note (Signed)
[facility] Inpatient Consult   07/05/2021  SAATVIK THIELMAN 1956-01-31 [id number]  [facility] [ACO] Patient: Medicare  Risk score for unplanned readmission: extreme high risk  Reviewed for disposition patient to transition to [employer]  Will sign off at transition need to be met at hospice level of care  Natividad Brood, RN BSN [facility] Liaison  [phone] business mobile phone Toll free office [phone]  Fax number: [phone] [email or web address]

## 2021-07-05 NOTE — Progress Notes (Signed)
[facility] ([facility]) Hospital Liaison note.     This patient is approved to transfer to [facility] today.    [facility] will notify TOC when registration paperwork has been completed to arrange transport.    RN please call report to [phone].   Thank you,     Mary Anne Robertson, RN, CCM       [facility] Hospital Liaison  [phone] 

## 2021-07-05 NOTE — Progress Notes (Signed)
Stephen Whitaker is alert with no complaints of pain. Continues on oxygen via [place]. Notified patient of placement for [facility], stated understanding. PTAR collected discharge packet and all of patients belongings.This nurse d/c PIV's with catheter intact. Foley catheter collection bag emptied.PTAR placed pt on oxygen via  at 4L. Transferred from bed to stretcher by 2 PTAR attendants with no issues.

## 2021-07-05 NOTE — Progress Notes (Signed)
After reviewing the patient's chart, epic notes, labs, and imaging, I assessed the patient at bedside.  Patient was sitting up in bed having his teeth brushed by the bedside nurse.  Patient had no complaints of dyspnea, pain, or anxiety.  Patient verbalized understanding he is being transferred to a hospice house.  He had no acute complaints at this time.  Palliative medicine will continue to monitor the patient throughout his hospitalization though patient has discharge summary and orders in place to be transferred to [facility] today.    [place] Ilsa Iha, FNP-BC Palliative Medicine Team Team Phone # [phone]   NO CHARGE

## 2021-07-05 NOTE — Discharge Summary (Signed)
PATIENT DETAILS Name: Stephen Whitaker Age: 65 y.o. Sex: male Date of Birth: May 16, 1956 MRN: [id number]. Admitting Physician: Orma Flaming, MD EPP:IRJJOAC, [name], NP  Admit Date: 06/30/2021 Discharge date: 07/05/2021  Recommendations for Outpatient Follow-up:  Optimize comfort care.  Admitted From:  SNF  Disposition: Spring: No  Equipment/Devices: None  Discharge Condition: Stable  CODE STATUS: DNR  Diet recommendation:  Diet Order             Diet - low sodium heart healthy           Diet regular Room service appropriate? Yes with Assist; Fluid consistency: Thin  Diet effective now                    Brief Summary: Patient is a 65 y.o. male with history of HFrEF, PAD-recent left BKA (04/21/2021), HTN, DM-2, BPH-with chronic indwelling Foley catheter, COPD-transferred to the ED from SNF for shortness of breath, hypoglycemia-patient was found to have hypoxia due to PE and subsequently admitted to the hospitalist service.  A transthoracic echo showed multiple large LV thrombus as well.  He was felt to have very poor overall prognosis given significant to thrive syndrome-poor oral intake-he was evaluated by palliative care-after extensive discussion with family/patient-he was transitioned to comfort care on 11/14.  See below for further details.  Pertinent Labs/Radiology: 11/09>> urine culture: Citrobacter/Pseudomonas 11/11>>Blood culture: No growth 11/11>>Urine Culture: Pseudomonas   04/21>> Echo: EF 16-60%, RV systolic function reduced, RVSP 54.7. 11/11>> CT angio chest: Bilateral distal/central/segmental/subsegmental PE in all 5 lobes.  Left PNA. 11/12>> Echo: EF<20%, multiple LV thrombus.    Brief Hospital Course: Acute hypoxic respiratory failure due to PE and aspiration pneumonia: Requiring on 3 L of oxygen-he was briefly managed with IV antibiotics-and therapeutic anticoagulation-given poor prognosis-and after  extensive discussion with patient/family-he was transitioned to comfort measures on 11/14.  He is no longer on any antibiotics nor he is he on full dose anticoagulation.      Pulmonary embolism: Treated with therapeutic anticoagulation-he was subsequently transitioned to full comfort measures on 11/14-he is no longer on anticoagulation.   Sepsis due to left-sided pneumonia: Sepsis physiology improved after initiation of antimicrobial therapy-suspicion for aspiration pneumonitis given frailty/deconditioning.  No longer on antibiotics as he was transitioned to comfort measures on 11/14.    Hypoglycemia: Due to poor oral intake-oral intake continues to be erratic and mostly poor per nursing staff.  CBG readings from fingerstick (has Raynaud's phenomenon) was felt to be inaccurate and did not correlate with serum blood glucose.  He was treated with D10 infusion-hypoglycemia stabilized-he was transitioned to comfort measures in 11/14-CBGs no longer being checked.    HFrEF: Not in exacerbation-frailty/low BP limited addition of diuretics and other medications.  No longer on Coreg.  Transitioned to comfort measures on 11/14.      History of SVT: Coreg and monitor on telemetry briefly   LV thrombus: Seen on echo on 11/12-on full dose anticoagulation.  Discussed case with patient's CHF MD-Dr. Pierre Bali on 11/14-recommendations were to continue with therapeutic Lovenox and transition to Eliquis when closer to discharge.  He now has been transitioned to comfort measures and all anticoagulation has been discontinued.   COPD: Not in exacerbation-continue bronchodilators   History of DM-2 (A1c 6.4): Given frailty/episodes of hypoglycemia-does not need any treatment for his DM-2.  Plan is to tolerate some amount of hyperglycemia once hypoglycemia has resolved.   Asymptomatic bacteriuria: Urine culture  positive for Pseudomonas-has chronic indwelling Foley catheter-clinical features are not consistent with a  UTI.    PAD-s/p left BKA September 2022: Stump site appears unremarkable   History of BPH with chronic indwelling Foley catheter   GERD: Continue PPI   Failure to thrive syndrome/cachexia/deconditioning/debility: Felt to have very poor overall prognosis-incredibly frail/cachectic and clearly declining.  Very poor oral intake as well.  He unfortunately has multiple PEs and multiple LV thrombosis.  He appears to have very poor LV dysfunction and likely has end-stage CHF.  After extensive discussion with family-he was made a DNR-and then subsequently transitioned to full comfort measures on 11/14.  Suspect will be a good candidate for residential hospice.    Pressure Ulcer: Pressure Injury 04/16/21 Buttocks Left Stage 2 -  Partial thickness loss of dermis presenting as a shallow open injury with a red, pink wound bed without slough. (Active)  04/16/21 2302  Location: Buttocks  Location Orientation: Left  Staging: Stage 2 -  Partial thickness loss of dermis presenting as a shallow open injury with a red, pink wound bed without slough.  Wound Description (Comments):   Present on Admission: Yes     Pressure Injury 06/30/21 Buttocks Mid Stage 2 -  Partial thickness loss of dermis presenting as a shallow open injury with a red, pink wound bed without slough. tiny open area mid of buttocks(coccyx); surrounding area denuded (Active)  06/30/21 2200  Location: Buttocks  Location Orientation: Mid  Staging: Stage 2 -  Partial thickness loss of dermis presenting as a shallow open injury with a red, pink wound bed without slough.  Wound Description (Comments): tiny open area mid of buttocks(coccyx); surrounding area denuded  Present on Admission: Yes   BMI Estimated body mass index is 21.09 kg/m as calculated from the following:   Height as of this encounter: 6\' 2"  (1.88 m).   Weight as of this encounter: 74.5 kg.    Nutrition Status: Nutrition Problem: Inadequate oral intake Etiology: decreased  appetite Signs/Symptoms: per patient/family report, percent weight loss Percent weight loss: 21.2 % Interventions: MVI, Juven, Magic cup, Hormel Shake, Liberalize Diet, Refer to RD note for recommendations   Procedures None  Discharge Diagnoses:  Principal Problem:   Adjustment disorder with depressed mood Active Problems:   Gastroesophageal reflux disease without esophagitis   Essential hypertension   Sepsis ([place])   Chronic bronchitis ([place])   HFrEF (heart failure with reduced ejection fraction) ([place])   Pulmonary embolism ([facility])   Acute respiratory failure with hypoxia ([place])   Type 2 diabetes mellitus with hypoglycemia without coma ([place])   Failure to thrive in adult   Altered mental status   Goals of care, counseling/discussion   Discharge Instructions:  Activity:  As tolerated with Full fall precautions use walker/cane & assistance as needed   Discharge Instructions     Diet - low sodium heart healthy   Complete by: As directed    Discharge wound care:   Complete by: As directed    Wound care to Stage 2 pressure injury to sacrum:  cleanse with NS, pat dry. Cover open area with small piece of xeroform gauze Kellie Simmering # [id number]), top with dry gauze 2x2 and top with silicone foam. Change xeroform daily, may reuse silicone foam for up to 3 days. Change PRN soiling.   Increase activity slowly   Complete by: As directed       Allergies as of 07/05/2021       Reactions   Ciprofloxacin Rash, Other (See  Comments)   SYNCOPE    Lactose Intolerance (gi) Diarrhea   Shellfish Allergy Anaphylaxis   Sulfa Antibiotics Other (See Comments)   SYNCOPE DIZZINESS    American Cockroach    Cat Hair Extract    Dust Mite Extract    Molds & Smuts         Medication List     STOP taking these medications    (feeding supplement) PROSource Plus liquid   acetaminophen 325 MG tablet Commonly known as: TYLENOL   albuterol 108 (90 Base) MCG/ACT inhaler Commonly known as: Ventolin  HFA   ascorbic acid 1000 MG tablet Commonly known as: VITAMIN C   azelastine 0.1 % nasal spray Commonly known as: ASTELIN   BLUE-EMU HEMP EX   budesonide-formoterol 160-4.5 MCG/ACT inhaler Commonly known as: SYMBICORT   colchicine 0.6 MG tablet   Entresto 24-26 MG Generic drug: sacubitril-valsartan   EPINEPHrine 0.3 mg/0.3 mL Soaj injection Commonly known as: EPI-PEN   feeding supplement (GLUCERNA SHAKE) Liqd   fluticasone 50 MCG/ACT nasal spray Commonly known as: Flonase   fluticasone-salmeterol 500-50 MCG/ACT Aepb Commonly known as: ADVAIR   furosemide 40 MG tablet Commonly known as: LASIX   HYDROcodone-acetaminophen 5-325 MG tablet Commonly known as: NORCO/VICODIN   ketotifen 0.025 % ophthalmic solution Commonly known as: ZADITOR   lactase 3000 units tablet Commonly known as: LACTAID   Lactase 9000 units Tabs   loperamide 2 MG capsule Commonly known as: IMODIUM   metoprolol succinate 25 MG 24 hr tablet Commonly known as: Toprol XL   mirtazapine 15 MG tablet Commonly known as: REMERON   montelukast 10 MG tablet Commonly known as: Singulair   multivitamin with minerals Tabs tablet   Olopatadine HCl 0.2 % Soln   pantoprazole 40 MG tablet Commonly known as: PROTONIX   polyethylene glycol 17 g packet Commonly known as: MIRALAX / GLYCOLAX   Spirulina 500 MG Tabs   Super Probiotic Digestive Caps   tamsulosin 0.4 MG Caps capsule Commonly known as: FLOMAX   vitamin C 500 MG tablet Commonly known as: ASCORBIC ACID   zinc sulfate 220 (50 Zn) MG capsule       TAKE these medications    LORazepam 1 MG tablet Commonly known as: ATIVAN Take 1 tablet (1 mg total) by mouth every 4 (four) hours as needed for anxiety.   morphine CONCENTRATE 10 MG/0.5ML Soln concentrated solution Take 0.25 mLs (5 mg total) by mouth every 2 (two) hours as needed for moderate pain (or dyspnea).   morphine 2 MG/ML injection Inject 0.5 mLs (1 mg total) into the vein  every 2 (two) hours as needed (or dyspnea).               Discharge Care Instructions  (From admission, onward)           Start     Ordered   07/05/21 0000  Discharge wound care:       Comments: Wound care to Stage 2 pressure injury to sacrum:  cleanse with NS, pat dry. Cover open area with small piece of xeroform gauze Kellie Simmering # [id number]), top with dry gauze 2x2 and top with silicone foam. Change xeroform daily, may reuse silicone foam for up to 3 days. Change PRN soiling.   07/05/21 1100            Follow-up Information     [name], NP Follow up.   Specialty: Nurse Practitioner Why: As needed Contact information: [address] [place] [address]  [phone]         [name], MD Follow up.   Specialty: Cardiology Why: As needed Contact information: [address] [place] [address] [phone]                Allergies  Allergen Reactions   Ciprofloxacin Rash and Other (See Comments)    SYNCOPE    Lactose Intolerance (Gi) Diarrhea   Shellfish Allergy Anaphylaxis   Sulfa Antibiotics Other (See Comments)    SYNCOPE DIZZINESS    American Cockroach    Cat Hair Extract    Dust Mite Extract    Molds & Smuts       Consultations: Palliative   Other Procedures/Studies: CT Angio Chest PE W/Cm &/Or Wo Cm  Result Date: 06/30/2021 CLINICAL DATA:  PE suspected, high prob EXAM: CT ANGIOGRAPHY CHEST WITH CONTRAST TECHNIQUE: Multidetector CT imaging of the chest was performed using the standard protocol during bolus administration of intravenous contrast. Multiplanar CT image reconstructions and MIPs were obtained to evaluate the vascular anatomy. CONTRAST:  80mL OMNIPAQUE IOHEXOL 350 MG/ML SOLN COMPARISON:  None. FINDINGS: Cardiovascular: Satisfactory opacification of the pulmonary arteries to the segmental level. No evidence of pulmonary embolism. Cardiomegaly with no definite increased right to left  ventricular ratio. Retrograde reflux of intravenous contrast within the inferior vena cava and hepatic veins likely due to timing of contrast given no contrast noted within the left heart. No significant pericardial effusion. The thoracic aorta is normal in caliber. Mild atherosclerotic plaque of the thoracic aorta. No coronary artery calcifications. Mediastinum/Nodes: No enlarged mediastinal, hilar, or axillary lymph nodes. Thyroid gland, trachea, and esophagus demonstrate no significant findings. Lungs/Pleura: Partial collapse of bilateral lower lobes. No focal consolidation. No pulmonary nodule. No pulmonary mass. Bilateral small to moderate pleural effusions. No pneumothorax. Upper Abdomen: No acute abnormality. Musculoskeletal: No chest wall abnormality. No suspicious lytic or blastic osseous lesions. No acute displaced fracture. Multilevel degenerative changes of the spine. Review of the MIP images confirms the above findings. IMPRESSION: 1. Bilateral distal central, segmental, subsegmental pulmonary emboli extending to all five lobes. No definite findings of right heart strain. Cannot rule out a developing pulmonary infarction within left lower lobe. 2. Bilateral small to moderate pleural effusions. 3. Partially collapsed bilateral lower lobes with bubbly ground-glass airspace opacities within the left lower lobe. Left lower lobe findings could represent infection/inflammation versus atelectasis versus developing pulmonary infarction. Recommend follow-up CT in 3 months to evaluate for resolution. These results were called by telephone at the time of interpretation on 06/30/2021 at 5:25 pm to provider Dr. Sabra Heck, who verbally acknowledged these results. Electronically Signed   By: Iven Finn M.D.   On: 06/30/2021 17:33   DG Chest Port 1 View  Result Date: 06/30/2021 CLINICAL DATA:  Hypoxia.  Additional history provided: Hypoglycemia. EXAM: PORTABLE CHEST 1 VIEW COMPARISON:  Prior chest radiographs  06/28/2021 and earlier. FINDINGS: Cardiomegaly, unchanged. Aortic atherosclerosis. Hazy opacity within the mid to lower lung fields bilaterally compatible with small to, at most moderate, bilateral pleural effusions. Associated bibasilar atelectasis and/or consolidation. No evidence of pneumothorax. No acute bony abnormality identified. IMPRESSION: Cardiomegaly, unchanged. Bilateral small to, at most moderate, bilateral pleural effusions. Associated bibasilar atelectasis and/or consolidation. Aortic Atherosclerosis (ICD10-I70.0). Electronically Signed   By: Kellie Simmering D.O.   On: 06/30/2021 14:42   DG Chest Portable 1 View  Result Date: 06/28/2021 CLINICAL DATA:  Hypoglycemia, altered mental status. EXAM: PORTABLE CHEST 1 VIEW COMPARISON:  April 15, 2021. FINDINGS:  Stable cardiomegaly. Increased bibasilar opacities are noted concerning for edema or atelectasis with associated pleural effusions. Bony thorax is unremarkable. IMPRESSION: Increased bibasilar opacities are noted concerning for edema or atelectasis with associated pleural effusions. Electronically Signed   By: Marijo Conception M.D.   On: 06/28/2021 09:31   DG Tibia/Fibula Left Port  Result Date: 06/30/2021 CLINICAL DATA:  Sepsis. EXAM: PORTABLE LEFT TIBIA AND FIBULA - 2 VIEW COMPARISON:  None. FINDINGS: Left below-knee amputation present. No acute fracture or dislocation. No cortical erosion. Soft tissues are within normal limits. Joint spaces are well maintained. Peripheral vascular calcifications are present. IMPRESSION: 1. Left below the knee amputation. 2. No acute bony abnormality. Electronically Signed   By: Ronney Asters M.D.   On: 06/30/2021 19:36   ECHOCARDIOGRAM COMPLETE  Result Date: 07/01/2021    ECHOCARDIOGRAM REPORT   Patient Name:   Stephen Whitaker Date of Exam: 07/01/2021 Medical Rec #:  [id number]          Height:       74.0 in Accession #:    [id number]         Weight:       157.4 lb Date of Birth:  [birth year]           BSA:          1.963 m Patient Age:    37 years           BP:           108/85 mmHg Patient Gender: M                  HR:           93 bpm. Exam Location:  Inpatient Procedure: 2D Echo, Cardiac Doppler, Color Doppler and Intracardiac            Opacification Agent Indications:    CHF  History:        Patient has prior history of Echocardiogram examinations, most                 recent 12/08/2020. CHF, Signs/Symptoms:Syncope; Risk                 Factors:Hypertension. Anemia.  Sonographer:    Merrie Roof RDCS Referring Phys: [id number] [place]  1. Large LV thrombus burden, multiple large apical clot foci, largest 2 x 1 cm. . Left ventricular ejection fraction, by estimation, is <20%. The left ventricle has severely decreased function. The left ventricle demonstrates global hypokinesis. The left ventricular internal cavity size was moderately dilated. Left ventricular diastolic parameters are consistent with Grade III diastolic dysfunction (restrictive).  2. Right ventricular systolic function is normal. The right ventricular size is moderately enlarged.  3. Left atrial size was severely dilated.  4. Right atrial size was severely dilated.  5. The mitral valve is normal in structure. Mild to moderate mitral valve regurgitation. No evidence of mitral stenosis.  6. Tricuspid valve regurgitation is severe.  7. The aortic valve is calcified. There is moderate calcification of the aortic valve. Aortic valve regurgitation is mild. Aortic valve sclerosis/calcification is present, without any evidence of aortic stenosis.  8. The inferior vena cava is dilated in size with <50% respiratory variability, suggesting right atrial pressure of 15 mmHg. Comparison(s): LV thrombus burden is new when compared to prior. Discussed with primary team. On anticoagulation. FINDINGS  Left Ventricle: Large LV thrombus burden, multiple large apical clot foci, largest 2 x 1 cm. Left ventricular ejection  fraction, by estimation, is  <20%. The left ventricle has severely decreased function. The left ventricle demonstrates global hypokinesis. The left ventricular internal cavity size was moderately dilated. There is no left ventricular hypertrophy. Left ventricular diastolic parameters are consistent with Grade III diastolic dysfunction (restrictive). Right Ventricle: The right ventricular size is moderately enlarged. No increase in right ventricular wall thickness. Right ventricular systolic function is normal. Left Atrium: Left atrial size was severely dilated. Right Atrium: Right atrial size was severely dilated. Pericardium: There is no evidence of pericardial effusion. Mitral Valve: The mitral valve is normal in structure. Mild to moderate mitral valve regurgitation. No evidence of mitral valve stenosis. Tricuspid Valve: The tricuspid valve is normal in structure. Tricuspid valve regurgitation is severe. No evidence of tricuspid stenosis. Aortic Valve: The aortic valve is calcified. There is moderate calcification of the aortic valve. Aortic valve regurgitation is mild. Aortic valve sclerosis/calcification is present, without any evidence of aortic stenosis. Aortic valve mean gradient measures 2.0 mmHg. Aortic valve peak gradient measures 3.7 mmHg. Aortic valve area, by VTI measures 1.13 cm. Pulmonic Valve: The pulmonic valve was normal in structure. Pulmonic valve regurgitation is not visualized. No evidence of pulmonic stenosis. Aorta: The aortic root is normal in size and structure. Venous: The inferior vena cava is dilated in size with less than 50% respiratory variability, suggesting right atrial pressure of 15 mmHg. IAS/Shunts: No atrial level shunt detected by color flow Doppler.  LEFT VENTRICLE PLAX 2D LVIDd:         5.40 cm LVIDs:         5.10 cm LV PW:         1.00 cm LV IVS:        0.70 cm LVOT diam:     2.00 cm LV SV:         17 LV SV Index:   9 LVOT Area:     3.14 cm  RIGHT VENTRICLE            IVC RV Basal diam:  4.90 cm     IVC diam: 2.30 cm RV Mid diam:    3.70 cm RV S prime:     7.07 cm/s TAPSE (M-mode): 1.3 cm LEFT ATRIUM              Index        RIGHT ATRIUM           Index LA diam:        4.40 cm  2.24 cm/m   RA Area:     35.10 cm LA Vol (A2C):   159.0 ml 80.99 ml/m  RA Volume:   153.00 ml 77.94 ml/m LA Vol (A4C):   192.0 ml 97.80 ml/m LA Biplane Vol: 178.0 ml 90.67 ml/m  AORTIC VALVE AV Area (Vmax):    1.22 cm AV Area (Vmean):   1.11 cm AV Area (VTI):     1.13 cm AV Vmax:           96.40 cm/s AV Vmean:          65.200 cm/s AV VTI:            0.154 m AV Peak Grad:      3.7 mmHg AV Mean Grad:      2.0 mmHg LVOT Vmax:         37.30 cm/s LVOT Vmean:        23.100 cm/s LVOT VTI:          0.055 m LVOT/AV  VTI ratio: 0.36  AORTA Ao Root diam: 2.90 cm MITRAL VALVE               TRICUSPID VALVE MV Area (PHT): 7.02 cm    TR Peak grad:   31.4 mmHg MV Decel Time: 108 msec    TR Vmax:        280.00 cm/s MV E velocity: 45.80 cm/s MV A velocity: 25.70 cm/s  SHUNTS MV E/A ratio:  1.78        Systemic VTI:  0.06 m                            Systemic Diam: 2.00 cm Candee Furbish MD Electronically signed by Candee Furbish MD Signature Date/Time: 07/01/2021/5:49:04 PM    Final      TODAY-DAY OF DISCHARGE:  Subjective:   Dionis Tuminello today has no headache,no chest abdominal pain,no new weakness tingling or numbness, feels much better wants to go home today.  Objective:   Blood pressure (!) 113/91, pulse 93, temperature 98 F (36.7 C), temperature source Oral, resp. rate 19, height 6\' 2"  (1.88 m), weight 74.5 kg, SpO2 97 %.  Intake/Output Summary (Last 24 hours) at 07/05/2021 1101 Last data filed at 07/05/2021 0421 Gross per 24 hour  Intake --  Output 200 ml  Net -200 ml   Filed Weights   07/01/21 0500 07/02/21 0500 07/03/21 0517  Weight: 71.4 kg 71 kg 74.5 kg    Exam: Awake Alert, Oriented *3, No new F.N deficits, Normal affect [place].AT,PERRAL Supple Neck,No JVD, No cervical lymphadenopathy appriciated.  Symmetrical  Chest wall movement, Good air movement bilaterally, CTAB RRR,No Gallops,Rubs or new Murmurs, No Parasternal Heave +ve B.Sounds, Abd Soft, Non tender, No organomegaly appriciated, No rebound -guarding or rigidity. No Cyanosis, Clubbing or edema, No new Rash or bruise   PERTINENT RADIOLOGIC STUDIES: No results found.   PERTINENT LAB RESULTS: CBC: Recent Labs    07/03/21 0457  WBC 8.1  HGB 12.7*  HCT 41.9  PLT 172   CMET CMP     Component Value Date/Time   NA 132 (L) 07/02/2021 0158   NA 141 03/24/2021 1608   K 4.9 07/02/2021 0158   CL 102 07/02/2021 0158   CO2 17 (L) 07/02/2021 0158   GLUCOSE 101 (H) 07/02/2021 0158   BUN 53 (H) 07/02/2021 0158   BUN 22 03/24/2021 1608   CREATININE 1.04 07/02/2021 0158   CREATININE 0.92 03/10/2018 1503   CREATININE 1.30 (H) 09/20/2016 1440   CALCIUM 8.0 (L) 07/02/2021 0158   PROT 6.3 (L) 07/02/2021 0158   PROT 7.4 06/26/2019 1035   ALBUMIN 2.0 (L) 07/02/2021 0158   ALBUMIN 3.9 06/26/2019 1035   AST QUANTITY NOT SUFFICIENT, UNABLE TO PERFORM TEST 07/02/2021 0158   AST 66 (H) 03/10/2018 1503   ALT QUANTITY NOT SUFFICIENT, UNABLE TO PERFORM TEST 07/02/2021 0158   ALT 54 (H) 03/10/2018 1503   ALKPHOS 127 (H) 07/02/2021 0158   BILITOT QUANTITY NOT SUFFICIENT, UNABLE TO PERFORM TEST 07/02/2021 0158   BILITOT 0.3 06/26/2019 1035   BILITOT 0.3 03/10/2018 1503   GFRNONAA >60 07/02/2021 0158   GFRNONAA >60 03/10/2018 1503   GFRNONAA 84 08/10/2014 1023   GFRAA 101 06/26/2019 1035   GFRAA >60 03/10/2018 1503   GFRAA >89 08/10/2014 1023    GFR Estimated Creatinine Clearance: 73.6 mL/min (by C-G formula based on SCr of 1.04 mg/dL). No results for input(s): LIPASE, AMYLASE in the last  72 hours. No results for input(s): CKTOTAL, CKMB, CKMBINDEX, TROPONINI in the last 72 hours. Invalid input(s): POCBNP No results for input(s): DDIMER in the last 72 hours. No results for input(s): HGBA1C in the last 72 hours. No results for input(s): CHOL,  HDL, LDLCALC, TRIG, CHOLHDL, LDLDIRECT in the last 72 hours. No results for input(s): TSH, T4TOTAL, T3FREE, THYROIDAB in the last 72 hours.  Invalid input(s): FREET3 No results for input(s): VITAMINB12, FOLATE, FERRITIN, TIBC, IRON, RETICCTPCT in the last 72 hours. Coags: No results for input(s): INR in the last 72 hours.  Invalid input(s): PT Microbiology: Recent Results (from the past 240 hour(s))  Urine Culture     Status: Abnormal   Collection Time: 06/28/21  9:13 AM   Specimen: Urine, Catheterized  Result Value Ref Range Status   Specimen Description URINE, CATHETERIZED  Final   Special Requests   Final    NONE Performed at [facility], [address] [place], [place] [address]    Culture (A)  Final    >=100,000 COLONIES/mL CITROBACTER FREUNDII >=100,000 COLONIES/mL PSEUDOMONAS AERUGINOSA    Report Status 06/30/2021 FINAL  Final   Organism ID, Bacteria CITROBACTER FREUNDII (A)  Final   Organism ID, Bacteria PSEUDOMONAS AERUGINOSA (A)  Final      Susceptibility   Citrobacter freundii - MIC*    CEFAZOLIN >=64 RESISTANT Resistant     CEFEPIME <=0.12 SENSITIVE Sensitive     CEFTRIAXONE <=0.25 SENSITIVE Sensitive     CIPROFLOXACIN 1 RESISTANT Resistant     GENTAMICIN <=1 SENSITIVE Sensitive     IMIPENEM <=0.25 SENSITIVE Sensitive     NITROFURANTOIN <=16 SENSITIVE Sensitive     TRIMETH/SULFA >=320 RESISTANT Resistant     PIP/TAZO <=4 SENSITIVE Sensitive     * >=100,000 COLONIES/mL CITROBACTER FREUNDII   Pseudomonas aeruginosa - MIC*    CEFTAZIDIME 4 SENSITIVE Sensitive     CIPROFLOXACIN <=0.25 SENSITIVE Sensitive     GENTAMICIN <=1 SENSITIVE Sensitive     IMIPENEM <=0.25 SENSITIVE Sensitive     PIP/TAZO 8 SENSITIVE Sensitive     CEFEPIME 4 SENSITIVE Sensitive     * >=100,000 COLONIES/mL PSEUDOMONAS AERUGINOSA  Resp Panel by RT-PCR (Flu A&B, Covid) Nasopharyngeal Swab     Status: None   Collection Time: 06/30/21  2:00 PM   Specimen: Nasopharyngeal Swab;  Nasopharyngeal(NP) swabs in vial transport medium  Result Value Ref Range Status   SARS Coronavirus 2 by RT PCR NEGATIVE NEGATIVE Final    Comment: (NOTE) SARS-CoV-2 target nucleic acids are NOT DETECTED.  The SARS-CoV-2 RNA is generally detectable in upper respiratory specimens during the acute phase of infection. The lowest concentration of SARS-CoV-2 viral copies this assay can detect is 138 copies/mL. A negative result does not preclude SARS-Cov-2 infection and should not be used as the sole basis for treatment or other patient management decisions. A negative result may occur with  improper specimen collection/handling, submission of specimen other than nasopharyngeal swab, presence of viral mutation(s) within the areas targeted by this assay, and inadequate number of viral copies(<138 copies/mL). A negative result must be combined with clinical observations, patient history, and epidemiological information. The expected result is Negative.  Fact Sheet for Patients:  EntrepreneurPulse.com.au  Fact Sheet for Healthcare Providers:  IncredibleEmployment.be  This test is no t yet approved or cleared by the [place] FDA and  has been authorized for detection and/or diagnosis of SARS-CoV-2 by FDA under an Emergency Use Authorization (EUA). This EUA will remain  in effect (  meaning this test can be used) for the duration of the COVID-19 declaration under Section 564(b)(1) of the Act, 21 U.S.C.section 360bbb-3(b)(1), unless the authorization is terminated  or revoked sooner.       Influenza A by PCR NEGATIVE NEGATIVE Final   Influenza B by PCR NEGATIVE NEGATIVE Final    Comment: (NOTE) The Xpert Xpress SARS-CoV-2/FLU/RSV plus assay is intended as an aid in the diagnosis of influenza from Nasopharyngeal swab specimens and should not be used as a sole basis for treatment. Nasal washings and aspirates are unacceptable for Xpert Xpress  SARS-CoV-2/FLU/RSV testing.  Fact Sheet for Patients: EntrepreneurPulse.com.au  Fact Sheet for Healthcare Providers: IncredibleEmployment.be  This test is not yet approved or cleared by the [place] FDA and has been authorized for detection and/or diagnosis of SARS-CoV-2 by FDA under an Emergency Use Authorization (EUA). This EUA will remain in effect (meaning this test can be used) for the duration of the COVID-19 declaration under Section 564(b)(1) of the Act, 21 U.S.C. section 360bbb-3(b)(1), unless the authorization is terminated or revoked.  Performed at [facility], [place] [address], [place], [place] [address]   Urine Culture     Status: Abnormal   Collection Time: 06/30/21  2:13 PM   Specimen: In/Out Cath Urine  Result Value Ref Range Status   Specimen Description IN/OUT CATH URINE  Final   Special Requests   Final    Normal Performed at [facility], [place] [address], [place], [place] [address]    Culture >=100,000 COLONIES/mL PSEUDOMONAS AERUGINOSA (A)  Final   Report Status 07/03/2021 FINAL  Final   Organism ID, Bacteria PSEUDOMONAS AERUGINOSA (A)  Final      Susceptibility   Pseudomonas aeruginosa - MIC*    CEFTAZIDIME 4 SENSITIVE Sensitive     CIPROFLOXACIN <=0.25 SENSITIVE Sensitive     GENTAMICIN <=1 SENSITIVE Sensitive     IMIPENEM <=0.25 SENSITIVE Sensitive     PIP/TAZO 8 SENSITIVE Sensitive     CEFEPIME 2 SENSITIVE Sensitive     * >=100,000 COLONIES/mL PSEUDOMONAS AERUGINOSA  Blood Culture (routine x 2)     Status: None   Collection Time: 06/30/21  2:30 PM   Specimen: BLOOD LEFT WRIST  Result Value Ref Range Status   Specimen Description BLOOD LEFT WRIST  Final   Special Requests   Final    BOTTLES DRAWN AEROBIC AND ANAEROBIC Blood Culture results may not be optimal due to an inadequate volume of blood received in culture bottles   Culture   Final    NO GROWTH 5 DAYS Performed at [facility], [place] [address] [place], [place] [address]    Report Status 07/05/2021 FINAL  Final  Blood Culture (routine x 2)     Status: None   Collection Time: 06/30/21  6:19 PM   Specimen: BLOOD RIGHT FOREARM  Result Value Ref Range Status   Specimen Description BLOOD RIGHT FOREARM  Final   Special Requests   Final    BOTTLES DRAWN AEROBIC AND ANAEROBIC Blood Culture results may not be optimal due to an inadequate volume of blood received in culture bottles   Culture   Final    NO GROWTH 5 DAYS Performed at [facility], [place] [address], [place],  [address]    Report Status 07/05/2021 FINAL  Final  MRSA Next Gen by PCR, Nasal     Status: None   Collection Time: 07/01/21  1:44 AM   Specimen: Nasal  Mucosa; Nasal Swab  Result Value Ref Range Status   MRSA by PCR Next Gen NOT DETECTED NOT DETECTED Final    Comment: (NOTE) The GeneXpert MRSA Assay (FDA approved for NASAL specimens only), is one component of a comprehensive MRSA colonization surveillance program. It is not intended to diagnose MRSA infection nor to guide or monitor treatment for MRSA infections. Test performance is not FDA approved in patients less than 49 years old. Performed at [facility], [place] [address] [place],  [address]     FURTHER DISCHARGE INSTRUCTIONS:  Get Medicines reviewed and adjusted: Please take all your medications with you for your next visit with your Primary MD  Laboratory/radiological data: Please request your Primary MD to go over all hospital tests and procedure/radiological results at the follow up, please ask your Primary MD to get all Hospital records sent to his/her office.  In some cases, they will be blood work, cultures and biopsy results pending at the time of your discharge. Please request that your primary care M.D. goes through all the records of your hospital data and follows up on these results.  Also Note the following: If you experience worsening of your  admission symptoms, develop shortness of breath, life threatening emergency, suicidal or homicidal thoughts you must seek medical attention immediately by calling 911 or calling your MD immediately  if symptoms less severe.  You must read complete instructions/literature along with all the possible adverse reactions/side effects for all the Medicines you take and that have been prescribed to you. Take any new Medicines after you have completely understood and accpet all the possible adverse reactions/side effects.   Do not drive when taking Pain medications or sleeping medications (Benzodaizepines)  Do not take more than prescribed Pain, Sleep and Anxiety Medications. It is not advisable to combine anxiety,sleep and pain medications without talking with your primary care practitioner  Special Instructions: If you have smoked or chewed Tobacco  in the last 2 yrs please stop smoking, stop any regular Alcohol  and or any Recreational drug use.  Wear Seat belts while driving.  Please note: You were cared for by a hospitalist during your hospital stay. Once you are discharged, your primary care physician will handle any further medical issues. Please note that NO REFILLS for any discharge medications will be authorized once you are discharged, as it is imperative that you return to your primary care physician (or establish a relationship with a primary care physician if you do not have one) for your post hospital discharge needs so that they can reassess your need for medications and monitor your lab values.  Total Time spent coordinating discharge including counseling, education and face to face time equals 35 minutes.  Signed: Orvilla Truett 07/05/2021 11:01 AM

## 2021-08-07 ENCOUNTER — Ambulatory Visit (HOSPITAL_COMMUNITY): Admission: RE | Admit: 2021-08-07 | Payer: Medicaid Other | Source: Ambulatory Visit

## 2021-08-07 ENCOUNTER — Encounter (HOSPITAL_COMMUNITY): Payer: BC Managed Care – PPO | Admitting: Cardiology

## 2021-09-20 ENCOUNTER — Ambulatory Visit: Payer: BC Managed Care – PPO | Admitting: Family

## 2021-09-26 ENCOUNTER — Ambulatory Visit: Payer: BC Managed Care – PPO | Admitting: Family

## 2021-10-18 ENCOUNTER — Encounter (HOSPITAL_COMMUNITY): Payer: Self-pay | Admitting: Radiology

## 2022-01-01 NOTE — Telephone Encounter (Signed)
error 

## 2022-01-01 NOTE — Telephone Encounter (Signed)
err

## 2022-06-29 LAB — PROINSULIN/INSULIN RATIO
Insulin: 17 u[IU]/mL
Proinsulin: 7.7 pmol/L

## 2022-09-28 ENCOUNTER — Encounter: Payer: Self-pay | Admitting: Internal Medicine
